# Patient Record
Sex: Male | Born: 1950
Health system: Southern US, Community
[De-identification: ages and names within clinical notes are randomized; demographics above are authoritative.]

## PROBLEM LIST (undated history)

## (undated) DIAGNOSIS — E119 Type 2 diabetes mellitus without complications: Secondary | ICD-10-CM

## (undated) DIAGNOSIS — I714 Abdominal aortic aneurysm, without rupture: Secondary | ICD-10-CM

## (undated) DIAGNOSIS — M86272 Subacute osteomyelitis, left ankle and foot: Secondary | ICD-10-CM

## (undated) DIAGNOSIS — J449 Chronic obstructive pulmonary disease, unspecified: Secondary | ICD-10-CM

## (undated) DIAGNOSIS — E1169 Type 2 diabetes mellitus with other specified complication: Secondary | ICD-10-CM

## (undated) DIAGNOSIS — S2239XA Fracture of one rib, unspecified side, initial encounter for closed fracture: Secondary | ICD-10-CM

## (undated) DIAGNOSIS — F329 Major depressive disorder, single episode, unspecified: Secondary | ICD-10-CM

## (undated) DIAGNOSIS — I10 Essential (primary) hypertension: Secondary | ICD-10-CM

## (undated) DIAGNOSIS — I219 Acute myocardial infarction, unspecified: Secondary | ICD-10-CM

## (undated) DIAGNOSIS — M1990 Unspecified osteoarthritis, unspecified site: Secondary | ICD-10-CM

## (undated) DIAGNOSIS — G4730 Sleep apnea, unspecified: Secondary | ICD-10-CM

## (undated) DIAGNOSIS — F419 Anxiety disorder, unspecified: Secondary | ICD-10-CM

## (undated) DIAGNOSIS — I739 Peripheral vascular disease, unspecified: Secondary | ICD-10-CM

## (undated) DIAGNOSIS — Z9889 Other specified postprocedural states: Secondary | ICD-10-CM

## (undated) DIAGNOSIS — R112 Nausea with vomiting, unspecified: Secondary | ICD-10-CM

## (undated) DIAGNOSIS — E7800 Pure hypercholesterolemia, unspecified: Secondary | ICD-10-CM

## (undated) DIAGNOSIS — F32A Depression, unspecified: Secondary | ICD-10-CM

## (undated) DIAGNOSIS — I509 Heart failure, unspecified: Secondary | ICD-10-CM

## (undated) DIAGNOSIS — K219 Gastro-esophageal reflux disease without esophagitis: Secondary | ICD-10-CM

## (undated) DIAGNOSIS — R569 Unspecified convulsions: Secondary | ICD-10-CM

## (undated) DIAGNOSIS — I639 Cerebral infarction, unspecified: Secondary | ICD-10-CM

## (undated) HISTORY — DX: Subacute osteomyelitis, left ankle and foot: M86.272

## (undated) HISTORY — PX: KNEE SURGERY: SHX244

## (undated) HISTORY — PX: FINGER AMPUTATION: SHX636

## (undated) HISTORY — DX: Fracture of one rib, unspecified side, initial encounter for closed fracture: S22.39XA

## (undated) HISTORY — DX: Peripheral vascular disease, unspecified: I73.9

## (undated) HISTORY — PX: SHOULDER SURGERY: SHX246

## (undated) HISTORY — DX: Type 2 diabetes mellitus with other specified complication: E11.69

## (undated) HISTORY — PX: APPENDECTOMY: SHX54

---
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
## 2002-10-21 DIAGNOSIS — R809 Proteinuria, unspecified: Secondary | ICD-10-CM | POA: Insufficient documentation

## 2002-10-21 HISTORY — DX: Proteinuria, unspecified: R80.9

## 2004-10-08 DIAGNOSIS — IMO0002 Reserved for concepts with insufficient information to code with codable children: Secondary | ICD-10-CM

## 2004-10-08 HISTORY — DX: Reserved for concepts with insufficient information to code with codable children: IMO0002

## 2005-01-07 DIAGNOSIS — F32A Depression, unspecified: Secondary | ICD-10-CM | POA: Insufficient documentation

## 2008-05-02 HISTORY — PX: LOWER EXTREMITY ANGIOGRAM: SHX5955

## 2011-12-06 DIAGNOSIS — H2510 Age-related nuclear cataract, unspecified eye: Secondary | ICD-10-CM

## 2011-12-06 DIAGNOSIS — H04129 Dry eye syndrome of unspecified lacrimal gland: Secondary | ICD-10-CM | POA: Insufficient documentation

## 2011-12-06 HISTORY — DX: Dry eye syndrome of unspecified lacrimal gland: H04.129

## 2011-12-06 HISTORY — DX: Age-related nuclear cataract, unspecified eye: H25.10

## 2013-04-05 DIAGNOSIS — G8929 Other chronic pain: Secondary | ICD-10-CM

## 2013-04-05 HISTORY — DX: Other chronic pain: G89.29

## 2013-08-04 ENCOUNTER — Ambulatory Visit: Payer: Self-pay | Admitting: Cardiovascular Disease

## 2013-09-09 ENCOUNTER — Telehealth: Payer: Self-pay | Admitting: Cardiovascular Disease

## 2013-09-09 NOTE — Telephone Encounter (Signed)
Office was notified via fax that this patient cancelled his appointment with Dr. Gwenlyn Found on 08/04/13---Dr. Archie Endo fax [phone]

## 2014-02-06 DIAGNOSIS — Z0289 Encounter for other administrative examinations: Secondary | ICD-10-CM | POA: Insufficient documentation

## 2014-02-06 HISTORY — DX: Encounter for other administrative examinations: Z02.89

## 2014-09-23 DIAGNOSIS — H5710 Ocular pain, unspecified eye: Secondary | ICD-10-CM | POA: Insufficient documentation

## 2015-01-09 DIAGNOSIS — R5382 Chronic fatigue, unspecified: Secondary | ICD-10-CM | POA: Insufficient documentation

## 2015-01-09 HISTORY — DX: Chronic fatigue, unspecified: R53.82

## 2015-02-06 DIAGNOSIS — R0789 Other chest pain: Secondary | ICD-10-CM | POA: Insufficient documentation

## 2015-02-06 HISTORY — DX: Other chest pain: R07.89

## 2015-03-19 DIAGNOSIS — R634 Abnormal weight loss: Secondary | ICD-10-CM | POA: Insufficient documentation

## 2015-03-19 HISTORY — DX: Abnormal weight loss: R63.4

## 2015-12-19 DIAGNOSIS — J9600 Acute respiratory failure, unspecified whether with hypoxia or hypercapnia: Secondary | ICD-10-CM | POA: Diagnosis not present

## 2015-12-19 DIAGNOSIS — G4733 Obstructive sleep apnea (adult) (pediatric): Secondary | ICD-10-CM | POA: Diagnosis not present

## 2015-12-19 DIAGNOSIS — I5031 Acute diastolic (congestive) heart failure: Secondary | ICD-10-CM | POA: Diagnosis not present

## 2015-12-19 DIAGNOSIS — J449 Chronic obstructive pulmonary disease, unspecified: Secondary | ICD-10-CM | POA: Diagnosis not present

## 2015-12-20 DIAGNOSIS — J9600 Acute respiratory failure, unspecified whether with hypoxia or hypercapnia: Secondary | ICD-10-CM | POA: Diagnosis not present

## 2015-12-21 DIAGNOSIS — E162 Hypoglycemia, unspecified: Secondary | ICD-10-CM

## 2015-12-21 DIAGNOSIS — I2510 Atherosclerotic heart disease of native coronary artery without angina pectoris: Secondary | ICD-10-CM

## 2015-12-21 DIAGNOSIS — E669 Obesity, unspecified: Secondary | ICD-10-CM

## 2015-12-21 DIAGNOSIS — G4733 Obstructive sleep apnea (adult) (pediatric): Secondary | ICD-10-CM

## 2015-12-21 DIAGNOSIS — I5031 Acute diastolic (congestive) heart failure: Secondary | ICD-10-CM

## 2015-12-21 DIAGNOSIS — J9600 Acute respiratory failure, unspecified whether with hypoxia or hypercapnia: Secondary | ICD-10-CM | POA: Diagnosis not present

## 2015-12-21 DIAGNOSIS — F0150 Vascular dementia without behavioral disturbance: Secondary | ICD-10-CM

## 2015-12-21 DIAGNOSIS — J449 Chronic obstructive pulmonary disease, unspecified: Secondary | ICD-10-CM | POA: Diagnosis not present

## 2015-12-21 DIAGNOSIS — E119 Type 2 diabetes mellitus without complications: Secondary | ICD-10-CM

## 2017-06-11 DIAGNOSIS — R0602 Shortness of breath: Secondary | ICD-10-CM | POA: Diagnosis not present

## 2017-06-11 DIAGNOSIS — R079 Chest pain, unspecified: Secondary | ICD-10-CM | POA: Diagnosis not present

## 2017-06-12 DIAGNOSIS — R0602 Shortness of breath: Secondary | ICD-10-CM | POA: Diagnosis not present

## 2017-06-12 DIAGNOSIS — R079 Chest pain, unspecified: Secondary | ICD-10-CM | POA: Diagnosis not present

## 2018-09-28 DIAGNOSIS — I340 Nonrheumatic mitral (valve) insufficiency: Secondary | ICD-10-CM

## 2019-01-16 ENCOUNTER — Inpatient Hospital Stay (HOSPITAL_COMMUNITY)
Admission: EM | Admit: 2019-01-16 | Discharge: 2019-01-20 | DRG: 871 | Disposition: A | Discharge status: Home Health | Payer: No Typology Code available for payment source | Source: Other Acute Inpatient Hospital | Attending: Internal Medicine | Admitting: Internal Medicine

## 2019-01-16 DIAGNOSIS — I161 Hypertensive emergency: Secondary | ICD-10-CM | POA: Diagnosis present

## 2019-01-16 DIAGNOSIS — G8929 Other chronic pain: Secondary | ICD-10-CM | POA: Diagnosis present

## 2019-01-16 DIAGNOSIS — I639 Cerebral infarction, unspecified: Secondary | ICD-10-CM | POA: Diagnosis present

## 2019-01-16 DIAGNOSIS — E11622 Type 2 diabetes mellitus with other skin ulcer: Secondary | ICD-10-CM | POA: Diagnosis not present

## 2019-01-16 DIAGNOSIS — E1140 Type 2 diabetes mellitus with diabetic neuropathy, unspecified: Secondary | ICD-10-CM | POA: Diagnosis present

## 2019-01-16 DIAGNOSIS — L089 Local infection of the skin and subcutaneous tissue, unspecified: Secondary | ICD-10-CM | POA: Diagnosis not present

## 2019-01-16 DIAGNOSIS — E785 Hyperlipidemia, unspecified: Secondary | ICD-10-CM | POA: Diagnosis not present

## 2019-01-16 DIAGNOSIS — I509 Heart failure, unspecified: Secondary | ICD-10-CM | POA: Diagnosis present

## 2019-01-16 DIAGNOSIS — I2510 Atherosclerotic heart disease of native coronary artery without angina pectoris: Secondary | ICD-10-CM | POA: Diagnosis not present

## 2019-01-16 DIAGNOSIS — E11628 Type 2 diabetes mellitus with other skin complications: Secondary | ICD-10-CM | POA: Diagnosis present

## 2019-01-16 DIAGNOSIS — M86272 Subacute osteomyelitis, left ankle and foot: Secondary | ICD-10-CM | POA: Diagnosis not present

## 2019-01-16 DIAGNOSIS — E1169 Type 2 diabetes mellitus with other specified complication: Secondary | ICD-10-CM

## 2019-01-16 DIAGNOSIS — E1165 Type 2 diabetes mellitus with hyperglycemia: Secondary | ICD-10-CM | POA: Diagnosis present

## 2019-01-16 DIAGNOSIS — E871 Hypo-osmolality and hyponatremia: Secondary | ICD-10-CM | POA: Diagnosis not present

## 2019-01-16 DIAGNOSIS — R05 Cough: Secondary | ICD-10-CM | POA: Diagnosis present

## 2019-01-16 DIAGNOSIS — A419 Sepsis, unspecified organism: Principal | ICD-10-CM | POA: Diagnosis present

## 2019-01-16 DIAGNOSIS — Z9282 Status post administration of tPA (rtPA) in a different facility within the last 24 hours prior to admission to current facility: Secondary | ICD-10-CM

## 2019-01-16 DIAGNOSIS — I252 Old myocardial infarction: Secondary | ICD-10-CM

## 2019-01-16 DIAGNOSIS — M869 Osteomyelitis, unspecified: Secondary | ICD-10-CM

## 2019-01-16 DIAGNOSIS — E11621 Type 2 diabetes mellitus with foot ulcer: Secondary | ICD-10-CM | POA: Diagnosis present

## 2019-01-16 DIAGNOSIS — Z794 Long term (current) use of insulin: Secondary | ICD-10-CM

## 2019-01-16 DIAGNOSIS — Z23 Encounter for immunization: Secondary | ICD-10-CM | POA: Diagnosis not present

## 2019-01-16 DIAGNOSIS — F0390 Unspecified dementia without behavioral disturbance: Secondary | ICD-10-CM | POA: Diagnosis not present

## 2019-01-16 DIAGNOSIS — G4730 Sleep apnea, unspecified: Secondary | ICD-10-CM | POA: Diagnosis present

## 2019-01-16 DIAGNOSIS — J449 Chronic obstructive pulmonary disease, unspecified: Secondary | ICD-10-CM | POA: Diagnosis present

## 2019-01-16 DIAGNOSIS — E669 Obesity, unspecified: Secondary | ICD-10-CM | POA: Diagnosis present

## 2019-01-16 DIAGNOSIS — Z79899 Other long term (current) drug therapy: Secondary | ICD-10-CM

## 2019-01-16 DIAGNOSIS — Z87891 Personal history of nicotine dependence: Secondary | ICD-10-CM

## 2019-01-16 DIAGNOSIS — G4733 Obstructive sleep apnea (adult) (pediatric): Secondary | ICD-10-CM | POA: Diagnosis present

## 2019-01-16 DIAGNOSIS — K219 Gastro-esophageal reflux disease without esophagitis: Secondary | ICD-10-CM | POA: Diagnosis present

## 2019-01-16 DIAGNOSIS — L97529 Non-pressure chronic ulcer of other part of left foot with unspecified severity: Secondary | ICD-10-CM | POA: Diagnosis not present

## 2019-01-16 DIAGNOSIS — I6522 Occlusion and stenosis of left carotid artery: Secondary | ICD-10-CM | POA: Diagnosis present

## 2019-01-16 DIAGNOSIS — Z6828 Body mass index (BMI) 28.0-28.9, adult: Secondary | ICD-10-CM

## 2019-01-16 DIAGNOSIS — R32 Unspecified urinary incontinence: Secondary | ICD-10-CM | POA: Diagnosis not present

## 2019-01-16 DIAGNOSIS — Z7902 Long term (current) use of antithrombotics/antiplatelets: Secondary | ICD-10-CM

## 2019-01-16 DIAGNOSIS — Z20828 Contact with and (suspected) exposure to other viral communicable diseases: Secondary | ICD-10-CM | POA: Diagnosis not present

## 2019-01-16 DIAGNOSIS — I714 Abdominal aortic aneurysm, without rupture: Secondary | ICD-10-CM | POA: Diagnosis present

## 2019-01-16 DIAGNOSIS — R569 Unspecified convulsions: Secondary | ICD-10-CM | POA: Diagnosis present

## 2019-01-16 DIAGNOSIS — E7800 Pure hypercholesterolemia, unspecified: Secondary | ICD-10-CM | POA: Diagnosis present

## 2019-01-16 DIAGNOSIS — E1151 Type 2 diabetes mellitus with diabetic peripheral angiopathy without gangrene: Secondary | ICD-10-CM | POA: Diagnosis present

## 2019-01-16 DIAGNOSIS — I110 Hypertensive heart disease with heart failure: Secondary | ICD-10-CM | POA: Diagnosis present

## 2019-01-16 DIAGNOSIS — R4701 Aphasia: Secondary | ICD-10-CM | POA: Diagnosis not present

## 2019-01-16 DIAGNOSIS — I739 Peripheral vascular disease, unspecified: Secondary | ICD-10-CM

## 2019-01-16 DIAGNOSIS — L97509 Non-pressure chronic ulcer of other part of unspecified foot with unspecified severity: Secondary | ICD-10-CM

## 2019-01-16 DIAGNOSIS — D649 Anemia, unspecified: Secondary | ICD-10-CM | POA: Diagnosis present

## 2019-01-16 DIAGNOSIS — Z7982 Long term (current) use of aspirin: Secondary | ICD-10-CM

## 2019-01-16 DIAGNOSIS — R059 Cough, unspecified: Secondary | ICD-10-CM

## 2019-01-16 HISTORY — DX: Pure hypercholesterolemia, unspecified: E78.00

## 2019-01-16 HISTORY — DX: Essential (primary) hypertension: I10

## 2019-01-16 HISTORY — DX: Type 2 diabetes mellitus without complications: E11.9

## 2019-01-16 HISTORY — DX: Chronic obstructive pulmonary disease, unspecified: J44.9

## 2019-01-16 HISTORY — DX: Gastro-esophageal reflux disease without esophagitis: K21.9

## 2019-01-16 HISTORY — DX: Sleep apnea, unspecified: G47.30

## 2019-01-16 NOTE — ED Triage Notes (Signed)
Pt is being transferred from [facility] post Code Stroke. When EMS arrived to the  Patient, they were A/Ox4. At the hospital, patient was not A/O, and was having slurred speech and was not making out complete sentences. CT showed no occlusions and TPA was administered. Rectal temp at faciltiy was 104F and 650mg  Tylenol given Rectal. 102F prior to leaving reported by [facility]. Patient is now waiting for MRI.   Maintain BP 150-180Systolic  COVID Negative  Diabetic Ulcer to the Right Foot CT showed Anerysum behind Kidney after TPA Given

## 2019-01-16 NOTE — ED Notes (Signed)
Please call wife Danny Carpenter  @ [phone] a status update--Danny Carpenter

## 2019-01-17 ENCOUNTER — Inpatient Hospital Stay (HOSPITAL_COMMUNITY): Payer: No Typology Code available for payment source

## 2019-01-17 ENCOUNTER — Encounter (HOSPITAL_COMMUNITY): Payer: Self-pay | Admitting: Neurology

## 2019-01-17 ENCOUNTER — Inpatient Hospital Stay (HOSPITAL_COMMUNITY): Payer: Medicare (Managed Care)

## 2019-01-17 DIAGNOSIS — Z20828 Contact with and (suspected) exposure to other viral communicable diseases: Secondary | ICD-10-CM | POA: Diagnosis present

## 2019-01-17 DIAGNOSIS — R4701 Aphasia: Secondary | ICD-10-CM | POA: Diagnosis present

## 2019-01-17 DIAGNOSIS — I2510 Atherosclerotic heart disease of native coronary artery without angina pectoris: Secondary | ICD-10-CM

## 2019-01-17 DIAGNOSIS — E1169 Type 2 diabetes mellitus with other specified complication: Secondary | ICD-10-CM | POA: Diagnosis present

## 2019-01-17 DIAGNOSIS — Z7902 Long term (current) use of antithrombotics/antiplatelets: Secondary | ICD-10-CM

## 2019-01-17 DIAGNOSIS — Z87891 Personal history of nicotine dependence: Secondary | ICD-10-CM

## 2019-01-17 DIAGNOSIS — Z23 Encounter for immunization: Secondary | ICD-10-CM | POA: Diagnosis not present

## 2019-01-17 DIAGNOSIS — G9349 Other encephalopathy: Secondary | ICD-10-CM | POA: Diagnosis not present

## 2019-01-17 DIAGNOSIS — I639 Cerebral infarction, unspecified: Secondary | ICD-10-CM

## 2019-01-17 DIAGNOSIS — I252 Old myocardial infarction: Secondary | ICD-10-CM | POA: Diagnosis not present

## 2019-01-17 DIAGNOSIS — M869 Osteomyelitis, unspecified: Secondary | ICD-10-CM | POA: Diagnosis present

## 2019-01-17 DIAGNOSIS — G8929 Other chronic pain: Secondary | ICD-10-CM

## 2019-01-17 DIAGNOSIS — J449 Chronic obstructive pulmonary disease, unspecified: Secondary | ICD-10-CM | POA: Diagnosis present

## 2019-01-17 DIAGNOSIS — E785 Hyperlipidemia, unspecified: Secondary | ICD-10-CM | POA: Diagnosis present

## 2019-01-17 DIAGNOSIS — E1165 Type 2 diabetes mellitus with hyperglycemia: Secondary | ICD-10-CM | POA: Diagnosis present

## 2019-01-17 DIAGNOSIS — Z794 Long term (current) use of insulin: Secondary | ICD-10-CM

## 2019-01-17 DIAGNOSIS — R569 Unspecified convulsions: Secondary | ICD-10-CM | POA: Diagnosis present

## 2019-01-17 DIAGNOSIS — I10 Essential (primary) hypertension: Secondary | ICD-10-CM

## 2019-01-17 DIAGNOSIS — E11622 Type 2 diabetes mellitus with other skin ulcer: Secondary | ICD-10-CM | POA: Diagnosis present

## 2019-01-17 DIAGNOSIS — I6522 Occlusion and stenosis of left carotid artery: Secondary | ICD-10-CM | POA: Diagnosis present

## 2019-01-17 DIAGNOSIS — R509 Fever, unspecified: Secondary | ICD-10-CM | POA: Diagnosis not present

## 2019-01-17 DIAGNOSIS — Z01810 Encounter for preprocedural cardiovascular examination: Secondary | ICD-10-CM | POA: Diagnosis not present

## 2019-01-17 DIAGNOSIS — M86272 Subacute osteomyelitis, left ankle and foot: Secondary | ICD-10-CM | POA: Diagnosis present

## 2019-01-17 DIAGNOSIS — R32 Unspecified urinary incontinence: Secondary | ICD-10-CM | POA: Diagnosis present

## 2019-01-17 DIAGNOSIS — I6389 Other cerebral infarction: Secondary | ICD-10-CM | POA: Diagnosis not present

## 2019-01-17 DIAGNOSIS — E11628 Type 2 diabetes mellitus with other skin complications: Secondary | ICD-10-CM | POA: Diagnosis present

## 2019-01-17 DIAGNOSIS — L089 Local infection of the skin and subcutaneous tissue, unspecified: Secondary | ICD-10-CM

## 2019-01-17 DIAGNOSIS — Z79899 Other long term (current) drug therapy: Secondary | ICD-10-CM

## 2019-01-17 DIAGNOSIS — A419 Sepsis, unspecified organism: Secondary | ICD-10-CM | POA: Diagnosis present

## 2019-01-17 DIAGNOSIS — E1140 Type 2 diabetes mellitus with diabetic neuropathy, unspecified: Secondary | ICD-10-CM

## 2019-01-17 DIAGNOSIS — G4733 Obstructive sleep apnea (adult) (pediatric): Secondary | ICD-10-CM | POA: Diagnosis present

## 2019-01-17 DIAGNOSIS — I739 Peripheral vascular disease, unspecified: Secondary | ICD-10-CM

## 2019-01-17 DIAGNOSIS — E11621 Type 2 diabetes mellitus with foot ulcer: Secondary | ICD-10-CM | POA: Diagnosis not present

## 2019-01-17 DIAGNOSIS — Z79891 Long term (current) use of opiate analgesic: Secondary | ICD-10-CM

## 2019-01-17 DIAGNOSIS — F0390 Unspecified dementia without behavioral disturbance: Secondary | ICD-10-CM | POA: Diagnosis present

## 2019-01-17 DIAGNOSIS — E871 Hypo-osmolality and hyponatremia: Secondary | ICD-10-CM | POA: Diagnosis present

## 2019-01-17 DIAGNOSIS — Z9862 Peripheral vascular angioplasty status: Secondary | ICD-10-CM | POA: Diagnosis not present

## 2019-01-17 DIAGNOSIS — Z7951 Long term (current) use of inhaled steroids: Secondary | ICD-10-CM

## 2019-01-17 DIAGNOSIS — I161 Hypertensive emergency: Secondary | ICD-10-CM | POA: Diagnosis present

## 2019-01-17 DIAGNOSIS — E1151 Type 2 diabetes mellitus with diabetic peripheral angiopathy without gangrene: Secondary | ICD-10-CM

## 2019-01-17 DIAGNOSIS — R05 Cough: Secondary | ICD-10-CM | POA: Diagnosis present

## 2019-01-17 DIAGNOSIS — L97529 Non-pressure chronic ulcer of other part of left foot with unspecified severity: Secondary | ICD-10-CM | POA: Diagnosis present

## 2019-01-17 HISTORY — DX: Type 2 diabetes mellitus with other skin complications: L08.9

## 2019-01-17 LAB — CBC WITH DIFFERENTIAL/PLATELET
Abs Immature Granulocytes: 0.03 10*3/uL (ref 0.00–0.07)
Basophils Absolute: 0 10*3/uL (ref 0.0–0.1)
Basophils Relative: 1 %
Eosinophils Absolute: 0 10*3/uL (ref 0.0–0.5)
Eosinophils Relative: 1 %
HCT: 37.3 % — ABNORMAL LOW (ref 39.0–52.0)
Hemoglobin: 12.5 g/dL — ABNORMAL LOW (ref 13.0–17.0)
Immature Granulocytes: 0 %
Lymphocytes Relative: 16 %
Lymphs Abs: 1.1 10*3/uL (ref 0.7–4.0)
MCH: 29.8 pg (ref 26.0–34.0)
MCHC: 33.5 g/dL (ref 30.0–36.0)
MCV: 89 fL (ref 80.0–100.0)
Monocytes Absolute: 0.6 10*3/uL (ref 0.1–1.0)
Monocytes Relative: 9 %
Neutro Abs: 5.1 10*3/uL (ref 1.7–7.7)
Neutrophils Relative %: 73 %
Platelets: 149 10*3/uL — ABNORMAL LOW (ref 150–400)
RBC: 4.19 MIL/uL — ABNORMAL LOW (ref 4.22–5.81)
RDW: 13.2 % (ref 11.5–15.5)
WBC: 6.8 10*3/uL (ref 4.0–10.5)
nRBC: 0 % (ref 0.0–0.2)

## 2019-01-17 LAB — BASIC METABOLIC PANEL
Anion gap: 13 (ref 5–15)
BUN: 16 mg/dL (ref 8–23)
CO2: 24 mmol/L (ref 22–32)
Calcium: 9 mg/dL (ref 8.9–10.3)
Chloride: 93 mmol/L — ABNORMAL LOW (ref 98–111)
Creatinine, Ser: 1.22 mg/dL (ref 0.61–1.24)
GFR calc Af Amer: >60 mL/min (ref >60)
GFR calc non Af Amer: >60 mL/min (ref >60)
Glucose, Bld: 282 mg/dL — ABNORMAL HIGH (ref 70–99)
Potassium: 3.9 mmol/L (ref 3.5–5.1)
Sodium: 130 mmol/L — ABNORMAL LOW (ref 135–145)

## 2019-01-17 LAB — URINALYSIS, COMPLETE (UACMP) WITH MICROSCOPIC
Bacteria, UA: NONE SEEN
Bilirubin Urine: NEGATIVE
Glucose, UA: >=500 mg/dL — AB
Hgb urine dipstick: NEGATIVE
Ketones, ur: NEGATIVE mg/dL
Leukocytes,Ua: NEGATIVE
Nitrite: NEGATIVE
Protein, ur: 30 mg/dL — AB
Specific Gravity, Urine: 1.009 (ref 1.005–1.030)
pH: 7 (ref 5.0–8.0)

## 2019-01-17 LAB — GLUCOSE, CAPILLARY
Glucose-Capillary: 280 mg/dL — ABNORMAL HIGH (ref 70–99)
Glucose-Capillary: 263 mg/dL — ABNORMAL HIGH (ref 70–99)
Glucose-Capillary: 179 mg/dL — ABNORMAL HIGH (ref 70–99)
Glucose-Capillary: 139 mg/dL — ABNORMAL HIGH (ref 70–99)
Glucose-Capillary: 186 mg/dL — ABNORMAL HIGH (ref 70–99)
Glucose-Capillary: 267 mg/dL — ABNORMAL HIGH (ref 70–99)

## 2019-01-17 LAB — ECHOCARDIOGRAM COMPLETE
Height: 73 in
Weight: 3241.64 oz

## 2019-01-17 LAB — LIPID PANEL
Cholesterol: 131 mg/dL (ref 0–200)
HDL: 35 mg/dL — ABNORMAL LOW (ref >40)
LDL Cholesterol: 70 mg/dL (ref 0–99)
Total CHOL/HDL Ratio: 3.7 RATIO
Triglycerides: 130 mg/dL (ref <150)
VLDL: 26 mg/dL (ref 0–40)

## 2019-01-17 LAB — RAPID URINE DRUG SCREEN, HOSP PERFORMED
Amphetamines: NOT DETECTED
Barbiturates: NOT DETECTED
Benzodiazepines: NOT DETECTED
Cocaine: NOT DETECTED
Opiates: POSITIVE — AB
Tetrahydrocannabinol: NOT DETECTED

## 2019-01-17 LAB — MRSA PCR SCREENING: MRSA by PCR: NEGATIVE

## 2019-01-17 LAB — HIV ANTIBODY (ROUTINE TESTING W REFLEX): HIV Screen 4th Generation wRfx: NONREACTIVE

## 2019-01-17 MED ORDER — ASPIRIN 325 MG PO TABS
325.0000 mg | ORAL_TABLET | Freq: Every day | ORAL | Status: DC
  Administered 2019-01-17: 22:00:00 325 mg via ORAL
  Filled 2019-01-17: qty 1

## 2019-01-17 MED ORDER — ACETAMINOPHEN 650 MG RE SUPP: 650.0000 mg | RECTAL | Status: DC | PRN

## 2019-01-17 MED ORDER — GABAPENTIN 400 MG PO CAPS
800.0000 mg | ORAL_CAPSULE | Freq: Three times a day (TID) | ORAL | Status: DC
  Administered 2019-01-17 (×3): 800 mg via ORAL
  Filled 2019-01-17 (×3): qty 2

## 2019-01-17 MED ORDER — PANTOPRAZOLE SODIUM 40 MG IV SOLR
40.0000 mg | Freq: Every day | INTRAVENOUS | Status: DC
  Administered 2019-01-17: 01:00:00 40 mg via INTRAVENOUS
  Filled 2019-01-17: qty 40

## 2019-01-17 MED ORDER — INSULIN GLARGINE 100 UNIT/ML ~~LOC~~ SOLN
10.0000 [IU] | Freq: Every day | SUBCUTANEOUS | Status: DC
  Administered 2019-01-17 – 2019-01-18 (×2): 10 [IU] via SUBCUTANEOUS
  Filled 2019-01-17 (×2): qty 0.1

## 2019-01-17 MED ORDER — LABETALOL HCL 5 MG/ML IV SOLN
10.0000 mg | INTRAVENOUS | Status: DC | PRN
  Administered 2019-01-17: 22:00:00 20 mg via INTRAVENOUS
  Filled 2019-01-17: qty 4

## 2019-01-17 MED ORDER — PERFLUTREN LIPID MICROSPHERE
1.0000 mL | INTRAVENOUS | Status: AC | PRN
  Administered 2019-01-17: 16:00:00 4 mL via INTRAVENOUS
  Filled 2019-01-17: qty 10

## 2019-01-17 MED ORDER — PANTOPRAZOLE SODIUM 40 MG PO TBEC
40.0000 mg | DELAYED_RELEASE_TABLET | Freq: Every day | ORAL | Status: DC
  Administered 2019-01-17 – 2019-01-20 (×3): 40 mg via ORAL
  Filled 2019-01-17 (×3): qty 1

## 2019-01-17 MED ORDER — STROKE: EARLY STAGES OF RECOVERY BOOK
Freq: Once | Status: DC
  Filled 2019-01-17: qty 1

## 2019-01-17 MED ORDER — INSULIN ASPART 100 UNIT/ML ~~LOC~~ SOLN
0.0000 [IU] | Freq: Three times a day (TID) | SUBCUTANEOUS | Status: DC
  Administered 2019-01-17: 17:00:00 3 [IU] via SUBCUTANEOUS
  Administered 2019-01-17: 8 [IU] via SUBCUTANEOUS

## 2019-01-17 MED ORDER — ACETAMINOPHEN 325 MG PO TABS
650.0000 mg | ORAL_TABLET | ORAL | Status: DC | PRN
  Administered 2019-01-17 – 2019-01-19 (×4): 650 mg via ORAL
  Filled 2019-01-17 (×4): qty 2

## 2019-01-17 MED ORDER — INSULIN ASPART 100 UNIT/ML ~~LOC~~ SOLN
0.0000 [IU] | SUBCUTANEOUS | Status: DC
  Administered 2019-01-17: 13:00:00 2 [IU] via SUBCUTANEOUS
  Administered 2019-01-17: 3 [IU] via SUBCUTANEOUS
  Administered 2019-01-17: 05:00:00 8 [IU] via SUBCUTANEOUS

## 2019-01-17 MED ORDER — LEVETIRACETAM 500 MG PO TABS
500.0000 mg | ORAL_TABLET | Freq: Two times a day (BID) | ORAL | Status: DC
  Administered 2019-01-17 – 2019-01-20 (×7): 500 mg via ORAL
  Filled 2019-01-17 (×7): qty 1

## 2019-01-17 MED ORDER — MENTHOL 3 MG MT LOZG
1.0000 | LOZENGE | OROMUCOSAL | Status: DC | PRN
  Administered 2019-01-17 (×2): 3 mg via ORAL
  Filled 2019-01-17 (×2): qty 9

## 2019-01-17 MED ORDER — LISINOPRIL 20 MG PO TABS: 40.0000 mg | ORAL_TABLET | Freq: Every day | ORAL | Status: DC

## 2019-01-17 MED ORDER — GADOBUTROL 1 MMOL/ML IV SOLN
9.0000 mL | Freq: Once | INTRAVENOUS | Status: AC | PRN
  Administered 2019-01-17: 20:00:00 9 mL via INTRAVENOUS

## 2019-01-17 MED ORDER — VANCOMYCIN HCL 10 G IV SOLR
2000.0000 mg | Freq: Once | INTRAVENOUS | Status: AC
  Administered 2019-01-17: 2000 mg via INTRAVENOUS
  Filled 2019-01-17: qty 2000

## 2019-01-17 MED ORDER — SODIUM CHLORIDE 0.9 % IV SOLN
2.0000 g | Freq: Three times a day (TID) | INTRAVENOUS | Status: DC
  Administered 2019-01-17 – 2019-01-19 (×8): 2 g via INTRAVENOUS
  Filled 2019-01-17 (×11): qty 2

## 2019-01-17 MED ORDER — SODIUM CHLORIDE 0.9 % IV SOLN
2.0000 g | Freq: Once | INTRAVENOUS | Status: AC
  Administered 2019-01-17: 2 g via INTRAVENOUS
  Filled 2019-01-17: qty 2

## 2019-01-17 MED ORDER — LEVETIRACETAM IN NACL 1000 MG/100ML IV SOLN
1000.0000 mg | Freq: Once | INTRAVENOUS | Status: AC
  Administered 2019-01-17: 1000 mg via INTRAVENOUS
  Filled 2019-01-17: qty 100

## 2019-01-17 MED ORDER — CHLORHEXIDINE GLUCONATE CLOTH 2 % EX PADS
6.0000 | MEDICATED_PAD | Freq: Every day | CUTANEOUS | Status: DC
  Administered 2019-01-18 – 2019-01-20 (×2): 6 via TOPICAL

## 2019-01-17 MED ORDER — ACETAMINOPHEN 160 MG/5ML PO SOLN: 650.0000 mg | ORAL | Status: DC | PRN

## 2019-01-17 MED ORDER — SODIUM CHLORIDE 0.9 % IV SOLN
50.0000 mL/h | INTRAVENOUS | Status: DC
  Administered 2019-01-17: 50 mL/h via INTRAVENOUS

## 2019-01-17 MED ORDER — VANCOMYCIN HCL 10 G IV SOLR
1250.0000 mg | Freq: Two times a day (BID) | INTRAVENOUS | Status: DC
  Administered 2019-01-17 – 2019-01-19 (×4): 1250 mg via INTRAVENOUS
  Filled 2019-01-17 (×6): qty 1250

## 2019-01-17 MED ORDER — NICARDIPINE HCL IN NACL 20-0.86 MG/200ML-% IV SOLN
0.0000 mg/h | INTRAVENOUS | Status: DC | PRN
  Administered 2019-01-17: 01:00:00 12.5 mg/h via INTRAVENOUS

## 2019-01-17 MED ORDER — MORPHINE SULFATE ER 15 MG PO TBCR
15.0000 mg | EXTENDED_RELEASE_TABLET | Freq: Two times a day (BID) | ORAL | Status: DC
  Administered 2019-01-17 – 2019-01-20 (×5): 15 mg via ORAL
  Filled 2019-01-17 (×5): qty 1

## 2019-01-17 MED ORDER — AMLODIPINE BESYLATE 10 MG PO TABS
10.0000 mg | ORAL_TABLET | Freq: Every day | ORAL | Status: DC
  Administered 2019-01-17 – 2019-01-20 (×4): 10 mg via ORAL
  Filled 2019-01-17 (×4): qty 1

## 2019-01-17 MED ORDER — ATORVASTATIN CALCIUM 40 MG PO TABS: 40.0000 mg | ORAL_TABLET | Freq: Every day | ORAL | Status: DC

## 2019-01-17 MED ORDER — INFLUENZA VAC A&B SA ADJ QUAD 0.5 ML IM PRSY
0.5000 mL | PREFILLED_SYRINGE | INTRAMUSCULAR | Status: DC
  Filled 2019-01-17: qty 0.5

## 2019-01-17 NOTE — H&P (Addendum)
Neurology H&P  CC: Aphasia  History is obtained from: Patient  HPI: Danny Carpenter is a 68 y.o. male with a history of diabetes, htn, hyperlipidemia who was in his normal state of health until 5:30 pm.  At that time he had a rather abrupt change in mental status.  I was not able to get a firsthand account, but I got a secondhand account through his daughter who spoke with the son who witnessed the event.  Apparently he was tremoring all over "like he was cold" he urinated on himself and he was taken to [facility] ER.    There he was found to be globally aphasic and was treated with IV TPA.  He was transferred to [facility] management.  He has been evaluated for a wound to his left foot and has to start seeing a wound care clinic and states that he was supposed to start on antibiotics soon, but they had not started it yet. He has been feeling  He was noted to be febrile in the ED.    LKW: 5:30 PM tpa given?:  Yes IR Thrombectomy? No, no LVO   ROS: A complete ROS was performed and is negative except as noted in the HPI.   PMHx: HTN High cholesterol Heart Attack COPD  Early dementia  Social History: Quit smoking 10 years ago, denies etoh  Prior to Admission medications   Not on File  Albuterol Labetalol 200mg  PO BID Lisinopril 40mg  daily plavix 75mg  qd Fluticasone/Vilanterol 100-25 Gabapentin 800mg  TID norco 7.5 qid prn Insulin ? Pravastatin 40mg  qd Spironolactone 25mg  qd  Pantoprazole 40mg  daily   Exam: Current vital signs: BP (!) 150/85 (BP Location: Left Arm)   Pulse 90   Temp (!) 101.3 F (38.5 C) (Rectal)   Ht 6\' 1"  (1.854 m)   Wt 99.3 kg   SpO2 96%   BMI 28.89 kg/m    Physical Exam  Constitutional: Appears well-developed and well-nourished.  Psych: Affect appropriate to situation Eyes: No scleral injection HENT: No OP obstrucion Head: Normocephalic.  Cardiovascular: Normal rate and regular rhythm.  Respiratory: Effort normal and breath  sounds normal to anterior ascultation GI: Soft.  No distension. There is no tenderness.  Skin: Ulcer with some necrosis on the bottom of the left foot  Neuro: Mental Status: Patient is awake, alert, oriented to person, place, month, year, and situation. Patient is able to give a clear and coherent history. No signs of aphasia or neglect Cranial Nerves: II: Visual Fields are full. Pupils are equal, round, and reactive to light.   III,IV, VI: EOMI without ptosis or diploplia.  V: Facial sensation is symmetric to temperature VII: Facial movement is symmetric.  VIII: hearing is intact to voice X: Uvula elevates symmetrically XI: Shoulder shrug is symmetric. XII: tongue is midline without atrophy or fasciculations.  Motor: Tone is normal. Bulk is normal. 5/5 strength was present in all four extremities.  Sensory: Sensation is symmetric to light touch and temperature in the arms and legs. Cerebellar: FNF intact bilaterally   I have reviewed labs in epic and the pertinent results are: Sodium 126 Potassium 4.6 Chloride 92 Carbon dioxide 30 Anion gap 12 BUN 18 Creatinine 1.1 Glucose 236 Calcium 9.0 CRP 45  UA-negative  No leukocytosis, WBC 6.4   I have reviewed the image reports:  CT head-no acute findings CTA head- vertebral stenosis as well as greater than 70% stenosis of the left ICA  Primary Diagnosis:  Cerebral infarction, unspecified.  Secondary Diagnosis: Hypertension Emergency (SBP > 180 or DBP > 120 & end organ damage), Type 2 diabetes mellitus with hyperglycemia  and Hyponatremia  Impression: 68 year old male with transient episode of aphasia.  The spell that was described by his family does raise the possibility that this represents seizure, though without a firsthand account I am not certain of this.  I will give a dose of Keppra but since this was new onset, this does not necessarily need to be long-term especially since he is already on another antiepileptic  (gabapentin).   It is unclear to me exactly where the fever is coming from currently, but I suspect that his foot ulcer may be involved.  Plan: - HgbA1c, fasting lipid panel - MRI  of the brain without contrast - Frequent neuro checks - Echocardiogram - Carotid dopplers - Prophylactic therapy-none for 24 hours.  - Risk factor modification - Telemetry monitoring - PT consult, OT consult, Speech consult - Stroke team to follow  Fever/ulcer Consult internal medicine for antibiotic management Blood cultures  Diabetes: Sliding scale insulin, appreciate internal medicine assistance  Hypertension Hold home anti-hypertensives Cardene PRN  This patient is critically ill and at significant risk of neurological worsening, death and care requires constant monitoring of vital signs, hemodynamics,respiratory and cardiac monitoring, neurological assessment, discussion with family, other specialists and medical decision making of high complexity. I spent 50 minutes of neurocritical care time  in the care of  this patient. This was time spent independent of any time provided by nurse practitioner or PA.  Roland Rack, MD [facility] [phone]  If 7pm- 7am, please page neurology on call as listed in [place].

## 2019-01-17 NOTE — Progress Notes (Signed)
ABI's have been completed. Preliminary results can be found in CV Proc through chart review.   01/17/19 10:26 AM Danny Carpenter RVT

## 2019-01-17 NOTE — Procedures (Signed)
Patient Name: Danny Carpenter  MRN: [id number]  Epilepsy Attending: Lora Havens  Referring Physician/Provider: Dr Roland Rack Date: 01/17/2019 Duration: 23.18 mins  Patient history: 68yo m with [name]. EEG to evaluate for seizure.  Level of alertness:  Awake, drowsy  AEDs during EEG study: LEV, GBP  Technical aspects: This EEG study was done with scalp electrodes positioned according to the 10-20 International system of electrode placement. Electrical activity was acquired at a sampling rate of 500Hz  and reviewed with a high frequency filter of 70Hz  and a low frequency filter of 1Hz . EEG data were recorded continuously and digitally stored.   DESCRIPTION: The posterior dominant rhythm consists of 7-8 Hz activity of moderate voltage (25-35 uV) seen predominantly in posterior head regions, symmetric and reactive to eye opening and eye closing. Drowsiness was characterized by attenuation of the posterior background rhythm. Intermittent 2-3Hz  delta slowing was seen in left temporal region. Hyperventilation and photic stimulation were not performed.  Abnormality - Intermittent slow, left temporal  IMPRESSION: This study is suggestive of non specific cortical dysfunction in left temporal region. No seizures or epileptiform discharges were seen throughout the recording.  Ahlani Wickes Barbra Sarks

## 2019-01-17 NOTE — Progress Notes (Signed)
PT Cancellation Note  Patient Details Name: Danny Carpenter MRN: [id number] DOB: [birth year]   Cancelled Treatment:    Reason Eval/Treat Not Completed: Active bedrest order  Per RN, they are estimating tPA given at 9:00 pm (given at [facility] Will defer PT eval to 01/18/19.     Barry Brunner, PT      Rexanne Mano 01/17/2019, 11:34 AM
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
## 2019-01-17 NOTE — ED Notes (Addendum)
Cardene Infusion 12.79ml/hr Vancomycin Infusion 249ml/hr

## 2019-01-17 NOTE — Progress Notes (Signed)
Called daughter, Glenard Haring, to inquire what BP medications the pt takes at home. Will email list shortly.    Medications that are new and not listed in the email are:  Morphine 15 mg pill po BID (9a & 9p) 22 units insulin q am (daughter unsure about which type of insulin)  Does not take copd inhaler on the list that was emailed.  Printed copies of his med list were given to the pharmacy tech and one was placed on chart.

## 2019-01-17 NOTE — Progress Notes (Signed)
VASCULAR LAB PRELIMINARY  PRELIMINARY  PRELIMINARY  PRELIMINARY  Carotid duplex completed.    Preliminary report:  See CV proc for preliminary results.   Karima Carrell, RVT 01/17/2019, 2:39 PM

## 2019-01-17 NOTE — Progress Notes (Signed)
Patient transported to MRI in wheelchair off of tele per order. Will go with out nurse.

## 2019-01-17 NOTE — Progress Notes (Signed)
Per transfer documentation pt received TPA at [facility] from [phone] [id number].

## 2019-01-17 NOTE — ED Provider Notes (Signed)
Emergency Department Provider Note   I have reviewed the triage vital signs and the nursing notes.   HISTORY  Chief Complaint Code Stroke   HPI Danny Carpenter is a 68 y.o. male who presents from [facility] emergency department having had TPA, antibiotics and on Narcan prior to pain.  Patient had a episode of altered mental status and difficulty speaking today after unconsciousness with urinary incontinence.  They were concerned for stroke via teleneurologist so was given TPA and start nicardipine.  Then found to have a temperature of 104 so septic work-up was initiated with vancomycin cefepime and fluids.  Sent here for further evaluation.  Patient does not remember the event.  At this time patient feels okay besides being low but warm and sweaty.   No other associated or modifying symptoms.    Past Medical History:  Diagnosis Date  . COPD (chronic obstructive pulmonary disease) ([place])   . DM2 (diabetes mellitus, type 2) ([place])   . GERD (gastroesophageal reflux disease)   . Heart attack ([place])   . HTN (hypertension)   . Hypercholesteremia   . Sleep apnea     Patient Active Problem List   Diagnosis Date Noted  . CVA (cerebral vascular accident) ([place]) 01/17/2019    Allergies Patient has no known allergies.  No family history on file.  Social History Social History   Tobacco Use  . Smoking status: Not on file  Substance Use Topics  . Alcohol use: Not on file  . Drug use: Not on file    Review of Systems  All other systems negative except as documented in the HPI. All pertinent positives and negatives as reviewed in the HPI. ____________________________________________   PHYSICAL EXAM:  VITAL SIGNS: ED Triage Vitals  Enc Vitals Group     BP 01/17/19 0008 (!) 150/85     Pulse Rate 01/17/19 0008 90     Resp 01/17/19 0015 (!) 22     Temp 01/17/19 0008 99.1 F (37.3 C)     Temp Source 01/17/19 0008 Oral     SpO2 01/17/19 0015 97 %     Weight 01/17/19 0009 219  lb (99.3 kg)     Height 01/17/19 0009 6\' 1"  (1.854 m)    Constitutional: Alert and oriented. Well appearing and in no acute distress. Eyes: Conjunctivae are normal. PERRL. EOMI. Head: Atraumatic. Nose: No congestion/rhinnorhea. Mouth/Throat: Mucous membranes are moist.  Oropharynx non-erythematous. Neck: No stridor.  No meningeal signs.   Cardiovascular: Normal rate, regular rhythm. Good peripheral circulation. Grossly normal heart sounds.   Respiratory: Normal respiratory effort.  No retractions. Lungs CTAB. Gastrointestinal: Soft and nontender. No distention.  Musculoskeletal: No lower extremity tenderness nor edema. No gross deformities of extremities. Neurologic:  No altered mental status, able to give full seemingly accurate history.  Face is symmetric, EOM's intact, pupils equal and reactive, vision intact, tongue and uvula midline without deviation. Upper and Lower extremity motor 5/5, intact pain perception in distal extremities, 2+ reflexes in biceps, patella and achilles tendons. Able to perform finger to nose normal with both hands.  Skin:  Skin is warm, diaphoretic and intact. No rash noted.   ____________________________________________   LABS (all labs ordered are listed, but only abnormal results are displayed)  Labs Reviewed  CULTURE, BLOOD (ROUTINE X 2)  CULTURE, BLOOD (ROUTINE X 2)  URINE CULTURE  HIV ANTIBODY (ROUTINE TESTING W REFLEX)  HIV4GL SAVE TUBE  CBC  HEMOGLOBIN A1C  LIPID PANEL  URINALYSIS, ROUTINE W REFLEX MICROSCOPIC  CBC WITH DIFFERENTIAL/PLATELET  BASIC METABOLIC PANEL   ____________________________________________  EKG   EKG Interpretation  Date/Time:  Sunday January 17 2019 00:02:41 EDT Ventricular Rate:  91 PR Interval:    QRS Duration: 103 QT Interval:  339 QTC Calculation: 417 R Axis:   -160 Text Interpretation:  Right and left arm electrode reversal, interpretation assumes no reversal Sinus or ectopic atrial rhythm Multiple  ventricular premature complexes Prolonged PR interval Probable lateral infarct, age indeterminate Baseline wander in lead(s) V4 Confirmed by Merrily Pew [phone]) on 01/17/2019 1:29:44 AM       ____________________________________________  RADIOLOGY  No results found.  ____________________________________________   PROCEDURES  Procedure(s) performed:   .Critical Care Performed by: Merrily Pew, MD Authorized by: Merrily Pew, MD   Critical care provider statement:    Critical care time (minutes):  45   Critical care was necessary to treat or prevent imminent or life-threatening deterioration of the following conditions:  Sepsis and CNS failure or compromise   Critical care was time spent personally by me on the following activities:  Discussions with consultants, evaluation of patient's response to treatment, examination of patient, ordering and performing treatments and interventions, ordering and review of laboratory studies, ordering and review of radiographic studies, pulse oximetry, re-evaluation of patient's condition, obtaining history from patient or surrogate and review of old charts  ____________________________________________   INITIAL IMPRESSION / [place] / ED COURSE  Nicardipine running. Vancomycin running. Appears to be septic but already had full workup for same. Has some small amount of bruising especially on arms, likely from tPA, no new headache or neuro deficits TO SUGGEST hemorrhagic conversion. Sepsis likely from wound on foot and cellulitis associated with it. Workup from osh reviewed and included normal wbc, hb, bmp pending. Normal ct/cta of head. Nicardipine continued for BP <180  Neurology consulted and will admit.   Pertinent labs & imaging results that were available during my care of the patient were reviewed by me and considered in my medical decision making (see chart for details). ____________________________________________  FINAL  CLINICAL IMPRESSION(S) / ED DIAGNOSES  Final diagnoses:  Cough  Sepsis, due to unspecified organism, unspecified whether acute organ dysfunction present [facility])     MEDICATIONS GIVEN DURING THIS VISIT:  Medications   stroke: mapping our early stages of recovery book (has no administration in time range)  0.9 %  sodium chloride infusion (50 mL/hr Intravenous New Bag/Given 01/17/19 0100)  acetaminophen (TYLENOL) tablet 650 mg (has no administration in time range)    Or  acetaminophen (TYLENOL) solution 650 mg (has no administration in time range)    Or  acetaminophen (TYLENOL) suppository 650 mg (has no administration in time range)  pantoprazole (PROTONIX) injection 40 mg (40 mg Intravenous Given 01/17/19 0100)  nicardipine (CARDENE) 20mg  in 0.86% saline 273ml IV infusion (0.1 mg/ml) (0 mg/hr Intravenous Paused 01/17/19 0122)  insulin aspart (novoLOG) injection 0-15 Units (has no administration in time range)  levETIRAcetam (KEPPRA) IVPB 1000 mg/100 mL premix (0 mg Intravenous Stopped 01/17/19 0121)     NEW OUTPATIENT MEDICATIONS STARTED DURING THIS VISIT:  New Prescriptions   No medications on file    Note:  This note was prepared with assistance of Dragon voice recognition software. Occasional wrong-word or sound-a-like substitutions may have occurred due to the inherent limitations of voice recognition software.   Rainbow Salman, Corene Cornea, MD 01/17/19 [phone]

## 2019-01-17 NOTE — Progress Notes (Signed)
Pharmacy Antibiotic Note  Danny Carpenter is a 69 y.o. male admitted on 01/16/2019 with osteomyelitis.  Pharmacy has been consulted for vancomycin and cefepime dosing.  Per pt wound care clinic was supposed to start ABX but they had not been started yet.  Plan: Vancomycin 2000mg  x1 then 1250mg  IV Q12H. Goal AUC 400-550.  Expected AUC 525.  SCr used 1.1.  Cefepime 2g IV Q8H.  Height: 6\' 1"  (185.4 cm) Weight: 219 lb (99.3 kg) IBW/kg (Calculated) : 79.9  Temp (24hrs), Avg:99.6 F (37.6 C), Min:98.3 F (36.8 C), Max:101.3 F (38.5 C)   No Known Allergies  Thank you for allowing pharmacy to be a part of this patient's care.  Wynona Neat, PharmD, BCPS  01/17/2019 2:42 AM

## 2019-01-17 NOTE — ED Notes (Signed)
Vancomycin Infusion Complete

## 2019-01-17 NOTE — Progress Notes (Signed)
STROKE TEAM PROGRESS NOTE   INTERVAL HISTORY His RN is at the bedside.  Pt seems to be back to his baseline. Now fever since admission. Pt can not remember what happened yesterday. He stated that his granddaughter told him that he was sitting at the side of bed, suddenly started to shake all over and he slipped down to floor. He also had urinary incontinence as his pants were wet but denies any bowl incontinence or tongue biting. EEG pending. He denies any hx of seizure. He has left foot wound since 12/2018 and MRI at that time showed early osteomyelitis. He has been following with outpt wound care clinic but not on Abx yet.   OBJECTIVE Vitals:   01/17/19 0300 01/17/19 0400 01/17/19 0500 01/17/19 0600  BP: 128/62 119/61 134/60 (!) 144/69  Pulse: 69 71 62 (!) 56  Resp:      Temp:  98.3 F (36.8 C)    TempSrc:  Oral    SpO2: 95% 94% 95% 96%  Weight:      Height:        CBC:  Recent Labs  Lab 01/17/19 0241  WBC 6.8  NEUTROABS 5.1  HGB 12.5*  HCT 37.3*  MCV 89.0  PLT 149*    Basic Metabolic Panel:  Recent Labs  Lab 01/17/19 0241  NA 130*  K 3.9  CL 93*  CO2 24  GLUCOSE 282*  BUN 16  CREATININE 1.22  CALCIUM 9.0    Lipid Panel:     Component Value Date/Time   CHOL 131 01/17/2019 0241   TRIG 130 01/17/2019 0241   HDL 35 (L) 01/17/2019 0241   CHOLHDL 3.7 01/17/2019 0241   VLDL 26 01/17/2019 0241   LDLCALC 70 01/17/2019 0241   HgbA1c: No results found for: HGBA1C Urine Drug Screen: No results found for: LABOPIA, COCAINSCRNUR, LABBENZ, AMPHETMU, THCU, LABBARB  Alcohol Level No results found for: [facility]  IMAGING  Dg Chest Port 1 View 01/17/2019 IMPRESSION:  Improved lung volumes and aeration from Radiograph yesterday with residual atelectasis or scarring at the right lung base. Aortic Atherosclerosis (ICD10-I70.0).   Dg Foot 2 Views Left 01/17/2019 IMPRESSION:  1. Subtle decreased density the fifth metatarsal head in the region of question osteomyelitis on MRI  last month, suspicious for osteomyelitis.  2. Cortical irregularity of the fifth toe proximal phalanx may represent fracture or osteomyelitis.  3. Site of soft tissue ulcer not well demonstrated on current exam.   CT Head OSH - no acute findings.  CTA Head and neck OSH - 74% stenosis of the left ICA with bulky calcified plaque.   Transthoracic Echocardiogram  Pending  Bilateral Carotid Dopplers  Pending   ECG - SR rate 89 BPM. (See cardiology reading for complete details)   EEG - pending   PHYSICAL EXAM  Temp:  [98.3 F (36.8 C)-101.3 F (38.5 C)] 98.9 F (37.2 C) (10/18 0800) Pulse Rate:  [56-90] 65 (10/18 1000) Resp:  [13-27] 21 (10/18 1000) BP: (104-170)/(57-121) 170/73 (10/18 1000) SpO2:  [94 %-100 %] 96 % (10/18 1000) Weight:  [91.9 kg-99.3 kg] 91.9 kg (10/18 0215)  General - Well nourished, well developed, in no apparent distress.  Ophthalmologic - fundi not visualized due to noncooperation.  Cardiovascular - Regular rhythm and rate.  Mental Status -  Level of arousal and orientation to time, place, and person were intact. Language including expression, naming, repetition, comprehension was assessed and found intact. Fund of Knowledge was assessed and was intact.  Cranial Nerves II -  XII - II - Visual field intact OU. III, IV, VI - Extraocular movements intact. V - Facial sensation intact bilaterally. VII - Facial movement intact bilaterally. VIII - Hearing & vestibular intact bilaterally. X - Palate elevates symmetrically. XI - Chin turning & shoulder shrug intact bilaterally. XII - Tongue protrusion intact.  Motor Strength - The patient's strength was normal in all extremities and pronator drift was absent.  Bulk was normal and fasciculations were absent. 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 Left foot lateral sole round shaped wound with dressing.   Motor Tone - Muscle tone was assessed at the neck and appendages and  was normal.  Reflexes - The patient's reflexes were symmetrical in all extremities and he had no pathological reflexes.  Sensory - Light touch, temperature/pinprick were assessed and were symmetrical.  Painful on palpation near left foot wound.  Coordination - The patient had normal movements in the hands and feet with no ataxia or dysmetria.  Tremor was absent.  Gait and Station - deferred.    ASSESSMENT/PLAN [name] is a 68 y.o. male with history of diabetes, htn, COPD, CAD with MI, OSA, hyperlipidemia and Lt foot wound presenting to [facility] with AMS, global aphasia, tremor, urinary incontinence and fever.  He received IV t-PA at [facility] Sunday Oct 18th at 1935.  Likely GTC seizure - s/p tPA with suspected stroke like activity OSH  Resultant  Back to pre-admission baseline  Code Stroke CT Head OSH - no acute finding   CTA H&N - OSH - left ICA 74% stenosis with calcified plaque - no LVO  MRI head - pending  Carotid Doppler - pending  EEG - pending  2D Echo - pending  Sars Corona Virus 2 - OSH?  LDL - 70  HgbA1c - pending  UDS - pending  VTE prophylaxis - SCDs  No antithrombotic prior to admission, now on No antithrombotic within 24h s/p tPA  Keppra 1g load on admission and will continue keppra 500mg  bid  Patient will be counseled to be compliant with his antithrombotic medications  Ongoing aggressive stroke risk factor management  Therapy recommendations:  pending  Disposition:  Pending  High grade fever - left foot osteomyelitis - ? Endocarditis  12/2018 MRI left foot showed early osteomyelitis  Follow up with outpt wound care but not on Abx yet as per pt  Presented with high grade fever 104->101.3->afebrile  Blood culture pending  On cefepime and vanco  Left foot wound - wound care consulted  Need ID input - will call consult  May need TEE if MRI positive for stroke or positive blood culture or depending on ID input    Hypertension  Home BP meds: none  Off cardene  Stable on the high end  Put on amlodipine 10 . Long-term BP goal normotensive  Diabetes  Home diabetic meds: none  HgbA1c - pending, goal < 7.0  Hyperglycemia  SSI  CBG monitoring  On DM diet  Other Stroke Risk Factors  Advanced age  Coronary artery disease  Obstructive sleep apnea  Other Active Problems  Mild Hyponatremia - 130  Hospital day # 0  This patient is critically ill due to stroke like symptoms s/p tPA, seizure, osteomyelitis with fever, HTN and at significant risk of neurological worsening, death form hemorrhage, ICH, status epilepticus, DKA. This patient's care requires constant monitoring of vital signs, hemodynamics, respiratory and cardiac monitoring, review of multiple databases, neurological assessment, discussion with family,  other specialists and medical decision making of high complexity. I spent 35 minutes of neurocritical care time in the care of this patient.  Rosalin Hawking, MD PhD Stroke Neurology 01/17/2019 11:58 AM   To contact Stroke Continuity provider, please refer to [email or web address] After hours, contact General Neurology

## 2019-01-17 NOTE — Progress Notes (Signed)
EEG Completed; Results Pending  

## 2019-01-17 NOTE — Progress Notes (Signed)
  Echocardiogram 2D Echocardiogram has been performed.  Danny Carpenter 01/17/2019, 4:12 PM

## 2019-01-17 NOTE — Consult Note (Signed)
Vascular and Vein Specialist of [facility]  Patient name: Danny Carpenter MRN: [id number] DOB: 1950-10-10 Sex: male   REQUESTING PROVIDER:   Hospital service   REASON FOR CONSULT:    Ulcer  HISTORY OF PRESENT ILLNESS:   Danny Carpenter is a 68 y.o. male, who I have been asked to evaluate for a chronic left toe wound.  The patient states that he has been having this debrided periodically for several months.  X-rays showed possible osteomyelitis in the fifth metatarsal head and proximal phalanx.  He is on IV antibiotics.  He has been having fevers.  He does report claudication type symptoms with ambulation.  He also states that he had a discolored toe about 10 to 15 years ago and underwent left leg stenting at [facility].  The patient initially presented to [facility] with mental status changes and aphasia.  Code stroke was initiated and he received TPA and was transferred to [facility].  His neurologic status is back to baseline.  He is currently undergoing work-up for this versus possible seizure activity.  The patient is a poorly controlled diabetic with obesity.  He suffers from obstructive sleep apnea as well as COPD.  He is medically managed for hypertension.  PAST MEDICAL HISTORY    Past Medical History:  Diagnosis Date  . COPD (chronic obstructive pulmonary disease) ([place])   . DM2 (diabetes mellitus, type 2) ([place])   . GERD (gastroesophageal reflux disease)   . Heart attack ([place])   . HTN (hypertension)   . Hypercholesteremia   . Sleep apnea      FAMILY HISTORY   History reviewed. No pertinent family history.  SOCIAL HISTORY:   Social History   Socioeconomic History  . Marital status: Married    Spouse name: Not on file  . Number of children: Not on file  . Years of education: Not on file  . Highest education level: Not on file  Occupational History  . Not on file  Social Needs  . Financial resource strain: Not on file   . Food insecurity    Worry: Not on file    Inability: Not on file  . Transportation needs    Medical: Not on file    Non-medical: Not on file  Tobacco Use  . Smoking status: Not on file  Substance and Sexual Activity  . Alcohol use: Not on file  . Drug use: Not on file  . Sexual activity: Not on file  Lifestyle  . Physical activity    Days per week: Not on file    Minutes per session: Not on file  . Stress: Not on file  Relationships  . Social Herbalist on phone: Not on file    Gets together: Not on file    Attends religious service: Not on file    Active member of club or organization: Not on file    Attends meetings of clubs or organizations: Not on file    Relationship status: Not on file  . Intimate partner violence    Fear of current or ex partner: Not on file    Emotionally abused: Not on file    Physically abused: Not on file    Forced sexual activity: Not on file  Other Topics Concern  . Not on file  Social History Narrative  . Not on file    ALLERGIES:    No Known Allergies  CURRENT MEDICATIONS:    Current Facility-Administered Medications  Medication Dose  Route Frequency Provider Last Rate Last Dose  .  stroke: mapping our early stages of recovery book   Does not apply Once Greta Doom, MD      . acetaminophen (TYLENOL) tablet 650 mg  650 mg Oral Q4H PRN Greta Doom, MD   650 mg at 01/17/19 [id number]  . amLODipine (NORVASC) tablet 10 mg  10 mg Oral Daily Rosalin Hawking, MD   10 mg at 01/17/19 1322  . ceFEPIme (MAXIPIME) 2 g in sodium chloride 0.9 % 100 mL IVPB  2 g Intravenous Q8H Laren Everts, RPH   Stopped at 01/17/19 1654  . Chlorhexidine Gluconate Cloth 2 % PADS 6 each  6 each Topical Daily Greta Doom, MD      . gabapentin (NEURONTIN) capsule 800 mg  800 mg Oral TID Greta Doom, MD   800 mg at 01/17/19 1624  . [START ON 01/18/2019] influenza vaccine adjuvanted (FLUAD) injection 0.5 mL  0.5 mL  Intramuscular Tomorrow-1000 Rosalin Hawking, MD      . insulin aspart (novoLOG) injection 0-15 Units  0-15 Units Subcutaneous TID AC & HS Rosalin Hawking, MD   3 Units at 01/17/19 1717  . insulin glargine (LANTUS) injection 10 Units  10 Units Subcutaneous Daily Ina Homes, MD   10 Units at 01/17/19 1047  . labetalol (NORMODYNE) injection 10-20 mg  10-20 mg Intravenous Q10 min PRN Rosalin Hawking, MD      . levETIRAcetam (KEPPRA) tablet 500 mg  500 mg Oral BID Rosalin Hawking, MD   500 mg at 01/17/19 1322  . menthol-cetylpyridinium (CEPACOL) lozenge 3 mg  1 lozenge Oral PRN Rosalin Hawking, MD      . pantoprazole (PROTONIX) EC tablet 40 mg  40 mg Oral Daily Rosalin Hawking, MD   40 mg at 01/17/19 1624  . vancomycin (VANCOCIN) 1,250 mg in sodium chloride 0.9 % 250 mL IVPB  1,250 mg Intravenous Q12H Laren Everts, RPH   Stopped at 01/17/19 1556    REVIEW OF SYSTEMS:   [X]  denotes positive finding, [ ]  denotes negative finding Cardiac  Comments:  Chest pain or chest pressure:    Shortness of breath upon exertion:    Short of breath when lying flat:    Irregular heart rhythm:        Vascular    Pain in calf, thigh, or hip brought on by ambulation: x   Pain in feet at night that wakes you up from your sleep:     Blood clot in your veins:    Leg swelling:         Pulmonary    Oxygen at home:    Productive cough:     Wheezing:         Neurologic    Sudden weakness in arms or legs:     Sudden numbness in arms or legs:     Sudden onset of difficulty speaking or slurred speech:    Temporary loss of vision in one eye:     Problems with dizziness:         Gastrointestinal    Blood in stool:      Vomited blood:         Genitourinary    Burning when urinating:     Blood in urine:        Psychiatric    Major depression:         Hematologic    Bleeding problems:    Problems with blood clotting  too easily:        Skin    Rashes or ulcers: x       Constitutional    Fever or chills:      PHYSICAL EXAM:   Vitals:   01/17/19 1700 01/17/19 1744 01/17/19 1745 01/17/19 1800  BP: (!) 153/69   (!) 149/81  Pulse: 80 79 77 76  Resp: (!) 24     Temp:      TempSrc:      SpO2: 94% 98% 97% 97%  Weight:      Height:        GENERAL: The patient is a well-nourished male, in no acute distress. The vital signs are documented above. CARDIAC: There is a regular rate and rhythm.  VASCULAR: Palpable femoral pulses bilaterally.  Nonpalpable pedal pulses PULMONARY: Nonlabored respirations ABDOMEN: Soft and non-tender with normal pitched bowel sounds.  MUSCULOSKELETAL: There are no major deformities or cyanosis. NEUROLOGIC: No focal weakness or paresthesias are detected. SKIN: 2 x 2 millimeter ulceration at the base of the fifth metatarsal head on the left PSYCHIATRIC: The patient has a normal affect.      STUDIES:   I have reviewed his vascular studies with the following findings:    +-------+-----------+-----------+------------+------------+ ABI/TBIToday's ABIToday's TBIPrevious ABIPrevious TBI +-------+-----------+-----------+------------+------------+ Right  0.85       0.63                                +-------+-----------+-----------+------------+------------+ Left   0.6                                            +-------+-----------+-----------+------------+------------+   Carotid duplex: Right Carotid: Velocities in the right ICA are consistent with a 1-39% stenosis.  Left Carotid: Velocities in the left ICA are consistent with a 1-39% stenosis.  Vertebrals:  Bilateral vertebral arteries demonstrate antegrade flow. Subclavians: Normal flow hemodynamics were seen in bilateral subclavian              arteries.  ASSESSMENT and PLAN   Left great toe ulcer: I discussed with the patient that based on his vascular lab studies, he has decreased blood flow to his left leg which is likely contributing to his inability to heal his wound.  He is at  risk for amputation of the fifth toe but before proceeding, he needs to have catheter-based angiography to evaluate his blood flow and possibly intervene.  This is been scheduled for Tuesday.  The patient wants to go home tomorrow.  If that happens, he could be as an outpatient this Tuesday.  If he stays in the hospital, he will need to be n.p.o. after midnight on Monday for procedure on Tuesday.  The patient should be on aspirin as well as a statin.  Possible stroke: Carotid Doppler studies showed no significant carotid stenosis.  Reportedly he had a stenosis on a CT angiogram at [facility] however I have not been able to view this.  Neurology is following.   Leia Alf, MD, FACS [facility] [phone] Pager [phone]

## 2019-01-17 NOTE — Progress Notes (Signed)
    Subjective: Patient was seen and evaluated at bedside on morning rounds. He is doing well. Denies localized weakness or numbness. We discussed the reason for admission and plan of care. Talked about his diabetic foot and its infection as source of his fever.  Objective:  Vital signs in last 24 hours: Vitals:   01/17/19 1200 01/17/19 1300 01/17/19 1322 01/17/19 1400  BP: (!) 163/68 (!) 175/73 (!) 175/73 136/90  Pulse: 70 65  (!) 59  Resp: 15     Temp: 99.3 F (37.4 C)     TempSrc: Oral     SpO2: 96% 94%  96%  Weight:      Height:       Physical exam: General: Normal appearing gentleman, resting in bed in no acute distress CV: RRR, normal S1-S2 Extremities: Left foot is wrapped.  Toes are warm cannot palpate dorsalis pedis pulse Neurology: Alert and oriented x3, can move all of his extremity  Assessment/Plan:  Active Problems:   CVA (cerebral vascular accident) [facility])   Diabetic foot infection ([place])  68 year old gentleman, with past medical history of insulin-dependent DM, HTN, HLD, tobacco use, transferred per neurology from [facility] for possible CVA and after treated with TPA..  IMTS was consulted given patient had fever on arrival.   Neuropathic Diabetic ulcer of left, fifth MTP: Did not respond to p.o. antibiotic and outpatient wound care.  X-ray is concerning for possible osteomyelitis. Patient has been on Vanco and cefepime 10/17>  PAD left ABI: 0.6. Vascular surgery consulted.   - Appreciate vascular surgery recommendation -Continue vancomycin and cefepime -Trend WBC -Trend fever -Follow blood culture   Insulin dependent T2DM: On Xultophy 22 units QD and Novolog 8 units TID WC at home  - Continue Lantus 10 units QD and SSI-moderate every 4 hours while NPO  - CBG goal <180   Possible stroke: Presented with aphasia and ALS S/P TPA in [place] hospital. Transferred to [facility] for further management. Unclear if his symptoms was 2/2 stroke vs infection.   -Getting work up and management per neurology  -Holding home HTN medications  Dispo: Anticipated discharge in approximately depends on clinical improvement  Dewayne Hatch, MD 01/17/2019, 2:53 PM Pager: [phone]

## 2019-01-18 DIAGNOSIS — R569 Unspecified convulsions: Secondary | ICD-10-CM

## 2019-01-18 DIAGNOSIS — M86272 Subacute osteomyelitis, left ankle and foot: Secondary | ICD-10-CM

## 2019-01-18 DIAGNOSIS — A419 Sepsis, unspecified organism: Principal | ICD-10-CM

## 2019-01-18 DIAGNOSIS — I739 Peripheral vascular disease, unspecified: Secondary | ICD-10-CM

## 2019-01-18 LAB — BASIC METABOLIC PANEL
Anion gap: 11 (ref 5–15)
BUN: 13 mg/dL (ref 8–23)
CO2: 22 mmol/L (ref 22–32)
Calcium: 8.8 mg/dL — ABNORMAL LOW (ref 8.9–10.3)
Chloride: 99 mmol/L (ref 98–111)
Creatinine, Ser: 1.2 mg/dL (ref 0.61–1.24)
GFR calc Af Amer: >60 mL/min (ref >60)
GFR calc non Af Amer: >60 mL/min (ref >60)
Glucose, Bld: 264 mg/dL — ABNORMAL HIGH (ref 70–99)
Potassium: 4 mmol/L (ref 3.5–5.1)
Sodium: 132 mmol/L — ABNORMAL LOW (ref 135–145)

## 2019-01-18 LAB — GLUCOSE, CAPILLARY
Glucose-Capillary: 280 mg/dL — ABNORMAL HIGH (ref 70–99)
Glucose-Capillary: 284 mg/dL — ABNORMAL HIGH (ref 70–99)
Glucose-Capillary: 214 mg/dL — ABNORMAL HIGH (ref 70–99)
Glucose-Capillary: 326 mg/dL — ABNORMAL HIGH (ref 70–99)

## 2019-01-18 LAB — HEMOGLOBIN A1C
Hgb A1c MFr Bld: 10.8 % — ABNORMAL HIGH (ref 4.8–5.6)
Mean Plasma Glucose: 263 mg/dL

## 2019-01-18 LAB — URINE CULTURE: Culture: NO GROWTH

## 2019-01-18 LAB — CBC
HCT: 34.9 % — ABNORMAL LOW (ref 39.0–52.0)
Hemoglobin: 11.8 g/dL — ABNORMAL LOW (ref 13.0–17.0)
MCH: 29.9 pg (ref 26.0–34.0)
MCHC: 33.8 g/dL (ref 30.0–36.0)
MCV: 88.4 fL (ref 80.0–100.0)
Platelets: 103 10*3/uL — ABNORMAL LOW (ref 150–400)
RBC: 3.95 MIL/uL — ABNORMAL LOW (ref 4.22–5.81)
RDW: 13.2 % (ref 11.5–15.5)
WBC: 5.3 10*3/uL (ref 4.0–10.5)
nRBC: 0 % (ref 0.0–0.2)

## 2019-01-18 LAB — RPR: RPR Ser Ql: NONREACTIVE

## 2019-01-18 MED ORDER — GABAPENTIN 300 MG PO CAPS
600.0000 mg | ORAL_CAPSULE | Freq: Two times a day (BID) | ORAL | Status: DC
  Administered 2019-01-18 – 2019-01-20 (×5): 600 mg via ORAL
  Filled 2019-01-18 (×5): qty 2

## 2019-01-18 MED ORDER — CLOPIDOGREL BISULFATE 75 MG PO TABS
75.0000 mg | ORAL_TABLET | Freq: Every day | ORAL | Status: DC
  Administered 2019-01-18: 09:00:00 75 mg via ORAL
  Filled 2019-01-18: qty 1

## 2019-01-18 MED ORDER — LISINOPRIL 40 MG PO TABS
40.0000 mg | ORAL_TABLET | Freq: Every day | ORAL | Status: DC
  Administered 2019-01-18 – 2019-01-20 (×3): 40 mg via ORAL
  Filled 2019-01-18: qty 1
  Filled 2019-01-18: qty 2

## 2019-01-18 MED ORDER — ASPIRIN EC 81 MG PO TBEC
81.0000 mg | DELAYED_RELEASE_TABLET | Freq: Every day | ORAL | Status: DC
  Administered 2019-01-18 – 2019-01-20 (×2): 81 mg via ORAL
  Filled 2019-01-18 (×2): qty 1

## 2019-01-18 MED ORDER — INSULIN GLARGINE 100 UNIT/ML ~~LOC~~ SOLN
13.0000 [IU] | Freq: Every day | SUBCUTANEOUS | Status: DC
  Administered 2019-01-20: 13 [IU] via SUBCUTANEOUS
  Filled 2019-01-18 (×2): qty 0.13

## 2019-01-18 MED ORDER — GUAIFENESIN-DM 100-10 MG/5ML PO SYRP
5.0000 mL | ORAL_SOLUTION | Freq: Three times a day (TID) | ORAL | Status: DC | PRN
  Administered 2019-01-18: 5 mL via ORAL
  Filled 2019-01-18: qty 5

## 2019-01-18 MED ORDER — GABAPENTIN (ONCE-DAILY) 600 MG PO TABS: 1200.0000 mg | ORAL_TABLET | Freq: Every day | ORAL | Status: DC

## 2019-01-18 MED ORDER — TAMSULOSIN HCL 0.4 MG PO CAPS
0.4000 mg | ORAL_CAPSULE | Freq: Every day | ORAL | Status: DC
  Administered 2019-01-18 – 2019-01-19 (×2): 0.4 mg via ORAL
  Filled 2019-01-18 (×2): qty 1

## 2019-01-18 MED ORDER — DULOXETINE HCL 60 MG PO CPEP
60.0000 mg | ORAL_CAPSULE | Freq: Every day | ORAL | Status: DC
  Administered 2019-01-18 – 2019-01-20 (×3): 60 mg via ORAL
  Filled 2019-01-18 (×3): qty 1

## 2019-01-18 MED ORDER — ATORVASTATIN CALCIUM 40 MG PO TABS
40.0000 mg | ORAL_TABLET | Freq: Every day | ORAL | Status: DC
  Administered 2019-01-18 – 2019-01-20 (×3): 40 mg via ORAL
  Filled 2019-01-18 (×3): qty 1

## 2019-01-18 MED ORDER — TRAZODONE HCL 100 MG PO TABS
100.0000 mg | ORAL_TABLET | Freq: Every day | ORAL | Status: DC
  Administered 2019-01-18 – 2019-01-19 (×2): 100 mg via ORAL
  Filled 2019-01-18: qty 1
  Filled 2019-01-18: qty 2

## 2019-01-18 MED ORDER — INSULIN ASPART 100 UNIT/ML ~~LOC~~ SOLN
0.0000 [IU] | SUBCUTANEOUS | Status: DC
  Administered 2019-01-18: 5 [IU] via SUBCUTANEOUS
  Administered 2019-01-18: 21:00:00 11 [IU] via SUBCUTANEOUS
  Administered 2019-01-18 (×2): 8 [IU] via SUBCUTANEOUS
  Administered 2019-01-19 (×2): 3 [IU] via SUBCUTANEOUS
  Administered 2019-01-19: 16:00:00 11 [IU] via SUBCUTANEOUS
  Administered 2019-01-19: 5 [IU] via SUBCUTANEOUS
  Administered 2019-01-20 (×2): 3 [IU] via SUBCUTANEOUS
  Administered 2019-01-20: 13:00:00 15 [IU] via SUBCUTANEOUS

## 2019-01-18 MED ORDER — FENOFIBRATE 160 MG PO TABS
160.0000 mg | ORAL_TABLET | Freq: Every day | ORAL | Status: DC
  Administered 2019-01-18 – 2019-01-20 (×2): 160 mg via ORAL
  Filled 2019-01-18 (×3): qty 1

## 2019-01-18 NOTE — Progress Notes (Signed)
Inpatient Diabetes Program Recommendations  AACE/ADA: New Consensus Statement on Inpatient Glycemic Control (2015)  Target Ranges:  Prepandial:   less than 140 mg/dL      Peak postprandial:   less than 180 mg/dL (1-2 hours)      Critically ill patients:  140 - 180 mg/dL   Lab Results  Component Value Date   GLUCAP 284 (H) 01/18/2019   HGBA1C 10.8 (H) 01/17/2019    Review of Glycemic Control Results for ROB, NIDIFFER (MRN [id number]) as of 01/18/2019 15:02  Ref. Range 01/18/2019 08:21 01/18/2019 11:55  Glucose-Capillary Latest Ref Range: 70 - 99 mg/dL 280 (H) 284 (H)   Diabetes history: DM 2 Outpatient Diabetes medications: Xultophy (degludec-Liriglutide) 100-3.6 22 units qhs, Novolog 6-8 units tid Current orders for Inpatient glycemic control:  Lantus 13 units Novolog 0-15 units Q4 hours.  Inpatient Diabetes Program Recommendations:    Glucose 280's today. Pt received Lantus 10 units last night. Consider increasing Lantus to 18-20 units.  A1c 10.8% this admission. Will see patient this admission.  Thanks,  Tama Headings RN, MSN, BC-ADM Inpatient Diabetes Coordinator Team Pager [phone] (8a-5p)

## 2019-01-18 NOTE — Consult Note (Addendum)
[place] Nurse wound consult note Reason for Consult: Consult requested to provide topical treatment recommendations for left foot wound.  Vascular team has assessed the site and plans to perform a possible arteriogram to increase blood flow, but discussed with patient that possible amputation of 5th toe may eventually be required related to decreased perfusion which can impair healing.  Reviewed photos and progress notes in the EMR. Wound type: Chronic full thickness wound to left plantar foot Periwound: Intact skin surrounding. Wound is red and moist Dressing procedure/placement/frequency: Topical treatment orders provided for bedside nurses to perform daily as follows: Cut piece of Aquacel to provide antimicrobial benefits Kellie Simmering # [id number]) and tuck into left foot wound Q day, then cover with foam dressing.  (Change foam dressing Q 3 days or PRN soiling.) Moisten previous dressing with NS to remove each time. Pt should continue to follow-up with vascular team. Please re-consult if further assistance is needed.  Thank-you,  Julien Girt MSN, [place], [place], [place], [place]
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
## 2019-01-18 NOTE — Progress Notes (Signed)
STROKE TEAM PROGRESS NOTE   INTERVAL HISTORY Pt sitting in chair. No acute event overnight. No complains. Today is his birthday, he planned to go home but I told him that we will have ID to see him and Dr. Trula Carpenter from [facility] is going to do angiogram on him tomorrow. He will still with [place] for the next a couple of days. He expressed understanding.   OBJECTIVE Vitals:   01/18/19 0200 01/18/19 0400 01/18/19 0600 01/18/19 0800  BP: (!) 144/63 138/70 (!) 160/67   Pulse: 80 66 63   Resp: 14 11 12    Temp:      TempSrc:      SpO2: 96% 94% 97% 98%  Weight:      Height:        CBC:  Recent Labs  Lab 01/17/19 0241 01/18/19 0659  WBC 6.8 5.3  NEUTROABS 5.1  --   HGB 12.5* 11.8*  HCT 37.3* 34.9*  MCV 89.0 88.4  PLT 149* 103*    Basic Metabolic Panel:  Recent Labs  Lab 01/17/19 0241 01/18/19 0659  NA 130* 132*  K 3.9 4.0  CL 93* 99  CO2 24 22  GLUCOSE 282* 264*  BUN 16 13  CREATININE 1.22 1.20  CALCIUM 9.0 8.8*    Lipid Panel:     Component Value Date/Time   CHOL 131 01/17/2019 0241   TRIG 130 01/17/2019 0241   HDL 35 (L) 01/17/2019 0241   CHOLHDL 3.7 01/17/2019 0241   VLDL 26 01/17/2019 0241   LDLCALC 70 01/17/2019 0241   HgbA1c: No results found for: HGBA1C Urine Drug Screen:     Component Value Date/Time   LABOPIA POSITIVE (A) 01/17/2019 1928   COCAINSCRNUR NONE DETECTED 01/17/2019 1928   LABBENZ NONE DETECTED 01/17/2019 1928   AMPHETMU NONE DETECTED 01/17/2019 1928   THCU NONE DETECTED 01/17/2019 1928   LABBARB NONE DETECTED 01/17/2019 1928    Alcohol Level No results found for: [facility]  IMAGING  Dg Chest Port 1 View 01/17/2019 Improved lung volumes and aeration from Radiograph yesterday with residual atelectasis or scarring at the right lung base. Aortic Atherosclerosis (ICD10-I70.0).   Dg Foot 2 Views Left 01/17/2019 1. Subtle decreased density the fifth metatarsal head in the region of question osteomyelitis on MRI last month, suspicious for  osteomyelitis.  2. Cortical irregularity of the fifth toe proximal phalanx may represent fracture or osteomyelitis.  3. Site of soft tissue ulcer not well demonstrated on current exam.   MRI left foot 12/18/2018 IMPRESSION: 1. Area of ulceration over the lateral plantar base of the fifth metatarsal head with a tract to the underlying cortical surface. There is findings which could be suggestive of early osteomyelitis of the fifth metatarsal head underlying the area of ulceration. No abscess 2. Probable reactive marrow involving the fifth proximal phalanx.  Electronically Signed By: Danny Carpenter M.D. On: 12/18/2018 16:24   CT Head OSH Stable and normal for age non contrast CT appearance of the brain. No acute traumatic injury identified. ASPECTS 10.  Electronically Signed: By: Danny Carpenter M.D. On: 01/16/2019 19:37   CTA Head and neck OSH IMPRESSION: 1. Negative for large vessel occlusion, but positive for: - near occlusion of the right vertebral artery V4 segment due to high-grade stenosis from calcified plaque. - high-grade Left ICA origin stenosis due to bulky calcified plaque, numerically estimated at 74%. - moderate distal left vertebral artery stenosis. - up to moderate right ICA siphon stenosis at the anterior genu.  2. Widespread atherosclerosis  elsewhere in the head and neck but no other hemodynamically significant stenosis.  3. Aortic Atherosclerosis (ICD10-I70.0).  Electronically Signed By: Danny Carpenter M.D. On: 01/16/2019 20:38   Mr Danny Carpenter [id number] Contrast 01/17/2019 1. No acute intracranial abnormality. 2. Moderate generalized atrophy. 3. Remote lacunar infarcts of the left cerebellum. 4. Abnormal signal in the distal right vertebral artery, V4 segment, likely related to slow flow or occlusion. 5. Bilateral mastoid effusions.   Vas [place] Burnard Bunting With/wo Tbi 01/17/2019 Right: Resting right ankle-brachial index indicates mild right lower extremity arterial disease.  Left:  Resting left ankle-brachial index indicates moderate left lower extremity arterial disease. Unable to obtain TBI due to patient movement, and patient somnolence.  Bilateral Carotid Dopplers  01/17/2019 Right Carotid: Velocities in the right ICA are consistent with a 1-39% stenosis.  Left Carotid: Velocities in the left ICA are consistent with a 1-39% stenosis.  Vertebrals:  Bilateral vertebral arteries demonstrate antegrade flow. Subclavians: Normal flow hemodynamics were seen in bilateral subclavian arteries.   Transthoracic Echocardiogram   1. Left ventricular ejection fraction, by visual estimation, is 55 to 60%. The left ventricle has normal function. Normal left ventricular size. There is no left ventricular hypertrophy.  2. Frequent PVCs. No source of cardiac embolism detected.  3. Definity contrast agent was given IV to delineate the left ventricular endocardial borders.  4. Left ventricular diastolic Doppler parameters are consistent with impaired relaxation pattern of LV diastolic filling.  5. No LV thrombus seen contrast imaging.  6. Global right ventricle has normal systolic function.The right ventricular size is normal. No increase in right ventricular wall thickness.  7. Left atrial size was mildly dilated.  8. Right atrial size was normal.  9. Mild aortic valve annular calcification. 10. The mitral valve is degenerative. Trace mitral valve regurgitation. 11. The tricuspid valve is grossly normal. Tricuspid valve regurgitation is trivial. 12. The aortic valve is tricuspid Aortic valve regurgitation was not visualized by color flow Doppler. Structurally normal aortic valve, with no evidence of sclerosis or stenosis. 13. There is Mild thickening of the aortic valve. 14. The pulmonic valve was grossly normal. Pulmonic valve regurgitation is trivial by color flow Doppler. 15. Mild plaque invoving the ascending aorta. 16. TR signal is inadequate for assessing pulmonary artery systolic  pressure. 17. The inferior vena cava is normal in size with greater than 50% respiratory variability, suggesting right atrial pressure of 3 mmHg.  ECG - SR rate 89 BPM. (See cardiology reading for complete details)  EEG Abnormality - Intermittent slow, left temporal This study is suggestive of non specific cortical dysfunction in left temporal region. No seizures or epileptiform discharges were seen throughout the recording.   PHYSICAL EXAM   Temp:  [98.4 F (36.9 C)-99.3 F (37.4 C)] 98.6 F (37 C) (10/19 0005) Pulse Rate:  [59-92] 63 (10/19 0600) Resp:  [11-27] 12 (10/19 0600) BP: (104-201)/(63-96) 160/67 (10/19 0600) SpO2:  [94 %-100 %] 98 % (10/19 0800)  General - Well nourished, well developed, in no apparent distress.  Ophthalmologic - fundi not visualized due to noncooperation.  Cardiovascular - Regular rhythm and rate.  Mental Status -  Level of arousal and orientation to time, place, and person were intact. Language including expression, naming, repetition, comprehension was assessed and found intact. Fund of Knowledge was assessed and was intact.  Cranial Nerves II - XII - II - Visual field intact OU. III, IV, VI - Extraocular movements intact. V - Facial sensation intact bilaterally. VII - Facial movement intact bilaterally.  VIII - Hearing & vestibular intact bilaterally. X - Palate elevates symmetrically. XI - Chin turning & shoulder shrug intact bilaterally. XII - Tongue protrusion intact.  Motor Strength - The patient's strength was normal in all extremities and pronator drift was absent.  Bulk was normal and fasciculations were absent. 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 Left foot lateral sole round shaped wound with dressing.   Motor Tone - Muscle tone was assessed at the neck and appendages and was normal.  Reflexes - The patient's reflexes were symmetrical in all extremities and he had no pathological  reflexes.  Sensory - Light touch, temperature/pinprick were assessed and were symmetrical.  Painful on palpation near left foot wound.  Coordination - The patient had normal movements in the hands and feet with no ataxia or dysmetria.  Tremor was absent.  Gait and Station - deferred.    ASSESSMENT/PLAN Danny Carpenter is a 68 y.o. male with history of diabetes, HTN, COPD, CAD with MI, OSA, hyperlipidemia and Lt foot wound presenting to [facility] with AMS, global aphasia, tremor, urinary incontinence and fever. He received IV t-PA at [facility] Sunday Oct 18th at 1935.  Likely GTC seizure - s/p tPA with suspected stroke like activity OSH  Resultant  Back to pre-admission baseline  Code Stroke CT Head OSH - no acute finding   CTA H&N - OSH - left ICA 74% stenosis with calcified plaque - no LVO  MRI head - no acute abnormality. Atrophy. Old L cerebellar lacune. Distal R VA w/ slow flow/occlusion. B mastoid effusions.  Carotid Doppler - B ICA 1-39% stenosis, VAs antegrade   EEG - intermittent L temporal slowing, no sz   2D Echo - EF 55-60%. LA dilated. No source of embolus   Lacey Jensen Virus 2 - neg OSH?  LDL - 70  HgbA1c - pending  HIV nonreactive  UDS - positive opiates (has morphine as home meds)  VTE prophylaxis - SCDs  No antithrombotic prior to admission, now on ASA 81mg  and plavix 75mg  for his PVD.   Keppra 1g load on admission and will continue keppra 500mg  bid  Therapy recommendations:  pending  Disposition:  Pending  PVD  ABIs - mild RLE arterial dz, moderate LLE arterial dz.   Dr. Trula Carpenter on board  Plan for angiogram with possible angioplasty tomorrow  On ASA and plavix and statin  Transient high grade fever - left foot osteomyelitis - ? Endocarditis  12/2018 MRI left foot showed early osteomyelitis  Follow up with outpt wound care but not on Abx yet as per pt  Presented with high grade fever  104->101.3->afebrile->afebrile  Blood culture NGTD <24H  On cefepime and vanco  Left foot wound - wound care consulted  On Vanc and cefepime 10/17>>Need ID input - will call consult  May consider TEE if ID recommend to rule out endocarditis  Carotid stenosis  CTA showed left ICA 74% stenosis with bulky calcified plaque  CUS unremarkable bilaterally  [facility] Dr. Trula Carpenter on board  No intervention recommended so far  Will need outpt follow up most likely   Hypertension  Home BP meds: lisinopril and spironolactone  Off cardene  Stable on the high end  Put on amlodipine 10 and resume lisinopril 40 . Long-term BP goal normotensive  Diabetes  Home diabetic meds: none  HgbA1c - pending, goal < 7.0  Hyperglycemia  SSI  CBG monitoring  On DM diet  Other Stroke Risk Factors  Advanced age  Coronary artery disease  Obstructive sleep apnea  Other Active Problems  Mild Hyponatremia - 130->132  Hospital day # 1  This patient is critically ill due to stroke like symptoms s/p tPA, seizure, osteomyelitis with fever, HTN and at significant risk of neurological worsening, death form hemorrhage, ICH, status epilepticus, DKA. This patient's care requires constant monitoring of vital signs, hemodynamics, respiratory and cardiac monitoring, review of multiple databases, neurological assessment, discussion with family, other specialists and medical decision making of high complexity. I spent 30 minutes of neurocritical care time in the care of this patient.  Rosalin Hawking, MD PhD Stroke Neurology 01/18/2019 8:38 AM   To contact Stroke Continuity provider, please refer to [email or web address] After hours, contact General Neurology

## 2019-01-18 NOTE — Consult Note (Signed)
[place] for Infectious Disease    Date of Admission:  01/16/2019     Total days of antibiotics 3               Reason for Consult:Osteomyelitis     Referring Provider: Erlinda Hong Primary Care Provider: Melony Overly, MD   ASSESSMENT:  Danny Carpenter is a 68 y/o male with osteomyelitis of the left fifth proximal phalanx and head of the fifth metatarsal admitted with concern for fever and global aphasia with concern acute stroke now s/p administration of tPA. MRI of the brain with no acute abnormalities and remote lucunar infarcts of the left cerebellum. No cultures were available from [facility] and blood cultures have been without growth to date since admission. Agree with continued broad spectrum coverage with vancomycin and cefepime given that he has been on antibiotics. It appears likely his symptoms may be related to infection as opposed to CVA. Does not meet the [facility] Criteria at present for concern for endocarditis and TTE without vegetation and no major valvular or heart dysfunction.  Vascular surgery planning for aortogram. Discussed plan of care to include 6 weeks of IV antibiotics and need for PICC line prior to discharge. Educated regarding the importance of blood sugar control, site off loading and continued wound care.   PLAN:  1. Continue current vancomycin and cefepime. 2. Await results or aortogram.  3. Wound care per Wound RN recommendations 4. Glucose control per primary team.  5. Check Hepatitis C antibody for routine health maintenance   6. Stroke care per neurology as indicated.    Active Problems:   CVA (cerebral vascular accident) ([place])   Diabetic foot infection ([place])     stroke: mapping our early stages of recovery book   Does not apply Once   amLODipine  10 mg Oral Daily   aspirin EC  81 mg Oral Daily   atorvastatin  40 mg Oral q1800   Chlorhexidine Gluconate Cloth  6 each Topical Daily   clopidogrel  75 mg Oral Daily   DULoxetine  60  mg Oral Daily   fenofibrate  160 mg Oral Daily   gabapentin  600 mg Oral BID   influenza vaccine adjuvanted  0.5 mL Intramuscular Tomorrow-1000   insulin aspart  0-15 Units Subcutaneous Q4H   insulin glargine  10 Units Subcutaneous Daily   levETIRAcetam  500 mg Oral BID   lisinopril  40 mg Oral Daily   morphine  15 mg Oral Q12H   pantoprazole  40 mg Oral Daily   tamsulosin  0.4 mg Oral QHS   traZODone  100 mg Oral QHS     HPI: Danny Carpenter is a 68 y.o. male with previous medical history of sleep apnea, hypertension, Type 2 diabetes and COPD transferred to [name] for fever, global aphasia and left foot ulcer with concern for acute CVA.   Danny Carpenter presented to [facility] with mental status change, global aphasia, and fever and subsequently treated with IV TPA with concern for acute CVA.  At [facility] he was noted to have a temperature of 104 degrees.  There are some report of reported shaking/possible seizure.  Danny Carpenter was also noted to have a wound on his left lateral foot that has been going on for approximately 1 year for which she has been on and off antibiotics and was recently seen by wound care with MRI on 12/2018 with concern for early osteomyelitis.  Danny Carpenter has since  neurologically returned to baseline.  He was initially febrile on admission with a temperature of 101.3 but has been afebrile for the last 24 hours.  White blood cell count has been normal.  X-ray imaging on admission with concern for fifth metatarsal head and proximal phalanx osteomyelitis.  Blood cultures are currently without growth to date.  No other culture results are available from [facility].  Danny Carpenter has been taking antibiotics through Saturday of this past week.  He is unclear as to which medication he has been on.  Review of records did show 1 course of Zyvox started back on 12/18/2018 for a 10-day course. ID has been consulted for management of his osteomyelitis.     Review of Systems: Review of Systems  Constitutional: Negative for chills, fever and weight loss.  Respiratory: Negative for cough, shortness of breath and wheezing.   Cardiovascular: Negative for chest pain and leg swelling.  Gastrointestinal: Negative for abdominal pain, constipation, diarrhea, nausea and vomiting.  Skin: Negative for rash.     Past Medical History:  Diagnosis Date   COPD (chronic obstructive pulmonary disease) (HCC)    DM2 (diabetes mellitus, type 2) (HCC)    GERD (gastroesophageal reflux disease)    Heart attack ([place])    HTN (hypertension)    Hypercholesteremia    Sleep apnea     Social History   Tobacco Use   Smoking status: Not on file  Substance Use Topics   Alcohol use: Not on file   Drug use: Not on file    History reviewed. No pertinent family history.  No Known Allergies  OBJECTIVE: Blood pressure (!) 147/61, pulse 70, temperature 98.3 F (36.8 C), temperature source Oral, resp. rate 11, height 6\' 1"  (1.854 m), weight 91.9 kg, SpO2 99 %.  Physical Exam Constitutional:      General: He is not in acute distress.    Appearance: He is well-developed.     Comments: Seated in the chair; pleasant.   Cardiovascular:     Rate and Rhythm: Normal rate and regular rhythm.     Heart sounds: Normal heart sounds.  Pulmonary:     Effort: Pulmonary effort is normal.     Breath sounds: Normal breath sounds.  Skin:    General: Skin is warm and dry.     Comments: Left food ulcer is packed and covered with gauze dressing with mild/moderate amount of drainage.   Neurological:     Mental Status: He is alert and oriented to person, place, and time.  Psychiatric:        Behavior: Behavior normal.        Thought Content: Thought content normal.        Judgment: Judgment normal.     Lab Results Lab Results  Component Value Date   WBC 5.3 01/18/2019   HGB 11.8 (L) 01/18/2019   HCT 34.9 (L) 01/18/2019   MCV 88.4 01/18/2019   PLT 103  (L) 01/18/2019    Lab Results  Component Value Date   CREATININE 1.20 01/18/2019   BUN 13 01/18/2019   NA 132 (L) 01/18/2019   K 4.0 01/18/2019   CL 99 01/18/2019   CO2 22 01/18/2019   No results found for: ALT, AST, GGT, ALKPHOS, BILITOT   Microbiology: Recent Results (from the past 240 hour(s))  MRSA PCR Screening     Status: None   Collection Time: 01/17/19  2:25 AM   Specimen: Nasopharyngeal  Result Value Ref Range Status   MRSA  by PCR NEGATIVE NEGATIVE Final    Comment:        The GeneXpert MRSA Assay (FDA approved for NASAL specimens only), is one component of a comprehensive MRSA colonization surveillance program. It is not intended to diagnose MRSA infection nor to guide or monitor treatment for MRSA infections. Performed at [facility], [place] [address] [place], [place] [address]   Blood culture (routine x 2)     Status: None (Preliminary result)   Collection Time: 01/17/19  2:34 AM   Specimen: BLOOD  Result Value Ref Range Status   Specimen Description BLOOD LEFT ANTECUBITAL  Final   Special Requests   Final    BOTTLES DRAWN AEROBIC ONLY Blood Culture adequate volume   Culture   Final    NO GROWTH 1 DAY Performed at [facility], [place] [address], [place], [place] [address]    Report Status PENDING  Incomplete  Blood culture (routine x 2)     Status: None (Preliminary result)   Collection Time: 01/17/19  2:41 AM   Specimen: BLOOD  Result Value Ref Range Status   Specimen Description BLOOD LEFT ANTECUBITAL  Final   Special Requests   Final    BOTTLES DRAWN AEROBIC ONLY Blood Culture adequate volume   Culture   Final    NO GROWTH 1 DAY Performed at [facility], [place] [address] [place], [place] [address]    Report Status PENDING  Incomplete     Terri Piedra, [place] [facility] [phone] Pager  01/18/2019  1:13 PM

## 2019-01-18 NOTE — Evaluation (Signed)
Physical Therapy Evaluation Patient Details Name: Danny Carpenter MRN: [id number] DOB: Jul 28, 1950 Today's Date: 01/18/2019   History of Present Illness  68 yo initially admitted to [facility] with tremor, aphasia and incontinence s/p tPT. CT (-) and MRI with remote left cerebellar infarct. Pt also with left toe osteomyelitis. PMhx: DM, obesity, OSA, COPD, HTN  Clinical Impression  Pt sitting EOB on arrival between foot board and raised bed rail. Pt reports attending a adult day center with [facility] and that he has participated for 4 years and received OPPT and wound care. Pt reports walking with RW at home and 12 falls in the last year. Pt with mild balance and cognitive deficits noted who will benefit from acute therapy to maximize mobility for safe return home with resumption of current services.     Follow Up Recommendations Outpatient PT(pt was receiving services with [facility])    Equipment Recommendations  None recommended by PT    Recommendations for Other Services       Precautions / Restrictions Precautions Precautions: Fall Restrictions Weight Bearing Restrictions: No      Mobility  Bed Mobility               General bed mobility comments: pt sitting EOB on arrival  Transfers Overall transfer level: Needs assistance   Transfers: Sit to/from Stand Sit to Stand: Min guard         General transfer comment: cues for hand placement with decreased control of descent to recliner  Ambulation/Gait Ambulation/Gait assistance: Supervision Gait Distance (Feet): 300 Feet Assistive device: Rolling walker (2 wheeled) Gait Pattern/deviations: Step-through pattern;Decreased stride length   Gait velocity interpretation: >2.62 ft/sec, indicative of community ambulatory General Gait Details: slow steady gait with use of RW  Stairs            Wheelchair Mobility    Modified Rankin (Stroke Patients Only)       Balance Overall balance assessment: Mild deficits  observed, not formally tested                                           Pertinent Vitals/Pain Pain Assessment: No/denies pain    Home Living Family/patient expects to be discharged to:: Private residence Living Arrangements: Spouse/significant other Available Help at Discharge: Family;Available 24 hours/day Type of Home: House Home Access: Ramped entrance     Home Layout: One level Home Equipment: Cane - single point;Youth worker - 4 wheels Additional Comments: Goes to [facility] adult program 3x/wk for wound care, therapy and activities    Prior Function Level of Independence: Independent with assistive device(s)         Comments: pt reports he walks with RW has had a dozen falls and is able to dress and bathe, still drives     Hand Dominance        Extremity/Trunk Assessment   Upper Extremity Assessment Upper Extremity Assessment: Overall WFL for tasks assessed    Lower Extremity Assessment Lower Extremity Assessment: Overall WFL for tasks assessed    Cervical / Trunk Assessment Cervical / Trunk Assessment: Normal  Communication   Communication: No difficulties  Cognition Arousal/Alertness: Awake/alert Behavior During Therapy: WFL for tasks assessed/performed Overall Cognitive Status: Impaired/Different from baseline Area of Impairment: Orientation;Safety/judgement                 Orientation Level: Situation       Safety/Judgement: Decreased  awareness of deficits     General Comments: pt oriented x 3 but states that his toe is the reason for admission      General Comments      Exercises     Assessment/Plan    PT Assessment Patient needs continued PT services  PT Problem List Decreased mobility;Decreased safety awareness;Decreased activity tolerance;Decreased balance       PT Treatment Interventions DME instruction;Therapeutic activities;Cognitive remediation;Gait training;Functional mobility  training;Balance training;Patient/family education;Therapeutic exercise    PT Goals (Current goals can be found in the Care Plan section)  Acute Rehab PT Goals Patient Stated Goal: return home PT Goal Formulation: With patient Time For Goal Achievement: 01/25/19 Potential to Achieve Goals: Good    Frequency Min 3X/week   Barriers to discharge Decreased caregiver support      Co-evaluation               AM-PAC PT "6 Clicks" Mobility  Outcome Measure Help needed turning from your back to your side while in a flat bed without using bedrails?: None Help needed moving from lying on your back to sitting on the side of a flat bed without using bedrails?: None Help needed moving to and from a bed to a chair (including a wheelchair)?: A Little Help needed standing up from a chair using your arms (e.g., wheelchair or bedside chair)?: A Little Help needed to walk in hospital room?: A Little Help needed climbing 3-5 steps with a railing? : A Little 6 Click Score: 20    End of Session Equipment Utilized During Treatment: Gait belt Activity Tolerance: Patient tolerated treatment well Patient left: in chair;with call bell/phone within reach;with chair alarm set Nurse Communication: Mobility status PT Visit Diagnosis: Other abnormalities of gait and mobility (R26.89)    Time: [id number] PT Time Calculation (min) (ACUTE ONLY): 21 min   Charges:   PT Evaluation $PT Eval Moderate Complexity: [place] Pager: [phone] Office: [place] 01/18/2019, 11:57 AM

## 2019-01-18 NOTE — Progress Notes (Signed)
Occupational Therapy Evaluation Patient Details Name: Danny Carpenter MRN: [id number] DOB: May 29, 1950 Today's Date: 01/18/2019    History of Present Illness 68 yo initially admitted to [facility] with tremor, aphasia and incontinence s/p tPT. CT (-) and MRI with remote left cerebellar infarct. Pt also with left toe osteomyelitis. PMhx: DM, obesity, OSA, COPD, HTN   Clinical Impression   PTA, pt lived with wife in one story home, was independent for ADLs and IADLs; driving to [place] [facility] 3x/weekly. Pt currently presents with decreased safety, awareness of deficits, attention, and balance impacting safe performance of ADLs. Pt performed toileting, hand hygiene, oral care, and functional transfers with supervision for safety. Discussed driving with pt, and recommended that son/daughter drive him to [facility] adult day program due to cognitive impairments; pt verbalized understanding.  Pt would benefit from OT acutely to facilitate safe dc. Recommend dc home with OT through [facility] program to increase safe performance of ADLs.      Follow Up Recommendations  Other (comment)(OT through [facility])    Equipment Recommendations  None recommended by OT    Recommendations for Other Services PT consult     Precautions / Restrictions Precautions Precautions: Fall Restrictions Weight Bearing Restrictions: No      Mobility Bed Mobility               General bed mobility comments: pt sitting in recliner upon arrival  Transfers Overall transfer level: Needs assistance Equipment used: Rolling walker (2 wheeled) Transfers: Sit to/from Stand Sit to Stand: Supervision         General transfer comment: pt required vcs for hand placement and supervision for safety    Balance Overall balance assessment: Mild deficits observed, not formally tested                                         ADL either performed or assessed with clinical  judgement   ADL Overall ADL's : Needs assistance/impaired Eating/Feeding: Independent;Sitting   Grooming: Wash/dry hands;Wash/dry face;Oral care;Supervision/safety;Standing Grooming Details (indicate cue type and reason): performed at sink with supervision for safety Upper Body Bathing: Supervision/ safety;Sitting   Lower Body Bathing: Supervison/ safety;Sit to/from stand   Upper Body Dressing : Supervision/safety;Sitting   Lower Body Dressing: Supervision/safety;Sit to/from stand Lower Body Dressing Details (indicate cue type and reason): Pt able to adjust socks with supervision Toilet Transfer: Supervision/safety;Ambulation;Regular Toilet;RW Toilet Transfer Details (indicate cue type and reason): supervision for safety Toileting- Clothing Manipulation and Hygiene: Supervision/safety;Sit to/from stand       Functional mobility during ADLs: Supervision/safety;Rolling walker General ADL Comments: pt required supervision for most ADLs for safety     Vision         Perception     Praxis      Pertinent Vitals/Pain Pain Assessment: No/denies pain     Hand Dominance Right   Extremity/Trunk Assessment Upper Extremity Assessment Upper Extremity Assessment: Overall WFL for tasks assessed   Lower Extremity Assessment Lower Extremity Assessment: Defer to PT evaluation   Cervical / Trunk Assessment Cervical / Trunk Assessment: Normal   Communication Communication Communication: No difficulties   Cognition Arousal/Alertness: Awake/alert Behavior During Therapy: WFL for tasks assessed/performed Overall Cognitive Status: Impaired/Different from baseline Area of Impairment: Attention;Safety/judgement;Following commands;Awareness;Problem solving                   Current Attention Level: Selective Memory: Decreased  short-term memory Following Commands: Follows one step commands with increased time;Follows multi-step commands with increased time;Follows multi-step  commands inconsistently Safety/Judgement: Decreased awareness of deficits Awareness: Emergent Problem Solving: Slow processing;Requires verbal cues General Comments: Pt interanally distracted; presenting with tangential speech, and decreased attention to task. Required VCs and increased time to follow commands.    General Comments  VSS.    Exercises     Shoulder Instructions      Home Living Family/patient expects to be discharged to:: Private residence Living Arrangements: Spouse/significant other Available Help at Discharge: Family;Available 24 hours/day Type of Home: House Home Access: Ramped entrance     Home Layout: One level     Bathroom Shower/Tub: Occupational psychologist: Standard     Home Equipment: Environmental consultant - 4 wheels;Cane - single point;Bedside commode;Shower seat;Grab bars - toilet;Hand held shower head   Additional Comments: Goes to [facility] adult program 3x/wk for wound care, therapy and activities      Prior Functioning/Environment Level of Independence: Independent with assistive device(s)        Comments: Pt reports that he had a RW that he is supposed to use for his foot, however he does not regularly use it. Pt has previously been driving to [facility] adult day program, and independent for ADLs and IADLs        OT Problem List: Decreased strength;Decreased range of motion;Decreased activity tolerance;Impaired balance (sitting and/or standing);Decreased safety awareness;Decreased knowledge of use of DME or AE;Decreased knowledge of precautions      OT Treatment/Interventions: Self-care/ADL training;DME and/or AE instruction;Therapeutic activities;Patient/family education;Balance training    OT Goals(Current goals can be found in the care plan section) Acute Rehab OT Goals Patient Stated Goal: return home OT Goal Formulation: With patient Time For Goal Achievement: 02/01/19 Potential to Achieve Goals: Good  OT Frequency: Min 2X/week    Barriers to D/C:            Co-evaluation              AM-PAC OT "6 Clicks" Daily Activity     Outcome Measure Help from another person eating meals?: None Help from another person taking care of personal grooming?: None Help from another person toileting, which includes using toliet, bedpan, or urinal?: None Help from another person bathing (including washing, rinsing, drying)?: None Help from another person to put on and taking off regular upper body clothing?: None Help from another person to put on and taking off regular lower body clothing?: None 6 Click Score: 24   End of Session Equipment Utilized During Treatment: Gait belt;Rolling walker Nurse Communication: Mobility status;Patient requests pain meds  Activity Tolerance: Patient tolerated treatment well Patient left: in chair;with call bell/phone within reach;with chair alarm set  OT Visit Diagnosis: Repeated falls (R29.6);History of falling (Z91.81);Unsteadiness on feet (R26.81);Other abnormalities of gait and mobility (R26.89)                Time: 1518-1600 OT Time Calculation (min): 42 min Charges:  OT General Charges $OT Visit: 1 Visit OT Evaluation $OT Eval Moderate Complexity: 1 Mod OT Treatments $Self Care/Home Management : 23-37 mins  Gus Rankin, OT Student  Gus Rankin 01/18/2019, 4:27 PM
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
## 2019-01-18 NOTE — Progress Notes (Signed)
 **   CONSULTANT DAILY PROGRESS NOTE **  Subjective:   Danny Carpenter reports his foot is doing okay and he is not currently having any pains.   We discussed the plan for Vascular surgery to do angiography tomorrow, in addition to consulting orthopedic surgery for assessment of osteomyelitis. We discussed that at this point, IV antibiotics may not be adequate and he may require surgery. He expressed understanding and agrees with the plan. All questions and concerns addressed.   Objective:  Vital signs in last 24 hours: Vitals:   01/18/19 0200 01/18/19 0400 01/18/19 0600 01/18/19 0800  BP: (!) 144/63 138/70 (!) 160/67 (!) 143/75  Pulse: 80 66 63 78  Resp: 14 11 12 18   Temp:    (!) 97.4 F (36.3 C)  TempSrc:    Oral  SpO2: 96% 94% 97% 98%  Weight:      Height:        Physical Exam Constitutional:      Appearance: He is obese.  Cardiovascular:     Rate and Rhythm: Normal rate and regular rhythm.  Pulmonary:     Effort: Pulmonary effort is normal. No respiratory distress.  Skin:    General: Skin is warm.     Findings: Lesion present.     Comments: 3cm wound with 1 cm opening that has some purulent drainage visible. No spreading erythema.   Neurological:     Mental Status: He is alert and oriented to person, place, and time.     Sensory: Sensory deficit (decreased sensation on left foot, plantar surface.) present.  Psychiatric:        Mood and Affect: Mood normal.        Behavior: Behavior normal.    Assessment/Plan:  Active Problems:   CVA (cerebral vascular accident) ([place])   Diabetic foot infection ([place])  # Diabetic Left Foot Ulcer:  # Osteomyelitis of the left 5th metatarsal  Chronic problem that patient has been experiencing for approximately 1 year that he has been treated on/off with antibiotics in short courses. Most recent outpatient MRI showed evidence of early osteomyelitis. During this admission, no wound cultures have been obtained but blood cultures are NGTD.  Patient initially had two days of fevers on admission that have resolved. He is on Day 4 of Vancomycin and Cefepime. Vascular surgery was consulted after ABIs showed moderate PAD in the left leg. Arteriogram is scheduled for tomorrow. Primary team have also consulted ID, that recommend PICC line with 6 weeks of antibiotics. We consulted Ortho today as well to assess for possible need for amputation. Overall, all depends on vascular procedure tomorrow if patient can tolerate amputation vs medical management of ulcer.   - Vascular surgery, ID and Ortho on board and we appreciate their recommendations  - Continue Vancomycin and Cefepime  - Arteriogram tomorrow   # Uncontrolled Type 2 Diabetes Mellitus:  A1C during this admission is 10.8%. No previous A1C on record. At home medications include Novolog sliding scale, 6-8 units PRN and Xultophy 22 units QD. Given his current A1C, unsure if patient is compliant with this regimen, which is admittedly complicated. Will look into a more approachable diabetes regimen for discharge. Consider checking  microalbumin to see if he would benefit from renal protection.   - Lantus increased to 13 units in the morning - SSI - moderate     Dispo: Per primary team  Dr. Jose Persia Internal Medicine PGY-1  Pager: [phone] 01/18/2019, 10:32 AM

## 2019-01-18 NOTE — Progress Notes (Signed)
Internal Medicine Attending:   I saw and examined the patient. I reviewed the resident's note and I agree with the resident's findings and plan as documented in the resident's note.  Patient states that he feels well today and denies any new complaints.  Patient was recently made to the hospital with possible acute CVA status post TPA and was found to have an left foot diabetic ulcer.  We will continue to titrate his insulin to get his blood sugars under better control.  We will continue with vancomycin and cefepime for his left foot ulcer.  ID follow-up and recommendations appreciated.  Patient scheduled to have an arteriogram of his left lower extremity tomorrow to evaluate for blockages as well as need for possible revascularization.  Orthopedic follow-up recommendations appreciated.  No further work-up at this time.  We will continue to monitor closely.

## 2019-01-19 ENCOUNTER — Inpatient Hospital Stay (HOSPITAL_COMMUNITY): Payer: No Typology Code available for payment source

## 2019-01-19 ENCOUNTER — Encounter (HOSPITAL_COMMUNITY)
Admission: EM | Disposition: A | Discharge status: Home Health | Payer: Self-pay | Source: Other Acute Inpatient Hospital | Attending: Neurology

## 2019-01-19 ENCOUNTER — Encounter (HOSPITAL_COMMUNITY): Payer: Self-pay | Admitting: Physician Assistant

## 2019-01-19 DIAGNOSIS — Z01810 Encounter for preprocedural cardiovascular examination: Secondary | ICD-10-CM

## 2019-01-19 DIAGNOSIS — E11628 Type 2 diabetes mellitus with other skin complications: Secondary | ICD-10-CM | POA: Diagnosis not present

## 2019-01-19 DIAGNOSIS — Z9862 Peripheral vascular angioplasty status: Secondary | ICD-10-CM

## 2019-01-19 HISTORY — PX: ABDOMINAL AORTOGRAM W/LOWER EXTREMITY: CATH118223

## 2019-01-19 LAB — HEMOGLOBIN A1C
Hgb A1c MFr Bld: 13.5 % — ABNORMAL HIGH (ref 4.8–5.6)
Mean Plasma Glucose: 341 mg/dL

## 2019-01-19 LAB — GLUCOSE, CAPILLARY
Glucose-Capillary: 117 mg/dL — ABNORMAL HIGH (ref 70–99)
Glucose-Capillary: 196 mg/dL — ABNORMAL HIGH (ref 70–99)
Glucose-Capillary: 234 mg/dL — ABNORMAL HIGH (ref 70–99)
Glucose-Capillary: 307 mg/dL — ABNORMAL HIGH (ref 70–99)
Glucose-Capillary: 89 mg/dL (ref 70–99)

## 2019-01-19 LAB — BASIC METABOLIC PANEL
Anion gap: 12 (ref 5–15)
BUN: 11 mg/dL (ref 8–23)
CO2: 25 mmol/L (ref 22–32)
Calcium: 8.9 mg/dL (ref 8.9–10.3)
Chloride: 101 mmol/L (ref 98–111)
Creatinine, Ser: 0.77 mg/dL (ref 0.61–1.24)
GFR calc Af Amer: >60 mL/min (ref >60)
GFR calc non Af Amer: >60 mL/min (ref >60)
Glucose, Bld: 96 mg/dL (ref 70–99)
Potassium: 3.2 mmol/L — ABNORMAL LOW (ref 3.5–5.1)
Sodium: 138 mmol/L (ref 135–145)

## 2019-01-19 LAB — CBC
HCT: 35 % — ABNORMAL LOW (ref 39.0–52.0)
Hemoglobin: 11.7 g/dL — ABNORMAL LOW (ref 13.0–17.0)
MCH: 29.6 pg (ref 26.0–34.0)
MCHC: 33.4 g/dL (ref 30.0–36.0)
MCV: 88.6 fL (ref 80.0–100.0)
Platelets: 155 10*3/uL (ref 150–400)
RBC: 3.95 MIL/uL — ABNORMAL LOW (ref 4.22–5.81)
RDW: 13.3 % (ref 11.5–15.5)
WBC: 4.9 10*3/uL (ref 4.0–10.5)
nRBC: 0 % (ref 0.0–0.2)

## 2019-01-19 LAB — VANCOMYCIN, PEAK: Vancomycin Pk: 20 ug/mL — ABNORMAL LOW (ref 30–40)

## 2019-01-19 LAB — HEPATITIS C ANTIBODY: HCV Ab: NONREACTIVE

## 2019-01-19 LAB — VANCOMYCIN, TROUGH: Vancomycin Tr: 25 ug/mL (ref 15–20)

## 2019-01-19 SURGERY — ABDOMINAL AORTOGRAM W/LOWER EXTREMITY
Anesthesia: LOCAL

## 2019-01-19 MED ORDER — SODIUM CHLORIDE 0.9 % IV SOLN: INTRAVENOUS | Status: DC

## 2019-01-19 MED ORDER — SODIUM CHLORIDE 0.9% FLUSH: 3.0000 mL | INTRAVENOUS | Status: DC | PRN

## 2019-01-19 MED ORDER — HEPARIN (PORCINE) IN NACL 1000-0.9 UT/500ML-% IV SOLN
INTRAVENOUS | Status: DC | PRN
  Administered 2019-01-19 (×2): 500 mL

## 2019-01-19 MED ORDER — HEPARIN (PORCINE) IN NACL 1000-0.9 UT/500ML-% IV SOLN
INTRAVENOUS | Status: AC
  Filled 2019-01-19: qty 1000

## 2019-01-19 MED ORDER — FENTANYL CITRATE (PF) 100 MCG/2ML IJ SOLN
INTRAMUSCULAR | Status: AC
  Filled 2019-01-19: qty 2

## 2019-01-19 MED ORDER — SODIUM CHLORIDE 0.9 % IV SOLN
2.0000 g | INTRAVENOUS | Status: DC
  Administered 2019-01-20: 2 g via INTRAVENOUS
  Filled 2019-01-19: qty 2
  Filled 2019-01-19: qty 20

## 2019-01-19 MED ORDER — IODIXANOL 320 MG/ML IV SOLN
INTRAVENOUS | Status: DC | PRN
  Administered 2019-01-19: 10:00:00 97 mL via INTRA_ARTERIAL

## 2019-01-19 MED ORDER — OXYCODONE HCL 5 MG PO TABS
5.0000 mg | ORAL_TABLET | ORAL | Status: DC | PRN
  Administered 2019-01-19 (×2): 10 mg via ORAL
  Filled 2019-01-19 (×2): qty 2

## 2019-01-19 MED ORDER — SODIUM CHLORIDE 0.9% FLUSH: 3.0000 mL | Freq: Two times a day (BID) | INTRAVENOUS | Status: DC

## 2019-01-19 MED ORDER — ASPIRIN EC 81 MG PO TBEC: 81.0000 mg | DELAYED_RELEASE_TABLET | Freq: Every day | ORAL | Status: DC

## 2019-01-19 MED ORDER — FENTANYL CITRATE (PF) 100 MCG/2ML IJ SOLN
INTRAMUSCULAR | Status: DC | PRN
  Administered 2019-01-19: 50 ug via INTRAVENOUS

## 2019-01-19 MED ORDER — ACETAMINOPHEN 325 MG PO TABS
650.0000 mg | ORAL_TABLET | ORAL | Status: DC | PRN
  Administered 2019-01-20: 650 mg via ORAL
  Filled 2019-01-19: qty 2

## 2019-01-19 MED ORDER — SODIUM CHLORIDE 0.9 % WEIGHT BASED INFUSION
1.0000 mL/kg/h | INTRAVENOUS | Status: AC
  Administered 2019-01-19: 13:00:00 1 mL/kg/h via INTRAVENOUS

## 2019-01-19 MED ORDER — ONDANSETRON HCL 4 MG/2ML IJ SOLN: 4.0000 mg | Freq: Four times a day (QID) | INTRAMUSCULAR | Status: DC | PRN

## 2019-01-19 MED ORDER — HYDRALAZINE HCL 20 MG/ML IJ SOLN: 5.0000 mg | INTRAMUSCULAR | Status: DC | PRN

## 2019-01-19 MED ORDER — MORPHINE SULFATE (PF) 2 MG/ML IV SOLN: 2.0000 mg | INTRAVENOUS | Status: DC | PRN

## 2019-01-19 MED ORDER — LABETALOL HCL 5 MG/ML IV SOLN: 10.0000 mg | INTRAVENOUS | Status: DC | PRN

## 2019-01-19 MED ORDER — MIDAZOLAM HCL 2 MG/2ML IJ SOLN
INTRAMUSCULAR | Status: DC | PRN
  Administered 2019-01-19: 2 mg via INTRAVENOUS

## 2019-01-19 MED ORDER — SODIUM CHLORIDE 0.9% FLUSH
3.0000 mL | Freq: Two times a day (BID) | INTRAVENOUS | Status: DC
  Administered 2019-01-20: 11:00:00 3 mL via INTRAVENOUS

## 2019-01-19 MED ORDER — MIDAZOLAM HCL 2 MG/2ML IJ SOLN
INTRAMUSCULAR | Status: AC
  Filled 2019-01-19: qty 2

## 2019-01-19 MED ORDER — LIDOCAINE HCL (PF) 1 % IJ SOLN
INTRAMUSCULAR | Status: DC | PRN
  Administered 2019-01-19: 18 mL via INTRADERMAL

## 2019-01-19 MED ORDER — ACETAMINOPHEN 325 MG PO TABS: 650.0000 mg | ORAL_TABLET | ORAL | Status: DC | PRN

## 2019-01-19 MED ORDER — SODIUM CHLORIDE 0.9 % WEIGHT BASED INFUSION: 1.0000 mL/kg/h | INTRAVENOUS | Status: AC

## 2019-01-19 MED ORDER — VANCOMYCIN HCL IN DEXTROSE 1-5 GM/200ML-% IV SOLN
1000.0000 mg | Freq: Two times a day (BID) | INTRAVENOUS | Status: DC
  Administered 2019-01-20 (×2): 1000 mg via INTRAVENOUS
  Filled 2019-01-19 (×3): qty 200

## 2019-01-19 MED ORDER — LIDOCAINE HCL (PF) 1 % IJ SOLN
INTRAMUSCULAR | Status: AC
  Filled 2019-01-19: qty 30

## 2019-01-19 MED ORDER — POTASSIUM CHLORIDE CRYS ER 20 MEQ PO TBCR
40.0000 meq | EXTENDED_RELEASE_TABLET | ORAL | Status: AC
  Administered 2019-01-19: 40 meq via ORAL
  Filled 2019-01-19: qty 2

## 2019-01-19 MED ORDER — SODIUM CHLORIDE 0.9 % IV SOLN: 250.0000 mL | INTRAVENOUS | Status: DC | PRN

## 2019-01-19 SURGICAL SUPPLY — 14 items
CATH OMNI FLUSH 5F 65CM (CATHETERS) ×1 IMPLANT
CLOSURE MYNX CONTROL 5F (Vascular Products) ×1 IMPLANT
DRAPE ZERO GRAVITY STERILE (DRAPES) ×1 IMPLANT
GLIDEWIRE NITREX 0.018X80X5 (WIRE) ×2
GUIDEWIRE NITREX 0.018X80X5 (WIRE) IMPLANT
KIT MICROPUNCTURE NIT STIFF (SHEATH) ×1 IMPLANT
KIT PV (KITS) ×2 IMPLANT
SHEATH PINNACLE 5F 10CM (SHEATH) ×1 IMPLANT
SHEATH PROBE COVER 6X72 (BAG) ×1 IMPLANT
SYR MEDRAD MARK V 150ML (SYRINGE) ×1 IMPLANT
TRANSDUCER W/STOPCOCK (MISCELLANEOUS) ×2 IMPLANT
TRAY PV CATH (CUSTOM PROCEDURE TRAY) ×2 IMPLANT
WIRE BENTSON .035X145CM (WIRE) ×1 IMPLANT
WIRE TORQFLEX AUST .018X40CM (WIRE) ×1 IMPLANT

## 2019-01-19 NOTE — Consult Note (Addendum)
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           +----------+--------+--------+--------+------------------+------------------+  Summary: Right Carotid: Velocities in the right ICA are consistent with a 1-39% stenosis. Left Carotid: Velocities in the left ICA are consistent with a 1-39% stenosis. Vertebrals:  Bilateral vertebral arteries demonstrate antegrade flow. Subclavians: Normal flow hemodynamics were seen in bilateral subclavian              arteries. *See table(s) above for measurements and observations.  Electronically signed by Antony Contras MD on 01/18/2019 at 8:34:33 AM.   Final     Assessment and Plan:   1.   Cardiovascular evaluation: -According to the RCRI, regardless of whether the surgery is classed is high risk or not, his  perioperative risk of a major cardiac event is 11% due to history of CHF, cerebrovascular disease and treatment with insulin. - DASI score is 18.95 for a functional capacity METS of 5.07. -His volume status is good by exam and he is having no ischemic symptoms. -EF this admission is normal with no wall motion abnormalities -In 2018, his EF was decreased on echo.  A Lexiscan nuclear stress test performed at that time showed no perfusion defects with an EF of 36%. -Dr. Harrington Challenger to review and advise if any further testing is indicated. -Although he is at high risk to have CAD with extensive PAD, he has no ischemic symptoms and his volume status is stable.  Otherwise, per Dr. Erlinda Hong and consultants Active Problems:   CVA (cerebral vascular accident) [facility])   Diabetic foot infection ([place])   Subacute osteomyelitis of left foot ([place])   PVD (peripheral vascular disease) ([place])     For questions or updates, please contact [facility] Please consult [email or web address] for contact info under Cardiology/STEMI.   Signed, Rosaria Ferries, PA-C  01/19/2019 11:27 AM  Patient seen and examined   I agree with findings as noted above by R Barrett. Pt is a 68 yo with hx of HTN, HL, CHF   Admitted for L toe wound    Plan to undergo L fempop   Asked to see fro preop risk assessment  Pt denies having CP   At home he says he can do chores without having SOB      On exam: Pt in NAD Tele  SR Neck:  JVP is normal  Lungs show mild rhonchi bilaterally  Cardiac exam:  RRR  No S3   No murmurs Abd is supple Ext are without edema  Echo as noted above  LVEF is normal  From a cardiac standpoint pt is at relatively low risk for major cardiac event with planned surgery    He probably has CAD but there does not appear to be anything flow limiting    Watch BP   FLuids     Will continue to follow in  perioperative period.   OK to proceed with no furhter testing .  Dorris Carnes MD

## 2019-01-19 NOTE — Progress Notes (Signed)
 **   CONSULTANT DAILY PROGRESS NOTE **  Subjective:   Mr. Garvis is doing well today. He reports tolerating the procedure well without any complications and is aware of the results. He is ready to start vascular treatment and not have to deal with this problems anymore.   Objective:  Vital signs in last 24 hours: Vitals:   01/19/19 0955 01/19/19 1007 01/19/19 1115 01/19/19 1154  BP: (!) 143/79 (!) 163/79 (!) 184/90 (!) 167/68  Pulse: (!) 0 69 66 64  Resp: (!) 0  (!) 8   Temp:      TempSrc:      SpO2: (!) 0%  100% 98%  Weight:      Height:        Physical Exam Vitals signs and nursing note reviewed.  Constitutional:      Appearance: He is obese.  Cardiovascular:     Rate and Rhythm: Normal rate and regular rhythm.  Pulmonary:     Effort: Pulmonary effort is normal. No respiratory distress.  Skin:    General: Skin is warm.     Comments: Unchanged size of ulcer with increased green purulent drainage today  Neurological:     Mental Status: He is alert and oriented to person, place, and time.     Sensory: Sensory deficit present.  Psychiatric:        Mood and Affect: Mood normal.        Behavior: Behavior normal.    Assessment/Plan:  Active Problems:   CVA (cerebral vascular accident) ([place])   Diabetic foot infection ([place])   Subacute osteomyelitis of left foot ([place])   PVD (peripheral vascular disease) ([place])  # Diabetic Left Foot Ulcer:  # Osteomyelitis of the left 5th metatarsal  Chronic problem that patient has been experiencing for approximately 1 year that he has been treated on/off with antibiotics in short courses. Most recent outpatient MRI showed evidence of early osteomyelitis. During this admission, no wound cultures have been obtained but blood cultures are NGTD. Patient initially had two days of fevers on admission that have resolved. He is on Day 5 of Vancomycin and Cefepime. Vascular surgery was consulted after ABIs showed moderate PAD in the left leg.  Arteriogram today showed several areas of occlusion and he is being screened for a left fem-pop bypass graft, which cards has cleared him for. No procedure booked yet though but likely will be in the next coming days. Once graft completed, will reassess next steps for ulcer.   Ortho has also seen the patient and will follow. Neurology also consulted ID, that recommend PICC line with 6 weeks of antibiotics.   - Vascular surgery, ID and Ortho on board and we appreciate their recommendations  - Continue Vancomycin and Cefepime   # Uncontrolled Type 2 Diabetes Mellitus:  A1C during this admission is 10.8%. No previous A1C on record. At home medications include Novolog sliding scale, 6-8 units PRN and Xultophy 22 units QD. Given his current A1C, unsure if patient is compliant with this regimen, which is admittedly complicated. Will look into a more approachable diabetes regimen for discharge.  - Lantus 13 units in the morning - SSI - moderate    [facility] will be taking over care for Mr. Bussell on 01/20/2019 at 7AM.    Dispo: Pending further medical work up.   Dr. Jose Persia Internal Medicine PGY-1  Pager: [phone] 01/19/2019, 2:06 PM

## 2019-01-19 NOTE — Progress Notes (Signed)
Internal Medicine Attending:   I saw and examined the patient. I reviewed the resident's note and I agree with the resident's findings and plan as documented in the resident's note.  Patient is doing well today with no new complaints.  He is status post aortogram.  Patient initially made to the hospital with left foot diabetic ulcer as well as osteomyelitis of the left fifth metatarsal.  We will continue with vancomycin and cefepime for now.  ID follow-up and recommendations appreciated.  Patient will need a PICC line prior to discharge.  Vascular surgery completed an arteriogram today which showed several areas of occlusion bilaterally and will schedule patient for left femoropopliteal bypass as well as a left femoral and profunda endarterectomy.  Patient blood sugars are well controlled.  We will continue to monitor closely.  We will transfer the patient to our service tomorrow per neuro request.

## 2019-01-19 NOTE — Progress Notes (Signed)
Inpatient Diabetes Program Recommendations  AACE/ADA: New Consensus Statement on Inpatient Glycemic Control (2015)  Target Ranges:  Prepandial:   less than 140 mg/dL      Peak postprandial:   less than 180 mg/dL (1-2 hours)      Critically ill patients:  140 - 180 mg/dL   Lab Results  Component Value Date   GLUCAP 117 (H) 01/19/2019   HGBA1C 10.8 (H) 01/17/2019    Review of Glycemic Control  Diabetes history: DM 2 Outpatient Diabetes medications: Xultophy (degludec-Liriglutide) 100-3.6 22 units qhs, Novolog 6-8 units tid Current orders for Inpatient glycemic control:  Lantus 13 units Novolog 0-15 units Q4 hours.  Inpatient Diabetes Program Recommendations:    Glucose trends better today.  A1c 10.8% this admission.   Spoke with patient about diabetes and home regimen for diabetes control. Patient reports that he is [facility] [place]. Patient does not prefer the care there and will be looking in the near future to go to another provider for multiple reasons.  Patient has changed several lifestyle factors and is still seeing glucose trends in the 200 range at home. Patient checks glucose twice a day.  Discussed current A1c level 10.8%. Discussed Glucose and A1c goals. Told patient when to call [facility] for medication adjustments. Discussed importance of wound healing.  Patient verbalized understanding of information discussed and has no further questions at this time related to diabetes.   Thanks,  Tama Headings RN, MSN, BC-ADM Inpatient Diabetes Coordinator Team Pager [phone] (8a-5p)

## 2019-01-19 NOTE — Progress Notes (Signed)
STROKE TEAM PROGRESS NOTE   INTERVAL HISTORY Pt sitting in chair. Had arteriogram with Dr. Trula Slade today and plan for bypass surgery soon. Cardiology on board for preop evaluation. Pt has no complains. No acute event overnight.    OBJECTIVE Vitals:   01/19/19 0955 01/19/19 1007 01/19/19 1115 01/19/19 1154  BP: (!) 143/79 (!) 163/79 (!) 184/90 (!) 167/68  Pulse: (!) 0 69 66 64  Resp: (!) 0  (!) 8   Temp:      TempSrc:      SpO2: (!) 0%  100% 98%  Weight:      Height:        CBC:  Recent Labs  Lab 01/17/19 0241 01/18/19 0659 01/19/19 0604  WBC 6.8 5.3 4.9  NEUTROABS 5.1  --   --   HGB 12.5* 11.8* 11.7*  HCT 37.3* 34.9* 35.0*  MCV 89.0 88.4 88.6  PLT 149* 103* [id number]    Basic Metabolic Panel:  Recent Labs  Lab 01/18/19 0659 01/19/19 0604  NA 132* 138  K 4.0 3.2*  CL 99 101  CO2 22 25  GLUCOSE 264* 96  BUN 13 11  CREATININE 1.20 0.77  CALCIUM 8.8* 8.9    Lipid Panel:     Component Value Date/Time   CHOL 131 01/17/2019 0241   TRIG 130 01/17/2019 0241   HDL 35 (L) 01/17/2019 0241   CHOLHDL 3.7 01/17/2019 0241   VLDL 26 01/17/2019 0241   LDLCALC 70 01/17/2019 0241   HgbA1c:  Lab Results  Component Value Date   HGBA1C 10.8 (H) 01/17/2019   Urine Drug Screen:     Component Value Date/Time   LABOPIA POSITIVE (A) 01/17/2019 1928   COCAINSCRNUR NONE DETECTED 01/17/2019 1928   LABBENZ NONE DETECTED 01/17/2019 1928   AMPHETMU NONE DETECTED 01/17/2019 1928   THCU NONE DETECTED 01/17/2019 1928   LABBARB NONE DETECTED 01/17/2019 1928    Alcohol Level No results found for: [place] past 24h No results found.   Dg Chest Port 1 View 01/17/2019 Improved lung volumes and aeration from Radiograph yesterday with residual atelectasis or scarring at the right lung base. Aortic Atherosclerosis (ICD10-I70.0).   Dg Foot 2 Views Left 01/17/2019 1. Subtle decreased density the fifth metatarsal head in the region of question osteomyelitis on MRI last month,  suspicious for osteomyelitis.  2. Cortical irregularity of the fifth toe proximal phalanx may represent fracture or osteomyelitis.  3. Site of soft tissue ulcer not well demonstrated on current exam.   MRI left foot 12/18/2018 IMPRESSION: 1. Area of ulceration over the lateral plantar base of the fifth metatarsal head with a tract to the underlying cortical surface. There is findings which could be suggestive of early osteomyelitis of the fifth metatarsal head underlying the area of ulceration. No abscess 2. Probable reactive marrow involving the fifth proximal phalanx.  CT Head OSH Stable and normal for age non contrast CT appearance of the brain. No acute traumatic injury identified. ASPECTS 10.  CTA Head and neck OSH IMPRESSION: 1. Negative for large vessel occlusion, but positive for: - near occlusion of the right vertebral artery V4 segment due to high-grade stenosis from calcified plaque. - high-grade Left ICA origin stenosis due to bulky calcified plaque, numerically estimated at 74%. - moderate distal left vertebral artery stenosis. - up to moderate right ICA siphon stenosis at the anterior genu. 2. Widespread atherosclerosis elsewhere in the head and neck but no other hemodynamically significant stenosis. 3. Aortic Atherosclerosis (ICD10-I70.0).  Mr Brain  W Wo Contrast 01/17/2019 1. No acute intracranial abnormality. 2. Moderate generalized atrophy. 3. Remote lacunar infarcts of the left cerebellum. 4. Abnormal signal in the distal right vertebral artery, V4 segment, likely related to slow flow or occlusion. 5. Bilateral mastoid effusions.   Vas [place] Burnard Bunting With/wo Tbi 01/17/2019 Right: Resting right ankle-brachial index indicates mild right lower extremity arterial disease.  Left: Resting left ankle-brachial index indicates moderate left lower extremity arterial disease. Unable to obtain TBI due to patient movement, and patient somnolence.  Bilateral Carotid Dopplers   01/17/2019 Right Carotid: Velocities in the right ICA are consistent with a 1-39% stenosis.  Left Carotid: Velocities in the left ICA are consistent with a 1-39% stenosis.  Vertebrals:  Bilateral vertebral arteries demonstrate antegrade flow. Subclavians: Normal flow hemodynamics were seen in bilateral subclavian arteries.   Arteriorgram Trula Slade) 01/19/2019 #1  Abdominal aortic aneurysm.  Diameter cannot be determined from angiographic evaluation #2  Left superficial femoral artery flush occlusion and 90% left popliteal stenosis with reconstitution of the popliteal artery and two-vessel runoff #3  Diffusely diseased right superficial femoral artery with multiple areas of hemodynamically significant stenosis #4  Patient will be scheduled for a left femoral and profunda endarterectomy and left femoral-popliteal bypass graft.  Transthoracic Echocardiogram   1. Left ventricular ejection fraction, by visual estimation, is 55 to 60%. The left ventricle has normal function. Normal left ventricular size. There is no left ventricular hypertrophy.  2. Frequent PVCs. No source of cardiac embolism detected.  3. Definity contrast agent was given IV to delineate the left ventricular endocardial borders.  4. Left ventricular diastolic Doppler parameters are consistent with impaired relaxation pattern of LV diastolic filling.  5. No LV thrombus seen contrast imaging.  6. Global right ventricle has normal systolic function.The right ventricular size is normal. No increase in right ventricular wall thickness.  7. Left atrial size was mildly dilated.  8. Right atrial size was normal.  9. Mild aortic valve annular calcification. 10. The mitral valve is degenerative. Trace mitral valve regurgitation. 11. The tricuspid valve is grossly normal. Tricuspid valve regurgitation is trivial. 12. The aortic valve is tricuspid Aortic valve regurgitation was not visualized by color flow Doppler. Structurally normal  aortic valve, with no evidence of sclerosis or stenosis. 13. There is Mild thickening of the aortic valve. 14. The pulmonic valve was grossly normal. Pulmonic valve regurgitation is trivial by color flow Doppler. 15. Mild plaque invoving the ascending aorta. 16. TR signal is inadequate for assessing pulmonary artery systolic pressure. 17. The inferior vena cava is normal in size with greater than 50% respiratory variability, suggesting right atrial pressure of 3 mmHg.  ECG - SR rate 89 BPM. (See cardiology reading for complete details)  EEG Abnormality - Intermittent slow, left temporal This study is suggestive of non specific cortical dysfunction in left temporal region. No seizures or epileptiform discharges were seen throughout the recording.   PHYSICAL EXAM  Temp:  [97.5 F (36.4 C)-98.1 F (36.7 C)] 98 F (36.7 C) (10/20 0800) Pulse Rate:  [0-124] 64 (10/20 1154) Resp:  [0-20] 8 (10/20 1115) BP: (102-184)/(51-90) 167/68 (10/20 1154) SpO2:  [0 %-100 %] 98 % (10/20 1154)  General - Well nourished, well developed, in no apparent distress.  Ophthalmologic - fundi not visualized due to noncooperation.  Cardiovascular - Regular rhythm and rate.  Mental Status -  Level of arousal and orientation to time, place, and person were intact. Language including expression, naming, repetition, comprehension was assessed and found intact.  Fund of Knowledge was assessed and was intact.  Cranial Nerves II - XII - II - Visual field intact OU. III, IV, VI - Extraocular movements intact. V - Facial sensation intact bilaterally. VII - Facial movement intact bilaterally. VIII - Hearing & vestibular intact bilaterally. X - Palate elevates symmetrically. XI - Chin turning & shoulder shrug intact bilaterally. XII - Tongue protrusion intact.  Motor Strength - The patient's strength was normal in all extremities and pronator drift was absent.  Bulk was normal and fasciculations were absent. 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 Left foot lateral sole round shaped wound with dressing.   Motor Tone - Muscle tone was assessed at the neck and appendages and was normal.  Reflexes - The patient's reflexes were symmetrical in all extremities and he had no pathological reflexes.  Sensory - Light touch, temperature/pinprick were assessed and were symmetrical.  Painful on palpation near left foot wound.  Coordination - The patient had normal movements in the hands and feet with no ataxia or dysmetria.  Tremor was absent.  Gait and Station - deferred.   ASSESSMENT/PLAN Mr. Havok Gellerman is a 68 y.o. male with history of diabetes, HTN, COPD, CAD with MI, OSA, hyperlipidemia and Lt foot wound presenting to [facility] with AMS, global aphasia, tremor, urinary incontinence and fever. He received IV t-PA at [facility] Sunday Oct 18th at 1935.  Likely GTC seizure - s/p tPA with suspected stroke like activity at OSH  Resultant  Back to pre-admission baseline  Code Stroke CT Head OSH - no acute finding   CTA H&N - OSH - left ICA 74% stenosis with calcified plaque - no LVO  MRI head - no acute abnormality. Atrophy. Old L cerebellar lacune. Distal R VA w/ slow flow/occlusion. B mastoid effusions.  Carotid Doppler - B ICA 1-39% stenosis, VAs antegrade   EEG - intermittent L temporal slowing, no sz   2D Echo - EF 55-60%. LA dilated. No source of embolus   Lacey Jensen Virus 2 - neg OSH?  LDL - 70  HgbA1c - 10.8  HIV nonreactive  UDS - positive opiates (has morphine as home meds)  VTE prophylaxis - SCDs  No antithrombotic prior to admission, now on ASA 81mg  and plavix 75mg  for his PVD. Will d/c plavix in anticipation of fem pop. Needs to be off x 5 days per [facility].  Keppra 1g load on admission and will continue keppra 500mg  bid  Therapy recommendations:  OP PT, OT at [facility]  Disposition:  Pending  As per [place] law, pt no driving unless  seizure free for 6 months and under physician's care. He needs follow up with neurology as outpt to driving clearance.   PVD  ABIs - mild RLE arterial dz, moderate LLE arterial dz.   Dr. Trula Slade on board  Angiogram - AAA. L superficial femoral artery occlusion and 90% L popliteal stenosis. Diseased R superficial femoral w/ multiple areas significant stenosis. Pt needs L femoral and profunda endarterectomy and L fem-pop bypass  On ASA and plavix and statin  Hold plavix x 5 days prior to procedure. Stopped 01/19/2019  So far procedure planned on 01/27/19  Transient high grade fever - left foot osteomyelitis   12/2018 MRI left foot showed early osteomyelitis  Follow up with outpt wound care but not on Abx yet as per pt  Presented with high grade fever 104->101.3->afebrile->afebrile  Blood culture NGTD for 2 days  On cefepime and  vanco  Left foot wound - wound care on board  On Vanc 10/17>>, cefepime 10/17>>10/20, rocephin 10/20>>  ID on board, recommend continue vanco, and narrowing cefepime to rocephin  Need PICC line on discharge for total Abx 6 weeks  No more fever, MRI neg for stroke, BCx NGTD, unlikely endocarditis, will not pursue TEE  Carotid stenosis  CTA showed left ICA 74% stenosis with bulky calcified plaque  CUS unremarkable bilaterally  VVS Dr. Trula Slade on board  No intervention recommended so far  Will need outpt follow up with [facility]   Hypertension  Home BP meds: lisinopril and spironolactone  Off cardene  Stable on the high end  Put on amlodipine 10 and resume lisinopril 40 . Long-term BP goal normotensive  Diabetes  Home diabetic meds: none  HgbA1c - 10.8, goal < 7.0  Hyperglycemia  SSI  CBG monitoring  On DM diet  On lantus  Need close PCP and endo follow up for better DM control  Other Stroke Risk Factors  Advanced age  Coronary artery disease  Obstructive sleep apnea  Other Active Problems  Mild Hyponatremia,  resolved - 130->132->138  Hospital day # 2   Rosalin Hawking, MD PhD Stroke Neurology 01/19/2019 2:28 PM   To contact Stroke Continuity provider, please refer to [email or web address] After hours, contact General Neurology

## 2019-01-19 NOTE — Progress Notes (Addendum)
LE vein mapping       has been completed. Preliminary results can be found under CV proc through chart review. Payten Beaumier, BS, RDMS, RVT   

## 2019-01-19 NOTE — Progress Notes (Signed)
Pharmacy Antibiotic Note  Danny Carpenter is a 68 y.o. male admitted on 01/16/2019 with osteomyelitis.  Pharmacy has been consulted for vancomycin and cefepime dosing.    Vancomycin trough elevated  Plan: Decrease Vancomycin to 1 gram iv Q 12 hours starting tomorrow at 6 am Cefepime 2g IV Q8H.  Height: 6\' 1"  (185.4 cm) Weight: 202 lb 9.6 oz (91.9 kg) IBW/kg (Calculated) : 79.9  Temp (24hrs), Avg:97.9 F (36.6 C), Min:97.5 F (36.4 C), Max:98.1 F (36.7 C)   No Known Allergies  Thank you for allowing pharmacy to be a part of this patient's care.  Anette Guarneri, PharmD 01/19/2019 2:17 PM

## 2019-01-19 NOTE — Progress Notes (Signed)
SLP Cancellation Note  Patient Details Name: Danny Carpenter MRN: [id number] DOB: 1950/09/15   Cancelled treatment:       Reason Eval/Treat Not Completed: Patient at procedure or test/unavailable. Unable to complete cog/com evaluation at this time, as pt is currently off unit in Cath Lab. Will continue efforts.  Eyvonne Burchfield B. Quentin Ore, [facility], [place] Speech Language Pathologist Office: [phone] Pager: [phone]  Shonna Chock 01/19/2019, 9:06 AM

## 2019-01-19 NOTE — Evaluation (Signed)
Speech Language Pathology Evaluation Patient Details Name: Danny Carpenter MRN: [id number] DOB: 1951/01/27 Today's Date: 01/19/2019 Time: [id number] SLP Time Calculation (min) (ACUTE ONLY): 20 min  Problem List:  Patient Active Problem List   Diagnosis Date Noted  . Subacute osteomyelitis of left foot ([place])   . PVD (peripheral vascular disease) ([place])   . CVA (cerebral vascular accident) ([place]) 01/17/2019  . Diabetic foot infection ([place]) 01/17/2019   Past Medical History:  Past Medical History:  Diagnosis Date  . COPD (chronic obstructive pulmonary disease) ([place])   . DM2 (diabetes mellitus, type 2) ([place])   . GERD (gastroesophageal reflux disease)   . HTN (hypertension)   . Hypercholesteremia   . Sleep apnea    Past Surgical History:  Past Surgical History:  Procedure Laterality Date  . ABDOMINAL AORTOGRAM W/LOWER EXTREMITY N/A 01/19/2019   Procedure: ABDOMINAL AORTOGRAM W/LOWER EXTREMITY;  Surgeon: Serafina Mitchell, MD;  Location: [facility];  Service: Cardiovascular;  Laterality: N/A;  . APPENDECTOMY    . FINGER AMPUTATION     Left second and third fingers, work accident  . KNEE SURGERY Left   . LOWER EXTREMITY ANGIOGRAM  05/2008   Left SFA atherectomy with diamondback 2.25 utilizing distal protection device NAV6 placed in the left popliteal artery. Angioplasty of the left SFA with ATV balloon 6 x 40   . SHOULDER SURGERY Right    HPI:   Danny Carpenter is a 68 y.o. male with a history of diabetes, htn, hyperlipidemia who was in his normal state of health until 5:30 pm.  At that time he had a rather abrupt change in mental status and was taken to [facility] ER. There he was found to be globally aphasic and was treated with IV TPA.  He was transferred to [facility] management. MRI showed no acute intracranial abnormality, moderate generalized atrophy, remote lacunar infarcts of L cerebellum, bilateral mastoid effusions. EEG showed This study is suggestive of non  specific cortical dysfunction in left temporal region. No seizures or epileptiform discharges were seen throughout the recording.   Assessment / Plan / Recommendation Clinical Impression  Pt presents with intelligible speech, and intact language skills. Documented aphasia present at admit has resolved. The Mini-Mental State Examination (MMSE) was also administered. Pt scored 30/30, indicating performance WFL. Pt has an 11th grade education, and reports that he and his wife have help from their children. Pt reports being at baseline for speech/language/and cognition. No further ST intervention recommended at this time. Please reconsult if needs arise.    SLP Assessment  SLP Recommendation/Assessment: Patient does not need any further Speech Language Pathology Services    Follow Up Recommendations  None       SLP Evaluation Cognition  Overall Cognitive Status: No family/caregiver present to determine baseline cognitive functioning Arousal/Alertness: Awake/alert Orientation Level: Oriented X4 Attention: Focused Focused Attention: Appears intact Memory: Appears intact Immediate Memory Recall: Sock;Blue;Bed Memory Recall Sock: Without Cue Memory Recall Blue: Without Cue Memory Recall Bed: Without Cue Awareness: Appears intact       Comprehension  Auditory Comprehension Overall Auditory Comprehension: Appears within functional limits for tasks assessed    Expression Expression Primary Mode of Expression: Verbal Verbal Expression Overall Verbal Expression: Appears within functional limits for tasks assessed   Oral / Motor  Oral Motor/Sensory Function Overall Oral Motor/Sensory Function: Within functional limits Motor Speech Overall Motor Speech: Appears within functional limits for tasks assessed   GO  Celia B. Quentin Ore, [facility], [place] Speech Language Pathologist Office: [phone] Pager: [phone]  Shonna Chock 01/19/2019, 4:26 PM

## 2019-01-19 NOTE — Progress Notes (Signed)
[facility] for Infectious Disease  Date of Admission:  01/16/2019     Total days of antibiotics 4         ASSESSMENT:  Danny Carpenter completed his aortogram showing 90% left popliteal stenosis and diffusely diseased right superficial femoral artery with multiple areas of hemodynamically significant stenosis and is being scheduled for popliteal bypass and endarterectomy. Blood cultures remain without growth to date. Plan remains unchanged and will continue broad spectrum antibiotics with vancomycin and narrow to ceftriaxone. Will need PICC line prior to discharge and can be placed at any time.  PLAN:  1. Continue vancomycin and narrow cefepime to ceftriaxone.  2. Wound care per primary team. 3. Vascular surgery scheduled procedure for 01/27/19. 4. PICC line when ready for discharge or sooner if needed.    Active Problems:   CVA (cerebral vascular accident) ([place])   Diabetic foot infection ([place])   Subacute osteomyelitis of left foot ([place])   PVD (peripheral vascular disease) ([place])     stroke: mapping our early stages of recovery book   Does not apply Once   amLODipine  10 mg Oral Daily   aspirin EC  81 mg Oral Daily   atorvastatin  40 mg Oral q1800   Chlorhexidine Gluconate Cloth  6 each Topical Daily   DULoxetine  60 mg Oral Daily   fenofibrate  160 mg Oral Daily   gabapentin  600 mg Oral BID   influenza vaccine adjuvanted  0.5 mL Intramuscular Tomorrow-1000   insulin aspart  0-15 Units Subcutaneous Q4H   insulin glargine  13 Units Subcutaneous Daily   levETIRAcetam  500 mg Oral BID   lisinopril  40 mg Oral Daily   morphine  15 mg Oral Q12H   pantoprazole  40 mg Oral Daily   potassium chloride  40 mEq Oral Q4H   sodium chloride flush  3 mL Intravenous Q12H   sodium chloride flush  3 mL Intravenous Q12H   tamsulosin  0.4 mg Oral QHS   traZODone  100 mg Oral QHS    SUBJECTIVE:  Afebrile overnight with no acute events. Angiogram completed and now  scheduled for popliteal bypass with left femoral and profunda endarterectomy. Feeling good today.   No Known Allergies   Review of Systems: Review of Systems  Constitutional: Negative for chills, fever and weight loss.  Respiratory: Negative for cough, shortness of breath and wheezing.   Cardiovascular: Negative for chest pain and leg swelling.  Gastrointestinal: Negative for abdominal pain, constipation, diarrhea, nausea and vomiting.  Skin: Negative for rash.      OBJECTIVE: Vitals:   01/19/19 0955 01/19/19 1007 01/19/19 1115 01/19/19 1154  BP: (!) 143/79 (!) 163/79 (!) 184/90 (!) 167/68  Pulse: (!) 0 69 66 64  Resp: (!) 0  (!) 8   Temp:      TempSrc:      SpO2: (!) 0%  100% 98%  Weight:      Height:       Body mass index is 26.73 kg/m.  Physical Exam Constitutional:      General: He is not in acute distress.    Appearance: He is well-developed.     Comments: Seated in the chair; pleasant.   Cardiovascular:     Rate and Rhythm: Normal rate and regular rhythm.     Heart sounds: Normal heart sounds.  Pulmonary:     Effort: Pulmonary effort is normal.     Breath sounds: Normal breath sounds.  Skin:  General: Skin is warm and dry.  Neurological:     Mental Status: He is alert and oriented to person, place, and time.  Psychiatric:        Behavior: Behavior normal.        Thought Content: Thought content normal.        Judgment: Judgment normal.     Lab Results Lab Results  Component Value Date   WBC 4.9 01/19/2019   HGB 11.7 (L) 01/19/2019   HCT 35.0 (L) 01/19/2019   MCV 88.6 01/19/2019   PLT 155 01/19/2019    Lab Results  Component Value Date   CREATININE 0.77 01/19/2019   BUN 11 01/19/2019   NA 138 01/19/2019   K 3.2 (L) 01/19/2019   CL 101 01/19/2019   CO2 25 01/19/2019   No results found for: ALT, AST, GGT, ALKPHOS, BILITOT   Microbiology: Recent Results (from the past 240 hour(s))  MRSA PCR Screening     Status: None   Collection Time:  01/17/19  2:25 AM   Specimen: Nasopharyngeal  Result Value Ref Range Status   MRSA by PCR NEGATIVE NEGATIVE Final    Comment:        The GeneXpert MRSA Assay (FDA approved for NASAL specimens only), is one component of a comprehensive MRSA colonization surveillance program. It is not intended to diagnose MRSA infection nor to guide or monitor treatment for MRSA infections. Performed at [facility], [place] [address] [place], [place] [address]   Blood culture (routine x 2)     Status: None (Preliminary result)   Collection Time: 01/17/19  2:34 AM   Specimen: BLOOD  Result Value Ref Range Status   Specimen Description BLOOD LEFT ANTECUBITAL  Final   Special Requests   Final    BOTTLES DRAWN AEROBIC ONLY Blood Culture adequate volume   Culture   Final    NO GROWTH 2 DAYS Performed at [facility], [place] [address] [place], [place] [address]    Report Status PENDING  Incomplete  Blood culture (routine x 2)     Status: None (Preliminary result)   Collection Time: 01/17/19  2:41 AM   Specimen: BLOOD  Result Value Ref Range Status   Specimen Description BLOOD LEFT ANTECUBITAL  Final   Special Requests   Final    BOTTLES DRAWN AEROBIC ONLY Blood Culture adequate volume   Culture   Final    NO GROWTH 2 DAYS Performed at [facility], [place] [address], [place], [place] [address]    Report Status PENDING  Incomplete  Culture, Urine     Status: None   Collection Time: 01/17/19  7:28 PM   Specimen: Urine, Clean Catch  Result Value Ref Range Status   Specimen Description URINE, CLEAN CATCH  Final   Special Requests NONE  Final   Culture   Final    NO GROWTH Performed at [facility], [place] [address], [place],  [address]    Report Status 01/18/2019 FINAL  Final     Terri Piedra, NP [place] [facility] [phone] Pager  01/19/2019  3:41 PM

## 2019-01-19 NOTE — Progress Notes (Signed)
Patient will need left femoral popliteal bypasss Have asked cardiology to evaluate for surgical clearance VEin map and carotid doppler studies ordered Will need to be off plavix for 5 days   Wells Brabham

## 2019-01-19 NOTE — Op Note (Signed)
    Patient name: Danny Carpenter MRN: [id number] DOB: [birth year] Sex: male  01/19/2019 Pre-operative Diagnosis: Left leg ulcer Post-operative diagnosis:  Same Surgeon:  [name] Procedure Performed:  1.  Ultrasound-guided access, right femoral artery  2.  Abdominal aortogram  3.  Bilateral lower extremity runoff  4.  Second-order catheterization  5.  Conscious sedation, 22 minutes  6.  Closure device, Mynx   Indications: Patient has a left foot ulcer in setting of vascular insufficiency.  He comes in today for further evaluation and possible invention.  Procedure:  The patient was identified in the holding area and taken to room 8.  The patient was then placed supine on the table and prepped and draped in the usual sterile fashion.  A time out was called.  Conscious sedation was administered with the use of IV fentanyl and Versed under continuous physician and nurse monitoring.  Heart rate, blood pressure, and oxygen saturation were continuously monitored.  Total sedation time was 66minutes.  Ultrasound was used to evaluate the right common femoral artery.  It was patent .  A digital ultrasound image was acquired.  A micropuncture needle was used to access the right common femoral artery under ultrasound guidance.  An 018 wire was advanced without resistance and a micropuncture sheath was placed.  The 018 wire was removed and a benson wire was placed.  The micropuncture sheath was exchanged for a 5 french sheath.  An omniflush catheter was advanced over the wire to the level of L-1.  An abdominal angiogram was obtained.  Next the catheter was pulled down to the aortic bifurcation and bilateral runoff was performed  Findings:   Aortogram: No evidence of renal artery stenosis.  Infrarenal abdominal aortic aneurysm is identified.  Bilateral common and external iliac arteries are small in caliber but patent without stenosis  Right Lower Extremity: Heavy calcification throughout the right lower  extremity.  The common femoral profundofemoral artery are patent throughout their course.  The superficial femoral artery is diffusely diseased throughout with many areas of high-grade stenosis.  There is two-vessel runoff via the posterior tibial and peroneal artery  Left Lower Extremity: The left common femoral artery is patent.  The profundofemoral artery is patent however there is a 90% stenosis near its origin.  The superficial femoral artery is flush occluded.  There is reconstitution of the above-knee popliteal artery with two-vessel runoff.  Intervention: None.  A minx device was used for closure  Impression:  #1  Abdominal aortic aneurysm.  Diameter cannot be determined from angiographic evaluation  #2  Left superficial femoral artery flush occlusion and 90% left popliteal stenosis with reconstitution of the popliteal artery and two-vessel runoff  #3  Diffusely diseased right superficial femoral artery with multiple areas of hemodynamically significant stenosis  #4  Patient will be scheduled for a left femoral and profunda endarterectomy and left femoral-popliteal bypass graft.   Theotis Burrow, M.D., [facility] [place] Office: [phone] Pager:  [phone]

## 2019-01-19 NOTE — Progress Notes (Signed)
SLP Cancellation Note  Patient Details Name: Danny Carpenter MRN: [id number] DOB: 1950/11/07   Cancelled treatment:       Reason Eval/Treat Not Completed: Patient at procedure or test/unavailable. Pt now on 4E. He currently has multiple team members in his room, and is therefore not available for evaluation. Chart review indicates resolution of global aphasia. Will continue efforts.  Celia B. Quentin Ore, [facility], [place] Speech Language Pathologist Office: [phone] Pager: [phone]  Shonna Chock 01/19/2019, 2:38 PM

## 2019-01-20 ENCOUNTER — Inpatient Hospital Stay: Payer: Self-pay

## 2019-01-20 DIAGNOSIS — E1169 Type 2 diabetes mellitus with other specified complication: Secondary | ICD-10-CM

## 2019-01-20 DIAGNOSIS — E11621 Type 2 diabetes mellitus with foot ulcer: Secondary | ICD-10-CM

## 2019-01-20 DIAGNOSIS — I6529 Occlusion and stenosis of unspecified carotid artery: Secondary | ICD-10-CM

## 2019-01-20 DIAGNOSIS — M869 Osteomyelitis, unspecified: Secondary | ICD-10-CM

## 2019-01-20 DIAGNOSIS — L97529 Non-pressure chronic ulcer of other part of left foot with unspecified severity: Secondary | ICD-10-CM

## 2019-01-20 DIAGNOSIS — G40409 Other generalized epilepsy and epileptic syndromes, not intractable, without status epilepticus: Secondary | ICD-10-CM

## 2019-01-20 DIAGNOSIS — L97509 Non-pressure chronic ulcer of other part of unspecified foot with unspecified severity: Secondary | ICD-10-CM

## 2019-01-20 DIAGNOSIS — Z95828 Presence of other vascular implants and grafts: Secondary | ICD-10-CM

## 2019-01-20 LAB — BASIC METABOLIC PANEL
Anion gap: 9 (ref 5–15)
BUN: 10 mg/dL (ref 8–23)
CO2: 26 mmol/L (ref 22–32)
Calcium: 9.1 mg/dL (ref 8.9–10.3)
Chloride: 101 mmol/L (ref 98–111)
Creatinine, Ser: 0.94 mg/dL (ref 0.61–1.24)
GFR calc Af Amer: >60 mL/min (ref >60)
GFR calc non Af Amer: >60 mL/min (ref >60)
Glucose, Bld: 172 mg/dL — ABNORMAL HIGH (ref 70–99)
Potassium: 3.8 mmol/L (ref 3.5–5.1)
Sodium: 136 mmol/L (ref 135–145)

## 2019-01-20 LAB — CBC
HCT: 32.3 % — ABNORMAL LOW (ref 39.0–52.0)
Hemoglobin: 10.6 g/dL — ABNORMAL LOW (ref 13.0–17.0)
MCH: 29.2 pg (ref 26.0–34.0)
MCHC: 32.8 g/dL (ref 30.0–36.0)
MCV: 89 fL (ref 80.0–100.0)
Platelets: 149 10*3/uL — ABNORMAL LOW (ref 150–400)
RBC: 3.63 MIL/uL — ABNORMAL LOW (ref 4.22–5.81)
RDW: 13.6 % (ref 11.5–15.5)
WBC: 5.3 10*3/uL (ref 4.0–10.5)
nRBC: 0 % (ref 0.0–0.2)

## 2019-01-20 LAB — GLUCOSE, CAPILLARY
Glucose-Capillary: 200 mg/dL — ABNORMAL HIGH (ref 70–99)
Glucose-Capillary: 356 mg/dL — ABNORMAL HIGH (ref 70–99)
Glucose-Capillary: 468 mg/dL — ABNORMAL HIGH (ref 70–99)
Glucose-Capillary: 158 mg/dL — ABNORMAL HIGH (ref 70–99)

## 2019-01-20 MED ORDER — CEFTRIAXONE IV (FOR PTA / DISCHARGE USE ONLY): 2.0000 g | INTRAVENOUS | 0 refills | Status: AC

## 2019-01-20 MED ORDER — SODIUM CHLORIDE 0.9% FLUSH: 10.0000 mL | INTRAVENOUS | Status: DC | PRN

## 2019-01-20 MED ORDER — SODIUM CHLORIDE 0.9% FLUSH
10.0000 mL | Freq: Two times a day (BID) | INTRAVENOUS | Status: DC
  Administered 2019-01-20: 10 mL

## 2019-01-20 MED ORDER — LEVETIRACETAM 500 MG PO TABS: 500.0000 mg | ORAL_TABLET | Freq: Two times a day (BID) | ORAL | 0 refills | Status: DC

## 2019-01-20 MED ORDER — ATORVASTATIN CALCIUM 40 MG PO TABS: 40.0000 mg | ORAL_TABLET | Freq: Every day | ORAL | 0 refills | Status: DC

## 2019-01-20 MED ORDER — AMLODIPINE BESYLATE 10 MG PO TABS: 10.0000 mg | ORAL_TABLET | Freq: Every day | ORAL | 0 refills | Status: AC

## 2019-01-20 MED ORDER — VANCOMYCIN IV (FOR PTA / DISCHARGE USE ONLY): 1000.0000 mg | Freq: Two times a day (BID) | INTRAVENOUS | 0 refills | Status: AC

## 2019-01-20 MED ORDER — ASPIRIN 81 MG PO TBEC: 81.0000 mg | DELAYED_RELEASE_TABLET | Freq: Every day | ORAL | 0 refills | Status: DC

## 2019-01-20 NOTE — Progress Notes (Signed)
STROKE TEAM PROGRESS NOTE   INTERVAL HISTORY Pt sitting in chair for breakfast. No acute event overnight. VVS plan for left fem-pop bypass soon. We discussed about possible seizure which led to current admission and he can not drive until seizure free for 6 months. He said that his son doing the most his driving for him and now he will ask his son to do all his driving then.   OBJECTIVE Vitals:   01/19/19 1154 01/19/19 1639 01/19/19 2126 01/20/19 0434  BP: (!) 167/68 (!) 159/73 (!) 166/80 (!) 156/73  Pulse: 64 83 76 77  Resp:  20 19 20   Temp:  98.3 F (36.8 C) 98 F (36.7 C) 98.5 F (36.9 C)  TempSrc:  Oral Oral Oral  SpO2: 98% 97% 98% 99%  Weight:      Height:        CBC:  Recent Labs  Lab 01/17/19 0241  01/19/19 0604 01/20/19 0333  WBC 6.8   < > 4.9 5.3  NEUTROABS 5.1  --   --   --   HGB 12.5*   < > 11.7* 10.6*  HCT 37.3*   < > 35.0* 32.3*  MCV 89.0   < > 88.6 89.0  PLT 149*   < > 155 149*   < > = values in this interval not displayed.    Basic Metabolic Panel:  Recent Labs  Lab 01/19/19 0604 01/20/19 0333  NA 138 136  K 3.2* 3.8  CL 101 101  CO2 25 26  GLUCOSE 96 172*  BUN 11 10  CREATININE 0.77 0.94  CALCIUM 8.9 9.1    Lipid Panel:     Component Value Date/Time   CHOL 131 01/17/2019 0241   TRIG 130 01/17/2019 0241   HDL 35 (L) 01/17/2019 0241   CHOLHDL 3.7 01/17/2019 0241   VLDL 26 01/17/2019 0241   LDLCALC 70 01/17/2019 0241   HgbA1c:  Lab Results  Component Value Date   HGBA1C 13.5 (H) 01/18/2019   Urine Drug Screen:     Component Value Date/Time   LABOPIA POSITIVE (A) 01/17/2019 1928   COCAINSCRNUR NONE DETECTED 01/17/2019 1928   LABBENZ NONE DETECTED 01/17/2019 1928   AMPHETMU NONE DETECTED 01/17/2019 1928   THCU NONE DETECTED 01/17/2019 1928   LABBARB NONE DETECTED 01/17/2019 1928    Alcohol Level No results found for: ETH  IMAGING past 24h Vas [place] Lower Extremity Saphenous Vein Mapping  Result Date: 01/19/2019 LOWER EXTREMITY  VEIN MAPPING Indications: Pre-op  Performing Technologist: June Leap RDMS, RVT  Examination Guidelines: A complete evaluation includes B-mode imaging, spectral Doppler, color Doppler, and power Doppler as needed of all accessible portions of each vessel. Bilateral testing is considered an integral part of a complete examination. Limited examinations for reoccurring indications may be performed as noted. +--------------+-----------------+-------------------+-------------+-----------+  RT Diameter     RT Findings           GSV         LT Diameter LT Findings      (cm)                                             (cm)                 +--------------+-----------------+-------------------+-------------+-----------+      0.43  Saphenofemoral       0.59                                                     Junction                               +--------------+-----------------+-------------------+-------------+-----------+      0.46                        Proximal thigh       0.37                 +--------------+-----------------+-------------------+-------------+-----------+      0.50         branching         Mid thigh         0.38                 +--------------+-----------------+-------------------+-------------+-----------+      0.48                         Distal thigh        0.36      branching  +--------------+-----------------+-------------------+-------------+-----------+      0.38                             Knee            0.43                 +--------------+-----------------+-------------------+-------------+-----------+      0.38         branches/         Prox calf         0.44                                 varicosities                                               +--------------+-----------------+-------------------+-------------+-----------+      0.40                           Mid calf          0.46      branching   +--------------+-----------------+-------------------+-------------+-----------+      0.27     narrowing mid to     Distal calf        0.37      branching                     distal                                                  +--------------+-----------------+-------------------+-------------+-----------+    Preliminary    [place] Ekg Site Rite  Result Date: 01/20/2019 If Site [employer]  not attached, placement could not be confirmed due to current cardiac rhythm.    Dg Chest Port 1 View 01/17/2019 Improved lung volumes and aeration from Radiograph yesterday with residual atelectasis or scarring at the right lung base. Aortic Atherosclerosis (ICD10-I70.0).   Dg Foot 2 Views Left 01/17/2019 1. Subtle decreased density the fifth metatarsal head in the region of question osteomyelitis on MRI last month, suspicious for osteomyelitis.  2. Cortical irregularity of the fifth toe proximal phalanx may represent fracture or osteomyelitis.  3. Site of soft tissue ulcer not well demonstrated on current exam.   MRI left foot 12/18/2018 IMPRESSION: 1. Area of ulceration over the lateral plantar base of the fifth metatarsal head with a tract to the underlying cortical surface. There is findings which could be suggestive of early osteomyelitis of the fifth metatarsal head underlying the area of ulceration. No abscess 2. Probable reactive marrow involving the fifth proximal phalanx.  CT Head OSH Stable and normal for age non contrast CT appearance of the brain. No acute traumatic injury identified. ASPECTS 10.  CTA Head and neck OSH IMPRESSION: 1. Negative for large vessel occlusion, but positive for: - near occlusion of the right vertebral artery V4 segment due to high-grade stenosis from calcified plaque. - high-grade Left ICA origin stenosis due to bulky calcified plaque, numerically estimated at 74%. - moderate distal left vertebral artery stenosis. - up to moderate right ICA  siphon stenosis at the anterior genu. 2. Widespread atherosclerosis elsewhere in the head and neck but no other hemodynamically significant stenosis. 3. Aortic Atherosclerosis (ICD10-I70.0).  Mr Danny Carpenter Contrast 01/17/2019 1. No acute intracranial abnormality. 2. Moderate generalized atrophy. 3. Remote lacunar infarcts of the left cerebellum. 4. Abnormal signal in the distal right vertebral artery, V4 segment, likely related to slow flow or occlusion. 5. Bilateral mastoid effusions.   Vas [place] Burnard Bunting With/wo Tbi 01/17/2019 Right: Resting right ankle-brachial index indicates mild right lower extremity arterial disease.  Left: Resting left ankle-brachial index indicates moderate left lower extremity arterial disease. Unable to obtain TBI due to patient movement, and patient somnolence.  Bilateral Carotid Dopplers  01/17/2019 Right Carotid: Velocities in the right ICA are consistent with a 1-39% stenosis.  Left Carotid: Velocities in the left ICA are consistent with a 1-39% stenosis.  Vertebrals:  Bilateral vertebral arteries demonstrate antegrade flow. Subclavians: Normal flow hemodynamics were seen in bilateral subclavian arteries.   Arteriorgram Trula Slade) 01/19/2019 #1  Abdominal aortic aneurysm.  Diameter cannot be determined from angiographic evaluation #2  Left superficial femoral artery flush occlusion and 90% left popliteal stenosis with reconstitution of the popliteal artery and two-vessel runoff #3  Diffusely diseased right superficial femoral artery with multiple areas of hemodynamically significant stenosis #4  Patient will be scheduled for a left femoral and profunda endarterectomy and left femoral-popliteal bypass graft.  Transthoracic Echocardiogram   1. Left ventricular ejection fraction, by visual estimation, is 55 to 60%. The left ventricle has normal function. Normal left ventricular size. There is no left ventricular hypertrophy.  2. Frequent PVCs. No source of cardiac  embolism detected.  3. Definity contrast agent was given IV to delineate the left ventricular endocardial borders.  4. Left ventricular diastolic Doppler parameters are consistent with impaired relaxation pattern of LV diastolic filling.  5. No LV thrombus seen contrast imaging.  6. Global right ventricle has normal systolic function.The right ventricular size is normal. No increase in right ventricular wall thickness.  7. Left atrial size was mildly dilated.  8. Right  atrial size was normal.  9. Mild aortic valve annular calcification. 10. The mitral valve is degenerative. Trace mitral valve regurgitation. 11. The tricuspid valve is grossly normal. Tricuspid valve regurgitation is trivial. 12. The aortic valve is tricuspid Aortic valve regurgitation was not visualized by color flow Doppler. Structurally normal aortic valve, with no evidence of sclerosis or stenosis. 13. There is Mild thickening of the aortic valve. 14. The pulmonic valve was grossly normal. Pulmonic valve regurgitation is trivial by color flow Doppler. 15. Mild plaque invoving the ascending aorta. 16. TR signal is inadequate for assessing pulmonary artery systolic pressure. 17. The inferior vena cava is normal in size with greater than 50% respiratory variability, suggesting right atrial pressure of 3 mmHg.  ECG - SR rate 89 BPM. (See cardiology reading for complete details)  EEG Abnormality - Intermittent slow, left temporal This study is suggestive of non specific cortical dysfunction in left temporal region. No seizures or epileptiform discharges were seen throughout the recording.   PHYSICAL EXAM  Temp:  [98 F (36.7 C)-98.5 F (36.9 C)] 98.5 F (36.9 C) (10/21 0434) Pulse Rate:  [64-83] 77 (10/21 0434) Resp:  [19-20] 20 (10/21 0434) BP: (156-167)/(68-80) 156/73 (10/21 0434) SpO2:  [97 %-99 %] 99 % (10/21 0434)  General - Well nourished, well developed, in no apparent distress.  Ophthalmologic - fundi not  visualized due to noncooperation.  Cardiovascular - Regular rhythm and rate.  Mental Status -  Level of arousal and orientation to time, place, and person were intact. Language including expression, naming, repetition, comprehension was assessed and found intact. Fund of Knowledge was assessed and was intact.  Cranial Nerves II - XII - II - Visual field intact OU. III, IV, VI - Extraocular movements intact. V - Facial sensation intact bilaterally. VII - Facial movement intact bilaterally. VIII - Hearing & vestibular intact bilaterally. X - Palate elevates symmetrically. XI - Chin turning & shoulder shrug intact bilaterally. XII - Tongue protrusion intact.  Motor Strength - The patient's strength was normal in all extremities and pronator drift was absent.  Bulk was normal and fasciculations were absent. 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 Left foot lateral plantar round shaped wound with dressing.   Motor Tone - Muscle tone was assessed at the neck and appendages and was normal.  Reflexes - The patient's reflexes were symmetrical in all extremities and he had no pathological reflexes.  Sensory - Light touch, temperature/pinprick were assessed and were symmetrical.  Coordination - The patient had normal movements in the hands and feet with no ataxia or dysmetria.  Tremor was absent.  Gait and Station - deferred.   ASSESSMENT/PLAN Danny Carpenter is a 68 y.o. male with history of diabetes, HTN, COPD, CAD with MI, OSA, hyperlipidemia and Lt foot wound presenting to [facility] with AMS, global aphasia, tremor, urinary incontinence and fever. He received IV t-PA at [facility] Sunday Oct 18th at 1935.  Likely GTC seizure - s/p tPA with suspected stroke like activity at OSH  Resultant  Back to pre-admission baseline  Code Stroke CT Head OSH - no acute finding   CTA H&N - OSH - left ICA 74% stenosis with calcified plaque - no  LVO  MRI head - no acute abnormality. Atrophy. Old L cerebellar lacune. Distal R VA w/ slow flow/occlusion. B mastoid effusions.  Carotid Doppler - B ICA 1-39% stenosis, VAs antegrade   EEG - intermittent L temporal slowing, no sz  2D Echo - EF 55-60%. LA dilated. No source of embolus   Lacey Jensen Virus 2 - neg OSH?  LDL - 70  HgbA1c - 10.8  HIV nonreactive  UDS - positive opiates (has morphine as home meds)  VTE prophylaxis - SCDs  No antithrombotic prior to admission, now on ASA 81mg . Plavix on hold in anticipation of fem pop bypass.  Keppra 1g load on admission and will continue keppra 500mg  bid on discharge  Therapy recommendations:  OP PT, OT at [facility]  Disposition:  Pending  As per Danny Carpenter law, pt no driving unless seizure free for 6 months and under physician's care. Discussed with pt and his son will do all his driving now. He needs follow up with neurology as outpt for driving clearance.   PVD  ABIs - mild RLE arterial dz, moderate LLE arterial dz.   Dr. Trula Slade on board  Angiogram - AAA. L superficial femoral artery occlusion and 90% L popliteal stenosis. Diseased R superficial femoral w/ multiple areas significant stenosis. Pt needs L femoral and profunda endarterectomy and L fem-pop bypass  On ASA and plavix and statin  Plavix stopped 01/19/2019  VVS Dr. Trula Slade to schedule for procedure, likely on 01/27/19  Transient high grade fever - left foot osteomyelitis   12/2018 MRI left foot showed early osteomyelitis  Follow up with outpt wound care but not on Abx yet as per pt  Presented with high grade fever 104->101.3->afebrile->afebrile  Blood culture NGTD for 2 days  On cefepime and vanco  Left foot wound - wound care on board  On Vanc 10/17>>, cefepime 10/17>>10/20, rocephin 10/20>>  ID on board, recommend continue vanco, and narrowing cefepime to rocephin  Need PICC line on discharge for total Abx 6 weeks  No more fever, MRI neg for stroke, BCx  NGTD, unlikely endocarditis, will not pursue TEE  Carotid stenosis  CTA showed left ICA 74% stenosis with bulky calcified plaque  CUS unremarkable bilaterally  VVS Dr. Trula Slade on board  No intervention recommended so far  Will need outpt follow up with [facility]   Hypertension  Home BP meds: lisinopril and spironolactone  Off cardene  Stable on the high end  Put on amlodipine 10 and resume lisinopril 40 . Long-term BP goal normotensive  Diabetes  Home diabetic meds: none  HgbA1c - 10.8, goal < 7.0  Hyperglycemia  SSI  CBG monitoring  On DM diet  On lantus  Need close PCP and endo follow up for better DM control  Other Stroke Risk Factors  Advanced age  Coronary artery disease  Obstructive sleep apnea  Other Active Problems  Mild Hyponatremia, resolved - 130->132->138->136  Hospital day # 3   Neurology will sign off. Please call with questions. Pt will follow up with [facility] in about 4-6 weeks. Thanks for the consult.   Rosalin Hawking, MD PhD Stroke Neurology 01/20/2019 11:18 AM   To contact Stroke Continuity provider, please refer to [email or web address] After hours, contact General Neurology

## 2019-01-20 NOTE — TOC Initial Note (Addendum)
Transition of Care (TOC) - Initial/Assessment Note  Marvetta Gibbons RN, BSN Transitions of Care Unit 4E- RN Case Manager [phone]   Patient Details  Name: Elra Bolan MRN: [id number] Date of Birth: [birth year]  Transition of Care [facility]) CM/SW Contact:    Dawayne Patricia, RN Phone Number: 01/20/2019, 4:52 PM  Clinical Narrative:                 Pt admitted from home, per notes will need home IV abx x6weeks- pt is active with [facility]-([phone]) pt's CSW is Jinny Blossom-- her # is [phone] (fax- [phone])- per TC with Megan - [facility] will plan to arrange to take care of IV abx at the center and teach family to do in the home- They will work with their pharmacy to provide medications- and continue to follow pt for wound care needs- pt has been seen at the wound center in [place]. PICC line has been placed - and am waiting to see if [facility] program can have arrangements in place for [facility] in AM- so that pt can still go home this evening. Have faxed needed paperwork to [facility] for IV abx needs.   Expected Discharge Plan: [place] Barriers to Discharge: No Barriers Identified   Patient Goals and CMS Choice Patient states their goals for this hospitalization and ongoing recovery are:: to get home CMS Medicare.gov Compare Post Acute Care list provided to:: Patient Choice offered to / list presented to : Patient  Expected Discharge Plan and Services Expected Discharge Plan: [place]   Discharge Planning Services: CM Consult   Living arrangements for the past 2 months: Mobile Home                 DME Arranged: N/A DME Agency: NA       HH Arranged: RN, IV Antibiotics HH Agency: Other - See comment([facility] program- [place]) Date HH Agency Contacted: 01/20/19 Time HH Agency Contacted: 1440 Representative spoke with at [place]: Jinny Blossom  Prior Living Arrangements/Services Living arrangements for the past  2 months: Mobile Home Lives with:: Spouse Patient language and need for interpreter reviewed:: Yes Do you feel safe going back to the place where you live?: Yes      Need for Family Participation in Patient Care: Yes (Comment) Care giver support system in place?: Yes (comment) Current home services: DME, Other (comment)([facility] program) Criminal Activity/Legal Involvement Pertinent to Current Situation/Hospitalization: No - Comment as needed  Activities of Daily Living      Permission Sought/Granted Permission sought to share information with : Investment banker, corporate granted to share info w AGENCY: [facility]        Emotional Assessment Appearance:: Appears stated age Attitude/Demeanor/Rapport: Engaged Affect (typically observed): Appropriate Orientation: : Oriented to Self, Oriented to Place, Oriented to  Time, Oriented to Situation   Psych Involvement: No (comment)  Admission diagnosis:  Cough [R05] Osteomyelitis (HCC) [M86.9] Foot ulcer ([place]) [L97.509] Sepsis, due to unspecified organism, unspecified whether acute organ dysfunction present [facility]) [A41.9] Patient Active Problem List   Diagnosis Date Noted  . Subacute osteomyelitis of left foot ([place])   . PVD (peripheral vascular disease) ([place])   . CVA (cerebral vascular accident) ([place]) 01/17/2019  . Diabetic foot infection ([place]) 01/17/2019   PCP:  Melony Overly, MD Pharmacy:  No Pharmacies Listed    Social Determinants of Health (SDOH) Interventions    Readmission Risk Interventions  No flowsheet data found.

## 2019-01-20 NOTE — Progress Notes (Signed)
PHARMACY CONSULT NOTE FOR:  OUTPATIENT  PARENTERAL ANTIBIOTIC THERAPY (OPAT)  Indication: osteomyelitis  Regimen: Vancomycin 1000 mg IV q12h and ceftriaxone 2g IV q24h End date: 02/28/2019  IV antibiotic discharge orders are pended. To discharging provider:  please sign these orders via discharge navigator,  Select New Orders & click on the button choice - Manage This Unsigned Work.     Thank you for allowing pharmacy to be a part of this patient's care.  Phillis Haggis 01/20/2019, 12:24 PM

## 2019-01-20 NOTE — Progress Notes (Addendum)
  Progress Note    01/20/2019 7:59 AM 1 Day Post-Op  Subjective:  R groin minimal discomfort   Vitals:   01/19/19 2126 01/20/19 0434  BP: (!) 166/80 (!) 156/73  Pulse: 76 77  Resp: 19 20  Temp: 98 F (36.7 C) 98.5 F (36.9 C)  SpO2: 98% 99%   Physical Exam: Lungs:  Non labored Incisions:  R groin cath site without palpable hematoma Extremities:  L toe ulcer Neurologic: A&O  CBC    Component Value Date/Time   WBC 5.3 01/20/2019 0333   RBC 3.63 (L) 01/20/2019 0333   HGB 10.6 (L) 01/20/2019 0333   HCT 32.3 (L) 01/20/2019 0333   PLT 149 (L) 01/20/2019 0333   MCV 89.0 01/20/2019 0333   MCH 29.2 01/20/2019 0333   MCHC 32.8 01/20/2019 0333   RDW 13.6 01/20/2019 0333   LYMPHSABS 1.1 01/17/2019 0241   MONOABS 0.6 01/17/2019 0241   EOSABS 0.0 01/17/2019 0241   BASOSABS 0.0 01/17/2019 0241    BMET    Component Value Date/Time   NA 136 01/20/2019 0333   K 3.8 01/20/2019 0333   CL 101 01/20/2019 0333   CO2 26 01/20/2019 0333   GLUCOSE 172 (H) 01/20/2019 0333   BUN 10 01/20/2019 0333   CREATININE 0.94 01/20/2019 0333   CALCIUM 9.1 01/20/2019 0333   GFRNONAA >60 01/20/2019 0333   GFRAA >60 01/20/2019 0333    INR No results found for: INR   Intake/Output Summary (Last 24 hours) at 01/20/2019 0759 Last data filed at 01/19/2019 2130 Gross per 24 hour  Intake 360 ml  Output 900 ml  Net -540 ml     Assessment/Plan:  68 y.o. male is s/p arteriogram with CTO L SFA 1 Day Post-Op   Patient will require L femoral endarterectomy with femoral to popliteal bypass L GSV appears adequate for conduit use Ok to proceed with surgery without further testing per cardiology Continue IV abx per ID Dr. Trula Slade will notify patient of surgery date   Iline Oven [facility] [phone] 01/20/2019 7:59 AM   I agree with the above.  I have seen and evaluated the patient.  I have reviewed his vein map.  His saphenous vein appears adequate.  I  have placed him on the OR schedule for a left femoral-popliteal bypass graft next Wednesday.  From my perspective he can be discharged and return for his operation.  Danny Carpenter

## 2019-01-20 NOTE — Progress Notes (Signed)
Danny Carpenter  Date of Admission:  01/16/2019     Total days of antibiotics 5         ASSESSMENT:  Danny Carpenter is POD 1 from aortogram and doine well. He has beenscheduled for popliteal bypass and endarterectomy on 10/28 and primary team is working on discharge planning. Blood cultures from 10/18 have remained without growth to date and plan is for treatment of culture negative osteomyelitis of the left foot. PICC line placed. Discussed plan with Home Health and will arrange for vancomycin and ceftriaxone for 6 weeks with end date planned for 02/28/19. Will plan for follow up in ID office.   PLAN:  1. Continue vancomycin and ceftriaxone through 11/29.  2. Plan for follow up in ID office in 3-4 weeks.  Diagnosis: Culture negative osteomyelitis of the left foot.   Culture Result: Culture negative  No Known Allergies  OPAT Orders Discharge antibiotics: vancomycin and ceftriaxone Per pharmacy protocol  Aim for Vancomycin trough 15-20 or AUC 400-550 (unless otherwise indicated) Duration: 6 weeks  End Date: 02/28/19  [facility] Care Per Protocol:  Labs weekly while on IV antibiotics: _X_ CBC with differential _X_ BMP BIWEEKLY __ CMP _X_ CRP _X_ ESR _X_ Vancomycin trough __ CK  __ Please pull PIC at completion of IV antibiotics _X_ Please leave PIC in place until doctor has seen patient or been notified  Fax weekly labs to [phone]  Clinic Follow Up Appt:  02/22/19 at 2:30 with Danny Piedra, NP  Active Problems:   CVA (cerebral vascular accident) [facility])   Diabetic foot infection ([place])   Subacute osteomyelitis of left foot ([place])   PVD (peripheral vascular Carpenter) ([place])   .  stroke: mapping our early stages of recovery book   Does not apply Once  . amLODipine  10 mg Oral Daily  . aspirin EC  81 mg Oral Daily  . atorvastatin  40 mg Oral q1800  . Chlorhexidine Gluconate Cloth  6 each Topical Daily  . DULoxetine  60 mg Oral Daily  .  fenofibrate  160 mg Oral Daily  . gabapentin  600 mg Oral BID  . influenza vaccine adjuvanted  0.5 mL Intramuscular Tomorrow-1000  . insulin aspart  0-15 Units Subcutaneous Q4H  . insulin glargine  13 Units Subcutaneous Daily  . levETIRAcetam  500 mg Oral BID  . lisinopril  40 mg Oral Daily  . morphine  15 mg Oral Q12H  . pantoprazole  40 mg Oral Daily  . sodium chloride flush  3 mL Intravenous Q12H  . sodium chloride flush  3 mL Intravenous Q12H  . tamsulosin  0.4 mg Oral QHS  . traZODone  100 mg Oral QHS    SUBJECTIVE:  Afebrile overnight with no acute events or concerns. PICC line placed and getting ready to go home. Feeling good today.   No Known Allergies   Review of Systems: Review of Systems  Constitutional: Negative for chills, fever and weight loss.  Respiratory: Negative for cough, shortness of breath and wheezing.   Cardiovascular: Negative for chest pain and leg swelling.  Gastrointestinal: Negative for abdominal pain, constipation, diarrhea, nausea and vomiting.  Skin: Negative for rash.      OBJECTIVE: Vitals:   01/19/19 1639 01/19/19 2126 01/20/19 0434 01/20/19 1227  BP: (!) 159/73 (!) 166/80 (!) 156/73 (!) 132/47  Pulse: 83 76 77 89  Resp: 20 19 20 18   Temp: 98.3 F (36.8 C) 98 F (36.7 C) 98.5 F (  36.9 C) 99 F (37.2 C)  TempSrc: Oral Oral Oral Oral  SpO2: 97% 98% 99% 96%  Weight:      Height:       Body mass index is 26.73 kg/m.  Physical Exam Constitutional:      General: He is not in acute distress.    Appearance: He is well-developed.  Cardiovascular:     Rate and Rhythm: Normal rate and regular rhythm.     Heart sounds: Normal heart sounds.     Comments: PICC line in right upper extremity with dressing that is clean and dry and without evidence of infection.  Pulmonary:     Effort: Pulmonary effort is normal.     Breath sounds: Normal breath sounds.  Skin:    General: Skin is warm and dry.     Comments: Dressing on left foot is  clean and dry  Neurological:     Mental Status: He is alert and oriented to person, place, and time.  Psychiatric:        Behavior: Behavior normal.        Thought Content: Thought content normal.        Judgment: Judgment normal.     Lab Results Lab Results  Component Value Date   WBC 5.3 01/20/2019   HGB 10.6 (L) 01/20/2019   HCT 32.3 (L) 01/20/2019   MCV 89.0 01/20/2019   PLT 149 (L) 01/20/2019    Lab Results  Component Value Date   CREATININE 0.94 01/20/2019   BUN 10 01/20/2019   NA 136 01/20/2019   K 3.8 01/20/2019   CL 101 01/20/2019   CO2 26 01/20/2019   No results found for: ALT, AST, GGT, ALKPHOS, BILITOT   Microbiology: Recent Results (from the past 240 hour(s))  MRSA PCR Screening     Status: None   Collection Time: 01/17/19  2:25 AM   Specimen: Nasopharyngeal  Result Value Ref Range Status   MRSA by PCR NEGATIVE NEGATIVE Final    Comment:        The GeneXpert MRSA Assay (FDA approved for NASAL specimens only), is one component of a comprehensive MRSA colonization surveillance program. It is not intended to diagnose MRSA infection nor to guide or monitor treatment for MRSA infections. Performed at [facility], [place] [address] [place], [place] [address]   Blood culture (routine x 2)     Status: None (Preliminary result)   Collection Time: 01/17/19  2:34 AM   Specimen: BLOOD  Result Value Ref Range Status   Specimen Description BLOOD LEFT ANTECUBITAL  Final   Special Requests   Final    BOTTLES DRAWN AEROBIC ONLY Blood Culture adequate volume   Culture   Final    NO GROWTH 3 DAYS Performed at [facility], [place] [address], [place],  [address]    Report Status PENDING  Incomplete  Blood culture (routine x 2)     Status: None (Preliminary result)   Collection Time: 01/17/19  2:41 AM   Specimen: BLOOD  Result Value Ref Range Status   Specimen Description BLOOD LEFT ANTECUBITAL  Final   Special Requests   Final    BOTTLES DRAWN  AEROBIC ONLY Blood Culture adequate volume   Culture   Final    NO GROWTH 3 DAYS Performed at [facility], [place] [address] [place],  [address]    Report Status PENDING  Incomplete  Culture, Urine     Status: None  Collection Time: 01/17/19  7:28 PM   Specimen: Urine, Clean Catch  Result Value Ref Range Status   Specimen Description URINE, CLEAN CATCH  Final   Special Requests NONE  Final   Culture   Final    NO GROWTH Performed at [facility], [place] [address] [place], [place] [address]    Report Status 01/18/2019 FINAL  Final     Danny Piedra, NP [facility] [phone] Pager  01/20/2019  12:51 PM

## 2019-01-20 NOTE — H&P (View-Only) (Signed)
  Progress Note    01/20/2019 7:59 AM 1 Day Post-Op  Subjective:  R groin minimal discomfort   Vitals:   01/19/19 2126 01/20/19 0434  BP: (!) 166/80 (!) 156/73  Pulse: 76 77  Resp: 19 20  Temp: 98 F (36.7 C) 98.5 F (36.9 C)  SpO2: 98% 99%   Physical Exam: Lungs:  Non labored Incisions:  R groin cath site without palpable hematoma Extremities:  L toe ulcer Neurologic: A&O  CBC    Component Value Date/Time   WBC 5.3 01/20/2019 0333   RBC 3.63 (L) 01/20/2019 0333   HGB 10.6 (L) 01/20/2019 0333   HCT 32.3 (L) 01/20/2019 0333   PLT 149 (L) 01/20/2019 0333   MCV 89.0 01/20/2019 0333   MCH 29.2 01/20/2019 0333   MCHC 32.8 01/20/2019 0333   RDW 13.6 01/20/2019 0333   LYMPHSABS 1.1 01/17/2019 0241   MONOABS 0.6 01/17/2019 0241   EOSABS 0.0 01/17/2019 0241   BASOSABS 0.0 01/17/2019 0241    BMET    Component Value Date/Time   NA 136 01/20/2019 0333   K 3.8 01/20/2019 0333   CL 101 01/20/2019 0333   CO2 26 01/20/2019 0333   GLUCOSE 172 (H) 01/20/2019 0333   BUN 10 01/20/2019 0333   CREATININE 0.94 01/20/2019 0333   CALCIUM 9.1 01/20/2019 0333   GFRNONAA >60 01/20/2019 0333   GFRAA >60 01/20/2019 0333    INR No results found for: INR   Intake/Output Summary (Last 24 hours) at 01/20/2019 0759 Last data filed at 01/19/2019 2130 Gross per 24 hour  Intake 360 ml  Output 900 ml  Net -540 ml     Assessment/Plan:  68 y.o. male is s/p arteriogram with CTO L SFA 1 Day Post-Op   Patient will require L femoral endarterectomy with femoral to popliteal bypass L GSV appears adequate for conduit use Ok to proceed with surgery without further testing per cardiology Continue IV abx per ID Dr. Trula Slade will notify patient of surgery date   I[facility] [phone] 01/20/2019 7:59 AM   I agree with the above.  I have seen and evaluated the patient.  I have reviewed his vein map.  His saphenous vein appears adequate.  I  have placed him on the OR schedule for a left femoral-popliteal bypass graft next Wednesday.  From my perspective he can be discharged and return for his operation.  [name]

## 2019-01-20 NOTE — Progress Notes (Signed)
Peripherally Inserted Central Catheter/Midline Placement  The IV Nurse has discussed with the patient and/or persons authorized to consent for the patient, the purpose of this procedure and the potential benefits and risks involved with this procedure.  The benefits include less needle sticks, lab draws from the catheter, and the patient may be discharged home with the catheter. Risks include, but not limited to, infection, bleeding, blood clot (thrombus formation), and puncture of an artery; nerve damage and irregular heartbeat and possibility to perform a PICC exchange if needed/ordered by physician.  Alternatives to this procedure were also discussed.  Bard Power PICC patient education guide, fact sheet on infection prevention and patient information card has been provided to patient /or left at bedside.    PICC/Midline Placement Documentation  PICC Single Lumen [id number] PICC Right Basilic 42 cm 0 cm (Active)  Indication for Insertion or Continuance of Line Home intravenous therapies (PICC only) 01/20/19 1500  Site Assessment Clean;Dry;Intact 01/20/19 1500  Line Status Flushed;Saline locked;Blood return noted 01/20/19 1500  Dressing Type Transparent;Securing device 01/20/19 1500  Dressing Status Clean;Dry;Intact;Antimicrobial disc in place 01/20/19 1500  Line Care Connections checked and tightened 01/20/19 1500  Dressing Intervention New dressing;Other (Comment) 01/20/19 1500  Dressing Change Due 01/27/19 01/20/19 1500    Patient signed written consent  Virgilio Belling 01/20/2019, 3:43 PM

## 2019-01-20 NOTE — TOC Transition Note (Signed)
Transition of Care [facility]) - CM/SW Discharge Note Marvetta Gibbons RN, BSN Transitions of Care Unit 4E- RN Case Manager [phone]   Patient Details  Name: Chesley Ferrare MRN: [id number] Date of Birth: 10-18-1950  Transition of Care [facility]) CM/SW Contact:  Dawayne Patricia, RN Phone Number: 01/20/2019, 5:16 PM   Clinical Narrative:    Have received confirmation from [place] with [facility] that they can have IV abx ready from their pharmacy for AM dosing- pt can go home tonight after his does are given here from [facility] point of view. MD team notified have have stated that they will plan to d/c home tonight. Pt. And bedside RN updated of plan- 5pm abx running now. Pt's son will plan to be here around 730pm to provide transport per [facility].    Final next level of care: [place] Barriers to Discharge: No Barriers Identified   Patient Goals and CMS Choice Patient states their goals for this hospitalization and ongoing recovery are:: to get home CMS Medicare.gov Compare Post Acute Care list provided to:: Patient Choice offered to / list presented to : Patient  Discharge Placement               Home with [facility]         Discharge Plan and Services   Discharge Planning Services: CM Consult            DME Arranged: N/A DME Agency: NA       HH Arranged: RN, IV Antibiotics HH Agency: Other - See comment([facility] program- [place]) Date [place]: 01/20/19 Time HH Agency Contacted: 1440 Representative spoke with at [place]: [place] ([place]) Interventions     Readmission Risk Interventions Readmission Risk Prevention Plan 01/20/2019  Post Dischage Appt Complete  Medication Screening Complete  Transportation Screening Complete  Some recent data might be hidden

## 2019-01-20 NOTE — Progress Notes (Signed)
Physical Therapy Treatment Patient Details Name: Danny Carpenter MRN: [id number] DOB: 11/04/1950 Today's Date: 01/20/2019    History of Present Illness Pt is a 68 y.o. male initially admitted to [facility] with tremor, aphasia and incontinence s/p tPA. CT (-) and MRI with remote left cerebellar infarct. Neurology suspects tonic-clonic seizure. Pt also with left toe osteomyelitis. S/p R femoral access cath, abdominal aortogram 10/20. Potential for LLE bypass sx 10/28. PMH includes DM, obesity, OSA, COPD, HTN.   PT Comments    Pt progressing well with mobility. Independent with mobility and ADL tasks. Educ re: fall risk reduction, shoe wear, LE therex/ROM. Pt hopeful for d/c home until needing to return for additional LE sx. Has met short-term PT goals; pt has no further questions or concerns. Encouraged continued mobility during hospital admission. Will d/c acute PT.    Follow Up Recommendations  Outpatient PT(receiving services at [facility])     Equipment Recommendations  None recommended by PT    Recommendations for Other Services       Precautions / Restrictions Restrictions Weight Bearing Restrictions: No    Mobility  Bed Mobility               General bed mobility comments: pt sitting in recliner upon arrival  Transfers Overall transfer level: Independent Equipment used: None Transfers: Sit to/from Stand              Ambulation/Gait Ambulation/Gait assistance: Independent Gait Distance (Feet): 300 Feet Assistive device: None Gait Pattern/deviations: Step-through pattern;Decreased stride length;Antalgic   Gait velocity interpretation: 1.31 - 2.62 ft/sec, indicative of limited community ambulator General Gait Details: Gait becoming more antalgic with distance, pt c/o chronic R hip pain and L toe pain walking in house shoes; discussed rec for more supportive tennis shoes   Stairs Stairs: (Pt declined; uses ramp at home)           Wheelchair Mobility     Modified Rankin (Stroke Patients Only)       Balance Overall balance assessment: Needs assistance   Sitting balance-Leahy Scale: Normal       Standing balance-Leahy Scale: Good               High level balance activites: Side stepping;Backward walking;Direction changes;Turns;Head turns;Sudden stops High Level Balance Comments: No overt instability or LOB with higher level balance tasks; pt reaching for UE support on wall when reaching over to touch foot in hallway            Cognition Arousal/Alertness: Awake/alert Behavior During Therapy: [facility] for tasks assessed/performed Overall Cognitive Status: No family/caregiver present to determine baseline cognitive functioning Area of Impairment: Attention;Safety/judgement                   Current Attention Level: Selective;Alternating   Following Commands: Follows one step commands consistently Safety/Judgement: Decreased awareness of deficits     General Comments: Improved attention, although pt remains easily distracted and frequently changing conversation topics; likely some baseline cognition      Exercises Other Exercises Other Exercises: Pt c/o leg soreness - practiced seated gastroc stretch w/ sheet, seated hamstring stretch    General Comments        Pertinent Vitals/Pain Pain Assessment: Faces Faces Pain Scale: Hurts a little bit Pain Location: R hip with ambulation Pain Descriptors / Indicators: Discomfort Pain Intervention(s): Monitored during session    Home Living  Prior Function            PT Goals (current goals can now be found in the care plan section) Progress towards PT goals: Goals met/education completed, patient discharged from PT    Frequency    Min 3X/week      PT Plan Current plan remains appropriate    Co-evaluation              AM-PAC PT "6 Clicks" Mobility   Outcome Measure  Help needed turning from your back to your  side while in a flat bed without using bedrails?: None Help needed moving from lying on your back to sitting on the side of a flat bed without using bedrails?: None Help needed moving to and from a bed to a chair (including a wheelchair)?: None Help needed standing up from a chair using your arms (e.g., wheelchair or bedside chair)?: None Help needed to walk in hospital room?: None Help needed climbing 3-5 steps with a railing? : A Little 6 Click Score: 23    End of Session   Activity Tolerance: Patient tolerated treatment well Patient left: in chair;with call bell/phone within reach Nurse Communication: Mobility status PT Visit Diagnosis: Other abnormalities of gait and mobility (R26.89)     Time: 1351-1406 PT Time Calculation (min) (ACUTE ONLY): 15 min  Charges:  $Gait Training: 8-22 mins                    Mabeline Caras, PT, DPT Acute Rehabilitation Services  Pager [phone] Office [place] 01/20/2019, 3:36 PM

## 2019-01-20 NOTE — Progress Notes (Signed)
Subjective:   Danny Carpenter states he is doing well today. He was unable to rest much last night so was trying to catch up on sleep this morning. He is not having significant pain at this time and no new acute complaints. We discussed possibility of going home if we can get PICC line inserted and medications arranged to be administered at home, with instructions to return for surgery on the 28th. He stated he would prefer this to waiting inpatient. All questions and concerns addressed.    Objective:  Vital signs in last 24 hours: Vitals:   01/19/19 1154 01/19/19 1639 01/19/19 2126 01/20/19 0434  BP: (!) 167/68 (!) 159/73 (!) 166/80 (!) 156/73  Pulse: 64 83 76 77  Resp:  20 19 20   Temp:  98.3 F (36.8 C) 98 F (36.7 C) 98.5 F (36.9 C)  TempSrc:  Oral Oral Oral  SpO2: 98% 97% 98% 99%  Weight:      Height:        Physical Exam Vitals signs and nursing note reviewed.  Constitutional:      Appearance: He is obese.  Cardiovascular:     Rate and Rhythm: Normal rate and regular rhythm.     Heart sounds: No murmur.  Pulmonary:     Effort: Pulmonary effort is normal. No respiratory distress.  Skin:    General: Skin is warm.  Neurological:     Mental Status: He is alert and oriented to person, place, and time.  Psychiatric:        Mood and Affect: Mood normal.        Behavior: Behavior normal.    Assessment/Plan:  Active Problems:   CVA (cerebral vascular accident) ([place])   Diabetic foot infection ([place])   Subacute osteomyelitis of left foot ([place])   PVD (peripheral vascular disease) ([place])  # Diabetic Left Foot Ulcer:  # Osteomyelitis of the left 5th metatarsal  Chronic problem that patient has been experiencing for approximately 1 year that he has been treated on/off with antibiotics in short courses. Most recent outpatient MRI showed evidence of early osteomyelitis. During this admission, no wound cultures were obtained but blood cultures are NGTD. Patient initially had two  days of fevers on admission that have resolved. He is on Day 5 of Vancomycin, received 5 days of Cefepime and will be narrowed to Ceftriaxone today. Per ID, will need 6 weeks of IV antibiotics. Vascular has plans for intervention on the 28th, which will greatly help his chances of improving medically and/or recovering from amputation. Ortho has also seen the patient and will follow as needed.   Given that he has been afebrile for days now and no leukocytosis, he would be stable for discharge pending PICC placement and making sure [facility] RN can come administer. Will also need wound care. Okayed by vascular. Vascular will need to set up return for procedure next week and we will reach out to discuss this with them.   - Vascular surgery, ID and Ortho on board and we appreciate their recommendations  - Continue Vancomycin - Start Ceftriaxone  - PICC line placement  - HH RN + wound care  # Uncontrolled Type 2 Diabetes Mellitus:  A1C during this admission is 10.8%. No previous A1C on record. At home medications include Metformin 1000mg  BID, Novolog sliding scale, 8-10 units PRN if postprandial BG is over 300  and Xultophy 22 units QD. He states he is compliant with this regimen. For now, given multiple interventions, will  keep this regimen for simplicity sake. Recommend close outpatient follow up and adjustment as needed.   - Lantus 13 units in the morning - SSI - moderate  - Discharge with instructions to continue home regimen.   # Seizure  Patient was started on Keppra by Neurology. No driving for 6 months and patient spoke with Neurology regarding this.   - Keppra 500mg   - Follow up outpatient with neurology.  # Peripheral Arterial Disease Vascular surgery was consulted after ABIs showed moderate PAD in the left leg. Arteriogram on 10/21 showed multiple several areas of occlusion and he is being screened for a left fem-pop bypass graft, which has been scheduled for 01/27/2019.   - Vascular will  set up return for surgery.   # Hypertension  Home BP medication included lisinopril and spironolactone. He was started on Amlodipine since admission and resumed Lisinopril. Would likely benefit from a 3rd agent, but will instruct to follow up with PCP.   #Carotid Stenosis  Follow up outpatient with Vascular Surgery.    Dispo: Discharge home in 0-1 days.   Dr. Jose Persia Internal Medicine PGY-1  Pager: [phone] 01/20/2019, 6:49 AM

## 2019-01-20 NOTE — Progress Notes (Signed)
Internal Medicine Attending:   I saw and examined the patient. I reviewed the resident's note and I agree with the resident's findings and plan as documented in the resident's note.  Patient states that he feels well today and denies any new complaints.  Patient initially admitted to the hospital with possible CVA status post TPA at an outside facility.  He was found to have an infected left diabetic foot ulcer and osteomyelitis of the left fifth metatarsal here.  Vascular surgery follow-up and recommendations appreciated.  Patient will need a left femoropopliteal bypass as well as a left femoral and profunda endarterectomy.  Plavix is now on hold for this.  The likelihood of the surgery is October 28.  It is possible the patient could be discharged home and follow-up on the 28th for this surgery.  Will discuss this further with orthopedics as well as vascular surgery.  We will continue with IV antibiotics (ceftriaxone and vancomycin) for now.  Will place PICC line as patient will likely need 6 weeks of IV antibiotics.  ID follow-up recommendations appreciated.  If he does go home he will need home health nursing to help with wound care of his left foot.  Patient also noted to have uncontrolled type 2 diabetes with blood sugars in the 200s to 300s here.  Of note, patient did not receive his Lantus till 1:30 PM today.  Would consider increasing his Lantus to 18 units while here.  Neurology believes the patient had a generalized tonic-clonic seizure and started him on Keppra for this.  Neuro follow-up and recommendations appreciated.  No driving until patient remains seizure-free for 6 months.  Patient to follow-up with neurology as an outpatient.

## 2019-01-21 ENCOUNTER — Encounter: Payer: Self-pay | Admitting: Internal Medicine

## 2019-01-21 NOTE — Discharge Summary (Signed)
Name: Danny Carpenter MRN: [id number] DOB: [birth year] 68 y.o. PCP: Melony Overly, MD  Date of Admission: 01/16/2019 11:59 PM Date of Discharge: 01/20/2019 Attending Physician: Dr. Dareen Piano  Discharge Diagnosis: 1. Active Problems:   CVA (cerebral vascular accident) ([place])   Diabetic foot infection ([place])   Subacute osteomyelitis of left foot ([place])   PVD (peripheral vascular disease) ([place])   Diabetic foot ulcer with osteomyelitis [facility])    Discharge Medications: Allergies as of 01/20/2019   No Known Allergies     Medication List    STOP taking these medications   ARTHRITIS STRENGTH BC POWDER PO   clopidogrel 75 MG tablet Commonly known as: PLAVIX   fexofenadine 180 MG tablet Commonly known as: ALLEGRA     TAKE these medications   Align 4 MG Caps Take 4 mg by mouth daily.   amLODipine 10 MG tablet Commonly known as: NORVASC Take 1 tablet (10 mg total) by mouth daily.   aspirin 81 MG EC tablet Take 1 tablet (81 mg total) by mouth daily.   atorvastatin 40 MG tablet Commonly known as: LIPITOR Take 1 tablet (40 mg total) by mouth daily at 6 PM.   cefTRIAXone  IVPB Commonly known as: ROCEPHIN Inject 2 g into the vein daily. Indication:  Osteomyelitis  Last Day of Therapy:  02/28/2019 Labs - Once weekly:  CBC/D and BMP, Labs - Every other week:  ESR and CRP   diflunisal 500 MG Tabs tablet Commonly known as: DOLOBID Take 500 mg by mouth 2 (two) times daily as needed (for pain management- take with food or milk).   DULoxetine 60 MG capsule Commonly known as: CYMBALTA Take 60 mg by mouth daily.   fenofibrate 160 MG tablet Take 160 mg by mouth daily.   Gralise 600 MG Tabs Generic drug: Gabapentin (Once-Daily) Take 1,200 mg by mouth at bedtime.   levETIRAcetam 500 MG tablet Commonly known as: KEPPRA Take 1 tablet (500 mg total) by mouth 2 (two) times daily.   lisinopril 40 MG tablet Commonly known as: ZESTRIL Take 40 mg by mouth daily.   morphine  15 MG 12 hr tablet Commonly known as: MS CONTIN Take 15 mg by mouth every 12 (twelve) hours.   mupirocin ointment 2 % Commonly known as: BACTROBAN Apply 1 application topically See admin instructions. Apply topically as directed for wound care   NovoLOG FlexPen 100 UNIT/ML FlexPen Generic drug: insulin aspart Inject 6-8 Units into the skin See admin instructions. Inject 6-8 units into the skin two times a day (morning and early afternoon) as needed, per sliding scale: BGL 300-350 = 6 units; 351-400 = 8 units; >400 = CALL STAYWELL   pantoprazole 40 MG tablet Commonly known as: PROTONIX Take 40 mg by mouth daily before breakfast.   sodium chloride 1 g tablet Take 2 g by mouth See admin instructions. Take 2 grams by mouth in the morning and 2 grams at bedtime   spironolactone 25 MG tablet Commonly known as: ALDACTONE Take 25 mg by mouth daily.   tamsulosin 0.4 MG Caps capsule Commonly known as: FLOMAX Take 0.4 mg by mouth at bedtime.   traZODone 100 MG tablet Commonly known as: DESYREL Take 100 mg by mouth at bedtime.   vancomycin  IVPB Inject 1,000 mg into the vein every 12 (twelve) hours. Indication:  Osteomyelitis  Last Day of Therapy:  02/28/2019 Labs - Sunday/Monday:  CBC/D, BMP, and vancomycin trough. Labs - Thursday:  BMP and vancomycin trough Labs - Every other  week:  ESR and CRP   Xultophy 100-3.6 UNIT-MG/ML Sopn Generic drug: Insulin Degludec-Liraglutide Inject 22 Units into the skin daily before breakfast.            Home Infusion Instuctions  (From admission, onward)         Start     Ordered   01/20/19 0000  Home infusion instructions [facility] May follow [place] Dosing Protocol; May administer Cathflo as needed to maintain patency of vascular access device.; Flushing of vascular access device: per [facility] Protocol: 0.9% NaCl pre/post medica...    Question Answer Comment  Instructions May follow [place] Dosing Protocol   Instructions  May administer Cathflo as needed to maintain patency of vascular access device.   Instructions Flushing of vascular access device: per [facility] Protocol: 0.9% NaCl pre/post medication administration and prn patency; Heparin 100 u/ml, 36m for implanted ports and Heparin 10u/ml, 571mfor all other central venous catheters.   Instructions May follow [facility] Anaphylaxis Protocol for First Dose Administration in the home: 0.9% NaCl at 25-50 ml/hr to maintain IV access for protocol meds. Epinephrine 0.3 ml IV/IM PRN and Benadryl 25-50 IV/IM PRN s/s of anaphylaxis.   Instructions [facility] Infusion Coordinator (RN) to assist per patient IV care needs in the home PRN.      01/20/19 1829          Disposition and follow-up:   [facility]  At the hospital follow up visit please address:  - He should get the IV Ab for total of 6 weeks (end date:11/29) for culture negative diabetic foot ulcer/osteomyelitis. Please assess PICC line. -Scheduled for vascular surgery at 10/28 -Ensure he follows up with neurology and ID  2.  Labs / imaging needed at time of follow-up: CBC, BMP  3.  Pending labs/ test needing follow-up: none  Follow-up Appointments: Follow-up Information    [facility]. Schedule an appointment as soon as possible for a visit in 4 week(s).   Specialty: Neurology Contact information: [phone]     CaGolden CircleFNP Follow up.   Specialties: Family Medicine, Infectious Diseases Why: 02/22/19 at 2:30 pm. If you are unable to make this appointment please call our office to reschedule.  Contact information: [address] [place] [phone]           Hospital Course by problem list:  1. Culture negative osteomyelitis of the left foot:  [facility]  [facility] at outside facility. Neurology managed the probable stroke and also treated for probable seizure. How ever patient became febrile and found to have diabetic ulcer and transferred to IM service for further management. Work up showed infected culture negative diabetic foot ulcer and osteomyelitis of left foot (fifth metatarsal) as well as PAD with moderate arterial disease of left leg on ABI. He received IV antibiotic (Cefepime>Ceftriaxone and Vancomycin). He remained afebrile and BC remained negative.  Patient discharged home with PICC line and home health service to continue IV vancomycin and Ceftriaxone for total of 6 weeks and to follow up with ID outpatient.  2. PAD: Angiogram showed Left superficial femoral artery flush occlusion and 90% left popliteal stenosis with reconstitution of the popliteal artery and two-vessel runoff. Vascular surgery planned to perform endarterectomy and popliteal bypass procedure but this will be delayed because  patient was on Plavix, that should be held for at least 5 days prior to procedure. He scheduled for procedure (left femoropopliteal bypass and left femoral and profunda endarterectomy on 10/28.) Instructed to hold the Plavix meanwhile.  2. Uncontrolled type 2 diabetes:  HbA1c 10.8%. Home meds: Metformin 1051m BID, Novolog sliding scale, 8-10 units PRN (for postprandial BG>300), Xultophy 22 units QD.  With blood sugars in the 200s to 300s at hospital. Managed with Lantus and SSI. He will need close follow up with PCP for DM control.  3. CVA? Seizure?: [facility], neurology also believed that he had a generalized tonic-clonic seizure and started him on Keppra for this. Head CT and MRI with no acute intracaranial abnormalaity. No driving until patient remains seizure-free for 6 months.  Patient to follow-up with neurology as an outpatient.  Discharge Vitals:    BP 127/67 (BP Location: Left Arm)   Pulse 81   Temp 98.7 F (37.1 C) (Oral)   Resp 16   Ht 6' 1" (1.854 m)   Wt 91.9 kg   SpO2 97%   BMI 26.73 kg/m   Pertinent Labs, Studies, and Procedures:   Component     Latest Ref Rng & Units 01/17/2019          Sodium     135 - 145 mmol/L 130 (L)  Potassium     3.5 - 5.1 mmol/L 3.9  Chloride     98 - 111 mmol/L 93 (L)  CO2     22 - 32 mmol/L 24  Glucose     70 - 99 mg/dL 282 (H)  BUN     8 - 23 mg/dL 16  Creatinine     0.61 - 1.24 mg/dL 1.22  Calcium     8.9 - 10.3 mg/dL 9.0  GFR, Est Non African American     >60 mL/min >60  GFR, Est African American     >60 mL/min >60  Anion gap     5 - 15 13   Component     Latest Ref Rng & Units 01/28/2019          Sodium     135 - 145 mmol/L 131 (L)  Potassium     3.5 - 5.1 mmol/L 4.4  Chloride     98 - 111 mmol/L 97 (L)  CO2     22 - 32 mmol/L 25  Glucose     70 - 99 mg/dL 222 (H)  BUN     8 - 23 mg/dL 18  Creatinine     0.61 - 1.24 mg/dL 1.06  Calcium     8.9 - 10.3 mg/dL 8.4 (L)  GFR, Est Non African American     >60 mL/min >60  GFR, Est African American     >60 mL/min >60  Anion gap     5 - 15 9   Component     Latest Ref Rng & Units 01/17/2019         2:41 AM  Specimen Description      BLOOD LEFT ANTECUBITAL  Special Requests      BOTTLES DRAWN AEROBIC ONLY Blood Culture adequate volume  Culture      NO GROWTH 5 DAYS . . .  Report Status      01/22/2019 FINAL   Component     Latest Ref Rng & Units 01/17/2019  7:28 PM  Specimen Description      URINE, CLEAN CATCH  Special Requests      NONE  Culture      NO GROWTH . . .  Report Status      01/18/2019 FINAL   Component     Latest Ref Rng & Units 01/20/2019 01/20/2019        12:22 PM  4:47 PM  Glucose-Capillary     70 - 99 mg/dL 356 (H) 158 (H)   Brain MRI 01/17/2019  IMPRESSION: 1. No acute intracranial abnormality. 2. Moderate generalized atrophy. 3. Remote lacunar infarcts  of the left cerebellum. 4. Abnormal signal in the distal right vertebral artery, V4 segment, likely related to slow flow or occlusion. 5. Bilateral mastoid effusions.  EXAM: LEFT FOOT - 2 VIEW  01/17/2019   COMPARISON:  Foot MRI 12/18/2018 at [place]: Mild irregularity about the fifth proximal phalanx cortex. Subtle decreased density about the fifth metatarsal head. No definite soft tissue ulcer on provided views. Great toe is held in flexion on the AP view limiting assessment. Mild generalized soft tissue edema. No radiopaque foreign body.  IMPRESSION: 1. Subtle decreased density the fifth metatarsal head in the region of question osteomyelitis on MRI last month, suspicious for osteomyelitis. 2. Cortical irregularity of the fifth toe proximal phalanx may represent fracture or osteomyelitis. 3. Site of soft tissue ulcer not well demonstrated on current exam.  Vas [place] Abi With/wo Tbi 01/17/2019 Right: Resting right ankle-brachial index indicates mild right lower extremity arterial disease.  Left: Resting left ankle-brachial index indicates moderate left lower extremity arterial disease. Unable to obtain TBI due to patient movement, and patient somnolence.  Bilateral Carotid Dopplers  01/17/2019 Right Carotid: Velocities in the right ICA are consistent with a 1-39% stenosis.  Left Carotid: Velocities in the left ICA are consistent with a 1-39% stenosis.  Vertebrals:  Bilateral vertebral arteries demonstrate antegrade flow. Subclavians: Normal flow hemodynamics were seen in bilateral subclavian arteries.   Arteriorgram (Dr. Trula Slade) 01/19/2019 #1 Abdominal aortic aneurysm. Diameter cannot be determined from angiographic evaluation #2 Left superficial femoral artery flush occlusion and 90% left popliteal stenosis with reconstitution of the popliteal artery and two-vessel runoff #3 Diffusely diseased right superficial femoral artery with multiple areas  of hemodynamically significant stenosis #4 Patient will be scheduled for a left femoral and profunda endarterectomy and left femoral-popliteal bypass graft.  Transthoracic Echocardiogram  1. Left ventricular ejection fraction, by visual estimation, is 55 to 60%. The left ventricle has normal function. Normal left ventricular size. There is no left ventricular hypertrophy. 2. Frequent PVCs. No source of cardiac embolism detected. 3. Definity contrast agent was given IV to delineate the left ventricular endocardial borders. 4. Left ventricular diastolic Doppler parameters are consistent with impaired relaxation pattern of LV diastolic filling. 5. No LV thrombus seen contrast imaging. 6. Global right ventricle has normal systolic function.The right ventricular size is normal. No increase in right ventricular wall thickness. 7. Left atrial size was mildly dilated. 8. Right atrial size was normal. 9. Mild aortic valve annular calcification. 10. The mitral valve is degenerative. Trace mitral valve regurgitation. 11. The tricuspid valve is grossly normal. Tricuspid valve regurgitation is trivial. 12. The aortic valve is tricuspid Aortic valve regurgitation was not visualized by color flow Doppler. Structurally normal aortic valve, with no evidence of sclerosis or stenosis. 13. There is Mild thickening of the aortic valve. 14. The pulmonic valve was grossly normal. Pulmonic valve regurgitation is trivial by color  flow Doppler. 15. Mild plaque invoving the ascending aorta. 16. TR signal is inadequate for assessing pulmonary artery systolic pressure. 17. The inferior vena cava is normal in size with greater than 50% respiratory variability, suggesting right atrial pressure of 3 mmHg.   Discharge Instructions: Discharge Instructions    Ambulatory referral to Neurology   Complete by: As directed    Follow up with any provider at [facility] in 4-6 weeks for new onset seizure. Thanks.   Diet - low  sodium heart healthy   Complete by: As directed    Discharge instructions   Complete by: As directed    Thank you for allowing [place] to provide your care at [facility].  Please pay attention to following changes in your medications: 1-Do not take Plavix due to upcoming surgery you have (your surgeon will inform you about when to restart it after surgery) 2-take Aspirin 1 tablet every day 3-Take Amlodipine  4-Take Keppra 500 mg twice a day 5-You should get IV antibiotic for total of 6 weeks. A nurse at [facility] and your home will assist you with that. We also send physical therapist and occupational therapist to help you get your strength back. 5- Please follow the instruction on discharge summery for rest of your home medications. 6- Due to [place] DMV rule, you should avoid driving for 6 months from now (or 6 months after being seizure free) because probable seizure you had.   Please folloy surgery team instruction for admission on 01/27/2019 for your surgery. Make sure to follow up with infectious disease clinic, neurology as scheduled for you. And follow up with your primary care doctor.  Should you have any questions or concerns please call the internal medicine clinic at [phone].   Home infusion instructions [facility] May follow [place] Dosing Protocol; May administer Cathflo as needed to maintain patency of vascular access device.; Flushing of vascular access device: per [facility] Protocol: 0.9% NaCl pre/post medica...   Complete by: As directed    Instructions: May follow [place] Dosing Protocol   Instructions: May administer Cathflo as needed to maintain patency of vascular access device.   Instructions: Flushing of vascular access device: per [facility] Protocol: 0.9% NaCl pre/post medication administration and prn patency; Heparin 100 u/ml, 70m for implanted ports and Heparin 10u/ml, 579mfor all other central venous catheters.   Instructions: May follow [facility] Anaphylaxis  Protocol for First Dose Administration in the home: 0.9% NaCl at 25-50 ml/hr to maintain IV access for protocol meds. Epinephrine 0.3 ml IV/IM PRN and Benadryl 25-50 IV/IM PRN s/s of anaphylaxis.   Instructions: [facility] Coordinator (RN) to assist per patient IV care needs in the home PRN.   Increase activity slowly   Complete by: As directed       Signed: MaDewayne HatchMD 01/20/2019, 5:56 PM   Pager: _0 @

## 2019-01-22 LAB — CULTURE, BLOOD (ROUTINE X 2)
Culture: NO GROWTH
Culture: NO GROWTH
Special Requests: ADEQUATE
Special Requests: ADEQUATE

## 2019-01-26 ENCOUNTER — Other Ambulatory Visit: Payer: Self-pay | Admitting: *Deleted

## 2019-01-26 ENCOUNTER — Other Ambulatory Visit: Payer: Self-pay

## 2019-01-26 ENCOUNTER — Encounter (HOSPITAL_COMMUNITY): Payer: Self-pay | Admitting: *Deleted

## 2019-01-26 NOTE — Progress Notes (Signed)
Spoke with pt's daughter, Glenard Haring for pre-op call. Pt in same room to answer questions also. Pt states he had a "light" heart attack 15 years ago. Used to see a cardiologist but insurance has said that seeing a cardiologist is not necessary now. Pt is a type 2 Diabetic. Last A1C was 13.5 on 01/18/19. Glenard Haring states that pt's fasting blood sugar is usually between 150-220. Instructed her to have pt not take his Xultophy insulin in the AM. Instructed her to have pt check his blood sugar in the AM when he gets up and every 2 hours until he leaves for the hospital. If blood sugar is 300 or greater (this is where pt's SSI dose starts) take 1/2 of usual correction dose of Novolog insulin. If blood sugar is 70 or below, treat with 1/2 cup of clear juice (apple or cranberry) and recheck blood sugar 15 minutes after drinking juice. If blood sugar continues to be 70 or below, call the Short Stay department and ask to speak to a nurse. Angel voiced understanding.   Pt will go to the [facility] in the morning prior to arrival here at [facility] to get his Covid test done.   I reviewed the visitation policy with Glenard Haring and she voiced understanding.  Glenard Haring called me about 15-20 minutes after we had finished our phone call and stated that her father does not have the Amlodipine or the Keppra that was on his discharge papers for him to take. Mr. Tranchina gets his medications in blister packs but they are labeled. I did encourage Glenard Haring to call Mr.Tony' nurse from [facility] tomorrow to ask her if they were sent those prescriptions from the hospital. She voiced understanding.

## 2019-01-27 ENCOUNTER — Inpatient Hospital Stay (HOSPITAL_COMMUNITY): Payer: No Typology Code available for payment source | Admitting: Vascular Surgery

## 2019-01-27 ENCOUNTER — Encounter (HOSPITAL_COMMUNITY)
Admission: RE | Disposition: A | Discharge status: Home Health | Payer: Self-pay | Source: Home / Self Care | Attending: Surgery

## 2019-01-27 ENCOUNTER — Inpatient Hospital Stay (HOSPITAL_COMMUNITY)
Admission: RE | Admit: 2019-01-27 | Discharge: 2019-01-29 | DRG: 253 | Disposition: A | Discharge status: Home Health | Payer: No Typology Code available for payment source | Attending: Surgery | Admitting: Surgery

## 2019-01-27 ENCOUNTER — Encounter (HOSPITAL_COMMUNITY): Payer: Self-pay | Admitting: Vascular Surgery

## 2019-01-27 ENCOUNTER — Other Ambulatory Visit (HOSPITAL_COMMUNITY): Payer: No Typology Code available for payment source

## 2019-01-27 DIAGNOSIS — E1169 Type 2 diabetes mellitus with other specified complication: Secondary | ICD-10-CM | POA: Diagnosis present

## 2019-01-27 DIAGNOSIS — I70202 Unspecified atherosclerosis of native arteries of extremities, left leg: Secondary | ICD-10-CM | POA: Diagnosis present

## 2019-01-27 DIAGNOSIS — L0390 Cellulitis, unspecified: Secondary | ICD-10-CM | POA: Diagnosis not present

## 2019-01-27 DIAGNOSIS — E11628 Type 2 diabetes mellitus with other skin complications: Secondary | ICD-10-CM | POA: Diagnosis present

## 2019-01-27 DIAGNOSIS — E7800 Pure hypercholesterolemia, unspecified: Secondary | ICD-10-CM | POA: Diagnosis present

## 2019-01-27 DIAGNOSIS — F419 Anxiety disorder, unspecified: Secondary | ICD-10-CM | POA: Diagnosis present

## 2019-01-27 DIAGNOSIS — Z8673 Personal history of transient ischemic attack (TIA), and cerebral infarction without residual deficits: Secondary | ICD-10-CM

## 2019-01-27 DIAGNOSIS — K219 Gastro-esophageal reflux disease without esophagitis: Secondary | ICD-10-CM | POA: Diagnosis present

## 2019-01-27 DIAGNOSIS — Z20828 Contact with and (suspected) exposure to other viral communicable diseases: Secondary | ICD-10-CM | POA: Diagnosis present

## 2019-01-27 DIAGNOSIS — J449 Chronic obstructive pulmonary disease, unspecified: Secondary | ICD-10-CM | POA: Diagnosis present

## 2019-01-27 DIAGNOSIS — L97529 Non-pressure chronic ulcer of other part of left foot with unspecified severity: Secondary | ICD-10-CM | POA: Diagnosis present

## 2019-01-27 DIAGNOSIS — E1165 Type 2 diabetes mellitus with hyperglycemia: Secondary | ICD-10-CM | POA: Diagnosis present

## 2019-01-27 DIAGNOSIS — L089 Local infection of the skin and subcutaneous tissue, unspecified: Secondary | ICD-10-CM | POA: Diagnosis present

## 2019-01-27 DIAGNOSIS — Z6828 Body mass index (BMI) 28.0-28.9, adult: Secondary | ICD-10-CM | POA: Diagnosis not present

## 2019-01-27 DIAGNOSIS — I252 Old myocardial infarction: Secondary | ICD-10-CM | POA: Diagnosis not present

## 2019-01-27 DIAGNOSIS — I5042 Chronic combined systolic (congestive) and diastolic (congestive) heart failure: Secondary | ICD-10-CM | POA: Diagnosis present

## 2019-01-27 DIAGNOSIS — E669 Obesity, unspecified: Secondary | ICD-10-CM | POA: Diagnosis present

## 2019-01-27 DIAGNOSIS — F329 Major depressive disorder, single episode, unspecified: Secondary | ICD-10-CM | POA: Diagnosis present

## 2019-01-27 DIAGNOSIS — E1151 Type 2 diabetes mellitus with diabetic peripheral angiopathy without gangrene: Principal | ICD-10-CM | POA: Diagnosis present

## 2019-01-27 DIAGNOSIS — R569 Unspecified convulsions: Secondary | ICD-10-CM | POA: Diagnosis present

## 2019-01-27 DIAGNOSIS — G4733 Obstructive sleep apnea (adult) (pediatric): Secondary | ICD-10-CM | POA: Diagnosis present

## 2019-01-27 DIAGNOSIS — M869 Osteomyelitis, unspecified: Secondary | ICD-10-CM | POA: Diagnosis present

## 2019-01-27 DIAGNOSIS — I739 Peripheral vascular disease, unspecified: Secondary | ICD-10-CM

## 2019-01-27 DIAGNOSIS — I70245 Atherosclerosis of native arteries of left leg with ulceration of other part of foot: Secondary | ICD-10-CM

## 2019-01-27 DIAGNOSIS — I110 Hypertensive heart disease with heart failure: Secondary | ICD-10-CM | POA: Diagnosis present

## 2019-01-27 DIAGNOSIS — Z9889 Other specified postprocedural states: Secondary | ICD-10-CM | POA: Diagnosis not present

## 2019-01-27 DIAGNOSIS — I714 Abdominal aortic aneurysm, without rupture: Secondary | ICD-10-CM | POA: Diagnosis present

## 2019-01-27 DIAGNOSIS — E11621 Type 2 diabetes mellitus with foot ulcer: Secondary | ICD-10-CM | POA: Diagnosis present

## 2019-01-27 DIAGNOSIS — M1990 Unspecified osteoarthritis, unspecified site: Secondary | ICD-10-CM | POA: Diagnosis present

## 2019-01-27 HISTORY — PX: ENDARTERECTOMY FEMORAL: SHX5804

## 2019-01-27 HISTORY — DX: Anxiety disorder, unspecified: F41.9

## 2019-01-27 HISTORY — DX: Peripheral vascular disease, unspecified: I73.9

## 2019-01-27 HISTORY — DX: Depression, unspecified: F32.A

## 2019-01-27 HISTORY — PX: VEIN HARVEST: SHX6363

## 2019-01-27 HISTORY — PX: FEMORAL-POPLITEAL BYPASS GRAFT: SHX937

## 2019-01-27 HISTORY — DX: Unspecified convulsions: R56.9

## 2019-01-27 HISTORY — DX: Abdominal aortic aneurysm, without rupture: I71.4

## 2019-01-27 HISTORY — DX: Heart failure, unspecified: I50.9

## 2019-01-27 HISTORY — DX: Unspecified osteoarthritis, unspecified site: M19.90

## 2019-01-27 HISTORY — DX: Acute myocardial infarction, unspecified: I21.9

## 2019-01-27 HISTORY — PX: PATCH ANGIOPLASTY: SHX6230

## 2019-01-27 LAB — COMPREHENSIVE METABOLIC PANEL
ALT: 19 U/L (ref 0–44)
AST: 20 U/L (ref 15–41)
Albumin: 3.8 g/dL (ref 3.5–5.0)
Alkaline Phosphatase: 57 U/L (ref 38–126)
Anion gap: 13 (ref 5–15)
BUN: 18 mg/dL (ref 8–23)
CO2: 26 mmol/L (ref 22–32)
Calcium: 9.4 mg/dL (ref 8.9–10.3)
Chloride: 92 mmol/L — ABNORMAL LOW (ref 98–111)
Creatinine, Ser: 1.27 mg/dL — ABNORMAL HIGH (ref 0.61–1.24)
GFR calc Af Amer: >60 mL/min (ref >60)
GFR calc non Af Amer: 58 mL/min — ABNORMAL LOW (ref >60)
Glucose, Bld: 299 mg/dL — ABNORMAL HIGH (ref 70–99)
Potassium: 4.3 mmol/L (ref 3.5–5.1)
Sodium: 131 mmol/L — ABNORMAL LOW (ref 135–145)
Total Bilirubin: 0.7 mg/dL (ref 0.3–1.2)
Total Protein: 7.8 g/dL (ref 6.5–8.1)

## 2019-01-27 LAB — URINALYSIS, ROUTINE W REFLEX MICROSCOPIC
Bacteria, UA: NONE SEEN
Bilirubin Urine: NEGATIVE
Glucose, UA: >=500 mg/dL — AB
Hgb urine dipstick: NEGATIVE
Ketones, ur: NEGATIVE mg/dL
Leukocytes,Ua: NEGATIVE
Nitrite: NEGATIVE
Protein, ur: 100 mg/dL — AB
Specific Gravity, Urine: 1.021 (ref 1.005–1.030)
pH: 6 (ref 5.0–8.0)

## 2019-01-27 LAB — CREATININE, SERUM
Creatinine, Ser: 0.93 mg/dL (ref 0.61–1.24)
GFR calc Af Amer: >60 mL/min (ref >60)
GFR calc non Af Amer: >60 mL/min (ref >60)

## 2019-01-27 LAB — CBC
HCT: 33.5 % — ABNORMAL LOW (ref 39.0–52.0)
Hemoglobin: 11.1 g/dL — ABNORMAL LOW (ref 13.0–17.0)
MCH: 29.8 pg (ref 26.0–34.0)
MCHC: 33.1 g/dL (ref 30.0–36.0)
MCV: 90.1 fL (ref 80.0–100.0)
Platelets: 271 10*3/uL (ref 150–400)
RBC: 3.72 MIL/uL — ABNORMAL LOW (ref 4.22–5.81)
RDW: 14.2 % (ref 11.5–15.5)
WBC: 14.6 10*3/uL — ABNORMAL HIGH (ref 4.0–10.5)
nRBC: 0 % (ref 0.0–0.2)

## 2019-01-27 LAB — PROTIME-INR
INR: 1 (ref 0.8–1.2)
Prothrombin Time: 13.2 seconds (ref 11.4–15.2)

## 2019-01-27 LAB — TYPE AND SCREEN
ABO/RH(D): A POS
Antibody Screen: NEGATIVE

## 2019-01-27 LAB — GLUCOSE, CAPILLARY
Glucose-Capillary: 281 mg/dL — ABNORMAL HIGH (ref 70–99)
Glucose-Capillary: 170 mg/dL — ABNORMAL HIGH (ref 70–99)

## 2019-01-27 LAB — SURGICAL PCR SCREEN
MRSA, PCR: NEGATIVE
Staphylococcus aureus: NEGATIVE

## 2019-01-27 LAB — SARS CORONAVIRUS 2 BY RT PCR (HOSPITAL ORDER, PERFORMED IN ~~LOC~~ HOSPITAL LAB): SARS Coronavirus 2: NEGATIVE

## 2019-01-27 LAB — ABO/RH: ABO/RH(D): A POS

## 2019-01-27 LAB — APTT: aPTT: 33 seconds (ref 24–36)

## 2019-01-27 SURGERY — ENDARTERECTOMY, FEMORAL
Anesthesia: General | Site: Leg Upper | Laterality: Left

## 2019-01-27 MED ORDER — MAGNESIUM SULFATE 2 GM/50ML IV SOLN: 2.0000 g | Freq: Every day | INTRAVENOUS | Status: DC | PRN

## 2019-01-27 MED ORDER — HEPARIN SODIUM (PORCINE) 5000 UNIT/ML IJ SOLN
5000.0000 [IU] | Freq: Three times a day (TID) | INTRAMUSCULAR | Status: DC
  Administered 2019-01-28 – 2019-01-29 (×4): 5000 [IU] via SUBCUTANEOUS
  Filled 2019-01-27 (×4): qty 1

## 2019-01-27 MED ORDER — SODIUM CHLORIDE 0.9 % IV SOLN
INTRAVENOUS | Status: AC
  Administered 2019-01-27: 22:00:00 via INTRAVENOUS

## 2019-01-27 MED ORDER — ATORVASTATIN CALCIUM 40 MG PO TABS
40.0000 mg | ORAL_TABLET | Freq: Every day | ORAL | Status: DC
  Administered 2019-01-27 – 2019-01-28 (×2): 40 mg via ORAL
  Filled 2019-01-27 (×2): qty 1

## 2019-01-27 MED ORDER — FENTANYL CITRATE (PF) 100 MCG/2ML IJ SOLN: 25.0000 ug | INTRAMUSCULAR | Status: DC | PRN

## 2019-01-27 MED ORDER — LACTATED RINGERS IV SOLN: INTRAVENOUS | Status: DC

## 2019-01-27 MED ORDER — OXYCODONE HCL 5 MG PO TABS: 5.0000 mg | ORAL_TABLET | Freq: Once | ORAL | Status: AC | PRN

## 2019-01-27 MED ORDER — GABAPENTIN (ONCE-DAILY) 600 MG PO TABS: 1200.0000 mg | ORAL_TABLET | Freq: Every day | ORAL | Status: DC

## 2019-01-27 MED ORDER — LISINOPRIL 40 MG PO TABS
40.0000 mg | ORAL_TABLET | Freq: Every day | ORAL | Status: DC
  Administered 2019-01-29: 40 mg via ORAL
  Filled 2019-01-27: qty 1

## 2019-01-27 MED ORDER — ONDANSETRON HCL 4 MG/2ML IJ SOLN
INTRAMUSCULAR | Status: DC | PRN
  Administered 2019-01-27: 4 mg via INTRAVENOUS

## 2019-01-27 MED ORDER — ONDANSETRON HCL 4 MG/2ML IJ SOLN: 4.0000 mg | Freq: Four times a day (QID) | INTRAMUSCULAR | Status: DC | PRN

## 2019-01-27 MED ORDER — LABETALOL HCL 5 MG/ML IV SOLN: 10.0000 mg | INTRAVENOUS | Status: DC | PRN

## 2019-01-27 MED ORDER — CHLORHEXIDINE GLUCONATE 4 % EX LIQD: 60.0000 mL | Freq: Once | CUTANEOUS | Status: DC

## 2019-01-27 MED ORDER — DEXMEDETOMIDINE HCL 200 MCG/2ML IV SOLN
INTRAVENOUS | Status: DC | PRN
  Administered 2019-01-27: 12 ug via INTRAVENOUS
  Administered 2019-01-27: 8 ug via INTRAVENOUS

## 2019-01-27 MED ORDER — DULOXETINE HCL 60 MG PO CPEP
60.0000 mg | ORAL_CAPSULE | Freq: Every day | ORAL | Status: DC
  Administered 2019-01-28 – 2019-01-29 (×2): 60 mg via ORAL
  Filled 2019-01-27 (×3): qty 1

## 2019-01-27 MED ORDER — POLYETHYLENE GLYCOL 3350 17 G PO PACK: 17.0000 g | PACK | Freq: Every day | ORAL | Status: DC | PRN

## 2019-01-27 MED ORDER — SODIUM CHLORIDE 0.9 % IV SOLN
INTRAVENOUS | Status: DC | PRN
  Administered 2019-01-27: 13:00:00

## 2019-01-27 MED ORDER — DOCUSATE SODIUM 100 MG PO CAPS
100.0000 mg | ORAL_CAPSULE | Freq: Every day | ORAL | Status: DC
  Administered 2019-01-28 – 2019-01-29 (×2): 100 mg via ORAL
  Filled 2019-01-27 (×2): qty 1

## 2019-01-27 MED ORDER — INSULIN DEGLUDEC-LIRAGLUTIDE 100-3.6 UNIT-MG/ML ~~LOC~~ SOPN: 22.0000 [IU] | PEN_INJECTOR | Freq: Every day | SUBCUTANEOUS | Status: DC

## 2019-01-27 MED ORDER — ACETAMINOPHEN 325 MG PO TABS
325.0000 mg | ORAL_TABLET | ORAL | Status: DC | PRN
  Administered 2019-01-28: 650 mg via ORAL
  Filled 2019-01-27: qty 2

## 2019-01-27 MED ORDER — AMLODIPINE BESYLATE 10 MG PO TABS
10.0000 mg | ORAL_TABLET | Freq: Every day | ORAL | Status: DC
  Administered 2019-01-27 – 2019-01-29 (×3): 10 mg via ORAL
  Filled 2019-01-27 (×3): qty 1

## 2019-01-27 MED ORDER — LACTATED RINGERS IV SOLN
INTRAVENOUS | Status: DC | PRN
  Administered 2019-01-27: 13:00:00 via INTRAVENOUS

## 2019-01-27 MED ORDER — INSULIN ASPART 100 UNIT/ML ~~LOC~~ SOLN
10.0000 [IU] | Freq: Once | SUBCUTANEOUS | Status: AC
  Administered 2019-01-27 (×2): 10 [IU] via SUBCUTANEOUS
  Filled 2019-01-27: qty 0.1

## 2019-01-27 MED ORDER — TRAZODONE HCL 100 MG PO TABS
100.0000 mg | ORAL_TABLET | Freq: Every day | ORAL | Status: DC
  Administered 2019-01-27 – 2019-01-28 (×2): 100 mg via ORAL
  Filled 2019-01-27 (×2): qty 1

## 2019-01-27 MED ORDER — SUGAMMADEX SODIUM 200 MG/2ML IV SOLN
INTRAVENOUS | Status: DC | PRN
  Administered 2019-01-27: 200 mg via INTRAVENOUS

## 2019-01-27 MED ORDER — INSULIN ASPART 100 UNIT/ML ~~LOC~~ SOLN
0.0000 [IU] | Freq: Three times a day (TID) | SUBCUTANEOUS | Status: DC
  Administered 2019-01-28: 15 [IU] via SUBCUTANEOUS
  Administered 2019-01-28 (×2): 5 [IU] via SUBCUTANEOUS
  Administered 2019-01-29: 3 [IU] via SUBCUTANEOUS

## 2019-01-27 MED ORDER — PROPOFOL 10 MG/ML IV BOLUS
INTRAVENOUS | Status: DC | PRN
  Administered 2019-01-27: 110 mg via INTRAVENOUS

## 2019-01-27 MED ORDER — LEVETIRACETAM 500 MG PO TABS
500.0000 mg | ORAL_TABLET | Freq: Two times a day (BID) | ORAL | Status: DC
  Administered 2019-01-27 – 2019-01-29 (×4): 500 mg via ORAL
  Filled 2019-01-27 (×4): qty 1

## 2019-01-27 MED ORDER — OXYCODONE HCL 5 MG/5ML PO SOLN
5.0000 mg | Freq: Once | ORAL | Status: AC | PRN
  Administered 2019-01-27: 5 mg via ORAL

## 2019-01-27 MED ORDER — OXYCODONE HCL 5 MG/5ML PO SOLN
ORAL | Status: AC
  Filled 2019-01-27: qty 5

## 2019-01-27 MED ORDER — GUAIFENESIN-DM 100-10 MG/5ML PO SYRP: 15.0000 mL | ORAL_SOLUTION | ORAL | Status: DC | PRN

## 2019-01-27 MED ORDER — ROCURONIUM BROMIDE 50 MG/5ML IV SOSY
PREFILLED_SYRINGE | INTRAVENOUS | Status: DC | PRN
  Administered 2019-01-27 (×2): 50 mg via INTRAVENOUS

## 2019-01-27 MED ORDER — FENTANYL CITRATE (PF) 100 MCG/2ML IJ SOLN
INTRAMUSCULAR | Status: DC | PRN
  Administered 2019-01-27 (×2): 50 ug via INTRAVENOUS
  Administered 2019-01-27: 150 ug via INTRAVENOUS

## 2019-01-27 MED ORDER — OXYCODONE-ACETAMINOPHEN 5-325 MG PO TABS
1.0000 | ORAL_TABLET | ORAL | Status: DC | PRN
  Administered 2019-01-28 – 2019-01-29 (×6): 2 via ORAL
  Filled 2019-01-27 (×6): qty 2

## 2019-01-27 MED ORDER — FENOFIBRATE 160 MG PO TABS
160.0000 mg | ORAL_TABLET | Freq: Every day | ORAL | Status: DC
  Administered 2019-01-28 – 2019-01-29 (×2): 160 mg via ORAL
  Filled 2019-01-27 (×2): qty 1

## 2019-01-27 MED ORDER — ALUM & MAG HYDROXIDE-SIMETH 200-200-20 MG/5ML PO SUSP: 15.0000 mL | ORAL | Status: DC | PRN

## 2019-01-27 MED ORDER — CEFAZOLIN SODIUM-DEXTROSE 2-4 GM/100ML-% IV SOLN
2.0000 g | INTRAVENOUS | Status: AC
  Administered 2019-01-27 (×2): 2 g via INTRAVENOUS
  Filled 2019-01-27: qty 100

## 2019-01-27 MED ORDER — ONDANSETRON HCL 4 MG/2ML IJ SOLN
INTRAMUSCULAR | Status: AC
  Filled 2019-01-27: qty 2

## 2019-01-27 MED ORDER — PROPOFOL 10 MG/ML IV BOLUS
INTRAVENOUS | Status: AC
  Filled 2019-01-27: qty 20

## 2019-01-27 MED ORDER — HEPARIN SODIUM (PORCINE) 1000 UNIT/ML IJ SOLN
INTRAMUSCULAR | Status: DC | PRN
  Administered 2019-01-27: 2000 [IU] via INTRAVENOUS
  Administered 2019-01-27: 5000 [IU] via INTRAVENOUS
  Administered 2019-01-27: 10000 [IU] via INTRAVENOUS

## 2019-01-27 MED ORDER — SODIUM CHLORIDE 0.9 % IV SOLN: INTRAVENOUS | Status: DC

## 2019-01-27 MED ORDER — HYDRALAZINE HCL 20 MG/ML IJ SOLN: 5.0000 mg | INTRAMUSCULAR | Status: DC | PRN

## 2019-01-27 MED ORDER — CEFAZOLIN SODIUM-DEXTROSE 2-4 GM/100ML-% IV SOLN
2.0000 g | Freq: Three times a day (TID) | INTRAVENOUS | Status: AC
  Administered 2019-01-28 (×2): 2 g via INTRAVENOUS
  Filled 2019-01-27 (×3): qty 100

## 2019-01-27 MED ORDER — ASPIRIN EC 81 MG PO TBEC
81.0000 mg | DELAYED_RELEASE_TABLET | Freq: Every day | ORAL | Status: DC
  Administered 2019-01-28 – 2019-01-29 (×2): 81 mg via ORAL
  Filled 2019-01-27 (×3): qty 1

## 2019-01-27 MED ORDER — DEXAMETHASONE SODIUM PHOSPHATE 10 MG/ML IJ SOLN
INTRAMUSCULAR | Status: AC
  Filled 2019-01-27: qty 1

## 2019-01-27 MED ORDER — PHENYLEPHRINE 40 MCG/ML (10ML) SYRINGE FOR IV PUSH (FOR BLOOD PRESSURE SUPPORT)
PREFILLED_SYRINGE | INTRAVENOUS | Status: AC
  Filled 2019-01-27: qty 10

## 2019-01-27 MED ORDER — LIDOCAINE 2% (20 MG/ML) 5 ML SYRINGE
INTRAMUSCULAR | Status: AC
  Filled 2019-01-27: qty 5

## 2019-01-27 MED ORDER — ONDANSETRON HCL 4 MG/2ML IJ SOLN: 4.0000 mg | Freq: Once | INTRAMUSCULAR | Status: DC | PRN

## 2019-01-27 MED ORDER — GABAPENTIN 300 MG PO CAPS
600.0000 mg | ORAL_CAPSULE | Freq: Two times a day (BID) | ORAL | Status: DC
  Administered 2019-01-27 – 2019-01-29 (×4): 600 mg via ORAL
  Filled 2019-01-27 (×4): qty 2

## 2019-01-27 MED ORDER — HEMOSTATIC AGENTS (NO CHARGE) OPTIME
TOPICAL | Status: DC | PRN
  Administered 2019-01-27: 1 via TOPICAL

## 2019-01-27 MED ORDER — MUPIROCIN 2 % EX OINT
1.0000 "application " | TOPICAL_OINTMENT | Freq: Once | CUTANEOUS | Status: DC
  Filled 2019-01-27: qty 22

## 2019-01-27 MED ORDER — POTASSIUM CHLORIDE CRYS ER 20 MEQ PO TBCR: 20.0000 meq | EXTENDED_RELEASE_TABLET | Freq: Every day | ORAL | Status: DC | PRN

## 2019-01-27 MED ORDER — MIDAZOLAM HCL 2 MG/2ML IJ SOLN
INTRAMUSCULAR | Status: AC
  Filled 2019-01-27: qty 2

## 2019-01-27 MED ORDER — MORPHINE SULFATE (PF) 2 MG/ML IV SOLN
2.0000 mg | INTRAVENOUS | Status: DC | PRN
  Filled 2019-01-27: qty 1

## 2019-01-27 MED ORDER — SODIUM CHLORIDE 0.9 % IV SOLN: 500.0000 mL | Freq: Once | INTRAVENOUS | Status: DC | PRN

## 2019-01-27 MED ORDER — ACETAMINOPHEN 325 MG RE SUPP: 325.0000 mg | RECTAL | Status: DC | PRN

## 2019-01-27 MED ORDER — PHENYLEPHRINE HCL-NACL 10-0.9 MG/250ML-% IV SOLN
INTRAVENOUS | Status: DC | PRN
  Administered 2019-01-27: 60 ug/min via INTRAVENOUS

## 2019-01-27 MED ORDER — PHENYLEPHRINE 40 MCG/ML (10ML) SYRINGE FOR IV PUSH (FOR BLOOD PRESSURE SUPPORT)
PREFILLED_SYRINGE | INTRAVENOUS | Status: DC | PRN
  Administered 2019-01-27: 120 ug via INTRAVENOUS
  Administered 2019-01-27: 80 ug via INTRAVENOUS

## 2019-01-27 MED ORDER — SPIRONOLACTONE 25 MG PO TABS
25.0000 mg | ORAL_TABLET | Freq: Every day | ORAL | Status: DC
  Administered 2019-01-27 – 2019-01-29 (×3): 25 mg via ORAL
  Filled 2019-01-27 (×3): qty 1

## 2019-01-27 MED ORDER — 0.9 % SODIUM CHLORIDE (POUR BTL) OPTIME
TOPICAL | Status: DC | PRN
  Administered 2019-01-27: 13:00:00 1000 mL

## 2019-01-27 MED ORDER — SODIUM CHLORIDE 0.9 % IV SOLN
INTRAVENOUS | Status: AC
  Filled 2019-01-27: qty 1.2

## 2019-01-27 MED ORDER — METOPROLOL TARTRATE 5 MG/5ML IV SOLN: 2.0000 mg | INTRAVENOUS | Status: DC | PRN

## 2019-01-27 MED ORDER — ROCURONIUM BROMIDE 10 MG/ML (PF) SYRINGE
PREFILLED_SYRINGE | INTRAVENOUS | Status: AC
  Filled 2019-01-27: qty 10

## 2019-01-27 MED ORDER — PROTAMINE SULFATE 10 MG/ML IV SOLN
INTRAVENOUS | Status: DC | PRN
  Administered 2019-01-27: 50 mg via INTRAVENOUS

## 2019-01-27 MED ORDER — PHENOL 1.4 % MT LIQD: 1.0000 | OROMUCOSAL | Status: DC | PRN

## 2019-01-27 MED ORDER — PANTOPRAZOLE SODIUM 40 MG PO TBEC
40.0000 mg | DELAYED_RELEASE_TABLET | Freq: Every day | ORAL | Status: DC
  Administered 2019-01-28 – 2019-01-29 (×2): 40 mg via ORAL
  Filled 2019-01-27 (×2): qty 1

## 2019-01-27 MED ORDER — TAMSULOSIN HCL 0.4 MG PO CAPS
0.4000 mg | ORAL_CAPSULE | Freq: Every day | ORAL | Status: DC
  Administered 2019-01-27 – 2019-01-28 (×2): 0.4 mg via ORAL
  Filled 2019-01-27 (×2): qty 1

## 2019-01-27 MED ORDER — FENTANYL CITRATE (PF) 250 MCG/5ML IJ SOLN
INTRAMUSCULAR | Status: AC
  Filled 2019-01-27: qty 5

## 2019-01-27 MED ORDER — MIDAZOLAM HCL 5 MG/5ML IJ SOLN
INTRAMUSCULAR | Status: DC | PRN
  Administered 2019-01-27 (×2): 1 mg via INTRAVENOUS

## 2019-01-27 MED ORDER — LIDOCAINE 2% (20 MG/ML) 5 ML SYRINGE
INTRAMUSCULAR | Status: DC | PRN
  Administered 2019-01-27: 60 mg via INTRAVENOUS

## 2019-01-27 MED ORDER — HEPARIN SODIUM (PORCINE) 1000 UNIT/ML IJ SOLN
INTRAMUSCULAR | Status: AC
  Filled 2019-01-27: qty 1

## 2019-01-27 MED ORDER — DIFLUNISAL 500 MG PO TABS
500.0000 mg | ORAL_TABLET | Freq: Two times a day (BID) | ORAL | Status: DC | PRN
  Filled 2019-01-27: qty 1

## 2019-01-27 SURGICAL SUPPLY — 56 items
ADH SKN CLS APL DERMABOND .7 (GAUZE/BANDAGES/DRESSINGS) ×4
BAG ISL DRAPE 18X18 STRL (DRAPES) ×2
BAG ISOLATION DRAPE 18X18 (DRAPES) IMPLANT
BANDAGE ESMARK 6X9 LF (GAUZE/BANDAGES/DRESSINGS) IMPLANT
BNDG CMPR 9X6 STRL LF SNTH (GAUZE/BANDAGES/DRESSINGS) ×2
BNDG ESMARK 6X9 LF (GAUZE/BANDAGES/DRESSINGS) ×4
CANISTER SUCT 3000ML PPV (MISCELLANEOUS) ×4 IMPLANT
CANNULA VESSEL 3MM 2 BLNT TIP (CANNULA) ×4 IMPLANT
CLIP VESOCCLUDE MED 24/CT (CLIP) ×4 IMPLANT
CLIP VESOCCLUDE SM WIDE 24/CT (CLIP) ×4 IMPLANT
COVER PROBE W GEL 5X96 (DRAPES) ×2 IMPLANT
COVER WAND RF STERILE (DRAPES) ×4 IMPLANT
CUFF TOURN SGL QUICK 24 (TOURNIQUET CUFF) ×4
CUFF TRNQT CYL 24X4X16.5-23 (TOURNIQUET CUFF) IMPLANT
DERMABOND ADVANCED (GAUZE/BANDAGES/DRESSINGS) ×4
DERMABOND ADVANCED .7 DNX12 (GAUZE/BANDAGES/DRESSINGS) ×2 IMPLANT
DRAPE ISOLATION BAG 18X18 (DRAPES) ×2
ELECT REM PT RETURN 9FT ADLT (ELECTROSURGICAL) ×4
ELECTRODE REM PT RTRN 9FT ADLT (ELECTROSURGICAL) ×2 IMPLANT
GLOVE BIO SURGEON STRL SZ 6.5 (GLOVE) ×1 IMPLANT
GLOVE BIO SURGEON STRL SZ7.5 (GLOVE) ×2 IMPLANT
GLOVE BIO SURGEONS STRL SZ 6.5 (GLOVE) ×1
GLOVE BIOGEL PI IND STRL 7.0 (GLOVE) IMPLANT
GLOVE BIOGEL PI IND STRL 7.5 (GLOVE) ×2 IMPLANT
GLOVE BIOGEL PI INDICATOR 7.0 (GLOVE) ×2
GLOVE BIOGEL PI INDICATOR 7.5 (GLOVE) ×6
GLOVE SURG SS PI 6.5 STRL IVOR (GLOVE) ×4 IMPLANT
GLOVE SURG SS PI 7.5 STRL IVOR (GLOVE) ×8 IMPLANT
GOWN STRL REUS W/ TWL LRG LVL3 (GOWN DISPOSABLE) ×4 IMPLANT
GOWN STRL REUS W/ TWL XL LVL3 (GOWN DISPOSABLE) ×2 IMPLANT
GOWN STRL REUS W/TWL LRG LVL3 (GOWN DISPOSABLE) ×12
GOWN STRL REUS W/TWL XL LVL3 (GOWN DISPOSABLE) ×4
HEMOSTAT SNOW SURGICEL 2X4 (HEMOSTASIS) ×2 IMPLANT
KIT BASIN OR (CUSTOM PROCEDURE TRAY) ×4 IMPLANT
KIT TURNOVER KIT B (KITS) ×4 IMPLANT
NS IRRIG 1000ML POUR BTL (IV SOLUTION) ×8 IMPLANT
PACK PERIPHERAL VASCULAR (CUSTOM PROCEDURE TRAY) ×4 IMPLANT
PAD ARMBOARD 7.5X6 YLW CONV (MISCELLANEOUS) ×8 IMPLANT
SPONGE LAP 18X18 RF (DISPOSABLE) ×2 IMPLANT
SUT PROLENE 5 0 C 1 24 (SUTURE) ×4 IMPLANT
SUT PROLENE 6 0 BV (SUTURE) ×6 IMPLANT
SUT PROLENE 6 0 C 1 30 (SUTURE) ×6 IMPLANT
SUT PROLENE 7 0 BV 1 (SUTURE) ×2 IMPLANT
SUT SILK 2 0 SH (SUTURE) ×6 IMPLANT
SUT SILK 3 0 (SUTURE) ×4
SUT SILK 3-0 18XBRD TIE 12 (SUTURE) IMPLANT
SUT VIC AB 2-0 CT1 27 (SUTURE) ×8
SUT VIC AB 2-0 CT1 TAPERPNT 27 (SUTURE) ×4 IMPLANT
SUT VIC AB 3-0 SH 27 (SUTURE) ×20
SUT VIC AB 3-0 SH 27X BRD (SUTURE) ×4 IMPLANT
SUT VIC AB 4-0 PS2 18 (SUTURE) ×2 IMPLANT
SUT VICRYL 4-0 PS2 18IN ABS (SUTURE) ×8 IMPLANT
TOWEL GREEN STERILE (TOWEL DISPOSABLE) ×4 IMPLANT
TRAY FOLEY MTR SLVR 16FR STAT (SET/KITS/TRAYS/PACK) ×4 IMPLANT
UNDERPAD 30X30 (UNDERPADS AND DIAPERS) ×4 IMPLANT
WATER STERILE IRR 1000ML POUR (IV SOLUTION) ×4 IMPLANT

## 2019-01-27 NOTE — Interval H&P Note (Signed)
History and Physical Interval Note:  01/27/2019 12:21 PM  Danny Carpenter  has presented today for surgery, with the diagnosis of left femoral occlusion.  The various methods of treatment have been discussed with the patient and family. After consideration of risks, benefits and other options for treatment, the patient has consented to  Procedure(s): ENDARTERECTOMY FEMORAL AND PROFUNDA (Left) BYPASS GRAFT FEMORAL-POPLITEAL ARTERY (Left) as a surgical intervention.  The patient's history has been reviewed, patient examined, no change in status, stable for surgery.  I have reviewed the patient's chart and labs.  Questions were answered to the patient's satisfaction.     [name]

## 2019-01-27 NOTE — Op Note (Signed)
Patient name: Danny Carpenter MRN: [id number] DOB: [birth year] Sex: male  01/27/2019 Pre-operative Diagnosis: Left foot ulcer Post-operative diagnosis:  Same Surgeon:  [name] Assistants:  Arlee Muslim Procedure:   #1: Left common femoral and profundofemoral endarterectomy with vein patch angioplasty   #2: Left femoral to above-knee popliteal artery bypass graft with reversed ipsilateral translocated saphenous vein Anesthesia: General Blood Loss: 150 cc Specimens: None  Findings: Extensive plaque within the common femoral artery.  The patient had a high-grade stenosis in the profundofemoral artery just proximal to its first major branch.  Endarterectomy was performed of the common femoral and profundofemoral artery and a vein patch was placed.  I then made a venotomy in the patch and performed a common femoral to above-knee popliteal artery bypass graft with reverse saphenous vein.  He had a 4 mm vein throughout.  Indications: The patient was admitted to the hospital last week for likely seizures.  He was found to have a left fifth metatarsal wound with possible osteomyelitis.  He is currently being treated with antibiotics.  He underwent angiography which revealed an occluded superficial femoral artery with reconstitution of the above-knee popliteal artery.  He also had a high-grade profundus stenosis at the first major bifurcation.  He comes in today for revascularization.  Procedure:  The patient was identified in the holding area and taken to [place] [facility]  The patient was then placed supine on the table. general anesthesia was administered.  The patient was prepped and draped in the usual sterile fashion.  A time out was called and antibiotics were administered.  Ultrasound was used to map the course of the saphenous vein in the leg.  It measured about 4 mm throughout.  I began by making a longitudinal incision in the left groin.  Through this incision I exposed the common femoral  artery from the inguinal ligament down to the bifurcation.  The superficial femoral artery was also encircled with a vessel loop.  I then dissected out the profundofemoral artery to its major primary branch and encircled both branches with Vesseloops.  I then harvested the saphenous vein through skip incisions.  The vein was of excellent caliber measuring 4 mm throughout.  Side branches were ligated between silk ties.  In the distal above-knee incision I exposed the vein and then dissected out the artery from the same incision.  I exposed this at the level of the patella.  It was calcified however the plaque was not circumferential.  There were multiple soft areas which were adequate as a distal target.  I then dissected out a little additional length on the vein to make sure I had enough for a vein patch of the common femoral artery.  I then ligated the saphenous vein at the saphenofemoral junction with a 2-0 silk tie and distally with a 2-0 silk tie.  The vein was prepared on the back table.  It distended nicely.  It was marked for orientation.  I then created a tunnel between the above-knee incision and the groin incision with a long Gore tunneler.  The patient was then fully heparinized.  Heparin levels were checked with a CTs and redosed.  After the heparin circulated I occluded the common femoral artery with a vascular clamp and then the profundofemoral artery with 2 baby Gregory clamps.  A #11 blade was used to make an arteriotomy which was extended longitudinally with Potts scissors.  The arteriotomy was taken down to the profunda bifurcation.  I then  performed endarterectomy of the common femoral and profundofemoral artery and remove the high-grade stenosis within the profundofemoral artery.  Once all embolic debris was removed I made sure there was excellent inflow which there was.  I then used a portion of the saphenous vein and perform vein patch angioplasty of the common femoral and profundofemoral  artery using 6-0 Prolene.  Once this was completed the clamps were released and there was an excellent pulse within the common femoral artery and brisk Doppler signals in the profundofemoral artery.  The vein was then brought on the table.  I reoccluded the common femoral artery and used a #11 blade to make a opening in the patch which was extended longitudinally with Potts scissors.  The vein was spatulated to fit the size the arteriotomy.  It was placed in a reversed fashion.  A end-to-side anastomosis was then completed.  The clamps were then released and there was an excellent pulsatile flow through the vein graft.  The vein graft was then brought through the previously created tunnel making sure to maintain proper orientation.  A web roll was then placed in the mid thigh followed by tourniquet.  The leg was exsanguinated with an Esmarch and the tourniquet was taken to 250 mm of pressure.  I made an arteriotomy in the above-knee popliteal artery and extended this longitudinally with Potts scissors.  The leg was straightened and the vein graft was cut to the appropriate length and spatulated to fit the size of the arteriotomy.  A running anastomosis was created with 6-0 Prolene.  Prior to completion the tourniquet was let down and the appropriate flushing maneuvers were performed and the anastomosis was completed.  At this point the patient had a palpable pulse within the vein graft and brisk pedal Doppler signals.  The pedal Doppler signals were graft dependent.  Next, the heparin was reversed with protamine.  The wounds were copiously irrigated.  The vein harvest incisions were closed with 2 layers of 3-0 Vicryl.  The above-knee incision was closed by reapproximating the fascia with 2-0 Vicryl.  The vein harvest site was also closed with 2-0 Vicryl.  Subcutaneous tissue was closed with 3-0 Vicryl and the skin was closed with 4-0 Vicryl.  In the groin, the femoral sheath was reapproximated with 2-0 Vicryl.  The  subcutaneous tissues were then closed with multiple layers of Vicryl followed by 4-0 Vicryl subcuticular stitch.  Dermabond was placed on the incisions.  The patient was successfully extubated taken recovery in stable condition.  There was immediate complications.   Disposition: To PACU stable.   Theotis Burrow, M.D., [facility] [place] Office: [phone] Pager:  [phone]

## 2019-01-27 NOTE — Anesthesia Procedure Notes (Signed)
Arterial Line Insertion Start/End10/28/2020 12:53 PM, 01/27/2019 12:57 PM Performed by: Orlie Dakin, CRNA, CRNA  Patient location: Pre-op. Preanesthetic checklist: patient identified, IV checked, risks and benefits discussed, surgical consent, monitors and equipment checked, pre-op evaluation and timeout performed Lidocaine 1% used for infiltration and patient sedated Left, radial was placed Catheter size: 20 G Hand hygiene performed  and maximum sterile barriers used   Attempts: 2 Procedure performed without using ultrasound guided technique. Following insertion, dressing applied and Biopatch. Post procedure assessment: normal  Patient tolerated the procedure well with no immediate complications.

## 2019-01-27 NOTE — Anesthesia Postprocedure Evaluation (Signed)
Anesthesia Post Note  Patient: Danny Carpenter  Procedure(s) Performed: ENDARTERECTOMY LEFT FEMORAL ARTERY AND PROFUNDA  ARTERY (Left Groin) BYPASS GRAFT FEMORAL-POPLITEAL ARTERY (Left Leg Upper) Left Saphenous Vein Harvest (Left Leg Upper) Patch Angioplasty using Vein (Left Groin)     Patient location during evaluation: PACU Anesthesia Type: General Level of consciousness: sedated and patient cooperative Pain management: pain level controlled Vital Signs Assessment: post-procedure vital signs reviewed and stable Respiratory status: spontaneous breathing Cardiovascular status: stable Anesthetic complications: no    Last Vitals:  Vitals:   01/27/19 1935 01/27/19 2012  BP: (!) 157/69 (!) 169/71  Pulse: (!) 59 (!) 58  Resp: 14 13  Temp: 36.4 C (!) 36.4 C  SpO2: 94% 98%    Last Pain:  Vitals:   01/27/19 2012  TempSrc: Oral  PainSc:                  Nolon Nations

## 2019-01-27 NOTE — Transfer of Care (Signed)
Immediate Anesthesia Transfer of Care Note  Patient: Danny Carpenter  Procedure(s) Performed: ENDARTERECTOMY LEFT FEMORAL ARTERY AND PROFUNDA  ARTERY (Left Groin) BYPASS GRAFT FEMORAL-POPLITEAL ARTERY (Left Leg Upper) Left Saphenous Vein Harvest (Left Leg Upper) Patch Angioplasty using Vein (Left Groin)  Patient Location: PACU  Anesthesia Type:General  Level of Consciousness: awake  Airway & Oxygen Therapy: Patient Spontanous Breathing and Patient connected to face mask oxygen  Post-op Assessment: Report given to RN and Post -op Vital signs reviewed and stable  Post vital signs: Reviewed and stable  Last Vitals:  Vitals Value Taken Time  BP 157/72 01/27/19 1821  Temp    Pulse 66 01/27/19 1824  Resp 24 01/27/19 1824  SpO2 100 % 01/27/19 1824  Vitals shown include unvalidated device data.  Last Pain:  Vitals:   01/27/19 1201  TempSrc:   PainSc: 0-No pain      Patients Stated Pain Goal: 0 ([id number])  Complications: No apparent anesthesia complications

## 2019-01-27 NOTE — Progress Notes (Addendum)
Anesthesia Chart Review: SAME DAY WORK-UP  Case: [id number] Date/Time: 01/27/19 1345   Procedures:      ENDARTERECTOMY FEMORAL AND PROFUNDA (Left )     BYPASS GRAFT FEMORAL-POPLITEAL ARTERY (Left )   Anesthesia type: General   Pre-op diagnosis: left femoral occlusion   Location: [facility] OR ROOM 16 / [place] [facility]   Surgeon: Serafina Mitchell, MD      DISCUSSION: Patient is a 68 year old male scheduled for the above procedure.   He was admitted to [facility] 01/16/19-01/20/19 (transferred from [facility]) for altered mental status. He apparently began tremoring all over and had urinary incontinence. He became aphasic. Notes indicate that he was febrile with temperature of 104, tachycardic, and hypertensive (BP 219/91). Initial head CT negative and he was treated with IV tPA and transferred to [facility] for further management. LICA stenosis [id number] (by CTA, only mild at bifurcation per [place]). EEG showed no seizures, although neurology felt likely had generalized tonic clonic seizure at presentation for suspected CVA. He was started on Plavix and Keppra. Also with left foot diabetic infection with osteomyelitis and poor blood flow. Neurology Erlinda Hong, Cornelius Moras, MD), orthopedics Sharol Given, Beverely Low, MD), ID, and vascular surgery Trula Slade, V. Rock Nephew, MD) consulted. PICC line placed for vancomycin and ceftriaxone through 02/28/19. PV arteriogram done with above surgery recommended. Plavix placed on hold (neurology aware of Plavix hold and of surgery plans). Cardiology consulted for presurgical evaluation Harrington Challenger, Nevin Bloodgood, MD)--see below.   Other history includes former smoker (quit 04/01/09), COPD, HTN, PAD, hypercholesterolemia, DM2, GERD, OSA (no CPAP), AAA (43 mm 08/2018 CT). He reported a "light" heart attack ~ 15 years ago, but had not seen a cardiologist in several years since transitioning care to a local [facility] ([facility]). In review of 2018 cardiology notes by Danie Binder, MD ([place]), he has a history of chronic combined systolic and diastolic CHF with EF [id number] in 2018 with no significant perfusion defects on March 2018 nuclear stress test. Echo findings in 2015 suggestive of prior inferior-posterior infarct. Last visit with Dr. Jimmie Molly 09/17/16.   Preoperative Cardiology Evaluation by Dorris Carnes, MD as outlined on 01/19/19:  Cardiovascular evaluation: -According to the RCRI, regardless of whether the surgery is classed is high risk or not, his perioperative risk of a major cardiac event is 11% due to history of CHF, cerebrovascular disease and treatment with insulin. - DASI score is 18.95 for a functional capacity METS of 5.07. -His volume status is good by exam and he is having no ischemic symptoms. -EF this admission is normal with no wall motion abnormalities -In 2018, his EF was decreased on echo.  A Lexiscan nuclear stress test performed at that time showed no perfusion defects with an EF of 36%. -Dr. Harrington Challenger to review and advise if any further testing is indicated. -Although he is at high risk to have CAD with extensive PAD, he has no ischemic symptoms and his volume status is stable...  From a cardiac standpoint pt is at relatively low risk for major cardiac event with planned surgery    He probably has CAD but there does not appear to be anything flow limiting    Watch BP   FLuids     Will continue to follow in perioperative period.   OK to proceed with no furhter testing."  He is a same day work-up, so further evaluation by his anesthesia team on the day of surgery. A1c was significant elevated  during his recent admission (13.5% on 01/18/19, although oddly A1c 10.8% on 01/17/19). Daughter reported that since hospital discharge fasting CBGs have been running 150-220. He will get CBG on arrival. He is also getting COVID-19 testing at [facility] prior to arriving at [facility] on 01/27/19.    VS:  On 01/20/19 BP 127/67, HR 81   PROVIDERS: Melony Overly, MD is listed as PCP   LABS: Last labs as of 01/20/19 include: Lab Results  Component Value Date   WBC 5.3 01/20/2019   HGB 10.6 (L) 01/20/2019   HCT 32.3 (L) 01/20/2019   PLT 149 (L) 01/20/2019   GLUCOSE 172 (H) 01/20/2019   CHOL 131 01/17/2019   TRIG 130 01/17/2019   HDL 35 (L) 01/17/2019   LDLCALC 70 01/17/2019   NA 136 01/20/2019   K 3.8 01/20/2019   CL 101 01/20/2019   CREATININE 0.94 01/20/2019   BUN 10 01/20/2019   CO2 26 01/20/2019   HGBA1C 13.5 (H) 01/18/2019    OTHER: EEG 01/17/19: IMPRESSION: This study is suggestive of non specific cortical dysfunction in left temporal region. No seizures or epileptiform discharges were seen throughout the recording.   IMAGES: 1V CXR 01/17/19: IMPRESSION: Improved lung volumes and aeration from radiograph yesterday with residual atelectasis or scarring at the right lung base. Aortic Atherosclerosis (ICD10-I70.0).  MRI Brain 01/17/19: IMPRESSION: 1. No acute intracranial abnormality. 2. Moderate generalized atrophy. 3. Remote lacunar infarcts of the left cerebellum. 4. Abnormal signal in the distal right vertebral artery, V4 segment, likely related to slow flow or occlusion. 5. Bilateral mastoid effusions.  CT chest/abd/pelvis 6/29/920 [facility]; report in PACS): IMPRESSION: 1. Coronary artery calcification and Aortic Atherosclerosis. 2. Stable fusiform aneurysm of the infrarenal abdominal aortic aneurysm to 43 mm. Recommend follow-up by ultrasound in 1 year.    EKG: Requested and received previous 01/16/19 EKG from [facility] as 01/17/19 EKG shows limb lead reversal.   EKG 01/17/19:  Right and left arm electrode reversal, interpretation assumes no reversal Sinus or ectopic atrial rhythm Multiple ventricular premature complexes Prolonged PR interval Probable lateral infarct, age indeterminate Baseline wander in lead(s) V4 Confirmed by Merrily Pew [phone]) on 01/17/2019 1:29:44 AM  EKG  01/16/19 [facility]):  Sinus tachycardia at 112 bpm First degree AV block Left axis deviation Left ventricular hypertrophy with repolarization abnormality   CV: Abdominal Aortogram with BLE runoff 01/19/19: Impression: #1  Abdominal aortic aneurysm.  Diameter cannot be determined from angiographic evaluation #2  Left superficial femoral artery flush occlusion and 90% left popliteal stenosis with reconstitution of the popliteal artery and two-vessel runoff #3  Diffusely diseased right superficial femoral artery with multiple areas of hemodynamically significant stenosis #4  Patient will be scheduled for a left femoral and profunda endarterectomy and left femoral-popliteal bypass graft.   Echo 01/17/19: IMPRESSIONS  1. Left ventricular ejection fraction, by visual estimation, is 55 to 60%. The left ventricle has normal function. Normal left ventricular size. There is no left ventricular hypertrophy.  2. Frequent PVCs. No source of cardiac embolism detected.  3. Definity contrast agent was given IV to delineate the left ventricular endocardial borders.  4. Left ventricular diastolic Doppler parameters are consistent with impaired relaxation pattern of LV diastolic filling.  5. No LV thrombus seen contrast imaging.  6. Global right ventricle has normal systolic function.The right ventricular size is normal. No increase in right ventricular wall thickness.  7. Left atrial size was mildly dilated.  8. Right atrial size was normal.  9. Mild aortic valve annular calcification. 10. The mitral valve is degenerative. Trace mitral valve regurgitation. 11. The tricuspid valve is grossly normal. Tricuspid valve regurgitation is trivial. 12. The aortic valve is tricuspid Aortic valve regurgitation was not visualized by color flow Doppler. Structurally normal aortic valve, with no evidence of sclerosis or stenosis. 13. There is Mild thickening of the aortic valve. 14. The pulmonic valve was  grossly normal. Pulmonic valve regurgitation is trivial by color flow Doppler. 15. Mild plaque invoving the ascending aorta. 16. TR signal is inadequate for assessing pulmonary artery systolic pressure. 17. The inferior vena cava is normal in size with greater than 50% respiratory variability, suggesting right atrial pressure of 3 mmHg. (Comparison: 04/20/13: LVEF 45-50%, basal and mid inferoposterior segmental thinning and akinesis; 01/11/14: LVEF 50-55%, basal inferoposterior akinesis)   Carotid US 01/17/19: Summary: Right Carotid: Velocities in the right ICA are consistent with a 1-39% stenosis. Left Carotid: Velocities in the left ICA are consistent with a 1-39% stenosis. Vertebrals:  Bilateral vertebral arteries demonstrate antegrade flow. Subclavians: Normal flow hemodynamics were seen in bilateral subclavian arteries.   CTA neck 01/16/19 [facility], report in PACS): IMPRESSION: 1. Negative for large vessel occlusion, but positive for: - near occlusion of the right vertebral artery V4 segment due to high grade stenosis from calcified plague. - high grade Left ICA origin stenosis due to bulky calcified plaque numerically estimated at 74%. - moderate distal left vertebral artery stenosis. - up to moderate grade right ICA siphon stenosis at the anterior genu. 2. Widespread atherosclerosis elsewhere in the head and neck but no other hemodynamically significant stenosis. 3. Aortic Atherosclerosis.    Nuclear stress test 06/11/16: Final report not viewable in [facility], but according to 01/19/19 cardiology consult with Dr. Harrington Challenger, "Perf Stress RH 05/2016: Dilated LV, diffuse hypo, EF 36%, but no significant perf defect, No stress induced hypoperfusion". - (Previous NST 01/03/16 at [facility] showed small regions of decreased radiotracer uptake at rest in the apical anteroseptal and inferior wall may represent areas of prior infarct/scarring, but evaluation for reversible  ischemia and cardiac wall motion could not be obtained secondary to patient declining the stress portion of the exam and stress echo was felt contraindicated given AAA history.)  Cardiac event monitor 02/26/16-03/17/16 [facility]): Indication: syncope Baseline Rhythm: Sinus Rhythm with PVC's 81 BPM Rhythm Findings: 1. Ventricular: occasional PVCs 2. Supraventricular: rare PACs 3. Bradyarrhythmias and Pauses: None Symptoms reported: None CONCLUSIONS: 1. Stable event monitor.  2. Mild arrhythmia as described 3. Symptoms were not reported  4. Symptoms were not correlated with arrhythmias    Past Medical History:  Diagnosis Date  . AAA (abdominal aortic aneurysm) ([place])   . Anxiety   . Arthritis   . CHF (congestive heart failure) ([place])   . COPD (chronic obstructive pulmonary disease) ([place])   . Depression   . DM2 (diabetes mellitus, type 2) ([place])   . GERD (gastroesophageal reflux disease)   . HTN (hypertension)   . Hypercholesteremia   . Myocardial infarction ([place])    "light" heart attack 15 years ago (per pt 01/26/19)  . Peripheral vascular disease ([place])   . Seizure [facility])    one time in Oct. 2020  . Sleep apnea    no cpap    Past Surgical History:  Procedure Laterality Date  . ABDOMINAL AORTOGRAM W/LOWER EXTREMITY N/A 01/19/2019   Procedure: ABDOMINAL AORTOGRAM W/LOWER EXTREMITY;  Surgeon: Serafina Mitchell, MD;  Location: [place] [facility];  Service:  Cardiovascular;  Laterality: N/A;  . APPENDECTOMY    . FINGER AMPUTATION     Left second and third fingers, work accident  . KNEE SURGERY Left   . LOWER EXTREMITY ANGIOGRAM  05/2008   Left SFA atherectomy with diamondback 2.25 utilizing distal protection device NAV6 placed in the left popliteal artery. Angioplasty of the left SFA with ATV balloon 6 x 40   . SHOULDER SURGERY Right     MEDICATIONS: No current facility-administered medications for this encounter.    Marland Kitchen amLODipine (NORVASC) 10 MG tablet  . aspirin EC  81 MG EC tablet  . atorvastatin (LIPITOR) 40 MG tablet  . cefTRIAXone (ROCEPHIN) IVPB  . diflunisal (DOLOBID) 500 MG TABS tablet  . DULoxetine (CYMBALTA) 60 MG capsule  . fenofibrate 160 MG tablet  . Gabapentin, Once-Daily, (GRALISE) 600 MG TABS  . insulin aspart (NOVOLOG FLEXPEN) 100 UNIT/ML FlexPen  . Insulin Degludec-Liraglutide (XULTOPHY) 100-3.6 UNIT-MG/ML SOPN  . levETIRAcetam (KEPPRA) 500 MG tablet  . lisinopril (ZESTRIL) 40 MG tablet  . morphine (MS CONTIN) 15 MG 12 hr tablet  . mupirocin ointment (BACTROBAN) 2 %  . pantoprazole (PROTONIX) 40 MG tablet  . Probiotic Product (ALIGN) 4 MG CAPS  . sodium chloride 1 g tablet  . spironolactone (ALDACTONE) 25 MG tablet  . tamsulosin (FLOMAX) 0.4 MG CAPS capsule  . traZODone (DESYREL) 100 MG tablet  . vancomycin IVPB     Myra Gianotti, PA-C Surgical Short Stay/Anesthesiology [facility] Phone [phone] [facility] Phone [phone] 01/27/2019 9:53 AM

## 2019-01-27 NOTE — Anesthesia Preprocedure Evaluation (Addendum)
Anesthesia Evaluation  Patient identified by MRN, date of birth, ID band Patient awake    Reviewed: Allergy & Precautions, NPO status , Patient's Chart, lab work & pertinent test results  Airway Mallampati: II  TM Distance: >3 FB Neck ROM: Full    Dental  (+) Poor Dentition, Dental Advisory Given,    Pulmonary former smoker,    breath sounds clear to auscultation       Cardiovascular hypertension,  Rhythm:Regular Rate:Normal     Neuro/Psych    GI/Hepatic   Endo/Other  diabetes  Renal/GU      Musculoskeletal   Abdominal (+) + obese,   Peds  Hematology   Anesthesia Other Findings   Reproductive/Obstetrics                             Anesthesia Physical Anesthesia Plan  ASA: III  Anesthesia Plan: General   Post-op Pain Management:    Induction: Intravenous  PONV Risk Score and Plan: Ondansetron  Airway Management Planned: Oral ETT  Additional Equipment: Arterial line  Intra-op Plan:   Post-operative Plan: Extubation in OR  Informed Consent: I have reviewed the patients History and Physical, chart, labs and discussed the procedure including the risks, benefits and alternatives for the proposed anesthesia with the patient or authorized representative who has indicated his/her understanding and acceptance.     Dental advisory given  Plan Discussed with: CRNA and Anesthesiologist  Anesthesia Plan Comments:         Anesthesia Quick Evaluation

## 2019-01-27 NOTE — Progress Notes (Signed)
Dr. Conrad [place] stated "ok to use PICC line for surgery" today.

## 2019-01-27 NOTE — Anesthesia Procedure Notes (Signed)
Central Venous Catheter Insertion Performed by: Roberts Gaudy, MD, anesthesiologist Start/End10/28/2020 1:30 PM, 01/27/2019 1:40 PM Patient location: Pre-op. Preanesthetic checklist: patient identified, IV checked, site marked, risks and benefits discussed, surgical consent, monitors and equipment checked, pre-op evaluation, timeout performed and anesthesia consent Lidocaine 1% used for infiltration and patient sedated Hand hygiene performed  and maximum sterile barriers used  Catheter size: 8 Fr Total catheter length 16. Central line was placed.Double lumen Procedure performed using ultrasound guided technique. Ultrasound Notes:image(s) printed for medical record Attempts: 1 Following insertion, dressing applied and line sutured. Post procedure assessment: blood return through all ports  Patient tolerated the procedure well with no immediate complications.

## 2019-01-27 NOTE — Anesthesia Procedure Notes (Signed)
Procedure Name: Intubation Date/Time: 01/27/2019 1:21 PM Performed by: Orlie Dakin, CRNA Pre-anesthesia Checklist: Patient identified, Emergency Drugs available, Suction available and Patient being monitored Patient Re-evaluated:Patient Re-evaluated prior to induction Oxygen Delivery Method: Circle system utilized Preoxygenation: Pre-oxygenation with 100% oxygen Induction Type: IV induction Ventilation: Mask ventilation without difficulty and Oral airway inserted - appropriate to patient size Laryngoscope Size: Sabra Heck and 3 Grade View: Grade I Tube type: Oral Tube size: 7.5 mm Number of attempts: 1 Airway Equipment and Method: Stylet Placement Confirmation: ETT inserted through vocal cords under direct vision,  positive ETCO2 and breath sounds checked- equal and bilateral Secured at: 22 cm Tube secured with: Tape Dental Injury: Teeth and Oropharynx as per pre-operative assessment

## 2019-01-28 ENCOUNTER — Inpatient Hospital Stay: Payer: Self-pay

## 2019-01-28 ENCOUNTER — Encounter (HOSPITAL_COMMUNITY): Payer: Self-pay | Admitting: Surgery

## 2019-01-28 ENCOUNTER — Inpatient Hospital Stay (HOSPITAL_COMMUNITY): Payer: No Typology Code available for payment source

## 2019-01-28 DIAGNOSIS — L0390 Cellulitis, unspecified: Secondary | ICD-10-CM

## 2019-01-28 LAB — BASIC METABOLIC PANEL
Anion gap: 9 (ref 5–15)
BUN: 18 mg/dL (ref 8–23)
CO2: 25 mmol/L (ref 22–32)
Calcium: 8.4 mg/dL — ABNORMAL LOW (ref 8.9–10.3)
Chloride: 97 mmol/L — ABNORMAL LOW (ref 98–111)
Creatinine, Ser: 1.06 mg/dL (ref 0.61–1.24)
GFR calc Af Amer: >60 mL/min (ref >60)
GFR calc non Af Amer: >60 mL/min (ref >60)
Glucose, Bld: 222 mg/dL — ABNORMAL HIGH (ref 70–99)
Potassium: 4.4 mmol/L (ref 3.5–5.1)
Sodium: 131 mmol/L — ABNORMAL LOW (ref 135–145)

## 2019-01-28 LAB — POCT ACTIVATED CLOTTING TIME
Activated Clotting Time: 186 seconds
Activated Clotting Time: 202 seconds

## 2019-01-28 LAB — POCT I-STAT, CHEM 8
BUN: 17 mg/dL (ref 8–23)
Calcium, Ion: 1.26 mmol/L (ref 1.15–1.40)
Chloride: 95 mmol/L — ABNORMAL LOW (ref 98–111)
Creatinine, Ser: 0.9 mg/dL (ref 0.61–1.24)
Glucose, Bld: 279 mg/dL — ABNORMAL HIGH (ref 70–99)
HCT: 32 % — ABNORMAL LOW (ref 39.0–52.0)
Hemoglobin: 10.9 g/dL — ABNORMAL LOW (ref 13.0–17.0)
Potassium: 4.3 mmol/L (ref 3.5–5.1)
Sodium: 133 mmol/L — ABNORMAL LOW (ref 135–145)
TCO2: 27 mmol/L (ref 22–32)

## 2019-01-28 LAB — CBC
HCT: 32 % — ABNORMAL LOW (ref 39.0–52.0)
Hemoglobin: 10.1 g/dL — ABNORMAL LOW (ref 13.0–17.0)
MCH: 29.1 pg (ref 26.0–34.0)
MCHC: 31.6 g/dL (ref 30.0–36.0)
MCV: 92.2 fL (ref 80.0–100.0)
Platelets: 257 10*3/uL (ref 150–400)
RBC: 3.47 MIL/uL — ABNORMAL LOW (ref 4.22–5.81)
RDW: 14.1 % (ref 11.5–15.5)
WBC: 10.8 10*3/uL — ABNORMAL HIGH (ref 4.0–10.5)
nRBC: 0 % (ref 0.0–0.2)

## 2019-01-28 LAB — GLUCOSE, CAPILLARY
Glucose-Capillary: 232 mg/dL — ABNORMAL HIGH (ref 70–99)
Glucose-Capillary: 215 mg/dL — ABNORMAL HIGH (ref 70–99)
Glucose-Capillary: 217 mg/dL — ABNORMAL HIGH (ref 70–99)
Glucose-Capillary: 358 mg/dL — ABNORMAL HIGH (ref 70–99)
Glucose-Capillary: 208 mg/dL — ABNORMAL HIGH (ref 70–99)

## 2019-01-28 MED ORDER — CHLORHEXIDINE GLUCONATE CLOTH 2 % EX PADS
6.0000 | MEDICATED_PAD | Freq: Every day | CUTANEOUS | Status: DC
  Administered 2019-01-28: 6 via TOPICAL

## 2019-01-28 NOTE — Progress Notes (Signed)
    Patients PICC line came out accidentally we will order new PICC line placement. He is being followed by ID with plans as follows: Blood cultures from 10/18 have remained without growth to date and plan is for treatment of culture negative osteomyelitis of the left foot. PICC line placed. Discussed plan with Home Health and will arrange for vancomycin and ceftriaxone for 6 weeks with end date planned for 02/28/19.  Roxy Horseman PA-C

## 2019-01-28 NOTE — Progress Notes (Addendum)
[facility]  Subjective  - Pain is his biggest issue.   Objective (!) 120/56 70 98.5 F (36.9 C) (Oral) (!) 7 99%  Intake/Output Summary (Last 24 hours) at 01/28/2019 0715 Last data filed at 01/28/2019 [id number] Gross per 24 hour  Intake 2363.4 ml  Output 730 ml  Net 1633.4 ml    Left LE incisions healing well, groin soft without hematoma Doppler signal DP/PT/Peroneal.  PT brisk left LE Lungs non labored breathing  Assessment/Planning: POD # 1  #1: Left common femoral and profundofemoral endarterectomy with vein patch angioplasty                         #2: Left femoral to above-knee popliteal artery bypass graft with reversed ipsilateral translocated saphenous vein  He has not voided yet foley was D/C this am Pending mobility for discharge and pain control  Roxy Horseman 01/28/2019 7:15 AM --  Laboratory Lab Results: Recent Labs    01/27/19 2216 01/28/19 0315  WBC 14.6* 10.8*  HGB 11.1* 10.1*  HCT 33.5* 32.0*  PLT 271 257   BMET Recent Labs    01/27/19 1200 01/27/19 2216 01/28/19 0315  NA 131*  --  131*  K 4.3  --  4.4  CL 92*  --  97*  CO2 26  --  25  GLUCOSE 299*  --  222*  BUN 18  --  18  CREATININE 1.27* 0.93 1.06  CALCIUM 9.4  --  8.4*    COAG Lab Results  Component Value Date   INR 1.0 01/27/2019   No results found for: PTT   I agree with the above.  I have seen and examined the patient.  POD#1, s/p left femoral endarterectomy and VPA with left femoral above knee popliteal bypass with reversed GSV  Incisions c/d/i Brisk PT doppler signal Continue IV abx per ID Restart Plavix tomorrow Continue Lipitor statin therapy  [name]

## 2019-01-28 NOTE — Discharge Instructions (Signed)
 [facility]  Discharge instructions  Lower Extremity Bypass Surgery  Please refer to the following instruction for your post-procedure care. Your surgeon or physician assistant will discuss any changes with you.  Activity  You are encouraged to walk as much as you can. You can slowly return to normal activities during the month after your surgery. Avoid strenuous activity and heavy lifting until your doctor tells you it's OK. Avoid activities such as vacuuming or swinging a golf club. Do not drive until your doctor give the OK and you are no longer taking prescription pain medications. It is also normal to have difficulty with sleep habits, eating and bowel movement after surgery. These will go away with time.  Bathing/Showering  You may shower after you go home. Do not soak in a bathtub, hot tub, or swim until the incision heals completely.  Incision Care  Clean your incision with mild soap and water. Shower every day. Pat the area dry with a clean towel. You do not need a bandage unless otherwise instructed. Do not apply any ointments or creams to your incision. If you have open wounds you will be instructed how to care for them or a visiting nurse may be arranged for you. If you have staples or sutures along your incision they will be removed at your post-op appointment. You may have skin glue on your incision. Do not peel it off. It will come off on its own in about one week. If you have a great deal of moisture in your groin, use a gauze help keep this area dry.  Diet  Resume your normal diet. There are no special food restrictions following this procedure. A low fat/ low cholesterol diet is recommended for all patients with vascular disease. In order to heal from your surgery, it is CRITICAL to get adequate nutrition. Your body requires vitamins, minerals, and protein. Vegetables are the best source of vitamins and minerals. Vegetables also provide the  perfect balance of protein. Processed food has little nutritional value, so try to avoid this.  Medications  Resume taking all your medications unless your doctor or nurse practitioner tells you not to. If your incision is causing pain, you may take over-the-counter pain relievers such as acetaminophen (Tylenol). If you were prescribed a stronger pain medication, please aware these medication can cause nausea and constipation. Prevent nausea by taking the medication with a snack or meal. Avoid constipation by drinking plenty of fluids and eating foods with high amount of fiber, such as fruits, vegetables, and grains. Take Colase 100 mg (an over-the-counter stool softener) twice a day as needed for constipation. Do not take Tylenol if you are taking prescription pain medications.  Follow Up  Our office will schedule a follow up appointment 2-3 weeks following discharge.  Please call us immediately for any of the following conditions  Severe or worsening pain in your legs or feet while at rest or while walking Increase pain, redness, warmth, or drainage (pus) from your incision site(s) Fever of 101 degree or higher The swelling in your leg with the bypass suddenly worsens and becomes more painful than when you were in the hospital If you have been instructed to feel your graft pulse then you should do so every day. If you can no longer feel this pulse, call the office immediately. Not all patients are given this instruction.  Leg swelling is common after leg bypass surgery.  The swelling should improve over a few months   following surgery. To improve the swelling, you may elevate your legs above the level of your heart while you are sitting or resting. Your surgeon or physician assistant may ask you to apply an ACE wrap or wear compression (TED) stockings to help to reduce swelling.  Reduce your risk of vascular disease  Stop smoking. If you would like help call [facility] at 1-800-QUIT-NOW  (1-800-784-8669) or [place] at [phone].  Manage your cholesterol Maintain a desired weight Control your diabetes weight Control your diabetes Keep your blood pressure down  If you have any questions, please call the office at [phone]   

## 2019-01-28 NOTE — Progress Notes (Addendum)
ABI's have been completed. Preliminary results can be found in CV Proc through chart review.  Results were given to the patient's nurse, Lauren.  01/28/19 12:07 PM Danny Carpenter RVT

## 2019-01-28 NOTE — Progress Notes (Signed)
Called to pt's room for IV pump beeping. Upon assessment, pt was laying in the bed and pt's PICC line was in the floor. No bleeding present. Will notify PA.

## 2019-01-28 NOTE — Addendum Note (Signed)
Addendum  created 01/28/19 0853 by Orlie Dakin, CRNA   Intraprocedure Event edited

## 2019-01-28 NOTE — Evaluation (Signed)
Occupational Therapy Evaluation and Discharge Summary Patient Details Name: Danny Carpenter MRN: [id number] DOB: 1950/04/10 Today's Date: 01/28/2019    History of Present Illness Pt is a 68 yo male admitted with L fem/pop bypass.  Pt in last week for transient episode of global aphasia for which he recovered. Pt fever then most likely related to issues in LLE.      Clinical Impression   Pt admitted with the above diagnosis and overall is doing very well. Pt requires assist to get to LLE to donn sock and shoe and supervision when on feet due to recent surgery.  Pt has assist at home from his children and goes to adult day care with his wife during the day.  Pt is not in need of further OT at this time.  ADL techniques reviewed and pt demonstrated understanding.      Follow Up Recommendations  Supervision/Assistance - 24 hour;No OT follow up    Equipment Recommendations  None recommended by OT    Recommendations for Other Services       Precautions / Restrictions Precautions Precautions: Fall Restrictions Weight Bearing Restrictions: No      Mobility Bed Mobility               General bed mobility comments: pt sitting in recliner upon arrival  Transfers Overall transfer level: Needs assistance Equipment used: Rolling walker (2 wheeled) Transfers: Sit to/from [facility] Sit to Stand: Supervision Stand pivot transfers: Supervision       General transfer comment: Pt  had one hand on chair and one hand on walker and pushed up on walker some to stand but did not require physical assist.    Balance Overall balance assessment: Needs assistance Sitting-balance support: Feet supported Sitting balance-Leahy Scale: Normal     Standing balance support: Bilateral upper extremity supported;During functional activity Standing balance-Leahy Scale: Fair Standing balance comment: Pt requires walker at this time for safety with recent surgery                            ADL either performed or assessed with clinical judgement   ADL Overall ADL's : Needs assistance/impaired Eating/Feeding: Independent;Sitting   Grooming: Wash/dry hands;Wash/dry face;Oral care;Set up;Standing   Upper Body Bathing: Supervision/ safety;Sitting   Lower Body Bathing: Minimal assistance;Sit to/from stand Lower Body Bathing Details (indicate cue type and reason): min assist to reach LLE due to pain at incision sites Upper Body Dressing : Supervision/safety;Sitting   Lower Body Dressing: Minimal assistance;Sit to/from stand Lower Body Dressing Details (indicate cue type and reason): min assist to donn L sock due to incisional pain. Toilet Transfer: Supervision/safety;Ambulation;Regular Toilet;RW Toilet Transfer Details (indicate cue type and reason): supervision for safety Toileting- Clothing Manipulation and Hygiene: Supervision/safety;Sit to/from stand   Tub/ Shower Transfer: Walk-in shower;Supervision/safety;Ambulation   Functional mobility during ADLs: Supervision/safety;Rolling walker General ADL Comments: Pt required assist with lines.  Pt did push up with one hand on walker.  No physical assist needed.     Vision Baseline Vision/History: No visual deficits Patient Visual Report: No change from baseline Vision Assessment?: No apparent visual deficits     Perception Perception Perception Tested?: No   Praxis Praxis Praxis tested?: Within functional limits    Pertinent Vitals/Pain Pain Assessment: 0-10 Pain Score: 9  Pain Location: L leg at incision Pain Descriptors / Indicators: Discomfort Pain Intervention(s): Repositioned;Monitored during session;Limited activity within patient's tolerance;RN gave pain meds during session  Hand Dominance Right   Extremity/Trunk Assessment Upper Extremity Assessment Upper Extremity Assessment: Overall WFL for tasks assessed(Pt with amputations on LUE)   Lower Extremity Assessment Lower Extremity  Assessment: Defer to PT evaluation   Cervical / Trunk Assessment Cervical / Trunk Assessment: Normal   Communication Communication Communication: No difficulties   Cognition Arousal/Alertness: Awake/alert Behavior During Therapy: WFL for tasks assessed/performed Overall Cognitive Status: Within Functional Limits for tasks assessed                                 General Comments: Cognition appears grossly intact.   General Comments  Pt overall doing very well with adls.  Most limited by incisional pain in LLE    Exercises     Shoulder Instructions      Home Living Family/patient expects to be discharged to:: Private residence Living Arrangements: Spouse/significant other Available Help at Discharge: Family;Available 24 hours/day Type of Home: House Home Access: Ramped entrance     Home Layout: One level     Bathroom Shower/Tub: Occupational psychologist: Standard     Home Equipment: Environmental consultant - 4 wheels;Cane - single point;Bedside commode;Shower seat;Grab bars - toilet;Hand held shower head   Additional Comments: Goes to [employer] adult program 3x/wk for wound care, therapy and activities  Lives With: Spouse    Prior Functioning/Environment Level of Independence: Independent with assistive device(s)        Comments: Pt reports that he had a RW that he is supposed to use for his foot, however he does not regularly use it. Pt has previously been driving to [facility] adult day program, and independent for ADLs and IADLs        OT Problem List:        OT Treatment/Interventions:      OT Goals(Current goals can be found in the care plan section) Acute Rehab OT Goals Patient Stated Goal: return home OT Goal Formulation: All assessment and education complete, DC therapy  OT Frequency:     Barriers to D/C:            Co-evaluation              AM-PAC OT "6 Clicks" Daily Activity     Outcome Measure Help from another person eating  meals?: None Help from another person taking care of personal grooming?: None Help from another person toileting, which includes using toliet, bedpan, or urinal?: None Help from another person bathing (including washing, rinsing, drying)?: A Little Help from another person to put on and taking off regular upper body clothing?: None Help from another person to put on and taking off regular lower body clothing?: A Little 6 Click Score: 22   End of Session Equipment Utilized During Treatment: Gait belt;Rolling walker Nurse Communication: Mobility status;Patient requests pain meds  Activity Tolerance: Patient tolerated treatment well Patient left: in chair;with call bell/phone within reach;with chair alarm set  OT Visit Diagnosis: Unsteadiness on feet (R26.81)                Time: [id number] OT Time Calculation (min): 15 min Charges:  OT General Charges $OT Visit: 1 Visit OT Evaluation $OT Eval Low Complexity: 1 Low  Glenford Peers 01/28/2019, 9:11 AM

## 2019-01-28 NOTE — Progress Notes (Signed)
Lauren RN aware of order to d/c CVC.

## 2019-01-28 NOTE — Evaluation (Signed)
Physical Therapy Evaluation & Discharge Patient Details Name: Danny Carpenter MRN: [id number] DOB: [birth year] Today's Date: 01/28/2019   History of Present Illness  Pt is a 68 y.o. male admitted 01/27/19 s/p L femoral endarterectomy and femoral to popliteal bypass graft 10/28. PMH includes PVD, HTN, DM2, COPD, CHF, AAA, seizure, OSA, arthritis, anxiety.    Clinical Impression  Patient evaluated by Physical Therapy with no further acute PT needs identified. PTA, pt mod indep with intermittent use of rollator or RW; lives with supportive family. Today, pt ambulating with RW at supervision-level; reviewed LE therex/stretching and encouraged continued ambulation with assist from nursing staff for lines. All education has been completed and the patient has no further questions. Acute PT is signing off. Thank you for this referral.    Follow Up Recommendations Outpatient PT(receiving services [facility])    Equipment Recommendations  Rolling walker with 5" wheels    Recommendations for Other Services       Precautions / Restrictions Precautions Precautions: Fall Restrictions Weight Bearing Restrictions: No      Mobility  Bed Mobility Overal bed mobility: Independent             General bed mobility comments: pt sitting in recliner upon arrival  Transfers Overall transfer level: Modified independent Equipment used: Rolling walker (2 wheeled) Transfers: Sit to/from Stand Sit to Stand: Supervision Stand pivot transfers: Supervision       General transfer comment: Pt  had one hand on chair and one hand on walker and pushed up on walker some to stand but did not require physical assist.  Ambulation/Gait Ambulation/Gait assistance: Supervision Gait Distance (Feet): 150 Feet Assistive device: Rolling walker (2 wheeled) Gait Pattern/deviations: Step-through pattern;Decreased stride length;Antalgic Gait velocity: Decreased   General Gait Details: Slow, steady gait with RW;  supervision for safety/lines  Stairs            Wheelchair Mobility    Modified Rankin (Stroke Patients Only)       Balance Overall balance assessment: Needs assistance Sitting-balance support: Feet supported Sitting balance-Leahy Scale: Normal     Standing balance support: Bilateral upper extremity supported;During functional activity Standing balance-Leahy Scale: Fair Standing balance comment: Can static stand and take steps without UE support; dyanmic stability improved with UE support due to LLE pain                             Pertinent Vitals/Pain Pain Assessment: Faces Pain Score: 9  Faces Pain Scale: Hurts a little bit Pain Location: L leg at incision Pain Descriptors / Indicators: Discomfort Pain Intervention(s): Monitored during session;Repositioned    Home Living Family/patient expects to be discharged to:: Private residence Living Arrangements: Spouse/significant other Available Help at Discharge: Family;Available 24 hours/day Type of Home: House Home Access: Ramped entrance     Home Layout: One level Home Equipment: Walker - 4 wheels;Cane - single point;Bedside commode;Shower seat;Grab bars - toilet;Hand held shower head Additional Comments: Goes to [employer] [facility] adult program 3x/wk for wound care, therapy and activities    Prior Function Level of Independence: Independent with assistive device(s)         Comments: Pt reports that he had a RW that he is supposed to use for his foot, however he does not regularly use it. Pt has previously been driving to [facility] adult day program, and independent for ADLs and IADLs     Hand Dominance   Dominant Hand: Right    Extremity/Trunk Assessment  Upper Extremity Assessment Upper Extremity Assessment: Overall WFL for tasks assessed    Lower Extremity Assessment Lower Extremity Assessment: Overall WFL for tasks assessed(stiffness from LLE incisions)    Cervical / Trunk  Assessment Cervical / Trunk Assessment: Normal  Communication   Communication: No difficulties  Cognition Arousal/Alertness: Awake/alert Behavior During Therapy: WFL for tasks assessed/performed Overall Cognitive Status: Within Functional Limits for tasks assessed                                 General Comments: Cognition appears grossly intact.      General Comments General comments (skin integrity, edema, etc.): Reviewed LE therex/stretching    Exercises     Assessment/Plan    PT Assessment Patent does not need any further PT services  PT Problem List Decreased mobility;Decreased balance;Pain       PT Treatment Interventions      PT Goals (Current goals can be found in the Care Plan section)  Acute Rehab PT Goals Patient Stated Goal: return home PT Goal Formulation: All assessment and education complete, DC therapy    Frequency     Barriers to discharge        Co-evaluation               AM-PAC PT "6 Clicks" Mobility  Outcome Measure Help needed turning from your back to your side while in a flat bed without using bedrails?: None Help needed moving from lying on your back to sitting on the side of a flat bed without using bedrails?: None Help needed moving to and from a bed to a chair (including a wheelchair)?: None Help needed standing up from a chair using your arms (e.g., wheelchair or bedside chair)?: None Help needed to walk in hospital room?: A Little Help needed climbing 3-5 steps with a railing? : A Little 6 Click Score: 22    End of Session Equipment Utilized During Treatment: Gait belt Activity Tolerance: Patient tolerated treatment well Patient left: in chair;with call bell/phone within reach Nurse Communication: Mobility status PT Visit Diagnosis: Other abnormalities of gait and mobility (R26.89)    Time: [id number] PT Time Calculation (min) (ACUTE ONLY): 23 min   Charges:   PT Evaluation $PT Eval Low Complexity: 1  Low PT Treatments $Gait Training: 8-22 mins       Mabeline Caras, PT, DPT Acute Rehabilitation Services  Pager [phone] Office [place] 01/28/2019, 9:44 AM

## 2019-01-28 NOTE — Progress Notes (Signed)
Pt ambulated 240 ft in hallway using rollator. Pt tolerated well. Returned to chair with call light within reach.

## 2019-01-29 ENCOUNTER — Inpatient Hospital Stay (HOSPITAL_COMMUNITY): Payer: No Typology Code available for payment source

## 2019-01-29 DIAGNOSIS — Z9889 Other specified postprocedural states: Secondary | ICD-10-CM

## 2019-01-29 LAB — GLUCOSE, CAPILLARY: Glucose-Capillary: 193 mg/dL — ABNORMAL HIGH (ref 70–99)

## 2019-01-29 MED ORDER — SODIUM CHLORIDE 0.9% FLUSH: 10.0000 mL | Freq: Two times a day (BID) | INTRAVENOUS | Status: DC

## 2019-01-29 MED ORDER — SODIUM CHLORIDE 0.9% FLUSH: 10.0000 mL | INTRAVENOUS | Status: DC | PRN

## 2019-01-29 NOTE — TOC Transition Note (Signed)
Transition of Care [facility]) - CM/SW Discharge Note Marvetta Gibbons RN, BSN Transitions of Care Unit 4E- RN Case Manager [phone]   Patient Details  Name: Antwaine Putnam MRN: [id number] Date of Birth: 09/07/1950  [facility]) CM/SW Contact:  Dawayne Patricia, RN Phone Number: 01/29/2019, 11:31 AM   Clinical Narrative:    Pt stable for transition home today s/p bypass. Pt is a [facility] program participate with [place]- contact there is Jinny Blossom CSW- # is [phone], [phone])- pt is currently getting 6 wks of IV abx through [facility] with end date 11/29- call made to [facility] who will continue services as previously arranged- new PICC line as been placed this AM for IV abx needs. Pt will also have any other services needed provided by [facility]. Will fax d/c summary to [facility] when available- son here to transport home.    Final next level of care: [place] [facility]) Barriers to Discharge: No Barriers Identified   Patient Goals and CMS Choice Patient states their goals for this hospitalization and ongoing recovery are:: to return home CMS Medicare.gov Compare Post Acute Care list provided to:: Patient Choice offered to / list presented to : Patient  Discharge Placement               Home with [facility]        Discharge Plan and Services   Discharge Planning Services: CM Consult Post Acute Care Choice: Home Health, Resumption of Svcs/PTA Provider          DME Arranged: N/A DME Agency: NA       HH Arranged: RN, IV Antibiotics HH Agency: Other - See comment(Continue services with [place]) Date [place]: 01/29/19 Time [place]: 1030 Representative spoke with at [place]: [place] Determinants of Health ([place]) Interventions     Readmission Risk Interventions Readmission Risk Prevention Plan 01/29/2019 01/20/2019  Post Dischage Appt - Complete  Medication Screening - Complete   Transportation Screening Complete Complete  PCP or Specialist Appt within 5-7 Days Complete -  Home Care Screening Complete -  Medication Review (RN CM) Complete -  Some recent data might be hidden

## 2019-01-29 NOTE — Progress Notes (Signed)
Peripherally Inserted Central Catheter/Midline Placement  The IV Nurse has discussed with the patient and/or persons authorized to consent for the patient, the purpose of this procedure and the potential benefits and risks involved with this procedure.  The benefits include less needle sticks, lab draws from the catheter, and the patient may be discharged home with the catheter. Risks include, but not limited to, infection, bleeding, blood clot (thrombus formation), and puncture of an artery; nerve damage and irregular heartbeat and possibility to perform a PICC exchange if needed/ordered by physician.  Alternatives to this procedure were also discussed.  Bard Power PICC patient education guide, fact sheet on infection prevention and patient information card has been provided to patient /or left at bedside.    PICC/Midline Placement Documentation  PICC Single Lumen [id number] Right Basilic 39 cm 0 cm (Active)  Indication for Insertion or Continuance of Line Home intravenous therapies (PICC only) 01/29/19 0907  Exposed Catheter (cm) 0 cm 01/29/19 0907  Site Assessment Clean;Dry;Intact 01/29/19 0907  Line Status Blood return noted;Flushed;Saline locked 01/29/19 [id number]  Dressing Type Transparent;Securing device 01/29/19 [id number]  Dressing Status Clean;Dry;Intact;Antimicrobial disc in place 01/29/19 0907  Dressing Change Due 02/05/19 01/29/19 [id number]       Frances Maywood 01/29/2019, 9:11 AM

## 2019-01-29 NOTE — Progress Notes (Signed)
Patient in a stable condition, discharge education reviewed with patient and his son they verbalized understanding, iv removed, tele dc ccmd notified, patient belongings at bedside, patient to be transported home by his son.

## 2019-01-29 NOTE — Plan of Care (Signed)
  Problem: Education: Goal: Knowledge of prescribed regimen will improve Outcome: Completed/Met   Problem: Activity: Goal: Ability to tolerate increased activity will improve Outcome: Completed/Met   Problem: Bowel/Gastric: Goal: Gastrointestinal status for postoperative course will improve Outcome: Completed/Met   Problem: Clinical Measurements: Goal: Postoperative complications will be avoided or minimized Outcome: Completed/Met Goal: Signs and symptoms of graft occlusion will improve Outcome: Completed/Met   Problem: Skin Integrity: Goal: Demonstration of wound healing without infection will improve Outcome: Completed/Met   Problem: Metabolic: Goal: Ability to maintain appropriate glucose levels will improve Outcome: Completed/Met   Problem: Skin Integrity: Goal: Risk for impaired skin integrity will decrease Outcome: Completed/Met   Problem: Tissue Perfusion: Goal: Adequacy of tissue perfusion will improve Outcome: Completed/Met   Problem: Education: Goal: Knowledge of General Education information will improve Description: Including pain rating scale, medication(s)/side effects and non-pharmacologic comfort measures Outcome: Completed/Met   Problem: Clinical Measurements: Goal: Ability to maintain clinical measurements within normal limits will improve Outcome: Completed/Met Goal: Will remain free from infection Outcome: Completed/Met Goal: Diagnostic test results will improve Outcome: Completed/Met Goal: Respiratory complications will improve Outcome: Completed/Met Goal: Cardiovascular complication will be avoided Outcome: Completed/Met   Problem: Elimination: Goal: Will not experience complications related to urinary retention Outcome: Completed/Met   Problem: Pain Managment: Goal: General experience of comfort will improve Outcome: Completed/Met   Problem: Safety: Goal: Ability to remain free from injury will improve Outcome: Completed/Met

## 2019-01-29 NOTE — Progress Notes (Addendum)
[facility]  Subjective  - No new complaints   Objective (!) 153/67 67 97.9 F (36.6 C) (Oral) (!) 21 100%  Intake/Output Summary (Last 24 hours) at 01/29/2019 0740 Last data filed at 01/29/2019 0500 Gross per 24 hour  Intake 1095 ml  Output 1550 ml  Net -455 ml    Left LE incision healing well, groin soft Doppler Dp/PT/peroneal intact Left 5th met head wound no change Lungs non labored breathing  Post op ABI: ABI/TBIToday's ABIToday's TBIPrevious ABIPrevious TBI +-------+-----------+-----------+------------+------------+ Right  0.49                  0.85                     +-------+-----------+-----------+------------+------------+ Left   0.56                  0.6                      +-------+-----------+-----------+------------+------------+    Assessment/Planning: POD #2  1: Left common femoral and profundofemoral endarterectomy with vein patch angioplasty 2: Left femoral to above-knee popliteal artery bypass graft with reversed ipsilateral translocated saphenous vein  The ABI report shows no change/ slightly decreased from pre-op ABI.  I have ordered a STAT bypass duplex to check and make sure the bypass is patent.    Plan to replace his PICC line that accidentally came out and discharge today. Ambulation is stable.  Vancomycin and ceftriaxone for 6 weeks with end date planned for 02/28/19.  He has a f/u ID appt.  Cont. Dry dressing to left foot wound. F/U in 2-3 weeks with Dr. Cristi Loron 01/29/2019 7:40 AM --  Laboratory Lab Results: Recent Labs    01/27/19 2216 01/28/19 0315  WBC 14.6* 10.8*  HGB 11.1* 10.1*  HCT 33.5* 32.0*  PLT 271 257   BMET Recent Labs    01/27/19 1200 01/27/19 1345 01/27/19 2216 01/28/19 0315  NA 131* 133*  --  131*  K 4.3 4.3  --  4.4  CL 92* 95*  --  97*  CO2 26  --   --  25  GLUCOSE 299* 279*  --  222*  BUN 18 17   --  18  CREATININE 1.27* 0.90 0.93 1.06  CALCIUM 9.4  --   --  8.4*    COAG Lab Results  Component Value Date   INR 1.0 01/27/2019   No results found for: PTT  I have interviewed the patient and examined the patient. I agree with the findings by the PA.  Agree with plans for discharge today.  Gae Gallop, MD [phone]

## 2019-02-02 NOTE — Discharge Summary (Signed)
[facility] Discharge Summary   Patient ID:  Danny Carpenter MRN: [id number] DOB/AGE: July 01, 1950 68 y.o.  Admit date: 01/27/2019 Discharge date: 01/29/19 Date of Surgery: 01/27/2019 Surgeon: Surgeon(s): Serafina Mitchell, MD  Admission Diagnosis: left femoral occlusion  Discharge Diagnoses:  left femoral occlusion  Secondary Diagnoses: Past Medical History:  Diagnosis Date  . AAA (abdominal aortic aneurysm) ([place])   . Anxiety   . Arthritis   . CHF (congestive heart failure) ([place])   . COPD (chronic obstructive pulmonary disease) ([place])   . Depression   . DM2 (diabetes mellitus, type 2) ([place])   . GERD (gastroesophageal reflux disease)   . HTN (hypertension)   . Hypercholesteremia   . Myocardial infarction ([place])    "light" heart attack 15 years ago (per pt 01/26/19)  . Peripheral vascular disease ([place])   . Seizure [facility])    one time in Oct. 2020  . Sleep apnea    no cpap    Procedure(s): ENDARTERECTOMY LEFT FEMORAL ARTERY AND PROFUNDA  ARTERY BYPASS GRAFT FEMORAL-POPLITEAL ARTERY Left Saphenous Vein Harvest Patch Angioplasty using Vein  Discharged Condition: good  HPI: 68 y/o male with chronic left toe wound and reported  osteomyelitis in the fifth metatarsal head and proximal phalanx on foot x ray.   ID was consulted for antibiotic treatment plan.  He does have symptoms of claudication and has past history of left LE stenting in [place] 10-15 years ago.    The patient initially presented to [facility] with mental status changes and aphasia.  Code stroke was initiated and he received TPA and was transferred to [facility].  His neurologic status is back to baseline.  The patient is a poorly controlled diabetic with obesity.  He suffers from obstructive sleep apnea as well as COPD.  He is medically managed for hypertension.  +-------+-----------+-----------+------------+------------+ ABI/TBIToday's ABIToday's TBIPrevious ABIPrevious  TBI +-------+-----------+-----------+------------+------------+ Right 0.85 0.63    +-------+-----------+-----------+------------+------------+ Left 0.6     +-------+-----------+-----------+------------+------------+   Carotid duplex: Right Carotid: Velocities in the right ICA are consistent with a 1-39% stenosis.  Left Carotid: Velocities in the left ICA are consistent with a 1-39% stenosis.  Hospital Course:  Renly Roots is a 68 y.o. male is S/P  Procedure(s): Aortogram 01/19/2019  Left Lower Extremity: The left common femoral artery is patent.  The profundofemoral artery is patent however there is a 90% stenosis near its origin.  The superficial femoral artery is flush occluded.  There is reconstitution of the above-knee popliteal artery with two-vessel runoff.  01/27/19 ENDARTERECTOMY LEFT FEMORAL ARTERY AND PROFUNDA  ARTERY BYPASS GRAFT FEMORAL-POPLITEAL ARTERY Left Saphenous Vein Harvest Patch Angioplasty using Vein  Post op he had brisk PT signal and DP/Peroneal signals on the left LE.    Blood cultures from 10/18 have remained without growth to date and plan is for treatment of culture negative osteomyelitis of the left foot.PICC line placed. Discussed plan with Home Health and will arrange for vancomycin and ceftriaxone for 6 weeks with end date planned for 02/28/19.  PICC line came out accidentally on 01/28/2019 and had to be replaced prior to discharge home.    Post op ABI: ABI/TBIToday's ABIToday's TBIPrevious ABIPrevious TBI +-------+-----------+-----------+------------+------------+ Right 0.49  0.85   +-------+-----------+-----------+------------+------------+ Left 0.56  0.6   +-------+-----------+-----------+------------+------------+  The ABI report shows no change/ slightly decreased  from pre-op ABI.  I have ordered a STAT bypass duplex to check and make sure the bypass is patent.    Left Graft #1: +--------------------+--------+--------+----------+--------+  PSV cm/sStenosisWaveform  Comments +--------------------+--------+--------+----------+--------+ Inflow              136             monophasic         +--------------------+--------+--------+----------+--------+ Proximal Anastomosis147             monophasic         +--------------------+--------+--------+----------+--------+ Proximal Graft                                         +--------------------+--------+--------+----------+--------+ Mid Graft           144             monophasic         +--------------------+--------+--------+----------+--------+ Distal Graft        96              monophasic         +--------------------+--------+--------+----------+--------+ Distal Anastomosis  88              monophasic         +--------------------+--------+--------+----------+--------+ Outflow             82              monophasic         +--------------------+--------+--------+----------+--------+        Summary: Left: Patent left femoral-popliteal bypass graft.  Cont. Dry dressing to left foot wound. F/U in 2-3 weeks with Dr. Trula Slade  Discharged home in stable condition.      Significant Diagnostic Studies: CBC Lab Results  Component Value Date   WBC 10.8 (H) 01/28/2019   HGB 10.1 (L) 01/28/2019   HCT 32.0 (L) 01/28/2019   MCV 92.2 01/28/2019   PLT 257 01/28/2019    BMET    Component Value Date/Time   NA 131 (L) 01/28/2019 0315   K 4.4 01/28/2019 0315   CL 97 (L) 01/28/2019 0315   CO2 25 01/28/2019 0315   GLUCOSE 222 (H) 01/28/2019 0315   BUN 18 01/28/2019 0315   CREATININE 1.06 01/28/2019 0315   CALCIUM 8.4 (L) 01/28/2019 0315   GFRNONAA >60 01/28/2019 0315   GFRAA >60 01/28/2019 0315   COAG Lab Results  Component  Value Date   INR 1.0 01/27/2019     Disposition:  Discharge to :Home Discharge Instructions    Call MD for:  redness, tenderness, or signs of infection (pain, swelling, bleeding, redness, odor or green/yellow discharge around incision site)   Complete by: As directed    Call MD for:  severe or increased pain, loss or decreased feeling  in affected limb(s)   Complete by: As directed    Call MD for:  temperature >100.5   Complete by: As directed    Resume previous diet   Complete by: As directed      Allergies as of 01/29/2019   No Known Allergies     Medication List    TAKE these medications   Align 4 MG Caps Take 4 mg by mouth daily.   amLODipine 10 MG tablet Commonly known as: NORVASC Take 1 tablet (10 mg total) by mouth daily.   aspirin 81 MG EC tablet Take 1 tablet (81 mg total) by mouth daily.   atorvastatin 40 MG tablet Commonly known as: LIPITOR Take 1 tablet (40 mg total) by mouth daily at 6 PM.   cefTRIAXone  IVPB Commonly known as: ROCEPHIN Inject 2 g into the vein daily. Indication:  Osteomyelitis  Last Day of Therapy:  02/28/2019 Labs - Once weekly:  CBC/D and BMP, Labs - Every other week:  ESR and CRP   diflunisal 500 MG Tabs tablet Commonly known as: DOLOBID Take 500 mg by mouth 2 (two) times daily as needed (for pain management- take with food or milk).   DULoxetine 60 MG capsule Commonly known as: CYMBALTA Take 60 mg by mouth daily.   fenofibrate 160 MG tablet Take 160 mg by mouth daily.   Gralise 600 MG Tabs Generic drug: Gabapentin (Once-Daily) Take 1,200 mg by mouth at bedtime.   levETIRAcetam 500 MG tablet Commonly known as: KEPPRA Take 1 tablet (500 mg total) by mouth 2 (two) times daily.   lisinopril 40 MG tablet Commonly known as: ZESTRIL Take 40 mg by mouth daily.   morphine 15 MG 12 hr tablet Commonly known as: MS CONTIN Take 15 mg by mouth every 12 (twelve) hours.   mupirocin ointment 2 % Commonly known as:  BACTROBAN Apply 1 application topically See admin instructions. Apply topically as directed for wound care   NovoLOG FlexPen 100 UNIT/ML FlexPen Generic drug: insulin aspart Inject 6-8 Units into the skin See admin instructions. Inject 6-8 units into the skin two times a day (morning and early afternoon) as needed, per sliding scale: BGL 300-350 = 6 units; 351-400 = 8 units; >400 = CALL STAYWELL   pantoprazole 40 MG tablet Commonly known as: PROTONIX Take 40 mg by mouth daily before breakfast.   sodium chloride 1 g tablet Take 2 g by mouth See admin instructions. Take 2 grams by mouth in the morning and 2 grams at bedtime   spironolactone 25 MG tablet Commonly known as: ALDACTONE Take 25 mg by mouth daily.   tamsulosin 0.4 MG Caps capsule Commonly known as: FLOMAX Take 0.4 mg by mouth at bedtime.   traZODone 100 MG tablet Commonly known as: DESYREL Take 100 mg by mouth at bedtime.   vancomycin  IVPB Inject 1,000 mg into the vein every 12 (twelve) hours. Indication:  Osteomyelitis  Last Day of Therapy:  02/28/2019 Labs - Sunday/Monday:  CBC/D, BMP, and vancomycin trough. Labs - Thursday:  BMP and vancomycin trough Labs - Every other week:  ESR and CRP   Xultophy 100-3.6 UNIT-MG/ML Sopn Generic drug: Insulin Degludec-Liraglutide Inject 22 Units into the skin daily before breakfast.      Verbal and written Discharge instructions given to the patient. Wound care per Discharge AVS Follow-up Information    Serafina Mitchell, MD Follow up in 2 week(s).   Specialties: Vascular Surgery, Cardiology Why: office will call Contact information: [place] [address] [phone]           Signed: Roxy Horseman 02/02/2019, 4:44 PM - For VQI Registry use --- Instructions: Press F2 to tab through selections.  Delete question if not applicable.   Post-op:  Wound infection: No  Graft infection: No  Transfusion: No  If yes, 0 units given New Arrhythmia:  No Ipsilateral amputation: [x ] no, [ ]  Minor, [ ]  BKA, [ ]  AKA Discharge patency: [ ]  Primary, [x ] Primary assisted, [ ]  Secondary, [ ]  Occluded Patency judged by: [x ] Dopper only, [ ]  Palpable graft pulse, [ ]  Palpable distal pulse, [ ]  ABI inc. > 0.15, [ ]  Duplex Discharge ABI: R 0.85, L 0.6  D/C Ambulatory Status: Ambulatory  Complications: MI: [x ]  No, [ ]  Troponin only, [ ]  EKG or Clinical CHF: No Resp failure: [x ] none, [ ]  Pneumonia, [ ]  Ventilator Chg in renal function: x[ ]  none, [ ]  Inc. Cr > 0.5, [ ]  Temp. Dialysis, [ ]  Permanent dialysis Stroke: [x ] None, [ ]  Minor, [ ]  Major Return to OR: No  Reason for return to OR: [ ]  Bleeding, [ ]  Infection, [ ]  Thrombosis, [ ]  Revision  Discharge medications: Statin use:  Yes ASA use:  Yes Plavix use:  No  for medical reason   Beta blocker use: No  for medical reason   Coumadin use: No  for medical reason

## 2019-02-05 ENCOUNTER — Other Ambulatory Visit: Payer: Self-pay

## 2019-02-05 DIAGNOSIS — I714 Abdominal aortic aneurysm, without rupture, unspecified: Secondary | ICD-10-CM

## 2019-02-08 ENCOUNTER — Ambulatory Visit (INDEPENDENT_AMBULATORY_CARE_PROVIDER_SITE_OTHER): Payer: No Typology Code available for payment source | Admitting: Orthopedic Surgery

## 2019-02-08 ENCOUNTER — Encounter: Payer: Self-pay | Admitting: Orthopedic Surgery

## 2019-02-08 ENCOUNTER — Ambulatory Visit (INDEPENDENT_AMBULATORY_CARE_PROVIDER_SITE_OTHER): Payer: No Typology Code available for payment source

## 2019-02-08 ENCOUNTER — Other Ambulatory Visit: Payer: Self-pay

## 2019-02-08 VITALS — Ht 73.0 in | Wt 215.0 lb

## 2019-02-08 DIAGNOSIS — L97929 Non-pressure chronic ulcer of unspecified part of left lower leg with unspecified severity: Secondary | ICD-10-CM | POA: Diagnosis not present

## 2019-02-08 DIAGNOSIS — L97524 Non-pressure chronic ulcer of other part of left foot with necrosis of bone: Secondary | ICD-10-CM | POA: Diagnosis not present

## 2019-02-08 DIAGNOSIS — I87333 Chronic venous hypertension (idiopathic) with ulcer and inflammation of bilateral lower extremity: Secondary | ICD-10-CM

## 2019-02-08 DIAGNOSIS — M79672 Pain in left foot: Secondary | ICD-10-CM

## 2019-02-08 DIAGNOSIS — L97919 Non-pressure chronic ulcer of unspecified part of right lower leg with unspecified severity: Secondary | ICD-10-CM

## 2019-02-09 ENCOUNTER — Encounter: Payer: Self-pay | Admitting: Orthopedic Surgery

## 2019-02-09 NOTE — Progress Notes (Signed)
Office Visit Note   Patient: Danny Carpenter           Date of Birth: 1951/02/24           MRN: [id number] Visit Date: 02/08/2019              Requested by: Melony Overly, MD [address] [place],  [place] [address] PCP: Melony Overly, MD  Chief Complaint  Patient presents with  . Left Foot - Follow-up    Left Foot 5th Metatarsal Head Ulcer      HPI: Patient is a 68 year old gentleman who was seen in follow-up from the hospital with a ulcer over the fifth metatarsal head left foot.  Patient has been on antibiotics through a PICC line for limb salvage.  Patient states he has had swelling in both lower extremities.  Assessment & Plan: Visit Diagnoses:  1. Pain in left foot   2. Chronic venous hypertension (idiopathic) with ulcer and inflammation of bilateral lower extremity (HCC)     Plan: Patient has progressive destructive changes of the bone consistent with advanced osteomyelitis.  Will need to proceed with 1/5 ray amputation.  We will set this up for this week.  Risks and benefits were discussed including risk of the wound not healing need for additional surgery.  Patient states he understands wished to proceed at this time.  Follow-Up Instructions: Return in about 2 weeks (around 02/22/2019).   Ortho Exam  Patient is alert, oriented, no adenopathy, well-dressed, normal affect, normal respiratory effort. Examination patient has pitting edema in both lower extremities with venous insufficiency.  After informed consent a 10 blade knife was used to debride the skin and soft tissue over the ulcerative area fifth metatarsal head.  The ulcer is 2 cm in diameter and probes to bone.  There is destructive bony changes that are palpable with the silver nitrate.  Patient does have a palpable dorsalis pedis pulse.  Patient with uncontrolled type 2 diabetes with most recent hemoglobin A1c 13.5. Imaging: Xr Foot Complete Left  Result Date: 02/09/2019 3 view radiographs of the  left foot shows advanced destructive changes of the fifth metatarsal consistent with chronic osteomyelitis.  No images are attached to the encounter.  Labs: Lab Results  Component Value Date   HGBA1C 13.5 (H) 01/18/2019   HGBA1C 10.8 (H) 01/17/2019   REPTSTATUS 01/18/2019 FINAL 01/17/2019   CULT  01/17/2019    NO GROWTH Performed at [facility], [place] [address] [place], [place] [address]      Lab Results  Component Value Date   ALBUMIN 3.8 01/27/2019    No results found for: MG No results found for: VD25OH  No results found for: PREALBUMIN CBC EXTENDED Latest Ref Rng & Units 01/28/2019 01/27/2019 01/27/2019  WBC 4.0 - 10.5 K/uL 10.8(H) 14.6(H) -  RBC 4.22 - 5.81 MIL/uL 3.47(L) 3.72(L) -  HGB 13.0 - 17.0 g/dL 10.1(L) 11.1(L) 10.9(L)  HCT 39.0 - 52.0 % 32.0(L) 33.5(L) 32.0(L)  PLT 150 - 400 K/uL 257 271 -  NEUTROABS 1.7 - 7.7 K/uL - - -  LYMPHSABS 0.7 - 4.0 K/uL - - -     Body mass index is 28.37 kg/m.  Orders:  Orders Placed This Encounter  Procedures  . XR Foot Complete Left   No orders of the defined types were placed in this encounter.    Procedures: No procedures performed  Clinical Data: No additional findings.  ROS:  All other systems negative, except as  noted in the HPI. Review of Systems  Objective: Vital Signs: Ht 6\' 1"  (1.854 m)   Wt 215 lb (97.5 kg)   BMI 28.37 kg/m   Specialty Comments:  No specialty comments available.  PMFS History: Patient Active Problem List   Diagnosis Date Noted  . PAD (peripheral artery disease) ([place]) 01/27/2019  . Diabetic foot ulcer with osteomyelitis ([place])   . Subacute osteomyelitis of left foot ([place])   . PVD (peripheral vascular disease) ([place])   . CVA (cerebral vascular accident) ([place]) 01/17/2019  . Diabetic foot infection ([place]) 01/17/2019   Past Medical History:  Diagnosis Date  . AAA (abdominal aortic aneurysm) ([place])   . Anxiety   . Arthritis   . CHF (congestive heart failure) ([place])   .  COPD (chronic obstructive pulmonary disease) ([place])   . Depression   . DM2 (diabetes mellitus, type 2) ([place])   . GERD (gastroesophageal reflux disease)   . HTN (hypertension)   . Hypercholesteremia   . Myocardial infarction ([place])    "light" heart attack 15 years ago (per pt 01/26/19)  . Peripheral vascular disease ([place])   . Seizure [facility])    one time in Oct. 2020  . Sleep apnea    no cpap    History reviewed. No pertinent family history.  Past Surgical History:  Procedure Laterality Date  . ABDOMINAL AORTOGRAM W/LOWER EXTREMITY N/A 01/19/2019   Procedure: ABDOMINAL AORTOGRAM W/LOWER EXTREMITY;  Surgeon: Serafina Mitchell, MD;  Location: [facility];  Service: Cardiovascular;  Laterality: N/A;  . APPENDECTOMY    . ENDARTERECTOMY FEMORAL Left 01/27/2019   Procedure: ENDARTERECTOMY LEFT FEMORAL ARTERY AND PROFUNDA  ARTERY;  Surgeon: Serafina Mitchell, MD;  Location: [facility];  Service: Vascular;  Laterality: Left;  . FEMORAL-POPLITEAL BYPASS GRAFT Left 01/27/2019   Procedure: BYPASS GRAFT FEMORAL-POPLITEAL ARTERY;  Surgeon: Serafina Mitchell, MD;  Location: [facility];  Service: Vascular;  Laterality: Left;  . FINGER AMPUTATION     Left second and third fingers, work accident  . KNEE SURGERY Left   . LOWER EXTREMITY ANGIOGRAM  05/2008   Left SFA atherectomy with diamondback 2.25 utilizing distal protection device NAV6 placed in the left popliteal artery. Angioplasty of the left SFA with ATV balloon 6 x 40   . PATCH ANGIOPLASTY Left 01/27/2019   Procedure: Patch Angioplasty using Vein;  Surgeon: Serafina Mitchell, MD;  Location: [facility];  Service: Vascular;  Laterality: Left;  . SHOULDER SURGERY Right   . VEIN HARVEST Left 01/27/2019   Procedure: Left Saphenous Vein Harvest;  Surgeon: Serafina Mitchell, MD;  Location: [facility];  Service: Vascular;  Laterality: Left;   Social History   Occupational History  . Occupation: Disabled Administrator  Tobacco Use  . Smoking status: Former Smoker     Packs/day: 1.00    Years: 40.00    Pack years: 40.00    Types: Cigarettes    Quit date: 04/01/2009    Years since quitting: 9.8  . Smokeless tobacco: Never Used  Substance and Sexual Activity  . Alcohol use: Not Currently  . Drug use: Never  . Sexual activity: Not on file

## 2019-02-10 ENCOUNTER — Telehealth: Payer: Self-pay | Admitting: Orthopedic Surgery

## 2019-02-10 NOTE — Telephone Encounter (Signed)
02/08/2019 ov note faxed to [facility] [phone]

## 2019-02-11 ENCOUNTER — Telehealth: Payer: Self-pay | Admitting: Orthopedic Surgery

## 2019-02-11 NOTE — Telephone Encounter (Signed)
Danny Carpenter with [employer] (patient's PCP) called to cancel 5th ray amputation scheduled for Friday 02/12/2019.  They spoke with the patient and his family and they decided to not proceed with surgery.

## 2019-02-12 ENCOUNTER — Ambulatory Visit: Admit: 2019-02-12 | Payer: No Typology Code available for payment source | Admitting: Orthopedic Surgery

## 2019-02-12 ENCOUNTER — Other Ambulatory Visit (HOSPITAL_COMMUNITY): Payer: No Typology Code available for payment source

## 2019-02-12 SURGERY — AMPUTATION, FOOT, RAY
Anesthesia: Choice | Laterality: Left

## 2019-02-15 ENCOUNTER — Other Ambulatory Visit (HOSPITAL_COMMUNITY): Payer: No Typology Code available for payment source

## 2019-02-15 ENCOUNTER — Encounter: Payer: No Typology Code available for payment source | Admitting: Surgery

## 2019-02-16 ENCOUNTER — Ambulatory Visit (INDEPENDENT_AMBULATORY_CARE_PROVIDER_SITE_OTHER): Payer: No Typology Code available for payment source | Admitting: Infectious Diseases

## 2019-02-16 ENCOUNTER — Telehealth: Payer: Self-pay

## 2019-02-16 ENCOUNTER — Inpatient Hospital Stay: Payer: No Typology Code available for payment source | Admitting: Infectious Diseases

## 2019-02-16 ENCOUNTER — Other Ambulatory Visit: Payer: Self-pay

## 2019-02-16 ENCOUNTER — Encounter: Payer: Self-pay | Admitting: Infectious Diseases

## 2019-02-16 VITALS — BP 123/72 | HR 77 | Temp 98.5°F | Wt 224.0 lb

## 2019-02-16 DIAGNOSIS — I714 Abdominal aortic aneurysm, without rupture, unspecified: Secondary | ICD-10-CM

## 2019-02-16 DIAGNOSIS — I739 Peripheral vascular disease, unspecified: Secondary | ICD-10-CM | POA: Diagnosis not present

## 2019-02-16 DIAGNOSIS — Z79899 Other long term (current) drug therapy: Secondary | ICD-10-CM | POA: Diagnosis not present

## 2019-02-16 DIAGNOSIS — Z794 Long term (current) use of insulin: Secondary | ICD-10-CM

## 2019-02-16 DIAGNOSIS — Z452 Encounter for adjustment and management of vascular access device: Secondary | ICD-10-CM

## 2019-02-16 DIAGNOSIS — M86272 Subacute osteomyelitis, left ankle and foot: Secondary | ICD-10-CM

## 2019-02-16 DIAGNOSIS — E1169 Type 2 diabetes mellitus with other specified complication: Secondary | ICD-10-CM

## 2019-02-16 HISTORY — DX: Encounter for adjustment and management of vascular access device: Z45.2

## 2019-02-16 NOTE — Patient Instructions (Addendum)
Nice to meet you both today.   I am worried you have damage to the bone that still may indeed require amputation. It feels as if it may be broken. Please keep your appointment with Dr. Sharol Given on 11/19.   In the mean time keep off your feet as much as possible, elevate the foot to the level of the heart to help with swelling.   Please ask your vascular team tomorrow if it is OK for you to use compression stockings to this leg; as long as they feel your arteries are open enough I am hopeful you can get some stockings to control the swelling.  Please use the orthopedic shoe to help offset pressure from your left toe. Walker for stability if needed.   Please come back to see Dr. Baxter Flattery or Terri Piedra, NP in 2 weeks for follow up.

## 2019-02-16 NOTE — Telephone Encounter (Signed)
Per Janene Madeira, Np called home health to have lab results from the past 4 weeks faxed over to our office. Left voicemail with clinical team. Left office contact information if they have any questions. P: [place], [place]

## 2019-02-16 NOTE — Assessment & Plan Note (Signed)
I am concerned he has sustained a fracture as a result of his osteomyelitis and chronic neuropathic ulcer. I discussed that ultimately amputation may be the best way to go here given the concern for the above. Will for now continue 2 more weeks of IV antibiotics and have him follow up with Dr. Sharol Given as scheduled for repeat evaluation and repeat xray at that time.  He is open to amputation and apparently it is [facility] that would like a second opinion.

## 2019-02-16 NOTE — Progress Notes (Signed)
Patient: Danny Carpenter  DOB: 1950-04-30 MRN: [id number] PCP: Melony Overly, MD    Patient Active Problem List   Diagnosis Date Noted   PICC (peripherally inserted central catheter) in place 02/16/2019   Encounter for medication management 02/16/2019   PAD (peripheral artery disease) ([place]) 01/27/2019   Type 2 diabetes mellitus with other specified complication (HCC)    Subacute osteomyelitis of left foot ([place])    PVD (peripheral vascular disease) ([place])    CVA (cerebral vascular accident) ([place]) 01/17/2019   Diabetic foot infection ([place]) 01/17/2019     Subjective:  Danny Carpenter is a 68 y.o. male here for hospital follow up following treatment of chronic diabetic foot ulcer and osteomyelitis. He has a history of hospitalization 01/19/19 for care. He has a history of poorly controlled diabetes and obesity.   Danny Carpenter was seen in the hospital by Terri Piedra, NP and Dr. Baxter Flattery about 4 weeks ago. He had a plantar ulcer on the left 5th phalynx with MRI findings concerning for osteomyelitis of the left proximal phalanx and metatarsal head. He was transferred to [facility] from [facility] for care. Blood cultures were without growth. He was started on Vancomycin + Ceftriaxone for empiric coverage with plan for 6 weeks through 02/28/19.   Aortogram was done on 10/20 revealing 90% stenosis at the profundofemoral artery with occlusion of the superficial femoral artery. 01/27/19 an endarterectomy was performed by Dr. Trula Slade with profunda artery bypass graft to the femoral-popliteal artery with satisfactory result. He has some concerns about his belt rubbing on the incision line in the left groin and needs some suggestions for his clothing to protect it from wear. All incisions he reports are well healed. He has continued to have significant swelling in the left leg and describes it to be very heavy. He walks in bedroom shoes most of the times because he cannot fit into his sneakers.  He has an orthopedic boot but does not use it because of heel pain. He has not had any fevers or chills and describes the drainage to be improved significantly and the wound is healing up nicely with only a short amount of tunneling. He does still have pain in the 5th toe and foot when he walks on it.   He had a follow up visit with Dr. Sharol Given on 11/09 that revealed concern for ongoing evidence of destructive changes to the bone on repeat x-ray. He recommended ray amputation which was planned however [facility] has requested a second opinion. "If it were up to me I would just as well go ahead with it. My life is more important than my toe."   He is having blood work done twice a week through [facility].  No labs are available for review.    Review of Systems  Constitutional: Negative for chills, fever, malaise/fatigue and weight loss.  HENT: Negative for sore throat.        No dental problems  Respiratory: Negative for cough and sputum production.   Cardiovascular: Negative for chest pain and leg swelling.  Gastrointestinal: Negative for abdominal pain, diarrhea and vomiting.  Genitourinary: Negative for dysuria and flank pain.  Musculoskeletal: Negative for joint pain, myalgias and neck pain.  Skin: Negative for rash.  Neurological: Negative for dizziness, tingling and headaches.  Psychiatric/Behavioral: Negative for depression and substance abuse. The patient is not nervous/anxious and does not have insomnia.     Past Medical History:  Diagnosis Date   AAA (abdominal  aortic aneurysm) (HCC)    Anxiety    Arthritis    CHF (congestive heart failure) (HCC)    COPD (chronic obstructive pulmonary disease) (HCC)    Depression    DM2 (diabetes mellitus, type 2) (HCC)    GERD (gastroesophageal reflux disease)    HTN (hypertension)    Hypercholesteremia    Myocardial infarction ([place])    "light" heart attack 15 years ago (per pt 01/26/19)   Peripheral vascular disease  ([place])    Seizure ([place])    one time in Oct. 2020   Sleep apnea    no cpap    Outpatient Medications Prior to Visit  Medication Sig Dispense Refill   amLODipine (NORVASC) 10 MG tablet Take 1 tablet (10 mg total) by mouth daily. 30 tablet 0   aspirin EC 81 MG EC tablet Take 1 tablet (81 mg total) by mouth daily. 30 tablet 0   atorvastatin (LIPITOR) 40 MG tablet Take 1 tablet (40 mg total) by mouth daily at 6 PM. 30 tablet 0   cefTRIAXone (ROCEPHIN) IVPB Inject 2 g into the vein daily. Indication:  Osteomyelitis  Last Day of Therapy:  02/28/2019 Labs - Once weekly:  CBC/D and BMP, Labs - Every other week:  ESR and CRP 39 Units 0   diflunisal (DOLOBID) 500 MG TABS tablet Take 500 mg by mouth 2 (two) times daily as needed (for pain management- take with food or milk).     DULoxetine (CYMBALTA) 60 MG capsule Take 60 mg by mouth daily.     fenofibrate 160 MG tablet Take 160 mg by mouth daily.     Gabapentin, Once-Daily, (GRALISE) 600 MG TABS Take 1,200 mg by mouth at bedtime.     insulin aspart (NOVOLOG FLEXPEN) 100 UNIT/ML FlexPen Inject 6-8 Units into the skin See admin instructions. Inject 6-8 units into the skin two times a day (morning and early afternoon) as needed, per sliding scale: BGL 300-350 = 6 units; 351-400 = 8 units; >400 = CALL [facility]     Insulin Degludec-Liraglutide (XULTOPHY) 100-3.6 UNIT-MG/ML SOPN Inject 22 Units into the skin daily before breakfast.      levETIRAcetam (KEPPRA) 500 MG tablet Take 1 tablet (500 mg total) by mouth 2 (two) times daily. 60 tablet 0   lisinopril (ZESTRIL) 40 MG tablet Take 40 mg by mouth daily.     morphine (MS CONTIN) 15 MG 12 hr tablet Take 15 mg by mouth every 12 (twelve) hours.     mupirocin ointment (BACTROBAN) 2 % Apply 1 application topically See admin instructions. Apply topically as directed for wound care      pantoprazole (PROTONIX) 40 MG tablet Take 40 mg by mouth daily before breakfast.     Probiotic Product (ALIGN)  4 MG CAPS Take 4 mg by mouth daily.     sodium chloride 1 g tablet Take 2 g by mouth See admin instructions. Take 2 grams by mouth in the morning and 2 grams at bedtime     spironolactone (ALDACTONE) 25 MG tablet Take 25 mg by mouth daily.     tamsulosin (FLOMAX) 0.4 MG CAPS capsule Take 0.4 mg by mouth at bedtime.     traZODone (DESYREL) 100 MG tablet Take 100 mg by mouth at bedtime.     vancomycin IVPB Inject 1,000 mg into the vein every 12 (twelve) hours. Indication:  Osteomyelitis  Last Day of Therapy:  02/28/2019 Labs - Sunday/Monday:  CBC/D, BMP, and vancomycin trough. Labs - Thursday:  BMP and  vancomycin trough Labs - Every other week:  ESR and CRP 78 Units 0   No facility-administered medications prior to visit.      No Known Allergies  Social History   Tobacco Use   Smoking status: Former Smoker    Packs/day: 1.00    Years: 40.00    Pack years: 40.00    Types: Cigarettes    Quit date: 04/01/2009    Years since quitting: 9.8   Smokeless tobacco: Never Used  Substance Use Topics   Alcohol use: Not Currently   Drug use: Never    No family history on file.  Objective:   Vitals:   02/16/19 1348  BP: 123/72  Pulse: 77  Temp: 98.5 F (36.9 C)  TempSrc: Oral  Weight: 224 lb (101.6 kg)   Body mass index is 29.55 kg/m.  Physical Exam  Lab Results: Lab Results  Component Value Date   WBC 10.8 (H) 01/28/2019   HGB 10.1 (L) 01/28/2019   HCT 32.0 (L) 01/28/2019   MCV 92.2 01/28/2019   PLT 257 01/28/2019    Lab Results  Component Value Date   CREATININE 1.06 01/28/2019   BUN 18 01/28/2019   NA 131 (L) 01/28/2019   K 4.4 01/28/2019   CL 97 (L) 01/28/2019   CO2 25 01/28/2019    Lab Results  Component Value Date   ALT 19 01/27/2019   AST 20 01/27/2019   ALKPHOS 57 01/27/2019   BILITOT 0.7 01/27/2019     Assessment & Plan:   Problem List Items Addressed This Visit      Unprioritized   Type 2 diabetes mellitus with other specified  complication [facility])    He needs more aggressive management for diabetes in my opinion. Reports that his fasting blood sugars are still often > 200. He is only instructed to inject novolog if his glucose is > 300. He really needs to keep an A1C < 8%. Will request his [facility] team to reconsider current prescribed regimen.       Subacute osteomyelitis of left foot (HCC) - Primary    I am concerned he has sustained a fracture as a result of his osteomyelitis and chronic neuropathic ulcer. I discussed that ultimately amputation may be the best way to go here given the concern for the above. Will for now continue 2 more weeks of IV antibiotics and have him follow up with Dr. Sharol Given as scheduled for repeat evaluation and repeat xray at that time.  He is open to amputation and apparently it is [facility] that would like a second opinion.       PICC (peripherally inserted central catheter) in place    Site unremarkable, well maintained and functioning as expected. Continue care and maintenance until planned end of IV antibiotics. Home Health team to remove at completion.        PAD (peripheral artery disease) ([place])    Well perfused and warm foot. Significant increase in swelling. Will defer to vascular team if he is now appropriate candidate for compression therapy at his appointment tomorrow.       Encounter for medication management    I have called [facility] at Phone: [phone] to request all labs done over 4 weeks to be faxed to ID clinic so we can follow up results for therapeutic and safety monitoring.          Will RTC in 2 weeks to follow up again at the end of therapy with Dr. Baxter Flattery or Marya Amsler.  Janene Madeira, MSN, NP-C [facility] Pager: [phone] Office: [phone]  02/16/19  2:51 PM

## 2019-02-16 NOTE — Telephone Encounter (Signed)
Per Janene Madeira, NP faxed office note to [facility].  F: [place], [place]

## 2019-02-16 NOTE — Assessment & Plan Note (Signed)
I have called [facility] at Phone: [phone] to request all labs done over 4 weeks to be faxed to ID clinic so we can follow up results for therapeutic and safety monitoring.

## 2019-02-16 NOTE — Assessment & Plan Note (Signed)
Site unremarkable, well maintained and functioning as expected. Continue care and maintenance until planned end of IV antibiotics. Home Health team to remove at completion.   

## 2019-02-16 NOTE — Assessment & Plan Note (Signed)
Well perfused and warm foot. Significant increase in swelling. Will defer to vascular team if he is now appropriate candidate for compression therapy at his appointment tomorrow.

## 2019-02-16 NOTE — Assessment & Plan Note (Signed)
He needs more aggressive management for diabetes in my opinion. Reports that his fasting blood sugars are still often > 200. He is only instructed to inject novolog if his glucose is > 300. He really needs to keep an A1C < 8%. Will request his [facility] team to reconsider current prescribed regimen.

## 2019-02-17 ENCOUNTER — Ambulatory Visit (HOSPITAL_COMMUNITY)
Admission: RE | Admit: 2019-02-17 | Discharge: 2019-02-17 | Disposition: A | Discharge status: Home / Self Care | Payer: No Typology Code available for payment source | Source: Ambulatory Visit | Attending: Family | Admitting: Family

## 2019-02-17 ENCOUNTER — Telehealth: Payer: Self-pay | Admitting: Orthopedic Surgery

## 2019-02-17 ENCOUNTER — Ambulatory Visit (INDEPENDENT_AMBULATORY_CARE_PROVIDER_SITE_OTHER): Payer: Self-pay | Admitting: Family

## 2019-02-17 ENCOUNTER — Encounter: Payer: Self-pay | Admitting: Family

## 2019-02-17 ENCOUNTER — Other Ambulatory Visit: Payer: Self-pay

## 2019-02-17 VITALS — BP 155/77 | HR 84 | Temp 98.0°F | Resp 12 | Ht 69.0 in | Wt 224.0 lb

## 2019-02-17 DIAGNOSIS — I714 Abdominal aortic aneurysm, without rupture, unspecified: Secondary | ICD-10-CM

## 2019-02-17 DIAGNOSIS — I779 Disorder of arteries and arterioles, unspecified: Secondary | ICD-10-CM

## 2019-02-17 NOTE — Patient Instructions (Signed)
To measure for knee high compression hose: Measure the length of calf (from the crease of the knee to the bottom of the heel), largest circumference of calf, and ankle circumference first thing in the morning before your legs have a chance to swell.  Take these 3 measurements with you to obtain 20-30 mm mercury graduated knee high compression hose.  Put the stockings on in the morning, remove at bedtime.     To decrease swelling in your feet and legs: Elevate feet above slightly bent knees, feet above heart, overnight and 3-4 times per day for 20 minutes.   Peripheral Vascular Disease  Peripheral vascular disease (PVD) is a disease of the blood vessels that are not part of your heart and brain. A simple term for PVD is poor circulation. In most cases, PVD narrows the blood vessels that carry blood from your heart to the rest of your body. This can reduce the supply of blood to your arms, legs, and internal organs, like your stomach or kidneys. However, PVD most often affects a person's lower legs and feet. Without treatment, PVD tends to get worse. PVD can also lead to acute ischemic limb. This is when an arm or leg suddenly cannot get enough blood. This is a medical emergency. Follow these instructions at home: Lifestyle  Do not use any products that contain nicotine or tobacco, such as cigarettes and e-cigarettes. If you need help quitting, ask your doctor.  Lose weight if you are overweight. Or, stay at a healthy weight as told by your doctor.  Eat a diet that is low in fat and cholesterol. If you need help, ask your doctor.  Exercise regularly. Ask your doctor for activities that are right for you. General instructions  Take over-the-counter and prescription medicines only as told by your doctor.  Take good care of your feet: ? Wear comfortable shoes that fit well. ? Check your feet often for any cuts or sores.  Keep all follow-up visits as told by your doctor This is  important. Contact a doctor if:  You have cramps in your legs when you walk.  You have leg pain when you are at rest.  You have coldness in a leg or foot.  Your skin changes.  You are unable to get or have an erection (erectile dysfunction).  You have cuts or sores on your feet that do not heal. Get help right away if:  Your arm or leg turns cold, numb, and blue.  Your arms or legs become red, warm, swollen, painful, or numb.  You have chest pain.  You have trouble breathing.  You suddenly have weakness in your face, arm, or leg.  You become very confused or you cannot speak.  You suddenly have a very bad headache.  You suddenly cannot see. Summary  Peripheral vascular disease (PVD) is a disease of the blood vessels.  A simple term for PVD is poor circulation. Without treatment, PVD tends to get worse.  Treatment may include exercise, low fat and low cholesterol diet, and quitting smoking. This information is not intended to replace advice given to you by your health care provider. Make sure you discuss any questions you have with your health care provider. Document Released: 06/12/2009 Document Revised: 02/28/2017 Document Reviewed: 04/25/2016 Elsevier Patient Education  2020 [employer].

## 2019-02-17 NOTE — Progress Notes (Signed)
[facility] [place]   CC: Post op follow up s/p left femoral to above-knee popliteal artery bypass graft, peripheral artery occlusive disease  History of Present Illness Danny Carpenter is a 67 y.o. male who is s/p left common femoral and profundofemoral endarterectomy with vein patch angioplasty, and left femoral to above-knee popliteal artery bypass graft with reversed ipsilateral translocated saphenous vein on 01-27-19 by Dr. Trula Slade for left foot ulcer.  The patient was admitted to the hospital the prior week for likely seizures.  He was found to have a left fifth metatarsal wound with possible osteomyelitis.  He was treated with antibiotics.  He underwent angiography which revealed an occluded superficial femoral artery with reconstitution of the above-knee popliteal artery.  He also had a high-grade profundus stenosis at the first major bifurcation. Also noted on angiography was no evidence of renal artery stenosis.  Infrarenal abdominal aortic aneurysm is identified.  Bilateral common and external iliac arteries are small in caliber but patent without stenosis.  He returns today for post operative follow up.    [facility] seems to be his HH, dresses his left foot wound twice/week. Pt and his son with him states that his left foot wound is improving a great deal. Pt is also seeing Dr. Sharol Given re his left foot wound.   Pt denies fever or chills, denies dyspnea or chest pain. He states he is elevating his legs often.   Diabetic: Yes, A1C was 10.8 on 01-17-19, uncontrolled, he states that his PCP is helping him manage his DM Tobacco use: former smoker, quit in 2011, smoked x 40 years   Pt meds include: Statin :Yes Betablocker: No ASA: Yes Other anticoagulants/antiplatelets: no  Past Medical History:  Diagnosis Date  . AAA (abdominal aortic aneurysm) ([place])   . Anxiety   . Arthritis   . CHF (congestive heart failure) ([place])   . COPD (chronic obstructive pulmonary  disease) ([place])   . Depression   . DM2 (diabetes mellitus, type 2) ([place])   . GERD (gastroesophageal reflux disease)   . HTN (hypertension)   . Hypercholesteremia   . Myocardial infarction ([place])    "light" heart attack 15 years ago (per pt 01/26/19)  . Peripheral vascular disease ([place])   . Seizure [facility])    one time in Oct. 2020  . Sleep apnea    no cpap    Social History Social History   Tobacco Use  . Smoking status: Former Smoker    Packs/day: 1.00    Years: 40.00    Pack years: 40.00    Types: Cigarettes    Quit date: 04/01/2009    Years since quitting: 9.8  . Smokeless tobacco: Never Used  Substance Use Topics  . Alcohol use: Not Currently  . Drug use: Never    Family History History reviewed. No pertinent family history.  Past Surgical History:  Procedure Laterality Date  . ABDOMINAL AORTOGRAM W/LOWER EXTREMITY N/A 01/19/2019   Procedure: ABDOMINAL AORTOGRAM W/LOWER EXTREMITY;  Surgeon: Serafina Mitchell, MD;  Location: [place] [facility];  Service: Cardiovascular;  Laterality: N/A;  . APPENDECTOMY    . ENDARTERECTOMY FEMORAL Left 01/27/2019   Procedure: ENDARTERECTOMY LEFT FEMORAL ARTERY AND PROFUNDA  ARTERY;  Surgeon: Serafina Mitchell, MD;  Location: [place];  Service: Vascular;  Laterality: Left;  . FEMORAL-POPLITEAL BYPASS GRAFT Left 01/27/2019   Procedure: BYPASS GRAFT FEMORAL-POPLITEAL ARTERY;  Surgeon: Serafina Mitchell, MD;  Location: [place];  Service: Vascular;  Laterality: Left;  .  FINGER AMPUTATION     Left second and third fingers, work accident  . KNEE SURGERY Left   . LOWER EXTREMITY ANGIOGRAM  05/2008   Left SFA atherectomy with diamondback 2.25 utilizing distal protection device NAV6 placed in the left popliteal artery. Angioplasty of the left SFA with ATV balloon 6 x 40   . PATCH ANGIOPLASTY Left 01/27/2019   Procedure: Patch Angioplasty using Vein;  Surgeon: Serafina Mitchell, MD;  Location: [place];  Service: Vascular;  Laterality: Left;  . SHOULDER  SURGERY Right   . VEIN HARVEST Left 01/27/2019   Procedure: Left Saphenous Vein Harvest;  Surgeon: Serafina Mitchell, MD;  Location: [place];  Service: Vascular;  Laterality: Left;    No Known Allergies  Current Outpatient Medications  Medication Sig Dispense Refill  . amLODipine (NORVASC) 10 MG tablet Take 1 tablet (10 mg total) by mouth daily. 30 tablet 0  . aspirin EC 81 MG EC tablet Take 1 tablet (81 mg total) by mouth daily. 30 tablet 0  . atorvastatin (LIPITOR) 40 MG tablet Take 1 tablet (40 mg total) by mouth daily at 6 PM. 30 tablet 0  . cefTRIAXone (ROCEPHIN) IVPB Inject 2 g into the vein daily. Indication:  Osteomyelitis  Last Day of Therapy:  02/28/2019 Labs - Once weekly:  CBC/D and BMP, Labs - Every other week:  ESR and CRP 39 Units 0  . diflunisal (DOLOBID) 500 MG TABS tablet Take 500 mg by mouth 2 (two) times daily as needed (for pain management- take with food or milk).    . DULoxetine (CYMBALTA) 60 MG capsule Take 60 mg by mouth daily.    . fenofibrate 160 MG tablet Take 160 mg by mouth daily.    . Gabapentin, Once-Daily, (GRALISE) 600 MG TABS Take 1,200 mg by mouth at bedtime.    . insulin aspart (NOVOLOG FLEXPEN) 100 UNIT/ML FlexPen Inject 6-8 Units into the skin See admin instructions. Inject 6-8 units into the skin two times a day (morning and early afternoon) as needed, per sliding scale: BGL 300-350 = 6 units; 351-400 = 8 units; >400 = CALL [facility]    . Insulin Degludec-Liraglutide (XULTOPHY) 100-3.6 UNIT-MG/ML SOPN Inject 22 Units into the skin daily before breakfast.     . levETIRAcetam (KEPPRA) 500 MG tablet Take 1 tablet (500 mg total) by mouth 2 (two) times daily. 60 tablet 0  . lisinopril (ZESTRIL) 40 MG tablet Take 40 mg by mouth daily.    Marland Kitchen morphine (MS CONTIN) 15 MG 12 hr tablet Take 15 mg by mouth every 12 (twelve) hours.    . mupirocin ointment (BACTROBAN) 2 % Apply 1 application topically See admin instructions. Apply topically as directed for wound care      . pantoprazole (PROTONIX) 40 MG tablet Take 40 mg by mouth daily before breakfast.    . Probiotic Product (ALIGN) 4 MG CAPS Take 4 mg by mouth daily.    . sodium chloride 1 g tablet Take 2 g by mouth See admin instructions. Take 2 grams by mouth in the morning and 2 grams at bedtime    . spironolactone (ALDACTONE) 25 MG tablet Take 25 mg by mouth daily.    . tamsulosin (FLOMAX) 0.4 MG CAPS capsule Take 0.4 mg by mouth at bedtime.    . traZODone (DESYREL) 100 MG tablet Take 100 mg by mouth at bedtime.    . vancomycin IVPB Inject 1,000 mg into the vein every 12 (twelve) hours. Indication:  Osteomyelitis  Last  Day of Therapy:  02/28/2019 Labs - Sunday/Monday:  CBC/D, BMP, and vancomycin trough. Labs - Thursday:  BMP and vancomycin trough Labs - Every other week:  ESR and CRP 78 Units 0   No current facility-administered medications for this visit.     ROS: See HPI for pertinent positives and negatives.   Physical Examination  Vitals:   02/17/19 0938  BP: (!) 155/77  Pulse: 84  Resp: 12  Temp: 98 F (36.7 C)  TempSrc: Temporal  SpO2: 97%  Weight: 224 lb (101.6 kg)  Height: 5' 9" (1.753 m)   Body mass index is 33.08 kg/m.  General: A&O x 3, WDWN, obese male accompanied by his son. Gait: limp, slight, wearing Darko shoe left foot HEENT: No gross abnormalities.  Pulmonary: Respirations are non labored Cardiac: regular rhythm  Radial pulses are palpable bilaterally   Adominal aortic pulse is not palpable                         VASCULAR EXAM: Extremities without ischemic changes, without Gangrene; with open wound left foot, dorsal aspect, dressing in place. 2+ non pitting edema left lower leg. Left groin incision healing with 2 small separated areas of incision, minimal erythema, no drainage. Left leg incision mostly healed                                                                                                          LE Pulses Right Left       FEMORAL   palpable    palpable        POPLITEAL  not palpable   not palpable       POSTERIOR TIBIAL  not palpable   3+ palpable        DORSALIS PEDIS      ANTERIOR TIBIAL faintly palpable  not palpable    Abdomen: soft, NT, no palpable masses. Skin: no rashes, no cellulitis, no ulcers noted.  Musculoskeletal: no muscle wasting or atrophy.  Neurologic: A&O X 3; appropriate affect, Sensation is normal except slightly diminished in left lower leg and foot; MOTOR FUNCTION:  moving all extremities equally, motor strength 5/5 throughout. Speech is fluent/normal. CN 2-12 intact. Psychiatric: Thought content is normal, mood appropriate for clinical situation.    DATA AAA Duplex (02-17-19): Abdominal Aorta Findings: +-----------+-------+----------+----------+--------+--------+--------+ Location   AP (cm)Trans (cm)PSV (cm/s)WaveformThrombusComments +-----------+-------+----------+----------+--------+--------+--------+ Proximal   2.85   2.80      66                                 +-----------+-------+----------+----------+--------+--------+--------+ Mid        2.70   2.66      87                                 +-----------+-------+----------+----------+--------+--------+--------+ Distal     4.34   4.62      10 7                                +-----------+-------+----------+----------+--------+--------+--------+  RT CIA Prox1.8    1.9       185                                +-----------+-------+----------+----------+--------+--------+--------+ LT CIA Prox1.6    1.5       220                                +-----------+-------+----------+----------+--------+--------+--------+ Summary: Abdominal Aorta: There is evidence of abnormal dilatation of the distal Abdominal aorta. The largest aortic measurement is 4.6 cm. Most proximal aorta obscured by bowel gas. Elevated velocities noted within the bilateral CIA with calcific plaque noted. Non-visualization of iliacs  distal to origin due to limitiations as listed above. Technically difficult exam with suboptimal views.    ASSESSMENT: Inocencio Roy is a 68 y.o. male who is s/p left common femoral and profundofemoral endarterectomy with vein patch angioplasty, and left femoral to above-knee popliteal artery bypass graft with reversed ipsilateral translocated saphenous vein on 01-27-19 by Dr. Trula Slade for left foot ulcer.  The patient was admitted to the hospital the prior week for likely seizures.  He was found to have a left fifth metatarsal wound with possible osteomyelitis.  He was treated with antibiotics.  He underwent angiography which revealed an occluded superficial femoral artery with reconstitution of the above-knee popliteal artery.  He also had a high-grade profundus stenosis at the first major bifurcation. Also noted on angiography was no evidence of renal artery stenosis.  Infrarenal abdominal aortic aneurysm is identified.  Bilateral common and external iliac arteries are small in caliber but patent without stenosis.  Pt states he has no claudication type symptoms in his legs with walking, and that his left foot wound id healing well.   Healing left groin and mostly healed left medial leg incision; left foot dorsal wound is healing per pt (dressing in place), wound managed by Dr. Sharol Given. 3+ palpable left PT pulse. All left toes are pink and warm with brisk capillary refill. Continue to wash left groin and left leg healing incisions daily with warm water and liquid Dial soap.   AAA duplex today shows 4.65 cm distal aortic AAA, asymptomatic.   His atherosclerotic risk factors include uncontrolled DM, 40 year history of smoking (quit in 2011), CHF, hypertension, and dslpidemia. He takes a daily 81 mg AS and a statin.   Left lower leg non pitting reperfusion edema:  Knee high compression hose after left foot wound heals. Elevation of feet above heart whenever sitting and overnight.    His  atherosclerotic risk factors include uncontrolled DM, 40 year history of smoking (quit in 2011), CAD, dyslipidemia, and hypertension. He takes a daily 81 mg ASA and a statin.    PLAN:  Based on the patient's vascular studies and examination, pt will return to clinic in 2 months with ABI's and left LE arterial duplex, 6 months for AAA duplex.  Gradually increase walking in a safe environment   I discussed in depth with the patient the nature of atherosclerosis, and emphasized the importance of maximal medical management including strict control of blood pressure, blood glucose, and lipid levels, obtaining regular exercise, and continued cessation of smoking.  The patient is aware that without maximal medical management the underlying atherosclerotic disease process will progress, limiting the benefit of any interventions.  The patient was given information about PAD including signs, symptoms,  treatment, what symptoms should prompt the patient to seek immediate medical care, and risk reduction measures to take.  Clemon Chambers, RN, MSN, FNP-C [facility] Phone: [phone]  Clinic MD: Laqueta Due  02/17/19 9:57 AM

## 2019-02-18 ENCOUNTER — Telehealth: Payer: Self-pay | Admitting: Orthopedic Surgery

## 2019-02-18 ENCOUNTER — Other Ambulatory Visit (HOSPITAL_COMMUNITY)
Admission: RE | Admit: 2019-02-18 | Discharge: 2019-02-18 | Disposition: A | Discharge status: Home / Self Care | Payer: No Typology Code available for payment source | Source: Ambulatory Visit | Attending: Orthopedic Surgery | Admitting: Orthopedic Surgery

## 2019-02-18 ENCOUNTER — Ambulatory Visit (INDEPENDENT_AMBULATORY_CARE_PROVIDER_SITE_OTHER): Payer: No Typology Code available for payment source | Admitting: Orthopedic Surgery

## 2019-02-18 ENCOUNTER — Other Ambulatory Visit: Payer: Self-pay

## 2019-02-18 ENCOUNTER — Encounter: Payer: Self-pay | Admitting: Orthopedic Surgery

## 2019-02-18 VITALS — Ht 69.0 in | Wt 224.0 lb

## 2019-02-18 DIAGNOSIS — Z20828 Contact with and (suspected) exposure to other viral communicable diseases: Secondary | ICD-10-CM | POA: Diagnosis not present

## 2019-02-18 DIAGNOSIS — I87333 Chronic venous hypertension (idiopathic) with ulcer and inflammation of bilateral lower extremity: Secondary | ICD-10-CM | POA: Diagnosis not present

## 2019-02-18 DIAGNOSIS — Z01812 Encounter for preprocedural laboratory examination: Secondary | ICD-10-CM | POA: Insufficient documentation

## 2019-02-18 DIAGNOSIS — M869 Osteomyelitis, unspecified: Secondary | ICD-10-CM

## 2019-02-18 DIAGNOSIS — L97919 Non-pressure chronic ulcer of unspecified part of right lower leg with unspecified severity: Secondary | ICD-10-CM

## 2019-02-18 DIAGNOSIS — L97929 Non-pressure chronic ulcer of unspecified part of left lower leg with unspecified severity: Secondary | ICD-10-CM

## 2019-02-18 DIAGNOSIS — L97524 Non-pressure chronic ulcer of other part of left foot with necrosis of bone: Secondary | ICD-10-CM | POA: Diagnosis not present

## 2019-02-18 LAB — SARS CORONAVIRUS 2 (TAT 6-24 HRS): SARS Coronavirus 2: NEGATIVE

## 2019-02-18 NOTE — Progress Notes (Signed)
Office Visit Note   Patient: Danny Carpenter           Date of Birth: 06-Dec-1950           MRN: [id number] Visit Date: 02/18/2019              Requested by: Melony Overly, MD [address] [place],  [place] [address] PCP: Melony Overly, MD  Chief Complaint  Patient presents with  . Left Foot - Pain      HPI: Patient is a 68 year old gentleman who is status post revascularization with vascular surgery.  Patient has persistent chronic osteomyelitis of the fifth metatarsal head left foot.  Patient was initially scheduled for surgery and this was canceled by his managed care plan through [facility] stating that they were not aware of his surgery.  Patient does have an abdominal aortic aneurysm currently measures 4.4 cm he states this has not changed.   Assessment & Plan: Visit Diagnoses:  1. Non-pressure chronic ulcer of other part of left foot with necrosis of bone ([place])   2. Chronic venous hypertension (idiopathic) with ulcer and inflammation of bilateral lower extremity ([facility])   3. Osteomyelitis of fifth toe of left foot ([place])     Plan: Plan: We will plan for fifth ray amputation of the left foot due to the chronic osteomyelitis.  Patient has improved vascular status and should heal the incision.  Discussed risks and benefits of surgery discussed the importance of nonweightbearing.  Discussed that with weightbearing the wound will pull apart and we will require further surgery.  Patient states he understands wished to proceed at this time.  Had a conversation with his managed care plan they feel they need to monitor him closely after surgery and do not feel like they can monitor him after surgery on Friday they have requested surgery on Monday we will try to set this up surgery will be an outpatient they will follow-up with the patient in the recovery room and planned their postoperative care.  Follow-Up Instructions: No follow-ups on file.   Ortho Exam  Patient is alert, oriented,  no adenopathy, well-dressed, normal affect, normal respiratory effort. Examination patient has a palpable pulse there is no redness no cellulitis he has a ulcer beneath the fifth metatarsal head which probes to bone.  Radiographs shows destructive bony changes with chronic osteomyelitis.  There is no cellulitis he does have venous stasis swelling but no venous ulcers.  Patient has uncontrolled type 2 diabetes with a hemoglobin A1c of 13.5. Imaging: No results found. No images are attached to the encounter.  Labs: Lab Results  Component Value Date   HGBA1C 13.5 (H) 01/18/2019   HGBA1C 10.8 (H) 01/17/2019   REPTSTATUS 01/18/2019 FINAL 01/17/2019   CULT  01/17/2019    NO GROWTH Performed at [facility], [place] [address], [place],  [address]      Lab Results  Component Value Date   ALBUMIN 3.8 01/27/2019    No results found for: MG No results found for: VD25OH  No results found for: PREALBUMIN CBC EXTENDED Latest Ref Rng & Units 01/28/2019 01/27/2019 01/27/2019  WBC 4.0 - 10.5 K/uL 10.8(H) 14.6(H) -  RBC 4.22 - 5.81 MIL/uL 3.47(L) 3.72(L) -  HGB 13.0 - 17.0 g/dL 10.1(L) 11.1(L) 10.9(L)  HCT 39.0 - 52.0 % 32.0(L) 33.5(L) 32.0(L)  PLT 150 - 400 K/uL 257 271 -  NEUTROABS 1.7 - 7.7 K/uL - - -  LYMPHSABS 0.7 - 4.0 K/uL - - -  Body mass index is 33.08 kg/m.  Orders:  No orders of the defined types were placed in this encounter.  No orders of the defined types were placed in this encounter.    Procedures: No procedures performed  Clinical Data: No additional findings.  ROS:  All other systems negative, except as noted in the HPI. Review of Systems  Objective: Vital Signs: Ht 5\' 9"  (1.753 m)   Wt 224 lb (101.6 kg)   BMI 33.08 kg/m   Specialty Comments:  No specialty comments available.  PMFS History: Patient Active Problem List   Diagnosis Date Noted  . PICC (peripherally inserted central catheter) in place 02/16/2019  . Encounter for  medication management 02/16/2019  . PAD (peripheral artery disease) ([place]) 01/27/2019  . Type 2 diabetes mellitus with other specified complication ([place])   . Subacute osteomyelitis of left foot ([place])   . PVD (peripheral vascular disease) ([place])   . CVA (cerebral vascular accident) ([place]) 01/17/2019  . Diabetic foot infection ([place]) 01/17/2019   Past Medical History:  Diagnosis Date  . AAA (abdominal aortic aneurysm) ([place])   . Anxiety   . Arthritis   . CHF (congestive heart failure) ([place])   . COPD (chronic obstructive pulmonary disease) ([place])   . Depression   . DM2 (diabetes mellitus, type 2) ([place])   . GERD (gastroesophageal reflux disease)   . HTN (hypertension)   . Hypercholesteremia   . Myocardial infarction ([place])    "light" heart attack 15 years ago (per pt 01/26/19)  . Peripheral vascular disease ([place])   . Seizure [facility])    one time in Oct. 2020  . Sleep apnea    no cpap    History reviewed. No pertinent family history.  Past Surgical History:  Procedure Laterality Date  . ABDOMINAL AORTOGRAM W/LOWER EXTREMITY N/A 01/19/2019   Procedure: ABDOMINAL AORTOGRAM W/LOWER EXTREMITY;  Surgeon: Serafina Mitchell, MD;  Location: [place] [facility];  Service: Cardiovascular;  Laterality: N/A;  . APPENDECTOMY    . ENDARTERECTOMY FEMORAL Left 01/27/2019   Procedure: ENDARTERECTOMY LEFT FEMORAL ARTERY AND PROFUNDA  ARTERY;  Surgeon: Serafina Mitchell, MD;  Location: [place];  Service: Vascular;  Laterality: Left;  . FEMORAL-POPLITEAL BYPASS GRAFT Left 01/27/2019   Procedure: BYPASS GRAFT FEMORAL-POPLITEAL ARTERY;  Surgeon: Serafina Mitchell, MD;  Location: [place];  Service: Vascular;  Laterality: Left;  . FINGER AMPUTATION     Left second and third fingers, work accident  . KNEE SURGERY Left   . LOWER EXTREMITY ANGIOGRAM  05/2008   Left SFA atherectomy with diamondback 2.25 utilizing distal protection device NAV6 placed in the left popliteal artery. Angioplasty of the left SFA with ATV balloon 6  x 40   . PATCH ANGIOPLASTY Left 01/27/2019   Procedure: Patch Angioplasty using Vein;  Surgeon: Serafina Mitchell, MD;  Location: [place];  Service: Vascular;  Laterality: Left;  . SHOULDER SURGERY Right   . VEIN HARVEST Left 01/27/2019   Procedure: Left Saphenous Vein Harvest;  Surgeon: Serafina Mitchell, MD;  Location: [facility] OR;  Service: Vascular;  Laterality: Left;   Social History   Occupational History  . Occupation: Disabled Administrator  Tobacco Use  . Smoking status: Former Smoker    Packs/day: 1.00    Years: 40.00    Pack years: 40.00    Types: Cigarettes    Quit date: 04/01/2009    Years since quitting: 9.8  . Smokeless tobacco: Never Used  Substance and Sexual Activity  .  Alcohol use: Not Currently  . Drug use: Never  . Sexual activity: Not on file

## 2019-02-18 NOTE — Telephone Encounter (Signed)
02/18/2019 ov note faxed to [facility],attn: Clarisa Fling [phone]

## 2019-02-19 ENCOUNTER — Encounter (HOSPITAL_COMMUNITY): Payer: Self-pay | Admitting: Vascular Surgery

## 2019-02-19 ENCOUNTER — Other Ambulatory Visit: Payer: Self-pay

## 2019-02-19 DIAGNOSIS — I779 Disorder of arteries and arterioles, unspecified: Secondary | ICD-10-CM

## 2019-02-19 NOTE — Progress Notes (Signed)
02/19/2019 12:25 PM   Labs received from [facility]:   Vanc troughs have been between 12-18; creatinine 1 - 1.10  CRP remains elevated at 18 (which has been a bit worse since initiation of IV antibiotics)  A1C is poor at 9.5 %  Of note I see that Dobie is due for amputation 02/22/19.   Will notify home health team to discontinue antibiotics 11/25 considering this will be the curative treatment for his osteomyelitis; unless Dr. Sharol Given encounters any other findings that would be concerning for deeper involvement.   He can cancel his follow up appointment as currently scheduled that day.

## 2019-02-19 NOTE — Progress Notes (Signed)
Anesthesia Chart Review: Danny Carpenter   Case: [id number] Date/Time: 02/22/19 1115   Procedure: LEFT FOOT 5TH RAY AMPUTATION (Left )   Anesthesia type: Choice   Pre-op diagnosis: Osteomyelitis Left 5th Toe   Location: [facility] OR ROOM 06 / [place] [facility]   Surgeon: Newt Minion, MD      DISCUSSION: Patient is a 68 year old male scheduled for the above procedure.   History includes former smoker (quit 04/01/09), COPD, HTN, PAD (s/p left CFA/profundofemeroal endarterectomy, left FPBG using GSV 01/27/19), hypercholesterolemia, DM2, GERD, OSA (no CPAP), AAA (4.6 cm 01/2019 [place]), CVA (remote lacunar infarct left cerebellum 10//18/20 MRI), seizure (01/16/19), chronic combined systolic and diastolic CHF, MI ("light" heart attack ~ 15 years ago; 2015 echo findings suggestive of prior inferior-posterior infarct; no significant perfusion defects 05/2016 nuclear stress test).  - Admission [facility] 01/27/19-01/29/19 for left CFA and profundofemoral endarterectomy, left FPBG. Had already been receiving vancomycin and ceftriaxone via PICC for left foot infection/osteomyelitis. Discharged with ongoing antibiotics through 02/28/19 and local wound care. (It looks like he was discharged on ASA and not Plavix.)   - Admission to [facility] 01/16/19-01/20/19 following transfer from [facility] for altered mental status. He apparently began tremoring all over and had urinary incontinence. He became aphasic. Notes indicate that he was febrile with temperature of 104, tachycardic, and hypertensive (BP 219/91). Initial head CT negative and he was treated with IV tPA. Brain MRI showed no acute abnormality, old left cerebellar infarct. LICA stenosis [id number] (by CTA, only mild at bifurcation per [place]). No seizures on EEG, but neurology suspected he liked had a generalized tonic clonic seizure at presentation for suspected CVA. He was started on Plavix and Keppra. Also with left foot diabetic infection and diagnosed with PAD. He was treated with IV  antibiotics per ID and local wound care. Outpatient left FPBG planned. He had a preoperative cardiology evaluation by Danny Carnes, MD during that admission. Although likely with CAD, he was without ischemic symptoms or volume overload. Echo showed normal LVEF and wall motion. No additional cardiac testing recommended prior to his FPBG procedure.    - PAT RN phone interview on 02/19/19 revealed that Danny Carpenter is no longer on Keppra or amlodipine. His prescriptions and medical care is coordinated through [facility] in [place] which is a Financial risk analyst. He reported not receiving these prescriptions. Danny Amor, RN contacted  Danny Schlein, NP with Danny Carpenter. No plans to resume these currently as BP has not been elevated (BP 101/70) and no recurrent neurologic events off medication for several weeks now. (See Progress Note by Danny Amor, RN.). Dr. Sharol Given coordinated surgery with his 40 team--they had requested surgery on a Monday (and not a Friday), so they could monitor him more closely during the week.   He is a same day work-up, so further evaluation by his anesthesia team on the day of surgery. 02/18/19 COVID-19 test negative.    PROVIDERS: Danny Overly, MD is PCP with [facility] a local P[facility] ([facility] [place] [facility] in [place].  Danny Mitchell, MD is vascular surgeon  - Most recently he was seen by cardiologist Danny Carnes, MD on 01/19/19 for preoperative evaluation. Previously he was followed by Danie Binder, MD ([place]), last in 2018.   Danny Hawking, MD is neurologist. See during 12/2018 admission.    LABS: He is for updated labs on arrival. Last labs include: Lab Results  Component Value  Date   WBC 10.8 (H) 01/28/2019   HGB 10.1 (L) 01/28/2019   HCT 32.0 (L) 01/28/2019   PLT 257 01/28/2019   ALT 19 01/27/2019   AST 20 01/27/2019   NA 131 (L) 01/28/2019   K 4.4  01/28/2019   CL 97 (L) 01/28/2019   CREATININE 1.06 01/28/2019   BUN 18 01/28/2019   CO2 25 01/28/2019   INR 1.0 01/27/2019   HGBA1C 13.5 (H) 01/18/2019  A1c 13.5% on 01/18/19, although oddly A1c 10.8% on 01/17/19    OTHER: EEG 01/17/19: IMPRESSION: This study issuggestive of non specific cortical dysfunction in left temporal region.No seizures or epileptiform discharges were seen throughout the recording.   IMAGES: 1V CXR 01/17/19: IMPRESSION: Improved lung volumes and aeration from radiograph yesterday with residual atelectasis or scarring at th  e right lung base. Aortic Atherosclerosis (ICD10-I70.0).   EKG:  EKG 01/17/19:  Right and left arm electrode reversal, interpretation assumes no reversal Sinus or ectopic atrial rhythm Multiple ventricular premature complexes Prolonged PR interval Probable lateral infarct, age indeterminate Baseline wander in lead(s) V4 Confirmed by Merrily Pew [phone]) on 01/17/2019 1:29:44 AM  EKG 01/16/19 [facility], scanned under Media tab, Correspondence, 01/27/19):  Sinus tachycardia at 112 bpm First degree AV block Left axis deviation Left ventricular hypertrophy with repolarization abnormality    CV: US Abdominal Aorta/AAA 02/17/19: Summary: - Abdominal Aorta: There is evidence of abnormal dilatation of the distal Abdominal aorta. The largest aortic measurement is 4.6 cm. - Most proximal aorta obscured by bowel gas. Elevated velocities noted within the bilateral CIA with calcific plaque noted. Non-visualization of iliacs distal to origin due to limitiations as listed above. - Technically difficult exam with suboptimal views.    Echo 01/17/19: IMPRESSIONS 1. Left ventricular ejection fraction, by visual estimation, is 55 to 60%. The left ventricle has normal function. Normal left ventricular size. There is no left ventricular hypertrophy. 2. Frequent PVCs. No source of cardiac embolism detected. 3.  Definity contrast agent was given IV to delineate the left ventricular endocardial borders. 4. Left ventricular diastolic Doppler parameters are consistent with impaired relaxation pattern of LV diastolic filling. 5. No LV thrombus seen contrast imaging. 6. Global right ventricle has normal systolic function.The right ventricular size is normal. No increase in right ventricular wall thickness. 7. Left atrial size was mildly dilated. 8. Right atrial size was normal. 9. Mild aortic valve annular calcification. 10. The mitral valve is degenerative. Trace mitral valve regurgitation. 11. The tricuspid valve is grossly normal. Tricuspid valve regurgitation is trivial. 12. The aortic valve is tricuspid Aortic valve regurgitation was not visualized by color flow Doppler. Structurally normal aortic valve, with no evidence of sclerosis or stenosis. 13. There is Mild thickening of the aortic valve. 14. The pulmonic valve was grossly normal. Pulmonic valve regurgitation is trivial by color flow Doppler. 15. Mild plaque invoving the ascending aorta. 16. TR signal is inadequate for assessing pulmonary artery systolic pressure. 17. The inferior vena cava is normal in size with greater than 50% respiratory variability, suggesting right atrial pressure of 3 mmHg. (Comparison: 04/20/13: LVEF 45-50%, basal and mid inferoposterior segmental thinning and akinesis; 01/11/14: LVEF 50-55%, basal inferoposterior akinesis)   Carotid US 01/17/19: Summary: Right Carotid: Velocities in the right ICA are consistent with a 1-39% stenosis. Left Carotid: Velocities in the left ICA are consistent with a 1-39% stenosis. Vertebrals: Bilateral vertebral arteries demonstrate antegrade flow. Subclavians: Normal flow hemodynamics were seen in bilateral subclavian arteries.   CTA neck 01/16/19 [facility], report in PACS): IMPRESSION: 1. Negative for large vessel occlusion, but positive for: - near occlusion of  the right vertebral artery V4 segment due to high grade stenosis from calcified plague. - high grade Left ICA origin stenosis due to bulky calcified plaque numerically estimated at 74%. - moderate distal left vertebral artery stenosis. - up to moderate grade right ICA siphon stenosis at the anterior genu. 2. Widespread atherosclerosis elsewhere in the head and neck but no other hemodynamically significant stenosis. 3. Aortic Atherosclerosis.    Nuclear stress test 06/11/16: Final report not viewable in [facility], but according to 01/19/19 cardiology consult with Dr. Harrington Challenger, "Perf Stress RH 05/2016: Dilated LV, diffuse hypo, EF 36%, but no significant perf defect, No stress induced hypoperfusion". - (Previous NST 01/03/16 at [facility] showed small regions of decreased radiotracer uptake at rest in the apical anteroseptal and inferior wall may represent areas of prior infarct/scarring, but evaluation for reversible ischemia and cardiac wall motion could not be obtained secondary to patient declining the stress portion of the exam and stress echo was felt contraindicated given AAA history.)   Cardiac event monitor 02/26/16-03/17/16 [facility] CE): Indication: syncope Baseline Rhythm: Sinus Rhythm with PVC's 81 BPM Rhythm Findings: 1. Ventricular: occasional PVCs 2. Supraventricular: rare PACs 3. Bradyarrhythmias and Pauses: None Symptoms reported: None CONCLUSIONS: 1. Stable event monitor.  2. Mild arrhythmia as described 3. Symptoms were not reported  4. Symptoms were not correlated with arrhythmias   Past Medical History:  Diagnosis Date  . AAA (abdominal aortic aneurysm) ([place])   . Anxiety   . Arthritis   . CHF (congestive heart failure) ([place])   . COPD (chronic obstructive pulmonary disease) ([place])   . Depression   . DM2 (diabetes mellitus, type 2) ([place])   . GERD (gastroesophageal reflux disease)   . HTN (hypertension)   . Hypercholesteremia   . Myocardial  infarction ([place])    "light" heart attack 15 years ago (per pt 01/26/19)  . Peripheral vascular disease ([place])   . Seizure [facility])    one time in Oct. 2020  . Sleep apnea    no cpap    Past Surgical History:  Procedure Laterality Date  . ABDOMINAL AORTOGRAM W/LOWER EXTREMITY N/A 01/19/2019   Procedure: ABDOMINAL AORTOGRAM W/LOWER EXTREMITY;  Surgeon: Danny Mitchell, MD;  Location: [place] CV LAB;  Service: Cardiovascular;  Laterality: N/A;  . APPENDECTOMY    . ENDARTERECTOMY FEMORAL Left 01/27/2019   Procedure: ENDARTERECTOMY LEFT FEMORAL ARTERY AND PROFUNDA  ARTERY;  Surgeon: Danny Mitchell, MD;  Location: [place];  Service: Vascular;  Laterality: Left;  . FEMORAL-POPLITEAL BYPASS GRAFT Left 01/27/2019   Procedure: BYPASS GRAFT FEMORAL-POPLITEAL ARTERY;  Surgeon: Danny Mitchell, MD;  Location: [place];  Service: Vascular;  Laterality: Left;  . FINGER AMPUTATION     Left second and third fingers, work accident  . KNEE SURGERY Left   . LOWER EXTREMITY ANGIOGRAM  05/2008   Left SFA atherectomy with diamondback 2.25 utilizing distal protection device NAV6 placed in the left popliteal artery. Angioplasty of the left SFA with ATV balloon 6 x 40   . PATCH ANGIOPLASTY Left 01/27/2019   Procedure: Patch Angioplasty using Vein;  Surgeon: Danny Mitchell, MD;  Location: [place];  Service: Vascular;  Laterality: Left;  . SHOULDER SURGERY Right   . VEIN HARVEST Left 01/27/2019   Procedure: Left Saphenous Vein Harvest;  Surgeon: Danny Mitchell, MD;  Location: [place];  Service: Vascular;  Laterality:  Left;    MEDICATIONS: No current facility-administered medications for this encounter.    Marland Kitchen amLODipine (NORVASC) 10 MG tablet  . aspirin EC 81 MG EC tablet  . atorvastatin (LIPITOR) 40 MG tablet  . cefTRIAXone (ROCEPHIN) IVPB  . diflunisal (DOLOBID) 500 MG TABS tablet  . DULoxetine (CYMBALTA) 60 MG capsule  . fenofibrate 160 MG tablet  . Gabapentin, Once-Daily, (GRALISE) 600 MG TABS  .  insulin aspart (NOVOLOG FLEXPEN) 100 UNIT/ML FlexPen  . Insulin Degludec-Liraglutide (XULTOPHY) 100-3.6 UNIT-MG/ML SOPN  . levETIRAcetam (KEPPRA) 500 MG tablet  . lisinopril (ZESTRIL) 40 MG tablet  . morphine (MS CONTIN) 15 MG 12 hr tablet  . mupirocin ointment (BACTROBAN) 2 %  . pantoprazole (PROTONIX) 40 MG tablet  . Probiotic Product (ALIGN) 4 MG CAPS  . sodium chloride 1 g tablet  . spironolactone (ALDACTONE) 25 MG tablet  . tamsulosin (FLOMAX) 0.4 MG CAPS capsule  . traZODone (DESYREL) 100 MG tablet  . vancomycin IVPB  He is not currently on Keppra or amlodipine.   Myra Gianotti, PA-C Surgical Short Stay/Anesthesiology [facility] Phone [phone] [facility] Phone [phone] 02/19/2019 5:28 PM

## 2019-02-19 NOTE — Anesthesia Preprocedure Evaluation (Addendum)
Anesthesia Evaluation  Patient identified by MRN, date of birth, ID band Patient awake    Reviewed: Allergy & Precautions, H&P , NPO status , Patient's Chart, lab work & pertinent test results, reviewed documented beta blocker date and time   History of Anesthesia Complications (+) PONV  Airway Mallampati: II  TM Distance: >3 FB Neck ROM: Full    Dental no notable dental hx. (+) Teeth Intact, Dental Advisory Given   Pulmonary neg pulmonary ROS, former smoker,    Pulmonary exam normal breath sounds clear to auscultation       Cardiovascular hypertension, Pt. on medications and On Home Beta Blockers + Past MI and + Peripheral Vascular Disease   Rhythm:Regular Rate:Normal     Neuro/Psych Seizures -, Well Controlled,  Anxiety Depression    GI/Hepatic Neg liver ROS, GERD  Medicated and Controlled,  Endo/Other  diabetes, Insulin Dependent  Renal/GU negative Renal ROS  negative genitourinary   Musculoskeletal  (+) Arthritis , Osteoarthritis,    Abdominal   Peds  Hematology negative hematology ROS (+)   Anesthesia Other Findings   Reproductive/Obstetrics negative OB ROS                           Anesthesia Physical Anesthesia Plan  ASA: III  Anesthesia Plan: General   Post-op Pain Management:    Induction: Intravenous  PONV Risk Score and Plan: 2 and 4 or greater and Ondansetron, Midazolam and Treatment may vary due to age or medical condition  Airway Management Planned: LMA  Additional Equipment:   Intra-op Plan:   Post-operative Plan: Extubation in OR  Informed Consent: I have reviewed the patients History and Physical, chart, labs and discussed the procedure including the risks, benefits and alternatives for the proposed anesthesia with the patient or authorized representative who has indicated his/her understanding and acceptance.     Dental advisory given  Plan Discussed  with: CRNA  Anesthesia Plan Comments: (PAT note written 02/19/2019 by Myra Gianotti, PA-C. SAME DAY WORK-UP   )      Anesthesia Quick Evaluation

## 2019-02-19 NOTE — Progress Notes (Signed)
Ispoke with Mr Denk daughter, Danton Clap, she spoke to patient and gave medication update.When patient was discharged form [facility], 01/20/2019 he had instructions to take Keppra 500 mg 2 times a day. Mr Poch was also to start Amlodipine. Patient does not have either medication. Patient gets prescriptions at [facility] in [place]. I called [facility] and spoke to Jeanett Schlein, NP. Ms Ronnald Ramp get and she did not receive a prescription for Keppra. Ms Ronnald Ramp said that she would not want to start medication now, because loading dose is out of patient's system and it is a weekend and they would not be able to monitor patient. Ms Ronnald Ramp reports that Mr Igarashi has not a had any episodes of change in mental status or any seizure activity.  Ms Ronnald Ramp also reported that Mr. Kindschi blood pressure has been on the low side, today it was 101/70, 'we are monitoring his blood pressure and if blood pressure becomes elevated we will add medication then.'  Mr Visconti should be home from [facility] around 1430 and I will call to speak to him after that time.

## 2019-02-19 NOTE — Progress Notes (Signed)
I was unable to reach patient on the phone, I told to his daughter briefly regarding medications and informed her that I need to speak with patient. I called several times and no answer, I left a voice message on home number- confirmed #.  I instructed patient to stop taking Dobbid.    I instructed patient to not take Xultrophy . Sunday or Monday am.( I spoke with the diabetic cordinator and she gave me those  Instructions). I instructed patient to check CBG when his gets up Monday and every 2 hours until he arrives. I instructed Danny Carpenter if CBG is greater than 220 to take 1/2 of sliding scale Insulin. i Instructed patient if CBG is less than 70 to take drink 1/2 cup of a clear juice. Recheck CBG in 15 minutes then call pre- op desk at [phone] for further instructions.    I instructed the patient to arrive at [place] entrance at 0900 , register in the [place]. DO NOT eat or drink anything after midnight.  I instructed the patient to take the following medications in the am with just enough water to get them down: Amlodipine, Cymbalta, Fenofribrate, Protonix and if needed Morphine Contin.  I asked patient to shower, not wear any lotions, powders, cologne, jewelry, piercing, make-up or nail polish.  Wear clean clothes. Brush teeth. I informed patient that there will need to be a driver and someone to stay with him/her for the first 24 hours after surgery.   I instructed  patient to call [phone], in the am if there were any questions or problems.  I instructed patient to call me if he is able to by 1900  or call pre- surgery desk  Saturday. for any questions

## 2019-02-22 ENCOUNTER — Ambulatory Visit (HOSPITAL_COMMUNITY): Payer: No Typology Code available for payment source | Admitting: Physician Assistant

## 2019-02-22 ENCOUNTER — Other Ambulatory Visit: Payer: Self-pay

## 2019-02-22 ENCOUNTER — Ambulatory Visit (HOSPITAL_COMMUNITY)
Admission: RE | Admit: 2019-02-22 | Discharge: 2019-02-22 | Disposition: A | Discharge status: Home / Self Care | Payer: No Typology Code available for payment source | Attending: Orthopedic Surgery | Admitting: Orthopedic Surgery

## 2019-02-22 ENCOUNTER — Other Ambulatory Visit: Payer: Self-pay | Admitting: Physician Assistant

## 2019-02-22 ENCOUNTER — Encounter (HOSPITAL_COMMUNITY)
Admission: RE | Disposition: A | Discharge status: Home / Self Care | Payer: Self-pay | Source: Home / Self Care | Attending: Orthopedic Surgery

## 2019-02-22 ENCOUNTER — Inpatient Hospital Stay: Payer: No Typology Code available for payment source | Admitting: Family

## 2019-02-22 ENCOUNTER — Encounter (HOSPITAL_COMMUNITY): Payer: Self-pay

## 2019-02-22 DIAGNOSIS — M86272 Subacute osteomyelitis, left ankle and foot: Secondary | ICD-10-CM | POA: Diagnosis not present

## 2019-02-22 DIAGNOSIS — Z8673 Personal history of transient ischemic attack (TIA), and cerebral infarction without residual deficits: Secondary | ICD-10-CM | POA: Insufficient documentation

## 2019-02-22 DIAGNOSIS — Z7982 Long term (current) use of aspirin: Secondary | ICD-10-CM | POA: Insufficient documentation

## 2019-02-22 DIAGNOSIS — M869 Osteomyelitis, unspecified: Secondary | ICD-10-CM | POA: Diagnosis not present

## 2019-02-22 DIAGNOSIS — I700 Atherosclerosis of aorta: Secondary | ICD-10-CM | POA: Diagnosis not present

## 2019-02-22 DIAGNOSIS — G4730 Sleep apnea, unspecified: Secondary | ICD-10-CM | POA: Insufficient documentation

## 2019-02-22 DIAGNOSIS — I110 Hypertensive heart disease with heart failure: Secondary | ICD-10-CM | POA: Insufficient documentation

## 2019-02-22 DIAGNOSIS — Z79899 Other long term (current) drug therapy: Secondary | ICD-10-CM | POA: Insufficient documentation

## 2019-02-22 DIAGNOSIS — M1990 Unspecified osteoarthritis, unspecified site: Secondary | ICD-10-CM | POA: Diagnosis not present

## 2019-02-22 DIAGNOSIS — Z87891 Personal history of nicotine dependence: Secondary | ICD-10-CM | POA: Diagnosis not present

## 2019-02-22 DIAGNOSIS — L97524 Non-pressure chronic ulcer of other part of left foot with necrosis of bone: Secondary | ICD-10-CM | POA: Diagnosis not present

## 2019-02-22 DIAGNOSIS — K219 Gastro-esophageal reflux disease without esophagitis: Secondary | ICD-10-CM | POA: Insufficient documentation

## 2019-02-22 DIAGNOSIS — I6501 Occlusion and stenosis of right vertebral artery: Secondary | ICD-10-CM | POA: Diagnosis not present

## 2019-02-22 DIAGNOSIS — Z89022 Acquired absence of left finger(s): Secondary | ICD-10-CM | POA: Insufficient documentation

## 2019-02-22 DIAGNOSIS — I509 Heart failure, unspecified: Secondary | ICD-10-CM | POA: Diagnosis not present

## 2019-02-22 DIAGNOSIS — R000 Tachycardia, unspecified: Secondary | ICD-10-CM | POA: Diagnosis not present

## 2019-02-22 DIAGNOSIS — F329 Major depressive disorder, single episode, unspecified: Secondary | ICD-10-CM | POA: Diagnosis not present

## 2019-02-22 DIAGNOSIS — R569 Unspecified convulsions: Secondary | ICD-10-CM | POA: Diagnosis not present

## 2019-02-22 DIAGNOSIS — I714 Abdominal aortic aneurysm, without rupture: Secondary | ICD-10-CM | POA: Diagnosis not present

## 2019-02-22 DIAGNOSIS — R55 Syncope and collapse: Secondary | ICD-10-CM | POA: Diagnosis not present

## 2019-02-22 DIAGNOSIS — I252 Old myocardial infarction: Secondary | ICD-10-CM | POA: Insufficient documentation

## 2019-02-22 DIAGNOSIS — Z794 Long term (current) use of insulin: Secondary | ICD-10-CM | POA: Insufficient documentation

## 2019-02-22 DIAGNOSIS — E11621 Type 2 diabetes mellitus with foot ulcer: Secondary | ICD-10-CM | POA: Diagnosis not present

## 2019-02-22 DIAGNOSIS — J449 Chronic obstructive pulmonary disease, unspecified: Secondary | ICD-10-CM | POA: Diagnosis not present

## 2019-02-22 DIAGNOSIS — F419 Anxiety disorder, unspecified: Secondary | ICD-10-CM | POA: Insufficient documentation

## 2019-02-22 DIAGNOSIS — I87333 Chronic venous hypertension (idiopathic) with ulcer and inflammation of bilateral lower extremity: Secondary | ICD-10-CM | POA: Diagnosis not present

## 2019-02-22 DIAGNOSIS — I493 Ventricular premature depolarization: Secondary | ICD-10-CM | POA: Diagnosis not present

## 2019-02-22 DIAGNOSIS — E1169 Type 2 diabetes mellitus with other specified complication: Secondary | ICD-10-CM | POA: Insufficient documentation

## 2019-02-22 DIAGNOSIS — E1151 Type 2 diabetes mellitus with diabetic peripheral angiopathy without gangrene: Secondary | ICD-10-CM | POA: Diagnosis not present

## 2019-02-22 DIAGNOSIS — E7800 Pure hypercholesterolemia, unspecified: Secondary | ICD-10-CM | POA: Diagnosis not present

## 2019-02-22 HISTORY — DX: Other specified postprocedural states: Z98.890

## 2019-02-22 HISTORY — DX: Cerebral infarction, unspecified: I63.9

## 2019-02-22 HISTORY — DX: Nausea with vomiting, unspecified: R11.2

## 2019-02-22 HISTORY — PX: AMPUTATION: SHX166

## 2019-02-22 LAB — BASIC METABOLIC PANEL
Anion gap: 10 (ref 5–15)
BUN: 23 mg/dL (ref 8–23)
CO2: 24 mmol/L (ref 22–32)
Calcium: 8.6 mg/dL — ABNORMAL LOW (ref 8.9–10.3)
Chloride: 97 mmol/L — ABNORMAL LOW (ref 98–111)
Creatinine, Ser: 1.06 mg/dL (ref 0.61–1.24)
GFR calc Af Amer: >60 mL/min (ref >60)
GFR calc non Af Amer: >60 mL/min (ref >60)
Glucose, Bld: 288 mg/dL — ABNORMAL HIGH (ref 70–99)
Potassium: 4.2 mmol/L (ref 3.5–5.1)
Sodium: 131 mmol/L — ABNORMAL LOW (ref 135–145)

## 2019-02-22 LAB — GLUCOSE, CAPILLARY
Glucose-Capillary: 300 mg/dL — ABNORMAL HIGH (ref 70–99)
Glucose-Capillary: 258 mg/dL — ABNORMAL HIGH (ref 70–99)
Glucose-Capillary: 212 mg/dL — ABNORMAL HIGH (ref 70–99)

## 2019-02-22 LAB — CBC
HCT: 34.9 % — ABNORMAL LOW (ref 39.0–52.0)
Hemoglobin: 10.9 g/dL — ABNORMAL LOW (ref 13.0–17.0)
MCH: 28.9 pg (ref 26.0–34.0)
MCHC: 31.2 g/dL (ref 30.0–36.0)
MCV: 92.6 fL (ref 80.0–100.0)
Platelets: 186 10*3/uL (ref 150–400)
RBC: 3.77 MIL/uL — ABNORMAL LOW (ref 4.22–5.81)
RDW: 13.6 % (ref 11.5–15.5)
WBC: 5.4 10*3/uL (ref 4.0–10.5)
nRBC: 0 % (ref 0.0–0.2)

## 2019-02-22 SURGERY — AMPUTATION, FOOT, RAY
Anesthesia: General | Laterality: Left

## 2019-02-22 MED ORDER — CEFAZOLIN SODIUM 1 G IJ SOLR
INTRAMUSCULAR | Status: AC
  Filled 2019-02-22: qty 20

## 2019-02-22 MED ORDER — ONDANSETRON HCL 4 MG/2ML IJ SOLN
INTRAMUSCULAR | Status: DC | PRN
  Administered 2019-02-22: 4 mg via INTRAVENOUS

## 2019-02-22 MED ORDER — OXYCODONE HCL 5 MG PO TABS
ORAL_TABLET | ORAL | Status: AC
  Filled 2019-02-22: qty 1

## 2019-02-22 MED ORDER — INSULIN ASPART 100 UNIT/ML ~~LOC~~ SOLN
8.0000 [IU] | Freq: Once | SUBCUTANEOUS | Status: AC
  Administered 2019-02-22: 8 [IU] via SUBCUTANEOUS
  Filled 2019-02-22: qty 1

## 2019-02-22 MED ORDER — MIDAZOLAM HCL 2 MG/2ML IJ SOLN
INTRAMUSCULAR | Status: AC
  Filled 2019-02-22: qty 2

## 2019-02-22 MED ORDER — PROPOFOL 10 MG/ML IV BOLUS
INTRAVENOUS | Status: DC | PRN
  Administered 2019-02-22: 120 mg via INTRAVENOUS

## 2019-02-22 MED ORDER — LIDOCAINE 2% (20 MG/ML) 5 ML SYRINGE
INTRAMUSCULAR | Status: DC | PRN
  Administered 2019-02-22: 60 mg via INTRAVENOUS

## 2019-02-22 MED ORDER — LIDOCAINE 2% (20 MG/ML) 5 ML SYRINGE
INTRAMUSCULAR | Status: AC
  Filled 2019-02-22: qty 5

## 2019-02-22 MED ORDER — FENTANYL CITRATE (PF) 100 MCG/2ML IJ SOLN
INTRAMUSCULAR | Status: AC
  Filled 2019-02-22: qty 2

## 2019-02-22 MED ORDER — 0.9 % SODIUM CHLORIDE (POUR BTL) OPTIME
TOPICAL | Status: DC | PRN
  Administered 2019-02-22: 1000 mL

## 2019-02-22 MED ORDER — LACTATED RINGERS IV SOLN
INTRAVENOUS | Status: DC
  Administered 2019-02-22: 11:00:00 via INTRAVENOUS

## 2019-02-22 MED ORDER — MIDAZOLAM HCL 5 MG/5ML IJ SOLN
INTRAMUSCULAR | Status: DC | PRN
  Administered 2019-02-22: 2 mg via INTRAVENOUS

## 2019-02-22 MED ORDER — OXYCODONE HCL 5 MG PO TABS
5.0000 mg | ORAL_TABLET | Freq: Once | ORAL | Status: AC
  Administered 2019-02-22: 5 mg via ORAL

## 2019-02-22 MED ORDER — PROPOFOL 10 MG/ML IV BOLUS
INTRAVENOUS | Status: AC
  Filled 2019-02-22: qty 20

## 2019-02-22 MED ORDER — FENTANYL CITRATE (PF) 100 MCG/2ML IJ SOLN
25.0000 ug | INTRAMUSCULAR | Status: DC | PRN
  Administered 2019-02-22: 50 ug via INTRAVENOUS

## 2019-02-22 MED ORDER — ACETAMINOPHEN 500 MG PO TABS
1000.0000 mg | ORAL_TABLET | Freq: Once | ORAL | Status: AC
  Administered 2019-02-22: 1000 mg via ORAL
  Filled 2019-02-22: qty 2

## 2019-02-22 MED ORDER — CEFAZOLIN SODIUM-DEXTROSE 2-3 GM-%(50ML) IV SOLR
INTRAVENOUS | Status: DC | PRN
  Administered 2019-02-22: 2 g via INTRAVENOUS

## 2019-02-22 MED ORDER — FENTANYL CITRATE (PF) 250 MCG/5ML IJ SOLN
INTRAMUSCULAR | Status: AC
  Filled 2019-02-22: qty 5

## 2019-02-22 SURGICAL SUPPLY — 32 items
BLADE SAW SGTL MED 73X18.5 STR (BLADE) IMPLANT
BLADE SURG 21 STRL SS (BLADE) ×3 IMPLANT
BNDG COHESIVE 1X5 TAN STRL LF (GAUZE/BANDAGES/DRESSINGS) ×2 IMPLANT
BNDG COHESIVE 4X5 TAN STRL (GAUZE/BANDAGES/DRESSINGS) ×3 IMPLANT
BNDG GAUZE ELAST 4 BULKY (GAUZE/BANDAGES/DRESSINGS) ×3 IMPLANT
COVER SURGICAL LIGHT HANDLE (MISCELLANEOUS) ×6 IMPLANT
COVER WAND RF STERILE (DRAPES) ×3 IMPLANT
DRAPE U-SHAPE 47X51 STRL (DRAPES) ×6 IMPLANT
DRSG ADAPTIC 3X8 NADH LF (GAUZE/BANDAGES/DRESSINGS) ×3 IMPLANT
DRSG PAD ABDOMINAL 8X10 ST (GAUZE/BANDAGES/DRESSINGS) ×6 IMPLANT
DURAPREP 26ML APPLICATOR (WOUND CARE) ×3 IMPLANT
ELECT REM PT RETURN 9FT ADLT (ELECTROSURGICAL) ×3
ELECTRODE REM PT RTRN 9FT ADLT (ELECTROSURGICAL) ×1 IMPLANT
GAUZE SPONGE 4X4 12PLY STRL (GAUZE/BANDAGES/DRESSINGS) ×3 IMPLANT
GLOVE BIOGEL PI IND STRL 9 (GLOVE) ×1 IMPLANT
GLOVE BIOGEL PI INDICATOR 9 (GLOVE) ×2
GLOVE SURG ORTHO 9.0 STRL STRW (GLOVE) ×3 IMPLANT
GOWN STRL REUS W/ TWL XL LVL3 (GOWN DISPOSABLE) ×2 IMPLANT
GOWN STRL REUS W/TWL XL LVL3 (GOWN DISPOSABLE) ×6
KIT BASIN OR (CUSTOM PROCEDURE TRAY) ×3 IMPLANT
KIT TURNOVER KIT B (KITS) ×3 IMPLANT
NS IRRIG 1000ML POUR BTL (IV SOLUTION) ×3 IMPLANT
PACK ORTHO EXTREMITY (CUSTOM PROCEDURE TRAY) ×3 IMPLANT
PAD ABD 8X10 STRL (GAUZE/BANDAGES/DRESSINGS) ×2 IMPLANT
PAD ARMBOARD 7.5X6 YLW CONV (MISCELLANEOUS) ×6 IMPLANT
STOCKINETTE IMPERVIOUS LG (DRAPES) IMPLANT
SUT ETHILON 2 0 FS 18 (SUTURE) ×2 IMPLANT
SUT ETHILON 2 0 PSLX (SUTURE) ×3 IMPLANT
TOWEL GREEN STERILE (TOWEL DISPOSABLE) ×3 IMPLANT
TUBE CONNECTING 12'X1/4 (SUCTIONS) ×1
TUBE CONNECTING 12X1/4 (SUCTIONS) ×2 IMPLANT
YANKAUER SUCT BULB TIP NO VENT (SUCTIONS) ×3 IMPLANT

## 2019-02-22 NOTE — Progress Notes (Signed)
Wasted 50 mcg fentanyl with [facility] administrator .

## 2019-02-22 NOTE — Transfer of Care (Signed)
Immediate Anesthesia Transfer of Care Note  Patient: Danny Carpenter  Procedure(s) Performed: LEFT FOOT 5TH RAY AMPUTATION (Left )  Patient Location: PACU  Anesthesia Type:General  Level of Consciousness: drowsy and patient cooperative  Airway & Oxygen Therapy: Patient Spontanous Breathing and Patient connected to nasal cannula oxygen  Post-op Assessment: Report given to RN, Post -op Vital signs reviewed and stable and Patient moving all extremities  Post vital signs: Reviewed and stable  Last Vitals:  Vitals Value Taken Time  BP 157/62 02/22/19 1153  Temp    Pulse 68 02/22/19 1154  Resp 13 02/22/19 1154  SpO2 100 % 02/22/19 1154  Vitals shown include unvalidated device data.  Last Pain:  Vitals:   02/22/19 1019  TempSrc:   PainSc: 0-No pain      Patients Stated Pain Goal: 3 ([id number])  Complications: No apparent anesthesia complications

## 2019-02-22 NOTE — Anesthesia Procedure Notes (Signed)
Procedure Name: LMA Insertion Date/Time: 02/22/2019 11:28 AM Performed by: Moshe Salisbury, CRNA Pre-anesthesia Checklist: Patient identified, Emergency Drugs available, Suction available and Patient being monitored Patient Re-evaluated:Patient Re-evaluated prior to induction Oxygen Delivery Method: Circle System Utilized Preoxygenation: Pre-oxygenation with 100% oxygen Induction Type: IV induction Ventilation: Mask ventilation without difficulty LMA: LMA inserted LMA Size: 5.0 Number of attempts: 1 Placement Confirmation: positive ETCO2 Tube secured with: Tape Dental Injury: Teeth and Oropharynx as per pre-operative assessment

## 2019-02-22 NOTE — Progress Notes (Signed)
Orthopedic Tech Progress Note Patient Details:  Danny Carpenter July 05, 1950 [id number]  Ortho Devices Type of Ortho Device: Postop shoe/boot Ortho Device/Splint Interventions: Application   Post Interventions Patient Tolerated: Well Instructions Provided: Care of device   Danny Carpenter 02/22/2019, 12:28 PM

## 2019-02-22 NOTE — Op Note (Signed)
02/22/2019  12:09 PM  PATIENT:  Danny Carpenter    PRE-OPERATIVE DIAGNOSIS:  Osteomyelitis Left 5th Toe  POST-OPERATIVE DIAGNOSIS:  Same  PROCEDURE:  LEFT FOOT 5TH RAY AMPUTATION Local tissue rearrangement for wound closure 2 x 9 cm.  SURGEON:  Newt Minion, MD  PHYSICIAN ASSISTANT:None ANESTHESIA:   General  PREOPERATIVE INDICATIONS:  [name] is a  68 y.o. male with a diagnosis of Osteomyelitis Left 5th Toe who failed conservative measures and elected for surgical management.    The risks benefits and alternatives were discussed with the patient preoperatively including but not limited to the risks of infection, bleeding, nerve injury, cardiopulmonary complications, the need for revision surgery, among others, and the patient was willing to proceed.  OPERATIVE IMPLANTS: None  @ENCIMAGES @  OPERATIVE FINDINGS: Good petechial bleeding no deep abscess.  OPERATIVE PROCEDURE: Patient was brought to the operating room underwent a general anesthetic.  After adequate levels anesthesia were obtained patient's left lower extremity was prepped using DuraPrep draped into a sterile field a timeout was called.  A racquet incision was made around the fifth ray left foot to include the ulcerative tissue and the metatarsal.  This was all resected in one block of tissue.  The metatarsal was beveled plantarly at the base.  Electrocautery was used for hemostasis the wound was irrigated with normal saline.  There is no evidence of infection at the margins.  Local tissue rearrangement was used to close the wound that was 2 x 9 cm.  2-0 nylon was used.  A sterile dressing was applied patient was extubated taken the PACU in stable condition.   DISCHARGE PLANNING:  Antibiotic duration: Would continue antibiotics for 3 days.  Weightbearing: Ideally nonweightbearing on the left touchdown weightbearing is okay  Pain medication: Patient is currently on a pain management program  Dressing care/ Wound  VAC: Follow-up in the office in 1 week to change the dressing  Ambulatory devices: Wheelchair walker or kneeling scooter  Discharge to: Home patient to go to [facility] today.  Follow-up: In the office 1 week post operative.

## 2019-02-22 NOTE — H&P (Signed)
Danny Carpenter is an 68 y.o. male.   Chief Complaint: Left foot Ulcer HPI:  HPI: Patient is a 68 year old gentleman who is status post revascularization with vascular surgery.  Patient has persistent chronic osteomyelitis of the fifth metatarsal head left foot.  Patient was initially scheduled for surgery and this was canceled by his managed care plan through [facility] stating that they were not aware of his surgery.  Patient does have an abdominal aortic aneurysm currently measures 4.4 cm he states this has not changed.  Past Medical History:  Diagnosis Date  . AAA (abdominal aortic aneurysm) ([place])   . Anxiety   . Arthritis   . CHF (congestive heart failure) ([place])   . COPD (chronic obstructive pulmonary disease) ([place])   . CVA (cerebral vascular accident) ([place])   . Depression   . DM2 (diabetes mellitus, type 2) ([place])   . GERD (gastroesophageal reflux disease)   . HTN (hypertension)   . Hypercholesteremia   . Myocardial infarction ([place])    "light" heart attack 15 years ago (per pt 01/26/19)  . Peripheral vascular disease ([place])   . PONV (postoperative nausea and vomiting)   . Seizure [facility])    one time in Oct. 2020  . Sleep apnea    no cpap    Past Surgical History:  Procedure Laterality Date  . ABDOMINAL AORTOGRAM W/LOWER EXTREMITY N/A 01/19/2019   Procedure: ABDOMINAL AORTOGRAM W/LOWER EXTREMITY;  Surgeon: Serafina Mitchell, MD;  Location: [place] [facility];  Service: Cardiovascular;  Laterality: N/A;  . APPENDECTOMY    . ENDARTERECTOMY FEMORAL Left 01/27/2019   Procedure: ENDARTERECTOMY LEFT FEMORAL ARTERY AND PROFUNDA  ARTERY;  Surgeon: Serafina Mitchell, MD;  Location: [place];  Service: Vascular;  Laterality: Left;  . FEMORAL-POPLITEAL BYPASS GRAFT Left 01/27/2019   Procedure: BYPASS GRAFT FEMORAL-POPLITEAL ARTERY;  Surgeon: Serafina Mitchell, MD;  Location: [place];  Service: Vascular;  Laterality: Left;  . FINGER AMPUTATION     Left second and third fingers, work accident  . KNEE  SURGERY Left   . LOWER EXTREMITY ANGIOGRAM  05/2008   Left SFA atherectomy with diamondback 2.25 utilizing distal protection device NAV6 placed in the left popliteal artery. Angioplasty of the left SFA with ATV balloon 6 x 40   . PATCH ANGIOPLASTY Left 01/27/2019   Procedure: Patch Angioplasty using Vein;  Surgeon: Serafina Mitchell, MD;  Location: [place];  Service: Vascular;  Laterality: Left;  . SHOULDER SURGERY Right   . VEIN HARVEST Left 01/27/2019   Procedure: Left Saphenous Vein Harvest;  Surgeon: Serafina Mitchell, MD;  Location: [place];  Service: Vascular;  Laterality: Left;    History reviewed. No pertinent family history. Social History:  reports that he quit smoking about 9 years ago. His smoking use included cigarettes. He has a 40.00 pack-year smoking history. He has never used smokeless tobacco. He reports previous alcohol use. He reports that he does not use drugs.  Allergies: No Known Allergies  Medications Prior to Admission  Medication Sig Dispense Refill  . carvedilol (COREG CR) 40 MG 24 hr capsule Take 40 mg by mouth at bedtime.    . cefTRIAXone (ROCEPHIN) IVPB Inject 2 g into the vein daily. Indication:  Osteomyelitis  Last Day of Therapy:  02/28/2019 Labs - Once weekly:  CBC/D and BMP, Labs - Every other week:  ESR and CRP 39 Units 0  . clopidogrel (PLAVIX) 75 MG tablet Take 75 mg by mouth daily.    . diflunisal (DOLOBID)  500 MG TABS tablet Take 500 mg by mouth 2 (two) times daily as needed (for pain management- take with food or milk).    . DULoxetine (CYMBALTA) 60 MG capsule Take 60 mg by mouth daily.    . fenofibrate 160 MG tablet Take 160 mg by mouth daily.    . fexofenadine (ALLEGRA) 180 MG tablet Take 180 mg by mouth daily as needed for allergies or rhinitis.    . Gabapentin, Once-Daily, (GRALISE) 600 MG TABS Take 1,200 mg by mouth at bedtime.    . hydroxypropyl methylcellulose / hypromellose (ISOPTO TEARS / GONIOVISC) 2.5 % ophthalmic solution Place 1 drop into  both eyes daily as needed for dry eyes.    . insulin aspart (NOVOLOG FLEXPEN) 100 UNIT/ML FlexPen Inject 6-8 Units into the skin See admin instructions. Inject 6-8 units into the skin two times a day (morning and early afternoon) as needed, per sliding scale: BGL 300-350 = 6 units; 351-400 = 8 units; >400 = CALL STAYWELL    . Insulin Degludec-Liraglutide (XULTOPHY) 100-3.6 UNIT-MG/ML SOPN Inject 22 Units into the skin daily before breakfast.     . linezolid (ZYVOX) 600 MG tablet Take 600 mg by mouth 2 (two) times daily.    Marland Kitchen lisinopril (ZESTRIL) 40 MG tablet Take 40 mg by mouth daily.    Marland Kitchen morphine (MS CONTIN) 15 MG 12 hr tablet Take 15 mg by mouth every 12 (twelve) hours.    . pantoprazole (PROTONIX) 40 MG tablet Take 40 mg by mouth daily before breakfast.    . Probiotic Product (ALIGN) 4 MG CAPS Take 4 mg by mouth daily.    . riboflavin (VITAMIN B-2) 100 MG TABS tablet Take 100 mg by mouth daily.    . rosuvastatin (CRESTOR) 20 MG tablet Take 20 mg by mouth daily.    . sodium chloride 1 g tablet Take 2 g by mouth See admin instructions. Take 2 grams by mouth in the morning and 2 grams at bedtime    . spironolactone (ALDACTONE) 25 MG tablet Take 25 mg by mouth daily.    . tamsulosin (FLOMAX) 0.4 MG CAPS capsule Take 0.4 mg by mouth at bedtime.    . traZODone (DESYREL) 100 MG tablet Take 100 mg by mouth at bedtime.    . vancomycin IVPB Inject 1,000 mg into the vein every 12 (twelve) hours. Indication:  Osteomyelitis  Last Day of Therapy:  02/28/2019 Labs - Sunday/Monday:  CBC/D, BMP, and vancomycin trough. Labs - Thursday:  BMP and vancomycin trough Labs - Every other week:  ESR and CRP 78 Units 0  . amLODipine (NORVASC) 10 MG tablet Take 1 tablet (10 mg total) by mouth daily. 30 tablet 0  . aspirin EC 81 MG EC tablet Take 1 tablet (81 mg total) by mouth daily. 30 tablet 0  . atorvastatin (LIPITOR) 40 MG tablet Take 1 tablet (40 mg total) by mouth daily at 6 PM. 30 tablet 0  . levETIRAcetam  (KEPPRA) 500 MG tablet Take 1 tablet (500 mg total) by mouth 2 (two) times daily. 60 tablet 0  . mupirocin ointment (BACTROBAN) 2 % Apply 1 application topically See admin instructions. Apply topically as directed for wound care       Results for orders placed or performed during the hospital encounter of 02/22/19 (from the past 48 hour(s))  Glucose, capillary     Status: Abnormal   Collection Time: 02/22/19  9:58 AM  Result Value Ref Range   Glucose-Capillary 300 (H) 70 -  99 mg/dL   No results found.  Review of Systems  All other systems reviewed and are negative.   Blood pressure (!) 188/81, pulse 72, temperature 98.6 F (37 C), temperature source Oral, resp. rate 16, height 5' 9"  (1.753 m), weight 101.6 kg, SpO2 100 %. Physical Exam  Patient is alert, oriented, no adenopathy, well-dressed, normal affect, normal respiratory effort. Examination patient has a palpable pulse there is no redness no cellulitis he has a ulcer beneath the fifth metatarsal head which probes to bone.  Radiographs shows destructive bony changes with chronic osteomyelitis.  There is no cellulitis he does have venous stasis swelling but no venous ulcers.  Patient has uncontrolled type 2 diabetes with a hemoglobin A1c of 13.5. Assessment/Plan Assessment & Plan: Visit Diagnoses:  1. Non-pressure chronic ulcer of other part of left foot with necrosis of bone ([place])   2. Chronic venous hypertension (idiopathic) with ulcer and inflammation of bilateral lower extremity (HCC)   3. Osteomyelitis of fifth toe of left foot ([place])     Plan: Plan: We will plan for fifth ray amputation of the left foot due to the chronic osteomyelitis.  Patient has improved vascular status and should heal the incision.  Discussed risks and benefits of surgery discussed the importance of nonweightbearing.  Discussed that with weightbearing the wound will pull apart and we will require further surgery.  Patient states he understands wished to  proceed at this time.  Had a conversation with his managed care plan they feel they need to monitor him closely after surgery and do not feel like they can monitor him after surgery on Friday they have requested surgery on Monday we will try to set this up surgery will be an outpatient they will follow-up with the patient in the recovery room and planned their postoperative care.  Bevely Palmer Johngabriel Verde, PA 02/22/2019, 10:38 AM

## 2019-02-22 NOTE — Anesthesia Postprocedure Evaluation (Signed)
Anesthesia Post Note  Patient: Danny Carpenter  Procedure(s) Performed: LEFT FOOT 5TH RAY AMPUTATION (Left )     Patient location during evaluation: PACU Anesthesia Type: General Level of consciousness: awake and alert Pain management: pain level controlled Vital Signs Assessment: post-procedure vital signs reviewed and stable Respiratory status: spontaneous breathing, nonlabored ventilation and respiratory function stable Cardiovascular status: blood pressure returned to baseline and stable Postop Assessment: no apparent nausea or vomiting Anesthetic complications: no    Last Vitals:  Vitals:   02/22/19 1223 02/22/19 1239  BP: (!) 149/85 (!) 157/72  Pulse: 67 61  Resp: 16 15  Temp: 36.6 C 36.5 C  SpO2: 98% 96%    Last Pain:  Vitals:   02/22/19 1239  TempSrc:   PainSc: 3                  Armeda Plumb,W. EDMOND

## 2019-02-23 ENCOUNTER — Encounter (HOSPITAL_COMMUNITY): Payer: Self-pay | Admitting: Orthopedic Surgery

## 2019-02-24 ENCOUNTER — Inpatient Hospital Stay: Payer: No Typology Code available for payment source | Admitting: Family

## 2019-03-02 ENCOUNTER — Encounter: Payer: Self-pay | Admitting: Physician Assistant

## 2019-03-02 ENCOUNTER — Other Ambulatory Visit: Payer: Self-pay

## 2019-03-02 ENCOUNTER — Ambulatory Visit (INDEPENDENT_AMBULATORY_CARE_PROVIDER_SITE_OTHER): Payer: No Typology Code available for payment source | Admitting: Physician Assistant

## 2019-03-02 VITALS — Ht 69.0 in | Wt 224.0 lb

## 2019-03-02 DIAGNOSIS — M86272 Subacute osteomyelitis, left ankle and foot: Secondary | ICD-10-CM

## 2019-03-02 NOTE — Progress Notes (Signed)
Office Visit Note   Patient: Danny Carpenter           Date of Birth: 03-16-1951           MRN: [id number] Visit Date: 03/02/2019              Requested by: Melony Overly, MD [address] [place],  [place] [address] PCP: Melony Overly, MD  Chief Complaint  Patient presents with  . Left Foot - Routine Post Op    02/22/2019 left foot 5th ray      HPI: Patient presents today 1 week status post left foot fifth ray amputation he is doing well and is just placing a small amount of weight on his heel for transferring into his electric wheelchair he denies any pain  Assessment & Plan: Visit Diagnoses: No diagnosis found.  Plan: He may begin to wash his foot with Dial soap and water he is not to soak his foot we discussed continuing what he is doing with minimal weightbearing on his heel only when necessary.  I did contact his son by request and left him a message regarding taking today's visit he will follow-up in 1 week  Follow-Up Instructions: No follow-ups on file.   Ortho Exam  Patient is alert, oriented, no adenopathy, well-dressed, normal affect, normal respiratory effort. Left foot: Swelling is well controlled his foot is warm incision is healing nicely without any evidence of cellulitis dehiscence and there are well apposed healthy wound edges  Imaging: No results found. No images are attached to the encounter.  Labs: Lab Results  Component Value Date   HGBA1C 13.5 (H) 01/18/2019   HGBA1C 10.8 (H) 01/17/2019   REPTSTATUS 01/18/2019 FINAL 01/17/2019   CULT  01/17/2019    NO GROWTH Performed at [facility], [place] [address] [place],  [address]      Lab Results  Component Value Date   ALBUMIN 3.8 01/27/2019    No results found for: MG No results found for: VD25OH  No results found for: PREALBUMIN CBC EXTENDED Latest Ref Rng & Units 02/22/2019 01/28/2019 01/27/2019  WBC 4.0 - 10.5 K/uL 5.4 10.8(H) 14.6(H)  RBC 4.22 - 5.81 MIL/uL 3.77(L)  3.47(L) 3.72(L)  HGB 13.0 - 17.0 g/dL 10.9(L) 10.1(L) 11.1(L)  HCT 39.0 - 52.0 % 34.9(L) 32.0(L) 33.5(L)  PLT 150 - 400 K/uL 186 257 271  NEUTROABS 1.7 - 7.7 K/uL - - -  LYMPHSABS 0.7 - 4.0 K/uL - - -     Body mass index is 33.08 kg/m.  Orders:  No orders of the defined types were placed in this encounter.  No orders of the defined types were placed in this encounter.    Procedures: No procedures performed  Clinical Data: No additional findings.  ROS:  All other systems negative, except as noted in the HPI. Review of Systems  Objective: Vital Signs: Ht 5\' 9"  (1.753 m)   Wt 224 lb (101.6 kg)   BMI 33.08 kg/m   Specialty Comments:  No specialty comments available.  PMFS History: Patient Active Problem List   Diagnosis Date Noted  . PICC (peripherally inserted central catheter) in place 02/16/2019  . Encounter for medication management 02/16/2019  . PAD (peripheral artery disease) ([place]) 01/27/2019  . Type 2 diabetes mellitus with other specified complication ([place])   . Subacute osteomyelitis of left foot ([place])   . PVD (peripheral vascular disease) ([place])   . CVA (cerebral vascular accident) ([place]) 01/17/2019  . Diabetic  foot infection ([place]) 01/17/2019   Past Medical History:  Diagnosis Date  . AAA (abdominal aortic aneurysm) ([place])   . Anxiety   . Arthritis   . CHF (congestive heart failure) ([place])   . COPD (chronic obstructive pulmonary disease) ([place])   . CVA (cerebral vascular accident) ([place])   . Depression   . DM2 (diabetes mellitus, type 2) ([place])   . GERD (gastroesophageal reflux disease)   . HTN (hypertension)   . Hypercholesteremia   . Myocardial infarction ([place])    "light" heart attack 15 years ago (per pt 01/26/19)  . Peripheral vascular disease ([place])   . PONV (postoperative nausea and vomiting)   . Seizure [facility])    one time in Oct. 2020  . Sleep apnea    no cpap    No family history on file.  Past Surgical History:  Procedure Laterality Date   . ABDOMINAL AORTOGRAM W/LOWER EXTREMITY N/A 01/19/2019   Procedure: ABDOMINAL AORTOGRAM W/LOWER EXTREMITY;  Surgeon: Serafina Mitchell, MD;  Location: [place] [facility];  Service: Cardiovascular;  Laterality: N/A;  . AMPUTATION Left 02/22/2019   Procedure: LEFT FOOT 5TH RAY AMPUTATION;  Surgeon: Newt Minion, MD;  Location: [place];  Service: Orthopedics;  Laterality: Left;  . APPENDECTOMY    . ENDARTERECTOMY FEMORAL Left 01/27/2019   Procedure: ENDARTERECTOMY LEFT FEMORAL ARTERY AND PROFUNDA  ARTERY;  Surgeon: Serafina Mitchell, MD;  Location: [place];  Service: Vascular;  Laterality: Left;  . FEMORAL-POPLITEAL BYPASS GRAFT Left 01/27/2019   Procedure: BYPASS GRAFT FEMORAL-POPLITEAL ARTERY;  Surgeon: Serafina Mitchell, MD;  Location: [place];  Service: Vascular;  Laterality: Left;  . FINGER AMPUTATION     Left second and third fingers, work accident  . KNEE SURGERY Left   . LOWER EXTREMITY ANGIOGRAM  05/2008   Left SFA atherectomy with diamondback 2.25 utilizing distal protection device NAV6 placed in the left popliteal artery. Angioplasty of the left SFA with ATV balloon 6 x 40   . PATCH ANGIOPLASTY Left 01/27/2019   Procedure: Patch Angioplasty using Vein;  Surgeon: Serafina Mitchell, MD;  Location: [place];  Service: Vascular;  Laterality: Left;  . SHOULDER SURGERY Right   . VEIN HARVEST Left 01/27/2019   Procedure: Left Saphenous Vein Harvest;  Surgeon: Serafina Mitchell, MD;  Location: [facility];  Service: Vascular;  Laterality: Left;   Social History   Occupational History  . Occupation: Disabled Administrator  Tobacco Use  . Smoking status: Former Smoker    Packs/day: 1.00    Years: 40.00    Pack years: 40.00    Types: Cigarettes    Quit date: 04/01/2009    Years since quitting: 9.9  . Smokeless tobacco: Never Used  Substance and Sexual Activity  . Alcohol use: Not Currently  . Drug use: Never  . Sexual activity: Not on file

## 2019-03-03 ENCOUNTER — Telehealth: Payer: Self-pay | Admitting: Orthopedic Surgery

## 2019-03-03 NOTE — Telephone Encounter (Signed)
Received call from [place] with [facility] needing clarification on weight bearing status. The number to contact Lauren is [phone]

## 2019-03-03 NOTE — Telephone Encounter (Signed)
I called and lm on vm to advise that the pt is minimal weight bearing. Through the heel only for transfer purposes. To call with any questions.

## 2019-03-05 ENCOUNTER — Telehealth: Payer: Self-pay

## 2019-03-05 NOTE — Telephone Encounter (Signed)
Faxed picture of pt's foot with concerns of stitch may have become lose and wanted to make office aware of appearance. Pt was evaluated in the office this week no changes since that visit per nursing. Advised to have the pt to continue with minimal weight bearing through the heel only for transfer purposes and that he should have the area washed with dial soap and water and to continue with the dressing changes  as ordered. He is to follow up in the office on 03/09/19 and can call sooner if they have any questions. This was sent via email to the facility NP [place].

## 2019-03-09 ENCOUNTER — Ambulatory Visit (INDEPENDENT_AMBULATORY_CARE_PROVIDER_SITE_OTHER): Payer: No Typology Code available for payment source | Admitting: Physician Assistant

## 2019-03-09 ENCOUNTER — Encounter: Payer: Self-pay | Admitting: Physician Assistant

## 2019-03-09 ENCOUNTER — Other Ambulatory Visit: Payer: Self-pay

## 2019-03-09 VITALS — Ht 69.0 in | Wt 224.0 lb

## 2019-03-09 DIAGNOSIS — M86272 Subacute osteomyelitis, left ankle and foot: Secondary | ICD-10-CM

## 2019-03-09 NOTE — Progress Notes (Signed)
Office Visit Note   Patient: Danny Carpenter           Date of Birth: 04-11-1950           MRN: [id number] Visit Date: 03/09/2019              Requested by: Melony Overly, MD [address] [place],  [place] [address] PCP: Melony Overly, MD  Chief Complaint  Patient presents with  . Left Foot - Routine Post Op    02/22/2019 left foot 5th ray      HPI this is a pleasant 17 days status post left foot fifth ray amputation he has been limiting his weightbearing to the best of his ability.  He has not had any IV antibiotics in 4 days and is requesting for his PICC line to be removed  Assessment & Plan: Visit Diagnoses: No diagnosis found.  Plan: We discussed that the next step would be to get some compression socks and he was measured for these today he understands to wear them except when he is showering.  He will continue to limit his weightbearing just for a couple more days but then may put weight on his foot.  Follow-Up Instructions: No follow-ups on file.   Ortho Exam  Patient is alert, oriented, no adenopathy, well-dressed, normal affect, normal respiratory effort. Left foot: Swelling is very well controlled pulses are palpable.  His incision is healed without any surrounding erythema and well apposed wound edges surgical sutures were removed.  PICC line was also removed pressure was held over the area and a Band-Aid applied  Imaging: No results found. No images are attached to the encounter.  Labs: Lab Results  Component Value Date   HGBA1C 13.5 (H) 01/18/2019   HGBA1C 10.8 (H) 01/17/2019   REPTSTATUS 01/18/2019 FINAL 01/17/2019   CULT  01/17/2019    NO GROWTH Performed at [facility], [place] [address] [place], [place] [address]      Lab Results  Component Value Date   ALBUMIN 3.8 01/27/2019    No results found for: MG No results found for: VD25OH  No results found for: PREALBUMIN CBC EXTENDED Latest Ref Rng & Units 02/22/2019 01/28/2019  01/27/2019  WBC 4.0 - 10.5 K/uL 5.4 10.8(H) 14.6(H)  RBC 4.22 - 5.81 MIL/uL 3.77(L) 3.47(L) 3.72(L)  HGB 13.0 - 17.0 g/dL 10.9(L) 10.1(L) 11.1(L)  HCT 39.0 - 52.0 % 34.9(L) 32.0(L) 33.5(L)  PLT 150 - 400 K/uL 186 257 271  NEUTROABS 1.7 - 7.7 K/uL - - -  LYMPHSABS 0.7 - 4.0 K/uL - - -     Body mass index is 33.08 kg/m.  Orders:  No orders of the defined types were placed in this encounter.  No orders of the defined types were placed in this encounter.    Procedures: No procedures performed  Clinical Data: No additional findings.  ROS:  All other systems negative, except as noted in the HPI. Review of Systems  Objective: Vital Signs: Ht 5\' 9"  (1.753 m)   Wt 224 lb (101.6 kg)   BMI 33.08 kg/m   Specialty Comments:  No specialty comments available.  PMFS History: Patient Active Problem List   Diagnosis Date Noted  . PICC (peripherally inserted central catheter) in place 02/16/2019  . Encounter for medication management 02/16/2019  . PAD (peripheral artery disease) ([place]) 01/27/2019  . Type 2 diabetes mellitus with other specified complication ([place])   . Subacute osteomyelitis of left foot ([place])   .  PVD (peripheral vascular disease) ([place])   . CVA (cerebral vascular accident) ([place]) 01/17/2019  . Diabetic foot infection ([place]) 01/17/2019   Past Medical History:  Diagnosis Date  . AAA (abdominal aortic aneurysm) ([place])   . Anxiety   . Arthritis   . CHF (congestive heart failure) ([place])   . COPD (chronic obstructive pulmonary disease) ([place])   . CVA (cerebral vascular accident) ([place])   . Depression   . DM2 (diabetes mellitus, type 2) ([place])   . GERD (gastroesophageal reflux disease)   . HTN (hypertension)   . Hypercholesteremia   . Myocardial infarction ([place])    "light" heart attack 15 years ago (per pt 01/26/19)  . Peripheral vascular disease ([place])   . PONV (postoperative nausea and vomiting)   . Seizure [facility])    one time in Oct. 2020  . Sleep apnea    no cpap     No family history on file.  Past Surgical History:  Procedure Laterality Date  . ABDOMINAL AORTOGRAM W/LOWER EXTREMITY N/A 01/19/2019   Procedure: ABDOMINAL AORTOGRAM W/LOWER EXTREMITY;  Surgeon: Serafina Mitchell, MD;  Location: [place] [facility];  Service: Cardiovascular;  Laterality: N/A;  . AMPUTATION Left 02/22/2019   Procedure: LEFT FOOT 5TH RAY AMPUTATION;  Surgeon: Newt Minion, MD;  Location: [place];  Service: Orthopedics;  Laterality: Left;  . APPENDECTOMY    . ENDARTERECTOMY FEMORAL Left 01/27/2019   Procedure: ENDARTERECTOMY LEFT FEMORAL ARTERY AND PROFUNDA  ARTERY;  Surgeon: Serafina Mitchell, MD;  Location: [place];  Service: Vascular;  Laterality: Left;  . FEMORAL-POPLITEAL BYPASS GRAFT Left 01/27/2019   Procedure: BYPASS GRAFT FEMORAL-POPLITEAL ARTERY;  Surgeon: Serafina Mitchell, MD;  Location: [place];  Service: Vascular;  Laterality: Left;  . FINGER AMPUTATION     Left second and third fingers, work accident  . KNEE SURGERY Left   . LOWER EXTREMITY ANGIOGRAM  05/2008   Left SFA atherectomy with diamondback 2.25 utilizing distal protection device NAV6 placed in the left popliteal artery. Angioplasty of the left SFA with ATV balloon 6 x 40   . PATCH ANGIOPLASTY Left 01/27/2019   Procedure: Patch Angioplasty using Vein;  Surgeon: Serafina Mitchell, MD;  Location: [place];  Service: Vascular;  Laterality: Left;  . SHOULDER SURGERY Right   . VEIN HARVEST Left 01/27/2019   Procedure: Left Saphenous Vein Harvest;  Surgeon: Serafina Mitchell, MD;  Location: [facility] OR;  Service: Vascular;  Laterality: Left;   Social History   Occupational History  . Occupation: Disabled Administrator  Tobacco Use  . Smoking status: Former Smoker    Packs/day: 1.00    Years: 40.00    Pack years: 40.00    Types: Cigarettes    Quit date: 04/01/2009    Years since quitting: 9.9  . Smokeless tobacco: Never Used  Substance and Sexual Activity  . Alcohol use: Not Currently  . Drug use: Never  . Sexual  activity: Not on file

## 2019-03-16 ENCOUNTER — Encounter: Payer: Self-pay | Admitting: Physician Assistant

## 2019-03-16 ENCOUNTER — Other Ambulatory Visit: Payer: Self-pay

## 2019-03-16 ENCOUNTER — Ambulatory Visit (INDEPENDENT_AMBULATORY_CARE_PROVIDER_SITE_OTHER): Payer: No Typology Code available for payment source | Admitting: Physician Assistant

## 2019-03-16 VITALS — Ht 69.0 in | Wt 224.0 lb

## 2019-03-16 DIAGNOSIS — M86272 Subacute osteomyelitis, left ankle and foot: Secondary | ICD-10-CM

## 2019-03-16 NOTE — Progress Notes (Signed)
Post-Op Visit Note   Patient: Danny Carpenter           Date of Birth: 1950-06-19           MRN: [id number] Visit Date: 03/16/2019 PCP: Melony Overly, MD  Chief Complaint:  Chief Complaint  Patient presents with  . Left Foot - Routine Post Op    02/22/2019 left foot 5th ray    HPI:  HPI The patient is a 68 year old gentleman seen today status post left foot fifth ray amputation November 23 he has been full weightbearing in slippers trying to offload pressure from the incision.  He does have a blood blister over the dorsum of his foot overall he is not concerned.  He has no pain wondering what the bony prominence on the lateral aspect of his foot is.  Curious what shoewear she should resume. Ortho Exam On examination of the left foot there is minimal swelling no erythema incision is healing well proximally there is a 1 inch length that is scabbed over and not yet healed.  There is no gaping no drainage no sign of infection  Visit Diagnoses: No diagnosis found.  Plan: Discussed shoe wear.  Provided an order to [facility] clinic for custom orthotics extra-depth shoes as well.  He may benefit from a spacer.  He will advance his weightbearing as tolerated be mindful to offload the base of the fifth metatarsal  Follow-Up Instructions: Return in about 3 weeks (around 04/06/2019).   Imaging: No results found.  Orders:  No orders of the defined types were placed in this encounter.  No orders of the defined types were placed in this encounter.    PMFS History: Patient Active Problem List   Diagnosis Date Noted  . PICC (peripherally inserted central catheter) in place 02/16/2019  . Encounter for medication management 02/16/2019  . PAD (peripheral artery disease) ([place]) 01/27/2019  . Type 2 diabetes mellitus with other specified complication ([place])   . Subacute osteomyelitis of left foot ([place])   . PVD (peripheral vascular disease) ([place])   . CVA (cerebral vascular accident) ([place])  01/17/2019  . Diabetic foot infection ([place]) 01/17/2019   Past Medical History:  Diagnosis Date  . AAA (abdominal aortic aneurysm) ([place])   . Anxiety   . Arthritis   . CHF (congestive heart failure) ([place])   . COPD (chronic obstructive pulmonary disease) ([place])   . CVA (cerebral vascular accident) ([place])   . Depression   . DM2 (diabetes mellitus, type 2) ([place])   . GERD (gastroesophageal reflux disease)   . HTN (hypertension)   . Hypercholesteremia   . Myocardial infarction ([place])    "light" heart attack 15 years ago (per pt 01/26/19)  . Peripheral vascular disease ([place])   . PONV (postoperative nausea and vomiting)   . Seizure [facility])    one time in Oct. 2020  . Sleep apnea    no cpap    History reviewed. No pertinent family history.  Past Surgical History:  Procedure Laterality Date  . ABDOMINAL AORTOGRAM W/LOWER EXTREMITY N/A 01/19/2019   Procedure: ABDOMINAL AORTOGRAM W/LOWER EXTREMITY;  Surgeon: Serafina Mitchell, MD;  Location: [place] [facility];  Service: Cardiovascular;  Laterality: N/A;  . AMPUTATION Left 02/22/2019   Procedure: LEFT FOOT 5TH RAY AMPUTATION;  Surgeon: Newt Minion, MD;  Location: [place];  Service: Orthopedics;  Laterality: Left;  . APPENDECTOMY    . ENDARTERECTOMY FEMORAL Left 01/27/2019   Procedure: ENDARTERECTOMY LEFT FEMORAL ARTERY AND PROFUNDA  ARTERY;  Surgeon: Serafina Mitchell, MD;  Location: [place];  Service: Vascular;  Laterality: Left;  . FEMORAL-POPLITEAL BYPASS GRAFT Left 01/27/2019   Procedure: BYPASS GRAFT FEMORAL-POPLITEAL ARTERY;  Surgeon: Serafina Mitchell, MD;  Location: [place];  Service: Vascular;  Laterality: Left;  . FINGER AMPUTATION     Left second and third fingers, work accident  . KNEE SURGERY Left   . LOWER EXTREMITY ANGIOGRAM  05/2008   Left SFA atherectomy with diamondback 2.25 utilizing distal protection device [id number] placed in the left popliteal artery. Angioplasty of the left SFA with ATV balloon 6 x 40   . PATCH ANGIOPLASTY Left  01/27/2019   Procedure: Patch Angioplasty using Vein;  Surgeon: Serafina Mitchell, MD;  Location: [place];  Service: Vascular;  Laterality: Left;  . SHOULDER SURGERY Right   . VEIN HARVEST Left 01/27/2019   Procedure: Left Saphenous Vein Harvest;  Surgeon: Serafina Mitchell, MD;  Location: [facility] OR;  Service: Vascular;  Laterality: Left;   Social History   Occupational History  . Occupation: Disabled Administrator  Tobacco Use  . Smoking status: Former Smoker    Packs/day: 1.00    Years: 40.00    Pack years: 40.00    Types: Cigarettes    Quit date: 04/01/2009    Years since quitting: 9.9  . Smokeless tobacco: Never Used  Substance and Sexual Activity  . Alcohol use: Not Currently  . Drug use: Never  . Sexual activity: Not on file

## 2019-03-22 DIAGNOSIS — L03116 Cellulitis of left lower limb: Secondary | ICD-10-CM | POA: Diagnosis not present

## 2019-03-22 DIAGNOSIS — E1165 Type 2 diabetes mellitus with hyperglycemia: Secondary | ICD-10-CM | POA: Diagnosis not present

## 2019-03-22 DIAGNOSIS — R55 Syncope and collapse: Secondary | ICD-10-CM | POA: Diagnosis not present

## 2019-03-23 DIAGNOSIS — E1165 Type 2 diabetes mellitus with hyperglycemia: Secondary | ICD-10-CM | POA: Diagnosis not present

## 2019-03-23 DIAGNOSIS — L03116 Cellulitis of left lower limb: Secondary | ICD-10-CM | POA: Diagnosis not present

## 2019-03-23 DIAGNOSIS — R55 Syncope and collapse: Secondary | ICD-10-CM | POA: Diagnosis not present

## 2019-04-19 ENCOUNTER — Other Ambulatory Visit (HOSPITAL_COMMUNITY): Payer: No Typology Code available for payment source

## 2019-04-19 ENCOUNTER — Encounter (HOSPITAL_COMMUNITY): Payer: No Typology Code available for payment source

## 2019-04-19 ENCOUNTER — Ambulatory Visit: Payer: No Typology Code available for payment source

## 2019-04-21 ENCOUNTER — Ambulatory Visit: Payer: No Typology Code available for payment source

## 2019-05-13 ENCOUNTER — Ambulatory Visit (HOSPITAL_COMMUNITY)
Admission: RE | Admit: 2019-05-13 | Discharge: 2019-05-13 | Disposition: A | Discharge status: Home / Self Care | Payer: No Typology Code available for payment source | Source: Ambulatory Visit | Attending: Family | Admitting: Family

## 2019-05-13 ENCOUNTER — Other Ambulatory Visit: Payer: Self-pay

## 2019-05-13 ENCOUNTER — Ambulatory Visit (INDEPENDENT_AMBULATORY_CARE_PROVIDER_SITE_OTHER): Payer: No Typology Code available for payment source | Admitting: Physician Assistant

## 2019-05-13 ENCOUNTER — Ambulatory Visit (INDEPENDENT_AMBULATORY_CARE_PROVIDER_SITE_OTHER)
Admission: RE | Admit: 2019-05-13 | Discharge: 2019-05-13 | Disposition: A | Discharge status: Home / Self Care | Payer: No Typology Code available for payment source | Source: Ambulatory Visit | Attending: Family | Admitting: Family

## 2019-05-13 VITALS — BP 174/86 | HR 87 | Temp 98.2°F | Ht 71.0 in | Wt 211.6 lb

## 2019-05-13 DIAGNOSIS — I779 Disorder of arteries and arterioles, unspecified: Secondary | ICD-10-CM

## 2019-05-13 NOTE — Progress Notes (Signed)
Office Note     CC:  follow up Requesting Provider:  Melony Overly, MD  HPI: Danny Carpenter is a 69 y.o. (1951/03/04) male who presents for follow up s/p left common femoral and profundofemoral endarterectomy with vein patch angioplasty, and left femoral to above knee popliteal artery bypass graft with reverse ipsilateral translocated saphenous vein on 01/27/19 by Dr. Trula Slade due to left foot ulceration. He has been following up with [facility] for his left 5th ray amputation, which is healing well. He completed his course of IV antibiotics. Today he denies any claudication symptoms, new ulcerations or non healing wounds, or rest pain in either bilateral lower extremities. He does have some concerns about the bony prominences in his left foot secondary to his 5th toe amputation. Otherwise no new medical problems since he was last seen.   Additionally he was noted to have a 4.6 cm AAA at last visit in 02/17/19. He remains asymptomatic. No low back pain, abdominal pain, or lower extremity symptoms.  The pt on a statin for cholesterol management.  The pt on a daily aspirin. Other AC: Plavix The pt on Lisinopril for hypertension.   The pt is diabetic on insulin Tobacco hx:  Former smoker  Past Medical History:  Diagnosis Date  . AAA (abdominal aortic aneurysm) ([place])   . Anxiety   . Arthritis   . CHF (congestive heart failure) ([place])   . COPD (chronic obstructive pulmonary disease) ([place])   . CVA (cerebral vascular accident) ([place])   . Depression   . DM2 (diabetes mellitus, type 2) ([place])   . Fracture of rib    Left  . GERD (gastroesophageal reflux disease)   . HTN (hypertension)   . Hypercholesteremia   . Myocardial infarction ([place])    "light" heart attack 15 years ago (per pt 01/26/19)  . Peripheral vascular disease ([place])   . PONV (postoperative nausea and vomiting)   . Seizure [facility])    one time in Oct. 2020  . Sleep apnea    no cpap    Past Surgical History:  Procedure  Laterality Date  . ABDOMINAL AORTOGRAM W/LOWER EXTREMITY N/A 01/19/2019   Procedure: ABDOMINAL AORTOGRAM W/LOWER EXTREMITY;  Surgeon: Serafina Mitchell, MD;  Location: [place] [facility];  Service: Cardiovascular;  Laterality: N/A;  . AMPUTATION Left 02/22/2019   Procedure: LEFT FOOT 5TH RAY AMPUTATION;  Surgeon: Newt Minion, MD;  Location: [place];  Service: Orthopedics;  Laterality: Left;  . APPENDECTOMY    . ENDARTERECTOMY FEMORAL Left 01/27/2019   Procedure: ENDARTERECTOMY LEFT FEMORAL ARTERY AND PROFUNDA  ARTERY;  Surgeon: Serafina Mitchell, MD;  Location: [place];  Service: Vascular;  Laterality: Left;  . FEMORAL-POPLITEAL BYPASS GRAFT Left 01/27/2019   Procedure: BYPASS GRAFT FEMORAL-POPLITEAL ARTERY;  Surgeon: Serafina Mitchell, MD;  Location: [place];  Service: Vascular;  Laterality: Left;  . FINGER AMPUTATION     Left second and third fingers, work accident  . KNEE SURGERY Left   . LOWER EXTREMITY ANGIOGRAM  05/2008   Left SFA atherectomy with diamondback 2.25 utilizing distal protection device NAV6 placed in the left popliteal artery. Angioplasty of the left SFA with ATV balloon 6 x 40   . PATCH ANGIOPLASTY Left 01/27/2019   Procedure: Patch Angioplasty using Vein;  Surgeon: Serafina Mitchell, MD;  Location: [place];  Service: Vascular;  Laterality: Left;  . SHOULDER SURGERY Right   . VEIN HARVEST Left 01/27/2019   Procedure: Left Saphenous Vein Harvest;  Surgeon: Trula Slade,  Butch Penny, MD;  Location: [facility] OR;  Service: Vascular;  Laterality: Left;    Social History   Socioeconomic History  . Marital status: Married    Spouse name: Not on file  . Number of children: Not on file  . Years of education: Not on file  . Highest education level: Not on file  Occupational History  . Occupation: Disabled Administrator  Tobacco Use  . Smoking status: Former Smoker    Packs/day: 1.00    Years: 40.00    Pack years: 40.00    Types: Cigarettes    Quit date: 04/01/2009    Years since quitting: 10.1    . Smokeless tobacco: Never Used  Substance and Sexual Activity  . Alcohol use: Not Currently  . Drug use: Never  . Sexual activity: Not on file  Other Topics Concern  . Not on file  Social History Narrative   Lives with wife in [place]   Social Determinants of Health   Financial Resource Strain:   . Difficulty of Paying Living Expenses: Not on file  Food Insecurity:   . Worried About Charity fundraiser in the Last Year: Not on file  . Ran Out of Food in the Last Year: Not on file  Transportation Needs:   . Lack of Transportation (Medical): Not on file  . Lack of Transportation (Non-Medical): Not on file  Physical Activity:   . Days of Exercise per Week: Not on file  . Minutes of Exercise per Session: Not on file  Stress:   . Feeling of Stress : Not on file  Social Connections:   . Frequency of Communication with Friends and Family: Not on file  . Frequency of Social Gatherings with Friends and Family: Not on file  . Attends Religious Services: Not on file  . Active Member of Clubs or Organizations: Not on file  . Attends Archivist Meetings: Not on file  . Marital Status: Not on file  Intimate Partner Violence:   . Fear of Current or Ex-Partner: Not on file  . Emotionally Abused: Not on file  . Physically Abused: Not on file  . Sexually Abused: Not on file   History reviewed. No pertinent family history.  Current Outpatient Medications  Medication Sig Dispense Refill  . amLODipine (NORVASC) 10 MG tablet Take 1 tablet (10 mg total) by mouth daily. 30 tablet 0  . carvedilol (COREG CR) 40 MG 24 hr capsule Take 40 mg by mouth at bedtime.    . clopidogrel (PLAVIX) 75 MG tablet Take 75 mg by mouth daily.    . diflunisal (DOLOBID) 500 MG TABS tablet Take 500 mg by mouth 2 (two) times daily as needed (for pain management- take with food or milk).    . DULoxetine (CYMBALTA) 60 MG capsule Take 60 mg by mouth daily.    . fenofibrate 160 MG tablet Take 160 mg by  mouth daily.    . fexofenadine (ALLEGRA) 180 MG tablet Take 180 mg by mouth daily as needed for allergies or rhinitis.    . Gabapentin, Once-Daily, (GRALISE) 600 MG TABS Take 1,200 mg by mouth at bedtime.    . hydroxypropyl methylcellulose / hypromellose (ISOPTO TEARS / GONIOVISC) 2.5 % ophthalmic solution Place 1 drop into both eyes daily as needed for dry eyes.    . insulin aspart (NOVOLOG FLEXPEN) 100 UNIT/ML FlexPen Inject 6-8 Units into the skin See admin instructions. Inject 6-8 units into the skin two times a day (morning and  early afternoon) as needed, per sliding scale: BGL 300-350 = 6 units; 351-400 = 8 units; >400 = CALL [facility]    . Insulin Degludec-Liraglutide (XULTOPHY) 100-3.6 UNIT-MG/ML SOPN Inject 22 Units into the skin daily before breakfast.     . levETIRAcetam (KEPPRA) 500 MG tablet Take 1 tablet (500 mg total) by mouth 2 (two) times daily. 60 tablet 0  . linezolid (ZYVOX) 600 MG tablet Take 600 mg by mouth 2 (two) times daily.    Marland Kitchen lisinopril (ZESTRIL) 40 MG tablet Take 40 mg by mouth daily.    Marland Kitchen morphine (MS CONTIN) 15 MG 12 hr tablet Take 15 mg by mouth every 12 (twelve) hours.    . pantoprazole (PROTONIX) 40 MG tablet Take 40 mg by mouth daily before breakfast.    . Probiotic Product (ALIGN) 4 MG CAPS Take 4 mg by mouth daily.    . riboflavin (VITAMIN B-2) 100 MG TABS tablet Take 100 mg by mouth daily.    . rosuvastatin (CRESTOR) 20 MG tablet Take 20 mg by mouth daily.    . sodium chloride 1 g tablet Take 2 g by mouth See admin instructions. Take 2 grams by mouth in the morning and 2 grams at bedtime    . spironolactone (ALDACTONE) 25 MG tablet Take 25 mg by mouth daily.    . tamsulosin (FLOMAX) 0.4 MG CAPS capsule Take 0.4 mg by mouth at bedtime.    . traZODone (DESYREL) 100 MG tablet Take 100 mg by mouth at bedtime.     No current facility-administered medications for this visit.    No Known Allergies   REVIEW OF SYSTEMS:  Left sided back and chest pain secondary  to fall- recently broke several ribs  [X]  denotes positive finding, [ ]  denotes negative finding Cardiac  Comments:  Chest pain or chest pressure:    Shortness of breath upon exertion:    Short of breath when lying flat:    Irregular heart rhythm:        Vascular    Pain in calf, thigh, or hip brought on by ambulation:    Pain in feet at night that wakes you up from your sleep:     Blood clot in your veins:    Leg swelling:         Pulmonary    Oxygen at home:    Productive cough:     Wheezing:         Neurologic    Sudden weakness in arms or legs:     Sudden numbness in arms or legs:     Sudden onset of difficulty speaking or slurred speech:    Temporary loss of vision in one eye:     Problems with dizziness:         Gastrointestinal    Blood in stool:     Vomited blood:         Genitourinary    Burning when urinating:     Blood in urine:        Psychiatric    Major depression:         Hematologic    Bleeding problems:    Problems with blood clotting too easily:        Skin    Rashes or ulcers:        Constitutional    Fever or chills:      PHYSICAL EXAMINATION:  Vitals:   05/13/19 1506  BP: (!) 174/86  Pulse: 87  Temp: 98.2  F (36.8 C)  TempSrc: Temporal  SpO2: 98%  Weight: 211 lb 9.6 oz (96 kg)  Height: 5\' 11"  (1.803 m)    General:  WDWN in NAD Gait: normal HENT: WNL, normocephalic Pulmonary: normal non-labored breathing , without Rales, rhonchi, wheezing Cardiac: regular HR, without  Murmurs without carotid bruit Abdomen: soft, NT, no masses. Normal bowel sounds Skin: without rashes Vascular Exam/Pulses:  Right Left  Radial 2+ (normal) 2+ (normal)  Ulnar 2+ (normal) 2+ (normal)  Femoral 2+ (hyperdynamic) 2+ (normal)  Popliteal 1+ (weak) absent  DP Doppler Doppler  PT Doppler Doppler   Extremities: without ischemic changes, without Gangrene , without cellulitis; without open wounds; Left 5th ray amputation site healed. Collapsed arch  and bunion present on left foot Musculoskeletal: no muscle wasting or atrophy  Neurologic: A&O X 3;  No focal weakness or paresthesias are detected Psychiatric:  The pt has Normal affect.   Non-Invasive Vascular Imaging:   Left Graft #1:  +-------------------+--------+------------+----------+---------------------  ----+            PSV cm/sStenosis  Waveform Comments           +-------------------+--------+------------+----------+---------------------  ----+  Inflow       202         monophasic               +-------------------+--------+------------+----------+---------------------  ----+  Proximal      417   >70%    monophasic               Anastomosis        stenosis                      +-------------------+--------+------------+----------+---------------------  ----+  Proximal Graft   135         monophasic               +-------------------+--------+------------+----------+---------------------  ----+  Mid Graft     311   >70%    monophasic                             stenosis                      +-------------------+--------+------------+----------+---------------------  ----+  Distal Graft    93         monophasic               +-------------------+--------+------------+----------+---------------------  ----+  Distal Anastomosis 132         monophasicnot adequately                                 visualized.          +-------------------+--------+------------+----------+---------------------  ----+  Outflow      82         monophasic               +-------------------+--------+------------+----------+---------------------    ----+    Summary:  Left: Patent graft with >70% stenosis at the proximal anastomosis and in  the mid graft.   He was noted to have a 4.6 cm AAA at last visit in 02/17/19.     ASSESSMENT/PLAN:: 69 y.o. male here for follow up for s/p left common femoral and profundofemoral endarterectomy with vein patch angioplasty, and left femoral to above knee popliteal artery bypass graft with reverse ipsilateral translocated saphenous  vein by Dr. Etta Quill. Left foot remains healed. No new symptoms LLE. LLE graft duplex today concerning for potential compromise of graft with elevated velocities both at the proximal anastomosis and in the mid graft. ABI however is stable. I discussed with patient concern for the graft being at risk and potential for it to be come occluded. Recommend Aortogram with LLE runoff with possible intervention. He is agreeable. Risk/ alternatives of the procedure were discussed with him. I also discussed symptoms with him that would warrant immediate presentation to the emergency room. He will continue his Aspirin, Plavix and Crestor.   Aortogram with LLE runoff with possible intervention scheduled with Dr. Trula Slade. Patient will follow up in the office for ABI and duplex 4-6 weeks post intervention   Karoline Caldwell, PA-C [facility] [phone]  Clinic MD:   Dr. Scot Dock

## 2019-05-17 ENCOUNTER — Inpatient Hospital Stay (HOSPITAL_COMMUNITY): Admission: RE | Admit: 2019-05-17 | Payer: No Typology Code available for payment source | Source: Ambulatory Visit

## 2019-05-18 ENCOUNTER — Ambulatory Visit (HOSPITAL_COMMUNITY): Admit: 2019-05-18 | Payer: No Typology Code available for payment source | Admitting: Surgery

## 2019-05-18 ENCOUNTER — Encounter (HOSPITAL_COMMUNITY): Payer: Self-pay

## 2019-05-18 SURGERY — ABDOMINAL AORTOGRAM W/LOWER EXTREMITY
Anesthesia: LOCAL | Laterality: Left

## 2019-06-02 ENCOUNTER — Telehealth: Payer: Self-pay

## 2019-06-02 NOTE — Telephone Encounter (Signed)
Margarita Grizzle from [facility] called to let us know pt's potassium is 5.5. I have sent Dr. Carlis Abbott a message to let him know.

## 2019-06-03 ENCOUNTER — Encounter (HOSPITAL_COMMUNITY)
Admission: RE | Disposition: A | Discharge status: Home / Self Care | Payer: Self-pay | Source: Home / Self Care | Attending: Vascular Surgery

## 2019-06-03 ENCOUNTER — Ambulatory Visit (HOSPITAL_COMMUNITY)
Admission: RE | Admit: 2019-06-03 | Discharge: 2019-06-03 | Disposition: A | Discharge status: Home / Self Care | Payer: No Typology Code available for payment source | Attending: Vascular Surgery | Admitting: Vascular Surgery

## 2019-06-03 DIAGNOSIS — Z538 Procedure and treatment not carried out for other reasons: Secondary | ICD-10-CM | POA: Diagnosis not present

## 2019-06-03 DIAGNOSIS — U071 COVID-19: Secondary | ICD-10-CM | POA: Diagnosis not present

## 2019-06-03 SURGERY — ABDOMINAL AORTOGRAM W/LOWER EXTREMITY
Anesthesia: LOCAL

## 2019-06-03 NOTE — Progress Notes (Signed)
Mr Pedreira states he is leaving "they think I have Covid, and I might, so they want do the test" Rennis Harding, Rn called and informed.

## 2019-06-03 NOTE — Progress Notes (Signed)
Spoke with Dr Carlis Abbott regarding + Covid test,  He states to call infectious disease to get their recommendations.

## 2019-06-03 NOTE — Progress Notes (Addendum)
Spoke with [facility] that set the patient up for procedure.  Got my details of the patient Covid scenario.  Here is rundown of the story  Patient's wife developed Covid in December 2020.  Patient was tested for Covid 03/22/19, he was negative,  He later developed symptoms so the [facility] treated him for Covid and did not retest him.  He got Covid tested on 06/01/19 for his pre procedure testing, that test was +.  We have rescheduled procedure for March 18th, 14 days post positive Covid result.  Will notify son.

## 2019-06-03 NOTE — Progress Notes (Signed)
Spoke with infectious disease regarding + Covid test from 06/01/19, - result in December,  Dr Baxter Flattery states that we should treat the case as a + Covid result and reschedule the patient.

## 2019-06-03 NOTE — Progress Notes (Signed)
Notified yesterday by Reino Bellis, RN that office called to say patient had a + Covid test in December.  That we would not have to retest him.  Patient arrived with covid results in his hand,  Covid results were actually + from 06/01/19.  Patient has had both covid vaccines in February.  Will discuss with Dr Carlis Abbott and infectious disease.

## 2019-06-08 ENCOUNTER — Telehealth: Payer: Self-pay | Admitting: Orthopedic Surgery

## 2019-06-08 NOTE — Telephone Encounter (Signed)
Received vm from patient wanting to get copy of records. IC,lmvm advised need to sign records release form. Once we receive auth, we can process his request.

## 2019-06-11 ENCOUNTER — Other Ambulatory Visit: Payer: Self-pay

## 2019-06-11 ENCOUNTER — Other Ambulatory Visit: Payer: Self-pay | Admitting: Sports Medicine

## 2019-06-11 ENCOUNTER — Ambulatory Visit (INDEPENDENT_AMBULATORY_CARE_PROVIDER_SITE_OTHER): Payer: Medicare Other | Admitting: Sports Medicine

## 2019-06-11 ENCOUNTER — Encounter: Payer: Self-pay | Admitting: Sports Medicine

## 2019-06-11 ENCOUNTER — Ambulatory Visit (INDEPENDENT_AMBULATORY_CARE_PROVIDER_SITE_OTHER): Payer: Medicare Other

## 2019-06-11 DIAGNOSIS — M79672 Pain in left foot: Secondary | ICD-10-CM

## 2019-06-11 DIAGNOSIS — M14672 Charcot's joint, left ankle and foot: Secondary | ICD-10-CM

## 2019-06-11 DIAGNOSIS — L03032 Cellulitis of left toe: Secondary | ICD-10-CM

## 2019-06-11 DIAGNOSIS — I739 Peripheral vascular disease, unspecified: Secondary | ICD-10-CM

## 2019-06-11 DIAGNOSIS — M21962 Unspecified acquired deformity of left lower leg: Secondary | ICD-10-CM | POA: Diagnosis not present

## 2019-06-11 DIAGNOSIS — E785 Hyperlipidemia, unspecified: Secondary | ICD-10-CM

## 2019-06-11 DIAGNOSIS — L97521 Non-pressure chronic ulcer of other part of left foot limited to breakdown of skin: Secondary | ICD-10-CM

## 2019-06-11 DIAGNOSIS — I714 Abdominal aortic aneurysm, without rupture, unspecified: Secondary | ICD-10-CM

## 2019-06-11 DIAGNOSIS — E11610 Type 2 diabetes mellitus with diabetic neuropathic arthropathy: Secondary | ICD-10-CM

## 2019-06-11 DIAGNOSIS — L02612 Cutaneous abscess of left foot: Secondary | ICD-10-CM

## 2019-06-11 DIAGNOSIS — Z89422 Acquired absence of other left toe(s): Secondary | ICD-10-CM

## 2019-06-11 HISTORY — DX: Hyperlipidemia, unspecified: E78.5

## 2019-06-11 HISTORY — DX: Abdominal aortic aneurysm, without rupture, unspecified: I71.40

## 2019-06-11 HISTORY — DX: Abdominal aortic aneurysm, without rupture: I71.4

## 2019-06-11 MED ORDER — SULFAMETHOXAZOLE-TRIMETHOPRIM 400-80 MG PO TABS: 1.0000 | ORAL_TABLET | Freq: Two times a day (BID) | ORAL | 0 refills | Status: DC

## 2019-06-11 NOTE — Progress Notes (Signed)
Subjective: Danny Carpenter is a 69 y.o. male patient seen in office for evaluation of ulceration of the left foot at the big toe joint.  Patient reports that he had an amputation surgery done on November 23 of 2020 and since his amputation over the last 6 months his foot has started to change where the bones have twisted out very significantly patient reports that the big toe has now started to crossover the other toes and he is getting rubbing at the big toe joint in his shoes with redness over the area and swelling patient denies any drainage and reports that he has been putting some red oral medication to the wound at the big toe joint. Patient has a history of diabetes and a blood glucose level that is not recorded.  Patient reports that he is very upset because the previous doctor who did his amputation caused his foot to change this way and reports that he did not have any problems with his foot looking like this in the bones crossing and turning out like this until he had an amputation. Denies nausea/fever/vomiting/chills/night sweats/shortness of breath/pain. Patient has no other pedal complaints at this time.  Patient is also frustrated because he states that he cannot find a shoe that does not rub the area and also states that he has to do a lot of walking and standing to help care for his disabled wife.  Review of Systems  All other systems reviewed and are negative.    Patient Active Problem List   Diagnosis Date Noted  . AAA (abdominal aortic aneurysm) without rupture ([place]) 06/11/2019  . Hyperlipidemia 06/11/2019  . PICC (peripherally inserted central catheter) in place 02/16/2019  . Encounter for medication management 02/16/2019  . PAD (peripheral artery disease) ([place]) 01/27/2019  . Type 2 diabetes mellitus with other specified complication ([place])   . Subacute osteomyelitis of left foot ([place])   . PVD (peripheral vascular disease) ([place])   . CVA (cerebral vascular accident) ([place])  01/17/2019  . Diabetic foot infection ([place]) 01/17/2019  . Weight loss 03/19/2015  . Chest wall pain 02/06/2015  . Chronic fatigue 01/09/2015  . Eye pain 09/23/2014  . Pain medication agreement completed 02/06/2014  . Chronic pain 04/05/2013  . Senile nuclear sclerosis 12/06/2011  . Tear film insufficiency 12/06/2011  . Depression 01/07/2005  . DDD (degenerative disc disease) 10/08/2004  . Proteinuria 10/21/2002   Current Outpatient Medications on File Prior to Visit  Medication Sig Dispense Refill  . amLODipine (NORVASC) 10 MG tablet Take 1 tablet (10 mg total) by mouth daily. 30 tablet 0  . carvedilol (COREG CR) 40 MG 24 hr capsule Take 40 mg by mouth at bedtime.    . clopidogrel (PLAVIX) 75 MG tablet Take 75 mg by mouth daily.    . diflunisal (DOLOBID) 500 MG TABS tablet Take 500 mg by mouth 2 (two) times daily as needed (for pain management- take with food or milk).    . DULoxetine (CYMBALTA) 60 MG capsule Take 60 mg by mouth daily.    . fenofibrate 160 MG tablet Take 160 mg by mouth daily.    . fexofenadine (ALLEGRA) 180 MG tablet Take 180 mg by mouth daily as needed for allergies or rhinitis.    . Gabapentin, Once-Daily, (GRALISE) 600 MG TABS Take 1,200 mg by mouth at bedtime.    . hydroxypropyl methylcellulose / hypromellose (ISOPTO TEARS / GONIOVISC) 2.5 % ophthalmic solution Place 1 drop into both eyes daily as needed for dry eyes.    Marland Kitchen  insulin aspart (NOVOLOG FLEXPEN) 100 UNIT/ML FlexPen Inject 6-8 Units into the skin See admin instructions. Inject 6-8 units into the skin two times a day (morning and early afternoon) as needed, per sliding scale: BGL 300-350 = 6 units; 351-400 = 8 units; >400 = CALL STAYWELL    . Insulin Degludec-Liraglutide (XULTOPHY) 100-3.6 UNIT-MG/ML SOPN Inject 22 Units into the skin daily before breakfast.     . levETIRAcetam (KEPPRA) 500 MG tablet Take 1 tablet (500 mg total) by mouth 2 (two) times daily. 60 tablet 0  . linezolid (ZYVOX) 600 MG tablet Take  600 mg by mouth 2 (two) times daily.    Marland Kitchen lisinopril (ZESTRIL) 40 MG tablet Take 40 mg by mouth daily.    Marland Kitchen morphine (MS CONTIN) 15 MG 12 hr tablet Take 15 mg by mouth every 12 (twelve) hours.    . pantoprazole (PROTONIX) 40 MG tablet Take 40 mg by mouth daily before breakfast.    . Probiotic Product (ALIGN) 4 MG CAPS Take 4 mg by mouth daily.    . riboflavin (VITAMIN B-2) 100 MG TABS tablet Take 100 mg by mouth daily.    . rosuvastatin (CRESTOR) 20 MG tablet Take 20 mg by mouth daily.    . sodium chloride 1 g tablet Take 2 g by mouth See admin instructions. Take 2 grams by mouth in the morning and 2 grams at bedtime    . spironolactone (ALDACTONE) 25 MG tablet Take 25 mg by mouth daily.    . tamsulosin (FLOMAX) 0.4 MG CAPS capsule Take 0.4 mg by mouth at bedtime.    . traZODone (DESYREL) 100 MG tablet Take 100 mg by mouth at bedtime.     No current facility-administered medications on file prior to visit.   No Known Allergies  No results found for this or any previous visit (from the past 2160 hour(s)).  Objective: There were no vitals filed for this visit.  General: Patient is awake, alert, oriented x 3 and in no acute distress.  Dermatology: Skin is warm and dry bilateral with a partial thickness ulceration present medial great toe on the left. Ulceration measures 1 cm x 1 cm x cm. There is a mildly macerated border with a eschlerotic base. The ulceration does not probe to bone. There is no malodor, no active drainage, blanchable erythema, focal edema.  Previous fifth amputation site healed on left, no acute signs of infection.   Vascular: Dorsalis Pedis pulse = 0/4 Bilateral,  Posterior Tibial pulse = 0/4 Bilateral,  Capillary Fill Time < 5 seconds  Neurologic: Protective sensation severely diminished on the left.  Musculosketal: There is significant left foot deformity patient has a Z foot type with a very large bunion and crossover deformity of the hallux onto the lesser toes with  protrusion of the fifth metatarsal base at previous amputation stump site on left.  Xrays, Left foot: Evidence of partial fifth ray amputation, there is significant midtarsal fragmentation of bones with fractures at all the bases of the metatarsals of the left foot with significant abducted position and significant bunion with hammertoe deformity supportive of neuropathic foot changes. No gas in soft tissues.  Compared today's x-ray with his presurgical x-rays and there is no evidence of acute Charcot that was evident so likely this Charcot event has happened after he had his amputation surgery  No results for input(s): GRAMSTAIN, LABORGA in the last 8760 hours.  Assessment and Plan:  Problem List Items Addressed This Visit  Cardiovascular and Mediastinum   PAD (peripheral artery disease) ([place])    Other Visit Diagnoses    Left foot pain    -  Primary   Charcot foot due to diabetes mellitus ([place])       History of amputation of lesser toe of left foot ([facility])       Foot deformity, left       Ulcer of left foot, limited to breakdown of skin ([facility])       Cellulitis and abscess of toe of left foot          -Examined patient and discussed the progression of the wound and treatment alternatives. -Xrays reviewed and discussed in great detail with patient that likely in comparison to his preoperative x-rays that the Charcot event happened after his surgery and could not be predicted and advised patient due to his diabetes and neuropathy these are factors that likely worsen his foot after surgery causing the bones to fragment fracture and shift -Cleansed ulceration at left first metatarsophalangeal joint/big toe joint -Applied Medihoney and dry sterile dressing and instructed patient to continue with daily dressings at home consisting of Medihoney and bandaid/dry sterile dressing. -Dispensed surgical shoe and advised patient to use the shoe to prevent worsening of Charcot deformity and fractures at  the midfoot as well as to prevent worsening and rubbing at ulcer at great toe joint -Prescribe Bactrim for preventative measures in the setting of ulceration with blanchable erythema and possible cellulitis versus inflammation -Advised patient to continue with vascular follow-up of which he will see them on Thursday and I thoroughly informed patient that if he does not have any circulation he is at risk of his new wound at the left great toe joint not healing and at risk of need for further amputation - Advised patient to go to the ER or return to office if the wound worsens or if constitutional symptoms are present. -I encourage patient to stay compliant with diabetic medication and good glycemic control -I encourage patient to stay compliant with my plan of care and advised patient that if he is struggling with helping to take care of his wife to discuss with me at next office visit to see if he or his wife could qualify for home assistance -Patient to return to office in 2 weeks for follow up care and evaluation or sooner if problems arise.  Landis Martins, DPM

## 2019-06-17 ENCOUNTER — Other Ambulatory Visit: Payer: Self-pay

## 2019-06-17 ENCOUNTER — Encounter (HOSPITAL_COMMUNITY)
Admission: RE | Disposition: A | Discharge status: Home / Self Care | Payer: Self-pay | Source: Home / Self Care | Attending: Vascular Surgery

## 2019-06-17 ENCOUNTER — Ambulatory Visit (HOSPITAL_COMMUNITY)
Admission: RE | Admit: 2019-06-17 | Discharge: 2019-06-17 | Disposition: A | Discharge status: Home / Self Care | Payer: No Typology Code available for payment source | Attending: Vascular Surgery | Admitting: Vascular Surgery

## 2019-06-17 DIAGNOSIS — G4730 Sleep apnea, unspecified: Secondary | ICD-10-CM | POA: Insufficient documentation

## 2019-06-17 DIAGNOSIS — I252 Old myocardial infarction: Secondary | ICD-10-CM | POA: Diagnosis not present

## 2019-06-17 DIAGNOSIS — I509 Heart failure, unspecified: Secondary | ICD-10-CM | POA: Diagnosis not present

## 2019-06-17 DIAGNOSIS — E7800 Pure hypercholesterolemia, unspecified: Secondary | ICD-10-CM | POA: Insufficient documentation

## 2019-06-17 DIAGNOSIS — Z79899 Other long term (current) drug therapy: Secondary | ICD-10-CM | POA: Insufficient documentation

## 2019-06-17 DIAGNOSIS — I70212 Atherosclerosis of native arteries of extremities with intermittent claudication, left leg: Secondary | ICD-10-CM | POA: Diagnosis not present

## 2019-06-17 DIAGNOSIS — Z794 Long term (current) use of insulin: Secondary | ICD-10-CM | POA: Insufficient documentation

## 2019-06-17 DIAGNOSIS — I714 Abdominal aortic aneurysm, without rupture: Secondary | ICD-10-CM | POA: Diagnosis not present

## 2019-06-17 DIAGNOSIS — I110 Hypertensive heart disease with heart failure: Secondary | ICD-10-CM | POA: Insufficient documentation

## 2019-06-17 DIAGNOSIS — Z7902 Long term (current) use of antithrombotics/antiplatelets: Secondary | ICD-10-CM | POA: Diagnosis not present

## 2019-06-17 DIAGNOSIS — J449 Chronic obstructive pulmonary disease, unspecified: Secondary | ICD-10-CM | POA: Diagnosis not present

## 2019-06-17 DIAGNOSIS — K219 Gastro-esophageal reflux disease without esophagitis: Secondary | ICD-10-CM | POA: Diagnosis not present

## 2019-06-17 DIAGNOSIS — T82858A Stenosis of vascular prosthetic devices, implants and grafts, initial encounter: Secondary | ICD-10-CM | POA: Diagnosis not present

## 2019-06-17 DIAGNOSIS — F329 Major depressive disorder, single episode, unspecified: Secondary | ICD-10-CM | POA: Insufficient documentation

## 2019-06-17 DIAGNOSIS — F419 Anxiety disorder, unspecified: Secondary | ICD-10-CM | POA: Insufficient documentation

## 2019-06-17 DIAGNOSIS — E1151 Type 2 diabetes mellitus with diabetic peripheral angiopathy without gangrene: Secondary | ICD-10-CM | POA: Diagnosis not present

## 2019-06-17 DIAGNOSIS — Z8673 Personal history of transient ischemic attack (TIA), and cerebral infarction without residual deficits: Secondary | ICD-10-CM | POA: Insufficient documentation

## 2019-06-17 DIAGNOSIS — Z87891 Personal history of nicotine dependence: Secondary | ICD-10-CM | POA: Diagnosis not present

## 2019-06-17 DIAGNOSIS — Z7982 Long term (current) use of aspirin: Secondary | ICD-10-CM | POA: Insufficient documentation

## 2019-06-17 HISTORY — PX: PERIPHERAL VASCULAR BALLOON ANGIOPLASTY: CATH118281

## 2019-06-17 HISTORY — PX: ABDOMINAL AORTOGRAM W/LOWER EXTREMITY: CATH118223

## 2019-06-17 LAB — POCT I-STAT, CHEM 8
BUN: 24 mg/dL — ABNORMAL HIGH (ref 8–23)
Calcium, Ion: 1.09 mmol/L — ABNORMAL LOW (ref 1.15–1.40)
Chloride: 95 mmol/L — ABNORMAL LOW (ref 98–111)
Creatinine, Ser: 0.9 mg/dL (ref 0.61–1.24)
Glucose, Bld: 259 mg/dL — ABNORMAL HIGH (ref 70–99)
HCT: 37 % — ABNORMAL LOW (ref 39.0–52.0)
Hemoglobin: 12.6 g/dL — ABNORMAL LOW (ref 13.0–17.0)
Potassium: 4.2 mmol/L (ref 3.5–5.1)
Sodium: 131 mmol/L — ABNORMAL LOW (ref 135–145)
TCO2: 27 mmol/L (ref 22–32)

## 2019-06-17 LAB — POCT ACTIVATED CLOTTING TIME
Activated Clotting Time: 197 seconds
Activated Clotting Time: 164 seconds

## 2019-06-17 LAB — GLUCOSE, CAPILLARY: Glucose-Capillary: 250 mg/dL — ABNORMAL HIGH (ref 70–99)

## 2019-06-17 SURGERY — ABDOMINAL AORTOGRAM W/LOWER EXTREMITY
Anesthesia: LOCAL | Laterality: Left

## 2019-06-17 MED ORDER — HEPARIN SODIUM (PORCINE) 1000 UNIT/ML IJ SOLN
INTRAMUSCULAR | Status: AC
  Filled 2019-06-17: qty 1

## 2019-06-17 MED ORDER — HEPARIN (PORCINE) IN NACL 1000-0.9 UT/500ML-% IV SOLN
INTRAVENOUS | Status: AC
  Filled 2019-06-17: qty 1000

## 2019-06-17 MED ORDER — FENTANYL CITRATE (PF) 100 MCG/2ML IJ SOLN
INTRAMUSCULAR | Status: AC
  Filled 2019-06-17: qty 2

## 2019-06-17 MED ORDER — SODIUM CHLORIDE 0.9 % WEIGHT BASED INFUSION: 1.0000 mL/kg/h | INTRAVENOUS | Status: DC

## 2019-06-17 MED ORDER — IODIXANOL 320 MG/ML IV SOLN
INTRAVENOUS | Status: DC | PRN
  Administered 2019-06-17: 09:00:00 130 mL via INTRA_ARTERIAL

## 2019-06-17 MED ORDER — SODIUM CHLORIDE 0.9 % IV SOLN: 250.0000 mL | INTRAVENOUS | Status: DC | PRN

## 2019-06-17 MED ORDER — MIDAZOLAM HCL 2 MG/2ML IJ SOLN
INTRAMUSCULAR | Status: DC | PRN
  Administered 2019-06-17: 1 mg via INTRAVENOUS

## 2019-06-17 MED ORDER — ACETAMINOPHEN 325 MG PO TABS: 650.0000 mg | ORAL_TABLET | ORAL | Status: DC | PRN

## 2019-06-17 MED ORDER — HYDRALAZINE HCL 20 MG/ML IJ SOLN: 5.0000 mg | INTRAMUSCULAR | Status: DC | PRN

## 2019-06-17 MED ORDER — HEPARIN (PORCINE) IN NACL 1000-0.9 UT/500ML-% IV SOLN
INTRAVENOUS | Status: DC | PRN
  Administered 2019-06-17 (×2): 500 mL

## 2019-06-17 MED ORDER — FENTANYL CITRATE (PF) 100 MCG/2ML IJ SOLN
INTRAMUSCULAR | Status: DC | PRN
  Administered 2019-06-17: 25 ug via INTRAVENOUS

## 2019-06-17 MED ORDER — LABETALOL HCL 5 MG/ML IV SOLN
INTRAVENOUS | Status: AC
  Filled 2019-06-17: qty 4

## 2019-06-17 MED ORDER — LABETALOL HCL 5 MG/ML IV SOLN
10.0000 mg | INTRAVENOUS | Status: DC | PRN
  Administered 2019-06-17: 10 mg via INTRAVENOUS

## 2019-06-17 MED ORDER — HEPARIN SODIUM (PORCINE) 1000 UNIT/ML IJ SOLN
INTRAMUSCULAR | Status: DC | PRN
  Administered 2019-06-17: 9000 [IU] via INTRAVENOUS

## 2019-06-17 MED ORDER — MIDAZOLAM HCL 2 MG/2ML IJ SOLN
INTRAMUSCULAR | Status: AC
  Filled 2019-06-17: qty 2

## 2019-06-17 MED ORDER — LIDOCAINE HCL (PF) 1 % IJ SOLN
INTRAMUSCULAR | Status: AC
  Filled 2019-06-17: qty 30

## 2019-06-17 MED ORDER — LIDOCAINE HCL (PF) 1 % IJ SOLN
INTRAMUSCULAR | Status: DC | PRN
  Administered 2019-06-17: 15 mL

## 2019-06-17 MED ORDER — ONDANSETRON HCL 4 MG/2ML IJ SOLN: 4.0000 mg | Freq: Four times a day (QID) | INTRAMUSCULAR | Status: DC | PRN

## 2019-06-17 MED ORDER — SODIUM CHLORIDE 0.9% FLUSH: 3.0000 mL | Freq: Two times a day (BID) | INTRAVENOUS | Status: DC

## 2019-06-17 MED ORDER — SODIUM CHLORIDE 0.9 % IV SOLN: INTRAVENOUS | Status: DC

## 2019-06-17 MED ORDER — SODIUM CHLORIDE 0.9% FLUSH: 3.0000 mL | INTRAVENOUS | Status: DC | PRN

## 2019-06-17 SURGICAL SUPPLY — 24 items
BALLN IN.PACT DCB 5X40 (BALLOONS) ×2
BALLN MUSTANG 5.0X40 75 (BALLOONS) ×2
BALLOON MUSTANG 5.0X40 75 (BALLOONS) IMPLANT
CATH NAVICROSS ANG 65CM (CATHETERS) IMPLANT
CATH OMNI FLUSH 5F 65CM (CATHETERS) ×1 IMPLANT
CATH STRAIGHT 5FR 65CM (CATHETERS) ×1 IMPLANT
CATHETER NAVICROSS ANG 65CM (CATHETERS) ×2
DCB IN.PACT 5X40 (BALLOONS) IMPLANT
DEVICE TORQUE .025-.038 (MISCELLANEOUS) ×1 IMPLANT
GUIDEWIRE ANGLED .035X150CM (WIRE) ×1 IMPLANT
GUIDEWIRE ANGLED .035X260CM (WIRE) ×1 IMPLANT
KIT ENCORE 26 ADVANTAGE (KITS) ×1 IMPLANT
KIT MICROPUNCTURE NIT STIFF (SHEATH) ×1 IMPLANT
KIT PV (KITS) ×2 IMPLANT
SHEATH FLEX ANSEL ANG 6F 45CM (SHEATH) ×1 IMPLANT
SHEATH PINNACLE 5F 10CM (SHEATH) ×1 IMPLANT
SHEATH PROBE COVER 6X72 (BAG) ×1 IMPLANT
SYR MEDRAD MARK V 150ML (SYRINGE) ×1 IMPLANT
TRANSDUCER W/STOPCOCK (MISCELLANEOUS) ×2 IMPLANT
TRAY PV CATH (CUSTOM PROCEDURE TRAY) ×2 IMPLANT
WIRE AMPLATZ SS-J .035X180CM (WIRE) ×1 IMPLANT
WIRE BENTSON .035X145CM (WIRE) ×1 IMPLANT
WIRE HI TORQ VERSACORE J 260CM (WIRE) ×1 IMPLANT
WIRE ROSEN-J .035X260CM (WIRE) ×1 IMPLANT

## 2019-06-17 NOTE — Progress Notes (Signed)
Site area: right groin  Site Prior to Removal:  Level 0  Pressure Applied For 17 MINUTES    Minutes Beginning at 0955  Manual:   Yes.    Patient Status During Pull:  Stable  Post Pull Groin Site:  Level 0  Post Pull Instructions Given:  Yes.    Post Pull Pulses Present:  Yes.    Dressing Applied:  Yes.    Comments:  Bed rest started at 1015 x 4 hr.

## 2019-06-17 NOTE — Op Note (Signed)
Patient name: Danny Carpenter MRN: [id number] DOB: 09/03/1950 Sex: male  06/17/2019 Pre-operative Diagnosis: Threatened left common femoral to above-knee popliteal artery bypass with reversed ipsilateral translocated saphenous vein Post-operative diagnosis:  Same Surgeon:  Danny Heck, MD Procedure Performed: 1.  Ultrasound-guided access of the right common femoral artery 2.  Aortogram 3.  Left lower extremity arteriogram with selection of third order branches 4.  Left common femoral to above-knee popliteal artery bypass angioplasty x2 in the proximal and mid segment (5 mm x 40 mm Mustang and 5 mm x 40 mm drug-coated Impact) 5.  68 minutes of monitored moderate conscious sedation  Indications: Patient is a 69 year old male who previous underwent a left common femoral to above-knee popliteal artery bypass with vein by Dr. Trula Carpenter on 01/27/2019.  During recent surveillance noted to have increasing velocities in the proximal bypass and then in a second segment in the mid bypass with concern for >70% in each segment.  Ultimately presents today for planned arteriogram and possible intervention to maintain patency of long-term bypass after risks and benefits discussed.  Findings:   Aortogram showed patent renal arteries bilaterally as well as demonstrated abdominal aortic aneurysm that has been under previous surveillance.  He does have about a 50% calcified plaque in the left common iliac artery but this does not appear flow-limiting since there is very brisk flow down the left lower extremity and a great left femoral pulse including brisk flow down the bypass into the left lower extremity.  Identified a proximal lesion in the bypass at the common femoral anastomosis with approximate 60 to 70% stenosis.  This was angioplastied with a 5 mm Mustang and 5 mm drug-coated impact with no residual stenosis.  We angioplastied a second lesion in the mid bypass that looks more consistent with a valve.   There remains brisk flow down the bypass with two-vessel runoff via the peroneal and posterior tibial arteries and no residual stenosis.   Procedure:  The patient was identified in the holding area and taken to room 8.  The patient was then placed supine on the table and prepped and draped in the usual sterile fashion.  A time out was called.  Ultrasound was used to evaluate the right common femoral artery.  It was heavily diseased, but patent .  A digital ultrasound image was acquired.  A micropuncture needle was used to access the right common femoral artery under ultrasound guidance.  An 018 wire was advanced without resistance and a micropuncture sheath was placed.  The 018 wire was removed and a benson wire was placed.  The micropuncture sheath was exchanged for a 5 french sheath.  An omniflush catheter was advanced over the wire to the level of L-1.  An abdominal angiogram was obtained.  Next attempted to cross the iliac bifurcation to get down the left iliac artery.  He had a very calcified and fairly steep bifurcation.  Ultimately had to use a Glidewire to get down the left iliac.  I could not get the Omni- to track or get a 4 [place] straight flush catheter to track.  Ultimately we ended up using a glide catheter and went ahead and cannulated the bypass at the proximal stenosis across the lesion in order to get more support of our wire down the bypass.  We got the glide catheter to track and then we attempted to exchange for an Amplatz wire that would not cross the bifurcation and ultimately had to place a Rosen wire  for more support.  That point in time we then placed a 6 French Ansell sheath in the right common femoral artery and were able to get this over the bifurcation down the left side into the left common iliac.  Patient was given 100 units/kg heparin.  We then got additional planning shots and angioplastied the proximal bypass lesion with 5 mm x 40 mm Mustang to nominal pressure for 2 minutes.  I  then treated this with a 5 mm x 40 mm drug-coated impact for 2 minutes.  Once we were done I then went down and treated a second lesion in the mid bypass that I thought was consistent with a valve also with a 5 mm Mustang to nominal pressure for 2 minutes.  Final imaging showed brisk flow down the bypass with no residual stenosis.  There was preserved two-vessel runoff down the left lower extremity through the peroneal and posterior tibial arteries.  That point in time wires and catheters were removed.  I did not feel like a mynx closure was appropriate given a diseased common femoral and a be taken to holding to have his sheath removed.   Plan: We will arrange follow-up in 1 month with left lower extremity arterial duplex.  Danny Heck, MD [facility] Office: [phone]

## 2019-06-17 NOTE — Discharge Instructions (Signed)
° °  [facility] ° °Discharge Instructions ° °Lower Extremity Angiogram; Angioplasty/Stenting ° °Please refer to the following instructions for your post-procedure care. Your surgeon or physician assistant will discuss any changes with you. ° °Activity ° °Avoid lifting more than 8 pounds (1 gallons of milk) for 72 hours (3 days) after your procedure. You may walk as much as you can tolerate. It's OK to drive after 72 hours. ° °Bathing/Showering ° °You may shower the day after your procedure. If you have a bandage, you may remove it at 24- 48 hours. Clean your incision site with mild soap and water. Pat the area dry with a clean towel. ° °Diet ° °Resume your pre-procedure diet. There are no special food restrictions following this procedure. All patients with peripheral vascular disease should follow a low fat/low cholesterol diet. In order to heal from your surgery, it is CRITICAL to get adequate nutrition. Your body requires vitamins, minerals, and protein. Vegetables are the best source of vitamins and minerals. Vegetables also provide the perfect balance of protein. Processed food has little nutritional value, so try to avoid this. ° °Medications ° °Resume taking all of your medications unless your doctor tells you not to. If your incision is causing pain, you may take over-the-counter pain relievers such as acetaminophen (Tylenol) ° °Follow Up ° °Follow up will be arranged at the time of your procedure. You may have an office visit scheduled or may be scheduled for surgery. Ask your surgeon if you have any questions. ° °Please call us immediately for any of the following conditions: °•Severe or worsening pain your legs or feet at rest or with walking. °•Increased pain, redness, drainage at your groin puncture site. °•Fever of 101 degrees or higher. °•If you have any mild or slow bleeding from your puncture site: lie down, apply firm constant pressure over the area with a piece of  gauze or a clean wash cloth for 30 minutes- no peeking!, call 911 right away if you are still bleeding after 30 minutes, or if the bleeding is heavy and unmanageable. ° °Reduce your risk factors of vascular disease: ° °Stop smoking. If you would like help call [facility] at 1-800-QUIT-NOW (1-800-784-8669) or [place] at [phone]. °Manage your cholesterol °Maintain a desired weight °Control your diabetes °Keep your blood pressure down ° °If you have any questions, please call the office at [phone] ° °

## 2019-06-17 NOTE — H&P (Signed)
History and Physical Interval Note:  06/17/2019 7:26 AM  Danny Carpenter  has presented today for surgery, with the diagnosis of PAD.  The various methods of treatment have been discussed with the patient and family. After consideration of risks, benefits and other options for treatment, the patient has consented to  Procedure(s): ABDOMINAL AORTOGRAM W/LOWER EXTREMITY (Bilateral) as a surgical intervention.  The patient's history has been reviewed, patient examined, no change in status, stable for surgery.  I have reviewed the patient's chart and labs.  Questions were answered to the patient's satisfaction.    Lower extremity arteriogram and evaluate high risk bypass given recent surveillance.  Danny Carpenter  Office Note  CC: follow up  Requesting Provider: Melony Overly, MD  HPI: Danny Carpenter is a 69 y.o. (December 04, 1950) male who presents for follow up s/p left common femoral and profundofemoral endarterectomy with vein patch angioplasty, and left femoral to above knee popliteal artery bypass graft with reverse ipsilateral translocated saphenous vein on 01/27/19 by Dr. Trula Slade due to left foot ulceration. He has been following up with [facility] for his left 5th ray amputation, which is healing well. He completed his course of IV antibiotics. Today he denies any claudication symptoms, new ulcerations or non healing wounds, or rest pain in either bilateral lower extremities. He does have some concerns about the bony prominences in his left foot secondary to his 5th toe amputation. Otherwise no new medical problems since he was last seen.  Additionally he was noted to have a 4.6 cm AAA at last visit in 02/17/19. He remains asymptomatic. No low back pain, abdominal pain, or lower extremity symptoms.  The pt on a statin for cholesterol management.  The pt on a daily aspirin. Other AC: Plavix  The pt on Lisinopril for hypertension.  The pt is diabetic on insulin  Tobacco hx: Former smoker       Past Medical History:  Diagnosis Date  . AAA (abdominal aortic aneurysm) ([place])   . Anxiety   . Arthritis   . CHF (congestive heart failure) ([place])   . COPD (chronic obstructive pulmonary disease) ([place])   . CVA (cerebral vascular accident) ([place])   . Depression   . DM2 (diabetes mellitus, type 2) ([place])   . Fracture of rib    Left  . GERD (gastroesophageal reflux disease)   . HTN (hypertension)   . Hypercholesteremia   . Myocardial infarction ([place])    "light" heart attack 15 years ago (per pt 01/26/19)  . Peripheral vascular disease ([place])   . PONV (postoperative nausea and vomiting)   . Seizure [facility])    one time in Oct. 2020  . Sleep apnea    no cpap        Past Surgical History:  Procedure Laterality Date  . ABDOMINAL AORTOGRAM W/LOWER EXTREMITY N/A 01/19/2019   Procedure: ABDOMINAL AORTOGRAM W/LOWER EXTREMITY; Surgeon: Serafina Mitchell, MD; Location: [place] [facility]; Service: Cardiovascular; Laterality: N/A;  . AMPUTATION Left 02/22/2019   Procedure: LEFT FOOT 5TH RAY AMPUTATION; Surgeon: Newt Minion, MD; Location: [place]; Service: Orthopedics; Laterality: Left;  . APPENDECTOMY    . ENDARTERECTOMY FEMORAL Left 01/27/2019   Procedure: ENDARTERECTOMY LEFT FEMORAL ARTERY AND PROFUNDA ARTERY; Surgeon: Serafina Mitchell, MD; Location: [place]; Service: Vascular; Laterality: Left;  . FEMORAL-POPLITEAL BYPASS GRAFT Left 01/27/2019   Procedure: BYPASS GRAFT FEMORAL-POPLITEAL ARTERY; Surgeon: Serafina Mitchell, MD; Location: [place]; Service: Vascular; Laterality: Left;  . FINGER AMPUTATION     Left  second and third fingers, work accident  . KNEE SURGERY Left   . LOWER EXTREMITY ANGIOGRAM  05/2008   Left SFA atherectomy with diamondback 2.25 utilizing distal protection device NAV6 placed in the left popliteal artery. Angioplasty of the left SFA with ATV balloon 6 x 40   . PATCH ANGIOPLASTY Left 01/27/2019   Procedure: Patch Angioplasty using Vein; Surgeon: Serafina Mitchell, MD;  Location: [place]; Service: Vascular; Laterality: Left;  . SHOULDER SURGERY Right   . VEIN HARVEST Left 01/27/2019   Procedure: Left Saphenous Vein Harvest; Surgeon: Serafina Mitchell, MD; Location: [facility]; Service: Vascular; Laterality: Left;   Social History        Socioeconomic History  . Marital status: Married    Spouse name: Not on file  . Number of children: Not on file  . Years of education: Not on file  . Highest education level: Not on file  Occupational History  . Occupation: Disabled Administrator  Tobacco Use  . Smoking status: Former Smoker    Packs/day: 1.00    Years: 40.00    Pack years: 40.00    Types: Cigarettes    Quit date: 04/01/2009    Years since quitting: 10.1  . Smokeless tobacco: Never Used  Substance and Sexual Activity  . Alcohol use: Not Currently  . Drug use: Never  . Sexual activity: Not on file  Other Topics Concern  . Not on file  Social History Narrative   Lives with wife in [place]   Social Determinants of Health      Financial Resource Strain:   . Difficulty of Paying Living Expenses: Not on file  Food Insecurity:   . Worried About Charity fundraiser in the Last Year: Not on file  . Ran Out of Food in the Last Year: Not on file  Transportation Needs:   . Lack of Transportation (Medical): Not on file  . Lack of Transportation (Non-Medical): Not on file  Physical Activity:   . Days of Exercise per Week: Not on file  . Minutes of Exercise per Session: Not on file  Stress:   . Feeling of Stress : Not on file  Social Connections:   . Frequency of Communication with Friends and Family: Not on file  . Frequency of Social Gatherings with Friends and Family: Not on file  . Attends Religious Services: Not on file  . Active Member of Clubs or Organizations: Not on file  . Attends Archivist Meetings: Not on file  . Marital Status: Not on file  Intimate Partner Violence:   . Fear of Current or Ex-Partner: Not on file  .  Emotionally Abused: Not on file  . Physically Abused: Not on file  . Sexually Abused: Not on file   History reviewed. No pertinent family history.        Current Outpatient Medications  Medication Sig Dispense Refill  . amLODipine (NORVASC) 10 MG tablet Take 1 tablet (10 mg total) by mouth daily. 30 tablet 0  . carvedilol (COREG CR) 40 MG 24 hr capsule Take 40 mg by mouth at bedtime.    . clopidogrel (PLAVIX) 75 MG tablet Take 75 mg by mouth daily.    . diflunisal (DOLOBID) 500 MG TABS tablet Take 500 mg by mouth 2 (two) times daily as needed (for pain management- take with food or milk).    . DULoxetine (CYMBALTA) 60 MG capsule Take 60 mg by mouth daily.    . fenofibrate 160 MG  tablet Take 160 mg by mouth daily.    . fexofenadine (ALLEGRA) 180 MG tablet Take 180 mg by mouth daily as needed for allergies or rhinitis.    . Gabapentin, Once-Daily, (GRALISE) 600 MG TABS Take 1,200 mg by mouth at bedtime.    . hydroxypropyl methylcellulose / hypromellose (ISOPTO TEARS / GONIOVISC) 2.5 % ophthalmic solution Place 1 drop into both eyes daily as needed for dry eyes.    . insulin aspart (NOVOLOG FLEXPEN) 100 UNIT/ML FlexPen Inject 6-8 Units into the skin See admin instructions. Inject 6-8 units into the skin two times a day (morning and early afternoon) as needed, per sliding scale: BGL 300-350 = 6 units; 351-400 = 8 units; >400 = CALL [facility]    . Insulin Degludec-Liraglutide (XULTOPHY) 100-3.6 UNIT-MG/ML SOPN Inject 22 Units into the skin daily before breakfast.     . levETIRAcetam (KEPPRA) 500 MG tablet Take 1 tablet (500 mg total) by mouth 2 (two) times daily. 60 tablet 0  . linezolid (ZYVOX) 600 MG tablet Take 600 mg by mouth 2 (two) times daily.    Marland Kitchen lisinopril (ZESTRIL) 40 MG tablet Take 40 mg by mouth daily.    Marland Kitchen morphine (MS CONTIN) 15 MG 12 hr tablet Take 15 mg by mouth every 12 (twelve) hours.    . pantoprazole (PROTONIX) 40 MG tablet Take 40 mg by mouth daily before breakfast.    .  Probiotic Product (ALIGN) 4 MG CAPS Take 4 mg by mouth daily.    . riboflavin (VITAMIN B-2) 100 MG TABS tablet Take 100 mg by mouth daily.    . rosuvastatin (CRESTOR) 20 MG tablet Take 20 mg by mouth daily.    . sodium chloride 1 g tablet Take 2 g by mouth See admin instructions. Take 2 grams by mouth in the morning and 2 grams at bedtime    . spironolactone (ALDACTONE) 25 MG tablet Take 25 mg by mouth daily.    . tamsulosin (FLOMAX) 0.4 MG CAPS capsule Take 0.4 mg by mouth at bedtime.    . traZODone (DESYREL) 100 MG tablet Take 100 mg by mouth at bedtime.     No current facility-administered medications for this visit.   No Known Allergies  REVIEW OF SYSTEMS:  Left sided back and chest pain secondary to fall- recently broke several ribs  [X]  denotes positive finding, [ ]  denotes negative finding  Cardiac  Comments:  Chest pain or chest pressure:    Shortness of breath upon exertion:    Short of breath when lying flat:    Irregular heart rhythm:        Vascular    Pain in calf, thigh, or hip brought on by ambulation:    Pain in feet at night that wakes you up from your sleep:     Blood clot in your veins:    Leg swelling:         Pulmonary    Oxygen at home:    Productive cough:     Wheezing:         Neurologic    Sudden weakness in arms or legs:     Sudden numbness in arms or legs:     Sudden onset of difficulty speaking or slurred speech:    Temporary loss of vision in one eye:     Problems with dizziness:         Gastrointestinal    Blood in stool:     Vomited blood:  Genitourinary    Burning when urinating:     Blood in urine:        Psychiatric    Major depression:         Hematologic    Bleeding problems:    Problems with blood clotting too easily:        Skin    Rashes or ulcers:        Constitutional    Fever or chills:    PHYSICAL EXAMINATION:     Vitals:   05/13/19 1506  BP: (!) 174/86  Pulse: 87  Temp: 98.2 F (36.8 C)  TempSrc:  Temporal  SpO2: 98%  Weight: 211 lb 9.6 oz (96 kg)  Height: 5\' 11"  (1.803 m)   General: WDWN in NAD  Gait: normal  HENT: WNL, normocephalic  Pulmonary: normal non-labored breathing , without Rales, rhonchi, wheezing  Cardiac: regular HR, without Murmurs without carotid bruit  Abdomen: soft, NT, no masses. Normal bowel sounds  Skin: without rashes  Vascular Exam/Pulses:   Right Left  Radial 2+ (normal) 2+ (normal)  Ulnar 2+ (normal) 2+ (normal)  Femoral 2+ (hyperdynamic) 2+ (normal)  Popliteal 1+ (weak) absent  DP Doppler Doppler  PT Doppler Doppler  Extremities: without ischemic changes, without Gangrene , without cellulitis; without open wounds; Left 5th ray amputation site healed. Collapsed arch and bunion present on left foot  Musculoskeletal: no muscle wasting or atrophy  Neurologic: A&O X 3; No focal weakness or paresthesias are detected  Psychiatric: The pt has Normal affect.  Non-Invasive Vascular Imaging:  Left Graft #1:  +-------------------+--------+------------+----------+---------------------  ----+   PSV cm/sStenosis Waveform Comments    +-------------------+--------+------------+----------+---------------------  ----+  Inflow 202  monophasic    +-------------------+--------+------------+----------+---------------------  ----+  Proximal 417 >70% monophasic    Anastomosis  stenosis      +-------------------+--------+------------+----------+---------------------  ----+  Proximal Graft 135  monophasic    +-------------------+--------+------------+----------+---------------------  ----+  Mid Graft 311 >70% monophasic      stenosis      +-------------------+--------+------------+----------+---------------------  ----+  Distal Graft 93  monophasic    +-------------------+--------+------------+----------+---------------------  ----+  Distal Anastomosis 132  monophasicnot adequately         visualized.    +-------------------+--------+------------+----------+---------------------  ----+  Outflow 82  monophasic    +-------------------+--------+------------+----------+---------------------  ----+    Summary:  Left: Patent graft with >70% stenosis at the proximal anastomosis and in  the mid graft.  He was noted to have a 4.6 cm AAA at last visit in 02/17/19.  ASSESSMENT/PLAN:: 69 y.o. male here for follow up for s/p left common femoral and profundofemoral endarterectomy with vein patch angioplasty, and left femoral to above knee popliteal artery bypass graft with reverse ipsilateral translocated saphenous vein by Dr. Etta Quill. Left foot remains healed. No new symptoms LLE. LLE graft duplex today concerning for potential compromise of graft with elevated velocities both at the proximal anastomosis and in the mid graft. ABI however is stable. I discussed with patient concern for the graft being at risk and potential for it to be come occluded. Recommend Aortogram with LLE runoff with possible intervention. He is agreeable. Risk/ alternatives of the procedure were discussed with him. I also discussed symptoms with him that would warrant immediate presentation to the emergency room. He will continue his Aspirin, Plavix and Crestor.  Aortogram with LLE runoff with possible intervention scheduled with Dr. Trula Slade. Patient will follow up in the office for ABI and duplex 4-6 weeks post intervention  Corrina Baglia, PA-C  [facility]  [phone]  Clinic MD: Dr. Scot Dock

## 2019-06-23 ENCOUNTER — Telehealth: Payer: Self-pay

## 2019-06-23 NOTE — Telephone Encounter (Signed)
Ok I will see him tomorrow and we will send updated orders then -Dr. Chauncey Cruel

## 2019-06-23 NOTE — Telephone Encounter (Signed)
NP-Brittney Ronnald Ramp from [facility] called concerning Pt Danny Carpenter's wound. NP stated the medihoney was making the wound worse, they dc the medihoney and started applying dermafiber with a dry dressin. The Dermafiber was helping for a couple of days, and was making the wound smaller and closing it up. NP states Pt is hard of following directions and went back to the medihoney and they think the wound looks worse again. Also, NP wants to point out that the pt had the vascular study Thursday and they found a lesion on his Lt, but the pulse was good and had good sensation. Also, NP wants for Dr. Cannon Kettle know and be aware that the pt has custom shoes made by them to help with the sore. FYI- NP wants Dr's notes or wound orders to to be faxed to her after each visit or call her directly with the new wound infromation. Also, every referral that Dr. Cannon Kettle needs the pt to have needs to go through them first and they will be the ones scheduling the pt's referral apt

## 2019-06-24 ENCOUNTER — Encounter: Payer: Self-pay | Admitting: Sports Medicine

## 2019-06-24 ENCOUNTER — Ambulatory Visit (INDEPENDENT_AMBULATORY_CARE_PROVIDER_SITE_OTHER): Payer: Medicare Other | Admitting: Sports Medicine

## 2019-06-24 ENCOUNTER — Other Ambulatory Visit: Payer: Self-pay

## 2019-06-24 DIAGNOSIS — Z89422 Acquired absence of other left toe(s): Secondary | ICD-10-CM | POA: Diagnosis not present

## 2019-06-24 DIAGNOSIS — M79672 Pain in left foot: Secondary | ICD-10-CM

## 2019-06-24 DIAGNOSIS — L97521 Non-pressure chronic ulcer of other part of left foot limited to breakdown of skin: Secondary | ICD-10-CM | POA: Diagnosis not present

## 2019-06-24 DIAGNOSIS — E11610 Type 2 diabetes mellitus with diabetic neuropathic arthropathy: Secondary | ICD-10-CM | POA: Diagnosis not present

## 2019-06-24 DIAGNOSIS — M21962 Unspecified acquired deformity of left lower leg: Secondary | ICD-10-CM

## 2019-06-24 NOTE — Progress Notes (Signed)
Subjective: Danny Carpenter is a 69 y.o. male patient seen in office for follow-up evaluation left foot ulcer.  Patient reports that it looks the same redness is less but still has swelling and reports that he has gotten shoes from [facility] and they still rub reports that the nurses asked they will told him to throw away the Medihoney that I gave him last visit even though he thought it was helping as well as to stop wearing the surgical shoe.  Patient is getting depressed and frustrated with the fact that they keep changing things when he goes to [facility] and telling him to do other treatment against what I am saying.  Patient is assisted by son and grandson this visit who also helps report the same history.  Patient denies nausea vomiting fever chills and reports that his blood sugar was around 320 today.  No other pedal complaints noted.  Patient Active Problem List   Diagnosis Date Noted  . AAA (abdominal aortic aneurysm) without rupture ([place]) 06/11/2019  . Hyperlipidemia 06/11/2019  . PICC (peripherally inserted central catheter) in place 02/16/2019  . Encounter for medication management 02/16/2019  . PAD (peripheral artery disease) ([place]) 01/27/2019  . Type 2 diabetes mellitus with other specified complication ([place])   . Subacute osteomyelitis of left foot ([place])   . PVD (peripheral vascular disease) ([place])   . CVA (cerebral vascular accident) ([place]) 01/17/2019  . Diabetic foot infection ([place]) 01/17/2019  . Weight loss 03/19/2015  . Chest wall pain 02/06/2015  . Chronic fatigue 01/09/2015  . Eye pain 09/23/2014  . Pain medication agreement completed 02/06/2014  . Chronic pain 04/05/2013  . Senile nuclear sclerosis 12/06/2011  . Tear film insufficiency 12/06/2011  . Depression 01/07/2005  . DDD (degenerative disc disease) 10/08/2004  . Proteinuria 10/21/2002   Current Outpatient Medications on File Prior to Visit  Medication Sig Dispense Refill  . amLODipine (NORVASC) 10 MG tablet Take  1 tablet (10 mg total) by mouth daily. 30 tablet 0  . carvedilol (COREG CR) 40 MG 24 hr capsule Take 40 mg by mouth at bedtime.    . clopidogrel (PLAVIX) 75 MG tablet Take 75 mg by mouth daily.    . diflunisal (DOLOBID) 500 MG TABS tablet Take 500 mg by mouth 2 (two) times daily as needed (for pain management- take with food or milk).    . DULoxetine (CYMBALTA) 60 MG capsule Take 60 mg by mouth daily.    . fenofibrate 160 MG tablet Take 160 mg by mouth daily.    . fexofenadine (ALLEGRA) 180 MG tablet Take 180 mg by mouth daily as needed for allergies or rhinitis.    . Gabapentin, Once-Daily, (GRALISE) 600 MG TABS Take 1,200 mg by mouth at bedtime.    . hydroxypropyl methylcellulose / hypromellose (ISOPTO TEARS / GONIOVISC) 2.5 % ophthalmic solution Place 1 drop into both eyes daily as needed for dry eyes.    . insulin aspart (NOVOLOG FLEXPEN) 100 UNIT/ML FlexPen Inject 6-8 Units into the skin See admin instructions. Inject 6-8 units into the skin two times a day (morning and early afternoon) as needed, per sliding scale: BGL 300-350 = 6 units; 351-400 = 8 units; >400 = CALL STAYWELL    . Insulin Degludec-Liraglutide (XULTOPHY) 100-3.6 UNIT-MG/ML SOPN Inject 22 Units into the skin daily before breakfast.     . levETIRAcetam (KEPPRA) 500 MG tablet Take 1 tablet (500 mg total) by mouth 2 (two) times daily. 60 tablet 0  . linezolid (ZYVOX)  600 MG tablet Take 600 mg by mouth 2 (two) times daily.    Marland Kitchen lisinopril (ZESTRIL) 40 MG tablet Take 40 mg by mouth daily.    Marland Kitchen morphine (MS CONTIN) 15 MG 12 hr tablet Take 15 mg by mouth every 12 (twelve) hours.    . pantoprazole (PROTONIX) 40 MG tablet Take 40 mg by mouth daily before breakfast.    . Probiotic Product (ALIGN) 4 MG CAPS Take 4 mg by mouth daily.    . riboflavin (VITAMIN B-2) 100 MG TABS tablet Take 100 mg by mouth daily.    . rosuvastatin (CRESTOR) 20 MG tablet Take 20 mg by mouth daily.    . sodium chloride 1 g tablet Take 2 g by mouth See admin  instructions. Take 2 grams by mouth in the morning and 2 grams at bedtime    . spironolactone (ALDACTONE) 25 MG tablet Take 25 mg by mouth daily.    Marland Kitchen sulfamethoxazole-trimethoprim (BACTRIM) 400-80 MG tablet Take 1 tablet by mouth 2 (two) times daily. 28 tablet 0  . tamsulosin (FLOMAX) 0.4 MG CAPS capsule Take 0.4 mg by mouth at bedtime.    . traZODone (DESYREL) 100 MG tablet Take 100 mg by mouth at bedtime.     No current facility-administered medications on file prior to visit.   No Known Allergies  Recent Results (from the past 2160 hour(s))  I-STAT, chem 8     Status: Abnormal   Collection Time: 06/17/19  5:53 AM  Result Value Ref Range   Sodium 131 (L) 135 - 145 mmol/L   Potassium 4.2 3.5 - 5.1 mmol/L   Chloride 95 (L) 98 - 111 mmol/L   BUN 24 (H) 8 - 23 mg/dL   Creatinine, Ser 0.90 0.61 - 1.24 mg/dL   Glucose, Bld 259 (H) 70 - 99 mg/dL    Comment: Glucose reference range applies only to samples taken after fasting for at least 8 hours.   Calcium, Ion 1.09 (L) 1.15 - 1.40 mmol/L   TCO2 27 22 - 32 mmol/L   Hemoglobin 12.6 (L) 13.0 - 17.0 g/dL   HCT 37.0 (L) 39.0 - 52.0 %  POCT Activated clotting time     Status: None   Collection Time: 06/17/19  8:46 AM  Result Value Ref Range   Activated Clotting Time 197 seconds  Glucose, capillary     Status: Abnormal   Collection Time: 06/17/19  9:29 AM  Result Value Ref Range   Glucose-Capillary 250 (H) 70 - 99 mg/dL    Comment: Glucose reference range applies only to samples taken after fasting for at least 8 hours.   Comment 1 Document in Chart   POCT Activated clotting time     Status: None   Collection Time: 06/17/19  9:31 AM  Result Value Ref Range   Activated Clotting Time 164 seconds    Objective: There were no vitals filed for this visit.  General: Patient is awake, alert, oriented x 3 and in no acute distress.  Dermatology: Skin is warm and dry bilateral with a partial thickness ulceration present medial great toe on  the left. Ulceration measures 2 cm x 1 cm x 0.2cm, post debridement. There is a mildly macerated border with a eschlerotic base once removed revealed a granular base. The ulceration does not probe to bone. There is no malodor, no active drainage, blanchable erythema, focal edema.  Previous fifth amputation site healed on left, no acute signs of infection.   Vascular: Dorsalis Pedis pulse =  0/4 Bilateral,  Posterior Tibial pulse = 0/4 Bilateral,  Capillary Fill Time < 5 seconds  Neurologic: Protective sensation severely diminished on the left.  Musculosketal: There is significant left foot deformity patient has a Z foot type with a very large bunion and crossover deformity of the hallux onto the lesser toes with protrusion of the fifth metatarsal base at previous amputation stump site on left.  No results for input(s): GRAMSTAIN, LABORGA in the last 8760 hours.  Assessment and Plan:  Problem List Items Addressed This Visit    None    Visit Diagnoses    Ulcer of left foot, limited to breakdown of skin ([place])    -  Primary   Charcot foot due to diabetes mellitus ([place])       History of amputation of lesser toe of left foot ([facility])       Foot deformity, left       Left foot pain          -Examined patient and discussed the progression of the wound and treatment alternatives. - Excisionally dedbrided ulceration at left great toe joint to healthy bleeding borders removing nonviable tissue using a sterile chisel blade. Wound measures post debridement as above.  Wound was debrided to the level of the dermis with viable wound base exposed to promote healing. Hemostasis was achieved with manuel pressure. Patient tolerated procedure well without any discomfort or anesthesia necessary for this wound debridement.  -Applied Betadine and dry sterile dressing and instructed patient to continue with daily dressings at home consisting of the same every other day - Advised patient that I will call [facility] to go  over her clear instructions with them to make sure they are not doing anything that will be counterproductive to what I am recommending -I advised patient to return back to wearing his surgical shoe because the shoe that they provide [facility] at [facility] is still rubbing and is not custom enough for his foot deformity -Continue with Bactrim antibiotic until completed -T with vascular follow-up - Advised patient to go to the ER or return to office if the wound worsens or if constitutional symptoms are present. -Patient to return to office for continued wound care and reevaluation next week.  Landis Martins, DPM

## 2019-07-01 ENCOUNTER — Other Ambulatory Visit: Payer: Self-pay

## 2019-07-01 ENCOUNTER — Encounter: Payer: Self-pay | Admitting: Sports Medicine

## 2019-07-01 ENCOUNTER — Ambulatory Visit (INDEPENDENT_AMBULATORY_CARE_PROVIDER_SITE_OTHER): Payer: Medicare Other | Admitting: Sports Medicine

## 2019-07-01 DIAGNOSIS — L97521 Non-pressure chronic ulcer of other part of left foot limited to breakdown of skin: Secondary | ICD-10-CM | POA: Diagnosis not present

## 2019-07-01 DIAGNOSIS — M79672 Pain in left foot: Secondary | ICD-10-CM

## 2019-07-01 DIAGNOSIS — E11610 Type 2 diabetes mellitus with diabetic neuropathic arthropathy: Secondary | ICD-10-CM | POA: Diagnosis not present

## 2019-07-01 DIAGNOSIS — M21962 Unspecified acquired deformity of left lower leg: Secondary | ICD-10-CM | POA: Diagnosis not present

## 2019-07-01 DIAGNOSIS — Z89422 Acquired absence of other left toe(s): Secondary | ICD-10-CM | POA: Diagnosis not present

## 2019-07-01 NOTE — Progress Notes (Signed)
Subjective: Danny Carpenter is a 69 y.o. male patient seen in office for follow-up evaluation left foot ulcer.  Patient reports that to him it looks a little worse however it does have less redness and swelling with the same amount of drainage his daughter has been helping with applying Betadine and dry dressing as well as [facility] they have been helping to care for this wound.  Patient reports that he has been using surgical shoe because he cannot walk with the big boot that they [facility] at [facility].  Patient denies nausea vomiting fever chills and reports that his blood sugar was 283.  Patient is assisted by Dr[facility] Charlet[facility]in from [facility] Patient is assisted by son this visit as well  No other pedal complaints noted.  Patient Active Problem List   Diagnosis Date Noted  . AAA (abdominal aortic aneurysm) without rupture ([place]) 06/11/2019  . Hyperlipidemia 06/11/2019  . PICC (peripherally inserted central catheter) in place 02/16/2019  . Encounter for medication management 02/16/2019  . PAD (peripheral artery disease) ([place]) 01/27/2019  . Type 2 diabetes mellitus with other specified complication ([place])   . Subacute osteomyelitis of left foot ([place])   . PVD (peripheral vascular disease) ([place])   . CVA (cerebral vascular accident) ([place]) 01/17/2019  . Diabetic foot infection ([place]) 01/17/2019  . Weight loss 03/19/2015  . Chest wall pain 02/06/2015  . Chronic fatigue 01/09/2015  . Eye pain 09/23/2014  . Pain medication agreement completed 02/06/2014  . Chronic pain 04/05/2013  . Senile nuclear sclerosis 12/06/2011  . Tear film insufficiency 12/06/2011  . Depression 01/07/2005  . DDD (degenerative disc disease) 10/08/2004  . Proteinuria 10/21/2002   Current Outpatient Medications on File Prior to Visit  Medication Sig Dispense Refill  . amLODipine (NORVASC) 10 MG tablet Take 1 tablet (10 mg total) by mouth daily. 30 tablet 0  . carvedilol (COREG CR) 40 MG 24 hr capsule Take 40 mg by mouth  at bedtime.    . clopidogrel (PLAVIX) 75 MG tablet Take 75 mg by mouth daily.    . diflunisal (DOLOBID) 500 MG TABS tablet Take 500 mg by mouth 2 (two) times daily as needed (for pain management- take with food or milk).    . DULoxetine (CYMBALTA) 60 MG capsule Take 60 mg by mouth daily.    . fenofibrate 160 MG tablet Take 160 mg by mouth daily.    . fexofenadine (ALLEGRA) 180 MG tablet Take 180 mg by mouth daily as needed for allergies or rhinitis.    . Gabapentin, Once-Daily, (GRALISE) 600 MG TABS Take 1,200 mg by mouth at bedtime.    . hydroxypropyl methylcellulose / hypromellose (ISOPTO TEARS / GONIOVISC) 2.5 % ophthalmic solution Place 1 drop into both eyes daily as needed for dry eyes.    . insulin aspart (NOVOLOG FLEXPEN) 100 UNIT/ML FlexPen Inject 6-8 Units into the skin See admin instructions. Inject 6-8 units into the skin two times a day (morning and early afternoon) as needed, per sliding scale: BGL 300-350 = 6 units; 351-400 = 8 units; >400 = CALL [facility]    . Insulin Degludec-Liraglutide (XULTOPHY) 100-3.6 UNIT-MG/ML SOPN Inject 22 Units into the skin daily before breakfast.     . levETIRAcetam (KEPPRA) 500 MG tablet Take 1 tablet (500 mg total) by mouth 2 (two) times daily. 60 tablet 0  . linezolid (ZYVOX) 600 MG tablet Take 600 mg by mouth 2 (two) times daily.    Marland Kitchen lisinopril (ZESTRIL) 40 MG tablet  Take 40 mg by mouth daily.    Marland Kitchen morphine (MS CONTIN) 15 MG 12 hr tablet Take 15 mg by mouth every 12 (twelve) hours.    . pantoprazole (PROTONIX) 40 MG tablet Take 40 mg by mouth daily before breakfast.    . Probiotic Product (ALIGN) 4 MG CAPS Take 4 mg by mouth daily.    . riboflavin (VITAMIN B-2) 100 MG TABS tablet Take 100 mg by mouth daily.    . rosuvastatin (CRESTOR) 20 MG tablet Take 20 mg by mouth daily.    . sodium chloride 1 g tablet Take 2 g by mouth See admin instructions. Take 2 grams by mouth in the morning and 2 grams at bedtime    . spironolactone (ALDACTONE) 25 MG tablet  Take 25 mg by mouth daily.    Marland Kitchen sulfamethoxazole-trimethoprim (BACTRIM) 400-80 MG tablet Take 1 tablet by mouth 2 (two) times daily. 28 tablet 0  . tamsulosin (FLOMAX) 0.4 MG CAPS capsule Take 0.4 mg by mouth at bedtime.    . traZODone (DESYREL) 100 MG tablet Take 100 mg by mouth at bedtime.     No current facility-administered medications on file prior to visit.   No Known Allergies  Recent Results (from the past 2160 hour(s))  I-STAT, chem 8     Status: Abnormal   Collection Time: 06/17/19  5:53 AM  Result Value Ref Range   Sodium 131 (L) 135 - 145 mmol/L   Potassium 4.2 3.5 - 5.1 mmol/L   Chloride 95 (L) 98 - 111 mmol/L   BUN 24 (H) 8 - 23 mg/dL   Creatinine, Ser 0.90 0.61 - 1.24 mg/dL   Glucose, Bld 259 (H) 70 - 99 mg/dL    Comment: Glucose reference range applies only to samples taken after fasting for at least 8 hours.   Calcium, Ion 1.09 (L) 1.15 - 1.40 mmol/L   TCO2 27 22 - 32 mmol/L   Hemoglobin 12.6 (L) 13.0 - 17.0 g/dL   HCT 37.0 (L) 39.0 - 52.0 %  POCT Activated clotting time     Status: None   Collection Time: 06/17/19  8:46 AM  Result Value Ref Range   Activated Clotting Time 197 seconds  Glucose, capillary     Status: Abnormal   Collection Time: 06/17/19  9:29 AM  Result Value Ref Range   Glucose-Capillary 250 (H) 70 - 99 mg/dL    Comment: Glucose reference range applies only to samples taken after fasting for at least 8 hours.   Comment 1 Document in Chart   POCT Activated clotting time     Status: None   Collection Time: 06/17/19  9:31 AM  Result Value Ref Range   Activated Clotting Time 164 seconds    Objective: There were no vitals filed for this visit.  General: Patient is awake, alert, oriented x 3 and in no acute distress.  Dermatology: Skin is warm and dry bilateral with a partial thickness ulceration present medial great toe on the left. Ulceration measures 2.5 cm x 2 cm x 0.2cm, post debridement. There is a mildly macerated border with a  fibrogranular base. The ulceration does not probe to bone. There is no malodor, no active drainage, blanchable erythema, focal edema.  Previous fifth amputation site healed on left, no acute signs of infection.   Vascular: Dorsalis Pedis pulse = 0/4 Bilateral,  Posterior Tibial pulse = 0/4 Bilateral,  Capillary Fill Time < 5 seconds  Neurologic: Protective sensation severely diminished on the left.  Musculosketal: There is significant left foot deformity patient has a Z foot type with a very large bunion and crossover deformity of the hallux onto the lesser toes with protrusion of the fifth metatarsal base at previous amputation stump site on left.  No results for input(s): GRAMSTAIN, LABORGA in the last 8760 hours.  Assessment and Plan:  Problem List Items Addressed This Visit    None    Visit Diagnoses    Ulcer of left foot, limited to breakdown of skin ([place])    -  Primary   Charcot foot due to diabetes mellitus ([place])       History of amputation of lesser toe of left foot ([facility])       Foot deformity, left       Left foot pain          -Examined patient and discussed the progression of the wound and treatment alternatives. -Discussed plan of care with Dr. Marcello Moores -Cleanse ulceration and applied antibiotic cream and dry dressing and recommend at this time for patient to start using Santyl daily to the area with assistance from [facility] with dressing changes and his daughter -Advised patient that he must be compliant with staying off of the foot as much as possible to prevent his Charcot fractures from worsening -Continue with surgical shoe until he can be seen by physical therapy for them to educate patient on how to properly walk with the cam boot I advised patient if he is still struggling with the cam boot and he is walking better with the surgical shoe then he must continue with this he needs offloading at this time to prevent worsening of Charcot fractures -No additional antibiotics  by mouth are needed at this time since there are no acute signs of infection -Continue with vascular follow-up and continue to encourage medical management in order to assist with wound healing - Advised patient to return to office in 2 weeks for follow-up evaluation we will contact [facility] to arrange patient's next appointment.  Landis Martins, DPM

## 2019-07-05 ENCOUNTER — Ambulatory Visit: Payer: No Typology Code available for payment source | Admitting: Surgery

## 2019-07-15 ENCOUNTER — Ambulatory Visit (INDEPENDENT_AMBULATORY_CARE_PROVIDER_SITE_OTHER): Payer: Medicare Other | Admitting: Sports Medicine

## 2019-07-15 ENCOUNTER — Encounter: Payer: Self-pay | Admitting: Sports Medicine

## 2019-07-15 ENCOUNTER — Other Ambulatory Visit: Payer: Self-pay

## 2019-07-15 DIAGNOSIS — E11610 Type 2 diabetes mellitus with diabetic neuropathic arthropathy: Secondary | ICD-10-CM

## 2019-07-15 DIAGNOSIS — M79672 Pain in left foot: Secondary | ICD-10-CM

## 2019-07-15 DIAGNOSIS — I739 Peripheral vascular disease, unspecified: Secondary | ICD-10-CM

## 2019-07-15 DIAGNOSIS — M21962 Unspecified acquired deformity of left lower leg: Secondary | ICD-10-CM

## 2019-07-15 DIAGNOSIS — L97521 Non-pressure chronic ulcer of other part of left foot limited to breakdown of skin: Secondary | ICD-10-CM | POA: Diagnosis not present

## 2019-07-15 DIAGNOSIS — Z89422 Acquired absence of other left toe(s): Secondary | ICD-10-CM

## 2019-07-15 NOTE — Patient Instructions (Signed)
Family Doctor  [facility] Address: [address], [place], [place] [address] Phone: [phone]  Or   [facility] Nicoletta Dress, MD [facility] - Dr. Nelda Bucks [address] [place], [place] [address] Phone: [phone] Fax: [phone]

## 2019-07-15 NOTE — Progress Notes (Signed)
Subjective: Danny Carpenter is a 69 y.o. male patient seen in office for follow-up evaluation left foot ulcer.  Patient states that [facility] not [facility] he does not like the care that he is receiving there.  Patient states that he wants to come to see me for his wound care and his other doctor for the circulation in [place].  Patient reports that he does not have a family doctor that he likes and goes to on a regular basis and is looking for recommendations of a family doctor to help him.  Reports that he has been consistent wearing surgical shoe and feels like he is doing okay with the shoe much better than the boot because the boot rubbed his leg and he could not wear that.  Patient denies nausea vomiting fever chills or any constitutional symptoms at this time and reports that his blood sugar was 300.  Patient is assisted by son this visit as well  No other pedal complaints noted.  Patient Active Problem List   Diagnosis Date Noted  . AAA (abdominal aortic aneurysm) without rupture ([place]) 06/11/2019  . Hyperlipidemia 06/11/2019  . PICC (peripherally inserted central catheter) in place 02/16/2019  . Encounter for medication management 02/16/2019  . PAD (peripheral artery disease) ([place]) 01/27/2019  . Type 2 diabetes mellitus with other specified complication ([place])   . Subacute osteomyelitis of left foot ([place])   . PVD (peripheral vascular disease) ([place])   . CVA (cerebral vascular accident) ([place]) 01/17/2019  . Diabetic foot infection ([place]) 01/17/2019  . Weight loss 03/19/2015  . Chest wall pain 02/06/2015  . Chronic fatigue 01/09/2015  . Eye pain 09/23/2014  . Pain medication agreement completed 02/06/2014  . Chronic pain 04/05/2013  . Senile nuclear sclerosis 12/06/2011  . Tear film insufficiency 12/06/2011  . Depression 01/07/2005  . DDD (degenerative disc disease) 10/08/2004  . Proteinuria 10/21/2002   Current Outpatient Medications on File Prior to Visit   Medication Sig Dispense Refill  . amLODipine (NORVASC) 10 MG tablet Take 1 tablet (10 mg total) by mouth daily. 30 tablet 0  . carvedilol (COREG CR) 40 MG 24 hr capsule Take 40 mg by mouth at bedtime.    . clopidogrel (PLAVIX) 75 MG tablet Take 75 mg by mouth daily.    . diflunisal (DOLOBID) 500 MG TABS tablet Take 500 mg by mouth 2 (two) times daily as needed (for pain management- take with food or milk).    . DULoxetine (CYMBALTA) 60 MG capsule Take 60 mg by mouth daily.    . fenofibrate 160 MG tablet Take 160 mg by mouth daily.    . fexofenadine (ALLEGRA) 180 MG tablet Take 180 mg by mouth daily as needed for allergies or rhinitis.    . Gabapentin, Once-Daily, (GRALISE) 600 MG TABS Take 1,200 mg by mouth at bedtime.    . hydroxypropyl methylcellulose / hypromellose (ISOPTO TEARS / GONIOVISC) 2.5 % ophthalmic solution Place 1 drop into both eyes daily as needed for dry eyes.    . insulin aspart (NOVOLOG FLEXPEN) 100 UNIT/ML FlexPen Inject 6-8 Units into the skin See admin instructions. Inject 6-8 units into the skin two times a day (morning and early afternoon) as needed, per sliding scale: BGL 300-350 = 6 units; 351-400 = 8 units; >400 = [facility]    . Insulin Degludec-Liraglutide (XULTOPHY) 100-3.6 UNIT-MG/ML SOPN Inject 22 Units into the skin daily before breakfast.     . levETIRAcetam (KEPPRA) 500 MG tablet  Take 1 tablet (500 mg total) by mouth 2 (two) times daily. 60 tablet 0  . linezolid (ZYVOX) 600 MG tablet Take 600 mg by mouth 2 (two) times daily.    Marland Kitchen lisinopril (ZESTRIL) 40 MG tablet Take 40 mg by mouth daily.    Marland Kitchen morphine (MS CONTIN) 15 MG 12 hr tablet Take 15 mg by mouth every 12 (twelve) hours.    . pantoprazole (PROTONIX) 40 MG tablet Take 40 mg by mouth daily before breakfast.    . Probiotic Product (ALIGN) 4 MG CAPS Take 4 mg by mouth daily.    . riboflavin (VITAMIN B-2) 100 MG TABS tablet Take 100 mg by mouth daily.    . rosuvastatin (CRESTOR) 20 MG tablet Take 20 mg by  mouth daily.    . sodium chloride 1 g tablet Take 2 g by mouth See admin instructions. Take 2 grams by mouth in the morning and 2 grams at bedtime    . spironolactone (ALDACTONE) 25 MG tablet Take 25 mg by mouth daily.    Marland Kitchen sulfamethoxazole-trimethoprim (BACTRIM) 400-80 MG tablet Take 1 tablet by mouth 2 (two) times daily. 28 tablet 0  . tamsulosin (FLOMAX) 0.4 MG CAPS capsule Take 0.4 mg by mouth at bedtime.    . traZODone (DESYREL) 100 MG tablet Take 100 mg by mouth at bedtime.     No current facility-administered medications on file prior to visit.   No Known Allergies  Recent Results (from the past 2160 hour(s))  I-STAT, chem 8     Status: Abnormal   Collection Time: 06/17/19  5:53 AM  Result Value Ref Range   Sodium 131 (L) 135 - 145 mmol/L   Potassium 4.2 3.5 - 5.1 mmol/L   Chloride 95 (L) 98 - 111 mmol/L   BUN 24 (H) 8 - 23 mg/dL   Creatinine, Ser 0.90 0.61 - 1.24 mg/dL   Glucose, Bld 259 (H) 70 - 99 mg/dL    Comment: Glucose reference range applies only to samples taken after fasting for at least 8 hours.   Calcium, Ion 1.09 (L) 1.15 - 1.40 mmol/L   TCO2 27 22 - 32 mmol/L   Hemoglobin 12.6 (L) 13.0 - 17.0 g/dL   HCT 37.0 (L) 39.0 - 52.0 %  POCT Activated clotting time     Status: None   Collection Time: 06/17/19  8:46 AM  Result Value Ref Range   Activated Clotting Time 197 seconds  Glucose, capillary     Status: Abnormal   Collection Time: 06/17/19  9:29 AM  Result Value Ref Range   Glucose-Capillary 250 (H) 70 - 99 mg/dL    Comment: Glucose reference range applies only to samples taken after fasting for at least 8 hours.   Comment 1 Document in Chart   POCT Activated clotting time     Status: None   Collection Time: 06/17/19  9:31 AM  Result Value Ref Range   Activated Clotting Time 164 seconds    Objective: There were no vitals filed for this visit.  General: Patient is awake, alert, oriented x 3 and in no acute distress.  Dermatology: Skin is warm and dry  bilateral with a partial thickness ulceration present medial great toe on the left. Ulceration measures 2.1 cm x 2 cm x 0.2cm, post debridement. There is a mildly macerated border with a fibrogranular base. The ulceration does not probe to bone. There is no malodor, no active drainage, blanchable erythema, focal edema.  Previous fifth amputation site healed  on left but there is at the distal incision a small pinpoint ulceration that measures 0.1 x 0.1 x 0.1 cm with a granular base does not probe to fourth toe, no acute signs of infection.   Vascular: Dorsalis Pedis pulse = 0/4 Bilateral,  Posterior Tibial pulse = 0/4 Bilateral,  Capillary Fill Time < 5 seconds  Neurologic: Protective sensation severely diminished on the left.  Musculosketal: There is significant left foot deformity patient has a Z foot type with a very large bunion and crossover deformity of the hallux onto the lesser toes with protrusion of the fifth metatarsal base at previous amputation stump site on left.  No results for input(s): GRAMSTAIN, LABORGA in the last 8760 hours.  Assessment and Plan:  Problem List Items Addressed This Visit      Cardiovascular and Mediastinum   PAD (peripheral artery disease) ([place])    Other Visit Diagnoses    Ulcer of left foot, limited to breakdown of skin ([place])    -  Primary   Charcot foot due to diabetes mellitus ([place])       History of amputation of lesser toe of left foot ([facility])       Foot deformity, left       Left foot pain          -Examined patient and discussed the progression of the wound and treatment alternatives. -Discussed plan of care [facility] patient is very adamant about not wanting to return and wants me to continue with his wound care -Mechanically debrided using a tissue nipper nonviable tissue from the first MPJ and lateral foot ulcerations to healthy bleeding granular tissue.  Patient tolerated debridement procedure with out need for anesthesia.   Hemostasis was achieved with manual pressure. -Applied dry dressing and recommend patient to continue with Santyl daily at home with assistance from his daughter -Ordered wound care dressing supplies from [place] patient that he must continue with staying off his foot as much as possible and wearing a surgical shoe to prevent worsening of Charcot fractures and foot deformity -Continue with vascular follow-up and continue to encourage medical management in order to assist with wound healing; advised patient to get a family doctor that will help him keep better control of his blood sugars - Advised patient to return to office in 2 weeks for follow-up wound care.  Landis Martins, DPM

## 2019-07-19 ENCOUNTER — Encounter: Payer: No Typology Code available for payment source | Admitting: Surgery

## 2019-07-20 ENCOUNTER — Encounter: Payer: No Typology Code available for payment source | Admitting: Vascular Surgery

## 2019-07-30 ENCOUNTER — Ambulatory Visit (INDEPENDENT_AMBULATORY_CARE_PROVIDER_SITE_OTHER): Payer: No Typology Code available for payment source | Admitting: Sports Medicine

## 2019-07-30 ENCOUNTER — Other Ambulatory Visit: Payer: Self-pay

## 2019-07-30 ENCOUNTER — Telehealth: Payer: Self-pay | Admitting: *Deleted

## 2019-07-30 ENCOUNTER — Encounter: Payer: Self-pay | Admitting: Sports Medicine

## 2019-07-30 DIAGNOSIS — Z89422 Acquired absence of other left toe(s): Secondary | ICD-10-CM

## 2019-07-30 DIAGNOSIS — L03032 Cellulitis of left toe: Secondary | ICD-10-CM

## 2019-07-30 DIAGNOSIS — E11610 Type 2 diabetes mellitus with diabetic neuropathic arthropathy: Secondary | ICD-10-CM

## 2019-07-30 DIAGNOSIS — I739 Peripheral vascular disease, unspecified: Secondary | ICD-10-CM

## 2019-07-30 DIAGNOSIS — L97522 Non-pressure chronic ulcer of other part of left foot with fat layer exposed: Secondary | ICD-10-CM

## 2019-07-30 DIAGNOSIS — L02612 Cutaneous abscess of left foot: Secondary | ICD-10-CM

## 2019-07-30 DIAGNOSIS — M21962 Unspecified acquired deformity of left lower leg: Secondary | ICD-10-CM

## 2019-07-30 DIAGNOSIS — M79672 Pain in left foot: Secondary | ICD-10-CM

## 2019-07-30 MED ORDER — SULFAMETHOXAZOLE-TRIMETHOPRIM 800-160 MG PO TABS: 1.0000 | ORAL_TABLET | Freq: Two times a day (BID) | ORAL | 0 refills | Status: DC

## 2019-07-30 NOTE — Telephone Encounter (Signed)
-----   Message from Landis Martins, [place] sent at 07/30/2019 12:40 PM EDT ----- Regarding: Follow-up with vascular Please contact Dr. Stephens Shire office to see if we can get patient rescheduled he urgently needs to follow-up with vascular he has a left foot ulceration that is not healing with now joint capsule exposed; last appointment on 4/19 he was a no-show

## 2019-07-30 NOTE — Progress Notes (Signed)
Subjective: Danny Carpenter is a 69 y.o. male patient seen in office for follow-up evaluation left foot ulcer.  Patient states that since using Santyl start to look gooey and more wet and painful with redness and swelling and drainage.  Reports he has been also using peroxide and soaking in warm water.  His daughter does the dressing changes daily.  Patient reports his blood sugar was 300.  Patient is assisted by son this visit as well  No other pedal complaints noted.  Patient Active Problem List   Diagnosis Date Noted  . AAA (abdominal aortic aneurysm) without rupture ([place]) 06/11/2019  . Hyperlipidemia 06/11/2019  . PICC (peripherally inserted central catheter) in place 02/16/2019  . Encounter for medication management 02/16/2019  . PAD (peripheral artery disease) ([place]) 01/27/2019  . Type 2 diabetes mellitus with other specified complication ([place])   . Subacute osteomyelitis of left foot ([place])   . PVD (peripheral vascular disease) ([place])   . CVA (cerebral vascular accident) ([place]) 01/17/2019  . Diabetic foot infection ([place]) 01/17/2019  . Weight loss 03/19/2015  . Chest wall pain 02/06/2015  . Chronic fatigue 01/09/2015  . Eye pain 09/23/2014  . Pain medication agreement completed 02/06/2014  . Chronic pain 04/05/2013  . Senile nuclear sclerosis 12/06/2011  . Tear film insufficiency 12/06/2011  . Depression 01/07/2005  . DDD (degenerative disc disease) 10/08/2004  . Proteinuria 10/21/2002   Current Outpatient Medications on File Prior to Visit  Medication Sig Dispense Refill  . amLODipine (NORVASC) 10 MG tablet Take 1 tablet (10 mg total) by mouth daily. 30 tablet 0  . carvedilol (COREG CR) 40 MG 24 hr capsule Take 40 mg by mouth at bedtime.    . clopidogrel (PLAVIX) 75 MG tablet Take 75 mg by mouth daily.    . diflunisal (DOLOBID) 500 MG TABS tablet Take 500 mg by mouth 2 (two) times daily as needed (for pain management- take with food or milk).    . DULoxetine (CYMBALTA) 60 MG  capsule Take 60 mg by mouth daily.    . fenofibrate 160 MG tablet Take 160 mg by mouth daily.    . fexofenadine (ALLEGRA) 180 MG tablet Take 180 mg by mouth daily as needed for allergies or rhinitis.    . Gabapentin, Once-Daily, (GRALISE) 600 MG TABS Take 1,200 mg by mouth at bedtime.    . hydroxypropyl methylcellulose / hypromellose (ISOPTO TEARS / GONIOVISC) 2.5 % ophthalmic solution Place 1 drop into both eyes daily as needed for dry eyes.    . insulin aspart (NOVOLOG FLEXPEN) 100 UNIT/ML FlexPen Inject 6-8 Units into the skin See admin instructions. Inject 6-8 units into the skin two times a day (morning and early afternoon) as needed, per sliding scale: BGL 300-350 = 6 units; 351-400 = 8 units; >400 = CALL STAYWELL    . Insulin Degludec-Liraglutide (XULTOPHY) 100-3.6 UNIT-MG/ML SOPN Inject 22 Units into the skin daily before breakfast.     . levETIRAcetam (KEPPRA) 500 MG tablet Take 1 tablet (500 mg total) by mouth 2 (two) times daily. 60 tablet 0  . linezolid (ZYVOX) 600 MG tablet Take 600 mg by mouth 2 (two) times daily.    Marland Kitchen lisinopril (ZESTRIL) 40 MG tablet Take 40 mg by mouth daily.    Marland Kitchen morphine (MS CONTIN) 15 MG 12 hr tablet Take 15 mg by mouth every 12 (twelve) hours.    . pantoprazole (PROTONIX) 40 MG tablet Take 40 mg by mouth daily before breakfast.    . Probiotic Product (  ALIGN) 4 MG CAPS Take 4 mg by mouth daily.    . riboflavin (VITAMIN B-2) 100 MG TABS tablet Take 100 mg by mouth daily.    . rosuvastatin (CRESTOR) 20 MG tablet Take 20 mg by mouth daily.    . sodium chloride 1 g tablet Take 2 g by mouth See admin instructions. Take 2 grams by mouth in the morning and 2 grams at bedtime    . spironolactone (ALDACTONE) 25 MG tablet Take 25 mg by mouth daily.    . tamsulosin (FLOMAX) 0.4 MG CAPS capsule Take 0.4 mg by mouth at bedtime.    . traZODone (DESYREL) 100 MG tablet Take 100 mg by mouth at bedtime.     No current facility-administered medications on file prior to visit.    No Known Allergies  Recent Results (from the past 2160 hour(s))  I-STAT, chem 8     Status: Abnormal   Collection Time: 06/17/19  5:53 AM  Result Value Ref Range   Sodium 131 (L) 135 - 145 mmol/L   Potassium 4.2 3.5 - 5.1 mmol/L   Chloride 95 (L) 98 - 111 mmol/L   BUN 24 (H) 8 - 23 mg/dL   Creatinine, Ser 0.90 0.61 - 1.24 mg/dL   Glucose, Bld 259 (H) 70 - 99 mg/dL    Comment: Glucose reference range applies only to samples taken after fasting for at least 8 hours.   Calcium, Ion 1.09 (L) 1.15 - 1.40 mmol/L   TCO2 27 22 - 32 mmol/L   Hemoglobin 12.6 (L) 13.0 - 17.0 g/dL   HCT 37.0 (L) 39.0 - 52.0 %  POCT Activated clotting time     Status: None   Collection Time: 06/17/19  8:46 AM  Result Value Ref Range   Activated Clotting Time 197 seconds  Glucose, capillary     Status: Abnormal   Collection Time: 06/17/19  9:29 AM  Result Value Ref Range   Glucose-Capillary 250 (H) 70 - 99 mg/dL    Comment: Glucose reference range applies only to samples taken after fasting for at least 8 hours.   Comment 1 Document in Chart   POCT Activated clotting time     Status: None   Collection Time: 06/17/19  9:31 AM  Result Value Ref Range   Activated Clotting Time 164 seconds    Objective: There were no vitals filed for this visit.  General: Patient is awake, alert, oriented x 3 and in no acute distress.  Dermatology: Skin is warm and dry bilateral with a now full thickness ulceration present medial great toe on the left. Ulceration measures 3 cm x 2.5 cm x 0.4cm, post debridement. There is a moderate macerated border with a fibrogranular base and exposed capsule probes to bone there is no malodor but clear active drainage and significant blanchable erythema now with increased warmth and edema.  Previous pinpoint ulcerations at the previous amputation stump site has healed and have dry scabs to the area.  Vascular: Dorsalis Pedis pulse = 0/4 Bilateral,  Posterior Tibial pulse = 0/4 Bilateral,   Capillary Fill Time < 5 seconds  Neurologic: Protective sensation severely diminished on the left.  Musculosketal: There is significant left foot deformity patient has a Z foot type with a very large bunion and crossover deformity of the hallux onto the lesser toes with protrusion of the fifth metatarsal base at previous amputation stump site on left as previously noted.  No results for input(s): GRAMSTAIN, LABORGA in the last 8760 hours.  Assessment and Plan:  Problem List Items Addressed This Visit      Cardiovascular and Mediastinum   PAD (peripheral artery disease) ([place])    Other Visit Diagnoses    Ulcer of left foot with fat layer exposed ([place])    -  Primary   Charcot foot due to diabetes mellitus ([place])       History of amputation of lesser toe of left foot (H[facility])       Foot deformity, left       Left foot pain       Cellulitis and abscess of toe of left foot           -Examined patient and Re-discussed the progression of the wound and treatment alternatives. -Mechanically debrided using a tissue nipper nonviable tissue from the first MPJ to healthy bleeding granular tissue.  Patient tolerated debridement procedure with out need for anesthesia.  Hemostasis was achieved with manual pressure. -Applied Prisma and Betadine dry dressing and recommend patient to continue with same daily at home with assistance from his daughter.  Advised to discontinue Santyl. -Advised patient to follow-up with PCP and also vascular doctor -Continue with postoperative shoe - Advised patient to return to office in 1 week for follow-up wound care.  Landis Martins, DPM

## 2019-07-30 NOTE — Telephone Encounter (Signed)
[facility]VS - Sonya transferred to [facility]. I left a message informing Olene Floss of Dr. Leeanne Rio 07/30/2019 12:40pm orders, and requested call back if more information was needed.

## 2019-08-02 ENCOUNTER — Telehealth: Payer: Self-pay | Admitting: *Deleted

## 2019-08-02 NOTE — Telephone Encounter (Signed)
Entered in error

## 2019-08-04 ENCOUNTER — Ambulatory Visit (INDEPENDENT_AMBULATORY_CARE_PROVIDER_SITE_OTHER): Payer: Medicare Other | Admitting: Sports Medicine

## 2019-08-04 ENCOUNTER — Encounter: Payer: Self-pay | Admitting: Sports Medicine

## 2019-08-04 ENCOUNTER — Encounter (HOSPITAL_COMMUNITY): Payer: Self-pay | Admitting: Emergency Medicine

## 2019-08-04 ENCOUNTER — Other Ambulatory Visit: Payer: Self-pay

## 2019-08-04 ENCOUNTER — Inpatient Hospital Stay (HOSPITAL_COMMUNITY)
Admission: EM | Admit: 2019-08-04 | Discharge: 2019-08-08 | DRG: 617 | Disposition: A | Discharge status: Home Health | Payer: Medicare Other | Attending: Internal Medicine | Admitting: Internal Medicine

## 2019-08-04 ENCOUNTER — Ambulatory Visit (INDEPENDENT_AMBULATORY_CARE_PROVIDER_SITE_OTHER): Payer: Medicare Other

## 2019-08-04 DIAGNOSIS — Z8673 Personal history of transient ischemic attack (TIA), and cerebral infarction without residual deficits: Secondary | ICD-10-CM

## 2019-08-04 DIAGNOSIS — N179 Acute kidney failure, unspecified: Secondary | ICD-10-CM | POA: Diagnosis present

## 2019-08-04 DIAGNOSIS — Z79891 Long term (current) use of opiate analgesic: Secondary | ICD-10-CM

## 2019-08-04 DIAGNOSIS — L089 Local infection of the skin and subcutaneous tissue, unspecified: Secondary | ICD-10-CM | POA: Diagnosis not present

## 2019-08-04 DIAGNOSIS — I739 Peripheral vascular disease, unspecified: Secondary | ICD-10-CM

## 2019-08-04 DIAGNOSIS — E11628 Type 2 diabetes mellitus with other skin complications: Secondary | ICD-10-CM

## 2019-08-04 DIAGNOSIS — L97524 Non-pressure chronic ulcer of other part of left foot with necrosis of bone: Secondary | ICD-10-CM

## 2019-08-04 DIAGNOSIS — L97529 Non-pressure chronic ulcer of other part of left foot with unspecified severity: Secondary | ICD-10-CM | POA: Diagnosis present

## 2019-08-04 DIAGNOSIS — M86172 Other acute osteomyelitis, left ankle and foot: Secondary | ICD-10-CM

## 2019-08-04 DIAGNOSIS — L03032 Cellulitis of left toe: Secondary | ICD-10-CM | POA: Diagnosis not present

## 2019-08-04 DIAGNOSIS — E11610 Type 2 diabetes mellitus with diabetic neuropathic arthropathy: Secondary | ICD-10-CM

## 2019-08-04 DIAGNOSIS — I252 Old myocardial infarction: Secondary | ICD-10-CM

## 2019-08-04 DIAGNOSIS — J449 Chronic obstructive pulmonary disease, unspecified: Secondary | ICD-10-CM | POA: Diagnosis present

## 2019-08-04 DIAGNOSIS — L03119 Cellulitis of unspecified part of limb: Secondary | ICD-10-CM

## 2019-08-04 DIAGNOSIS — I714 Abdominal aortic aneurysm, without rupture, unspecified: Secondary | ICD-10-CM | POA: Diagnosis present

## 2019-08-04 DIAGNOSIS — E1169 Type 2 diabetes mellitus with other specified complication: Secondary | ICD-10-CM | POA: Diagnosis not present

## 2019-08-04 DIAGNOSIS — Z89432 Acquired absence of left foot: Secondary | ICD-10-CM

## 2019-08-04 DIAGNOSIS — Z79899 Other long term (current) drug therapy: Secondary | ICD-10-CM

## 2019-08-04 DIAGNOSIS — Z7902 Long term (current) use of antithrombotics/antiplatelets: Secondary | ICD-10-CM

## 2019-08-04 DIAGNOSIS — Z20822 Contact with and (suspected) exposure to covid-19: Secondary | ICD-10-CM | POA: Diagnosis present

## 2019-08-04 DIAGNOSIS — E1151 Type 2 diabetes mellitus with diabetic peripheral angiopathy without gangrene: Secondary | ICD-10-CM | POA: Diagnosis present

## 2019-08-04 DIAGNOSIS — R5382 Chronic fatigue, unspecified: Secondary | ICD-10-CM | POA: Diagnosis present

## 2019-08-04 DIAGNOSIS — K219 Gastro-esophageal reflux disease without esophagitis: Secondary | ICD-10-CM | POA: Diagnosis present

## 2019-08-04 DIAGNOSIS — E1165 Type 2 diabetes mellitus with hyperglycemia: Secondary | ICD-10-CM | POA: Diagnosis present

## 2019-08-04 DIAGNOSIS — L02612 Cutaneous abscess of left foot: Secondary | ICD-10-CM

## 2019-08-04 DIAGNOSIS — Z89422 Acquired absence of other left toe(s): Secondary | ICD-10-CM

## 2019-08-04 DIAGNOSIS — Z794 Long term (current) use of insulin: Secondary | ICD-10-CM

## 2019-08-04 DIAGNOSIS — M869 Osteomyelitis, unspecified: Secondary | ICD-10-CM | POA: Diagnosis not present

## 2019-08-04 DIAGNOSIS — I110 Hypertensive heart disease with heart failure: Secondary | ICD-10-CM | POA: Diagnosis present

## 2019-08-04 DIAGNOSIS — Z87891 Personal history of nicotine dependence: Secondary | ICD-10-CM

## 2019-08-04 DIAGNOSIS — I5032 Chronic diastolic (congestive) heart failure: Secondary | ICD-10-CM | POA: Diagnosis present

## 2019-08-04 DIAGNOSIS — N400 Enlarged prostate without lower urinary tract symptoms: Secondary | ICD-10-CM | POA: Diagnosis present

## 2019-08-04 DIAGNOSIS — G8929 Other chronic pain: Secondary | ICD-10-CM | POA: Diagnosis present

## 2019-08-04 DIAGNOSIS — E11621 Type 2 diabetes mellitus with foot ulcer: Secondary | ICD-10-CM | POA: Diagnosis present

## 2019-08-04 DIAGNOSIS — E871 Hypo-osmolality and hyponatremia: Secondary | ICD-10-CM | POA: Diagnosis present

## 2019-08-04 DIAGNOSIS — M86272 Subacute osteomyelitis, left ankle and foot: Secondary | ICD-10-CM | POA: Diagnosis present

## 2019-08-04 DIAGNOSIS — E785 Hyperlipidemia, unspecified: Secondary | ICD-10-CM | POA: Diagnosis present

## 2019-08-04 DIAGNOSIS — E7800 Pure hypercholesterolemia, unspecified: Secondary | ICD-10-CM | POA: Diagnosis present

## 2019-08-04 LAB — CBC WITH DIFFERENTIAL/PLATELET
Abs Immature Granulocytes: 0.08 10*3/uL — ABNORMAL HIGH (ref 0.00–0.07)
Basophils Absolute: 0.1 10*3/uL (ref 0.0–0.1)
Basophils Relative: 1 %
Eosinophils Absolute: 0.2 10*3/uL (ref 0.0–0.5)
Eosinophils Relative: 3 %
HCT: 34.5 % — ABNORMAL LOW (ref 39.0–52.0)
Hemoglobin: 11.2 g/dL — ABNORMAL LOW (ref 13.0–17.0)
Immature Granulocytes: 1 %
Lymphocytes Relative: 13 %
Lymphs Abs: 1.1 10*3/uL (ref 0.7–4.0)
MCH: 28.8 pg (ref 26.0–34.0)
MCHC: 32.5 g/dL (ref 30.0–36.0)
MCV: 88.7 fL (ref 80.0–100.0)
Monocytes Absolute: 0.8 10*3/uL (ref 0.1–1.0)
Monocytes Relative: 9 %
Neutro Abs: 6.4 10*3/uL (ref 1.7–7.7)
Neutrophils Relative %: 73 %
Platelets: 329 10*3/uL (ref 150–400)
RBC: 3.89 MIL/uL — ABNORMAL LOW (ref 4.22–5.81)
RDW: 12.6 % (ref 11.5–15.5)
WBC: 8.7 10*3/uL (ref 4.0–10.5)
nRBC: 0 % (ref 0.0–0.2)

## 2019-08-04 LAB — COMPREHENSIVE METABOLIC PANEL
ALT: 66 U/L — ABNORMAL HIGH (ref 0–44)
AST: 66 U/L — ABNORMAL HIGH (ref 15–41)
Albumin: 2.5 g/dL — ABNORMAL LOW (ref 3.5–5.0)
Alkaline Phosphatase: 93 U/L (ref 38–126)
Anion gap: 11 (ref 5–15)
BUN: 21 mg/dL (ref 8–23)
CO2: 22 mmol/L (ref 22–32)
Calcium: 8.8 mg/dL — ABNORMAL LOW (ref 8.9–10.3)
Chloride: 92 mmol/L — ABNORMAL LOW (ref 98–111)
Creatinine, Ser: 1.28 mg/dL — ABNORMAL HIGH (ref 0.61–1.24)
GFR calc Af Amer: >60 mL/min (ref >60)
GFR calc non Af Amer: 58 mL/min — ABNORMAL LOW (ref >60)
Glucose, Bld: 390 mg/dL — ABNORMAL HIGH (ref 70–99)
Potassium: 4.8 mmol/L (ref 3.5–5.1)
Sodium: 125 mmol/L — ABNORMAL LOW (ref 135–145)
Total Bilirubin: 0.5 mg/dL (ref 0.3–1.2)
Total Protein: 7.2 g/dL (ref 6.5–8.1)

## 2019-08-04 LAB — LACTIC ACID, PLASMA
Lactic Acid, Venous: 1.1 mmol/L (ref 0.5–1.9)
Lactic Acid, Venous: 1.3 mmol/L (ref 0.5–1.9)

## 2019-08-04 LAB — RESPIRATORY PANEL BY RT PCR (FLU A&B, COVID)
Influenza A by PCR: NEGATIVE
Influenza B by PCR: NEGATIVE
SARS Coronavirus 2 by RT PCR: NEGATIVE

## 2019-08-04 MED ORDER — ACETAMINOPHEN 325 MG PO TABS: 650.0000 mg | ORAL_TABLET | Freq: Once | ORAL | Status: DC

## 2019-08-04 MED ORDER — VANCOMYCIN HCL 2000 MG/400ML IV SOLN
2000.0000 mg | INTRAVENOUS | Status: DC
  Administered 2019-08-05: 2000 mg via INTRAVENOUS
  Filled 2019-08-04 (×2): qty 400

## 2019-08-04 MED ORDER — SODIUM CHLORIDE 0.9 % IV SOLN
2.0000 g | Freq: Once | INTRAVENOUS | Status: AC
  Administered 2019-08-04: 2 g via INTRAVENOUS
  Filled 2019-08-04: qty 20

## 2019-08-04 MED ORDER — SODIUM CHLORIDE 0.9 % IV SOLN: Freq: Once | INTRAVENOUS | Status: AC

## 2019-08-04 NOTE — Progress Notes (Signed)
Pharmacy Antibiotic Note  Danny Carpenter is a 69 y.o. male admitted on 08/04/2019 with Osteo.  Pharmacy has been consulted for Vancomcyin dosing.   Height: 6\' 1"  (185.4 cm) Weight: 97.5 kg (215 lb) IBW/kg (Calculated) : 79.9  Temp (24hrs), Avg:98.1 F (36.7 C), Min:98.1 F (36.7 C), Max:98.1 F (36.7 C)  Recent Labs  Lab 08/04/19 1829  WBC 8.7  CREATININE 1.28*  LATICACIDVEN 1.1    Estimated Creatinine Clearance: 68.8 mL/min (A) (by C-G formula based on SCr of 1.28 mg/dL (H)).    No Known Allergies  Antimicrobials this admission: 5/5 Ceftriaxone >>  5/5 Vancomycin >>   Dose adjustments this admission: N/a  Microbiology results: Pending   Plan:   - Start Vancomycin 2000mg  IV q24h - Est Calc AUC 500 - Monitor patients renal function and urine output  - De-escalate ABX when appropriate   Thank you for allowing pharmacy to be a part of this patient's care.  Duanne Limerick PharmD. BCPS 08/04/2019 9:15 PM

## 2019-08-04 NOTE — ED Provider Notes (Signed)
[facility] EMERGENCY DEPARTMENT Provider Note   CSN: [id number] Arrival date & time: 08/04/19  1751     History Chief Complaint  Patient presents with  . Foot Pain    Diabetic    Danny Carpenter is a 69 y.o. male history CHF COPD CVA diabetes GERD hypertension hypercholesterolemia sleep apnea AAA PVD.  Patient presents today for infection of the left foot.  He reports he has had ongoing infection of the left foot for greater than 1 year, has been treated by podiatry however symptoms have been increasingly worsening.  He describes mild intensity throbbing pain of the left medial foot worse with palpation improved with rest, nonradiating.  He reports he has had increasing amount of drainage and swelling to the foot.  Patient reports that he is here for left foot amputation.  Denies fever/chills, chest pain, shortness of breath, abdominal pain, nausea/vomiting, cough, sick contacts, numbness or weakness or any additional concerns.  HPI     Past Medical History:  Diagnosis Date  . AAA (abdominal aortic aneurysm) ([place])   . Anxiety   . Arthritis   . CHF (congestive heart failure) ([place])   . COPD (chronic obstructive pulmonary disease) ([place])   . CVA (cerebral vascular accident) ([place])   . Depression   . DM2 (diabetes mellitus, type 2) ([place])   . Fracture of rib    Left  . GERD (gastroesophageal reflux disease)   . HTN (hypertension)   . Hypercholesteremia   . Myocardial infarction ([place])    "light" heart attack 15 years ago (per pt 01/26/19)  . Peripheral vascular disease ([place])   . PONV (postoperative nausea and vomiting)   . Seizure [facility])    one time in Oct. 2020  . Sleep apnea    no cpap    Patient Active Problem List   Diagnosis Date Noted  . AAA (abdominal aortic aneurysm) without rupture ([place]) 06/11/2019  . Hyperlipidemia 06/11/2019  . PICC (peripherally inserted central catheter) in place 02/16/2019  . Encounter for medication management 02/16/2019   . PAD (peripheral artery disease) ([place]) 01/27/2019  . Type 2 diabetes mellitus with other specified complication ([place])   . Subacute osteomyelitis of left foot ([place])   . PVD (peripheral vascular disease) ([place])   . CVA (cerebral vascular accident) ([place]) 01/17/2019  . Diabetic foot infection ([place]) 01/17/2019  . Weight loss 03/19/2015  . Chest wall pain 02/06/2015  . Chronic fatigue 01/09/2015  . Eye pain 09/23/2014  . Pain medication agreement completed 02/06/2014  . Chronic pain 04/05/2013  . Senile nuclear sclerosis 12/06/2011  . Tear film insufficiency 12/06/2011  . Depression 01/07/2005  . DDD (degenerative disc disease) 10/08/2004  . Proteinuria 10/21/2002    Past Surgical History:  Procedure Laterality Date  . ABDOMINAL AORTOGRAM W/LOWER EXTREMITY N/A 01/19/2019   Procedure: ABDOMINAL AORTOGRAM W/LOWER EXTREMITY;  Surgeon: Serafina Mitchell, MD;  Location: [facility] CV LAB;  Service: Cardiovascular;  Laterality: N/A;  . ABDOMINAL AORTOGRAM W/LOWER EXTREMITY Left 06/17/2019   Procedure: ABDOMINAL AORTOGRAM W/LOWER EXTREMITY;  Surgeon: Marty Heck, MD;  Location: [facility] CV LAB;  Service: Cardiovascular;  Laterality: Left;  . AMPUTATION Left 02/22/2019   Procedure: LEFT FOOT 5TH RAY AMPUTATION;  Surgeon: Newt Minion, MD;  Location: [facility];  Service: Orthopedics;  Laterality: Left;  . APPENDECTOMY    . ENDARTERECTOMY FEMORAL Left 01/27/2019   Procedure: ENDARTERECTOMY LEFT FEMORAL ARTERY AND PROFUNDA  ARTERY;  Surgeon: Serafina Mitchell, MD;  [facility]  OR;  Service: Vascular;  Laterality: Left;  . FEMORAL-POPLITEAL BYPASS GRAFT Left 01/27/2019   Procedure: BYPASS GRAFT FEMORAL-POPLITEAL ARTERY;  Surgeon: Serafina Mitchell, MD;  Location: [facility];  Service: Vascular;  Laterality: Left;  . FINGER AMPUTATION     Left second and third fingers, work accident  . KNEE SURGERY Left   . LOWER EXTREMITY ANGIOGRAM  05/2008   Left SFA atherectomy with diamondback 2.25 utilizing  distal protection device NAV6 placed in the left popliteal artery. Angioplasty of the left SFA with ATV balloon 6 x 40   . PATCH ANGIOPLASTY Left 01/27/2019   Procedure: Patch Angioplasty using Vein;  Surgeon: Serafina Mitchell, MD;  Location: [facility];  Service: Vascular;  Laterality: Left;  . PERIPHERAL VASCULAR BALLOON ANGIOPLASTY Left 06/17/2019   Procedure: PERIPHERAL VASCULAR BALLOON ANGIOPLASTY;  Surgeon: Marty Heck, MD;  Location: [facility] CV LAB;  Service: Cardiovascular;  Laterality: Left;  left proximal and mid bypass  . SHOULDER SURGERY Right   . VEIN HARVEST Left 01/27/2019   Procedure: Left Saphenous Vein Harvest;  Surgeon: Serafina Mitchell, MD;  Location: [facility];  Service: Vascular;  Laterality: Left;       No family history on file.  Social History   Tobacco Use  . Smoking status: Former Smoker    Packs/day: 1.00    Years: 40.00    Pack years: 40.00    Types: Cigarettes    Quit date: 04/01/2009    Years since quitting: 10.3  . Smokeless tobacco: Never Used  Substance Use Topics  . Alcohol use: Not Currently  . Drug use: Never    Home Medications Prior to Admission medications   Medication Sig Start Date End Date Taking? Authorizing Provider  amLODipine (NORVASC) 10 MG tablet Take 1 tablet (10 mg total) by mouth daily. 01/21/19  Yes Masoudi, Elhamalsadat, MD  pantoprazole (PROTONIX) 40 MG tablet Take 40 mg by mouth daily before breakfast.   Yes [provider]  carvedilol (COREG CR) 40 MG 24 hr capsule Take 40 mg by mouth at bedtime.    [provider]  clopidogrel (PLAVIX) 75 MG tablet Take 75 mg by mouth daily.    [provider]  diflunisal (DOLOBID) 500 MG TABS tablet Take 500 mg by mouth 2 (two) times daily as needed (for pain management- take with food or milk).    [provider]  DULoxetine (CYMBALTA) 60 MG capsule Take 60 mg by mouth daily.    [provider]  fenofibrate 160 MG tablet Take 160 mg by  mouth daily.    [provider]  fexofenadine (ALLEGRA) 180 MG tablet Take 180 mg by mouth daily as needed for allergies or rhinitis.    [provider]  Gabapentin, Once-Daily, (GRALISE) 600 MG TABS Take 1,200 mg by mouth at bedtime.    [provider]  hydroxypropyl methylcellulose / hypromellose (ISOPTO TEARS / GONIOVISC) 2.5 % ophthalmic solution Place 1 drop into both eyes daily as needed for dry eyes.    [provider]  insulin aspart (NOVOLOG FLEXPEN) 100 UNIT/ML FlexPen Inject 6-8 Units into the skin See admin instructions. Inject 6-8 units into the skin two times a day (morning and early afternoon) as needed, per sliding scale: BGL 300-350 = 6 units; 351-400 = 8 units; >400 = CALL STAYWELL    [provider]  Insulin Degludec-Liraglutide (XULTOPHY) 100-3.6 UNIT-MG/ML SOPN Inject 22 Units into the skin daily before breakfast.     [provider]  levETIRAcetam (KEPPRA) 500 MG tablet Take 1 tablet (500 mg total) by mouth 2 (two) times daily. 01/20/19   Masoudi, Elhamalsadat, MD  linezolid (ZYVOX) 600 MG tablet Take 600 mg by mouth 2 (two) times daily.    [provider]  lisinopril (ZESTRIL) 40 MG tablet Take 40 mg by mouth daily.    [provider]  morphine (MS CONTIN) 15 MG 12 hr tablet Take 15 mg by mouth every 12 (twelve) hours.    [provider]  Probiotic Product (ALIGN) 4 MG CAPS Take 4 mg by mouth daily.    [provider]  riboflavin (VITAMIN B-2) 100 MG TABS tablet Take 100 mg by mouth daily.    [provider]  rosuvastatin (CRESTOR) 20 MG tablet Take 20 mg by mouth daily.    [provider]  sodium chloride 1 g tablet Take 2 g by mouth See admin instructions. Take 2 grams by mouth in the morning and 2 grams at bedtime    [provider]  spironolactone (ALDACTONE) 25 MG tablet Take 25 mg by mouth daily.    [provider]  sulfamethoxazole-trimethoprim  (BACTRIM DS) 800-160 MG tablet Take 1 tablet by mouth 2 (two) times daily. 07/30/19   Landis Martins, DPM  tamsulosin (FLOMAX) 0.4 MG CAPS capsule Take 0.4 mg by mouth at bedtime.    [provider]  traZODone (DESYREL) 100 MG tablet Take 100 mg by mouth at bedtime.    [provider]    Allergies    Patient has no known allergies.  Review of Systems   Review of Systems Ten systems are reviewed and are negative for acute change except as noted in the HPI  Physical Exam Updated Vital Signs BP (!) 180/79 (BP Location: Left Arm)   Pulse 66   Temp 98.1 F (36.7 C) (Oral)   Resp 16   Ht 6\' 1"  (1.854 m)   Wt 97.5 kg   SpO2 100%   BMI 28.37 kg/m   Physical Exam Constitutional:      General: He is not in acute distress.    Appearance: Normal appearance. He is well-developed. He is not ill-appearing or diaphoretic.  HENT:     Head: Normocephalic and atraumatic.     Right Ear: External ear normal.     Left Ear: External ear normal.     Nose: Nose normal.  Eyes:     General: Vision grossly intact. Gaze aligned appropriately.     Pupils: Pupils are equal, round, and reactive to light.  Neck:     Trachea: Trachea and phonation normal. No tracheal deviation.  Pulmonary:     Effort: Pulmonary effort is normal. No respiratory distress.  Abdominal:     General: There is no distension.     Palpations: Abdomen is soft.     Tenderness: There is no abdominal tenderness. There is no guarding or rebound.  Musculoskeletal:        General: Normal range of motion.     Cervical back: Normal range of motion.       Feet:  Feet:     Comments: Large purulent wound with surrounding erythema. Skin:    General: Skin is warm and dry.  Neurological:     Mental Status: He is alert.     GCS: GCS eye subscore is 4. GCS verbal subscore is 5. GCS motor subscore is 6.     Comments: Speech is clear and goal oriented, follows commands Major  Cranial nerves without deficit, no facial  droop Moves extremities without ataxia, coordination intact  Psychiatric:        Behavior: Behavior normal.     ED Results / Procedures / Treatments   Labs (all labs ordered are listed, but only abnormal results are displayed) Labs Reviewed  COMPREHENSIVE METABOLIC PANEL - Abnormal; Notable for the following components:      Result Value   Sodium 125 (*)    Chloride 92 (*)    Glucose, Bld 390 (*)    Creatinine, Ser 1.28 (*)    Calcium 8.8 (*)    Albumin 2.5 (*)    AST 66 (*)    ALT 66 (*)    GFR calc non Af Amer 58 (*)    All other components within normal limits  CBC WITH DIFFERENTIAL/PLATELET - Abnormal; Notable for the following components:   RBC 3.89 (*)    Hemoglobin 11.2 (*)    HCT 34.5 (*)    Abs Immature Granulocytes 0.08 (*)    All other components within normal limits  CULTURE, BLOOD (ROUTINE X 2)  CULTURE, BLOOD (ROUTINE X 2)  RESPIRATORY PANEL BY RT PCR (FLU A&B, COVID)  LACTIC ACID, PLASMA  LACTIC ACID, PLASMA    EKG None  Radiology No results found.  Procedures Procedures (including critical care time)  Medications Ordered in ED Medications  0.9 %  sodium chloride infusion (has no administration in time range)  cefTRIAXone (ROCEPHIN) 2 g in sodium chloride 0.9 % 100 mL IVPB (has no administration in time range)  vancomycin (VANCOREADY) IVPB 2000 mg/400 mL (has no administration in time range)    ED Course  I have reviewed the triage vital signs and the nursing notes.  Pertinent labs & imaging results that were available during my care of the patient were reviewed by me and considered in my medical decision making (see chart for details).  Clinical Course as of Aug 03 2229  Wed Aug 04, 2019  2[address] [facility]   [BM]  2208 Dr. Andria Frames   [BM]    Clinical Course User Index [BM] Gari Crown   MDM Rules/Calculators/A&P                     69 year old male with past medical history as detailed above presents today for  infection of the left foot.  Sent in from podiatry today for amputation.  He denies any other concerns today reports he is otherwise feeling well.  No history of fevers nausea vomiting sick contacts or additional concerns.  He has large purulent wound of the left foot.  I have reviewed and interpreted the following triage labs.  CBC shows no leukocytosis, hemoglobin of 11.2 appears baseline.  CMP shows hyponatremia, hyperglycemia, mild AKI and mild elevation of AST and ALT.  Lactic 1.1 is reassuring.  Patient afebrile on arrival no tachycardia, hypotension, tachypnea or hypoxia.  Patient does not appear toxic or septic on initial evaluation.  I have added blood cultures and screening Covid test. -------------------- I have reviewed patient's podiatry note from today, assessment and plan lists foot ulcer left with necrosis of the bone, cellulitis and abscess of the toe of the left foot, acute osteomyelitis of the left ankle or foot, Charcot foot due to diabetes and history of amputation of lesser toe of the left foot.  It appears that applied Betadine and advised patient to go to the ER due to emphysema around the first metatarsophalangeal joint, concern  for septic joint versus osteomyelitis with abscess.  The initially will send patient to [facility] however that transfer was declined as patient would need likely vascular consult here at [facility] instead. ----- I reviewed patient's chart and was able to locate an x-ray of the left foot obtained at 3:08 PM today.  I then compared that x-ray to one obtained on June 11, 2019.  I agree with podiatrist there does appear to be new fracture around the first MTP joint along with what appears to be soft tissue gas.  I called [facility] radiology at 9:18 PM, I asked Suanne Marker to have patient's x-ray moved up in queue for official read from the radiologist. - Due to concern of osteomyelitis versus septic arthritis I ordered IV vancomycin and Rocephin.  Additionally  ordered maintenance sodium chloride infusion.  Consult placed to hospitalist for admission. - 10:08 PM: Discussed case with Dr. Andria Frames who is accepted patient to hospitalist service.   Patient's case discussed with Dr. Stark Jock who agrees with plan.  Note: Portions of this report may have been transcribed using voice recognition software. Every effort was made to ensure accuracy; however, inadvertent computerized transcription errors may still be present. Final Clinical Impression(s) / ED Diagnoses Final diagnoses:  Foot infection    Rx / DC Orders ED Discharge Orders    None       Gari Crown 08/04/19 2231    Veryl Speak, MD 08/05/19 1626

## 2019-08-04 NOTE — ED Triage Notes (Signed)
Patient states he was sent by foot specialist to receive IV antibiotics and get admitted to have left foot amputated on Friday. Reports drainage from it x 3 weeks.

## 2019-08-04 NOTE — H&P (Signed)
History and Physical    Danny Carpenter Danny Carpenter DOB: 1951/02/13 DOA: 08/04/2019  PCP: Danny Overly, MD  Patient coming from: Home  I have personally briefly reviewed patient's old medical records in [facility]  Chief Complaint: L foot ulcer  HPI: Danny Carpenter is a 69 y.o. male with medical history significant of HTN, HLD, T2DM, PAD s/p, left common femoral and profundofemoral endarterectomy with vein patch angioplasty, and L femoral to above knee popliteal artery bypass graft with reverse ipsilateral translocated saphenous vein 12/2018 and s/p L 5th digit amputation presents with worsening non healing ulcer of L foot.  Patient presents after evaluation by podiatrist, Dr. Cannon Carpenter, for left foot ulcer.  Patient has had having more imbalance last week and 1 week prior did have a fall leading him to the ground with minor soft bump to the head.  No ongoing symptoms since that time no worsening gait ataxia.  No focal deficits.  He presents today with worsening ulcer and erythema with increased drainage from the left foot.  He states the bone has been exposed now for probably at least 3 to 5 days.  He has been bearing weight on this wearing appropriate shoe.  He reports ever since amputation of the left  fifth digit he has had difficulty with his gait.  He denies other acute symptoms - no fevers, chills, sweats.  No cough, sob, chest pain.    Review of Systems: As per HPI otherwise 10 point review of systems negative.    Past Medical History:  Diagnosis Date   AAA (abdominal aortic aneurysm) (HCC)    Anxiety    Arthritis    CHF (congestive heart failure) (HCC)    COPD (chronic obstructive pulmonary disease) (HCC)    CVA (cerebral vascular accident) ([place])    Depression    DM2 (diabetes mellitus, type 2) ([place])    Fracture of rib    Left   GERD (gastroesophageal reflux disease)    HTN (hypertension)    Hypercholesteremia    Myocardial infarction ([place])    "light" heart attack 15 years ago (per pt 01/26/19)   Peripheral vascular disease (HCC)    PONV (postoperative nausea and vomiting)    Seizure ([place])    one time in Oct. 2020   Sleep apnea    no cpap    Past Surgical History:  Procedure Laterality Date   ABDOMINAL AORTOGRAM W/LOWER EXTREMITY N/A 01/19/2019   Procedure: ABDOMINAL AORTOGRAM W/LOWER EXTREMITY;  Surgeon: Serafina Mitchell, MD;  Location: [facility];  Service: Cardiovascular;  Laterality: N/A;   ABDOMINAL AORTOGRAM W/LOWER EXTREMITY Left 06/17/2019   Procedure: ABDOMINAL AORTOGRAM W/LOWER EXTREMITY;  Surgeon: Marty Heck, MD;  Location: [facility];  Service: Cardiovascular;  Laterality: Left;   AMPUTATION Left 02/22/2019   Procedure: LEFT FOOT 5TH RAY AMPUTATION;  Surgeon: Newt Minion, MD;  Location: [facility];  Service: Orthopedics;  Laterality: Left;   APPENDECTOMY     ENDARTERECTOMY FEMORAL Left 01/27/2019   Procedure: ENDARTERECTOMY LEFT FEMORAL ARTERY AND PROFUNDA  ARTERY;  Surgeon: Serafina Mitchell, MD;  Location: [facility];  Service: Vascular;  Laterality: Left;   FEMORAL-POPLITEAL BYPASS GRAFT Left 01/27/2019   Procedure: BYPASS GRAFT FEMORAL-POPLITEAL ARTERY;  Surgeon: Serafina Mitchell, MD;  Location: [facility];  Service: Vascular;  Laterality: Left;   FINGER AMPUTATION     Left second and third fingers, work accident   KNEE SURGERY Left    LOWER EXTREMITY ANGIOGRAM  05/2008  Left SFA atherectomy with diamondback 2.25 utilizing distal protection device [id number] placed in the left popliteal artery. Angioplasty of the left SFA with ATV balloon 6 x 40    PATCH ANGIOPLASTY Left 01/27/2019   Procedure: Patch Angioplasty using Vein;  Surgeon: Serafina Mitchell, MD;  Location: [facility];  Service: Vascular;  Laterality: Left;   PERIPHERAL VASCULAR BALLOON ANGIOPLASTY Left 06/17/2019   Procedure: PERIPHERAL VASCULAR BALLOON ANGIOPLASTY;  Surgeon: Marty Heck, MD;  Location: [facility];   Service: Cardiovascular;  Laterality: Left;  left proximal and mid bypass   SHOULDER SURGERY Right    VEIN HARVEST Left 01/27/2019   Procedure: Left Saphenous Vein Harvest;  Surgeon: Serafina Mitchell, MD;  [facility];  Service: Vascular;  Laterality: Left;     reports that he quit smoking about 10 years ago. His smoking use included cigarettes. He has a 40.00 pack-year smoking history. He has never used smokeless tobacco. He reports previous alcohol use. He reports that he does not use drugs.  No Known Allergies  Family Hx reviewed and non-contributory  Prior to Admission medications   Medication Sig Start Date End Date Taking? Authorizing Provider  amLODipine (NORVASC) 10 MG tablet Take 1 tablet (10 mg total) by mouth daily. 01/21/19   Masoudi, Dorthula Rue, MD  carvedilol (COREG CR) 40 MG 24 hr capsule Take 40 mg by mouth at bedtime.    [provider]  clopidogrel (PLAVIX) 75 MG tablet Take 75 mg by mouth daily.    [provider]  diflunisal (DOLOBID) 500 MG TABS tablet Take 500 mg by mouth 2 (two) times daily as needed (for pain management- take with food or milk).    [provider]  DULoxetine (CYMBALTA) 60 MG capsule Take 60 mg by mouth daily.    [provider]  fenofibrate 160 MG tablet Take 160 mg by mouth daily.    [provider]  fexofenadine (ALLEGRA) 180 MG tablet Take 180 mg by mouth daily as needed for allergies or rhinitis.    [provider]  Gabapentin, Once-Daily, (GRALISE) 600 MG TABS Take 1,200 mg by mouth at bedtime.    [provider]  hydroxypropyl methylcellulose / hypromellose (ISOPTO TEARS / GONIOVISC) 2.5 % ophthalmic solution Place 1 drop into both eyes daily as needed for dry eyes.    [provider]  insulin aspart (NOVOLOG FLEXPEN) 100 UNIT/ML FlexPen Inject 6-8 Units into the skin See admin instructions. Inject 6-8 units into the skin two times a day (morning and early afternoon)  as needed, per sliding scale: BGL 300-350 = 6 units; 351-400 = 8 units; >400 = CALL STAYWELL    [provider]  Insulin Degludec-Liraglutide (XULTOPHY) 100-3.6 UNIT-MG/ML SOPN Inject 22 Units into the skin daily before breakfast.     [provider]  levETIRAcetam (KEPPRA) 500 MG tablet Take 1 tablet (500 mg total) by mouth 2 (two) times daily. 01/20/19   Masoudi, Elhamalsadat, MD  linezolid (ZYVOX) 600 MG tablet Take 600 mg by mouth 2 (two) times daily.    [provider]  lisinopril (ZESTRIL) 40 MG tablet Take 40 mg by mouth daily.    [provider]  morphine (MS CONTIN) 15 MG 12 hr tablet Take 15 mg by mouth every 12 (twelve) hours.    [provider]  pantoprazole (PROTONIX) 40 MG tablet Take 40 mg by mouth daily before breakfast.    [provider]  Probiotic Product (ALIGN) 4 MG CAPS Take  4 mg by mouth daily.    [provider]  riboflavin (VITAMIN B-2) 100 MG TABS tablet Take 100 mg by mouth daily.    [provider]  rosuvastatin (CRESTOR) 20 MG tablet Take 20 mg by mouth daily.    [provider]  sodium chloride 1 g tablet Take 2 g by mouth See admin instructions. Take 2 grams by mouth in the morning and 2 grams at bedtime    [provider]  spironolactone (ALDACTONE) 25 MG tablet Take 25 mg by mouth daily.    [provider]  sulfamethoxazole-trimethoprim (BACTRIM DS) 800-160 MG tablet Take 1 tablet by mouth 2 (two) times daily. 07/30/19   Landis Martins, DPM  tamsulosin (FLOMAX) 0.4 MG CAPS capsule Take 0.4 mg by mouth at bedtime.    [provider]  traZODone (DESYREL) 100 MG tablet Take 100 mg by mouth at bedtime.    [provider]    Physical Exam: Vitals:   08/04/19 1759 08/04/19 2048  BP: (!) 165/59 (!) 180/79  Pulse: 68 66  Resp: 18 16  Temp: 98.1 F (36.7 C) 98.1 F (36.7 C)  TempSrc: Oral Oral  SpO2: 100% 100%  Weight: 97.5 kg   Height: 6\' 1"  (1.854  m)     Vitals:   08/04/19 1759 08/04/19 2048  BP: (!) 165/59 (!) 180/79  Pulse: 68 66  Resp: 18 16  Temp: 98.1 F (36.7 C) 98.1 F (36.7 C)  TempSrc: Oral Oral  SpO2: 100% 100%  Weight: 97.5 kg   Height: 6\' 1"  (1.854 m)      Constitutional: NAD, calm, comfortable Eyes: EOMI, lids and conjunctivae normal ENMT: Mucous membranes are moist. Posterior pharynx clear of any exudate or lesions.Normal dentition.  Neck: normal, supple, no masses, no thyromegaly Respiratory: clear to auscultation bilaterally, no wheezing, no crackles. Normal respiratory effort. No accessory muscle use.  Cardiovascular: Regular rate and rhythm, no murmurs / rubs / gallops. No extremity edema. 2+ pedal pulses. No carotid bruits.  Abdomen: no tenderness, no masses palpated. No hepatosplenomegaly. Bowel sounds positive.  Musculoskeletal: R foot atraumatic, no ulcer.  L foot 5th amputated, medial L foot with notable ulceration to the great toe, exposed bone.  Foul smelling, surround erythema, warmth extending proximally over dorsal surface of foot.  Foot is warm, malformed, and weak pulses. Skin: no rashes, lesions, ulcers. No induration Neurologic: CN 2-12 grossly intact. No gross motor or sensory deficits (sensory deficit to L foot present) Psychiatric: Normal judgment and insight. Alert and oriented x 3. Normal mood.   Labs on Admission: I have personally reviewed following labs and imaging studies  CBC: Recent Labs  Lab 08/04/19 1829  WBC 8.7  NEUTROABS 6.4  HGB 11.2*  HCT 34.5*  MCV 88.7  PLT 195   Basic Metabolic Panel: Recent Labs  Lab 08/04/19 1829  NA 125*  K 4.8  CL 92*  CO2 22  GLUCOSE 390*  BUN 21  CREATININE 1.28*  CALCIUM 8.8*   GFR: Estimated Creatinine Clearance: 68.8 mL/min (A) (by C-G formula based on SCr of 1.28 mg/dL (H)). Liver Function Tests: Recent Labs  Lab 08/04/19 1829  AST 66*  ALT 66*  ALKPHOS 93  BILITOT 0.5  PROT 7.2  ALBUMIN 2.5*   No results for  input(s): LIPASE, AMYLASE in the last 168 hours. No results for input(s): AMMONIA in the last 168 hours. Coagulation Profile: No results for input(s): INR, PROTIME in the last 168 hours. Cardiac Enzymes: No results  for input(s): CKTOTAL, CKMB, CKMBINDEX, TROPONINI in the last 168 hours. BNP (last 3 results) No results for input(s): PROBNP in the last 8760 hours. HbA1C: No results for input(s): HGBA1C in the last 72 hours. CBG: No results for input(s): GLUCAP in the last 168 hours. Lipid Profile: No results for input(s): CHOL, HDL, LDLCALC, TRIG, CHOLHDL, LDLDIRECT in the last 72 hours. Thyroid Function Tests: No results for input(s): TSH, T4TOTAL, FREET4, T3FREE, THYROIDAB in the last 72 hours. Anemia Panel: No results for input(s): VITAMINB12, FOLATE, FERRITIN, TIBC, IRON, RETICCTPCT in the last 72 hours. Urine analysis:    Component Value Date/Time   COLORURINE YELLOW 01/27/2019 1200   APPEARANCEUR HAZY (A) 01/27/2019 1200   LABSPEC 1.021 01/27/2019 1200   PHURINE 6.0 01/27/2019 1200   GLUCOSEU >=500 (A) 01/27/2019 1200   HGBUR NEGATIVE 01/27/2019 1200   BILIRUBINUR NEGATIVE 01/27/2019 1200   KETONESUR NEGATIVE 01/27/2019 1200   PROTEINUR 100 (A) 01/27/2019 1200   NITRITE NEGATIVE 01/27/2019 1200   LEUKOCYTESUR NEGATIVE 01/27/2019 1200    Radiological Exams on Admission: No results found.   Assessment/Plan Danny Carpenter is a 69 y.o. male with medical history significant of HTN, HLD, T2DM, PAD s/p, left common femoral and profundofemoral endarterectomy with vein patch angioplasty, and L femoral to above knee popliteal artery bypass graft with reverse ipsilateral translocated saphenous vein 12/2018 and s/p L 5th digit amputation presents with worsening non healing ulcer of L foot with concerning cellulitis and osteomyelitis admitted for planned TMA.  # Acute L foot osteomyelitis and cellulitis # PAD # Charcot foot, DM foot ulcer and infection - Patient has X-rays performed  with Podiatrist concern for osteo per review (obtaining records) - patient admitted for possible TMA but first requesting Vascular surgery eval and Podiatry in AM - without SIRS - consult Vascular Surgery and touch base with Podiatry in AM (plan for arterial dopplers and angio prior to surgery?) - (blood cultures not obtained, patient not septic, received Vanc/Ceftriaxone in ER) - empirically covering with Vanc and Zosyn (diabetic, risk for polymicrobial/anaerobes) - wound care consulted - continue clopidogrel, rosuvastatin - continued pta pain meds and gabapentin, duloxetine  # Hyponatremia - sodium corrects to ~132 for hyperglycemia - continue salt tabs  # AKI - held lisinopril, held NSAID (DOLOBID) - monitor I/O - avoid nephrotoxic agents  # Chronic Diastolic Heart Failure - Echo 12/2018 - on lisinopril, spironolactone, coreg; impaired relaxation but no comment on elevated pressures - carries chart hx  # HTN - continued coreg, amlodipine, held lisinopril  # HLD - continue fenofibrate and rosuvastatin  # T2DM - continue ISS and CBG - held home agents, started lantus but will need this titrated up - diabetic educator  # GERD - continue pantoprazole  # BPH - continue tamsulosin  # AAA - out-patient follow-up surveillance  # Seizure disorder? - not taking Keppra currently  DVT prophylaxis: SQH Code Status: Full Consults called: Consult Vascular Surgery and Podiatry in AM Admission status: inpatient   Truddie Hidden MD [facility] Pager [phone]  If 7PM-7AM, please contact night-coverage [email or web address] Password [email or web address]  08/04/2019, 10:04 PM

## 2019-08-04 NOTE — Progress Notes (Signed)
Subjective: Danny Carpenter is a 69 y.o. male patient seen in office for follow-up evaluation left foot ulcer.  Patient states that since using Betadine.  His daughter does the dressing changes daily.  Patient reports his blood sugar was 428 yesterday.  Reports that he has been off balance and has lost his appetite for the last 4 days with some episodes of nausea and increased burning pain on the top of the foot with more drainage.  Patient denies any other pedal complaints at this time.  Patient is assisted by son this visit as well  No other pedal complaints noted.  Patient Active Problem List   Diagnosis Date Noted  . AAA (abdominal aortic aneurysm) without rupture ([place]) 06/11/2019  . Hyperlipidemia 06/11/2019  . PICC (peripherally inserted central catheter) in place 02/16/2019  . Encounter for medication management 02/16/2019  . PAD (peripheral artery disease) ([place]) 01/27/2019  . Type 2 diabetes mellitus with other specified complication ([place])   . Subacute osteomyelitis of left foot ([place])   . PVD (peripheral vascular disease) ([place])   . CVA (cerebral vascular accident) ([place]) 01/17/2019  . Diabetic foot infection ([place]) 01/17/2019  . Weight loss 03/19/2015  . Chest wall pain 02/06/2015  . Chronic fatigue 01/09/2015  . Eye pain 09/23/2014  . Pain medication agreement completed 02/06/2014  . Chronic pain 04/05/2013  . Senile nuclear sclerosis 12/06/2011  . Tear film insufficiency 12/06/2011  . Depression 01/07/2005  . DDD (degenerative disc disease) 10/08/2004  . Proteinuria 10/21/2002   Current Outpatient Medications on File Prior to Visit  Medication Sig Dispense Refill  . amLODipine (NORVASC) 10 MG tablet Take 1 tablet (10 mg total) by mouth daily. 30 tablet 0  . carvedilol (COREG CR) 40 MG 24 hr capsule Take 40 mg by mouth at bedtime.    . clopidogrel (PLAVIX) 75 MG tablet Take 75 mg by mouth daily.    . diflunisal (DOLOBID) 500 MG TABS tablet Take 500 mg by mouth 2 (two) times  daily as needed (for pain management- take with food or milk).    . DULoxetine (CYMBALTA) 60 MG capsule Take 60 mg by mouth daily.    . fenofibrate 160 MG tablet Take 160 mg by mouth daily.    . fexofenadine (ALLEGRA) 180 MG tablet Take 180 mg by mouth daily as needed for allergies or rhinitis.    . Gabapentin, Once-Daily, (GRALISE) 600 MG TABS Take 1,200 mg by mouth at bedtime.    . hydroxypropyl methylcellulose / hypromellose (ISOPTO TEARS / GONIOVISC) 2.5 % ophthalmic solution Place 1 drop into both eyes daily as needed for dry eyes.    . insulin aspart (NOVOLOG FLEXPEN) 100 UNIT/ML FlexPen Inject 6-8 Units into the skin See admin instructions. Inject 6-8 units into the skin two times a day (morning and early afternoon) as needed, per sliding scale: BGL 300-350 = 6 units; 351-400 = 8 units; >400 = CALL STAYWELL    . Insulin Degludec-Liraglutide (XULTOPHY) 100-3.6 UNIT-MG/ML SOPN Inject 22 Units into the skin daily before breakfast.     . levETIRAcetam (KEPPRA) 500 MG tablet Take 1 tablet (500 mg total) by mouth 2 (two) times daily. 60 tablet 0  . linezolid (ZYVOX) 600 MG tablet Take 600 mg by mouth 2 (two) times daily.    Marland Kitchen lisinopril (ZESTRIL) 40 MG tablet Take 40 mg by mouth daily.    Marland Kitchen morphine (MS CONTIN) 15 MG 12 hr tablet Take 15 mg by mouth every 12 (twelve) hours.    Marland Kitchen  pantoprazole (PROTONIX) 40 MG tablet Take 40 mg by mouth daily before breakfast.    . Probiotic Product (ALIGN) 4 MG CAPS Take 4 mg by mouth daily.    . riboflavin (VITAMIN B-2) 100 MG TABS tablet Take 100 mg by mouth daily.    . rosuvastatin (CRESTOR) 20 MG tablet Take 20 mg by mouth daily.    . sodium chloride 1 g tablet Take 2 g by mouth See admin instructions. Take 2 grams by mouth in the morning and 2 grams at bedtime    . spironolactone (ALDACTONE) 25 MG tablet Take 25 mg by mouth daily.    Marland Kitchen sulfamethoxazole-trimethoprim (BACTRIM DS) 800-160 MG tablet Take 1 tablet by mouth 2 (two) times daily. 28 tablet 0  .  tamsulosin (FLOMAX) 0.4 MG CAPS capsule Take 0.4 mg by mouth at bedtime.    . traZODone (DESYREL) 100 MG tablet Take 100 mg by mouth at bedtime.     No current facility-administered medications on file prior to visit.   No Known Allergies  Recent Results (from the past 2160 hour(s))  I-STAT, chem 8     Status: Abnormal   Collection Time: 06/17/19  5:53 AM  Result Value Ref Range   Sodium 131 (L) 135 - 145 mmol/L   Potassium 4.2 3.5 - 5.1 mmol/L   Chloride 95 (L) 98 - 111 mmol/L   BUN 24 (H) 8 - 23 mg/dL   Creatinine, Ser 0.90 0.61 - 1.24 mg/dL   Glucose, Bld 259 (H) 70 - 99 mg/dL    Comment: Glucose reference range applies only to samples taken after fasting for at least 8 hours.   Calcium, Ion 1.09 (L) 1.15 - 1.40 mmol/L   TCO2 27 22 - 32 mmol/L   Hemoglobin 12.6 (L) 13.0 - 17.0 g/dL   HCT 37.0 (L) 39.0 - 52.0 %  POCT Activated clotting time     Status: None   Collection Time: 06/17/19  8:46 AM  Result Value Ref Range   Activated Clotting Time 197 seconds  Glucose, capillary     Status: Abnormal   Collection Time: 06/17/19  9:29 AM  Result Value Ref Range   Glucose-Capillary 250 (H) 70 - 99 mg/dL    Comment: Glucose reference range applies only to samples taken after fasting for at least 8 hours.   Comment 1 Document in Chart   POCT Activated clotting time     Status: None   Collection Time: 06/17/19  9:31 AM  Result Value Ref Range   Activated Clotting Time 164 seconds  Lactic acid, plasma     Status: None   Collection Time: 08/04/19  6:29 PM  Result Value Ref Range   Lactic Acid, Venous 1.1 0.5 - 1.9 mmol/L    Comment: Performed at [facility] [address] [place], [place] [address]  Comprehensive metabolic panel     Status: Abnormal   Collection Time: 08/04/19  6:29 PM  Result Value Ref Range   Sodium 125 (L) 135 - 145 mmol/L   Potassium 4.8 3.5 - 5.1 mmol/L   Chloride 92 (L) 98 - 111 mmol/L   CO2 22 22 - 32 mmol/L   Glucose, Bld 390 (H) 70 - 99 mg/dL     Comment: Glucose reference range applies only to samples taken after fasting for at least 8 hours.   BUN 21 8 - 23 mg/dL   Creatinine, Ser 1.28 (H) 0.61 - 1.24 mg/dL   Calcium 8.8 (L) 8.9 -  10.3 mg/dL   Total Protein 7.2 6.5 - 8.1 g/dL   Albumin 2.5 (L) 3.5 - 5.0 g/dL   AST 66 (H) 15 - 41 U/L   ALT 66 (H) 0 - 44 U/L   Alkaline Phosphatase 93 38 - 126 U/L   Total Bilirubin 0.5 0.3 - 1.2 mg/dL   GFR calc non Af Amer 58 (L) >60 mL/min   GFR calc Af Amer >60 >60 mL/min   Anion gap 11 5 - 15    Comment: Performed at [facility] [address] [place], [place] [address]  CBC with Differential     Status: Abnormal   Collection Time: 08/04/19  6:29 PM  Result Value Ref Range   WBC 8.7 4.0 - 10.5 K/uL   RBC 3.89 (L) 4.22 - 5.81 MIL/uL   Hemoglobin 11.2 (L) 13.0 - 17.0 g/dL   HCT 34.5 (L) 39.0 - 52.0 %   MCV 88.7 80.0 - 100.0 fL   MCH 28.8 26.0 - 34.0 pg   MCHC 32.5 30.0 - 36.0 g/dL   RDW 12.6 11.5 - 15.5 %   Platelets 329 150 - 400 K/uL   nRBC 0.0 0.0 - 0.2 %   Neutrophils Relative % 73 %   Neutro Abs 6.4 1.7 - 7.7 K/uL   Lymphocytes Relative 13 %   Lymphs Abs 1.1 0.7 - 4.0 K/uL   Monocytes Relative 9 %   Monocytes Absolute 0.8 0.1 - 1.0 K/uL   Eosinophils Relative 3 %   Eosinophils Absolute 0.2 0.0 - 0.5 K/uL   Basophils Relative 1 %   Basophils Absolute 0.1 0.0 - 0.1 K/uL   Immature Granulocytes 1 %   Abs Immature Granulocytes 0.08 (H) 0.00 - 0.07 K/uL    Comment: Performed at [facility] [address], [place], [place] [address]    Objective: There were no vitals filed for this visit.  General: Patient is awake, alert, oriented x 3 and in no acute distress.  Dermatology: Skin is warm and dry bilateral with a now full thickness ulceration present medial great toe on the left with exposed capsule and bone and fibronecrotic slough. Ulceration measures or cm x 3.5 cm x 0.4cm, post debridement. There is a moderate maceration with clear active drainage and  significant worsening erythema with increased warmth and edema.  Previous pinpoint ulcerations at the previous amputation stump site has healed and have dry scabs to the area.  Vascular: Dorsalis Pedis pulse = 1/4 Bilateral,  Posterior Tibial pulse = 0/4 Bilateral,  Capillary Fill Time < 5 seconds  Neurologic: Protective sensation severely diminished on the left.  Musculosketal: There is significant left foot deformity patient has a Z foot type with a very large bunion and crossover deformity of the hallux onto the lesser toes with protrusion of the fifth metatarsal base at previous amputation stump site on left as previously noted.  No results for input(s): GRAMSTAIN, LABORGA in the last 8760 hours.  Assessment and Plan:  Problem List Items Addressed This Visit      Cardiovascular and Mediastinum   PAD (peripheral artery disease) ([place])    Other Visit Diagnoses    Foot ulcer, left, with necrosis of bone ([place])    -  Primary   Relevant Orders   DG Foot Complete Left   Cellulitis and abscess of toe of left foot       Acute osteomyelitis of left ankle or foot ([place])       Charcot  foot due to diabetes mellitus ([place])       History of amputation of lesser toe of left foot ([place])          -Examined patient and Re-discussed the progression of the wound and treatment alternatives. -X-rays were reviewed. -Applied Betadine dry dressing to the left foot -Advised patient to go to ER for admission due to emphysema around the first metatarsophalangeal joint as well as worsening ulcer with constitutional symptoms concerning for septic joint versus osteomyelitis with abscess -Discussed case with Dr. Posey Pronto at [facility] however Dr. Posey Pronto declined to take admission because she felt that the patient needed to go to [facility] because of his history of severe PAD and at [facility] they do not have access to vascular services so it was recommended and patient was advised to go to [facility] instead of [facility] for  admission for left foot cellulitis with septic arthritis versus osteomyelitis with abscess -Continue with postoperative shoe -Discussed case with patient and son who are agreeable to hospital for admission since patient has failed previous outpatient antibiotic therapy and local wound care.  Discussed with patient at the admission would require vascular evaluation and likely surgery which may include amputation transmetatarsal however must see vascular first in order to determine if he could heal a transmetatarsal amputation -Return after discharge from [facility], DPM

## 2019-08-05 ENCOUNTER — Encounter (HOSPITAL_COMMUNITY): Payer: Self-pay | Admitting: Internal Medicine

## 2019-08-05 DIAGNOSIS — M86672 Other chronic osteomyelitis, left ankle and foot: Secondary | ICD-10-CM

## 2019-08-05 DIAGNOSIS — Z79899 Other long term (current) drug therapy: Secondary | ICD-10-CM | POA: Diagnosis not present

## 2019-08-05 DIAGNOSIS — Z794 Long term (current) use of insulin: Secondary | ICD-10-CM | POA: Diagnosis not present

## 2019-08-05 DIAGNOSIS — Z79891 Long term (current) use of opiate analgesic: Secondary | ICD-10-CM | POA: Diagnosis not present

## 2019-08-05 DIAGNOSIS — J449 Chronic obstructive pulmonary disease, unspecified: Secondary | ICD-10-CM | POA: Diagnosis not present

## 2019-08-05 DIAGNOSIS — M86172 Other acute osteomyelitis, left ankle and foot: Secondary | ICD-10-CM | POA: Diagnosis present

## 2019-08-05 DIAGNOSIS — N179 Acute kidney failure, unspecified: Secondary | ICD-10-CM | POA: Diagnosis present

## 2019-08-05 DIAGNOSIS — M86272 Subacute osteomyelitis, left ankle and foot: Secondary | ICD-10-CM | POA: Diagnosis not present

## 2019-08-05 DIAGNOSIS — L089 Local infection of the skin and subcutaneous tissue, unspecified: Secondary | ICD-10-CM | POA: Diagnosis not present

## 2019-08-05 DIAGNOSIS — I714 Abdominal aortic aneurysm, without rupture: Secondary | ICD-10-CM | POA: Diagnosis not present

## 2019-08-05 DIAGNOSIS — E1165 Type 2 diabetes mellitus with hyperglycemia: Secondary | ICD-10-CM | POA: Diagnosis present

## 2019-08-05 DIAGNOSIS — I5032 Chronic diastolic (congestive) heart failure: Secondary | ICD-10-CM | POA: Diagnosis present

## 2019-08-05 DIAGNOSIS — Z8673 Personal history of transient ischemic attack (TIA), and cerebral infarction without residual deficits: Secondary | ICD-10-CM | POA: Diagnosis not present

## 2019-08-05 DIAGNOSIS — I739 Peripheral vascular disease, unspecified: Secondary | ICD-10-CM | POA: Diagnosis not present

## 2019-08-05 DIAGNOSIS — I110 Hypertensive heart disease with heart failure: Secondary | ICD-10-CM | POA: Diagnosis present

## 2019-08-05 DIAGNOSIS — Z20822 Contact with and (suspected) exposure to covid-19: Secondary | ICD-10-CM | POA: Diagnosis present

## 2019-08-05 DIAGNOSIS — E11621 Type 2 diabetes mellitus with foot ulcer: Secondary | ICD-10-CM | POA: Diagnosis present

## 2019-08-05 DIAGNOSIS — N400 Enlarged prostate without lower urinary tract symptoms: Secondary | ICD-10-CM | POA: Diagnosis present

## 2019-08-05 DIAGNOSIS — Z87891 Personal history of nicotine dependence: Secondary | ICD-10-CM | POA: Diagnosis not present

## 2019-08-05 DIAGNOSIS — M869 Osteomyelitis, unspecified: Secondary | ICD-10-CM | POA: Diagnosis present

## 2019-08-05 DIAGNOSIS — Z7902 Long term (current) use of antithrombotics/antiplatelets: Secondary | ICD-10-CM | POA: Diagnosis not present

## 2019-08-05 DIAGNOSIS — E11610 Type 2 diabetes mellitus with diabetic neuropathic arthropathy: Secondary | ICD-10-CM | POA: Diagnosis present

## 2019-08-05 DIAGNOSIS — E871 Hypo-osmolality and hyponatremia: Secondary | ICD-10-CM | POA: Diagnosis present

## 2019-08-05 DIAGNOSIS — E11628 Type 2 diabetes mellitus with other skin complications: Secondary | ICD-10-CM | POA: Diagnosis not present

## 2019-08-05 DIAGNOSIS — E785 Hyperlipidemia, unspecified: Secondary | ICD-10-CM | POA: Diagnosis present

## 2019-08-05 DIAGNOSIS — E1169 Type 2 diabetes mellitus with other specified complication: Secondary | ICD-10-CM | POA: Diagnosis present

## 2019-08-05 DIAGNOSIS — E7800 Pure hypercholesterolemia, unspecified: Secondary | ICD-10-CM | POA: Diagnosis present

## 2019-08-05 DIAGNOSIS — K219 Gastro-esophageal reflux disease without esophagitis: Secondary | ICD-10-CM | POA: Diagnosis present

## 2019-08-05 DIAGNOSIS — I252 Old myocardial infarction: Secondary | ICD-10-CM | POA: Diagnosis not present

## 2019-08-05 DIAGNOSIS — L97529 Non-pressure chronic ulcer of other part of left foot with unspecified severity: Secondary | ICD-10-CM | POA: Diagnosis present

## 2019-08-05 HISTORY — DX: Osteomyelitis, unspecified: M86.9

## 2019-08-05 LAB — CBC
HCT: 33.5 % — ABNORMAL LOW (ref 39.0–52.0)
Hemoglobin: 10.9 g/dL — ABNORMAL LOW (ref 13.0–17.0)
MCH: 28.5 pg (ref 26.0–34.0)
MCHC: 32.5 g/dL (ref 30.0–36.0)
MCV: 87.5 fL (ref 80.0–100.0)
Platelets: 342 10*3/uL (ref 150–400)
RBC: 3.83 MIL/uL — ABNORMAL LOW (ref 4.22–5.81)
RDW: 12.6 % (ref 11.5–15.5)
WBC: 7.2 10*3/uL (ref 4.0–10.5)
nRBC: 0 % (ref 0.0–0.2)

## 2019-08-05 LAB — BASIC METABOLIC PANEL
Anion gap: 7 (ref 5–15)
BUN: 16 mg/dL (ref 8–23)
CO2: 23 mmol/L (ref 22–32)
Calcium: 8.5 mg/dL — ABNORMAL LOW (ref 8.9–10.3)
Chloride: 96 mmol/L — ABNORMAL LOW (ref 98–111)
Creatinine, Ser: 1.13 mg/dL (ref 0.61–1.24)
GFR calc Af Amer: >60 mL/min (ref >60)
GFR calc non Af Amer: >60 mL/min (ref >60)
Glucose, Bld: 287 mg/dL — ABNORMAL HIGH (ref 70–99)
Potassium: 4.7 mmol/L (ref 3.5–5.1)
Sodium: 126 mmol/L — ABNORMAL LOW (ref 135–145)

## 2019-08-05 LAB — HEMOGLOBIN A1C
Hgb A1c MFr Bld: 11.6 % — ABNORMAL HIGH (ref 4.8–5.6)
Mean Plasma Glucose: 286.22 mg/dL

## 2019-08-05 LAB — GLUCOSE, CAPILLARY
Glucose-Capillary: 223 mg/dL — ABNORMAL HIGH (ref 70–99)
Glucose-Capillary: 232 mg/dL — ABNORMAL HIGH (ref 70–99)

## 2019-08-05 LAB — CBG MONITORING, ED
Glucose-Capillary: 286 mg/dL — ABNORMAL HIGH (ref 70–99)
Glucose-Capillary: 257 mg/dL — ABNORMAL HIGH (ref 70–99)
Glucose-Capillary: 262 mg/dL — ABNORMAL HIGH (ref 70–99)

## 2019-08-05 LAB — SURGICAL PCR SCREEN
MRSA, PCR: NEGATIVE
Staphylococcus aureus: NEGATIVE

## 2019-08-05 MED ORDER — B COMPLEX-C PO TABS
1.0000 | ORAL_TABLET | Freq: Every day | ORAL | Status: DC
  Administered 2019-08-05 – 2019-08-08 (×4): 1 via ORAL
  Filled 2019-08-05 (×4): qty 1

## 2019-08-05 MED ORDER — VANCOMYCIN HCL 1250 MG/250ML IV SOLN
1250.0000 mg | Freq: Two times a day (BID) | INTRAVENOUS | Status: DC
  Administered 2019-08-05 – 2019-08-08 (×6): 1250 mg via INTRAVENOUS
  Filled 2019-08-05 (×8): qty 250

## 2019-08-05 MED ORDER — TAMSULOSIN HCL 0.4 MG PO CAPS
0.4000 mg | ORAL_CAPSULE | Freq: Every day | ORAL | Status: DC
  Administered 2019-08-05 – 2019-08-07 (×3): 0.4 mg via ORAL
  Filled 2019-08-05 (×3): qty 1

## 2019-08-05 MED ORDER — INSULIN ASPART 100 UNIT/ML ~~LOC~~ SOLN
0.0000 [IU] | Freq: Every day | SUBCUTANEOUS | Status: DC
  Administered 2019-08-05: 2 [IU] via SUBCUTANEOUS
  Administered 2019-08-07: 3 [IU] via SUBCUTANEOUS

## 2019-08-05 MED ORDER — HEPARIN SODIUM (PORCINE) 5000 UNIT/ML IJ SOLN
5000.0000 [IU] | Freq: Three times a day (TID) | INTRAMUSCULAR | Status: DC
  Administered 2019-08-05 – 2019-08-08 (×8): 5000 [IU] via SUBCUTANEOUS
  Filled 2019-08-05 (×8): qty 1

## 2019-08-05 MED ORDER — SPIRONOLACTONE 25 MG PO TABS
25.0000 mg | ORAL_TABLET | Freq: Every day | ORAL | Status: DC
  Administered 2019-08-05 – 2019-08-08 (×4): 25 mg via ORAL
  Filled 2019-08-05 (×4): qty 1

## 2019-08-05 MED ORDER — CLOPIDOGREL BISULFATE 75 MG PO TABS
75.0000 mg | ORAL_TABLET | Freq: Every day | ORAL | Status: DC
  Administered 2019-08-05 – 2019-08-08 (×4): 75 mg via ORAL
  Filled 2019-08-05 (×4): qty 1

## 2019-08-05 MED ORDER — PIPERACILLIN-TAZOBACTAM 3.375 G IVPB
3.3750 g | Freq: Three times a day (TID) | INTRAVENOUS | Status: DC
  Administered 2019-08-05 – 2019-08-08 (×10): 3.375 g via INTRAVENOUS
  Filled 2019-08-05 (×10): qty 50

## 2019-08-05 MED ORDER — DOCUSATE SODIUM 100 MG PO CAPS
100.0000 mg | ORAL_CAPSULE | Freq: Two times a day (BID) | ORAL | Status: DC
  Administered 2019-08-05 – 2019-08-08 (×7): 100 mg via ORAL
  Filled 2019-08-05 (×7): qty 1

## 2019-08-05 MED ORDER — RISAQUAD PO CAPS
1.0000 | ORAL_CAPSULE | Freq: Every day | ORAL | Status: DC
  Administered 2019-08-05 – 2019-08-08 (×4): 1 via ORAL
  Filled 2019-08-05 (×4): qty 1

## 2019-08-05 MED ORDER — TRAZODONE HCL 50 MG PO TABS
50.0000 mg | ORAL_TABLET | Freq: Every day | ORAL | Status: DC
  Administered 2019-08-05 – 2019-08-07 (×3): 50 mg via ORAL
  Filled 2019-08-05 (×3): qty 1

## 2019-08-05 MED ORDER — HEPARIN SODIUM (PORCINE) 5000 UNIT/ML IJ SOLN
5000.0000 [IU] | Freq: Two times a day (BID) | INTRAMUSCULAR | Status: DC
  Administered 2019-08-05: 5000 [IU] via SUBCUTANEOUS
  Filled 2019-08-05: qty 1

## 2019-08-05 MED ORDER — SODIUM CHLORIDE 0.9% FLUSH
3.0000 mL | Freq: Two times a day (BID) | INTRAVENOUS | Status: DC
  Administered 2019-08-05 – 2019-08-08 (×4): 3 mL via INTRAVENOUS

## 2019-08-05 MED ORDER — DULOXETINE HCL 60 MG PO CPEP
60.0000 mg | ORAL_CAPSULE | Freq: Every day | ORAL | Status: DC
  Administered 2019-08-05 – 2019-08-08 (×4): 60 mg via ORAL
  Filled 2019-08-05 (×4): qty 1

## 2019-08-05 MED ORDER — ACETAMINOPHEN 325 MG PO TABS
650.0000 mg | ORAL_TABLET | Freq: Four times a day (QID) | ORAL | Status: DC | PRN
  Administered 2019-08-05 – 2019-08-07 (×6): 650 mg via ORAL
  Filled 2019-08-05 (×5): qty 2

## 2019-08-05 MED ORDER — LORATADINE 10 MG PO TABS
10.0000 mg | ORAL_TABLET | Freq: Every day | ORAL | Status: DC
  Administered 2019-08-05 – 2019-08-08 (×4): 10 mg via ORAL
  Filled 2019-08-05 (×4): qty 1

## 2019-08-05 MED ORDER — HYPROMELLOSE (GONIOSCOPIC) 2.5 % OP SOLN: 1.0000 [drp] | Freq: Every day | OPHTHALMIC | Status: DC | PRN

## 2019-08-05 MED ORDER — AMLODIPINE BESYLATE 10 MG PO TABS
10.0000 mg | ORAL_TABLET | Freq: Every day | ORAL | Status: DC
  Administered 2019-08-05 – 2019-08-08 (×4): 10 mg via ORAL
  Filled 2019-08-05 (×2): qty 1
  Filled 2019-08-05: qty 2
  Filled 2019-08-05: qty 1

## 2019-08-05 MED ORDER — SODIUM CHLORIDE 1 G PO TABS
2.0000 g | ORAL_TABLET | Freq: Two times a day (BID) | ORAL | Status: DC
  Administered 2019-08-05 – 2019-08-08 (×7): 2 g via ORAL
  Filled 2019-08-05 (×8): qty 2

## 2019-08-05 MED ORDER — OXYCODONE HCL 5 MG PO TABS
5.0000 mg | ORAL_TABLET | ORAL | Status: DC | PRN
  Administered 2019-08-07 – 2019-08-08 (×6): 5 mg via ORAL
  Filled 2019-08-05 (×6): qty 1

## 2019-08-05 MED ORDER — INSULIN ASPART 100 UNIT/ML ~~LOC~~ SOLN
0.0000 [IU] | Freq: Three times a day (TID) | SUBCUTANEOUS | Status: DC
  Administered 2019-08-05 (×2): 5 [IU] via SUBCUTANEOUS
  Administered 2019-08-05: 3 [IU] via SUBCUTANEOUS
  Administered 2019-08-06 – 2019-08-07 (×3): 7 [IU] via SUBCUTANEOUS
  Administered 2019-08-07: 9 [IU] via SUBCUTANEOUS
  Administered 2019-08-07: 5 [IU] via SUBCUTANEOUS
  Administered 2019-08-08: 7 [IU] via SUBCUTANEOUS
  Administered 2019-08-08: 9 [IU] via SUBCUTANEOUS

## 2019-08-05 MED ORDER — CARVEDILOL PHOSPHATE ER 20 MG PO CP24
40.0000 mg | ORAL_CAPSULE | Freq: Every day | ORAL | Status: DC
  Administered 2019-08-05 – 2019-08-07 (×3): 40 mg via ORAL
  Filled 2019-08-05: qty 2
  Filled 2019-08-05: qty 1
  Filled 2019-08-05 (×3): qty 2

## 2019-08-05 MED ORDER — PANTOPRAZOLE SODIUM 40 MG PO TBEC
40.0000 mg | DELAYED_RELEASE_TABLET | Freq: Every day | ORAL | Status: DC
  Administered 2019-08-05 – 2019-08-08 (×4): 40 mg via ORAL
  Filled 2019-08-05 (×4): qty 1

## 2019-08-05 MED ORDER — MORPHINE SULFATE ER 15 MG PO TBCR
15.0000 mg | EXTENDED_RELEASE_TABLET | Freq: Two times a day (BID) | ORAL | Status: DC
  Administered 2019-08-05 – 2019-08-08 (×8): 15 mg via ORAL
  Filled 2019-08-05 (×8): qty 1

## 2019-08-05 MED ORDER — INSULIN GLARGINE 100 UNIT/ML ~~LOC~~ SOLN
5.0000 [IU] | Freq: Every day | SUBCUTANEOUS | Status: DC
  Administered 2019-08-05 – 2019-08-08 (×4): 5 [IU] via SUBCUTANEOUS
  Filled 2019-08-05 (×5): qty 0.05

## 2019-08-05 MED ORDER — ROSUVASTATIN CALCIUM 20 MG PO TABS
20.0000 mg | ORAL_TABLET | Freq: Every day | ORAL | Status: DC
  Administered 2019-08-05 – 2019-08-08 (×4): 20 mg via ORAL
  Filled 2019-08-05 (×3): qty 1
  Filled 2019-08-05: qty 4

## 2019-08-05 MED ORDER — ACETAMINOPHEN 650 MG RE SUPP: 650.0000 mg | Freq: Four times a day (QID) | RECTAL | Status: DC | PRN

## 2019-08-05 MED ORDER — GABAPENTIN 300 MG PO CAPS
300.0000 mg | ORAL_CAPSULE | Freq: Two times a day (BID) | ORAL | Status: DC
  Administered 2019-08-05 – 2019-08-08 (×7): 300 mg via ORAL
  Filled 2019-08-05 (×8): qty 1

## 2019-08-05 MED ORDER — FENOFIBRATE 160 MG PO TABS
160.0000 mg | ORAL_TABLET | Freq: Every day | ORAL | Status: DC
  Administered 2019-08-05 – 2019-08-08 (×4): 160 mg via ORAL
  Filled 2019-08-05 (×4): qty 1

## 2019-08-05 NOTE — Consult Note (Signed)
WOC Nurse Consult Note: Patient receiving care in [facility] ED 24.  Consult completed remotely after review of record. Reason for Consult: "Diabetic foot ulcer" Wound type:  Per Dr. Loni Muse. Chanra's note from 08/04/19 at 10:04 p.m. " L foot 5th amputated, medial L foot with notable ulceration to the great toe, exposed bone.  Foul smelling, surround erythema, warmth extending proximally over dorsal surface of foot.  Foot is warm, malformed, and weak pulses." Patient requesting Vascular and Podiatry consult, appears to be headed toward surgical intervention. Pressure Injury POA: Yes/No/NA Measurement: Wound bed: Drainage (amount, consistency, odor)  Periwound: Dressing procedure/placement/frequency:  I have ordered to wrap the foot in gauze and kerlex and to change daily and prn soilage. Thank you for the consult. [facility] nurse will not follow at this time.  Please re-consult the [place] team if needed.  Val Riles, RN, MSN, CWOCN, CNS-BC, pager [phone]

## 2019-08-05 NOTE — Consult Note (Signed)
Podiatry Consult Note From: Dr. Servando Snare Consultant:Dr. Landis Martins Reason: Surgical (Amputation)   HPI  Danny Carpenter is a 69 y.o. male patient well known to my outpatient practice. Was sent to ER from my office on yesterday for Left foot infection with bone exposure 1st metatarsal head with emphysema on xray concerning for septic arthrits vs advancing osteomyelitis with abscess. Patient reports that PM that he feel good. Less dizzy/unsteady and has had a better appetitie today. Denies any other constitutional symptoms at this time.  Active Ambulatory Problems    Diagnosis Date Noted  . CVA (cerebral vascular accident) ([place]) 01/17/2019  . Diabetic foot infection ([name]) 01/17/2019  . Subacute osteomyelitis of left foot ([place])   . PVD (peripheral vascular disease) ([place])   . Type 2 diabetes mellitus with other specified complication ([place])   . PAD (peripheral artery disease) ([place]) 01/27/2019  . PICC (peripherally inserted central catheter) in place 02/16/2019  . Encounter for medication management 02/16/2019  . AAA (abdominal aortic aneurysm) without rupture ([place]) 06/11/2019  . Chest wall pain 02/06/2015  . Chronic fatigue 01/09/2015  . Chronic pain 04/05/2013  . DDD (degenerative disc disease) 10/08/2004  . Depression 01/07/2005  . Eye pain 09/23/2014  . Hyperlipidemia 06/11/2019  . Pain medication agreement completed 02/06/2014  . Proteinuria 10/21/2002  . Senile nuclear sclerosis 12/06/2011  . Tear film insufficiency 12/06/2011  . Weight loss 03/19/2015   Resolved Ambulatory Problems    Diagnosis Date Noted  . No Resolved Ambulatory Problems   Past Medical History:  Diagnosis Date  . AAA (abdominal aortic aneurysm) ([place])   . Anxiety   . Arthritis   . CHF (congestive heart failure) ([place])   . COPD (chronic obstructive pulmonary disease) ([place])   . DM2 (diabetes mellitus, type 2) ([place])   . Fracture of rib   . GERD (gastroesophageal reflux disease)   . HTN  (hypertension)   . Hypercholesteremia   . Myocardial infarction ([place])   . Osteomyelitis ([place]) 08/05/2019  . Peripheral vascular disease ([place])   . PONV (postoperative nausea and vomiting)   . Seizure ([place])   . Sleep apnea     Past Surgical History:  Procedure Laterality Date  . ABDOMINAL AORTOGRAM W/LOWER EXTREMITY N/A 01/19/2019   Procedure: ABDOMINAL AORTOGRAM W/LOWER EXTREMITY;  Surgeon: Serafina Mitchell, MD;  Location: [facility];  Service: Cardiovascular;  Laterality: N/A;  . ABDOMINAL AORTOGRAM W/LOWER EXTREMITY Left 06/17/2019   Procedure: ABDOMINAL AORTOGRAM W/LOWER EXTREMITY;  Surgeon: Marty Heck, MD;  Location: [facility];  Service: Cardiovascular;  Laterality: Left;  . AMPUTATION Left 02/22/2019   Procedure: LEFT FOOT 5TH RAY AMPUTATION;  Surgeon: Newt Minion, MD;  Location: [facility];  Service: Orthopedics;  Laterality: Left;  . APPENDECTOMY    . ENDARTERECTOMY FEMORAL Left 01/27/2019   Procedure: ENDARTERECTOMY LEFT FEMORAL ARTERY AND PROFUNDA  ARTERY;  Surgeon: Serafina Mitchell, MD;  Location: [facility];  Service: Vascular;  Laterality: Left;  . FEMORAL-POPLITEAL BYPASS GRAFT Left 01/27/2019   Procedure: BYPASS GRAFT FEMORAL-POPLITEAL ARTERY;  Surgeon: Serafina Mitchell, MD;  Location: [facility];  Service: Vascular;  Laterality: Left;  . FINGER AMPUTATION     Left second and third fingers, work accident  . KNEE SURGERY Left   . LOWER EXTREMITY ANGIOGRAM  05/2008   Left SFA atherectomy with diamondback 2.25 utilizing distal protection device NAV6 placed in the left popliteal artery. Angioplasty of the left SFA with ATV balloon 6 x 40   .  PATCH ANGIOPLASTY Left 01/27/2019   Procedure: Patch Angioplasty using Vein;  Surgeon: Serafina Mitchell, MD;  Location: [facility];  Service: Vascular;  Laterality: Left;  . PERIPHERAL VASCULAR BALLOON ANGIOPLASTY Left 06/17/2019   Procedure: PERIPHERAL VASCULAR BALLOON ANGIOPLASTY;  Surgeon: Marty Heck, MD;  Location: [facility];  Service: Cardiovascular;  Laterality: Left;  left proximal and mid bypass  . SHOULDER SURGERY Right   . VEIN HARVEST Left 01/27/2019   Procedure: Left Saphenous Vein Harvest;  Surgeon: Serafina Mitchell, MD;  Location: [facility];  Service: Vascular;  Laterality: Left;    Current Facility-Administered Medications:  .  acetaminophen (TYLENOL) tablet 650 mg, 650 mg, Oral, Q6H PRN, 650 mg at 08/05/19 1713 **OR** acetaminophen (TYLENOL) suppository 650 mg, 650 mg, Rectal, Q6H PRN, Truddie Hidden, MD .  acidophilus (RISAQUAD) capsule 1 capsule, 1 capsule, Oral, Daily, Truddie Hidden, MD, 1 capsule at 08/05/19 1055 .  amLODipine (NORVASC) tablet 10 mg, 10 mg, Oral, Daily, Truddie Hidden, MD, 10 mg at 08/05/19 1051 .  B-complex with vitamin C tablet 1 tablet, 1 tablet, Oral, Daily, Truddie Hidden, MD, 1 tablet at 08/05/19 1055 .  carvedilol (COREG CR) 24 hr capsule 40 mg, 40 mg, Oral, QHS, Truddie Hidden, MD .  clopidogrel (PLAVIX) tablet 75 mg, 75 mg, Oral, Daily, Truddie Hidden, MD, 75 mg at 08/05/19 1052 .  docusate sodium (COLACE) capsule 100 mg, 100 mg, Oral, BID, Truddie Hidden, MD, 100 mg at 08/05/19 8338 .  DULoxetine (CYMBALTA) DR capsule 60 mg, 60 mg, Oral, Daily, Truddie Hidden, MD, 60 mg at 08/05/19 1054 .  fenofibrate tablet 160 mg, 160 mg, Oral, Daily, Truddie Hidden, MD, 160 mg at 08/05/19 1054 .  gabapentin (NEURONTIN) capsule 300 mg, 300 mg, Oral, BID, Truddie Hidden, MD, 300 mg at 08/05/19 1054 .  heparin injection 5,000 Units, 5,000 Units, Subcutaneous, Q8H, Little Ishikawa, MD, 5,000 Units at 08/05/19 1420 .  hydroxypropyl methylcellulose / hypromellose (ISOPTO TEARS / GONIOVISC) 2.5 % ophthalmic solution 1 drop, 1 drop, Both Eyes, Daily PRN, Truddie Hidden, MD .  insulin aspart (novoLOG) injection 0-5 Units, 0-5 Units, Subcutaneous, QHS, Truddie Hidden, MD .  insulin aspart (novoLOG) injection 0-9 Units, 0-9 Units, Subcutaneous, TID WC,  Truddie Hidden, MD, 3 Units at 08/05/19 1732 .  insulin glargine (LANTUS) injection 5 Units, 5 Units, Subcutaneous, Daily, Truddie Hidden, MD, 5 Units at 08/05/19 1050 .  loratadine (CLARITIN) tablet 10 mg, 10 mg, Oral, Daily, Truddie Hidden, MD, 10 mg at 08/05/19 1051 .  morphine (MS CONTIN) 12 hr tablet 15 mg, 15 mg, Oral, Q12H, Truddie Hidden, MD, 15 mg at 08/05/19 1052 .  oxyCODONE (Oxy IR/ROXICODONE) immediate release tablet 5 mg, 5 mg, Oral, Q4H PRN, Truddie Hidden, MD .  pantoprazole (PROTONIX) EC tablet 40 mg, 40 mg, Oral, QAC breakfast, Truddie Hidden, MD, 40 mg at 08/05/19 [phone] .  piperacillin-tazobactam (ZOSYN) IVPB 3.375 g, 3.375 g, Intravenous, Q8H, Delo, Douglas, MD, Last Rate: 12.5 mL/hr at 08/05/19 1424, 3.375 g at 08/05/19 1424 .  rosuvastatin (CRESTOR) tablet 20 mg, 20 mg, Oral, Daily, Truddie Hidden, MD, 20 mg at 08/05/19 1052 .  sodium chloride flush (NS) 0.9 % injection 3 mL, 3 mL, Intravenous, Q12H, Truddie Hidden, MD, 3 mL at 08/05/19 1056 .  sodium chloride tablet 2 g, 2 g, Oral, BID AC & HS, Truddie Hidden, MD, 2 g at 08/05/19 3976 .  spironolactone (ALDACTONE) tablet 25 mg, 25 mg, Oral, Daily, Truddie Hidden, MD, 25 mg at  08/05/19 1053 .  tamsulosin (FLOMAX) capsule 0.4 mg, 0.4 mg, Oral, QHS, Truddie Hidden, MD .  traZODone (DESYREL) tablet 50 mg, 50 mg, Oral, QHS, Truddie Hidden, MD .  vancomycin (VANCOREADY) IVPB 1250 mg/250 mL, 1,250 mg, Intravenous, Q12H, Karren Cobble, RPH, Last Rate: 166.7 mL/hr at 08/05/19 1530, 1,250 mg at 08/05/19 1530  No Known Allergies  Social History   Socioeconomic History  . Marital status: Married    Spouse name: Not on file  . Number of children: Not on file  . Years of education: Not on file  . Highest education level: Not on file  Occupational History  . Occupation: Disabled Administrator  Tobacco Use  . Smoking status: Former Smoker    Packs/day: 1.00    Years: 40.00    Pack years: 40.00     Types: Cigarettes    Quit date: 04/01/2009    Years since quitting: 10.3  . Smokeless tobacco: Never Used  Substance and Sexual Activity  . Alcohol use: Not Currently  . Drug use: Never  . Sexual activity: Not on file  Other Topics Concern  . Not on file  Social History Narrative   Lives with wife in [place]   Social Determinants of Health   Financial Resource Strain:   . Difficulty of Paying Living Expenses:   Food Insecurity:   . Worried About Charity fundraiser in the Last Year:   . Arboriculturist in the Last Year:   Transportation Needs:   . Film/video editor (Medical):   Marland Kitchen Lack of Transportation (Non-Medical):   Physical Activity:   . Days of Exercise per Week:   . Minutes of Exercise per Session:   Stress:   . Feeling of Stress :   Social Connections:   . Frequency of Communication with Friends and Family:   . Frequency of Social Gatherings with Friends and Family:   . Attends Religious Services:   . Active Member of Clubs or Organizations:   . Attends Archivist Meetings:   Marland Kitchen Marital Status:     History reviewed. No pertinent family history.  Review of Systems  All other systems reviewed and are negative.  Physical exam:  Today's Vitals   08/05/19 1051 08/05/19 1100 08/05/19 1200 08/05/19 1240  BP: (!) 172/64 (!) 139/121    Pulse:  73    Resp:      Temp:   98.3 F (36.8 C)   TempSrc:   Oral   SpO2:  98%    Weight:      Height:      PainSc:    2     Outer dressing with mild bloody strikethrough  Exam unchanged from yesterday Dermatology: Skin is warm and dry bilateral with a now full thickness ulceration present medial great toe on the left. Ulceration measures 3 cm x 2.5 cm x 0.4cm, post debridement. There is a moderate macerated border with a fibrogranular base and exposed capsule probes to bone there is no malodor but clear active drainage and significant blanchable erythema now with increased warmth and edema.  Previous  pinpoint ulcerations at the previous amputation stump site has healed and have dry scabs to the area.  Vascular: Dorsalis Pedis pulse = 0/4 Bilateral,  Posterior Tibial pulse = 0/4 Bilateral,  Capillary Fill Time < 5 seconds  Neurologic: Protective sensation severely diminished on the left.  Musculosketal: There is significant left foot deformity patient has a Z foot type with a  very large bunion and crossover deformity of the hallux onto the lesser toes with protrusion of the fifth metatarsal base at previous amputation stump site on left as previously noted.  CBC    Component Value Date/Time   WBC 7.2 08/05/2019 0309   RBC 3.83 (L) 08/05/2019 0309   HGB 10.9 (L) 08/05/2019 0309   HCT 33.5 (L) 08/05/2019 0309   PLT 342 08/05/2019 0309   MCV 87.5 08/05/2019 0309   MCH 28.5 08/05/2019 0309   MCHC 32.5 08/05/2019 0309   RDW 12.6 08/05/2019 0309   LYMPHSABS 1.1 08/04/2019 1829   MONOABS 0.8 08/04/2019 1829   EOSABS 0.2 08/04/2019 1829   BASOSABS 0.1 08/04/2019 1829   BMET    Component Value Date/Time   NA 126 (L) 08/05/2019 0309   K 4.7 08/05/2019 0309   CL 96 (L) 08/05/2019 0309   CO2 23 08/05/2019 0309   GLUCOSE 287 (H) 08/05/2019 0309   BUN 16 08/05/2019 0309   CREATININE 1.13 08/05/2019 0309   CALCIUM 8.5 (L) 08/05/2019 0309   GFRNONAA >60 08/05/2019 0309   GFRAA >60 08/05/2019 0309    Assessment and Plan: Problem List Items Addressed This Visit    None    Visit Diagnoses    Foot infection    -  Primary   Relevant Medications   cefTRIAXone (ROCEPHIN) 2 g in sodium chloride 0.9 % 100 mL IVPB (Completed)   vancomycin (VANCOREADY) IVPB 1250 mg/250 mL     Patient seen and evaluated Chart reviewed Discussed case with Dr. Donzetta Matters who confirms good distal blood flow, due to the extent of the infection with 1st metatarsal bone exposure and necrosis that is present discussed with patient the benefits of surgical intervention, Discussed TMA of left foot, all risks and  alternatives explained. No gaurantees given or implied. Discussed with plan with Son over the phone Patient to be NPO after breakfast for OR tomorrow afternoon Continue with IV Abx May weightbear with assistance for bedside and bathroom only Consult appreciated Podiatry to follow  Landis Martins, DPM [facility] [phone] office [phone] cell

## 2019-08-05 NOTE — ED Notes (Signed)
Pt given one cup of water, per admitting MD. Pt to be NPO afterwards.

## 2019-08-05 NOTE — Progress Notes (Signed)
PROGRESS NOTE    Danny Carpenter  NTI:[id number] DOB: 1950-07-26 DOA: 08/04/2019 PCP: Melony Overly, MD   Brief Narrative:   Danny Carpenter is a 69 y.o. male with medical history significant of HTN, HLD, T2DM, PAD s/p, left common femoral and profundofemoral endarterectomy with vein patch angioplasty, and L femoral to above knee popliteal artery bypass graft with reverse ipsilateral translocated saphenous vein 12/2018 and s/p L 5th digit amputation presents with worsening non healing ulcer of L foot. Patient presents for left foot ulcer.  Patient has had having more imbalance last week and 1 week prior did have a fall leading him to the ground with minor soft bump to the head.  No ongoing symptoms since that time no worsening gait ataxia.  No focal deficits.  He presents today with worsening ulcer and erythema with increased drainage from the left foot.  He states the bone has been exposed now for probably at least 3 to 5 days.  He has been bearing weight on this wearing appropriate shoe.  He reports ever since amputation of the left  fifth digit he has had difficulty with his gait. He denies other acute symptoms - no fevers, chills, sweats.  No cough, sob, chest pain.   Assessment & Plan:   Active Problems:   Diabetic foot infection ([place])   Subacute osteomyelitis of left foot (HCC)   PVD (peripheral vascular disease) ([place])   PAD (peripheral artery disease) ([place])   AAA (abdominal aortic aneurysm) without rupture (HCC)   Osteomyelitis (HCC)   Acute L foot osteomyelitis and cellulitis, does not meet sepsis criteria, POA Profound history of PAD Charcot foot, DM foot ulcer and infection - Patient has outpatient X-rays with concern for osteo per review (obtaining records) - Vascular/Duda consulted at admission - Continue empiric coverage with Vanc and Zosyn (diabetic, risk for polymicrobial/anaerobes) - wound care following - continue clopidogrel, rosuvastatin - continue pain meds and  gabapentin, duloxetine  AKI, without history of CKD, resolving - held lisinopril, held NSAID (DOLOBID) - monitor I/O - avoid nephrotoxic agents Lab Results  Component Value Date   CREATININE 1.13 08/05/2019   CREATININE 1.28 (H) 08/04/2019   CREATININE 0.90 06/17/2019    Chronic Diastolic Heart Failure, no acute exacerbation - Echo 12/2018 - on lisinopril, spironolactone, coreg; impaired relaxation but no comment on elevated pressures - carries chart hx  HTN - continued coreg, amlodipine, held lisinopril  HLD - continue fenofibrate and rosuvastatin  T2DM, uncontrolled Lab Results  Component Value Date   HGBA1C 11.6 (H) 08/05/2019  - continue ISS and CBG - held home agents, started lantus but will need titration pending clinical course - diabetic educator to follow  Hyponatremia - sodium corrects to ~132 for hyperglycemia - continue home medications including salt tabs  GERD - continue pantoprazole  BPH - continue tamsulosin  AAA - Follow up out-patient schedule for surveillance per guidelines  Questionable history of seizure disorder - not taking Keppra currently   DVT prophylaxis: Heparin Code Status: Full Family Communication: None present Status is: Patient  Dispo: The patient is from: Home              Anticipated d/c is to: Pending clinical evaluation/post surgical therapy              Anticipated d/c date is: Greater than 3 days              Patient currently not medically stable for discharge  Consultants:   Vascular surgery, Sharol Given  Procedures:   None planned  Antimicrobials:  Vanco/Zosyn 08/05/2019, ongoing  Subjective: No acute issues or events overnight, denies fevers, chills, nausea, vomiting, diarrhea, constipation.  Objective: Vitals:   08/05/19 0730 08/05/19 0745 08/05/19 1051 08/05/19 1100  BP: (!) 157/62 (!) 181/58 (!) 172/64 (!) 139/121  Pulse: 66 74  73  Resp:      Temp:      TempSrc:      SpO2: 98% 100%  98%    Weight:      Height:        Intake/Output Summary (Last 24 hours) at 08/05/2019 1136 Last data filed at 08/05/2019 0300 Gross per 24 hour  Intake 200 ml  Output 1100 ml  Net -900 ml   Filed Weights   08/04/19 1759  Weight: 97.5 kg    Examination:  General:  Pleasantly resting in bed, No acute distress. HEENT:  Normocephalic atraumatic.  Sclerae nonicteric, noninjected.  Extraocular movements intact bilaterally. Neck:  Without mass or deformity.  Trachea is midline. Lungs:  Clear to auscultate bilaterally without rhonchi, wheeze, or rales. Heart:  Regular rate and rhythm.  Without murmurs, rubs, or gallops. Abdomen:  Soft, nontender, nondistended.  Without guarding or rebound. Extremities: Diminished sensation bilaterally distally in both feet, equal pulses peripherally, 3 x 2 cm open weeping lesion in the medial distal metatarsal aspect of the left foot.  Blanching erythema with pitting edema to distal midfoot.  Fifth left toe previous amputation noted. Vascular:  Dorsalis pedis and posterior tibial pulses palpable bilaterally. Skin: As outlined above, otherwise without notable lesion, rash,  Data Reviewed: I have personally reviewed following labs and imaging studies  CBC: Recent Labs  Lab 08/04/19 1829 08/05/19 0309  WBC 8.7 7.2  NEUTROABS 6.4  --   HGB 11.2* 10.9*  HCT 34.5* 33.5*  MCV 88.7 87.5  PLT 329 923   Basic Metabolic Panel: Recent Labs  Lab 08/04/19 1829 08/05/19 0309  NA 125* 126*  K 4.8 4.7  CL 92* 96*  CO2 22 23  GLUCOSE 390* 287*  BUN 21 16  CREATININE 1.28* 1.13  CALCIUM 8.8* 8.5*   GFR: Estimated Creatinine Clearance: 78 mL/min (by C-G formula based on SCr of 1.13 mg/dL). Liver Function Tests: Recent Labs  Lab 08/04/19 1829  AST 66*  ALT 66*  ALKPHOS 93  BILITOT 0.5  PROT 7.2  ALBUMIN 2.5*   No results for input(s): LIPASE, AMYLASE in the last 168 hours. No results for input(s): AMMONIA in the last 168 hours. Coagulation  Profile: No results for input(s): INR, PROTIME in the last 168 hours. Cardiac Enzymes: No results for input(s): CKTOTAL, CKMB, CKMBINDEX, TROPONINI in the last 168 hours. BNP (last 3 results) No results for input(s): PROBNP in the last 8760 hours. HbA1C: Recent Labs    08/05/19 0309  HGBA1C 11.6*   CBG: Recent Labs  Lab 08/05/19 0829 08/05/19 1049  GLUCAP 286* 257*   Lipid Profile: No results for input(s): CHOL, HDL, LDLCALC, TRIG, CHOLHDL, LDLDIRECT in the last 72 hours. Thyroid Function Tests: No results for input(s): TSH, T4TOTAL, FREET4, T3FREE, THYROIDAB in the last 72 hours. Anemia Panel: No results for input(s): VITAMINB12, FOLATE, FERRITIN, TIBC, IRON, RETICCTPCT in the last 72 hours. Sepsis Labs: Recent Labs  Lab 08/04/19 1829 08/04/19 2212  LATICACIDVEN 1.1 1.3    Recent Results (from the past 240 hour(s))  Blood culture (routine x 2)     Status: None (Preliminary result)   Collection Time: 08/04/19  9:59 PM  Specimen: BLOOD  Result Value Ref Range Status   Specimen Description BLOOD RIGHT ANTECUBITAL  Final   Special Requests   Final    BOTTLES DRAWN AEROBIC AND ANAEROBIC Blood Culture adequate volume   Culture   Final    NO GROWTH < 12 HOURS Performed at [facility], [address], [place], [place] [address]    Report Status PENDING  Incomplete  Blood culture (routine x 2)     Status: None (Preliminary result)   Collection Time: 08/04/19 10:09 PM   Specimen: BLOOD  Result Value Ref Range Status   Specimen Description BLOOD RIGHT ANTECUBITAL  Final   Special Requests   Final    AEROBIC BOTTLE ONLY Blood Culture results may not be optimal due to an inadequate volume of blood received in culture bottles   Culture   Final    NO GROWTH < 12 HOURS Performed at [facility], [place] [address] [place], [place] [address]    Report Status PENDING  Incomplete  Respiratory Panel by RT PCR (Flu A&B, Covid) - Nasopharyngeal Swab     Status: None    Collection Time: 08/04/19 11:06 PM   Specimen: Nasopharyngeal Swab  Result Value Ref Range Status   SARS Coronavirus 2 by RT PCR NEGATIVE NEGATIVE Final    Comment: (NOTE) SARS-CoV-2 target nucleic acids are NOT DETECTED. The SARS-CoV-2 RNA is generally detectable in upper respiratoy specimens during the acute phase of infection. The lowest concentration of SARS-CoV-2 viral copies this assay can detect is 131 copies/mL. A negative result does not preclude SARS-Cov-2 infection and should not be used as the sole basis for treatment or other patient management decisions. A negative result may occur with  improper specimen collection/handling, submission of specimen other than nasopharyngeal swab, presence of viral mutation(s) within the areas targeted by this assay, and inadequate number of viral copies (<131 copies/mL). A negative result must be combined with clinical observations, patient history, and epidemiological information. The expected result is Negative. Fact Sheet for Patients:  PinkCheek.be Fact Sheet for Healthcare Providers:  GravelBags.it This test is not yet ap proved or cleared by the [place] FDA and  has been authorized for detection and/or diagnosis of SARS-CoV-2 by FDA under an Emergency Use Authorization (EUA). This EUA will remain  in effect (meaning this test can be used) for the duration of the COVID-19 declaration under Section 564(b)(1) of the Act, 21 U.S.C. section 360bbb-3(b)(1), unless the authorization is terminated or revoked sooner.    Influenza A by PCR NEGATIVE NEGATIVE Final   Influenza B by PCR NEGATIVE NEGATIVE Final    Comment: (NOTE) The Xpert Xpress SARS-CoV-2/FLU/RSV assay is intended as an aid in  the diagnosis of influenza from Nasopharyngeal swab specimens and  should not be used as a sole basis for treatment. Nasal washings and  aspirates are unacceptable for Xpert Xpress  SARS-CoV-2/FLU/RSV  testing. Fact Sheet for Patients: PinkCheek.be Fact Sheet for Healthcare Providers: GravelBags.it This test is not yet approved or cleared by the [place] FDA and  has been authorized for detection and/or diagnosis of SARS-CoV-2 by  FDA under an Emergency Use Authorization (EUA). This EUA will remain  in effect (meaning this test can be used) for the duration of the  Covid-19 declaration under Section 564(b)(1) of the Act, 21  U.S.C. section 360bbb-3(b)(1), unless the authorization is  terminated or revoked. Performed at [facility], [place] [address], [place], [place] [address]  Radiology Studies: No results found.      Scheduled Meds: . acidophilus  1 capsule Oral Daily  . amLODipine  10 mg Oral Daily  . B-complex with vitamin C  1 tablet Oral Daily  . carvedilol  40 mg Oral QHS  . clopidogrel  75 mg Oral Daily  . docusate sodium  100 mg Oral BID  . DULoxetine  60 mg Oral Daily  . fenofibrate  160 mg Oral Daily  . gabapentin  300 mg Oral BID  . heparin  5,000 Units Subcutaneous Q12H  . insulin aspart  0-5 Units Subcutaneous QHS  . insulin aspart  0-9 Units Subcutaneous TID WC  . insulin glargine  5 Units Subcutaneous Daily  . loratadine  10 mg Oral Daily  . morphine  15 mg Oral Q12H  . pantoprazole  40 mg Oral QAC breakfast  . rosuvastatin  20 mg Oral Daily  . sodium chloride flush  3 mL Intravenous Q12H  . sodium chloride  2 g Oral BID AC & HS  . spironolactone  25 mg Oral Daily  . tamsulosin  0.4 mg Oral QHS  . traZODone  50 mg Oral QHS   Continuous Infusions: . piperacillin-tazobactam (ZOSYN)  IV Stopped (08/05/19 0716)  . vancomycin Stopped (08/05/19 0219)     LOS: 0 days   Time spent: 81min  Avani Sensabaugh C Roxanna Mcever, DO [facility]  If 7PM-7AM, please contact night-coverage [email or web address]  08/05/2019, 11:36 AM

## 2019-08-05 NOTE — Progress Notes (Signed)
Patient ID: Danny Carpenter, male   DOB: [birth year], 69 y.o.   MRN: [id number] Patient is seen for evaluation of the left foot with massive necrotic ulcer to the MTP joint of the left foot.  Patient is status post multiple vascular interventions for the left lower extremity with good macro circulation at this time.  Patient has been evaluated by vascular surgery he has good circulation to the foot from his vascular procedures and anticipate transmetatarsal amputation tomorrow with vascular surgery.  I discussed with the patient that I will be available if needed.

## 2019-08-05 NOTE — Progress Notes (Signed)
Pt arrived to unit via stretcher from ED. Alert/oriented in no acute distress. Pt situated/oreintated to room/equipments, Welcome pack/guide/menu provided. No complaints. Bed at lowest position, 3 side rails up, call bell/room phone within reach. All wheels locked. Pt has been informed that facility is not responsible for any losses/damages to any personal belongings such as cellphone, cash, credit cards or any valuables.

## 2019-08-05 NOTE — Progress Notes (Addendum)
Inpatient Diabetes Program Recommendations  AACE/ADA: New Consensus Statement on Inpatient Glycemic Control (2015)  Target Ranges:  Prepandial:   less than 140 mg/dL      Peak postprandial:   less than 180 mg/dL (1-2 hours)      Critically ill patients:  140 - 180 mg/dL   Lab Results  Component Value Date   GLUCAP 286 (H) 08/05/2019   HGBA1C 11.6 (H) 08/05/2019    Review of Glycemic Control Results for JAZIR, NEWEY (MRN [id number]) as of 08/05/2019 09:37  Ref. Range 02/22/2019 11:56 02/22/2019 12:35 06/17/2019 09:29 08/05/2019 08:29  Glucose-Capillary Latest Ref Range: 70 - 99 mg/dL 258 (H) 212 (H) 250 (H) 286 (H)   Diabetes history: Type 2 DM Outpatient Diabetes medications: Novolog 6-8 units BID, Xultophy 22 units QAM Current orders for Inpatient glycemic control: Lantus 5 units QD, Novolog 0-9 units TID, Novolog 0-5 units QHS  Inpatient Diabetes Program Recommendations:    Pt NPO. Consider increasing Lantus to 10 units QD (home dose 22 units). Secure chat sent to MD. Will plan to see patient.  Addendum@[id number] : Spoke with patient regarding outpatient management of diabetes. Confirmed basal dose at home. Patient take Novolog QAM but not with all meals.  Reviewed patient's current A1c of 11.6%. Explained what a A1c is and what it measures. Also reviewed goal A1c with patient, importance of good glucose control @ home, and blood sugar goals. Reviewed patho of DM, need for insulin, post prandial values, target goals, when to call MD, vascular changes and commorbidities.  Patient has a meter and reports checking 2 times per day. Encouraged to check post prandially, as values are most likely higher when not covering meals.  Denies drinking sugary beverages and is mindful of carb intake. Patient is asking about NPO status. Another text page sent to MD. Patient is scheduled to follow up with PCP next week. Encouraged to take meter and values and reviewed questions to ask and goals. Has no further  questions at this time.   Thanks, Bronson Curb, MSN, RNC-OB Diabetes Coordinator [phone] (8a-5p)

## 2019-08-05 NOTE — ED Notes (Signed)
RN removed cups of water from pt room. Pt is NPO. Pt repositioned in bed and clothes removed and placed in pt belonging bag.

## 2019-08-05 NOTE — Discharge Instructions (Signed)
Hypoglycemia °Hypoglycemia is when the sugar (glucose) level in your blood is too low. Signs of low blood sugar may include: °· Feeling: °? Hungry. °? Worried or nervous (anxious). °? Sweaty and clammy. °? Confused. °? Dizzy. °? Sleepy. °? Sick to your stomach (nauseous). °· Having: °? A fast heartbeat. °? A headache. °? A change in your vision. °? Tingling or no feeling (numbness) around your mouth, lips, or tongue. °? Jerky movements that you cannot control (seizure). °· Having trouble with: °? Moving (coordination). °? Sleeping. °? Passing out (fainting). °? Getting upset easily (irritability). °Low blood sugar can happen to people who have diabetes and people who do not have diabetes. Low blood sugar can happen quickly, and it can be an emergency. °Treating low blood sugar °Low blood sugar is often treated by eating or drinking something sugary right away, such as: °· Fruit juice, 4-6 oz (120-150 mL). °· Regular soda (not diet soda), 4-6 oz (120-150 mL). °· Low-fat milk, 4 oz (120 mL). °· Several pieces of hard candy. °· Sugar or honey, 1 Tbsp (15 mL). °Treating low blood sugar if you have diabetes °If you can think clearly and swallow safely, follow the 15:15 rule: °· Take 15 grams of a fast-acting carb (carbohydrate). Talk with your doctor about how much you should take. °· Always keep a source of fast-acting carb with you, such as: °? Sugar tablets (glucose pills). Take 3-4 pills. °? 6-8 pieces of hard candy. °? 4-6 oz (120-150 mL) of fruit juice. °? 4-6 oz (120-150 mL) of regular (not diet) soda. °? 1 Tbsp (15 mL) honey or sugar. °· Check your blood sugar 15 minutes after you take the carb. °· If your blood sugar is still at or below 70 mg/dL (3.9 mmol/L), take 15 grams of a carb again. °· If your blood sugar does not go above 70 mg/dL (3.9 mmol/L) after 3 tries, get help right away. °· After your blood sugar goes back to normal, eat a meal or a snack within 1 hour. ° °Treating very low blood sugar °If your  blood sugar is at or below 54 mg/dL (3 mmol/L), you have very low blood sugar (severe hypoglycemia). This may also cause: °· Passing out. °· Jerky movements you cannot control (seizure). °· Losing consciousness (coma). °This is an emergency. Do not wait to see if the symptoms will go away. Get medical help right away. Call your local emergency services (911 in the [place] Do not drive yourself to the hospital. °If you have very low blood sugar and you cannot eat or drink, you may need a glucagon shot (injection). A family member or friend should learn how to check your blood sugar and how to give you a glucagon shot. Ask your doctor if you need to have a glucagon shot kit at home. °Follow these instructions at home: °General instructions °· Take over-the-counter and prescription medicines only as told by your doctor. °· Stay aware of your blood sugar as told by your doctor. °· Limit alcohol intake to no more than 1 drink a day for nonpregnant women and 2 drinks a day for men. One drink equals 12 oz of beer (355 mL), 5 oz of wine (148 mL), or 1½ oz of hard liquor (44 mL). °· Keep all follow-up visits as told by your doctor. This is important. °If you have diabetes: ° °· Follow your diabetes care plan as told by your doctor. Make sure you: °? Know the signs of low blood sugar. °?   Take your medicines as told. ? Follow your exercise and meal plan. ? Eat on time. Do not skip meals. ? Check your blood sugar as often as told by your doctor. Always check it before and after exercise. ? Follow your sick day plan when you cannot eat or drink normally. Make this plan ahead of time with your doctor.  Share your diabetes care plan with: ? Your work or school. ? People you live with.  Check your pee (urine) for ketones: ? When you are sick. ? As told by your doctor.  Carry a card or wear jewelry that says you have diabetes. Contact a doctor if:  You have trouble keeping your blood sugar in your target  range.  You have low blood sugar often. Get help right away if:  You still have symptoms after you eat or drink something sugary.  Your blood sugar is at or below 54 mg/dL (3 mmol/L).  You have jerky movements that you cannot control.  You pass out. These symptoms may be an emergency. Do not wait to see if the symptoms will go away. Get medical help right away. Call your local emergency services (911 in the [place] Do not drive yourself to the hospital. Summary  Hypoglycemia happens when the level of sugar (glucose) in your blood is too low.  Low blood sugar can happen to people who have diabetes and people who do not have diabetes. Low blood sugar can happen quickly, and it can be an emergency.  Make sure you know the signs of low blood sugar and know how to treat it.  Always keep a source of sugar (fast-acting carb) with you to treat low blood sugar. This information is not intended to replace advice given to you by your health care provider. Make sure you discuss any questions you have with your health care provider. Document Revised: 07/09/2018 Document Reviewed: 04/21/2015 Elsevier Patient Education  [place]. Hyperglycemia Hyperglycemia occurs when the level of sugar (glucose) in the blood is too high. Glucose is a type of sugar that provides the body's main source of energy. Certain hormones (insulin and glucagon) control the level of glucose in the blood. Insulin lowers blood glucose, and glucagon increases blood glucose. Hyperglycemia can result from having too little insulin in the bloodstream, or from the body not responding normally to insulin. Hyperglycemia occurs most often in people who have diabetes (diabetes mellitus), but it can happen in people who do not have diabetes. It can develop quickly, and it can be life-threatening if it causes you to become severely dehydrated (diabetic ketoacidosis or hyperglycemic hyperosmolar state). Severe hyperglycemia is a  medical emergency. What are the causes? If you have diabetes, hyperglycemia may be caused by:  Diabetes medicine.  Medicines that increase blood glucose or affect your diabetes control.  Not eating enough, or not eating often enough.  Changes in physical activity level.  Being sick or having an infection. If you have prediabetes or undiagnosed diabetes:  Hyperglycemia may be caused by those conditions. If you do not have diabetes, hyperglycemia may be caused by:  Certain medicines, including steroid medicines, beta-blockers, epinephrine, and thiazide diuretics.  Stress.  Serious illness.  Surgery.  Diseases of the pancreas.  Infection. What increases the risk? Hyperglycemia is more likely to develop in people who have risk factors for diabetes, such as:  Having a family member with diabetes.  Having a gene for type 1 diabetes that is passed from parent to child (inherited).  Living in an area with cold weather conditions.  Exposure to certain viruses.  Certain conditions in which the body's disease-fighting (immune) system attacks itself (autoimmune disorders).  Being overweight or obese.  Having an inactive (sedentary) lifestyle.  Having been diagnosed with insulin resistance.  Having a history of prediabetes, gestational diabetes, or polycystic ovarian syndrome (PCOS).  Being of American-Indian, African-American, Hispanic/Latino, or Asian/Pacific Islander descent. What are the signs or symptoms? Hyperglycemia may not cause any symptoms. If you do have symptoms, they may include early warning signs, such as:  Increased thirst.  Hunger.  Feeling very tired.  Needing to urinate more often than usual.  Blurry vision. Other symptoms may develop if hyperglycemia gets worse, such as:  Dry mouth.  Loss of appetite.  Fruity-smelling breath.  Weakness.  Unexpected or rapid weight gain or weight loss.  Tingling or numbness in the hands or  feet.  Headache.  Skin that does not quickly return to normal after being lightly pinched and released (poor skin turgor).  Abdominal pain.  Cuts or bruises that are slow to heal. How is this diagnosed? Hyperglycemia is diagnosed with a blood test to measure your blood glucose level. This blood test is usually done while you are having symptoms. Your health care provider may also do a physical exam and review your medical history. You may have more tests to determine the cause of your hyperglycemia, such as:  A fasting blood glucose (FBG) test. You will not be allowed to eat (you will fast) for at least 8 hours before a blood sample is taken.  An A1c (hemoglobin A1c) blood test. This provides information about blood glucose control over the previous 2-3 months.  An oral glucose tolerance test (OGTT). This measures your blood glucose at two times: ? After fasting. This is your baseline blood glucose level. ? Two hours after drinking a beverage that contains glucose. How is this treated? Treatment depends on the cause of your hyperglycemia. Treatment may include:  Taking medicine to regulate your blood glucose levels. If you take insulin or other diabetes medicines, your medicine or dosage may be adjusted.  Lifestyle changes, such as exercising more, eating healthier foods, or losing weight.  Treating an illness or infection, if this caused your hyperglycemia.  Checking your blood glucose more often.  Stopping or reducing steroid medicines, if these caused your hyperglycemia. If your hyperglycemia becomes severe and it results in hyperglycemic hyperosmolar state, you must be hospitalized and given IV fluids. Follow these instructions at home:  General instructions  Take over-the-counter and prescription medicines only as told by your health care provider.  Do not use any products that contain nicotine or tobacco, such as cigarettes and e-cigarettes. If you need help quitting, ask  your health care provider.  Limit alcohol intake to no more than 1 drink per day for nonpregnant women and 2 drinks per day for men. One drink equals 12 oz of beer, 5 oz of wine, or 1 oz of hard liquor.  Learn to manage stress. If you need help with this, ask your health care provider.  Keep all follow-up visits as told by your health care provider. This is important. Eating and drinking   Maintain a healthy weight.  Exercise regularly, as directed by your health care provider.  Stay hydrated, especially when you exercise, get sick, or spend time in hot temperatures.  Eat healthy foods, such as: ? Lean proteins. ? Complex carbohydrates. ? Fresh fruits and vegetables. ? Low-fat dairy products. ?  Healthy fats.  Drink enough fluid to keep your urine clear or pale yellow. If you have diabetes:  Make sure you know the symptoms of hyperglycemia.  Follow your diabetes management plan, as told by your health care provider. Make sure you: ? Take your insulin and medicines as directed. ? Follow your exercise plan. ? Follow your meal plan. Eat on time, and do not skip meals. ? Check your blood glucose as often as directed. Make sure to check your blood glucose before and after exercise. If you exercise longer or in a different way than usual, check your blood glucose more often. ? Follow your sick day plan whenever you cannot eat or drink normally. Make this plan in advance with your health care provider.  Share your diabetes management plan with people in your workplace, school, and household.  Check your urine for ketones when you are ill and as told by your health care provider.  Carry a medical alert card or wear medical alert jewelry. Contact a health care provider if:  Your blood glucose is at or above 240 mg/dL (13.3 mmol/L) for 2 days in a row.  You have problems keeping your blood glucose in your target range.  You have frequent episodes of hyperglycemia. Get help right  away if:  You have difficulty breathing.  You have a change in how you think, feel, or act (mental status).  You have nausea or vomiting that does not go away. These symptoms may represent a serious problem that is an emergency. Do not wait to see if the symptoms will go away. Get medical help right away. Call your local emergency services (911 in the [place] Do not drive yourself to the hospital. Summary  Hyperglycemia occurs when the level of sugar (glucose) in the blood is too high.  Hyperglycemia is diagnosed with a blood test to measure your blood glucose level. This blood test is usually done while you are having symptoms. Your health care provider may also do a physical exam and review your medical history.  If you have diabetes, follow your diabetes management plan as told by your health care provider.  Contact your health care provider if you have problems keeping your blood glucose in your target range. This information is not intended to replace advice given to you by your health care provider. Make sure you discuss any questions you have with your health care provider. Document Revised: 12/04/2015 Document Reviewed: 12/04/2015 Elsevier Patient Education  [place]. Hemoglobin A1c Test Why am I having this test? You may have the hemoglobin A1c test (HbA1c test) done to:  Evaluate your risk for developing diabetes (diabetes mellitus).  Diagnose diabetes.  Monitor long-term control of blood sugar (glucose) in people who have diabetes and help make treatment decisions. This test may be done with other blood glucose tests, such as fasting blood glucose and oral glucose tolerance tests. What is being tested? Hemoglobin is a type of protein in the blood that carries oxygen. Glucose attaches to hemoglobin to form glycated hemoglobin. This test checks the amount of glycated hemoglobin in your blood, which is a good indicator of the average amount of glucose in your blood  during the past 2-3 months. What kind of sample is taken?  A blood sample is required for this test. It is usually collected by inserting a needle into a blood vessel. Tell a health care provider about:  All medicines you are taking, including vitamins, herbs, eye drops, creams, and over-the-counter medicines.  Any blood disorders you have.  Any surgeries you have had.  Any medical conditions you have.  Whether you are pregnant or may be pregnant. How are the results reported? Your results will be reported as a percentage that indicates how much of your hemoglobin has glucose attached to it (is glycated). Your health care provider will compare your results to normal ranges that were established after testing a large group of people (reference ranges). Reference ranges may vary among labs and hospitals. For this test, common reference ranges are:  Adult or child without diabetes: 4-5.6%.  Adult or child with diabetes and good blood glucose control: less than 7%. What do the results mean? If you have diabetes:  A result of less than 7% is considered normal, meaning that your blood glucose is well controlled.  A result higher than 7% means that your blood glucose is not well controlled, and your treatment plan may need to be adjusted. If you do not have diabetes:  A result within the reference range is considered normal, meaning that you are not at high risk for diabetes.  A result of 5.7-6.4% means that you have a high risk of developing diabetes, and you may have prediabetes. Prediabetes is the condition of having a blood glucose level that is higher than it should be, but not high enough for you to be diagnosed with diabetes. Having prediabetes puts you at risk for developing type 2 diabetes (type 2 diabetes mellitus). You may have more tests, including a repeat HbA1c test.  Results of 6.5% or higher on two separate HbA1c tests mean that you have diabetes. You may have more tests to  confirm the diagnosis. Abnormally low HbA1c values may be caused by:  Pregnancy.  Severe blood loss.  Receiving donated blood (transfusions).  Low red blood cell count (anemia).  Long-term kidney failure.  Some unusual forms (variants) of hemoglobin. Talk with your health care provider about what your results mean. Questions to ask your health care provider Ask your health care provider, or the department that is doing the test:  When will my results be ready?  How will I get my results?  What are my treatment options?  What other tests do I need?  What are my next steps? Summary  The hemoglobin A1c test (HbA1c test) may be done to evaluate your risk for developing diabetes, to diagnose diabetes, and to monitor long-term control of blood sugar (glucose) in people who have diabetes and help make treatment decisions.  Hemoglobin is a type of protein in the blood that carries oxygen. Glucose attaches to hemoglobin to form glycated hemoglobin. This test checks the amount of glycated hemoglobin in your blood, which is a good indicator of the average amount of glucose in your blood during the past 2-3 months.  Talk with your health care provider about what your results mean. This information is not intended to replace advice given to you by your health care provider. Make sure you discuss any questions you have with your health care provider. Document Revised: 02/28/2017 Document Reviewed: 10/29/2016 Elsevier Patient Education  [place].

## 2019-08-05 NOTE — Consult Note (Addendum)
Hospital Consult    Reason for Consult:  Foot wound s/p bypass Referring Physician:  Dr. Avon Gully MRN #:  [id number]  History of Present Illness: This is a 69 y.o. male with a history of left common femoral endarterectomy with vein patch angioplasty and left femoral to above-knee popliteal artery bypass with reversed ipsilateral vein in October 2020 by Dr. Trula Slade.  This was done for left foot ulceration.  He is subsequently been followed by podiatry with a left fifth ray amputation.  On March 18 he underwent angiography with Dr. Carlis Abbott demonstrated 2 areas of stenosis in his bypass and these were angioplastied.  He now presents with worsening wound of the left foot.  He was evaluated by Dr. Cannon Kettle yesterday and sent to [facility] ED for evaluation. He takes plavix and crestor   Past Medical History:  Diagnosis Date  . AAA (abdominal aortic aneurysm) ([place])   . Anxiety   . Arthritis   . CHF (congestive heart failure) ([place])   . COPD (chronic obstructive pulmonary disease) ([place])   . CVA (cerebral vascular accident) ([place])   . Depression   . DM2 (diabetes mellitus, type 2) ([place])   . Fracture of rib    Left  . GERD (gastroesophageal reflux disease)   . HTN (hypertension)   . Hypercholesteremia   . Myocardial infarction ([place])    "light" heart attack 15 years ago (per pt 01/26/19)  . Peripheral vascular disease ([place])   . PONV (postoperative nausea and vomiting)   . Seizure [facility])    one time in Oct. 2020  . Sleep apnea    no cpap    Past Surgical History:  Procedure Laterality Date  . ABDOMINAL AORTOGRAM W/LOWER EXTREMITY N/A 01/19/2019   Procedure: ABDOMINAL AORTOGRAM W/LOWER EXTREMITY;  Surgeon: Serafina Mitchell, MD;  Location: [place] [facility];  Service: Cardiovascular;  Laterality: N/A;  . ABDOMINAL AORTOGRAM W/LOWER EXTREMITY Left 06/17/2019   Procedure: ABDOMINAL AORTOGRAM W/LOWER EXTREMITY;  Surgeon: Marty Heck, MD;  Location: [place] [facility];  Service: Cardiovascular;   Laterality: Left;  . AMPUTATION Left 02/22/2019   Procedure: LEFT FOOT 5TH RAY AMPUTATION;  Surgeon: Newt Minion, MD;  Location: [place];  Service: Orthopedics;  Laterality: Left;  . APPENDECTOMY    . ENDARTERECTOMY FEMORAL Left 01/27/2019   Procedure: ENDARTERECTOMY LEFT FEMORAL ARTERY AND PROFUNDA  ARTERY;  Surgeon: Serafina Mitchell, MD;  Location: [place];  Service: Vascular;  Laterality: Left;  . FEMORAL-POPLITEAL BYPASS GRAFT Left 01/27/2019   Procedure: BYPASS GRAFT FEMORAL-POPLITEAL ARTERY;  Surgeon: Serafina Mitchell, MD;  Location: [place];  Service: Vascular;  Laterality: Left;  . FINGER AMPUTATION     Left second and third fingers, work accident  . KNEE SURGERY Left   . LOWER EXTREMITY ANGIOGRAM  05/2008   Left SFA atherectomy with diamondback 2.25 utilizing distal protection device [id number] placed in the left popliteal artery. Angioplasty of the left SFA with ATV balloon 6 x 40   . PATCH ANGIOPLASTY Left 01/27/2019   Procedure: Patch Angioplasty using Vein;  Surgeon: Serafina Mitchell, MD;  Location: [place];  Service: Vascular;  Laterality: Left;  . PERIPHERAL VASCULAR BALLOON ANGIOPLASTY Left 06/17/2019   Procedure: PERIPHERAL VASCULAR BALLOON ANGIOPLASTY;  Surgeon: Marty Heck, MD;  Location: [place] [facility];  Service: Cardiovascular;  Laterality: Left;  left proximal and mid bypass  . SHOULDER SURGERY Right   . VEIN HARVEST Left 01/27/2019   Procedure: Left Saphenous Vein Harvest;  Surgeon:  Serafina Mitchell, MD;  Location: [place];  Service: Vascular;  Laterality: Left;    No Known Allergies  Prior to Admission medications   Medication Sig Start Date End Date Taking? Authorizing Provider  amLODipine (NORVASC) 10 MG tablet Take 1 tablet (10 mg total) by mouth daily. 01/21/19  Yes Masoudi, Elhamalsadat, MD  pantoprazole (PROTONIX) 40 MG tablet Take 40 mg by mouth daily before breakfast.   Yes [provider]  carvedilol (COREG CR) 40 MG 24 hr capsule Take 40 mg by  mouth at bedtime.    [provider]  clopidogrel (PLAVIX) 75 MG tablet Take 75 mg by mouth daily.    [provider]  diflunisal (DOLOBID) 500 MG TABS tablet Take 500 mg by mouth 2 (two) times daily as needed (for pain management- take with food or milk).    [provider]  DULoxetine (CYMBALTA) 60 MG capsule Take 60 mg by mouth daily.    [provider]  fenofibrate 160 MG tablet Take 160 mg by mouth daily.    [provider]  fexofenadine (ALLEGRA) 180 MG tablet Take 180 mg by mouth daily as needed for allergies or rhinitis.    [provider]  Gabapentin, Once-Daily, (GRALISE) 600 MG TABS Take 1,200 mg by mouth at bedtime.    [provider]  hydroxypropyl methylcellulose / hypromellose (ISOPTO TEARS / GONIOVISC) 2.5 % ophthalmic solution Place 1 drop into both eyes daily as needed for dry eyes.    [provider]  insulin aspart (NOVOLOG FLEXPEN) 100 UNIT/ML FlexPen Inject 6-8 Units into the skin See admin instructions. Inject 6-8 units into the skin two times a day (morning and early afternoon) as needed, per sliding scale: BGL 300-350 = 6 units; 351-400 = 8 units; >400 = CALL [facility]    [provider]  Insulin Degludec-Liraglutide (XULTOPHY) 100-3.6 UNIT-MG/ML SOPN Inject 22 Units into the skin daily before breakfast.     [provider]  levETIRAcetam (KEPPRA) 500 MG tablet Take 1 tablet (500 mg total) by mouth 2 (two) times daily. 01/20/19   Masoudi, Elhamalsadat, MD  linezolid (ZYVOX) 600 MG tablet Take 600 mg by mouth 2 (two) times daily.    [provider]  lisinopril (ZESTRIL) 40 MG tablet Take 40 mg by mouth daily.    [provider]  morphine (MS CONTIN) 15 MG 12 hr tablet Take 15 mg by mouth every 12 (twelve) hours.    [provider]  Probiotic Product (ALIGN) 4 MG CAPS Take 4 mg by mouth daily.    [provider]  riboflavin (VITAMIN B-2) 100 MG TABS tablet  Take 100 mg by mouth daily.    [provider]  rosuvastatin (CRESTOR) 20 MG tablet Take 20 mg by mouth daily.    [provider]  sodium chloride 1 g tablet Take 2 g by mouth See admin instructions. Take 2 grams by mouth in the morning and 2 grams at bedtime    [provider]  spironolactone (ALDACTONE) 25 MG tablet Take 25 mg by mouth daily.    [provider]  sulfamethoxazole-trimethoprim (BACTRIM DS) 800-160 MG tablet Take 1 tablet by mouth 2 (two) times daily. 07/30/19   Landis Martins, DPM  tamsulosin (FLOMAX) 0.4 MG CAPS capsule Take 0.4 mg by mouth at bedtime.    [provider]  traZODone (DESYREL) 100 MG tablet Take 100 mg by mouth at bedtime.    [provider]    Social History  Socioeconomic History  . Marital status: Married    Spouse name: Not on file  . Number of children: Not on file  . Years of education: Not on file  . Highest education level: Not on file  Occupational History  . Occupation: Disabled Administrator  Tobacco Use  . Smoking status: Former Smoker    Packs/day: 1.00    Years: 40.00    Pack years: 40.00    Types: Cigarettes    Quit date: 04/01/2009    Years since quitting: 10.3  . Smokeless tobacco: Never Used  Substance and Sexual Activity  . Alcohol use: Not Currently  . Drug use: Never  . Sexual activity: Not on file  Other Topics Concern  . Not on file  Social History Narrative   Lives with wife in [place]   Social Determinants of Health   Financial Resource Strain:   . Difficulty of Paying Living Expenses:   Food Insecurity:   . Worried About Charity fundraiser in the Last Year:   . Arboriculturist in the Last Year:   Transportation Needs:   . Film/video editor (Medical):   Marland Kitchen Lack of Transportation (Non-Medical):   Physical Activity:   . Days of Exercise per Week:   . Minutes of Exercise per Session:   Stress:   . Feeling of Stress :   Social Connections:   .  Frequency of Communication with Friends and Family:   . Frequency of Social Gatherings with Friends and Family:   . Attends Religious Services:   . Active Member of Clubs or Organizations:   . Attends Archivist Meetings:   Marland Kitchen Marital Status:   Intimate Partner Violence:   . Fear of Current or Ex-Partner:   . Emotionally Abused:   Marland Kitchen Physically Abused:   . Sexually Abused:      No family history on file.  ROS:  Cardiovascular: []  chest pain/pressure []  palpitations []  SOB lying flat []  DOE []  pain in legs while walking []  pain in legs at rest []  pain in legs at night [x]  non-healing ulcers []  hx of DVT []  swelling in legs  Pulmonary: []  productive cough []  asthma/wheezing []  home O2  Neurologic: []  weakness in []  arms []  legs []  numbness in []  arms []  legs []  hx of CVA []  mini stroke [] difficulty speaking or slurred speech []  temporary loss of vision in one eye []  dizziness  Hematologic: []  hx of cancer []  bleeding problems []  problems with blood clotting easily  Endocrine:   [x]  diabetes []  thyroid disease  GI []  vomiting blood []  blood in stool  GU: []  CKD/renal failure []  HD--[]  M/W/F or []  T/T/S []  burning with urination []  blood in urine  Psychiatric: []  anxiety []  depression  Musculoskeletal: []  arthritis []  joint pain  Integumentary: []  rashes []  ulcers  Constitutional: []  fever []  chills   Physical Examination  Vitals:   08/05/19 0745 08/05/19 1051  BP: (!) 181/58 (!) 172/64  Pulse: 74   Resp:    Temp:    SpO2: 100%    Body mass index is 28.37 kg/m.  General:  nad HENT: WNL, normocephalic Pulmonary: normal non-labored breathing Cardiac: palpable left popliteal pulse Left dp/pt signals Abdomen: soft, NT/ND, no masses Extremities: purulence draining from left 1st metatarsal head laterally with likely bone exposed Neurologic: A&O X 3   CBC    Component Value Date/Time   WBC 7.2 08/05/2019 0309   RBC 3.83  (L)  08/05/2019 0309   HGB 10.9 (L) 08/05/2019 0309   HCT 33.5 (L) 08/05/2019 0309   PLT 342 08/05/2019 0309   MCV 87.5 08/05/2019 0309   MCH 28.5 08/05/2019 0309   MCHC 32.5 08/05/2019 0309   RDW 12.6 08/05/2019 0309   LYMPHSABS 1.1 08/04/2019 1829   MONOABS 0.8 08/04/2019 1829   EOSABS 0.2 08/04/2019 1829   BASOSABS 0.1 08/04/2019 1829    BMET    Component Value Date/Time   NA 126 (L) 08/05/2019 0309   K 4.7 08/05/2019 0309   CL 96 (L) 08/05/2019 0309   CO2 23 08/05/2019 0309   GLUCOSE 287 (H) 08/05/2019 0309   BUN 16 08/05/2019 0309   CREATININE 1.13 08/05/2019 0309   CALCIUM 8.5 (L) 08/05/2019 0309   GFRNONAA >60 08/05/2019 0309   GFRAA >60 08/05/2019 0309    COAGS: Lab Results  Component Value Date   INR 1.0 01/27/2019     Vascular Imaging:   I reviewed his previous angiogram  ASSESSMENT/PLAN: This is a 69 y.o. male with history of left femoral to popliteal artery bypass with vein subsequent left fifth toe amputation.  He now has purulence draining from the first metatarsal head and is planned for left transmetatarsal amputation with Dr. Cannon Kettle.  He has very strong signals at his foot and a palpable popliteal pulse to suggest that his bypass is patent and I suspect his tibial pulses may be palpable were it not for edema.  Either way I think he has blood flow to heal transmetatarsal amputation without further intervention.  I will notify Dr. Trula Slade of the patient's admission.  Brandon C. Donzetta Matters, MD [facility] Office: [phone] Pager: [phone]

## 2019-08-05 NOTE — Evaluation (Signed)
Physical Therapy Evaluation Patient Details Name: Danny Carpenter MRN: [id number] DOB: 1950-06-23 Today's Date: 08/05/2019   History of Present Illness  Pt is 69 y.o. male with medical history significant of HTN, HLD, T2DM, PAD s/p, left common femoral and profundofemoral endarterectomy with vein patch angioplasty, and L femoral to above knee popliteal artery bypass graft with reverse ipsilateral translocated saphenous vein 12/2018 and s/p L 5th digit amputation presents with worsening non healing ulcer of L foot. Admitted with L foot osteomyelitis/cellulitis with vascular and ortho consulted for possible surgery.  Clinical Impression  Pt admitted with above diagnosis. Pt reporting likely surgery/amputation needed(but no notes in chart at this time, however, pt is NPO).  PT performed evaluation to assess current mobility and educate on safe transfers.  Educated on and demonstrated mobility with TDWB on L LE and RW in preps for possible weight bearing restrictions post op and due to current wound status.   At this time recommend pt able to transfer with nursing assist of 1 with RW and limited weight on L LE - recommend transfers to [facility] (not bathroom) at this time due to osteomyelitis/wound/and possibility of surgery.  Pt currently with functional limitations due to the deficits listed below (see PT Problem List). Pt will benefit from skilled PT to increase their independence and safety with mobility to allow discharge to the venue listed below.       Follow Up Recommendations Home health PT;Supervision - Intermittent(further assessment after sx)    Equipment Recommendations  Rolling walker with 5" wheels    Recommendations for Other Services       Precautions / Restrictions Precautions Precautions: Fall Precaution Comments: Limit weight on L LE -in prep for possible surgery/future WBing restrictions (no formal WBing restrictions at this time)      Mobility  Bed Mobility Overal bed mobility:  Needs Assistance Bed Mobility: Supine to Sit;Sit to Supine     Supine to sit: Min guard Sit to supine: Min guard   General bed mobility comments: min guard for safety  Transfers Overall transfer level: Needs assistance Equipment used: Rolling walker (2 wheeled) Transfers: Sit to/from Stand Sit to Stand: Min guard         General transfer comment: min guard safety; cued to limit weight on L LE  Ambulation/Gait Ambulation/Gait assistance: Min guard Gait Distance (Feet): 3 Feet Assistive device: Rolling walker (2 wheeled) Gait Pattern/deviations: Step-to pattern Gait velocity: decreased   General Gait Details: cued to limit weight on L LE; only took side steps up HOB due to limiting ambulation on L LE; pt maintained balance well and did well with RW use with cues  Stairs            Wheelchair Mobility    Modified Rankin (Stroke Patients Only)       Balance Overall balance assessment: Needs assistance Sitting-balance support: No upper extremity supported;Feet supported Sitting balance-Leahy Scale: Good     Standing balance support: No upper extremity supported Standing balance-Leahy Scale: Good                               Pertinent Vitals/Pain Pain Assessment: 0-10 Pain Score: 8  Pain Location: generalized Pain Descriptors / Indicators: Aching Pain Intervention(s): Limited activity within patient's tolerance;Monitored during session;Repositioned;Relaxation    Home Living Family/patient expects to be discharged to:: Private residence Living Arrangements: Spouse/significant other(wife) Available Help at Discharge: Family;Available 24 hours/day(wife and son (wife goes to [facility]))  Type of Home: House Home Access: Ramped entrance     Home Layout: One level Home Equipment: [place] - 4 wheels;Cane - single point;Bedside commode;Shower seat;Grab bars - toilet;Hand held shower head;Wheelchair - manual Additional Comments: has Transport planner     Prior Function Level of Independence: Independent with assistive device(s);Needs assistance   Gait / Transfers Assistance Needed: could ambulate in community with rollator  ADL's / Homemaking Assistance Needed: Independent with ADLs and IADLs  Comments: Pt goes to [facility] day week     Hand Dominance        Extremity/Trunk Assessment   Upper Extremity Assessment Upper Extremity Assessment: Overall WFL for tasks assessed    Lower Extremity Assessment Lower Extremity Assessment: LLE deficits/detail;RLE deficits/detail RLE Deficits / Details: WFL LLE Deficits / Details: All WFL except L ankle not tested due to severe edema/erythema/wounds LLE Sensation: history of peripheral neuropathy       Communication   Communication: No difficulties  Cognition Arousal/Alertness: Awake/alert Behavior During Therapy: WFL for tasks assessed/performed Overall Cognitive Status: Within Functional Limits for tasks assessed                                        General Comments General comments (skin integrity, edema, etc.): VSS    Exercises     Assessment/Plan    PT Assessment Patient needs continued PT services  PT Problem List Decreased strength;Decreased mobility;Decreased range of motion;Decreased knowledge of precautions;Decreased activity tolerance;Decreased balance;Decreased knowledge of use of DME       PT Treatment Interventions DME instruction;Therapeutic activities;Gait training;Therapeutic exercise;Patient/family education;Balance training;Functional mobility training    PT Goals (Current goals can be found in the Care Plan section)  Acute Rehab PT Goals Patient Stated Goal: return home after sx PT Goal Formulation: With patient Time For Goal Achievement: 08/19/19 Potential to Achieve Goals: Good    Frequency Min 3X/week   Barriers to discharge        Co-evaluation               AM-PAC PT "6 Clicks" Mobility  Outcome  Measure Help needed turning from your back to your side while in a flat bed without using bedrails?: None Help needed moving from lying on your back to sitting on the side of a flat bed without using bedrails?: None Help needed moving to and from a bed to a chair (including a wheelchair)?: None Help needed standing up from a chair using your arms (e.g., wheelchair or bedside chair)?: None Help needed to walk in hospital room?: A Little Help needed climbing 3-5 steps with a railing? : A Little 6 Click Score: 22    End of Session Equipment Utilized During Treatment: Gait belt Activity Tolerance: Patient tolerated treatment well Patient left: in bed;with call bell/phone within reach Nurse Communication: Mobility status PT Visit Diagnosis: Unsteadiness on feet (R26.81);Other abnormalities of gait and mobility (R26.89)    Time: 1300-1326 PT Time Calculation (min) (ACUTE ONLY): 26 min   Charges:   PT Evaluation $PT Eval Moderate Complexity: 1 Mod          Maggie Font, PT Acute Rehab Services Pager [phone] [facility] [phone] [facility] [phone]   Karlton Lemon 08/05/2019, 2:06 PM

## 2019-08-06 ENCOUNTER — Encounter (HOSPITAL_COMMUNITY)
Admission: EM | Disposition: A | Discharge status: Home Health | Payer: Self-pay | Source: Home / Self Care | Attending: Internal Medicine

## 2019-08-06 ENCOUNTER — Inpatient Hospital Stay (HOSPITAL_COMMUNITY): Payer: Medicare Other | Admitting: Anesthesiology

## 2019-08-06 ENCOUNTER — Inpatient Hospital Stay (HOSPITAL_COMMUNITY): Payer: Medicare Other

## 2019-08-06 DIAGNOSIS — M86672 Other chronic osteomyelitis, left ankle and foot: Secondary | ICD-10-CM

## 2019-08-06 HISTORY — PX: TRANSMETATARSAL AMPUTATION: SHX6197

## 2019-08-06 LAB — COMPREHENSIVE METABOLIC PANEL
ALT: 54 U/L — ABNORMAL HIGH (ref 0–44)
AST: 47 U/L — ABNORMAL HIGH (ref 15–41)
Albumin: 2.3 g/dL — ABNORMAL LOW (ref 3.5–5.0)
Alkaline Phosphatase: 84 U/L (ref 38–126)
Anion gap: 10 (ref 5–15)
BUN: 13 mg/dL (ref 8–23)
CO2: 22 mmol/L (ref 22–32)
Calcium: 8.7 mg/dL — ABNORMAL LOW (ref 8.9–10.3)
Chloride: 100 mmol/L (ref 98–111)
Creatinine, Ser: 0.94 mg/dL (ref 0.61–1.24)
GFR calc Af Amer: >60 mL/min (ref >60)
GFR calc non Af Amer: >60 mL/min (ref >60)
Glucose, Bld: 190 mg/dL — ABNORMAL HIGH (ref 70–99)
Potassium: 4.4 mmol/L (ref 3.5–5.1)
Sodium: 132 mmol/L — ABNORMAL LOW (ref 135–145)
Total Bilirubin: 0.7 mg/dL (ref 0.3–1.2)
Total Protein: 6.8 g/dL (ref 6.5–8.1)

## 2019-08-06 LAB — GLUCOSE, CAPILLARY
Glucose-Capillary: 315 mg/dL — ABNORMAL HIGH (ref 70–99)
Glucose-Capillary: 328 mg/dL — ABNORMAL HIGH (ref 70–99)
Glucose-Capillary: 171 mg/dL — ABNORMAL HIGH (ref 70–99)
Glucose-Capillary: 167 mg/dL — ABNORMAL HIGH (ref 70–99)

## 2019-08-06 LAB — CBC
HCT: 33.4 % — ABNORMAL LOW (ref 39.0–52.0)
Hemoglobin: 10.8 g/dL — ABNORMAL LOW (ref 13.0–17.0)
MCH: 28.3 pg (ref 26.0–34.0)
MCHC: 32.3 g/dL (ref 30.0–36.0)
MCV: 87.4 fL (ref 80.0–100.0)
Platelets: 351 10*3/uL (ref 150–400)
RBC: 3.82 MIL/uL — ABNORMAL LOW (ref 4.22–5.81)
RDW: 12.7 % (ref 11.5–15.5)
WBC: 6.8 10*3/uL (ref 4.0–10.5)
nRBC: 0 % (ref 0.0–0.2)

## 2019-08-06 SURGERY — AMPUTATION, FOOT, TRANSMETATARSAL
Anesthesia: Monitor Anesthesia Care | Site: Toe | Laterality: Left

## 2019-08-06 MED ORDER — LACTATED RINGERS IV SOLN: INTRAVENOUS | Status: DC

## 2019-08-06 MED ORDER — PROPOFOL 10 MG/ML IV BOLUS
INTRAVENOUS | Status: AC
  Filled 2019-08-06: qty 20

## 2019-08-06 MED ORDER — BUPIVACAINE HCL (PF) 0.25 % IJ SOLN
INTRAMUSCULAR | Status: DC | PRN
  Administered 2019-08-06 (×2): 15 mL

## 2019-08-06 MED ORDER — LIDOCAINE HCL (CARDIAC) PF 100 MG/5ML IV SOSY
PREFILLED_SYRINGE | INTRAVENOUS | Status: DC | PRN
  Administered 2019-08-06: 100 mg via INTRAVENOUS

## 2019-08-06 MED ORDER — FENTANYL CITRATE (PF) 250 MCG/5ML IJ SOLN
INTRAMUSCULAR | Status: AC
  Filled 2019-08-06: qty 5

## 2019-08-06 MED ORDER — PROPOFOL 1000 MG/100ML IV EMUL
INTRAVENOUS | Status: AC
  Filled 2019-08-06: qty 100

## 2019-08-06 MED ORDER — BUPIVACAINE HCL (PF) 0.25 % IJ SOLN
INTRAMUSCULAR | Status: AC
  Filled 2019-08-06: qty 30

## 2019-08-06 MED ORDER — ACETAMINOPHEN 500 MG PO TABS: 1000.0000 mg | ORAL_TABLET | Freq: Once | ORAL | Status: AC

## 2019-08-06 MED ORDER — LIDOCAINE HCL 1 % IJ SOLN
INTRAMUSCULAR | Status: DC | PRN
  Administered 2019-08-06 (×2): 15 mL

## 2019-08-06 MED ORDER — CELECOXIB 200 MG PO CAPS
200.0000 mg | ORAL_CAPSULE | Freq: Once | ORAL | Status: AC
  Administered 2019-08-06: 200 mg via ORAL
  Filled 2019-08-06 (×2): qty 1

## 2019-08-06 MED ORDER — LACTATED RINGERS IV SOLN: INTRAVENOUS | Status: DC | PRN

## 2019-08-06 MED ORDER — LIDOCAINE 2% (20 MG/ML) 5 ML SYRINGE
INTRAMUSCULAR | Status: AC
  Filled 2019-08-06: qty 5

## 2019-08-06 MED ORDER — LIDOCAINE HCL (PF) 1 % IJ SOLN
INTRAMUSCULAR | Status: AC
  Filled 2019-08-06: qty 30

## 2019-08-06 MED ORDER — MIDAZOLAM HCL 2 MG/2ML IJ SOLN
INTRAMUSCULAR | Status: AC
  Filled 2019-08-06: qty 2

## 2019-08-06 MED ORDER — FENTANYL CITRATE (PF) 100 MCG/2ML IJ SOLN: 25.0000 ug | INTRAMUSCULAR | Status: DC | PRN

## 2019-08-06 MED ORDER — MIDAZOLAM HCL 5 MG/5ML IJ SOLN
INTRAMUSCULAR | Status: DC | PRN
  Administered 2019-08-06: 2 mg via INTRAVENOUS

## 2019-08-06 MED ORDER — SODIUM CHLORIDE 0.9 % IR SOLN
Status: DC | PRN
  Administered 2019-08-06: 1000 mL

## 2019-08-06 MED ORDER — PROPOFOL 10 MG/ML IV BOLUS
INTRAVENOUS | Status: DC | PRN
  Administered 2019-08-06: 30 mg via INTRAVENOUS
  Administered 2019-08-06: 50 mg via INTRAVENOUS

## 2019-08-06 MED ORDER — ONDANSETRON HCL 4 MG/2ML IJ SOLN: 4.0000 mg | Freq: Once | INTRAMUSCULAR | Status: DC | PRN

## 2019-08-06 SURGICAL SUPPLY — 34 items
BLADE SAW SAG 73X12.5 (BLADE) ×2 IMPLANT
BLADE SURG 10 STRL SS (BLADE) ×3 IMPLANT
BNDG CMPR 9X4 STRL LF SNTH (GAUZE/BANDAGES/DRESSINGS) ×1
BNDG COHESIVE 4X5 TAN STRL (GAUZE/BANDAGES/DRESSINGS) ×3 IMPLANT
BNDG COHESIVE 6X5 TAN NS LF (GAUZE/BANDAGES/DRESSINGS) ×2 IMPLANT
BNDG ELASTIC 6X5.8 VLCR STR LF (GAUZE/BANDAGES/DRESSINGS) ×2 IMPLANT
BNDG ESMARK 4X9 LF (GAUZE/BANDAGES/DRESSINGS) ×2 IMPLANT
BNDG GAUZE ELAST 4 BULKY (GAUZE/BANDAGES/DRESSINGS) ×2 IMPLANT
COVER SURGICAL LIGHT HANDLE (MISCELLANEOUS) ×3 IMPLANT
DURAPREP 26ML APPLICATOR (WOUND CARE) ×3 IMPLANT
GAUZE SPONGE 4X4 12PLY STRL (GAUZE/BANDAGES/DRESSINGS) ×2 IMPLANT
GLOVE BIO SURGEON STRL SZ 6.5 (GLOVE) ×2 IMPLANT
GLOVE BIO SURGEONS STRL SZ 6.5 (GLOVE) ×1
GLOVE INDICATOR 6.5 STRL GRN (GLOVE) ×3 IMPLANT
GOWN STRL REUS W/ TWL LRG LVL3 (GOWN DISPOSABLE) ×2 IMPLANT
GOWN STRL REUS W/TWL LRG LVL3 (GOWN DISPOSABLE) ×6
KIT BASIN OR (CUSTOM PROCEDURE TRAY) ×3 IMPLANT
KIT TURNOVER KIT B (KITS) ×3 IMPLANT
NDL HYPO 25GX1X1/2 BEV (NEEDLE) ×1 IMPLANT
NEEDLE HYPO 25GX1X1/2 BEV (NEEDLE) ×3 IMPLANT
NS IRRIG 1000ML POUR BTL (IV SOLUTION) ×3 IMPLANT
PACK ORTHO EXTREMITY (CUSTOM PROCEDURE TRAY) ×3 IMPLANT
PAD ABD 8X10 STRL (GAUZE/BANDAGES/DRESSINGS) ×2 IMPLANT
STAPLER VISISTAT 35W (STAPLE) ×2 IMPLANT
SUT ETHILON 3 0 PS 1 (SUTURE) ×6 IMPLANT
SUT PROLENE 0 SH 30 (SUTURE) ×2 IMPLANT
SUT PROLENE 3 0 PS 2 (SUTURE) ×6 IMPLANT
SUT VIC AB 2-0 SH 27 (SUTURE) ×3
SUT VIC AB 2-0 SH 27XBRD (SUTURE) ×1 IMPLANT
SUT VIC AB 3-0 PS2 18 (SUTURE) ×2 IMPLANT
SUT VICRYL 3 0 (SUTURE) ×4 IMPLANT
SYR CONTROL 10ML LL (SYRINGE) ×7 IMPLANT
TUBE CONNECTING 12'X1/4 (SUCTIONS) ×1
TUBE CONNECTING 12X1/4 (SUCTIONS) ×2 IMPLANT

## 2019-08-06 NOTE — Plan of Care (Signed)
  Problem: Education: Goal: Knowledge of General Education information will improve Description: Including pain rating scale, medication(s)/side effects and non-pharmacologic comfort measures Outcome: Progressing   Problem: Health Behavior/Discharge Planning: Goal: Ability to manage health-related needs will improve Outcome: Progressing   Problem: Activity: Goal: Risk for activity intolerance will decrease Outcome: Progressing   Problem: Elimination: Goal: Will not experience complications related to bowel motility Outcome: Progressing   Problem: Pain Managment: Goal: General experience of comfort will improve Outcome: Progressing   Problem: Safety: Goal: Ability to remain free from injury will improve Outcome: Progressing   

## 2019-08-06 NOTE — Progress Notes (Signed)
PROGRESS NOTE    Danny Carpenter  NOM:[id number] DOB: 06/06/1950 DOA: 08/04/2019 PCP: Melony Overly, MD   Brief Narrative:   Danny Carpenter is a 69 y.o. male with medical history significant of HTN, HLD, T2DM, PAD s/p, left common femoral and profundofemoral endarterectomy with vein patch angioplasty, and L femoral to above knee popliteal artery bypass graft with reverse ipsilateral translocated saphenous vein 12/2018 and s/p L 5th digit amputation presents with worsening non healing ulcer of L foot. Patient presents for left foot ulcer.  Patient has had having more imbalance last week and 1 week prior did have a fall leading him to the ground with minor soft bump to the head.  No ongoing symptoms since that time no worsening gait ataxia.  No focal deficits.  He presents today with worsening ulcer and erythema with increased drainage from the left foot.  He states the bone has been exposed now for probably at least 3 to 5 days.  He has been bearing weight on this wearing appropriate shoe.  He reports ever since amputation of the left  fifth digit he has had difficulty with his gait. He denies other acute symptoms - no fevers, chills, sweats.  No cough, sob, chest pain.   Assessment & Plan:   Active Problems:   Diabetic foot infection ([place])   Subacute osteomyelitis of left foot (HCC)   PVD (peripheral vascular disease) (HCC)   PAD (peripheral artery disease) ([place])   AAA (abdominal aortic aneurysm) without rupture (HCC)   Osteomyelitis (HCC)   Acute L foot osteomyelitis and cellulitis, does not meet sepsis criteria, POA Profound history of PAD Charcot foot, DM foot ulcer and infection - Patient has outpatient X-rays with concern for osteo per review (obtaining records) - Vascular/Duda/Podiatry aware - appreciate insight and recommendations -tentative plan for metatarsal amputation later today, patient currently n.p.o. - Continue empiric coverage with Vanc and Zosyn (diabetic, risk for  polymicrobial/anaerobes) - wound care following - continue clopidogrel, rosuvastatin - continue pain meds and gabapentin, duloxetine  AKI, without history of CKD, resolved - hold lisinopril - monitor I/O - avoid nephrotoxic agents - IV fluids ongoing while n.p.o. perioperatively Lab Results  Component Value Date   CREATININE 0.94 08/06/2019   CREATININE 1.13 08/05/2019   CREATININE 1.28 (H) 08/04/2019   Chronic Diastolic Heart Failure, no acute exacerbation - Echo 12/2018 -Home medications include lisinopril, spironolactone, coreg; lisinopril currently on hold, resume postoperatively if creatinine remains within normal limits - Monitor closely given IV fluids ongoing as above while n.p.o.  HTN - continued coreg, amlodipine, held lisinopril  HLD - continue fenofibrate and rosuvastatin  T2DM, uncontrolled Lab Results  Component Value Date   HGBA1C 11.6 (H) 08/05/2019  - Continue ISS and CBG - Held home agents, started lantus but will need titration pending clinical course - Diabetic educator to follow  Hyponatremia - Sodium corrects to ~132 for hyperglycemia - Continue home medications including salt tabs  GERD - Continue pantoprazole  BPH - Continue tamsulosin  AAA - Follow up out-patient schedule for surveillance per guidelines  Questionable history of seizure disorder - Not taking Keppra currently  DVT prophylaxis: Heparin Code Status: Full Family Communication: None present Status is: Patient Dispo: The patient is from: Home              Anticipated d/c is to: Pending clinical evaluation/post surgical therapy              Anticipated d/c date is: Greater than 3 days  Patient currently not medically stable for discharge  Consultants:   Vascular surgery, Sharol Given, podiatry  Procedures:   None planned  Antimicrobials:  Vanco/Zosyn 08/05/2019, ongoing  Subjective: No acute issues or events overnight, denies fevers, chills, nausea,  vomiting, diarrhea, constipation.  Objective: Vitals:   08/05/19 1100 08/05/19 1200 08/05/19 2028 08/06/19 0412  BP: (!) 139/121  (!) 147/60 (!) 123/49  Pulse: 73  67 60  Resp:   16 14  Temp:  98.3 F (36.8 C) 98.1 F (36.7 C) 98.2 F (36.8 C)  TempSrc:  Oral Oral Oral  SpO2: 98%  96% 99%  Weight:      Height:        Intake/Output Summary (Last 24 hours) at 08/06/2019 0718 Last data filed at 08/06/2019 0400 Gross per 24 hour  Intake 907.57 ml  Output 500 ml  Net 407.57 ml   Filed Weights   08/04/19 1759  Weight: 97.5 kg    Examination:  General:  Pleasantly resting in bed, No acute distress. HEENT:  Normocephalic atraumatic.  Sclerae nonicteric, noninjected.  Extraocular movements intact bilaterally. Neck:  Without mass or deformity.  Trachea is midline. Lungs:  Clear to auscultate bilaterally without rhonchi, wheeze, or rales. Heart:  Regular rate and rhythm.  Without murmurs, rubs, or gallops. Abdomen:  Soft, nontender, nondistended.  Without guarding or rebound. Extremities: Diminished sensation bilaterally distally in both feet, equal pulses peripherally foot bandage clean dry intact Vascular:  Dorsalis pedis and posterior tibial pulses palpable bilaterally. Skin: As outlined above, otherwise without notable lesion, rash,  Data Reviewed: I have personally reviewed following labs and imaging studies  CBC: Recent Labs  Lab 08/04/19 1829 08/05/19 0309 08/06/19 0401  WBC 8.7 7.2 6.8  NEUTROABS 6.4  --   --   HGB 11.2* 10.9* 10.8*  HCT 34.5* 33.5* 33.4*  MCV 88.7 87.5 87.4  PLT 329 342 631   Basic Metabolic Panel: Recent Labs  Lab 08/04/19 1829 08/05/19 0309 08/06/19 0401  NA 125* 126* 132*  K 4.8 4.7 4.4  CL 92* 96* 100  CO2 22 23 22   GLUCOSE 390* 287* 190*  BUN 21 16 13   CREATININE 1.28* 1.13 0.94  CALCIUM 8.8* 8.5* 8.7*   GFR: Estimated Creatinine Clearance: 93.7 mL/min (by C-G formula based on SCr of 0.94 mg/dL). Liver Function Tests: Recent  Labs  Lab 08/04/19 1829 08/06/19 0401  AST 66* 47*  ALT 66* 54*  ALKPHOS 93 84  BILITOT 0.5 0.7  PROT 7.2 6.8  ALBUMIN 2.5* 2.3*   No results for input(s): LIPASE, AMYLASE in the last 168 hours. No results for input(s): AMMONIA in the last 168 hours. Coagulation Profile: No results for input(s): INR, PROTIME in the last 168 hours. Cardiac Enzymes: No results for input(s): CKTOTAL, CKMB, CKMBINDEX, TROPONINI in the last 168 hours. BNP (last 3 results) No results for input(s): PROBNP in the last 8760 hours. HbA1C: Recent Labs    08/05/19 0309  HGBA1C 11.6*   CBG: Recent Labs  Lab 08/05/19 0829 08/05/19 1049 08/05/19 1203 08/05/19 1728 08/05/19 2117  GLUCAP 286* 257* 262* 223* 232*   Lipid Profile: No results for input(s): CHOL, HDL, LDLCALC, TRIG, CHOLHDL, LDLDIRECT in the last 72 hours. Thyroid Function Tests: No results for input(s): TSH, T4TOTAL, FREET4, T3FREE, THYROIDAB in the last 72 hours. Anemia Panel: No results for input(s): VITAMINB12, FOLATE, FERRITIN, TIBC, IRON, RETICCTPCT in the last 72 hours. Sepsis Labs: Recent Labs  Lab 08/04/19 1829 08/04/19 2212  LATICACIDVEN 1.1 1.3  Recent Results (from the past 240 hour(s))  Blood culture (routine x 2)     Status: None (Preliminary result)   Collection Time: 08/04/19  9:59 PM   Specimen: BLOOD  Result Value Ref Range Status   Specimen Description BLOOD RIGHT ANTECUBITAL  Final   Special Requests   Final    BOTTLES DRAWN AEROBIC AND ANAEROBIC Blood Culture adequate volume   Culture   Final    NO GROWTH 2 DAYS Performed at [facility], [address], [place], [place] [address]    Report Status PENDING  Incomplete  Blood culture (routine x 2)     Status: None (Preliminary result)   Collection Time: 08/04/19 10:09 PM   Specimen: BLOOD  Result Value Ref Range Status   Specimen Description BLOOD RIGHT ANTECUBITAL  Final   Special Requests   Final    AEROBIC BOTTLE ONLY Blood Culture results  may not be optimal due to an inadequate volume of blood received in culture bottles   Culture   Final    NO GROWTH 2 DAYS Performed at [facility], [place] [address], [place], [place] [address]    Report Status PENDING  Incomplete  Respiratory Panel by RT PCR (Flu A&B, Covid) - Nasopharyngeal Swab     Status: None   Collection Time: 08/04/19 11:06 PM   Specimen: Nasopharyngeal Swab  Result Value Ref Range Status   SARS Coronavirus 2 by RT PCR NEGATIVE NEGATIVE Final    Comment: (NOTE) SARS-CoV-2 target nucleic acids are NOT DETECTED. The SARS-CoV-2 RNA is generally detectable in upper respiratoy specimens during the acute phase of infection. The lowest concentration of SARS-CoV-2 viral copies this assay can detect is 131 copies/mL. A negative result does not preclude SARS-Cov-2 infection and should not be used as the sole basis for treatment or other patient management decisions. A negative result may occur with  improper specimen collection/handling, submission of specimen other than nasopharyngeal swab, presence of viral mutation(s) within the areas targeted by this assay, and inadequate number of viral copies (<131 copies/mL). A negative result must be combined with clinical observations, patient history, and epidemiological information. The expected result is Negative. Fact Sheet for Patients:  PinkCheek.be Fact Sheet for Healthcare Providers:  GravelBags.it This test is not yet ap proved or cleared by the [place] FDA and  has been authorized for detection and/or diagnosis of SARS-CoV-2 by FDA under an Emergency Use Authorization (EUA). This EUA will remain  in effect (meaning this test can be used) for the duration of the COVID-19 declaration under Section 564(b)(1) of the Act, 21 U.S.C. section 360bbb-3(b)(1), unless the authorization is terminated or revoked sooner.    Influenza A by PCR NEGATIVE NEGATIVE  Final   Influenza B by PCR NEGATIVE NEGATIVE Final    Comment: (NOTE) The Xpert Xpress SARS-CoV-2/FLU/RSV assay is intended as an aid in  the diagnosis of influenza from Nasopharyngeal swab specimens and  should not be used as a sole basis for treatment. Nasal washings and  aspirates are unacceptable for Xpert Xpress SARS-CoV-2/FLU/RSV  testing. Fact Sheet for Patients: PinkCheek.be Fact Sheet for Healthcare Providers: GravelBags.it This test is not yet approved or cleared by the [place] FDA and  has been authorized for detection and/or diagnosis of SARS-CoV-2 by  FDA under an Emergency Use Authorization (EUA). This EUA will remain  in effect (meaning this test can be used) for the duration of the  Covid-19 declaration under Section 564(b)(1) of the Act,  21  U.S.C. section 360bbb-3(b)(1), unless the authorization is  terminated or revoked. Performed at [facility], [place] [address] [place], [place] [address]   Surgical pcr screen     Status: None   Collection Time: 08/05/19  1:28 PM   Specimen: Nasal Mucosa; Nasal Swab  Result Value Ref Range Status   MRSA, PCR NEGATIVE NEGATIVE Final   Staphylococcus aureus NEGATIVE NEGATIVE Final    Comment: (NOTE) The Xpert SA Assay (FDA approved for NASAL specimens in patients 44 years of age and older), is one component of a comprehensive surveillance program. It is not intended to diagnose infection nor to guide or monitor treatment. Performed at [facility], [place] [address] [place], [place] [address]          Radiology Studies: No results found.      Scheduled Meds: . acidophilus  1 capsule Oral Daily  . amLODipine  10 mg Oral Daily  . B-complex with vitamin C  1 tablet Oral Daily  . carvedilol  40 mg Oral QHS  . clopidogrel  75 mg Oral Daily  . docusate sodium  100 mg Oral BID  . DULoxetine  60 mg Oral Daily  . fenofibrate  160 mg Oral Daily    . gabapentin  300 mg Oral BID  . heparin  5,000 Units Subcutaneous Q8H  . insulin aspart  0-5 Units Subcutaneous QHS  . insulin aspart  0-9 Units Subcutaneous TID WC  . insulin glargine  5 Units Subcutaneous Daily  . loratadine  10 mg Oral Daily  . morphine  15 mg Oral Q12H  . pantoprazole  40 mg Oral QAC breakfast  . rosuvastatin  20 mg Oral Daily  . sodium chloride flush  3 mL Intravenous Q12H  . sodium chloride  2 g Oral BID AC & HS  . spironolactone  25 mg Oral Daily  . tamsulosin  0.4 mg Oral QHS  . traZODone  50 mg Oral QHS   Continuous Infusions: . piperacillin-tazobactam (ZOSYN)  IV 3.375 g (08/06/19 0548)  . vancomycin 1,250 mg (08/06/19 0213)     LOS: 1 day   Time spent: 45min  Yaviel Kloster C Hoyt Leanos, DO [facility]  If 7PM-7AM, please contact night-coverage [email or web address]  08/06/2019, 7:18 AM

## 2019-08-06 NOTE — Plan of Care (Signed)

## 2019-08-06 NOTE — Evaluation (Signed)
Occupational Therapy Evaluation Patient Details Name: Danny Carpenter MRN: [id number] DOB: October 20, 1950 Today's Date: 08/06/2019    History of Present Illness Pt is 69 y.o. male with medical history significant of HTN, HLD, T2DM, PAD s/p, left common femoral and profundofemoral endarterectomy with vein patch angioplasty, and L femoral to above knee popliteal artery bypass graft with reverse ipsilateral translocated saphenous vein 12/2018 and s/p L 5th digit amputation presents with worsening non healing ulcer of L foot. Admitted with L foot osteomyelitis/cellulitis with vascular and ortho consulted for possible surgery.   Clinical Impression   This 69 yo male admitted with above presents to acute OT with PLOF of Mod I to independent with basic and IADLs. Currently he is setup to min guard  A for basic ADLs in hospital environment. Sx for LLE (transmetatarsal amputation planned today), will need to be followed after surgery for further education with follow up [place] recommended.     Follow Up Recommendations  Home health OT;Supervision/Assistance - 24 hour    Equipment Recommendations  None recommended by OT       Precautions / Restrictions Precautions Precautions: Fall Restrictions Other Position/Activity Restrictions: Per podiatry note 09/05/2019--"May weightbear with assistance for bedside and bathroom only"      Mobility Bed Mobility Overal bed mobility: Needs Assistance Bed Mobility: Supine to Sit;Sit to Supine     Supine to sit: Supervision Sit to supine: Supervision      Transfers Overall transfer level: Needs assistance Equipment used: Rolling walker (2 wheeled) Transfers: Sit to/from Stand Sit to Stand: Min guard         General transfer comment: min guard safety; maintained WB'ing only on heel    Balance Overall balance assessment: Needs assistance Sitting-balance support: No upper extremity supported;Feet unsupported Sitting balance-Leahy Scale: Good      Standing balance support: No upper extremity supported Standing balance-Leahy Scale: Fair Standing balance comment: could maintain standing while he was A'd to doff current gown (wet from sweating)                           ADL either performed or assessed with clinical judgement   ADL Overall ADL's : Needs assistance/impaired Eating/Feeding: Independent;Sitting   Grooming: Set up;Sitting   Upper Body Bathing: Set up;Sitting   Lower Body Bathing: Min guard;Sit to/from stand   Upper Body Dressing : Set up;Sitting   Lower Body Dressing: Min guard;Sit to/from stand   Toilet Transfer: Min guard;Stand-pivot;RW   Toileting- Water quality scientist and Hygiene: Min guard;Sit to/from stand               Vision Patient Visual Report: No change from baseline              Pertinent Vitals/Pain Pain Assessment: 0-10 Pain Score: 4  Pain Location: LLE with moving around Pain Descriptors / Indicators: Aching Pain Intervention(s): Limited activity within patient's tolerance;Monitored during session;Repositioned     Hand Dominance Right   Extremity/Trunk Assessment Upper Extremity Assessment Upper Extremity Assessment: Overall WFL for tasks assessed           Communication Communication Communication: No difficulties   Cognition Arousal/Alertness: Awake/alert Behavior During Therapy: WFL for tasks assessed/performed Overall Cognitive Status: Within Functional Limits for tasks assessed  Home Living Family/patient expects to be discharged to:: Private residence Living Arrangements: Spouse/significant other Available Help at Discharge: Family;Available 24 hours/day(wife and son (wife goes to [facility])) Type of Home: House Home Access: Ramped entrance     Home Layout: One level     Bathroom Shower/Tub: Occupational psychologist: Handicapped height     Home Equipment: Environmental consultant - 4  wheels;Cane - single point;Bedside commode;Shower seat;Grab bars - toilet;Hand held shower head;Wheelchair - manual   Additional Comments: has Transport planner      Prior Functioning/Environment Level of Independence: Independent with assistive device(s);Needs assistance  Gait / Transfers Assistance Needed: could ambulate in community with rollator ADL's / Homemaking Assistance Needed: Independent with ADLs and IADLs   Comments: Pt goes to [facility] 1 day week        OT Problem List: Impaired balance (sitting and/or standing);Pain      OT Treatment/Interventions: Self-care/ADL training;DME and/or AE instruction;Patient/family education;Balance training    OT Goals(Current goals can be found in the care plan section) Acute Rehab OT Goals Patient Stated Goal: to have surgery today and then be able to go home OT Goal Formulation: With patient Time For Goal Achievement: 08/20/19 Potential to Achieve Goals: Good  OT Frequency: Min 2X/week              AM-PAC OT "6 Clicks" Daily Activity     Outcome Measure Help from another person eating meals?: None Help from another person taking care of personal grooming?: A Little Help from another person toileting, which includes using toliet, bedpan, or urinal?: A Little Help from another person bathing (including washing, rinsing, drying)?: A Little Help from another person to put on and taking off regular upper body clothing?: A Little Help from another person to put on and taking off regular lower body clothing?: A Little 6 Click Score: 19   End of Session Equipment Utilized During Treatment: Gait belt  Activity Tolerance: Patient tolerated treatment well Patient left: in bed;with call bell/phone within reach;with bed alarm set  OT Visit Diagnosis: Unsteadiness on feet (R26.81);Other abnormalities of gait and mobility (R26.89);Pain Pain - Right/Left: Right Pain - part of body: Ankle and joints of foot                 Time: 0165-5374 OT Time Calculation (min): 16 min Charges:  OT General Charges $OT Visit: 1 Visit OT Evaluation $OT Eval Moderate Complexity: 1 Mod  [place] Circle, OTR/L Acute [employer] Pager [phone] Office [phone]     Almon Register 08/06/2019, 9:34 AM

## 2019-08-06 NOTE — Progress Notes (Signed)
Dear Doctor:  This patient has been identified as a candidate for PICC for the following reason (s): drug pH or osmolality (causing phlebitis, infiltration in 24 hours) and restarts due to phlebitis and infiltration in 24 hours If you agree, please write an order for the indicated device. For any questions contact the Vascular Access Team at [phone] if no answer, please leave a message.  Thank you for supporting the early vascular access assessment program.

## 2019-08-06 NOTE — Op Note (Signed)
DATE: 08/06/2019  SURGEON: Landis Martins, DPM  PREOPERATIVE DIAGNOSIS:  Left foot diabetic ulcer, cellulitis, abscess, osteomyelitis  POSTOPERATIVE DIAGNOSIS: Same  PROCEDURE PERFORMED:Left transmetatarsal amputation  HEMOSTASIS:  Left ankle tourniquet  ESTIMATED BLOOD LOSS:  Less than 25 cc  ANESTHESIA:  Monitored anesthesia care with local pre and post 30 cc of 1:1 mixture 1% lidocaine and 0.25% marcaine plain  SPECIMENS:  Deep wound swab sent to micro Amputation specimen sent to pathology  COMPLICATIONS:  None.  INDICATIONS FOR PROCEDURE:  This patient is a pleasant 69 y.o. diabetic male patient who has been seen in for infected left foot ulcer with exposed bone osteomyelitis cellulitis abscess.  Patient opt for amputation. Risks and complications include but are not limited to infection, recurrence of symptoms, pain, numbness, wound dehiscence, delayed healing, as well as need for future surgery/further amputation.  No guarantees were given or applied.  All questions were answered to the patient's satisfaction, and the patient has consented to the above procedure.  All preoperative labs and H&P have been obtained and NPO status has been confirmed.  PROCEDURE IN DETAIL: After appropriate level of anesthesia, the patient's left lower extremity, with a tourniquet cuff in place, was prepped and draped in the usual sterile fashion. First, the outline of the amputation flap was made on the dorsum.  It is of note necrotic macerated ulcer at the medial left first metatarsal phalangeal joint was present.  The outline was marked out, starting on the medial side of the left foot to the mid lateral forefoot, and curved dorsally to the mid lateral part of the lateral border of the foot. On the plantar surface, the plantar flap outline,On both borders of the foot, it joined the dorsal outline. Next, the tourniquet cuff was then elevated to 250 mmHg after exsanguination of the extremity with the help of  Esmarch. First, the skin on the dorsal aspect was cut and the flap was fashioned. The extensor tendons were exposed. Any large veins encountered were ligated using electrocautery.  Care was taken to protect all vital neurovascular structures. The dorsal flap was fashioned out after cutting the extensor tendons. The dissection was carried out to the middle of the metatarsal level. The fifth metatarsal was small and deformed from previous amputation. Next, the dissection was carried out onto the plantar aspect, and the plantar flap was similarly fashioned and here also the flexor tendons were sectioned and allowed to retract proximally. Next, the bony cuts were made using sagittal saw, and these were made approximately at the mid to proximal level of the metatarsals and in a cascade fashion, transecting all of the 5 metatarsal bones. Next, the amputated forefoot was then removed. Next, the cut ends of the bone were rasped smooth. Following this, the wound was thoroughly irrigated and then swabbed for culture. Next, the tourniquet was released, and bleeding points controlled with electrocautery. After full hemostasis had been obtained, the wound was again thoroughly irrigated and then the dorsal plantar flap was further tailored so that the plantar flap was lying in a dorsal direction. The subcutaneous tissue was closed with interrupted sutures using #2-0 Vicryl and then the subcuticular layer was closed utilizing 3-0 Vicryl and finally the skin with interrupted sutures using a combination of #3-0 nylon, 0-prolene and staples. A Betadine wet to dry bulky foot dressing were applied, and the patient awakened and transferred to the recovery room in a stable condition.  DISPO: Patient to return to medical floor. Continue IV Abx.  If patient  remains stable tomorrow can plan for discharge to home on Sunday.  Podiatry to follow  Landis Martins, DPM

## 2019-08-06 NOTE — Plan of Care (Signed)

## 2019-08-06 NOTE — Progress Notes (Signed)
Inpatient Diabetes Program Recommendations  AACE/ADA: New Consensus Statement on Inpatient Glycemic Control (2015)  Target Ranges:  Prepandial:   less than 140 mg/dL      Peak postprandial:   less than 180 mg/dL (1-2 hours)      Critically ill patients:  140 - 180 mg/dL   Lab Results  Component Value Date   GLUCAP 315 (H) 08/06/2019   HGBA1C 11.6 (H) 08/05/2019    Review of Glycemic Control Results for [name], [name] (MRN [id number]) as of 08/06/2019 08:27  Ref. Range 08/05/2019 10:49 08/05/2019 12:03 08/05/2019 17:28 08/05/2019 21:17 08/06/2019 07:56  Glucose-Capillary Latest Ref Range: 70 - 99 mg/dL 257 (H) 262 (H) 223 (H) 232 (H) 315 (H)     Diabetes history: DM2  Outpatient Diabetes medications: Novolog 6-8 unit tid + Xultophy 22 units daily  Current orders for Inpatient glycemic control: Novolog 0-9 units tid + 0-5 qhs + Lantus 5 units daily   Inpatient Diabetes Program Recommendations:     Lantus 12 units daily  Thank you, Reche Dixon, RN, BSN Diabetes Coordinator Inpatient Diabetes Program [phone] (team pager from 8a-5p)

## 2019-08-06 NOTE — Anesthesia Procedure Notes (Signed)
Procedure Name: MAC Date/Time: 08/06/2019 6:36 PM Performed by: Claris Che, CRNA Pre-anesthesia Checklist: Patient identified, Emergency Drugs available, Suction available, Patient being monitored and Timeout performed Patient Re-evaluated:Patient Re-evaluated prior to induction Oxygen Delivery Method: Simple face mask

## 2019-08-06 NOTE — Transfer of Care (Signed)
Immediate Anesthesia Transfer of Care Note  Patient: Danny Carpenter  Procedure(s) Performed: TRANSMETATARSAL AMPUTATION (Left Toe)  Patient Location: PACU  Anesthesia Type:MAC combined with regional for post-op pain  Level of Consciousness: awake, alert  and oriented  Airway & Oxygen Therapy: Patient Spontanous Breathing  Post-op Assessment: Report given to RN, Post -op Vital signs reviewed and stable and Patient moving all extremities X 4  Post vital signs: Reviewed and stable  Last Vitals:  Vitals Value Taken Time  BP 152/67 08/06/19 2009  Temp    Pulse 59 08/06/19 2012  Resp 13 08/06/19 2012  SpO2 100 % 08/06/19 2012  Vitals shown include unvalidated device data.  Last Pain:  Vitals:   08/06/19 1425  TempSrc: Oral  PainSc:          Complications: No apparent anesthesia complications

## 2019-08-06 NOTE — Anesthesia Preprocedure Evaluation (Addendum)
Anesthesia Evaluation  Patient identified by MRN, date of birth, ID band Patient awake    Reviewed: Allergy & Precautions, H&P , NPO status , Patient's Chart, lab work & pertinent test results, reviewed documented beta blocker date and time   History of Anesthesia Complications (+) PONV and history of anesthetic complications  Airway Mallampati: IV  TM Distance: >3 FB Neck ROM: Full    Dental  (+) Dental Advisory Given, Poor Dentition, Missing   Pulmonary sleep apnea , COPD, former smoker,    Pulmonary exam normal breath sounds clear to auscultation       Cardiovascular hypertension, Pt. on medications and On Home Beta Blockers + Past MI, + Peripheral Vascular Disease and +CHF  Normal cardiovascular exam Rhythm:Regular Rate:Normal     Neuro/Psych Seizures -, Well Controlled,  PSYCHIATRIC DISORDERS Anxiety Depression CVA    GI/Hepatic Neg liver ROS, GERD  Medicated and Controlled,  Endo/Other  diabetes, Poorly Controlled, Type 2, Insulin Dependent, Oral Hypoglycemic Agents  Renal/GU negative Renal ROS  negative genitourinary   Musculoskeletal  (+) Arthritis , Osteoarthritis,    Abdominal   Peds  Hematology negative hematology ROS (+)   Anesthesia Other Findings   Reproductive/Obstetrics negative OB ROS                            Anesthesia Physical  Anesthesia Plan  ASA: III  Anesthesia Plan: MAC   Post-op Pain Management:    Induction: Intravenous  PONV Risk Score and Plan: 2 and Ondansetron, Midazolam, Treatment may vary due to age or medical condition and Propofol infusion  Airway Management Planned: Natural Airway and Simple Face Mask  Additional Equipment:   Intra-op Plan:   Post-operative Plan:   Informed Consent: I have reviewed the patients History and Physical, chart, labs and discussed the procedure including the risks, benefits and alternatives for the proposed  anesthesia with the patient or authorized representative who has indicated his/her understanding and acceptance.     Dental advisory given  Plan Discussed with: CRNA  Anesthesia Plan Comments: (   )       Anesthesia Quick Evaluation

## 2019-08-07 LAB — COMPREHENSIVE METABOLIC PANEL
ALT: 46 U/L — ABNORMAL HIGH (ref 0–44)
AST: 35 U/L (ref 15–41)
Albumin: 2.4 g/dL — ABNORMAL LOW (ref 3.5–5.0)
Alkaline Phosphatase: 81 U/L (ref 38–126)
Anion gap: 10 (ref 5–15)
BUN: 13 mg/dL (ref 8–23)
CO2: 24 mmol/L (ref 22–32)
Calcium: 8.8 mg/dL — ABNORMAL LOW (ref 8.9–10.3)
Chloride: 96 mmol/L — ABNORMAL LOW (ref 98–111)
Creatinine, Ser: 1.02 mg/dL (ref 0.61–1.24)
GFR calc Af Amer: >60 mL/min (ref >60)
GFR calc non Af Amer: >60 mL/min (ref >60)
Glucose, Bld: 340 mg/dL — ABNORMAL HIGH (ref 70–99)
Potassium: 4.6 mmol/L (ref 3.5–5.1)
Sodium: 130 mmol/L — ABNORMAL LOW (ref 135–145)
Total Bilirubin: 0.5 mg/dL (ref 0.3–1.2)
Total Protein: 6.8 g/dL (ref 6.5–8.1)

## 2019-08-07 LAB — CBC
HCT: 34.5 % — ABNORMAL LOW (ref 39.0–52.0)
Hemoglobin: 11.2 g/dL — ABNORMAL LOW (ref 13.0–17.0)
MCH: 28.8 pg (ref 26.0–34.0)
MCHC: 32.5 g/dL (ref 30.0–36.0)
MCV: 88.7 fL (ref 80.0–100.0)
Platelets: 354 10*3/uL (ref 150–400)
RBC: 3.89 MIL/uL — ABNORMAL LOW (ref 4.22–5.81)
RDW: 12.6 % (ref 11.5–15.5)
WBC: 7.9 10*3/uL (ref 4.0–10.5)
nRBC: 0 % (ref 0.0–0.2)

## 2019-08-07 LAB — GLUCOSE, CAPILLARY
Glucose-Capillary: 333 mg/dL — ABNORMAL HIGH (ref 70–99)
Glucose-Capillary: 283 mg/dL — ABNORMAL HIGH (ref 70–99)
Glucose-Capillary: 364 mg/dL — ABNORMAL HIGH (ref 70–99)
Glucose-Capillary: 281 mg/dL — ABNORMAL HIGH (ref 70–99)

## 2019-08-07 MED ORDER — MORPHINE SULFATE (PF) 2 MG/ML IV SOLN
2.0000 mg | Freq: Once | INTRAVENOUS | Status: AC
  Administered 2019-08-07: 2 mg via INTRAVENOUS
  Filled 2019-08-07: qty 1

## 2019-08-07 NOTE — Progress Notes (Signed)
PROGRESS NOTE    [name]  ZHY:[id number] DOB: 1950-08-08 DOA: 08/04/2019 PCP: Melony Overly, MD   Brief Narrative:   Danny Carpenter is a 69 y.o. male with medical history significant of HTN, HLD, T2DM, PAD s/p, left common femoral and profundofemoral endarterectomy with vein patch angioplasty, and L femoral to above knee popliteal artery bypass graft with reverse ipsilateral translocated saphenous vein 12/2018 and s/p L 5th digit amputation presents with worsening non healing ulcer of L foot. Patient presents for left foot ulcer.  Patient has had having more imbalance last week and 1 week prior did have a fall leading him to the ground with minor soft bump to the head.  No ongoing symptoms since that time no worsening gait ataxia.  No focal deficits.  He presents today with worsening ulcer and erythema with increased drainage from the left foot.  He states the bone has been exposed now for probably at least 3 to 5 days.  He has been bearing weight on this wearing appropriate shoe.  He reports ever since amputation of the left  fifth digit he has had difficulty with his gait. He denies other acute symptoms - no fevers, chills, sweats.  No cough, sob, chest pain.   Assessment & Plan:   Active Problems:   Diabetic foot infection ([place])   Subacute osteomyelitis of left foot ([facility])   PVD (peripheral vascular disease) ([facility])   PAD (peripheral artery disease) ([place])   AAA (abdominal aortic aneurysm) without rupture ([facility])   Osteomyelitis ([facility])   Acute L foot osteomyelitis and cellulitis, does not meet sepsis criteria, POA Profound history of PAD Charcot foot, DM foot ulcer and infection -Status post transmetatarsal amputation 08/06/2019, tolerated well -Surgery continues to recommend antibiotics, unclear if clean margins in the OR - defer to their expertise for transition to p.o. antibiotics currently recommending Bactrim versus Augmentin pending cultures, likely discharge in the next 24 to 48  hours - continue clopidogrel, rosuvastatin - continue pain meds and gabapentin, duloxetine  AKI, without history of CKD, resolved - hold lisinopril - monitor I/O - avoid nephrotoxic agents - IV fluids ongoing while n.p.o. perioperatively Lab Results  Component Value Date   CREATININE 1.02 08/07/2019   CREATININE 0.94 08/06/2019   CREATININE 1.13 08/05/2019   Chronic Diastolic Heart Failure, no acute exacerbation - Echo 12/2018 -Home medications include lisinopril, spironolactone, coreg; lisinopril currently on hold, resume postoperatively if creatinine remains within normal limits - Monitor closely given IV fluids ongoing as above while n.p.o.  HTN - continued coreg, amlodipine, held lisinopril  HLD - continue fenofibrate and rosuvastatin  T2DM, uncontrolled Lab Results  Component Value Date   HGBA1C 11.6 (H) 08/05/2019  - Continue ISS and CBG - Held home agents, started lantus but will need titration pending clinical course - Diabetic educator to follow  Hyponatremia - Sodium corrects to ~132 for hyperglycemia - Continue home medications including salt tabs  GERD - Continue pantoprazole  BPH - Continue tamsulosin  AAA - Follow up out-patient schedule for surveillance per guidelines  Questionable history of seizure disorder - Not taking Keppra currently  DVT prophylaxis: Heparin Code Status: Full Family Communication: None present Status is: Patient Dispo: The patient is from: Home              Anticipated d/c is to: Likely home              Anticipated d/c date is: 24 to 48 hours pending clinical course, pain control and cultures  Patient currently not medically stable for discharge, continues to require further postoperative evaluation, antibiotics, pending cultures and PT evaluation  Consultants:   Vascular surgery, Sharol Given, podiatry  Procedures:   None planned  Antimicrobials:  Vanco/Zosyn 08/05/2019, ongoing  Subjective: No  acute issues or events overnight, denies fevers, chills, nausea, vomiting, diarrhea, constipation.  Pain moderately well controlled at this time.  Objective: Vitals:   08/06/19 2100 08/06/19 2130 08/07/19 0041 08/07/19 0422  BP:  (!) 160/64 (!) 141/62 (!) 146/59  Pulse:  62 60 (!) 54  Resp:  16 18 18   Temp:  97.9 F (36.6 C) 98.2 F (36.8 C) 98.3 F (36.8 C)  TempSrc:  Axillary Oral Oral  SpO2: 100% 98% 98% 100%  Weight:      Height:        Intake/Output Summary (Last 24 hours) at 08/07/2019 0732 Last data filed at 08/07/2019 0400 Gross per 24 hour  Intake 1572.66 ml  Output 1855 ml  Net -282.34 ml   Filed Weights   08/04/19 1759  Weight: 97.5 kg    Examination:  General:  Pleasantly resting in bed, No acute distress. HEENT:  Normocephalic atraumatic.  Sclerae nonicteric, noninjected.  Extraocular movements intact bilaterally. Neck:  Without mass or deformity.  Trachea is midline. Lungs:  Clear to auscultate bilaterally without rhonchi, wheeze, or rales. Heart:  Regular rate and rhythm.  Without murmurs, rubs, or gallops. Abdomen:  Soft, nontender, nondistended.  Without guarding or rebound. Extremities: Diminished sensation right foot, left foot bandage clean dry intact Vascular:  Dorsalis pedis and posterior tibial pulses palpable bilaterally. Skin: As outlined above, otherwise without notable lesion, rash,  Data Reviewed: I have personally reviewed following labs and imaging studies  CBC: Recent Labs  Lab 08/04/19 1829 08/05/19 0309 08/06/19 0401 08/07/19 0301  WBC 8.7 7.2 6.8 7.9  NEUTROABS 6.4  --   --   --   HGB 11.2* 10.9* 10.8* 11.2*  HCT 34.5* 33.5* 33.4* 34.5*  MCV 88.7 87.5 87.4 88.7  PLT 329 342 351 308   Basic Metabolic Panel: Recent Labs  Lab 08/04/19 1829 08/05/19 0309 08/06/19 0401 08/07/19 0301  NA 125* 126* 132* 130*  K 4.8 4.7 4.4 4.6  CL 92* 96* 100 96*  CO2 22 23 22 24   GLUCOSE 390* 287* 190* 340*  BUN 21 16 13 13   CREATININE  1.28* 1.13 0.94 1.02  CALCIUM 8.8* 8.5* 8.7* 8.8*   GFR: Estimated Creatinine Clearance: 86.4 mL/min (by C-G formula based on SCr of 1.02 mg/dL). Liver Function Tests: Recent Labs  Lab 08/04/19 1829 08/06/19 0401 08/07/19 0301  AST 66* 47* 35  ALT 66* 54* 46*  ALKPHOS 93 84 81  BILITOT 0.5 0.7 0.5  PROT 7.2 6.8 6.8  ALBUMIN 2.5* 2.3* 2.4*   No results for input(s): LIPASE, AMYLASE in the last 168 hours. No results for input(s): AMMONIA in the last 168 hours. Coagulation Profile: No results for input(s): INR, PROTIME in the last 168 hours. Cardiac Enzymes: No results for input(s): CKTOTAL, CKMB, CKMBINDEX, TROPONINI in the last 168 hours. BNP (last 3 results) No results for input(s): PROBNP in the last 8760 hours. HbA1C: Recent Labs    08/05/19 0309  HGBA1C 11.6*   CBG: Recent Labs  Lab 08/05/19 2117 08/06/19 0756 08/06/19 1127 08/06/19 1703 08/06/19 2010  GLUCAP 232* 315* 328* 171* 167*   Lipid Profile: No results for input(s): CHOL, HDL, LDLCALC, TRIG, CHOLHDL, LDLDIRECT in the last 72 hours. Thyroid Function Tests: No results  for input(s): TSH, T4TOTAL, FREET4, T3FREE, THYROIDAB in the last 72 hours. Anemia Panel: No results for input(s): VITAMINB12, FOLATE, FERRITIN, TIBC, IRON, RETICCTPCT in the last 72 hours. Sepsis Labs: Recent Labs  Lab 08/04/19 1829 08/04/19 2212  LATICACIDVEN 1.1 1.3    Recent Results (from the past 240 hour(s))  Blood culture (routine x 2)     Status: None (Preliminary result)   Collection Time: 08/04/19  9:59 PM   Specimen: BLOOD  Result Value Ref Range Status   Specimen Description BLOOD RIGHT ANTECUBITAL  Final   Special Requests   Final    BOTTLES DRAWN AEROBIC AND ANAEROBIC Blood Culture adequate volume   Culture   Final    NO GROWTH 2 DAYS Performed at [facility], [place] [address], [place], [place] [address]    Report Status PENDING  Incomplete  Blood culture (routine x 2)     Status: None (Preliminary result)    Collection Time: 08/04/19 10:09 PM   Specimen: BLOOD  Result Value Ref Range Status   Specimen Description BLOOD RIGHT ANTECUBITAL  Final   Special Requests   Final    AEROBIC BOTTLE ONLY Blood Culture results may not be optimal due to an inadequate volume of blood received in culture bottles   Culture   Final    NO GROWTH 2 DAYS Performed at [facility], [place] [address] [place], [place] [address]    Report Status PENDING  Incomplete  Respiratory Panel by RT PCR (Flu A&B, Covid) - Nasopharyngeal Swab     Status: None   Collection Time: 08/04/19 11:06 PM   Specimen: Nasopharyngeal Swab  Result Value Ref Range Status   SARS Coronavirus 2 by RT PCR NEGATIVE NEGATIVE Final    Comment: (NOTE) SARS-CoV-2 target nucleic acids are NOT DETECTED. The SARS-CoV-2 RNA is generally detectable in upper respiratoy specimens during the acute phase of infection. The lowest concentration of SARS-CoV-2 viral copies this assay can detect is 131 copies/mL. A negative result does not preclude SARS-Cov-2 infection and should not be used as the sole basis for treatment or other patient management decisions. A negative result may occur with  improper specimen collection/handling, submission of specimen other than nasopharyngeal swab, presence of viral mutation(s) within the areas targeted by this assay, and inadequate number of viral copies (<131 copies/mL). A negative result must be combined with clinical observations, patient history, and epidemiological information. The expected result is Negative. Fact Sheet for Patients:  PinkCheek.be Fact Sheet for Healthcare Providers:  GravelBags.it This test is not yet ap proved or cleared by the [place] FDA and  has been authorized for detection and/or diagnosis of SARS-CoV-2 by FDA under an Emergency Use Authorization (EUA). This EUA will remain  in effect (meaning this test can be used)  for the duration of the COVID-19 declaration under Section 564(b)(1) of the Act, 21 U.S.C. section 360bbb-3(b)(1), unless the authorization is terminated or revoked sooner.    Influenza A by PCR NEGATIVE NEGATIVE Final   Influenza B by PCR NEGATIVE NEGATIVE Final    Comment: (NOTE) The Xpert Xpress SARS-CoV-2/FLU/RSV assay is intended as an aid in  the diagnosis of influenza from Nasopharyngeal swab specimens and  should not be used as a sole basis for treatment. Nasal washings and  aspirates are unacceptable for Xpert Xpress SARS-CoV-2/FLU/RSV  testing. Fact Sheet for Patients: PinkCheek.be Fact Sheet for Healthcare Providers: GravelBags.it This test is not yet approved or cleared by the [place] and  has been authorized for detection and/or diagnosis of SARS-CoV-2 by  FDA under an Emergency Use Authorization (EUA). This EUA will remain  in effect (meaning this test can be used) for the duration of the  Covid-19 declaration under Section 564(b)(1) of the Act, 21  U.S.C. section 360bbb-3(b)(1), unless the authorization is  terminated or revoked. Performed at [facility], [place] [address], [place], [place] [address]   Surgical pcr screen     Status: None   Collection Time: 08/05/19  1:28 PM   Specimen: Nasal Mucosa; Nasal Swab  Result Value Ref Range Status   MRSA, PCR NEGATIVE NEGATIVE Final   Staphylococcus aureus NEGATIVE NEGATIVE Final    Comment: (NOTE) The Xpert SA Assay (FDA approved for NASAL specimens in patients 23 years of age and older), is one component of a comprehensive surveillance program. It is not intended to diagnose infection nor to guide or monitor treatment. Performed at [facility], [place] [address] [place], [place] [address]   Aerobic/Anaerobic Culture (surgical/deep wound)     Status: None (Preliminary result)   Collection Time: 08/06/19  7:30 PM   Specimen: PATH Amputaion  Arm/Leg; Tissue  Result Value Ref Range Status   Specimen Description FOOT LEFT AMPUTATION  Final   Special Requests NONE  Final   Gram Stain   Final    NO WBC SEEN NO ORGANISMS SEEN Performed at [facility], [place] [address], [place], [place] [address]    Culture PENDING  Incomplete   Report Status PENDING  Incomplete    Radiology Studies: DG Foot 2 Views Left  Result Date: 08/06/2019 CLINICAL DATA:  Left foot mid tarsal amputation EXAM: LEFT FOOT - 2 VIEW COMPARISON:  08/04/2019 FINDINGS: Frontal and lateral views of the left foot demonstrate amputation of the left midfoot at the level of the proximal metatarsals. Postsurgical changes are seen within the soft tissues, with numerous skin staples. Chronic fractures are seen at the residual second through fourth metatarsals. IMPRESSION: 1. Postsurgical changes from left midfoot amputation as above. 2. Chronic posttraumatic changes at the base of the second through fourth metatarsals. Electronically Signed   By: Randa Ngo M.D.   On: 08/06/2019 20:37   Scheduled Meds: . acidophilus  1 capsule Oral Daily  . amLODipine  10 mg Oral Daily  . B-complex with vitamin C  1 tablet Oral Daily  . carvedilol  40 mg Oral QHS  . clopidogrel  75 mg Oral Daily  . docusate sodium  100 mg Oral BID  . DULoxetine  60 mg Oral Daily  . fenofibrate  160 mg Oral Daily  . gabapentin  300 mg Oral BID  . heparin  5,000 Units Subcutaneous Q8H  . insulin aspart  0-5 Units Subcutaneous QHS  . insulin aspart  0-9 Units Subcutaneous TID WC  . insulin glargine  5 Units Subcutaneous Daily  . loratadine  10 mg Oral Daily  . morphine  15 mg Oral Q12H  . pantoprazole  40 mg Oral QAC breakfast  . rosuvastatin  20 mg Oral Daily  . sodium chloride flush  3 mL Intravenous Q12H  . sodium chloride  2 g Oral BID AC & HS  . spironolactone  25 mg Oral Daily  . tamsulosin  0.4 mg Oral QHS  . traZODone  50 mg Oral QHS   Continuous Infusions: . lactated ringers  Stopped (08/06/19 2009)  . lactated ringers    . piperacillin-tazobactam (ZOSYN)  IV 3.375 g (  08/07/19 0544)  . vancomycin 1,250 mg (08/07/19 0230)     LOS: 2 days   Time spent: 37min  Sherial Ebrahim C Branden Vine, DO [facility]  If 7PM-7AM, please contact night-coverage [email or web address]  08/07/2019, 7:32 AM

## 2019-08-07 NOTE — Anesthesia Postprocedure Evaluation (Signed)
Anesthesia Post Note  Patient: Danny Carpenter  Procedure(s) Performed: TRANSMETATARSAL AMPUTATION (Left Toe)     Patient location during evaluation: PACU Anesthesia Type: MAC Level of consciousness: awake and alert Pain management: pain level controlled Vital Signs Assessment: post-procedure vital signs reviewed and stable Respiratory status: spontaneous breathing, nonlabored ventilation and respiratory function stable Cardiovascular status: blood pressure returned to baseline Anesthetic complications: no    Last Vitals:  Vitals:   08/06/19 2130 08/07/19 0041  BP: (!) 160/64 (!) 141/62  Pulse: 62 60  Resp: 16 18  Temp: 36.6 C 36.8 C  SpO2: 98% 98%    Last Pain:  Vitals:   08/07/19 0152  TempSrc:   PainSc: 0-No pain                 Danisa Kopec COKER

## 2019-08-07 NOTE — Progress Notes (Signed)
Podiatry Post Op Progress Note  Subjective: Danny Carpenter is a 69 y.o. male patient seen at bedside, resting comfortably in no acute distress s/p day #1 Left transmetatarsal amputation performed on 08/06/2019. Patient admits some pain at surgical site, feels better with elevation, denies calf pain, denies headache, chest pain, shortness of breath, nausea, vomitting, denies loss of appetite, denies problems with voiding. No other issues noted.   Patient Active Problem List   Diagnosis Date Noted  . Osteomyelitis ([place]) 08/05/2019  . AAA (abdominal aortic aneurysm) without rupture ([place]) 06/11/2019  . Hyperlipidemia 06/11/2019  . PICC (peripherally inserted central catheter) in place 02/16/2019  . Encounter for medication management 02/16/2019  . PAD (peripheral artery disease) ([place]) 01/27/2019  . Type 2 diabetes mellitus with other specified complication ([place])   . Subacute osteomyelitis of left foot ([place])   . PVD (peripheral vascular disease) ([place])   . CVA (cerebral vascular accident) ([place]) 01/17/2019  . Diabetic foot infection ([place]) 01/17/2019  . Weight loss 03/19/2015  . Chest wall pain 02/06/2015  . Chronic fatigue 01/09/2015  . Eye pain 09/23/2014  . Pain medication agreement completed 02/06/2014  . Chronic pain 04/05/2013  . Senile nuclear sclerosis 12/06/2011  . Tear film insufficiency 12/06/2011  . Depression 01/07/2005  . DDD (degenerative disc disease) 10/08/2004  . Proteinuria 10/21/2002     Current Facility-Administered Medications:  .  acetaminophen (TYLENOL) tablet 650 mg, 650 mg, Oral, Q6H PRN, 650 mg at 08/07/19 0854 **OR** acetaminophen (TYLENOL) suppository 650 mg, 650 mg, Rectal, Q6H PRN, Truddie Hidden, MD .  acidophilus (RISAQUAD) capsule 1 capsule, 1 capsule, Oral, Daily, Truddie Hidden, MD, 1 capsule at 08/07/19 0850 .  amLODipine (NORVASC) tablet 10 mg, 10 mg, Oral, Daily, Truddie Hidden, MD, 10 mg at 08/07/19 0850 .  B-complex with vitamin C tablet 1  tablet, 1 tablet, Oral, Daily, Truddie Hidden, MD, 1 tablet at 08/07/19 0850 .  carvedilol (COREG CR) 24 hr capsule 40 mg, 40 mg, Oral, QHS, Truddie Hidden, MD, 40 mg at 08/06/19 2140 .  clopidogrel (PLAVIX) tablet 75 mg, 75 mg, Oral, Daily, Truddie Hidden, MD, 75 mg at 08/07/19 0849 .  docusate sodium (COLACE) capsule 100 mg, 100 mg, Oral, BID, Truddie Hidden, MD, 100 mg at 08/07/19 0849 .  DULoxetine (CYMBALTA) DR capsule 60 mg, 60 mg, Oral, Daily, Truddie Hidden, MD, 60 mg at 08/07/19 0849 .  fenofibrate tablet 160 mg, 160 mg, Oral, Daily, Truddie Hidden, MD, 160 mg at 08/07/19 0851 .  gabapentin (NEURONTIN) capsule 300 mg, 300 mg, Oral, BID, Truddie Hidden, MD, 300 mg at 08/07/19 0849 .  heparin injection 5,000 Units, 5,000 Units, Subcutaneous, Q8H, Little Ishikawa, MD, 5,000 Units at 08/07/19 0541 .  hydroxypropyl methylcellulose / hypromellose (ISOPTO TEARS / GONIOVISC) 2.5 % ophthalmic solution 1 drop, 1 drop, Both Eyes, Daily PRN, Truddie Hidden, MD .  insulin aspart (novoLOG) injection 0-5 Units, 0-5 Units, Subcutaneous, QHS, Truddie Hidden, MD, 2 Units at 08/05/19 2131 .  insulin aspart (novoLOG) injection 0-9 Units, 0-9 Units, Subcutaneous, TID WC, Truddie Hidden, MD, 7 Units at 08/07/19 [phone]  insulin glargine (LANTUS) injection 5 Units, 5 Units, Subcutaneous, Daily, Truddie Hidden, MD, 5 Units at 08/07/19 1025 .  lactated ringers infusion, , Intravenous, Continuous, Duane Boston, MD, Stopped at 08/06/19 2009 .  lactated ringers infusion, , Intravenous, Continuous, Catalina Gravel, MD .  loratadine (CLARITIN) tablet 10 mg, 10 mg, Oral, Daily, Truddie Hidden, MD, 10 mg at 08/07/19 0850 .  morphine (MS CONTIN)  12 hr tablet 15 mg, 15 mg, Oral, Q12H, Truddie Hidden, MD, 15 mg at 08/07/19 0850 .  oxyCODONE (Oxy IR/ROXICODONE) immediate release tablet 5 mg, 5 mg, Oral, Q4H PRN, Truddie Hidden, MD, 5 mg at 08/07/19 1022 .  pantoprazole (PROTONIX) EC  tablet 40 mg, 40 mg, Oral, QAC breakfast, Truddie Hidden, MD, 40 mg at 08/07/19 0849 .  piperacillin-tazobactam (ZOSYN) IVPB 3.375 g, 3.375 g, Intravenous, Q8H, Delo, Douglas, MD, Last Rate: 12.5 mL/hr at 08/07/19 0544, 3.375 g at 08/07/19 0544 .  rosuvastatin (CRESTOR) tablet 20 mg, 20 mg, Oral, Daily, Truddie Hidden, MD, 20 mg at 08/07/19 0850 .  sodium chloride flush (NS) 0.9 % injection 3 mL, 3 mL, Intravenous, Q12H, Truddie Hidden, MD, 3 mL at 08/06/19 0935 .  sodium chloride tablet 2 g, 2 g, Oral, BID AC & HS, Truddie Hidden, MD, 2 g at 08/07/19 0849 .  spironolactone (ALDACTONE) tablet 25 mg, 25 mg, Oral, Daily, Truddie Hidden, MD, 25 mg at 08/07/19 0850 .  tamsulosin (FLOMAX) capsule 0.4 mg, 0.4 mg, Oral, QHS, Truddie Hidden, MD, 0.4 mg at 08/06/19 2140 .  traZODone (DESYREL) tablet 50 mg, 50 mg, Oral, QHS, Truddie Hidden, MD, 50 mg at 08/06/19 2140 .  vancomycin (VANCOREADY) IVPB 1250 mg/250 mL, 1,250 mg, Intravenous, Q12H, Karren Cobble, RPH, Last Rate: 166.7 mL/hr at 08/07/19 0230, 1,250 mg at 08/07/19 0230  No Known Allergies   Objective: Today's Vitals   08/07/19 0545 08/07/19 0809 08/07/19 0854 08/07/19 1022  BP:  130/62    Pulse:  67    Resp:  18    Temp:  98.6 F (37 C)    TempSrc:  Oral    SpO2:  100%    Weight:      Height:      PainSc: 10-Worst pain ever  10-Worst pain ever 10-Worst pain ever    General: No acute distress  Left Lower extremity: Dressing to left foot clean, dry, intact. No strikethrough noted, Upon removal of dressings sutures and staples intact with no dehiscence.  Minimal erythema, mild focal edema, mild blood drainage. Flap fill time intact. No other acute signs of infection. No calf pain. Range of motion excluding surgical site within normal limits with no pain or crepitation.         Post Op Xray, Left foot: IMPRESSION: 1. Postsurgical changes from left midfoot amputation as above. 2. Chronic posttraumatic changes at  the base of the second through fourth metatarsals.  Assessment and Plan:  Problem List Items Addressed This Visit    None    Visit Diagnoses    Foot infection    -  Primary   Relevant Medications   cefTRIAXone (ROCEPHIN) 2 g in sodium chloride 0.9 % 100 mL IVPB (Completed)   vancomycin (VANCOREADY) IVPB 1250 mg/250 mL   Status post transmetatarsal amputation of foot, left (HCC)       Relevant Orders   DG Foot 2 Views Left (Completed)     -Patient seen and evaluated at bedside -Dressing change performed -Advised patient to make sure to keep dressing clean, dry, and intact to Left foot allowing nursing to reinforce dressings as needed as ordered -Awaiting intra-op cultures; however patient is doing clinically well and amputation was performed with clean margins; may discontinue IV and transition patient to oral Bactrim or Augmentin.  I can follow up the intraoperative cultures on outpatient basis and adjust antibiotics as needed. -Weightbearing to heel with post op shoe for bedside and bathroom only  with assistive device or with PT as ordered -Continue with rest and elevation to assist with pain and edema control  -Podiatry will continue to follow closely after discharge -Anticipated discharge plan of care: To home with oral antibiotics on tomorrow 5/9.  Patient will follow-up later next week in my [place] office.  My office will contact patient to schedule follow-up appointment.  Landis Martins, DPM [facility] [phone] office [phone] cell

## 2019-08-07 NOTE — Plan of Care (Signed)
  Problem: Pain Managment: Goal: General experience of comfort will improve Outcome: Progressing   Problem: Safety: Goal: Ability to remain free from injury will improve Outcome: Progressing   Problem: Skin Integrity: Goal: Risk for impaired skin integrity will decrease Outcome: Progressing   

## 2019-08-07 NOTE — Progress Notes (Signed)
Orthopedic Tech Progress Note Patient Details:  Danny Carpenter 01/02/1951 [id number] Pt was sitting in chair with foot elevated and wrapped, pt was worried about size of shoe not fitting correctly, pt was informed that in case shoe doesn't fit to notify RN and new size brought to pt  Ortho Devices Type of Ortho Device: Postop shoe/boot   Post Interventions Patient Tolerated: Other (comment)   Majel Homer 08/07/2019, 12:47 PM

## 2019-08-07 NOTE — Progress Notes (Signed)
Physical Therapy Treatment Patient Details Name: Danny Carpenter MRN: [id number] DOB: 12/24/1950 Today's Date: 08/07/2019    History of Present Illness Pt is 69 y.o. male with medical history significant of HTN, HLD, T2DM, PAD s/p, left common femoral and profundofemoral endarterectomy with vein patch angioplasty, and L femoral to above knee popliteal artery bypass graft with reverse ipsilateral translocated saphenous vein 12/2018 and s/p L 5th digit amputation presents with worsening non healing ulcer of L foot. Admitted with L foot osteomyelitis/cellulitis with vascular and ortho consulted for possible surgery. Pt now s/p L TMA on 5/7 (WBAT through heel with post-op shoe).    PT Comments    Pt seen for re-evaluation following L TMA on 5/7. Pt moving very well overall. Cueing to maintain weight through heel on L. Limited distance ambulated as DPM's note on 5/8 stating "weightbearing to heel with post op shoe for bedside and bathroom only". Pt with no difficulties performing functional mobility this session. No LOB or need for physical assistance. PT will continue to f/u with pt acutely to progress mobility as tolerated and to ensure a safe d/c home.   Follow Up Recommendations  No PT follow up     Equipment Recommendations  None recommended by PT;Other (comment)(has rollator and was steady/safe with demo)    Recommendations for Other Services       Precautions / Restrictions Precautions Precautions: Fall Restrictions Weight Bearing Restrictions: Yes LLE Weight Bearing: Weight bearing as tolerated Other Position/Activity Restrictions: WBAT through heel with post-op shoe    Mobility  Bed Mobility Overal bed mobility: Needs Assistance Bed Mobility: Supine to Sit;Sit to Supine     Supine to sit: Supervision Sit to supine: Supervision      Transfers Overall transfer level: Needs assistance Equipment used: 4-wheeled walker Transfers: Sit to/from Stand Sit to Stand:  Supervision         General transfer comment: good technique utilized, steady with transitions  Ambulation/Gait Ambulation/Gait assistance: Min guard Gait Distance (Feet): 5 Feet Assistive device: Rolling walker (2 wheeled) Gait Pattern/deviations: Step-to pattern Gait velocity: decreased   General Gait Details: cueing to WB through heel; pt steady with use of rollator; no LOB or need for physical assistance, min guard for line management   Stairs             Wheelchair Mobility    Modified Rankin (Stroke Patients Only)       Balance Overall balance assessment: Needs assistance Sitting-balance support: No upper extremity supported;Feet unsupported Sitting balance-Leahy Scale: Good     Standing balance support: During functional activity Standing balance-Leahy Scale: Good                              Cognition Arousal/Alertness: Awake/alert Behavior During Therapy: WFL for tasks assessed/performed Overall Cognitive Status: Within Functional Limits for tasks assessed                                        Exercises      General Comments        Pertinent Vitals/Pain Pain Assessment: 0-10 Pain Score: 8  Pain Location: L foot Pain Descriptors / Indicators: Aching;Burning Pain Intervention(s): Monitored during session;Repositioned    Home Living                      Prior Function  PT Goals (current goals can now be found in the care plan section) Acute Rehab PT Goals PT Goal Formulation: With patient Time For Goal Achievement: 08/19/19 Potential to Achieve Goals: Good Progress towards PT goals: Progressing toward goals    Frequency    Min 3X/week      PT Plan Current plan remains appropriate    Co-evaluation              AM-PAC PT "6 Clicks" Mobility   Outcome Measure  Help needed turning from your back to your side while in a flat bed without using bedrails?: None Help needed  moving from lying on your back to sitting on the side of a flat bed without using bedrails?: None Help needed moving to and from a bed to a chair (including a wheelchair)?: None Help needed standing up from a chair using your arms (e.g., wheelchair or bedside chair)?: None Help needed to walk in hospital room?: None Help needed climbing 3-5 steps with a railing? : A Little 6 Click Score: 23    End of Session   Activity Tolerance: Patient tolerated treatment well Patient left: in bed;with call bell/phone within reach Nurse Communication: Mobility status PT Visit Diagnosis: Unsteadiness on feet (R26.81);Other abnormalities of gait and mobility (R26.89)     Time: 6433-2951 PT Time Calculation (min) (ACUTE ONLY): 12 min  Charges:                        Anastasio Champion, DPT  Acute Rehabilitation Services Pager [phone] Office [place] 08/07/2019, 3:28 PM

## 2019-08-07 NOTE — Plan of Care (Signed)

## 2019-08-08 LAB — CBC
HCT: 32.8 % — ABNORMAL LOW (ref 39.0–52.0)
Hemoglobin: 10.6 g/dL — ABNORMAL LOW (ref 13.0–17.0)
MCH: 28.5 pg (ref 26.0–34.0)
MCHC: 32.3 g/dL (ref 30.0–36.0)
MCV: 88.2 fL (ref 80.0–100.0)
Platelets: 360 10*3/uL (ref 150–400)
RBC: 3.72 MIL/uL — ABNORMAL LOW (ref 4.22–5.81)
RDW: 12.4 % (ref 11.5–15.5)
WBC: 6.7 10*3/uL (ref 4.0–10.5)
nRBC: 0 % (ref 0.0–0.2)

## 2019-08-08 LAB — COMPREHENSIVE METABOLIC PANEL
ALT: 33 U/L (ref 0–44)
AST: 15 U/L (ref 15–41)
Albumin: 2.3 g/dL — ABNORMAL LOW (ref 3.5–5.0)
Alkaline Phosphatase: 74 U/L (ref 38–126)
Anion gap: 9 (ref 5–15)
BUN: 12 mg/dL (ref 8–23)
CO2: 26 mmol/L (ref 22–32)
Calcium: 8.9 mg/dL (ref 8.9–10.3)
Chloride: 97 mmol/L — ABNORMAL LOW (ref 98–111)
Creatinine, Ser: 1.08 mg/dL (ref 0.61–1.24)
GFR calc Af Amer: >60 mL/min (ref >60)
GFR calc non Af Amer: >60 mL/min (ref >60)
Glucose, Bld: 335 mg/dL — ABNORMAL HIGH (ref 70–99)
Potassium: 4.8 mmol/L (ref 3.5–5.1)
Sodium: 132 mmol/L — ABNORMAL LOW (ref 135–145)
Total Bilirubin: 0.8 mg/dL (ref 0.3–1.2)
Total Protein: 6.6 g/dL (ref 6.5–8.1)

## 2019-08-08 LAB — GLUCOSE, CAPILLARY
Glucose-Capillary: 358 mg/dL — ABNORMAL HIGH (ref 70–99)
Glucose-Capillary: 315 mg/dL — ABNORMAL HIGH (ref 70–99)

## 2019-08-08 MED ORDER — OXYCODONE HCL 5 MG PO TABS: 5.0000 mg | ORAL_TABLET | ORAL | 0 refills | Status: AC | PRN

## 2019-08-08 NOTE — Progress Notes (Addendum)
Pharmacy Antibiotic Note  Danny Carpenter is a 69 y.o. male admitted on 08/04/2019 with acute L foot osteomyelitis and cellulitis.  Pharmacy has been consulted for vancomycin dosing. Patient is currently s/p transmetatarsal amputation 5/7. Unsure of clear margins. WBC wnl, scr stable, afebrile, today is day 4 of antibiotics. Continuing IV abx per primary / ortho team, looks like discharge patient soon with decision on oral abx. Bactrim vs. Augmentin. Patient's MRSA PCR is negative, cultures negative to date. Surgical culture NGTD <24 hours.   Likely could de-escalate off of vancomycin at this point with MRSA PCR negative. Would think augmentin slightly better choice and preferred over bactrim due to this negative MRSA PCR.    Plan: Continue zosyn 3.375g IV q8h Continue vancomycin 1250mg  IV q12h Follow-up cultures and sensitivities, PO antibiotic transition, de-escalation, and LOT.  Pharmacy will follow-along with you.  Height: 6\' 1"  (185.4 cm) Weight: 97.5 kg (215 lb) IBW/kg (Calculated) : 79.9  Temp (24hrs), Avg:98.3 F (36.8 C), Min:97.6 F (36.4 C), Max:98.7 F (37.1 C)  Recent Labs  Lab 08/04/19 1829 08/04/19 2212 08/05/19 0309 08/06/19 0401 08/07/19 0301 08/08/19 0352  WBC 8.7  --  7.2 6.8 7.9 6.7  CREATININE 1.28*  --  1.13 0.94 1.02 1.08  LATICACIDVEN 1.1 1.3  --   --   --   --     Estimated Creatinine Clearance: 81.6 mL/min (by C-G formula based on SCr of 1.08 mg/dL).    No Known Allergies  Antimicrobials this admission 5/5 ceftriaxone x1  5/6 vancomycin >>  5/6 zosyn >>   Dose adjustments this admission:   Microbiology results: 5/5 BCx: NGTD 5/7 SurgCx: NGTD <24h  5/7 MRSA PCR: neg 5/5 covid / flu: neg  Thank you for the interesting consult and for involving pharmacy in this patient's care.  Tamela Gammon, PharmD, BCPS 08/08/2019 8:01 AM PGY-2 Pharmacy Administration Resident Please check [email or web address] for unit-specific pharmacist phone numbers

## 2019-08-08 NOTE — Plan of Care (Signed)

## 2019-08-08 NOTE — Discharge Summary (Signed)
Physician Discharge Summary  Danny Carpenter WTU:[id number] DOB: Jul 28, 1950 DOA: 08/04/2019  PCP: Melony Overly, MD  Admit date: 08/04/2019 Discharge date: 08/08/2019  Admitted From: Home Disposition: Home  Recommendations for Outpatient Follow-up:  1. Follow up with PCP in 1-2 weeks 2. Follow-up with podiatry as scheduled later this week 3. Please obtain BMP/CBC in one week 4. Please follow up on the following pending results:  Discharge Condition: Stable CODE STATUS: Full Diet recommendation: Diabetic diet  Brief/Interim Summary: Danny Chavisis a 69 y.o.malewith medical history significant ofHTN, HLD, T2DM, PADs/p, left common femoral and profundofemoralendarterectomy with vein patch angioplasty, and L femoral to above knee popliteal artery bypass graft with reverse ipsilateral translocated saphenous vein 12/2018 and s/p L 5th digit amputation presents with worsening non healing ulcer of L foot. Patient presents for left foot ulcer. Patient has had having more imbalance last week and 1 week prior did have a fall leading him to the ground with minor soft bump to the head. No ongoing symptoms since that time no worsening gait ataxia. No focal deficits. He presents today with worsening ulcer and erythema with increased drainage from the left foot.He states the bone has been exposed now for probably at least 3 to 5 days.He has been bearing weight on this wearing appropriate shoe. He reports ever since amputation of the left fifth digit he has had difficulty with his gait. He denies other acute symptoms - no fevers, chills, sweats. No cough, sob, chest pain.  Patient met as above with left foot ulcer, failure of outpatient treatment, underwent transmetatarsal amputation on 08/06/2019 tolerating quite well.  Continues on Bactrim at discharge per podiatry, pain controlled well with oxycodone for breakthrough pain otherwise patient stable and agreeable for discharge home.  Close  follow-up with PCP and podiatry as scheduled for repeat labs and evaluation later this week.  Lengthy discussion at bedside about monitoring pain and wound for possible signs of infection and to not delay care if he has concerns given prolonged infection course previously.  Discharge Diagnoses:  Active Problems:   Diabetic foot infection ([place])   Subacute osteomyelitis of left foot (H[facility])   PVD (peripheral vascular disease) (H[facility])   PAD (peripheral artery disease) ([facility])   AAA (abdominal aortic aneurysm) without rupture ([place])   Osteomyelitis [facility])    Discharge Instructions  Discharge Instructions    Call MD for:  difficulty breathing, headache or visual disturbances   Complete by: As directed    Call MD for:  extreme fatigue   Complete by: As directed    Call MD for:  hives   Complete by: As directed    Call MD for:  persistant dizziness or light-headedness   Complete by: As directed    Call MD for:  persistant nausea and vomiting   Complete by: As directed    Call MD for:  redness, tenderness, or signs of infection (pain, swelling, redness, odor or green/yellow discharge around incision site)   Complete by: As directed    Call MD for:  severe uncontrolled pain   Complete by: As directed    Call MD for:  temperature >100.4   Complete by: As directed    Diet - low sodium heart healthy   Complete by: As directed    Increase activity slowly   Complete by: As directed      Allergies as of 08/08/2019   No Known Allergies     Medication List    STOP taking these medications   levETIRAcetam  500 MG tablet Commonly known as: KEPPRA   linezolid 600 MG tablet Commonly known as: ZYVOX     TAKE these medications   Align 4 MG Caps Take 4 mg by mouth daily.   amLODipine 10 MG tablet Commonly known as: NORVASC Take 1 tablet (10 mg total) by mouth daily.   Anoro Ellipta 62.5-25 MCG/INH Aepb Generic drug: umeclidinium-vilanterol Inhale 1 puff into the lungs daily.   carvedilol  40 MG 24 hr capsule Commonly known as: COREG CR Take 40 mg by mouth at bedtime.   clopidogrel 75 MG tablet Commonly known as: PLAVIX Take 75 mg by mouth daily.   collagenase ointment Commonly known as: SANTYL Apply 1 application topically daily. Apply for wound care   diflunisal 500 MG Tabs tablet Commonly known as: DOLOBID Take 500 mg by mouth 2 (two) times daily as needed (for pain management- take with food or milk).   DULoxetine 60 MG capsule Commonly known as: CYMBALTA Take 60 mg by mouth daily.   fenofibrate 160 MG tablet Take 160 mg by mouth daily.   fexofenadine 180 MG tablet Commonly known as: ALLEGRA Take 180 mg by mouth daily as needed for allergies or rhinitis.   Gralise 600 MG Tabs Generic drug: Gabapentin (Once-Daily) Take 1,200 mg by mouth at bedtime.   hydroxypropyl methylcellulose / hypromellose 2.5 % ophthalmic solution Commonly known as: ISOPTO TEARS / GONIOVISC Place 1 drop into both eyes daily as needed for dry eyes.   lisinopril 40 MG tablet Commonly known as: ZESTRIL Take 40 mg by mouth daily.   morphine 15 MG 12 hr tablet Commonly known as: MS CONTIN Take 15 mg by mouth every 12 (twelve) hours.   NovoLOG FlexPen 100 UNIT/ML FlexPen Generic drug: insulin aspart Inject 6-8 Units into the skin See admin instructions. Inject 6-8 units into the skin two times a day (morning and early afternoon) as needed, per sliding scale: BGL 300-350 = 6 units; 351-400 = 8 units; >400 = CALL STAYWELL   oxyCODONE 5 MG immediate release tablet Commonly known as: Oxy IR/ROXICODONE Take 1 tablet (5 mg total) by mouth every 4 (four) hours as needed for up to 3 days for moderate pain.   pantoprazole 40 MG tablet Commonly known as: PROTONIX Take 40 mg by mouth daily before breakfast.   riboflavin 100 MG Tabs tablet Commonly known as: VITAMIN B-2 Take 100 mg by mouth daily.   rosuvastatin 20 MG tablet Commonly known as: CRESTOR Take 20 mg by mouth daily.    sodium chloride 1 g tablet Take 2 g by mouth See admin instructions. Take 2 grams by mouth in the morning and 2 grams at bedtime   spironolactone 25 MG tablet Commonly known as: ALDACTONE Take 25 mg by mouth daily.   sulfamethoxazole-trimethoprim 800-160 MG tablet Commonly known as: BACTRIM DS Take 1 tablet by mouth 2 (two) times daily.   tamsulosin 0.4 MG Caps capsule Commonly known as: FLOMAX Take 0.4 mg by mouth at bedtime.   traZODone 100 MG tablet Commonly known as: DESYREL Take 100 mg by mouth at bedtime.   Vitamin D (Ergocalciferol) 1.25 MG (50000 UNIT) Caps capsule Commonly known as: DRISDOL Take 50,000 Units by mouth every 30 (thirty) days.   Xultophy 100-3.6 UNIT-MG/ML Sopn Generic drug: Insulin Degludec-Liraglutide Inject 22 Units into the skin daily before breakfast.       No Known Allergies  Consultations: Podiatry, orthopedics, vascular  Procedures/Studies: DG Foot 2 Views Left  Result Date: 08/06/2019 CLINICAL DATA:  Left foot mid tarsal amputation EXAM: LEFT FOOT - 2 VIEW COMPARISON:  08/04/2019 FINDINGS: Frontal and lateral views of the left foot demonstrate amputation of the left midfoot at the level of the proximal metatarsals. Postsurgical changes are seen within the soft tissues, with numerous skin staples. Chronic fractures are seen at the residual second through fourth metatarsals. IMPRESSION: 1. Postsurgical changes from left midfoot amputation as above. 2. Chronic posttraumatic changes at the base of the second through fourth metatarsals. Electronically Signed   By: Randa Ngo M.D.   On: 08/06/2019 20:37      Subjective: No acute issues or events overnight, pain well controlled denies fevers, chills, nausea, vomiting, diarrhea, constipation, headache.   Discharge Exam: Vitals:   08/08/19 0455 08/08/19 0823  BP: (!) 134/53 (!) 156/67  Pulse: 64 70  Resp: 18 17  Temp: 98.7 F (37.1 C) 98.6 F (37 C)  SpO2: 96% 96%   Vitals:    08/07/19 1300 08/07/19 1928 08/08/19 0455 08/08/19 0823  BP: (!) 154/53 (!) 143/62 (!) 134/53 (!) 156/67  Pulse: 70 65 64 70  Resp: 18 18 18 17   Temp: 98.2 F (36.8 C) 97.6 F (36.4 C) 98.7 F (37.1 C) 98.6 F (37 C)  TempSrc: Oral Oral Oral Oral  SpO2: 97% 99% 96% 96%  Weight:      Height:        General: Pt is alert, awake, not in acute distress Cardiovascular: RRR, S1/S2 +, no rubs, no gallops Respiratory: CTA bilaterally, no wheezing, no rhonchi Abdominal: Soft, NT, ND, bowel sounds + Extremities: no edema, no cyanosis left foot bandage clean dry intact   The results of significant diagnostics from this hospitalization (including imaging, microbiology, ancillary and laboratory) are listed below for reference.     Microbiology: Recent Results (from the past 240 hour(s))  Blood culture (routine x 2)     Status: None (Preliminary result)   Collection Time: 08/04/19  9:59 PM   Specimen: BLOOD  Result Value Ref Range Status   Specimen Description BLOOD RIGHT ANTECUBITAL  Final   Special Requests   Final    BOTTLES DRAWN AEROBIC AND ANAEROBIC Blood Culture adequate volume   Culture   Final    NO GROWTH 4 DAYS Performed at [facility], [address] [place], [place] [address]    Report Status PENDING  Incomplete  Blood culture (routine x 2)     Status: None (Preliminary result)   Collection Time: 08/04/19 10:09 PM   Specimen: BLOOD  Result Value Ref Range Status   Specimen Description BLOOD RIGHT ANTECUBITAL  Final   Special Requests   Final    AEROBIC BOTTLE ONLY Blood Culture results may not be optimal due to an inadequate volume of blood received in culture bottles   Culture   Final    NO GROWTH 4 DAYS Performed at [facility], [place] [address], [place], [place] [address]    Report Status PENDING  Incomplete  Respiratory Panel by RT PCR (Flu A&B, Covid) - Nasopharyngeal Swab     Status: None   Collection Time: 08/04/19 11:06 PM   Specimen:  Nasopharyngeal Swab  Result Value Ref Range Status   SARS Coronavirus 2 by RT PCR NEGATIVE NEGATIVE Final    Comment: (NOTE) SARS-CoV-2 target nucleic acids are NOT DETECTED. The SARS-CoV-2 RNA is generally detectable in upper respiratoy specimens during the acute phase of infection. The lowest concentration of SARS-CoV-2 viral copies this assay can detect  is 131 copies/mL. A negative result does not preclude SARS-Cov-2 infection and should not be used as the sole basis for treatment or other patient management decisions. A negative result may occur with  improper specimen collection/handling, submission of specimen other than nasopharyngeal swab, presence of viral mutation(s) within the areas targeted by this assay, and inadequate number of viral copies (<131 copies/mL). A negative result must be combined with clinical observations, patient history, and epidemiological information. The expected result is Negative. Fact Sheet for Patients:  PinkCheek.be Fact Sheet for Healthcare Providers:  GravelBags.it This test is not yet ap proved or cleared by the [place] FDA and  has been authorized for detection and/or diagnosis of SARS-CoV-2 by FDA under an Emergency Use Authorization (EUA). This EUA will remain  in effect (meaning this test can be used) for the duration of the COVID-19 declaration under Section 564(b)(1) of the Act, 21 U.S.C. section 360bbb-3(b)(1), unless the authorization is terminated or revoked sooner.    Influenza A by PCR NEGATIVE NEGATIVE Final   Influenza B by PCR NEGATIVE NEGATIVE Final    Comment: (NOTE) The Xpert Xpress SARS-CoV-2/FLU/RSV assay is intended as an aid in  the diagnosis of influenza from Nasopharyngeal swab specimens and  should not be used as a sole basis for treatment. Nasal washings and  aspirates are unacceptable for Xpert Xpress SARS-CoV-2/FLU/RSV  testing. Fact Sheet for  Patients: PinkCheek.be Fact Sheet for Healthcare Providers: GravelBags.it This test is not yet approved or cleared by the [place] FDA and  has been authorized for detection and/or diagnosis of SARS-CoV-2 by  FDA under an Emergency Use Authorization (EUA). This EUA will remain  in effect (meaning this test can be used) for the duration of the  Covid-19 declaration under Section 564(b)(1) of the Act, 21  U.S.C. section 360bbb-3(b)(1), unless the authorization is  terminated or revoked. Performed at [facility], [place] [address] [place], [place] [address]   Surgical pcr screen     Status: None   Collection Time: 08/05/19  1:28 PM   Specimen: Nasal Mucosa; Nasal Swab  Result Value Ref Range Status   MRSA, PCR NEGATIVE NEGATIVE Final   Staphylococcus aureus NEGATIVE NEGATIVE Final    Comment: (NOTE) The Xpert SA Assay (FDA approved for NASAL specimens in patients 76 years of age and older), is one component of a comprehensive surveillance program. It is not intended to diagnose infection nor to guide or monitor treatment. Performed at [facility], [place] [address], [place], [place] [address]   Aerobic/Anaerobic Culture (surgical/deep wound)     Status: None (Preliminary result)   Collection Time: 08/06/19  7:30 PM   Specimen: PATH Amputaion Arm/Leg; Tissue  Result Value Ref Range Status   Specimen Description FOOT LEFT AMPUTATION  Final   Special Requests NONE  Final   Gram Stain NO WBC SEEN NO ORGANISMS SEEN   Final   Culture   Final    NO GROWTH 2 DAYS Performed at [facility], [place] [address], [place], [place] [address]    Report Status PENDING  Incomplete     Labs: BNP (last 3 results) No results for input(s): BNP in the last 8760 hours. Basic Metabolic Panel: Recent Labs  Lab 08/04/19 1829 08/05/19 0309 08/06/19 0401 08/07/19 0301 08/08/19 0352  NA 125* 126* 132* 130* 132*  K 4.8  4.7 4.4 4.6 4.8  CL 92* 96* 100 96* 97*  CO2 22 23 22 24 26   GLUCOSE  390* 287* 190* 340* 335*  BUN 21 16 13 13 12   CREATININE 1.28* 1.13 0.94 1.02 1.08  CALCIUM 8.8* 8.5* 8.7* 8.8* 8.9   Liver Function Tests: Recent Labs  Lab 08/04/19 1829 08/06/19 0401 08/07/19 0301 08/08/19 0352  AST 66* 47* 35 15  ALT 66* 54* 46* 33  ALKPHOS 93 84 81 74  BILITOT 0.5 0.7 0.5 0.8  PROT 7.2 6.8 6.8 6.6  ALBUMIN 2.5* 2.3* 2.4* 2.3*   No results for input(s): LIPASE, AMYLASE in the last 168 hours. No results for input(s): AMMONIA in the last 168 hours. CBC: Recent Labs  Lab 08/04/19 1829 08/05/19 0309 08/06/19 0401 08/07/19 0301 08/08/19 0352  WBC 8.7 7.2 6.8 7.9 6.7  NEUTROABS 6.4  --   --   --   --   HGB 11.2* 10.9* 10.8* 11.2* 10.6*  HCT 34.5* 33.5* 33.4* 34.5* 32.8*  MCV 88.7 87.5 87.4 88.7 88.2  PLT 329 342 351 354 360   Cardiac Enzymes: No results for input(s): CKTOTAL, CKMB, CKMBINDEX, TROPONINI in the last 168 hours. BNP: Invalid input(s): POCBNP CBG: Recent Labs  Lab 08/07/19 0758 08/07/19 1227 08/07/19 1736 08/07/19 2049 08/08/19 0826  GLUCAP 333* 283* 364* 281* 358*   D-Dimer No results for input(s): DDIMER in the last 72 hours. Hgb A1c No results for input(s): HGBA1C in the last 72 hours. Lipid Profile No results for input(s): CHOL, HDL, LDLCALC, TRIG, CHOLHDL, LDLDIRECT in the last 72 hours. Thyroid function studies No results for input(s): TSH, T4TOTAL, T3FREE, THYROIDAB in the last 72 hours.  Invalid input(s): FREET3 Anemia work up No results for input(s): VITAMINB12, FOLATE, FERRITIN, TIBC, IRON, RETICCTPCT in the last 72 hours. Urinalysis    Component Value Date/Time   COLORURINE YELLOW 01/27/2019 1200   APPEARANCEUR HAZY (A) 01/27/2019 1200   LABSPEC 1.021 01/27/2019 1200   PHURINE 6.0 01/27/2019 1200   GLUCOSEU >=500 (A) 01/27/2019 1200   HGBUR NEGATIVE 01/27/2019 1200   BILIRUBINUR NEGATIVE 01/27/2019 1200   [place] 01/27/2019  1200   PROTEINUR 100 (A) 01/27/2019 1200   NITRITE NEGATIVE 01/27/2019 1200   LEUKOCYTESUR NEGATIVE 01/27/2019 1200   Sepsis Labs Invalid input(s): PROCALCITONIN,  WBC,  LACTICIDVEN Microbiology Recent Results (from the past 240 hour(s))  Blood culture (routine x 2)     Status: None (Preliminary result)   Collection Time: 08/04/19  9:59 PM   Specimen: BLOOD  Result Value Ref Range Status   Specimen Description BLOOD RIGHT ANTECUBITAL  Final   Special Requests   Final    BOTTLES DRAWN AEROBIC AND ANAEROBIC Blood Culture adequate volume   Culture   Final    NO GROWTH 4 DAYS Performed at [facility], [place] [address] [place], [place] [address]    Report Status PENDING  Incomplete  Blood culture (routine x 2)     Status: None (Preliminary result)   Collection Time: 08/04/19 10:09 PM   Specimen: BLOOD  Result Value Ref Range Status   Specimen Description BLOOD RIGHT ANTECUBITAL  Final   Special Requests   Final    AEROBIC BOTTLE ONLY Blood Culture results may not be optimal due to an inadequate volume of blood received in culture bottles   Culture   Final    NO GROWTH 4 DAYS Performed at [facility], [place] [address], [place], [place] [address]    Report Status PENDING  Incomplete  Respiratory Panel by RT PCR (Flu A&B, Covid) - Nasopharyngeal Swab  Status: None   Collection Time: 08/04/19 11:06 PM   Specimen: Nasopharyngeal Swab  Result Value Ref Range Status   SARS Coronavirus 2 by RT PCR NEGATIVE NEGATIVE Final    Comment: (NOTE) SARS-CoV-2 target nucleic acids are NOT DETECTED. The SARS-CoV-2 RNA is generally detectable in upper respiratoy specimens during the acute phase of infection. The lowest concentration of SARS-CoV-2 viral copies this assay can detect is 131 copies/mL. A negative result does not preclude SARS-Cov-2 infection and should not be used as the sole basis for treatment or other patient management decisions. A negative result may occur with   improper specimen collection/handling, submission of specimen other than nasopharyngeal swab, presence of viral mutation(s) within the areas targeted by this assay, and inadequate number of viral copies (<131 copies/mL). A negative result must be combined with clinical observations, patient history, and epidemiological information. The expected result is Negative. Fact Sheet for Patients:  PinkCheek.be Fact Sheet for Healthcare Providers:  GravelBags.it This test is not yet ap proved or cleared by the [place] FDA and  has been authorized for detection and/or diagnosis of SARS-CoV-2 by FDA under an Emergency Use Authorization (EUA). This EUA will remain  in effect (meaning this test can be used) for the duration of the COVID-19 declaration under Section 564(b)(1) of the Act, 21 U.S.C. section 360bbb-3(b)(1), unless the authorization is terminated or revoked sooner.    Influenza A by PCR NEGATIVE NEGATIVE Final   Influenza B by PCR NEGATIVE NEGATIVE Final    Comment: (NOTE) The Xpert Xpress SARS-CoV-2/FLU/RSV assay is intended as an aid in  the diagnosis of influenza from Nasopharyngeal swab specimens and  should not be used as a sole basis for treatment. Nasal washings and  aspirates are unacceptable for Xpert Xpress SARS-CoV-2/FLU/RSV  testing. Fact Sheet for Patients: PinkCheek.be Fact Sheet for Healthcare Providers: GravelBags.it This test is not yet approved or cleared by the [place] FDA and  has been authorized for detection and/or diagnosis of SARS-CoV-2 by  FDA under an Emergency Use Authorization (EUA). This EUA will remain  in effect (meaning this test can be used) for the duration of the  Covid-19 declaration under Section 564(b)(1) of the Act, 21  U.S.C. section 360bbb-3(b)(1), unless the authorization is  terminated or revoked. Performed at  [facility], [place] [address], [place], [place] [address]   Surgical pcr screen     Status: None   Collection Time: 08/05/19  1:28 PM   Specimen: Nasal Mucosa; Nasal Swab  Result Value Ref Range Status   MRSA, PCR NEGATIVE NEGATIVE Final   Staphylococcus aureus NEGATIVE NEGATIVE Final    Comment: (NOTE) The Xpert SA Assay (FDA approved for NASAL specimens in patients 84 years of age and older), is one component of a comprehensive surveillance program. It is not intended to diagnose infection nor to guide or monitor treatment. Performed at [facility], [place] [address], [place], [place] [address]   Aerobic/Anaerobic Culture (surgical/deep wound)     Status: None (Preliminary result)   Collection Time: 08/06/19  7:30 PM   Specimen: PATH Amputaion Arm/Leg; Tissue  Result Value Ref Range Status   Specimen Description FOOT LEFT AMPUTATION  Final   Special Requests NONE  Final   Gram Stain NO WBC SEEN NO ORGANISMS SEEN   Final   Culture   Final    NO GROWTH 2 DAYS Performed at [facility], [place] [address] [place], [place] [address]    Report  Status PENDING  Incomplete     Time coordinating discharge: Over 30 minutes  SIGNED:   Little Ishikawa, DO [facility] 08/08/2019, 11:33 AM Pager   If 7PM-7AM, please contact night-coverage [email or web address]

## 2019-08-08 NOTE — Progress Notes (Signed)
Pt given discharge instructions and gone over with him. He verbalized understanding. All belongings gathered to be sent home. Son driving pt home.

## 2019-08-08 NOTE — Progress Notes (Signed)
Occupational Therapy Treatment Patient Details Name: Danny Carpenter MRN: [id number] DOB: 12/22/1950 Today's Date: 08/08/2019    History of present illness Pt is 69 y.o. male with medical history significant of HTN, HLD, T2DM, PAD s/p, left common femoral and profundofemoral endarterectomy with vein patch angioplasty, and L femoral to above knee popliteal artery bypass graft with reverse ipsilateral translocated saphenous vein 12/2018 and s/p L 5th digit amputation presents with worsening non healing ulcer of L foot. Admitted with L foot osteomyelitis/cellulitis with vascular and ortho consulted for possible surgery. Pt now s/p L TMA on 5/7 (WBAT through heel with post-op shoe).   OT comments  Pt presents seated in recliner pleasant and willing to participate in therapy session. Session included education/review of safety and compensatory techniques for completing ADL tasks after return home. Pt verbalizing understanding throughout. Pt demonstrating functional transfers and short distance mobility using rollator at supervision - minguard assist level, min cues provided to ensure carryover of WB status throughout. He reports no significant concerns with ADL completion after return home and given available family assist at time of discharge. Pt anticipate d/c home today. Will continue per POC at this time.   Follow Up Recommendations  Home health OT;Supervision/Assistance - 24 hour    Equipment Recommendations  None recommended by OT          Precautions / Restrictions Precautions Precautions: Fall Restrictions Weight Bearing Restrictions: Yes LLE Weight Bearing: Weight bearing as tolerated Other Position/Activity Restrictions: WBAT through heel with post-op shoe       Mobility Bed Mobility               General bed mobility comments: received OOB in recliner   Transfers Overall transfer level: Needs assistance Equipment used: 4-wheeled walker Transfers: Sit to/from Stand Sit  to Stand: Supervision         General transfer comment: good technique utilized, steady with transitions    Balance Overall balance assessment: Needs assistance Sitting-balance support: No upper extremity supported;Feet unsupported Sitting balance-Leahy Scale: Good     Standing balance support: During functional activity Standing balance-Leahy Scale: Good                             ADL either performed or assessed with clinical judgement   ADL Overall ADL's : Needs assistance/impaired                       Lower Body Dressing Details (indicate cue type and reason): discussed safety/compensatory strategies for performing LB task - pt reports donning underwear this AM without difficulty            Tub/Shower Transfer Details (indicate cue type and reason): discussed likely need to sponge bathe initially to ensure L foot remains clean/dry - recommend pt await clearance from MD prior to full shower, pt verbalizing understanding  Functional mobility during ADLs: Min guard(rollator ) General ADL Comments: min cues required to ensure pt maintaining WB status      Vision       Perception     Praxis      Cognition Arousal/Alertness: Awake/alert Behavior During Therapy: WFL for tasks assessed/performed Overall Cognitive Status: Within Functional Limits for tasks assessed  Exercises     Shoulder Instructions       General Comments      Pertinent Vitals/ Pain       Pain Assessment: Faces Faces Pain Scale: Hurts a little bit Pain Location: L foot Pain Descriptors / Indicators: Aching;Burning Pain Intervention(s): Limited activity within patient's tolerance;Monitored during session  Home Living                                          Prior Functioning/Environment              Frequency  Min 2X/week        Progress Toward Goals  OT Goals(current goals can  now be found in the care plan section)  Progress towards OT goals: Progressing toward goals  Acute Rehab OT Goals Patient Stated Goal: home today OT Goal Formulation: With patient Time For Goal Achievement: 08/20/19 Potential to Achieve Goals: Good ADL Goals Pt Will Perform Grooming: with modified independence;standing Pt Will Perform Upper Body Bathing: Independently;sitting Pt Will Perform Lower Body Bathing: with modified independence;sit to/from stand Pt Will Perform Upper Body Dressing: Independently;sitting Pt Will Perform Lower Body Dressing: with modified independence;sit to/from stand Pt Will Transfer to Toilet: with modified independence;ambulating;grab bars Pt Will Perform Toileting - Clothing Manipulation and hygiene: with modified independence;sit to/from stand Additional ADL Goal #1: Pt will be independent in and OOB for basic ADLs  Plan Discharge plan remains appropriate    Co-evaluation                 AM-PAC OT "6 Clicks" Daily Activity     Outcome Measure   Help from another person eating meals?: None Help from another person taking care of personal grooming?: A Little Help from another person toileting, which includes using toliet, bedpan, or urinal?: A Little Help from another person bathing (including washing, rinsing, drying)?: A Little Help from another person to put on and taking off regular upper body clothing?: A Little Help from another person to put on and taking off regular lower body clothing?: A Little 6 Click Score: 19    End of Session Equipment Utilized During Treatment: Other (comment)(rollator)  OT Visit Diagnosis: Unsteadiness on feet (R26.81);Other abnormalities of gait and mobility (R26.89);Pain Pain - Right/Left: Left Pain - part of body: Ankle and joints of foot   Activity Tolerance Patient tolerated treatment well   Patient Left in chair;with call bell/phone within reach   Nurse Communication Mobility status         Time: 1205-1218 OT Time Calculation (min): 13 min  Charges: OT General Charges $OT Visit: 1 Visit OT Treatments $Self Care/Home Management : 8-22 mins  Lou Cal, OT Acute Rehabilitation Services Pager [phone] Office [place] 08/08/2019, 1:18 PM

## 2019-08-09 LAB — CULTURE, BLOOD (ROUTINE X 2)
Culture: NO GROWTH
Culture: NO GROWTH
Special Requests: ADEQUATE

## 2019-08-10 LAB — SURGICAL PATHOLOGY

## 2019-08-11 LAB — AEROBIC/ANAEROBIC CULTURE W GRAM STAIN (SURGICAL/DEEP WOUND)
Culture: NO GROWTH
Gram Stain: NONE SEEN

## 2019-08-13 ENCOUNTER — Ambulatory Visit (INDEPENDENT_AMBULATORY_CARE_PROVIDER_SITE_OTHER): Payer: Medicare Other | Admitting: Sports Medicine

## 2019-08-13 ENCOUNTER — Other Ambulatory Visit: Payer: Self-pay | Admitting: Sports Medicine

## 2019-08-13 ENCOUNTER — Ambulatory Visit (INDEPENDENT_AMBULATORY_CARE_PROVIDER_SITE_OTHER): Payer: Medicare Other

## 2019-08-13 ENCOUNTER — Other Ambulatory Visit: Payer: Self-pay

## 2019-08-13 ENCOUNTER — Encounter: Payer: Self-pay | Admitting: Sports Medicine

## 2019-08-13 DIAGNOSIS — E11610 Type 2 diabetes mellitus with diabetic neuropathic arthropathy: Secondary | ICD-10-CM

## 2019-08-13 DIAGNOSIS — M79672 Pain in left foot: Secondary | ICD-10-CM

## 2019-08-13 DIAGNOSIS — Z9889 Other specified postprocedural states: Secondary | ICD-10-CM

## 2019-08-13 DIAGNOSIS — L97521 Non-pressure chronic ulcer of other part of left foot limited to breakdown of skin: Secondary | ICD-10-CM | POA: Diagnosis not present

## 2019-08-13 DIAGNOSIS — E1169 Type 2 diabetes mellitus with other specified complication: Secondary | ICD-10-CM

## 2019-08-13 DIAGNOSIS — I739 Peripheral vascular disease, unspecified: Secondary | ICD-10-CM

## 2019-08-13 DIAGNOSIS — Z89432 Acquired absence of left foot: Secondary | ICD-10-CM

## 2019-08-13 NOTE — Progress Notes (Signed)
Subjective: Danny Carpenter is a 69 y.o. male patient seen today in office for POV #1 (DOS 08-06-19), S/P Left TMA. Patient denies pain at surgical site, reports that his Oxy 5 is helping with his pain, denies current calf pain, denies headache, chest pain, shortness of breath, nausea, vomiting, fever, or chills. Patient states that he is doing well except about the trip or fall in the shoe that they gave him from the hospital. No other issues noted.   Patient is assisted by son this visit.   Patient Active Problem List   Diagnosis Date Noted  . Osteomyelitis ([place]) 08/05/2019  . AAA (abdominal aortic aneurysm) without rupture ([place]) 06/11/2019  . Hyperlipidemia 06/11/2019  . PICC (peripherally inserted central catheter) in place 02/16/2019  . Encounter for medication management 02/16/2019  . PAD (peripheral artery disease) ([place]) 01/27/2019  . Type 2 diabetes mellitus with other specified complication ([place])   . Subacute osteomyelitis of left foot ([place])   . PVD (peripheral vascular disease) ([place])   . CVA (cerebral vascular accident) ([place]) 01/17/2019  . Diabetic foot infection ([place]) 01/17/2019  . Weight loss 03/19/2015  . Chest wall pain 02/06/2015  . Chronic fatigue 01/09/2015  . Eye pain 09/23/2014  . Pain medication agreement completed 02/06/2014  . Chronic pain 04/05/2013  . Senile nuclear sclerosis 12/06/2011  . Tear film insufficiency 12/06/2011  . Depression 01/07/2005  . DDD (degenerative disc disease) 10/08/2004  . Proteinuria 10/21/2002    Current Outpatient Medications on File Prior to Visit  Medication Sig Dispense Refill  . amLODipine (NORVASC) 10 MG tablet Take 1 tablet (10 mg total) by mouth daily. 30 tablet 0  . carvedilol (COREG CR) 40 MG 24 hr capsule Take 40 mg by mouth at bedtime.    . clopidogrel (PLAVIX) 75 MG tablet Take 75 mg by mouth daily.    . collagenase (SANTYL) ointment Apply 1 application topically daily. Apply for wound care    . diflunisal (DOLOBID) 500  MG TABS tablet Take 500 mg by mouth 2 (two) times daily as needed (for pain management- take with food or milk).    . DULoxetine (CYMBALTA) 60 MG capsule Take 60 mg by mouth daily.    . fenofibrate 160 MG tablet Take 160 mg by mouth daily.    . fexofenadine (ALLEGRA) 180 MG tablet Take 180 mg by mouth daily as needed for allergies or rhinitis.    . Gabapentin, Once-Daily, (GRALISE) 600 MG TABS Take 1,200 mg by mouth at bedtime.    . hydroxypropyl methylcellulose / hypromellose (ISOPTO TEARS / GONIOVISC) 2.5 % ophthalmic solution Place 1 drop into both eyes daily as needed for dry eyes.    . insulin aspart (NOVOLOG FLEXPEN) 100 UNIT/ML FlexPen Inject 6-8 Units into the skin See admin instructions. Inject 6-8 units into the skin two times a day (morning and early afternoon) as needed, per sliding scale: BGL 300-350 = 6 units; 351-400 = 8 units; >400 = CALL STAYWELL    . Insulin Degludec-Liraglutide (XULTOPHY) 100-3.6 UNIT-MG/ML SOPN Inject 22 Units into the skin daily before breakfast.     . lisinopril (ZESTRIL) 40 MG tablet Take 40 mg by mouth daily.    Marland Kitchen morphine (MS CONTIN) 15 MG 12 hr tablet Take 15 mg by mouth every 12 (twelve) hours.    . pantoprazole (PROTONIX) 40 MG tablet Take 40 mg by mouth daily before breakfast.    . Probiotic Product (ALIGN) 4 MG CAPS Take 4 mg by mouth daily.    Marland Kitchen  riboflavin (VITAMIN B-2) 100 MG TABS tablet Take 100 mg by mouth daily.    . rosuvastatin (CRESTOR) 20 MG tablet Take 20 mg by mouth daily.    . sodium chloride 1 g tablet Take 2 g by mouth See admin instructions. Take 2 grams by mouth in the morning and 2 grams at bedtime    . spironolactone (ALDACTONE) 25 MG tablet Take 25 mg by mouth daily.    Marland Kitchen sulfamethoxazole-trimethoprim (BACTRIM DS) 800-160 MG tablet Take 1 tablet by mouth 2 (two) times daily. 28 tablet 0  . tamsulosin (FLOMAX) 0.4 MG CAPS capsule Take 0.4 mg by mouth at bedtime.    . traZODone (DESYREL) 100 MG tablet Take 100 mg by mouth at bedtime.     Marland Kitchen umeclidinium-vilanterol (ANORO ELLIPTA) 62.5-25 MCG/INH AEPB Inhale 1 puff into the lungs daily.    . Vitamin D, Ergocalciferol, (DRISDOL) 1.25 MG (50000 UNIT) CAPS capsule Take 50,000 Units by mouth every 30 (thirty) days.     No current facility-administered medications on file prior to visit.    No Known Allergies  Objective: There were no vitals filed for this visit.  General: No acute distress, AAOx3  Left foot: Sutures and staples intact with no gapping or dehiscence at surgical site, mild maceration at plantar medial flap, swelling to stump site, with mild blanchable erythema, no warmth, no drainage, no signs of infection noted, CRT to stump intact. No pain or crepitation with range of motion left foot.  No pain with calf compression.   Post Op Xray, Left foot: Consistent with amputation status. Soft tissue swelling within normal limits for post op status.   Results for orders placed or performed during the hospital encounter of 08/04/19  Blood culture (routine x 2)     Status: None   Collection Time: 08/04/19  9:59 PM   Specimen: BLOOD  Result Value Ref Range Status   Specimen Description BLOOD RIGHT ANTECUBITAL  Final   Special Requests   Final    BOTTLES DRAWN AEROBIC AND ANAEROBIC Blood Culture adequate volume   Culture   Final    NO GROWTH 5 DAYS Performed at [facility], [address] [place], [place] [address]    Report Status 08/09/2019 FINAL  Final  Blood culture (routine x 2)     Status: None   Collection Time: 08/04/19 10:09 PM   Specimen: BLOOD  Result Value Ref Range Status   Specimen Description BLOOD RIGHT ANTECUBITAL  Final   Special Requests   Final    AEROBIC BOTTLE ONLY Blood Culture results may not be optimal due to an inadequate volume of blood received in culture bottles   Culture   Final    NO GROWTH 5 DAYS Performed at [facility], [place] [address], [place], [place] [address]    Report Status 08/09/2019 FINAL  Final   Respiratory Panel by RT PCR (Flu A&B, Covid) - Nasopharyngeal Swab     Status: None   Collection Time: 08/04/19 11:06 PM   Specimen: Nasopharyngeal Swab  Result Value Ref Range Status   SARS Coronavirus 2 by RT PCR NEGATIVE NEGATIVE Final    Comment: (NOTE) SARS-CoV-2 target nucleic acids are NOT DETECTED. The SARS-CoV-2 RNA is generally detectable in upper respiratoy specimens during the acute phase of infection. The lowest concentration of SARS-CoV-2 viral copies this assay can detect is 131 copies/mL. A negative result does not preclude SARS-Cov-2 infection and should not be used as the sole basis for  treatment or other patient management decisions. A negative result may occur with  improper specimen collection/handling, submission of specimen other than nasopharyngeal swab, presence of viral mutation(s) within the areas targeted by this assay, and inadequate number of viral copies (<131 copies/mL). A negative result must be combined with clinical observations, patient history, and epidemiological information. The expected result is Negative. Fact Sheet for Patients:  PinkCheek.be Fact Sheet for Healthcare Providers:  GravelBags.it This test is not yet ap proved or cleared by the [place] FDA and  has been authorized for detection and/or diagnosis of SARS-CoV-2 by FDA under an Emergency Use Authorization (EUA). This EUA will remain  in effect (meaning this test can be used) for the duration of the COVID-19 declaration under Section 564(b)(1) of the Act, 21 U.S.C. section 360bbb-3(b)(1), unless the authorization is terminated or revoked sooner.    Influenza A by PCR NEGATIVE NEGATIVE Final   Influenza B by PCR NEGATIVE NEGATIVE Final    Comment: (NOTE) The Xpert Xpress SARS-CoV-2/FLU/RSV assay is intended as an aid in  the diagnosis of influenza from Nasopharyngeal swab specimens and  should not be used as a sole  basis for treatment. Nasal washings and  aspirates are unacceptable for Xpert Xpress SARS-CoV-2/FLU/RSV  testing. Fact Sheet for Patients: PinkCheek.be Fact Sheet for Healthcare Providers: GravelBags.it This test is not yet approved or cleared by the [place] FDA and  has been authorized for detection and/or diagnosis of SARS-CoV-2 by  FDA under an Emergency Use Authorization (EUA). This EUA will remain  in effect (meaning this test can be used) for the duration of the  Covid-19 declaration under Section 564(b)(1) of the Act, 21  U.S.C. section 360bbb-3(b)(1), unless the authorization is  terminated or revoked. Performed at [facility], [place] [address], [place], [place] [address]   Surgical pcr screen     Status: None   Collection Time: 08/05/19  1:28 PM   Specimen: Nasal Mucosa; Nasal Swab  Result Value Ref Range Status   MRSA, PCR NEGATIVE NEGATIVE Final   Staphylococcus aureus NEGATIVE NEGATIVE Final    Comment: (NOTE) The Xpert SA Assay (FDA approved for NASAL specimens in patients 22 years of age and older), is one component of a comprehensive surveillance program. It is not intended to diagnose infection nor to guide or monitor treatment. Performed at [facility], [place] [address] [place], [place] [address]   Fungus Culture With Stain     Status: None (Preliminary result)   Collection Time: 08/06/19  7:30 PM   Specimen: PATH Amputaion Arm/Leg; Tissue  Result Value Ref Range Status   Fungus Stain Final report  Final    Comment: (NOTE) Performed At: [facility] [place], [place] [id number] Rush Farmer MD KK:[id number]    Fungus (Mycology) Culture PENDING  Incomplete   Fungal Source FOOT  Final    Comment: LEFT AMPUTATION Performed at [facility], [place] [address] [place], [place] [address]   Aerobic/Anaerobic Culture (surgical/deep wound)     Status: None    Collection Time: 08/06/19  7:30 PM   Specimen: PATH Amputaion Arm/Leg; Tissue  Result Value Ref Range Status   Specimen Description FOOT LEFT AMPUTATION  Final   Special Requests NONE  Final   Gram Stain NO WBC SEEN NO ORGANISMS SEEN   Final   Culture   Final    No growth aerobically or anaerobically. Performed at [facility], [place],  [place] [address]    Report Status 08/11/2019 FINAL  Final  Fungus Culture Result     Status: None   Collection Time: 08/06/19  7:30 PM  Result Value Ref Range Status   Result 1 Comment  Final    Comment: (NOTE) KOH/Calcofluor preparation:  no fungus observed. Performed At: [facility] [place], [place] [id number] Rush Farmer MD OZ:[id number]      Assessment and Plan:  Problem List Items Addressed This Visit      Cardiovascular and Mediastinum   PAD (peripheral artery disease) ([place])     Endocrine   Type 2 diabetes mellitus with other specified complication ([place])    Other Visit Diagnoses    S/P foot surgery, left    -  Primary   History of transmetatarsal amputation of left foot ([facility])       Left foot pain       Charcot foot due to diabetes mellitus ([place])           -Patient seen and evaluated -Xrays reviewed  -Applied dry sterile dressing to surgical site left foot secured with coban wrap and stockinet  -Advised patient to make sure to keep dressings clean, dry, and intact to left surgical site -Advised son if anything gets wet or comes off may replace with betadine and dry dressing -Continue with PO antibiotics until completed, intra op cultures were NEGATIVE  -Advised patient to continue with walker and post op shoe (traded with patient today, patient will bring in his old shoe next visit) -Advised patient to limit activity to necessity  -Advised patient to elevate to assist with edema control -Advised patient that his new PCP will work on keeping all of his medications refilled except  the meds that were Rx'd by me -Will plan for dressing change and incision site check at next office visit. In the meantime, patient to call office if any issues or problems arise.   Landis Martins, DPM

## 2019-08-20 ENCOUNTER — Ambulatory Visit (INDEPENDENT_AMBULATORY_CARE_PROVIDER_SITE_OTHER): Payer: Medicare Other | Admitting: Sports Medicine

## 2019-08-20 ENCOUNTER — Other Ambulatory Visit: Payer: Self-pay

## 2019-08-20 ENCOUNTER — Encounter: Payer: Self-pay | Admitting: Sports Medicine

## 2019-08-20 DIAGNOSIS — E11610 Type 2 diabetes mellitus with diabetic neuropathic arthropathy: Secondary | ICD-10-CM

## 2019-08-20 DIAGNOSIS — I739 Peripheral vascular disease, unspecified: Secondary | ICD-10-CM

## 2019-08-20 DIAGNOSIS — M79672 Pain in left foot: Secondary | ICD-10-CM

## 2019-08-20 DIAGNOSIS — Z89432 Acquired absence of left foot: Secondary | ICD-10-CM

## 2019-08-20 DIAGNOSIS — Z9889 Other specified postprocedural states: Secondary | ICD-10-CM

## 2019-08-20 MED ORDER — OXYCODONE HCL 5 MG PO CAPS: 5.0000 mg | ORAL_CAPSULE | Freq: Three times a day (TID) | ORAL | 0 refills | Status: AC

## 2019-08-20 NOTE — Progress Notes (Signed)
Subjective: Danny Carpenter is a 69 y.o. male patient seen today in office for POV #2 (DOS 08-06-19), S/P Left TMA. Patient denies pain at surgical site, reports that his Oxy 5 is helping with his pain but ran out and has been taking BC powder for foot pain and for pain all over, denies current calf pain, denies headache, chest pain, shortness of breath, nausea, vomiting, fever, or chills. No other issues noted.   Patient is assisted by son this visit.   Patient Active Problem List   Diagnosis Date Noted  . Osteomyelitis ([place]) 08/05/2019  . AAA (abdominal aortic aneurysm) without rupture ([place]) 06/11/2019  . Hyperlipidemia 06/11/2019  . PICC (peripherally inserted central catheter) in place 02/16/2019  . Encounter for medication management 02/16/2019  . PAD (peripheral artery disease) ([place]) 01/27/2019  . Type 2 diabetes mellitus with other specified complication ([place])   . Subacute osteomyelitis of left foot ([place])   . PVD (peripheral vascular disease) ([place])   . CVA (cerebral vascular accident) ([place]) 01/17/2019  . Diabetic foot infection ([place]) 01/17/2019  . Weight loss 03/19/2015  . Chest wall pain 02/06/2015  . Chronic fatigue 01/09/2015  . Eye pain 09/23/2014  . Pain medication agreement completed 02/06/2014  . Chronic pain 04/05/2013  . Senile nuclear sclerosis 12/06/2011  . Tear film insufficiency 12/06/2011  . Depression 01/07/2005  . DDD (degenerative disc disease) 10/08/2004  . Proteinuria 10/21/2002    Current Outpatient Medications on File Prior to Visit  Medication Sig Dispense Refill  . amLODipine (NORVASC) 10 MG tablet Take 1 tablet (10 mg total) by mouth daily. 30 tablet 0  . carvedilol (COREG CR) 40 MG 24 hr capsule Take 40 mg by mouth at bedtime.    . clopidogrel (PLAVIX) 75 MG tablet Take 75 mg by mouth daily.    . collagenase (SANTYL) ointment Apply 1 application topically daily. Apply for wound care    . diflunisal (DOLOBID) 500 MG TABS tablet Take 500 mg by mouth 2  (two) times daily as needed (for pain management- take with food or milk).    . DULoxetine (CYMBALTA) 60 MG capsule Take 60 mg by mouth daily.    . fenofibrate 160 MG tablet Take 160 mg by mouth daily.    . fexofenadine (ALLEGRA) 180 MG tablet Take 180 mg by mouth daily as needed for allergies or rhinitis.    . Gabapentin, Once-Daily, (GRALISE) 600 MG TABS Take 1,200 mg by mouth at bedtime.    . hydroxypropyl methylcellulose / hypromellose (ISOPTO TEARS / GONIOVISC) 2.5 % ophthalmic solution Place 1 drop into both eyes daily as needed for dry eyes.    . insulin aspart (NOVOLOG FLEXPEN) 100 UNIT/ML FlexPen Inject 6-8 Units into the skin See admin instructions. Inject 6-8 units into the skin two times a day (morning and early afternoon) as needed, per sliding scale: BGL 300-350 = 6 units; 351-400 = 8 units; >400 = CALL STAYWELL    . Insulin Degludec-Liraglutide (XULTOPHY) 100-3.6 UNIT-MG/ML SOPN Inject 22 Units into the skin daily before breakfast.     . lisinopril (ZESTRIL) 40 MG tablet Take 40 mg by mouth daily.    Marland Kitchen morphine (MS CONTIN) 15 MG 12 hr tablet Take 15 mg by mouth every 12 (twelve) hours.    . pantoprazole (PROTONIX) 40 MG tablet Take 40 mg by mouth daily before breakfast.    . Probiotic Product (ALIGN) 4 MG CAPS Take 4 mg by mouth daily.    . riboflavin (VITAMIN B-2) 100 MG  TABS tablet Take 100 mg by mouth daily.    . rosuvastatin (CRESTOR) 20 MG tablet Take 20 mg by mouth daily.    . sodium chloride 1 g tablet Take 2 g by mouth See admin instructions. Take 2 grams by mouth in the morning and 2 grams at bedtime    . spironolactone (ALDACTONE) 25 MG tablet Take 25 mg by mouth daily.    Marland Kitchen sulfamethoxazole-trimethoprim (BACTRIM DS) 800-160 MG tablet Take 1 tablet by mouth 2 (two) times daily. 28 tablet 0  . tamsulosin (FLOMAX) 0.4 MG CAPS capsule Take 0.4 mg by mouth at bedtime.    . traZODone (DESYREL) 100 MG tablet Take 100 mg by mouth at bedtime.    Marland Kitchen umeclidinium-vilanterol (ANORO  ELLIPTA) 62.5-25 MCG/INH AEPB Inhale 1 puff into the lungs daily.    . Vitamin D, Ergocalciferol, (DRISDOL) 1.25 MG (50000 UNIT) CAPS capsule Take 50,000 Units by mouth every 30 (thirty) days.     No current facility-administered medications on file prior to visit.    No Known Allergies  Objective: There were no vitals filed for this visit.  General: No acute distress, AAOx3  Left foot: Sutures and staples intact with no gapping or dehiscence at surgical site, mild maceration at plantar medial flap, swelling to stump site, with mild blanchable erythema, no warmth, no drainage, no signs of infection noted, CRT to stump intact. No pain or crepitation with range of motion left foot.  No pain with calf compression.   Post Op Xray, Left foot: Consistent with amputation status. Soft tissue swelling within normal limits for post op status.   Results for orders placed or performed during the hospital encounter of 08/04/19  Blood culture (routine x 2)     Status: None   Collection Time: 08/04/19  9:59 PM   Specimen: BLOOD  Result Value Ref Range Status   Specimen Description BLOOD RIGHT ANTECUBITAL  Final   Special Requests   Final    BOTTLES DRAWN AEROBIC AND ANAEROBIC Blood Culture adequate volume   Culture   Final    NO GROWTH 5 DAYS Performed at [facility], [address], [place], [place] [address]    Report Status 08/09/2019 FINAL  Final  Blood culture (routine x 2)     Status: None   Collection Time: 08/04/19 10:09 PM   Specimen: BLOOD  Result Value Ref Range Status   Specimen Description BLOOD RIGHT ANTECUBITAL  Final   Special Requests   Final    AEROBIC BOTTLE ONLY Blood Culture results may not be optimal due to an inadequate volume of blood received in culture bottles   Culture   Final    NO GROWTH 5 DAYS Performed at [facility], [place] [address] [place],  [address]    Report Status 08/09/2019 FINAL  Final  Respiratory Panel by RT PCR (Flu A&B, Covid)  - Nasopharyngeal Swab     Status: None   Collection Time: 08/04/19 11:06 PM   Specimen: Nasopharyngeal Swab  Result Value Ref Range Status   SARS Coronavirus 2 by RT PCR NEGATIVE NEGATIVE Final    Comment: (NOTE) SARS-CoV-2 target nucleic acids are NOT DETECTED. The SARS-CoV-2 RNA is generally detectable in upper respiratoy specimens during the acute phase of infection. The lowest concentration of SARS-CoV-2 viral copies this assay can detect is 131 copies/mL. A negative result does not preclude SARS-Cov-2 infection and should not be used as the sole basis for treatment or other patient management  decisions. A negative result may occur with  improper specimen collection/handling, submission of specimen other than nasopharyngeal swab, presence of viral mutation(s) within the areas targeted by this assay, and inadequate number of viral copies (<131 copies/mL). A negative result must be combined with clinical observations, patient history, and epidemiological information. The expected result is Negative. Fact Sheet for Patients:  PinkCheek.be Fact Sheet for Healthcare Providers:  GravelBags.it This test is not yet ap proved or cleared by the [place] FDA and  has been authorized for detection and/or diagnosis of SARS-CoV-2 by FDA under an Emergency Use Authorization (EUA). This EUA will remain  in effect (meaning this test can be used) for the duration of the COVID-19 declaration under Section 564(b)(1) of the Act, 21 U.S.C. section 360bbb-3(b)(1), unless the authorization is terminated or revoked sooner.    Influenza A by PCR NEGATIVE NEGATIVE Final   Influenza B by PCR NEGATIVE NEGATIVE Final    Comment: (NOTE) The Xpert Xpress SARS-CoV-2/FLU/RSV assay is intended as an aid in  the diagnosis of influenza from Nasopharyngeal swab specimens and  should not be used as a sole basis for treatment. Nasal washings and   aspirates are unacceptable for Xpert Xpress SARS-CoV-2/FLU/RSV  testing. Fact Sheet for Patients: PinkCheek.be Fact Sheet for Healthcare Providers: GravelBags.it This test is not yet approved or cleared by the [place] FDA and  has been authorized for detection and/or diagnosis of SARS-CoV-2 by  FDA under an Emergency Use Authorization (EUA). This EUA will remain  in effect (meaning this test can be used) for the duration of the  Covid-19 declaration under Section 564(b)(1) of the Act, 21  U.S.C. section 360bbb-3(b)(1), unless the authorization is  terminated or revoked. Performed at [facility], [place] [address], [place], [place] [address]   Surgical pcr screen     Status: None   Collection Time: 08/05/19  1:28 PM   Specimen: Nasal Mucosa; Nasal Swab  Result Value Ref Range Status   MRSA, PCR NEGATIVE NEGATIVE Final   Staphylococcus aureus NEGATIVE NEGATIVE Final    Comment: (NOTE) The Xpert SA Assay (FDA approved for NASAL specimens in patients 48 years of age and older), is one component of a comprehensive surveillance program. It is not intended to diagnose infection nor to guide or monitor treatment. Performed at [facility], [place] [address], [place], [place] [address]   Fungus Culture With Stain     Status: None (Preliminary result)   Collection Time: 08/06/19  7:30 PM   Specimen: PATH Amputaion Arm/Leg; Tissue  Result Value Ref Range Status   Fungus Stain Final report  Final    Comment: (NOTE) Performed At: [facility] [place], [place] [id number] Rush Farmer MD OJ:[id number]    Fungus (Mycology) Culture PENDING  Incomplete   Fungal Source FOOT  Final    Comment: LEFT AMPUTATION Performed at [facility], [place] [address], [place], [place] [address]   Aerobic/Anaerobic Culture (surgical/deep wound)     Status: None   Collection Time: 08/06/19  7:30 PM    Specimen: PATH Amputaion Arm/Leg; Tissue  Result Value Ref Range Status   Specimen Description FOOT LEFT AMPUTATION  Final   Special Requests NONE  Final   Gram Stain NO WBC SEEN NO ORGANISMS SEEN   Final   Culture   Final    No growth aerobically or anaerobically. Performed at [facility], [place] [address], [place], [place] [address]  Report Status 08/11/2019 FINAL  Final  Fungus Culture Result     Status: None   Collection Time: 08/06/19  7:30 PM  Result Value Ref Range Status   Result 1 Comment  Final    Comment: (NOTE) KOH/Calcofluor preparation:  no fungus observed. Performed At: [facility] [place], [place] [id number] Rush Farmer MD QD:[id number]      Assessment and Plan:  Problem List Items Addressed This Visit      Cardiovascular and Mediastinum   PAD (peripheral artery disease) ([place])    Other Visit Diagnoses    S/P foot surgery, left    -  Primary   History of transmetatarsal amputation of left foot (HCC)       Left foot pain       Charcot foot due to diabetes mellitus (HCC)       Relevant Medications   oxycodone (OXY-IR) 5 MG capsule       -Patient seen and evaluated -Applied dry sterile dressing to surgical site left foot secured with coban wrap and stockinet  -Advised patient to make sure to keep dressings clean, dry, and intact to left surgical site until next week, son or daughter may redress with betadine and dry dressing on left next week -Advised patient to continue with walker and post op shoe for stability  -Advised patient to limit activity to necessity  -Advised patient to elevate to assist with edema control -Refilled pain medication Oxycodone 5mg  IR -Will plan for possible suture removal at next office visit. In the meantime, patient to call office if any issues or problems arise.   Landis Martins, DPM

## 2019-08-26 ENCOUNTER — Encounter: Payer: Medicare Other | Admitting: Sports Medicine

## 2019-09-02 ENCOUNTER — Encounter: Payer: Medicare Other | Admitting: Sports Medicine

## 2019-09-03 ENCOUNTER — Encounter: Payer: Self-pay | Admitting: Sports Medicine

## 2019-09-03 ENCOUNTER — Ambulatory Visit (INDEPENDENT_AMBULATORY_CARE_PROVIDER_SITE_OTHER): Payer: Medicare Other | Admitting: Sports Medicine

## 2019-09-03 ENCOUNTER — Other Ambulatory Visit: Payer: Self-pay

## 2019-09-03 DIAGNOSIS — E1169 Type 2 diabetes mellitus with other specified complication: Secondary | ICD-10-CM

## 2019-09-03 DIAGNOSIS — I739 Peripheral vascular disease, unspecified: Secondary | ICD-10-CM

## 2019-09-03 DIAGNOSIS — Z9889 Other specified postprocedural states: Secondary | ICD-10-CM

## 2019-09-03 DIAGNOSIS — M79672 Pain in left foot: Secondary | ICD-10-CM

## 2019-09-03 DIAGNOSIS — E11610 Type 2 diabetes mellitus with diabetic neuropathic arthropathy: Secondary | ICD-10-CM

## 2019-09-03 DIAGNOSIS — Z89432 Acquired absence of left foot: Secondary | ICD-10-CM

## 2019-09-03 MED ORDER — OXYCODONE HCL 5 MG PO CAPS: 5.0000 mg | ORAL_CAPSULE | Freq: Four times a day (QID) | ORAL | 0 refills | Status: AC | PRN

## 2019-09-03 NOTE — Progress Notes (Signed)
Subjective: Danny Carpenter is a 69 y.o. male patient seen today in office for POV # 3 (DOS 08-06-19), S/P Left TMA. Patient denies pain at surgical site, reports that he has pain in his hip and back but otherwise his foot is doing fine, denies current calf pain, denies headache, chest pain, shortness of breath, nausea, vomiting, fever, or chills. No other issues noted.   Fasting blood sugar not recorded  Patient is assisted by son this visit.   Patient Active Problem List   Diagnosis Date Noted  . Osteomyelitis ([place]) 08/05/2019  . AAA (abdominal aortic aneurysm) without rupture ([place]) 06/11/2019  . Hyperlipidemia 06/11/2019  . PICC (peripherally inserted central catheter) in place 02/16/2019  . Encounter for medication management 02/16/2019  . PAD (peripheral artery disease) ([place]) 01/27/2019  . Type 2 diabetes mellitus with other specified complication ([place])   . Subacute osteomyelitis of left foot ([place])   . PVD (peripheral vascular disease) ([place])   . CVA (cerebral vascular accident) ([place]) 01/17/2019  . Diabetic foot infection ([place]) 01/17/2019  . Weight loss 03/19/2015  . Chest wall pain 02/06/2015  . Chronic fatigue 01/09/2015  . Eye pain 09/23/2014  . Pain medication agreement completed 02/06/2014  . Chronic pain 04/05/2013  . Senile nuclear sclerosis 12/06/2011  . Tear film insufficiency 12/06/2011  . Depression 01/07/2005  . DDD (degenerative disc disease) 10/08/2004  . Proteinuria 10/21/2002    Current Outpatient Medications on File Prior to Visit  Medication Sig Dispense Refill  . amLODipine (NORVASC) 10 MG tablet Take 1 tablet (10 mg total) by mouth daily. 30 tablet 0  . carvedilol (COREG CR) 40 MG 24 hr capsule Take 40 mg by mouth at bedtime.    . clopidogrel (PLAVIX) 75 MG tablet Take 75 mg by mouth daily.    . collagenase (SANTYL) ointment Apply 1 application topically daily. Apply for wound care    . diflunisal (DOLOBID) 500 MG TABS tablet Take 500 mg by mouth 2 (two)  times daily as needed (for pain management- take with food or milk).    . DULoxetine (CYMBALTA) 60 MG capsule Take 60 mg by mouth daily.    . fenofibrate 160 MG tablet Take 160 mg by mouth daily.    . fexofenadine (ALLEGRA) 180 MG tablet Take 180 mg by mouth daily as needed for allergies or rhinitis.    . Gabapentin, Once-Daily, (GRALISE) 600 MG TABS Take 1,200 mg by mouth at bedtime.    . hydroxypropyl methylcellulose / hypromellose (ISOPTO TEARS / GONIOVISC) 2.5 % ophthalmic solution Place 1 drop into both eyes daily as needed for dry eyes.    . insulin aspart (NOVOLOG FLEXPEN) 100 UNIT/ML FlexPen Inject 6-8 Units into the skin See admin instructions. Inject 6-8 units into the skin two times a day (morning and early afternoon) as needed, per sliding scale: BGL 300-350 = 6 units; 351-400 = 8 units; >400 = CALL STAYWELL    . Insulin Degludec-Liraglutide (XULTOPHY) 100-3.6 UNIT-MG/ML SOPN Inject 22 Units into the skin daily before breakfast.     . lisinopril (ZESTRIL) 40 MG tablet Take 40 mg by mouth daily.    Marland Kitchen morphine (MS CONTIN) 15 MG 12 hr tablet Take 15 mg by mouth every 12 (twelve) hours.    . pantoprazole (PROTONIX) 40 MG tablet Take 40 mg by mouth daily before breakfast.    . Probiotic Product (ALIGN) 4 MG CAPS Take 4 mg by mouth daily.    . riboflavin (VITAMIN B-2) 100 MG TABS tablet Take  100 mg by mouth daily.    . rosuvastatin (CRESTOR) 20 MG tablet Take 20 mg by mouth daily.    . sodium chloride 1 g tablet Take 2 g by mouth See admin instructions. Take 2 grams by mouth in the morning and 2 grams at bedtime    . spironolactone (ALDACTONE) 25 MG tablet Take 25 mg by mouth daily.    Marland Kitchen sulfamethoxazole-trimethoprim (BACTRIM DS) 800-160 MG tablet Take 1 tablet by mouth 2 (two) times daily. 28 tablet 0  . tamsulosin (FLOMAX) 0.4 MG CAPS capsule Take 0.4 mg by mouth at bedtime.    . traZODone (DESYREL) 100 MG tablet Take 100 mg by mouth at bedtime.    Marland Kitchen umeclidinium-vilanterol (ANORO ELLIPTA)  62.5-25 MCG/INH AEPB Inhale 1 puff into the lungs daily.    . Vitamin D, Ergocalciferol, (DRISDOL) 1.25 MG (50000 UNIT) CAPS capsule Take 50,000 Units by mouth every 30 (thirty) days.     No current facility-administered medications on file prior to visit.    No Known Allergies  Objective: There were no vitals filed for this visit.  General: No acute distress, AAOx3  Left foot: Sutures and staples intact with no gapping or dehiscence at surgical site, mild maceration at plantar medial flap, swelling to stump site, with mild blanchable erythema, no warmth, no drainage, no signs of infection noted, CRT to stump intact. No pain or crepitation with range of motion left foot.  No pain with calf compression.   Results for orders placed or performed during the hospital encounter of 08/04/19  Blood culture (routine x 2)     Status: None   Collection Time: 08/04/19  9:59 PM   Specimen: BLOOD  Result Value Ref Range Status   Specimen Description BLOOD RIGHT ANTECUBITAL  Final   Special Requests   Final    BOTTLES DRAWN AEROBIC AND ANAEROBIC Blood Culture adequate volume   Culture   Final    NO GROWTH 5 DAYS Performed at [facility], [address] [place], [place] [address]    Report Status 08/09/2019 FINAL  Final  Blood culture (routine x 2)     Status: None   Collection Time: 08/04/19 10:09 PM   Specimen: BLOOD  Result Value Ref Range Status   Specimen Description BLOOD RIGHT ANTECUBITAL  Final   Special Requests   Final    AEROBIC BOTTLE ONLY Blood Culture results may not be optimal due to an inadequate volume of blood received in culture bottles   Culture   Final    NO GROWTH 5 DAYS Performed at [facility], [place] [address], [place], [place] [address]    Report Status 08/09/2019 FINAL  Final  Respiratory Panel by RT PCR (Flu A&B, Covid) - Nasopharyngeal Swab     Status: None   Collection Time: 08/04/19 11:06 PM   Specimen: Nasopharyngeal Swab  Result Value Ref Range  Status   SARS Coronavirus 2 by RT PCR NEGATIVE NEGATIVE Final    Comment: (NOTE) SARS-CoV-2 target nucleic acids are NOT DETECTED. The SARS-CoV-2 RNA is generally detectable in upper respiratoy specimens during the acute phase of infection. The lowest concentration of SARS-CoV-2 viral copies this assay can detect is 131 copies/mL. A negative result does not preclude SARS-Cov-2 infection and should not be used as the sole basis for treatment or other patient management decisions. A negative result may occur with  improper specimen collection/handling, submission of specimen other than nasopharyngeal swab, presence of viral mutation(s) within the  areas targeted by this assay, and inadequate number of viral copies (<131 copies/mL). A negative result must be combined with clinical observations, patient history, and epidemiological information. The expected result is Negative. Fact Sheet for Patients:  PinkCheek.be Fact Sheet for Healthcare Providers:  GravelBags.it This test is not yet ap proved or cleared by the [place] FDA and  has been authorized for detection and/or diagnosis of SARS-CoV-2 by FDA under an Emergency Use Authorization (EUA). This EUA will remain  in effect (meaning this test can be used) for the duration of the COVID-19 declaration under Section 564(b)(1) of the Act, 21 U.S.C. section 360bbb-3(b)(1), unless the authorization is terminated or revoked sooner.    Influenza A by PCR NEGATIVE NEGATIVE Final   Influenza B by PCR NEGATIVE NEGATIVE Final    Comment: (NOTE) The Xpert Xpress SARS-CoV-2/FLU/RSV assay is intended as an aid in  the diagnosis of influenza from Nasopharyngeal swab specimens and  should not be used as a sole basis for treatment. Nasal washings and  aspirates are unacceptable for Xpert Xpress SARS-CoV-2/FLU/RSV  testing. Fact Sheet for  Patients: PinkCheek.be Fact Sheet for Healthcare Providers: GravelBags.it This test is not yet approved or cleared by the [place] FDA and  has been authorized for detection and/or diagnosis of SARS-CoV-2 by  FDA under an Emergency Use Authorization (EUA). This EUA will remain  in effect (meaning this test can be used) for the duration of the  Covid-19 declaration under Section 564(b)(1) of the Act, 21  U.S.C. section 360bbb-3(b)(1), unless the authorization is  terminated or revoked. Performed at [facility], [place] [address], [place], [place] [address]   Surgical pcr screen     Status: None   Collection Time: 08/05/19  1:28 PM   Specimen: Nasal Mucosa; Nasal Swab  Result Value Ref Range Status   MRSA, PCR NEGATIVE NEGATIVE Final   Staphylococcus aureus NEGATIVE NEGATIVE Final    Comment: (NOTE) The Xpert SA Assay (FDA approved for NASAL specimens in patients 13 years of age and older), is one component of a comprehensive surveillance program. It is not intended to diagnose infection nor to guide or monitor treatment. Performed at [facility], [place] [address] [place], [place] [address]   Fungus Culture With Stain     Status: None (Preliminary result)   Collection Time: 08/06/19  7:30 PM   Specimen: PATH Amputaion Arm/Leg; Tissue  Result Value Ref Range Status   Fungus Stain Final report  Final    Comment: (NOTE) Performed At: [facility] [place], [place] [id number] Rush Farmer MD OJ:[id number]    Fungus (Mycology) Culture PENDING  Incomplete   Fungal Source FOOT  Final    Comment: LEFT AMPUTATION Performed at [facility], [place] [address], [place], [place] [address]   Aerobic/Anaerobic Culture (surgical/deep wound)     Status: None   Collection Time: 08/06/19  7:30 PM   Specimen: PATH Amputaion Arm/Leg; Tissue  Result Value Ref Range Status   Specimen Description  FOOT LEFT AMPUTATION  Final   Special Requests NONE  Final   Gram Stain NO WBC SEEN NO ORGANISMS SEEN   Final   Culture   Final    No growth aerobically or anaerobically. Performed at [facility], [place] [address], [place], [place] [address]    Report Status 08/11/2019 FINAL  Final  Fungus Culture Result     Status: None   Collection Time: 08/06/19  7:30 PM  Result Value Ref Range Status   Result 1 Comment  Final    Comment: (NOTE) KOH/Calcofluor preparation:  no fungus observed. Performed At: [facility] [place], [place] [id number] Rush Farmer MD JT:[id number]      Assessment and Plan:  Problem List Items Addressed This Visit      Cardiovascular and Mediastinum   PAD (peripheral artery disease) ([place])     Endocrine   Type 2 diabetes mellitus with other specified complication ([place])    Other Visit Diagnoses    S/P foot surgery, left    -  Primary   Relevant Medications   oxycodone (OXY-IR) 5 MG capsule   History of transmetatarsal amputation of left foot (HCC)       Left foot pain       Charcot foot due to diabetes mellitus (HCC)       Relevant Medications   oxycodone (OXY-IR) 5 MG capsule       -Patient seen and evaluated -Every other suture or staple was removed at this visit -Applied dry sterile dressing to surgical site left foot secured with coban wrap and stockinet  -Advised patient to make sure to keep dressings clean, dry, and intact to left surgical site until next week, son or daughter may redress with betadine and dry dressing on left next week like previous -Advised patient to continue with walker and post op shoe for stability  -Advised patient to limit activity to necessity  -Advised patient to elevate to assist with edema control like before -Refilled pain medication Oxycodone 5mg  IR -Will plan for finishing suture and staple removal at next office visit. In the meantime, patient to call office if any issues or problems  arise.   Landis Martins, DPM

## 2019-09-08 LAB — FUNGUS CULTURE WITH STAIN

## 2019-09-08 LAB — FUNGUS CULTURE RESULT

## 2019-09-08 LAB — FUNGAL ORGANISM REFLEX

## 2019-09-09 ENCOUNTER — Encounter: Payer: Medicare Other | Admitting: Sports Medicine

## 2019-09-16 ENCOUNTER — Ambulatory Visit (INDEPENDENT_AMBULATORY_CARE_PROVIDER_SITE_OTHER): Payer: Medicare Other | Admitting: Sports Medicine

## 2019-09-16 ENCOUNTER — Encounter: Payer: Self-pay | Admitting: Sports Medicine

## 2019-09-16 ENCOUNTER — Other Ambulatory Visit: Payer: Self-pay

## 2019-09-16 DIAGNOSIS — E1169 Type 2 diabetes mellitus with other specified complication: Secondary | ICD-10-CM

## 2019-09-16 DIAGNOSIS — Z9889 Other specified postprocedural states: Secondary | ICD-10-CM

## 2019-09-16 DIAGNOSIS — Z89432 Acquired absence of left foot: Secondary | ICD-10-CM

## 2019-09-16 DIAGNOSIS — E11610 Type 2 diabetes mellitus with diabetic neuropathic arthropathy: Secondary | ICD-10-CM

## 2019-09-16 DIAGNOSIS — M79672 Pain in left foot: Secondary | ICD-10-CM

## 2019-09-16 DIAGNOSIS — I739 Peripheral vascular disease, unspecified: Secondary | ICD-10-CM

## 2019-09-16 MED ORDER — OXYCODONE HCL 5 MG PO CAPS: 5.0000 mg | ORAL_CAPSULE | Freq: Four times a day (QID) | ORAL | 0 refills | Status: DC | PRN

## 2019-09-16 MED ORDER — OXYCODONE HCL 5 MG PO TABS: 5.0000 mg | ORAL_TABLET | Freq: Four times a day (QID) | ORAL | 0 refills | Status: DC | PRN

## 2019-09-16 NOTE — Progress Notes (Signed)
Subjective: Danny Carpenter is a 69 y.o. male patient seen today in office for POV # 4 (DOS 08-06-19), S/P Left TMA. Patient denies pain at surgical site, reports that he has pain in his foot and side fell and hit shoulder last week, denies current calf pain, denies headache, chest pain, shortness of breath, nausea, vomiting, fever, or chills. No other issues noted.   Fasting blood sugar 230  Patient is by himself this visit and reports that he drove himself here.  Patient Active Problem List   Diagnosis Date Noted  . Osteomyelitis ([place]) 08/05/2019  . AAA (abdominal aortic aneurysm) without rupture ([place]) 06/11/2019  . Hyperlipidemia 06/11/2019  . PICC (peripherally inserted central catheter) in place 02/16/2019  . Encounter for medication management 02/16/2019  . PAD (peripheral artery disease) ([place]) 01/27/2019  . Type 2 diabetes mellitus with other specified complication ([place])   . Subacute osteomyelitis of left foot ([place])   . PVD (peripheral vascular disease) ([place])   . CVA (cerebral vascular accident) ([place]) 01/17/2019  . Diabetic foot infection ([place]) 01/17/2019  . Weight loss 03/19/2015  . Chest wall pain 02/06/2015  . Chronic fatigue 01/09/2015  . Eye pain 09/23/2014  . Pain medication agreement completed 02/06/2014  . Chronic pain 04/05/2013  . Senile nuclear sclerosis 12/06/2011  . Tear film insufficiency 12/06/2011  . Depression 01/07/2005  . DDD (degenerative disc disease) 10/08/2004  . Proteinuria 10/21/2002    Current Outpatient Medications on File Prior to Visit  Medication Sig Dispense Refill  . amLODipine (NORVASC) 10 MG tablet Take 1 tablet (10 mg total) by mouth daily. 30 tablet 0  . carvedilol (COREG CR) 40 MG 24 hr capsule Take 40 mg by mouth at bedtime.    . clopidogrel (PLAVIX) 75 MG tablet Take 75 mg by mouth daily.    . collagenase (SANTYL) ointment Apply 1 application topically daily. Apply for wound care    . diflunisal (DOLOBID) 500 MG TABS tablet Take 500 mg  by mouth 2 (two) times daily as needed (for pain management- take with food or milk).    . DULoxetine (CYMBALTA) 60 MG capsule Take 60 mg by mouth daily.    . fenofibrate 160 MG tablet Take 160 mg by mouth daily.    . fexofenadine (ALLEGRA) 180 MG tablet Take 180 mg by mouth daily as needed for allergies or rhinitis.    . Gabapentin, Once-Daily, (GRALISE) 600 MG TABS Take 1,200 mg by mouth at bedtime.    . hydroxypropyl methylcellulose / hypromellose (ISOPTO TEARS / GONIOVISC) 2.5 % ophthalmic solution Place 1 drop into both eyes daily as needed for dry eyes.    . insulin aspart (NOVOLOG FLEXPEN) 100 UNIT/ML FlexPen Inject 6-8 Units into the skin See admin instructions. Inject 6-8 units into the skin two times a day (morning and early afternoon) as needed, per sliding scale: BGL 300-350 = 6 units; 351-400 = 8 units; >400 = CALL STAYWELL    . Insulin Degludec-Liraglutide (XULTOPHY) 100-3.6 UNIT-MG/ML SOPN Inject 22 Units into the skin daily before breakfast.     . lisinopril (ZESTRIL) 40 MG tablet Take 40 mg by mouth daily.    Marland Kitchen morphine (MS CONTIN) 15 MG 12 hr tablet Take 15 mg by mouth every 12 (twelve) hours.    . pantoprazole (PROTONIX) 40 MG tablet Take 40 mg by mouth daily before breakfast.    . Probiotic Product (ALIGN) 4 MG CAPS Take 4 mg by mouth daily.    . riboflavin (VITAMIN B-2) 100 MG  TABS tablet Take 100 mg by mouth daily.    . rosuvastatin (CRESTOR) 20 MG tablet Take 20 mg by mouth daily.    . sodium chloride 1 g tablet Take 2 g by mouth See admin instructions. Take 2 grams by mouth in the morning and 2 grams at bedtime    . spironolactone (ALDACTONE) 25 MG tablet Take 25 mg by mouth daily.    Marland Kitchen sulfamethoxazole-trimethoprim (BACTRIM DS) 800-160 MG tablet Take 1 tablet by mouth 2 (two) times daily. 28 tablet 0  . tamsulosin (FLOMAX) 0.4 MG CAPS capsule Take 0.4 mg by mouth at bedtime.    . traZODone (DESYREL) 100 MG tablet Take 100 mg by mouth at bedtime.    Marland Kitchen  umeclidinium-vilanterol (ANORO ELLIPTA) 62.5-25 MCG/INH AEPB Inhale 1 puff into the lungs daily.    . Vitamin D, Ergocalciferol, (DRISDOL) 1.25 MG (50000 UNIT) CAPS capsule Take 50,000 Units by mouth every 30 (thirty) days.     No current facility-administered medications on file prior to visit.    No Known Allergies  Objective: There were no vitals filed for this visit.  General: No acute distress, AAOx3  Left foot: Sutures and staples intact with no gapping or dehiscence at surgical site, swelling to stump site, with mild blanchable erythema, no warmth, no drainage, no signs of infection noted, CRT to stump intact. No pain or crepitation with range of motion left foot.  No pain with calf compression.   Results for orders placed or performed during the hospital encounter of 08/04/19  Blood culture (routine x 2)     Status: None   Collection Time: 08/04/19  9:59 PM   Specimen: BLOOD  Result Value Ref Range Status   Specimen Description BLOOD RIGHT ANTECUBITAL  Final   Special Requests   Final    BOTTLES DRAWN AEROBIC AND ANAEROBIC Blood Culture adequate volume   Culture   Final    NO GROWTH 5 DAYS Performed at [facility], [address] [place], [place] [address]    Report Status 08/09/2019 FINAL  Final  Blood culture (routine x 2)     Status: None   Collection Time: 08/04/19 10:09 PM   Specimen: BLOOD  Result Value Ref Range Status   Specimen Description BLOOD RIGHT ANTECUBITAL  Final   Special Requests   Final    AEROBIC BOTTLE ONLY Blood Culture results may not be optimal due to an inadequate volume of blood received in culture bottles   Culture   Final    NO GROWTH 5 DAYS Performed at [facility], [place] [address], [place], [place] [address]    Report Status 08/09/2019 FINAL  Final  Respiratory Panel by RT PCR (Flu A&B, Covid) - Nasopharyngeal Swab     Status: None   Collection Time: 08/04/19 11:06 PM   Specimen: Nasopharyngeal Swab  Result Value Ref Range  Status   SARS Coronavirus 2 by RT PCR NEGATIVE NEGATIVE Final    Comment: (NOTE) SARS-CoV-2 target nucleic acids are NOT DETECTED. The SARS-CoV-2 RNA is generally detectable in upper respiratoy specimens during the acute phase of infection. The lowest concentration of SARS-CoV-2 viral copies this assay can detect is 131 copies/mL. A negative result does not preclude SARS-Cov-2 infection and should not be used as the sole basis for treatment or other patient management decisions. A negative result may occur with  improper specimen collection/handling, submission of specimen other than nasopharyngeal swab, presence of viral mutation(s) within the areas targeted by  this assay, and inadequate number of viral copies (<131 copies/mL). A negative result must be combined with clinical observations, patient history, and epidemiological information. The expected result is Negative. Fact Sheet for Patients:  PinkCheek.be Fact Sheet for Healthcare Providers:  GravelBags.it This test is not yet ap proved or cleared by the [place] FDA and  has been authorized for detection and/or diagnosis of SARS-CoV-2 by FDA under an Emergency Use Authorization (EUA). This EUA will remain  in effect (meaning this test can be used) for the duration of the COVID-19 declaration under Section 564(b)(1) of the Act, 21 U.S.C. section 360bbb-3(b)(1), unless the authorization is terminated or revoked sooner.    Influenza A by PCR NEGATIVE NEGATIVE Final   Influenza B by PCR NEGATIVE NEGATIVE Final    Comment: (NOTE) The Xpert Xpress SARS-CoV-2/FLU/RSV assay is intended as an aid in  the diagnosis of influenza from Nasopharyngeal swab specimens and  should not be used as a sole basis for treatment. Nasal washings and  aspirates are unacceptable for Xpert Xpress SARS-CoV-2/FLU/RSV  testing. Fact Sheet for  Patients: PinkCheek.be Fact Sheet for Healthcare Providers: GravelBags.it This test is not yet approved or cleared by the [place] FDA and  has been authorized for detection and/or diagnosis of SARS-CoV-2 by  FDA under an Emergency Use Authorization (EUA). This EUA will remain  in effect (meaning this test can be used) for the duration of the  Covid-19 declaration under Section 564(b)(1) of the Act, 21  U.S.C. section 360bbb-3(b)(1), unless the authorization is  terminated or revoked. Performed at [facility], [place] [address] [place], [place] [address]   Surgical pcr screen     Status: None   Collection Time: 08/05/19  1:28 PM   Specimen: Nasal Mucosa; Nasal Swab  Result Value Ref Range Status   MRSA, PCR NEGATIVE NEGATIVE Final   Staphylococcus aureus NEGATIVE NEGATIVE Final    Comment: (NOTE) The Xpert SA Assay (FDA approved for NASAL specimens in patients 40 years of age and older), is one component of a comprehensive surveillance program. It is not intended to diagnose infection nor to guide or monitor treatment. Performed at [facility], [place] [address], [place], [place] [address]   Fungus Culture With Stain     Status: None   Collection Time: 08/06/19  7:30 PM   Specimen: PATH Amputaion Arm/Leg; Tissue  Result Value Ref Range Status   Fungus Stain Final report  Final   Fungus (Mycology) Culture Final report  Final    Comment: (NOTE) Performed At: [facility] [address] [place], [place] [id number] Rush Farmer MD [id number]    Fungal Source FOOT  Final    Comment: LEFT AMPUTATION Performed at [facility], [place] [address] [place], [place] [address]   Aerobic/Anaerobic Culture (surgical/deep wound)     Status: None   Collection Time: 08/06/19  7:30 PM   Specimen: PATH Amputaion Arm/Leg; Tissue  Result Value Ref Range Status   Specimen Description FOOT LEFT AMPUTATION   Final   Special Requests NONE  Final   Gram Stain NO WBC SEEN NO ORGANISMS SEEN   Final   Culture   Final    No growth aerobically or anaerobically. Performed at [facility], [place] [address], [place], [place] [address]    Report Status 08/11/2019 FINAL  Final  Fungus Culture Result     Status: None   Collection Time: 08/06/19  7:30 PM  Result Value Ref  Range Status   Result 1 Comment  Final    Comment: (NOTE) KOH/Calcofluor preparation:  no fungus observed. Performed At: [facility] [place], [place] [id number] Rush Farmer MD EX:[id number]   Fungal organism reflex     Status: None   Collection Time: 08/06/19  7:30 PM  Result Value Ref Range Status   Fungal result 1 Comment  Final    Comment: (NOTE) No yeast or mold isolated after 4 weeks. Performed At: [facility] [place], [place] [id number] Rush Farmer MD YQ:[id number]      Assessment and Plan:  Problem List Items Addressed This Visit      Cardiovascular and Mediastinum   PAD (peripheral artery disease) ([place])     Endocrine   Type 2 diabetes mellitus with other specified complication ([place])    Other Visit Diagnoses    S/P foot surgery, left    -  Primary   History of transmetatarsal amputation of left foot ([place])       Left foot pain       Charcot foot due to diabetes mellitus ([place])       Relevant Medications   oxyCODONE (OXY IR/ROXICODONE) 5 MG immediate release tablet       -Patient seen and evaluated -Staples were removed at this visit -Applied dry sterile dressing to surgical site left foot secured with coban wrap and stockinet  -Advised patient to make sure to keep dressings clean, dry, and intact to left surgical site until next week, son or daughter may redress with betadine and dry dressing on left next week like previous -Advised patient to continue with walker and post op shoe for stability  -Advised patient to limit activity to necessity   -Advised patient to elevate to assist with edema control like before -Refilled pain medication Oxycodone 5mg  IR to help with pain until he can get in to see his PCP at [facility] -Will plan for finishing suture removal at next office visit. In the meantime, patient to call office if any issues or problems arise.   Landis Martins, DPM

## 2019-09-17 ENCOUNTER — Encounter: Payer: Self-pay | Admitting: Surgery

## 2019-09-23 ENCOUNTER — Other Ambulatory Visit: Payer: Self-pay | Admitting: Sports Medicine

## 2019-09-23 ENCOUNTER — Telehealth: Payer: Self-pay | Admitting: Urology

## 2019-09-23 MED ORDER — OXYCODONE HCL 5 MG PO TABS: 5.0000 mg | ORAL_TABLET | Freq: Four times a day (QID) | ORAL | 0 refills | Status: AC | PRN

## 2019-09-23 NOTE — Telephone Encounter (Signed)
Tried contacting the pt to advise him of his RF and Dr's recommendations but voice mail is full and can not leave a message

## 2019-09-23 NOTE — Telephone Encounter (Signed)
Refilled pain medication and recommend for the burning pain for him to take an additional dose of his nerve medication of Gabapentin or Cymbalta to help

## 2019-09-23 NOTE — Progress Notes (Signed)
Refilled pain medciation

## 2019-09-23 NOTE — Telephone Encounter (Signed)
Pt called wanting a refill on his oxy and would like it sent to the [facility] in [place].  Pt also said he is burning really bad on the top of his foot right where his big toe was. Pt says he talked with you about it at his last appt and its getting worse. Please Advise. Thanks

## 2019-09-24 ENCOUNTER — Encounter: Payer: Self-pay | Admitting: Surgery

## 2019-09-30 ENCOUNTER — Encounter: Payer: Self-pay | Admitting: Sports Medicine

## 2019-09-30 ENCOUNTER — Ambulatory Visit (INDEPENDENT_AMBULATORY_CARE_PROVIDER_SITE_OTHER): Payer: Medicare Other | Admitting: Sports Medicine

## 2019-09-30 ENCOUNTER — Other Ambulatory Visit: Payer: Self-pay

## 2019-09-30 DIAGNOSIS — E11610 Type 2 diabetes mellitus with diabetic neuropathic arthropathy: Secondary | ICD-10-CM

## 2019-09-30 DIAGNOSIS — M79672 Pain in left foot: Secondary | ICD-10-CM

## 2019-09-30 DIAGNOSIS — Z9889 Other specified postprocedural states: Secondary | ICD-10-CM

## 2019-09-30 DIAGNOSIS — Z89432 Acquired absence of left foot: Secondary | ICD-10-CM

## 2019-09-30 NOTE — Progress Notes (Signed)
Subjective: Danny Carpenter is a 69 y.o. male patient seen today in office for POV # 5 (DOS 08-06-19), S/P Left TMA. Patient denies pain at surgical site, reports that his PCP has him on tramadol for the pain which is helping denies current calf pain, denies headache, chest pain, shortness of breath, nausea, vomiting, fever, or chills. No other issues noted.   Fasting blood sugar 230 like before  Patient is assisted by son this visit.  Patient Active Problem List   Diagnosis Date Noted  . Osteomyelitis ([place]) 08/05/2019  . AAA (abdominal aortic aneurysm) without rupture ([place]) 06/11/2019  . Hyperlipidemia 06/11/2019  . PICC (peripherally inserted central catheter) in place 02/16/2019  . Encounter for medication management 02/16/2019  . PAD (peripheral artery disease) ([place]) 01/27/2019  . Type 2 diabetes mellitus with other specified complication ([place])   . Subacute osteomyelitis of left foot ([place])   . PVD (peripheral vascular disease) ([place])   . CVA (cerebral vascular accident) ([place]) 01/17/2019  . Diabetic foot infection ([place]) 01/17/2019  . Weight loss 03/19/2015  . Chest wall pain 02/06/2015  . Chronic fatigue 01/09/2015  . Eye pain 09/23/2014  . Pain medication agreement completed 02/06/2014  . Chronic pain 04/05/2013  . Senile nuclear sclerosis 12/06/2011  . Tear film insufficiency 12/06/2011  . Depression 01/07/2005  . DDD (degenerative disc disease) 10/08/2004  . Proteinuria 10/21/2002    Current Outpatient Medications on File Prior to Visit  Medication Sig Dispense Refill  . amLODipine (NORVASC) 10 MG tablet Take 1 tablet (10 mg total) by mouth daily. 30 tablet 0  . carvedilol (COREG CR) 40 MG 24 hr capsule Take 40 mg by mouth at bedtime.    . clopidogrel (PLAVIX) 75 MG tablet Take 75 mg by mouth daily.    . collagenase (SANTYL) ointment Apply 1 application topically daily. Apply for wound care    . diflunisal (DOLOBID) 500 MG TABS tablet Take 500 mg by mouth 2 (two) times daily  as needed (for pain management- take with food or milk).    . DULoxetine (CYMBALTA) 60 MG capsule Take 60 mg by mouth daily.    . fenofibrate 160 MG tablet Take 160 mg by mouth daily.    . fexofenadine (ALLEGRA) 180 MG tablet Take 180 mg by mouth daily as needed for allergies or rhinitis.    Marland Kitchen gabapentin (NEURONTIN) 600 MG tablet Take 600 mg by mouth 3 (three) times daily as needed.    . Gabapentin, Once-Daily, (GRALISE) 600 MG TABS Take 1,200 mg by mouth at bedtime.    . hydroxypropyl methylcellulose / hypromellose (ISOPTO TEARS / GONIOVISC) 2.5 % ophthalmic solution Place 1 drop into both eyes daily as needed for dry eyes.    . insulin aspart (NOVOLOG FLEXPEN) 100 UNIT/ML FlexPen Inject 6-8 Units into the skin See admin instructions. Inject 6-8 units into the skin two times a day (morning and early afternoon) as needed, per sliding scale: BGL 300-350 = 6 units; 351-400 = 8 units; >400 = CALL STAYWELL    . Insulin Degludec-Liraglutide (XULTOPHY) 100-3.6 UNIT-MG/ML SOPN Inject 22 Units into the skin daily before breakfast.     . lisinopril (ZESTRIL) 40 MG tablet Take 40 mg by mouth daily.    Marland Kitchen morphine (MS CONTIN) 15 MG 12 hr tablet Take 15 mg by mouth every 12 (twelve) hours.    Marland Kitchen oxyCODONE (OXY IR/ROXICODONE) 5 MG immediate release tablet Take 1 tablet (5 mg total) by mouth every 6 (six) hours as needed for up  to 7 days for severe pain. 28 tablet 0  . pantoprazole (PROTONIX) 40 MG tablet Take 40 mg by mouth daily before breakfast.    . Probiotic Product (ALIGN) 4 MG CAPS Take 4 mg by mouth daily.    . riboflavin (VITAMIN B-2) 100 MG TABS tablet Take 100 mg by mouth daily.    . rosuvastatin (CRESTOR) 20 MG tablet Take 20 mg by mouth daily.    . sodium chloride 1 g tablet Take 2 g by mouth See admin instructions. Take 2 grams by mouth in the morning and 2 grams at bedtime    . spironolactone (ALDACTONE) 25 MG tablet Take 25 mg by mouth daily.    Marland Kitchen sulfamethoxazole-trimethoprim (BACTRIM DS) 800-160  MG tablet Take 1 tablet by mouth 2 (two) times daily. 28 tablet 0  . tamsulosin (FLOMAX) 0.4 MG CAPS capsule Take 0.4 mg by mouth at bedtime.    . traZODone (DESYREL) 100 MG tablet Take 100 mg by mouth at bedtime.    Marland Kitchen umeclidinium-vilanterol (ANORO ELLIPTA) 62.5-25 MCG/INH AEPB Inhale 1 puff into the lungs daily.    . Vitamin D, Ergocalciferol, (DRISDOL) 1.25 MG (50000 UNIT) CAPS capsule Take 50,000 Units by mouth every 30 (thirty) days.     No current facility-administered medications on file prior to visit.    No Known Allergies  Objective: There were no vitals filed for this visit.  General: No acute distress, AAOx3  Left foot: Remaining sutures intact with no gapping or dehiscence at surgical site, swelling to stump site, with mild blanchable erythema, no warmth, no drainage, no signs of infection noted, CRT to stump intact. No pain or crepitation with range of motion left foot.  No pain with calf compression.   Results for orders placed or performed during the hospital encounter of 08/04/19  Blood culture (routine x 2)     Status: None   Collection Time: 08/04/19  9:59 PM   Specimen: BLOOD  Result Value Ref Range Status   Specimen Description BLOOD RIGHT ANTECUBITAL  Final   Special Requests   Final    BOTTLES DRAWN AEROBIC AND ANAEROBIC Blood Culture adequate volume   Culture   Final    NO GROWTH 5 DAYS Performed at [facility], [address] [place], [place] [address]    Report Status 08/09/2019 FINAL  Final  Blood culture (routine x 2)     Status: None   Collection Time: 08/04/19 10:09 PM   Specimen: BLOOD  Result Value Ref Range Status   Specimen Description BLOOD RIGHT ANTECUBITAL  Final   Special Requests   Final    AEROBIC BOTTLE ONLY Blood Culture results may not be optimal due to an inadequate volume of blood received in culture bottles   Culture   Final    NO GROWTH 5 DAYS Performed at [facility], [place] [address], [place], [place] [address]     Report Status 08/09/2019 FINAL  Final  Respiratory Panel by RT PCR (Flu A&B, Covid) - Nasopharyngeal Swab     Status: None   Collection Time: 08/04/19 11:06 PM   Specimen: Nasopharyngeal Swab  Result Value Ref Range Status   SARS Coronavirus 2 by RT PCR NEGATIVE NEGATIVE Final    Comment: (NOTE) SARS-CoV-2 target nucleic acids are NOT DETECTED. The SARS-CoV-2 RNA is generally detectable in upper respiratoy specimens during the acute phase of infection. The lowest concentration of SARS-CoV-2 viral copies this assay can detect is 131 copies/mL. A negative  result does not preclude SARS-Cov-2 infection and should not be used as the sole basis for treatment or other patient management decisions. A negative result may occur with  improper specimen collection/handling, submission of specimen other than nasopharyngeal swab, presence of viral mutation(s) within the areas targeted by this assay, and inadequate number of viral copies (<131 copies/mL). A negative result must be combined with clinical observations, patient history, and epidemiological information. The expected result is Negative. Fact Sheet for Patients:  PinkCheek.be Fact Sheet for Healthcare Providers:  GravelBags.it This test is not yet ap proved or cleared by the [place] FDA and  has been authorized for detection and/or diagnosis of SARS-CoV-2 by FDA under an Emergency Use Authorization (EUA). This EUA will remain  in effect (meaning this test can be used) for the duration of the COVID-19 declaration under Section 564(b)(1) of the Act, 21 U.S.C. section 360bbb-3(b)(1), unless the authorization is terminated or revoked sooner.    Influenza A by PCR NEGATIVE NEGATIVE Final   Influenza B by PCR NEGATIVE NEGATIVE Final    Comment: (NOTE) The Xpert Xpress SARS-CoV-2/FLU/RSV assay is intended as an aid in  the diagnosis of influenza from Nasopharyngeal swab specimens  and  should not be used as a sole basis for treatment. Nasal washings and  aspirates are unacceptable for Xpert Xpress SARS-CoV-2/FLU/RSV  testing. Fact Sheet for Patients: PinkCheek.be Fact Sheet for Healthcare Providers: GravelBags.it This test is not yet approved or cleared by the [place] FDA and  has been authorized for detection and/or diagnosis of SARS-CoV-2 by  FDA under an Emergency Use Authorization (EUA). This EUA will remain  in effect (meaning this test can be used) for the duration of the  Covid-19 declaration under Section 564(b)(1) of the Act, 21  U.S.C. section 360bbb-3(b)(1), unless the authorization is  terminated or revoked. Performed at [facility], [place] [address], [place], [place] [address]   Surgical pcr screen     Status: None   Collection Time: 08/05/19  1:28 PM   Specimen: Nasal Mucosa; Nasal Swab  Result Value Ref Range Status   MRSA, PCR NEGATIVE NEGATIVE Final   Staphylococcus aureus NEGATIVE NEGATIVE Final    Comment: (NOTE) The Xpert SA Assay (FDA approved for NASAL specimens in patients 41 years of age and older), is one component of a comprehensive surveillance program. It is not intended to diagnose infection nor to guide or monitor treatment. Performed at [facility], [place] [address], [place], [place] [address]   Fungus Culture With Stain     Status: None   Collection Time: 08/06/19  7:30 PM   Specimen: PATH Amputaion Arm/Leg; Tissue  Result Value Ref Range Status   Fungus Stain Final report  Final   Fungus (Mycology) Culture Final report  Final    Comment: (NOTE) Performed At: [facility] [address] [place], [place] [id number] Rush Farmer MD [id number]    Fungal Source FOOT  Final    Comment: LEFT AMPUTATION Performed at [facility], [place] [address] [place], [place] [address]   Aerobic/Anaerobic Culture (surgical/deep wound)      Status: None   Collection Time: 08/06/19  7:30 PM   Specimen: PATH Amputaion Arm/Leg; Tissue  Result Value Ref Range Status   Specimen Description FOOT LEFT AMPUTATION  Final   Special Requests NONE  Final   Gram Stain NO WBC SEEN NO ORGANISMS SEEN   Final   Culture   Final    No  growth aerobically or anaerobically. Performed at [facility], [place] [address] [place], [place] [address]    Report Status 08/11/2019 FINAL  Final  Fungus Culture Result     Status: None   Collection Time: 08/06/19  7:30 PM  Result Value Ref Range Status   Result 1 Comment  Final    Comment: (NOTE) KOH/Calcofluor preparation:  no fungus observed. Performed At: [facility] [place], [place] [id number] Rush Farmer MD [id number]   Fungal organism reflex     Status: None   Collection Time: 08/06/19  7:30 PM  Result Value Ref Range Status   Fungal result 1 Comment  Final    Comment: (NOTE) No yeast or mold isolated after 4 weeks. Performed At: [facility] [place], [place] [id number] Rush Farmer MD [id number]      Assessment and Plan:  Problem List Items Addressed This Visit    None    Visit Diagnoses    S/P foot surgery, left    -  Primary   History of transmetatarsal amputation of left foot (HCC)       Left foot pain       Charcot foot due to diabetes mellitus ([place])       Relevant Medications   gabapentin (NEURONTIN) 600 MG tablet       -Patient seen and evaluated -All sutures were removed this visit -Applied dry sterile dressing to surgical site left foot secured with coban wrap and stockinet  -Advised patient to make sure to keep dressings clean, dry, and intact to left surgical site until next week, son or daughter may help remove dressing and allow patient to shower and then redressed with a clean dry sock -Advised patient to continue with walker for stability and post op shoe  -Advised patient to limit activity to  necessity  -Advised patient to elevate to assist with edema control like before -Will plan for amputation stump site check at next visit and arranging patient to have an appointment with Liliane Channel or [facility] for custom insole with toe filler on left. Landis Martins, DPM

## 2019-10-20 ENCOUNTER — Encounter: Payer: Medicare Other | Admitting: Sports Medicine

## 2019-10-20 ENCOUNTER — Other Ambulatory Visit: Payer: Self-pay | Admitting: Sports Medicine

## 2019-10-20 DIAGNOSIS — Z9889 Other specified postprocedural states: Secondary | ICD-10-CM

## 2019-11-02 ENCOUNTER — Ambulatory Visit (INDEPENDENT_AMBULATORY_CARE_PROVIDER_SITE_OTHER): Payer: Medicare Other | Admitting: Sports Medicine

## 2019-11-02 ENCOUNTER — Other Ambulatory Visit: Payer: Medicare Other

## 2019-11-02 ENCOUNTER — Encounter: Payer: Self-pay | Admitting: Sports Medicine

## 2019-11-02 ENCOUNTER — Other Ambulatory Visit: Payer: Self-pay

## 2019-11-02 DIAGNOSIS — I739 Peripheral vascular disease, unspecified: Secondary | ICD-10-CM

## 2019-11-02 DIAGNOSIS — M79672 Pain in left foot: Secondary | ICD-10-CM

## 2019-11-02 DIAGNOSIS — E11610 Type 2 diabetes mellitus with diabetic neuropathic arthropathy: Secondary | ICD-10-CM

## 2019-11-02 DIAGNOSIS — Z9889 Other specified postprocedural states: Secondary | ICD-10-CM

## 2019-11-02 DIAGNOSIS — Z89432 Acquired absence of left foot: Secondary | ICD-10-CM

## 2019-11-02 NOTE — Progress Notes (Signed)
Patient was briefly evaluated in the presence of foot orthotist [place].  Left foot amputation site is well-healed.  Very minimal swelling to stump advised patient to wear support socks that does not rub or cut into his leg.  Patient was casted today for diabetic shoes with custom insole and toe filler.  Patient to return to office to pick up shoes and insoles when available.

## 2019-12-24 ENCOUNTER — Ambulatory Visit (INDEPENDENT_AMBULATORY_CARE_PROVIDER_SITE_OTHER): Payer: Medicare Other | Admitting: Orthotics

## 2019-12-24 ENCOUNTER — Other Ambulatory Visit: Payer: Medicare Other | Admitting: Orthotics

## 2019-12-24 ENCOUNTER — Other Ambulatory Visit: Payer: Self-pay

## 2019-12-24 DIAGNOSIS — Z89432 Acquired absence of left foot: Secondary | ICD-10-CM | POA: Diagnosis not present

## 2019-12-24 DIAGNOSIS — M79672 Pain in left foot: Secondary | ICD-10-CM

## 2019-12-24 DIAGNOSIS — Z9889 Other specified postprocedural states: Secondary | ICD-10-CM | POA: Diagnosis not present

## 2019-12-24 DIAGNOSIS — I739 Peripheral vascular disease, unspecified: Secondary | ICD-10-CM | POA: Diagnosis not present

## 2019-12-24 DIAGNOSIS — E1169 Type 2 diabetes mellitus with other specified complication: Secondary | ICD-10-CM

## 2019-12-24 NOTE — Progress Notes (Signed)
Patient picked up L5000 Left and E5501 custons shoes and a5500 shoes.

## 2020-07-19 ENCOUNTER — Encounter: Payer: Self-pay | Admitting: Cardiology

## 2020-07-19 ENCOUNTER — Encounter: Payer: Self-pay | Admitting: *Deleted

## 2020-07-19 DIAGNOSIS — R079 Chest pain, unspecified: Secondary | ICD-10-CM | POA: Diagnosis not present

## 2020-07-20 ENCOUNTER — Inpatient Hospital Stay (HOSPITAL_COMMUNITY)
Admission: EM | Admit: 2020-07-20 | Discharge: 2020-08-30 | DRG: 003 | Disposition: E | Payer: No Typology Code available for payment source | Source: Ambulatory Visit | Attending: Cardiothoracic Surgery | Admitting: Cardiothoracic Surgery

## 2020-07-20 ENCOUNTER — Emergency Department (HOSPITAL_COMMUNITY): Payer: No Typology Code available for payment source

## 2020-07-20 ENCOUNTER — Other Ambulatory Visit: Payer: Self-pay

## 2020-07-20 ENCOUNTER — Encounter (HOSPITAL_COMMUNITY): Payer: Self-pay | Admitting: Cardiology

## 2020-07-20 DIAGNOSIS — N186 End stage renal disease: Secondary | ICD-10-CM | POA: Diagnosis not present

## 2020-07-20 DIAGNOSIS — R52 Pain, unspecified: Secondary | ICD-10-CM | POA: Diagnosis not present

## 2020-07-20 DIAGNOSIS — R7401 Elevation of levels of liver transaminase levels: Secondary | ICD-10-CM | POA: Diagnosis not present

## 2020-07-20 DIAGNOSIS — R4182 Altered mental status, unspecified: Secondary | ICD-10-CM | POA: Diagnosis not present

## 2020-07-20 DIAGNOSIS — R509 Fever, unspecified: Secondary | ICD-10-CM | POA: Diagnosis not present

## 2020-07-20 DIAGNOSIS — Z01810 Encounter for preprocedural cardiovascular examination: Secondary | ICD-10-CM | POA: Diagnosis not present

## 2020-07-20 DIAGNOSIS — Z95828 Presence of other vascular implants and grafts: Secondary | ICD-10-CM

## 2020-07-20 DIAGNOSIS — I25700 Atherosclerosis of coronary artery bypass graft(s), unspecified, with unstable angina pectoris: Secondary | ICD-10-CM | POA: Diagnosis not present

## 2020-07-20 DIAGNOSIS — I252 Old myocardial infarction: Secondary | ICD-10-CM

## 2020-07-20 DIAGNOSIS — Y832 Surgical operation with anastomosis, bypass or graft as the cause of abnormal reaction of the patient, or of later complication, without mention of misadventure at the time of the procedure: Secondary | ICD-10-CM | POA: Diagnosis not present

## 2020-07-20 DIAGNOSIS — Z66 Do not resuscitate: Secondary | ICD-10-CM | POA: Diagnosis not present

## 2020-07-20 DIAGNOSIS — G928 Other toxic encephalopathy: Secondary | ICD-10-CM | POA: Diagnosis not present

## 2020-07-20 DIAGNOSIS — Z7984 Long term (current) use of oral hypoglycemic drugs: Secondary | ICD-10-CM

## 2020-07-20 DIAGNOSIS — J95851 Ventilator associated pneumonia: Secondary | ICD-10-CM | POA: Diagnosis not present

## 2020-07-20 DIAGNOSIS — A419 Sepsis, unspecified organism: Secondary | ICD-10-CM

## 2020-07-20 DIAGNOSIS — I255 Ischemic cardiomyopathy: Secondary | ICD-10-CM | POA: Diagnosis present

## 2020-07-20 DIAGNOSIS — J449 Chronic obstructive pulmonary disease, unspecified: Secondary | ICD-10-CM | POA: Diagnosis present

## 2020-07-20 DIAGNOSIS — K567 Ileus, unspecified: Secondary | ICD-10-CM | POA: Diagnosis not present

## 2020-07-20 DIAGNOSIS — I472 Ventricular tachycardia: Secondary | ICD-10-CM | POA: Diagnosis not present

## 2020-07-20 DIAGNOSIS — J9601 Acute respiratory failure with hypoxia: Secondary | ICD-10-CM | POA: Diagnosis not present

## 2020-07-20 DIAGNOSIS — E1122 Type 2 diabetes mellitus with diabetic chronic kidney disease: Secondary | ICD-10-CM | POA: Diagnosis present

## 2020-07-20 DIAGNOSIS — R0609 Other forms of dyspnea: Secondary | ICD-10-CM | POA: Diagnosis present

## 2020-07-20 DIAGNOSIS — M542 Cervicalgia: Secondary | ICD-10-CM | POA: Diagnosis present

## 2020-07-20 DIAGNOSIS — I071 Rheumatic tricuspid insufficiency: Secondary | ICD-10-CM | POA: Diagnosis present

## 2020-07-20 DIAGNOSIS — I214 Non-ST elevation (NSTEMI) myocardial infarction: Principal | ICD-10-CM

## 2020-07-20 DIAGNOSIS — I6340 Cerebral infarction due to embolism of unspecified cerebral artery: Secondary | ICD-10-CM | POA: Diagnosis not present

## 2020-07-20 DIAGNOSIS — Z515 Encounter for palliative care: Secondary | ICD-10-CM

## 2020-07-20 DIAGNOSIS — G931 Anoxic brain damage, not elsewhere classified: Secondary | ICD-10-CM | POA: Diagnosis not present

## 2020-07-20 DIAGNOSIS — Z9889 Other specified postprocedural states: Secondary | ICD-10-CM | POA: Diagnosis not present

## 2020-07-20 DIAGNOSIS — Z419 Encounter for procedure for purposes other than remedying health state, unspecified: Secondary | ICD-10-CM

## 2020-07-20 DIAGNOSIS — N179 Acute kidney failure, unspecified: Secondary | ICD-10-CM

## 2020-07-20 DIAGNOSIS — Z95811 Presence of heart assist device: Secondary | ICD-10-CM | POA: Diagnosis not present

## 2020-07-20 DIAGNOSIS — I700 Atherosclerosis of aorta: Secondary | ICD-10-CM | POA: Diagnosis present

## 2020-07-20 DIAGNOSIS — R002 Palpitations: Secondary | ICD-10-CM | POA: Diagnosis present

## 2020-07-20 DIAGNOSIS — I469 Cardiac arrest, cause unspecified: Secondary | ICD-10-CM | POA: Diagnosis not present

## 2020-07-20 DIAGNOSIS — I358 Other nonrheumatic aortic valve disorders: Secondary | ICD-10-CM | POA: Diagnosis present

## 2020-07-20 DIAGNOSIS — E1165 Type 2 diabetes mellitus with hyperglycemia: Secondary | ICD-10-CM | POA: Diagnosis present

## 2020-07-20 DIAGNOSIS — K810 Acute cholecystitis: Secondary | ICD-10-CM | POA: Diagnosis present

## 2020-07-20 DIAGNOSIS — Z452 Encounter for adjustment and management of vascular access device: Secondary | ICD-10-CM

## 2020-07-20 DIAGNOSIS — G8929 Other chronic pain: Secondary | ICD-10-CM | POA: Diagnosis present

## 2020-07-20 DIAGNOSIS — R570 Cardiogenic shock: Secondary | ICD-10-CM | POA: Diagnosis not present

## 2020-07-20 DIAGNOSIS — I509 Heart failure, unspecified: Secondary | ICD-10-CM

## 2020-07-20 DIAGNOSIS — D696 Thrombocytopenia, unspecified: Secondary | ICD-10-CM | POA: Diagnosis not present

## 2020-07-20 DIAGNOSIS — L8990 Pressure ulcer of unspecified site, unspecified stage: Secondary | ICD-10-CM | POA: Insufficient documentation

## 2020-07-20 DIAGNOSIS — K828 Other specified diseases of gallbladder: Secondary | ICD-10-CM | POA: Diagnosis present

## 2020-07-20 DIAGNOSIS — E874 Mixed disorder of acid-base balance: Secondary | ICD-10-CM | POA: Diagnosis not present

## 2020-07-20 DIAGNOSIS — Z930 Tracheostomy status: Secondary | ICD-10-CM

## 2020-07-20 DIAGNOSIS — E7800 Pure hypercholesterolemia, unspecified: Secondary | ICD-10-CM | POA: Diagnosis present

## 2020-07-20 DIAGNOSIS — Z794 Long term (current) use of insulin: Secondary | ICD-10-CM

## 2020-07-20 DIAGNOSIS — E669 Obesity, unspecified: Secondary | ICD-10-CM | POA: Diagnosis present

## 2020-07-20 DIAGNOSIS — I63433 Cerebral infarction due to embolism of bilateral posterior cerebral arteries: Secondary | ICD-10-CM | POA: Diagnosis not present

## 2020-07-20 DIAGNOSIS — I5043 Acute on chronic combined systolic (congestive) and diastolic (congestive) heart failure: Secondary | ICD-10-CM | POA: Diagnosis not present

## 2020-07-20 DIAGNOSIS — Z7189 Other specified counseling: Secondary | ICD-10-CM | POA: Diagnosis not present

## 2020-07-20 DIAGNOSIS — E870 Hyperosmolality and hypernatremia: Secondary | ICD-10-CM | POA: Diagnosis not present

## 2020-07-20 DIAGNOSIS — Z89422 Acquired absence of other left toe(s): Secondary | ICD-10-CM

## 2020-07-20 DIAGNOSIS — I10 Essential (primary) hypertension: Secondary | ICD-10-CM

## 2020-07-20 DIAGNOSIS — E875 Hyperkalemia: Secondary | ICD-10-CM | POA: Diagnosis not present

## 2020-07-20 DIAGNOSIS — K219 Gastro-esophageal reflux disease without esophagitis: Secondary | ICD-10-CM | POA: Diagnosis present

## 2020-07-20 DIAGNOSIS — K819 Cholecystitis, unspecified: Secondary | ICD-10-CM | POA: Diagnosis not present

## 2020-07-20 DIAGNOSIS — K3189 Other diseases of stomach and duodenum: Secondary | ICD-10-CM

## 2020-07-20 DIAGNOSIS — E119 Type 2 diabetes mellitus without complications: Secondary | ICD-10-CM | POA: Diagnosis not present

## 2020-07-20 DIAGNOSIS — J9811 Atelectasis: Secondary | ICD-10-CM | POA: Diagnosis present

## 2020-07-20 DIAGNOSIS — E785 Hyperlipidemia, unspecified: Secondary | ICD-10-CM | POA: Diagnosis present

## 2020-07-20 DIAGNOSIS — R778 Other specified abnormalities of plasma proteins: Secondary | ICD-10-CM

## 2020-07-20 DIAGNOSIS — R0600 Dyspnea, unspecified: Secondary | ICD-10-CM | POA: Diagnosis not present

## 2020-07-20 DIAGNOSIS — R609 Edema, unspecified: Secondary | ICD-10-CM | POA: Diagnosis not present

## 2020-07-20 DIAGNOSIS — Z951 Presence of aortocoronary bypass graft: Secondary | ICD-10-CM

## 2020-07-20 DIAGNOSIS — R6521 Severe sepsis with septic shock: Secondary | ICD-10-CM | POA: Diagnosis not present

## 2020-07-20 DIAGNOSIS — F419 Anxiety disorder, unspecified: Secondary | ICD-10-CM | POA: Diagnosis present

## 2020-07-20 DIAGNOSIS — Z7982 Long term (current) use of aspirin: Secondary | ICD-10-CM

## 2020-07-20 DIAGNOSIS — J90 Pleural effusion, not elsewhere classified: Secondary | ICD-10-CM | POA: Diagnosis not present

## 2020-07-20 DIAGNOSIS — Z8679 Personal history of other diseases of the circulatory system: Secondary | ICD-10-CM

## 2020-07-20 DIAGNOSIS — E1169 Type 2 diabetes mellitus with other specified complication: Secondary | ICD-10-CM | POA: Diagnosis not present

## 2020-07-20 DIAGNOSIS — R0603 Acute respiratory distress: Secondary | ICD-10-CM

## 2020-07-20 DIAGNOSIS — D62 Acute posthemorrhagic anemia: Secondary | ICD-10-CM | POA: Diagnosis not present

## 2020-07-20 DIAGNOSIS — K117 Disturbances of salivary secretion: Secondary | ICD-10-CM | POA: Diagnosis not present

## 2020-07-20 DIAGNOSIS — I4891 Unspecified atrial fibrillation: Secondary | ICD-10-CM | POA: Diagnosis not present

## 2020-07-20 DIAGNOSIS — Z89432 Acquired absence of left foot: Secondary | ICD-10-CM

## 2020-07-20 DIAGNOSIS — Z7902 Long term (current) use of antithrombotics/antiplatelets: Secondary | ICD-10-CM

## 2020-07-20 DIAGNOSIS — I110 Hypertensive heart disease with heart failure: Secondary | ICD-10-CM | POA: Diagnosis present

## 2020-07-20 DIAGNOSIS — J95821 Acute postprocedural respiratory failure: Secondary | ICD-10-CM | POA: Diagnosis not present

## 2020-07-20 DIAGNOSIS — Z8673 Personal history of transient ischemic attack (TIA), and cerebral infarction without residual deficits: Secondary | ICD-10-CM

## 2020-07-20 DIAGNOSIS — Z20822 Contact with and (suspected) exposure to covid-19: Secondary | ICD-10-CM | POA: Diagnosis present

## 2020-07-20 DIAGNOSIS — S301XXA Contusion of abdominal wall, initial encounter: Secondary | ICD-10-CM | POA: Diagnosis not present

## 2020-07-20 DIAGNOSIS — I639 Cerebral infarction, unspecified: Secondary | ICD-10-CM | POA: Diagnosis not present

## 2020-07-20 DIAGNOSIS — I5040 Unspecified combined systolic (congestive) and diastolic (congestive) heart failure: Secondary | ICD-10-CM | POA: Diagnosis not present

## 2020-07-20 DIAGNOSIS — I371 Nonrheumatic pulmonary valve insufficiency: Secondary | ICD-10-CM | POA: Diagnosis present

## 2020-07-20 DIAGNOSIS — Z809 Family history of malignant neoplasm, unspecified: Secondary | ICD-10-CM

## 2020-07-20 DIAGNOSIS — Z87891 Personal history of nicotine dependence: Secondary | ICD-10-CM

## 2020-07-20 DIAGNOSIS — I2510 Atherosclerotic heart disease of native coronary artery without angina pectoris: Secondary | ICD-10-CM | POA: Diagnosis present

## 2020-07-20 DIAGNOSIS — G9340 Encephalopathy, unspecified: Secondary | ICD-10-CM | POA: Diagnosis not present

## 2020-07-20 DIAGNOSIS — R140 Abdominal distension (gaseous): Secondary | ICD-10-CM

## 2020-07-20 DIAGNOSIS — R7989 Other specified abnormal findings of blood chemistry: Secondary | ICD-10-CM | POA: Diagnosis not present

## 2020-07-20 DIAGNOSIS — Z9911 Dependence on respirator [ventilator] status: Secondary | ICD-10-CM

## 2020-07-20 DIAGNOSIS — G4733 Obstructive sleep apnea (adult) (pediatric): Secondary | ICD-10-CM | POA: Diagnosis present

## 2020-07-20 DIAGNOSIS — I714 Abdominal aortic aneurysm, without rupture: Secondary | ICD-10-CM | POA: Diagnosis present

## 2020-07-20 DIAGNOSIS — Y848 Other medical procedures as the cause of abnormal reaction of the patient, or of later complication, without mention of misadventure at the time of the procedure: Secondary | ICD-10-CM | POA: Diagnosis not present

## 2020-07-20 DIAGNOSIS — N170 Acute kidney failure with tubular necrosis: Secondary | ICD-10-CM | POA: Diagnosis not present

## 2020-07-20 DIAGNOSIS — J9690 Respiratory failure, unspecified, unspecified whether with hypoxia or hypercapnia: Secondary | ICD-10-CM | POA: Diagnosis not present

## 2020-07-20 DIAGNOSIS — I351 Nonrheumatic aortic (valve) insufficiency: Secondary | ICD-10-CM | POA: Diagnosis present

## 2020-07-20 DIAGNOSIS — E876 Hypokalemia: Secondary | ICD-10-CM | POA: Diagnosis not present

## 2020-07-20 DIAGNOSIS — R571 Hypovolemic shock: Secondary | ICD-10-CM | POA: Diagnosis not present

## 2020-07-20 DIAGNOSIS — Z89022 Acquired absence of left finger(s): Secondary | ICD-10-CM

## 2020-07-20 DIAGNOSIS — I5021 Acute systolic (congestive) heart failure: Secondary | ICD-10-CM | POA: Diagnosis not present

## 2020-07-20 DIAGNOSIS — I97611 Postprocedural hemorrhage and hematoma of a circulatory system organ or structure following cardiac bypass: Secondary | ICD-10-CM | POA: Diagnosis not present

## 2020-07-20 DIAGNOSIS — R58 Hemorrhage, not elsewhere classified: Secondary | ICD-10-CM

## 2020-07-20 DIAGNOSIS — I493 Ventricular premature depolarization: Secondary | ICD-10-CM | POA: Diagnosis not present

## 2020-07-20 DIAGNOSIS — Z6837 Body mass index (BMI) 37.0-37.9, adult: Secondary | ICD-10-CM

## 2020-07-20 DIAGNOSIS — I5023 Acute on chronic systolic (congestive) heart failure: Secondary | ICD-10-CM | POA: Diagnosis not present

## 2020-07-20 DIAGNOSIS — E1151 Type 2 diabetes mellitus with diabetic peripheral angiopathy without gangrene: Secondary | ICD-10-CM | POA: Diagnosis present

## 2020-07-20 DIAGNOSIS — I25110 Atherosclerotic heart disease of native coronary artery with unstable angina pectoris: Secondary | ICD-10-CM | POA: Diagnosis not present

## 2020-07-20 DIAGNOSIS — Z79899 Other long term (current) drug therapy: Secondary | ICD-10-CM

## 2020-07-20 LAB — CBC
HCT: 40.9 % (ref 39.0–52.0)
HCT: 42.4 % (ref 39.0–52.0)
Hemoglobin: 12.9 g/dL — ABNORMAL LOW (ref 13.0–17.0)
Hemoglobin: 13.5 g/dL (ref 13.0–17.0)
MCH: 27.2 pg (ref 26.0–34.0)
MCH: 27.4 pg (ref 26.0–34.0)
MCHC: 31.5 g/dL (ref 30.0–36.0)
MCHC: 31.8 g/dL (ref 30.0–36.0)
MCV: 86.3 fL (ref 80.0–100.0)
MCV: 86 fL (ref 80.0–100.0)
Platelets: 236 10*3/uL (ref 150–400)
Platelets: 281 10*3/uL (ref 150–400)
RBC: 4.74 MIL/uL (ref 4.22–5.81)
RBC: 4.93 MIL/uL (ref 4.22–5.81)
RDW: 14.6 % (ref 11.5–15.5)
RDW: 14.8 % (ref 11.5–15.5)
WBC: 7 10*3/uL (ref 4.0–10.5)
WBC: 8 10*3/uL (ref 4.0–10.5)
nRBC: 0 % (ref 0.0–0.2)
nRBC: 0 % (ref 0.0–0.2)

## 2020-07-20 LAB — HEMOGLOBIN A1C
Hgb A1c MFr Bld: 11.6 % — ABNORMAL HIGH (ref 4.8–5.6)
Mean Plasma Glucose: 286.22 mg/dL

## 2020-07-20 LAB — TROPONIN I (HIGH SENSITIVITY)
Troponin I (High Sensitivity): 172 ng/L (ref <18)
Troponin I (High Sensitivity): 149 ng/L (ref <18)

## 2020-07-20 LAB — BASIC METABOLIC PANEL
Anion gap: 9 (ref 5–15)
BUN: 18 mg/dL (ref 8–23)
CO2: 23 mmol/L (ref 22–32)
Calcium: 8.8 mg/dL — ABNORMAL LOW (ref 8.9–10.3)
Chloride: 100 mmol/L (ref 98–111)
Creatinine, Ser: 1.11 mg/dL (ref 0.61–1.24)
GFR, Estimated: >60 mL/min (ref >60)
Glucose, Bld: 249 mg/dL — ABNORMAL HIGH (ref 70–99)
Potassium: 4.1 mmol/L (ref 3.5–5.1)
Sodium: 132 mmol/L — ABNORMAL LOW (ref 135–145)

## 2020-07-20 LAB — PROTIME-INR
INR: 1 (ref 0.8–1.2)
Prothrombin Time: 13 seconds (ref 11.4–15.2)

## 2020-07-20 LAB — CREATININE, SERUM
Creatinine, Ser: 1.14 mg/dL (ref 0.61–1.24)
GFR, Estimated: >60 mL/min (ref >60)

## 2020-07-20 LAB — HIV ANTIBODY (ROUTINE TESTING W REFLEX): HIV Screen 4th Generation wRfx: NONREACTIVE

## 2020-07-20 LAB — RESP PANEL BY RT-PCR (FLU A&B, COVID) ARPGX2
Influenza A by PCR: NEGATIVE
Influenza B by PCR: NEGATIVE
SARS Coronavirus 2 by RT PCR: NEGATIVE

## 2020-07-20 LAB — GLUCOSE, CAPILLARY: Glucose-Capillary: 267 mg/dL — ABNORMAL HIGH (ref 70–99)

## 2020-07-20 LAB — BRAIN NATRIURETIC PEPTIDE: B Natriuretic Peptide: 465.5 pg/mL — ABNORMAL HIGH (ref 0.0–100.0)

## 2020-07-20 MED ORDER — SODIUM CHLORIDE 0.9 % IV SOLN: 250.0000 mL | INTRAVENOUS | Status: DC | PRN

## 2020-07-20 MED ORDER — DULOXETINE HCL 30 MG PO CPEP
30.0000 mg | ORAL_CAPSULE | Freq: Every day | ORAL | Status: DC
  Administered 2020-07-21 – 2020-07-24 (×4): 30 mg via ORAL
  Filled 2020-07-20 (×4): qty 1

## 2020-07-20 MED ORDER — ASPIRIN EC 81 MG PO TBEC
81.0000 mg | DELAYED_RELEASE_TABLET | Freq: Every day | ORAL | Status: DC
  Administered 2020-07-22 – 2020-07-24 (×3): 81 mg via ORAL
  Filled 2020-07-20 (×3): qty 1

## 2020-07-20 MED ORDER — ACETAMINOPHEN 325 MG PO TABS
650.0000 mg | ORAL_TABLET | ORAL | Status: DC | PRN
  Administered 2020-07-21 – 2020-07-24 (×11): 650 mg via ORAL
  Filled 2020-07-20 (×11): qty 2

## 2020-07-20 MED ORDER — NITROGLYCERIN 0.4 MG SL SUBL: 0.4000 mg | SUBLINGUAL_TABLET | SUBLINGUAL | Status: DC | PRN

## 2020-07-20 MED ORDER — ACETAMINOPHEN 325 MG PO TABS
650.0000 mg | ORAL_TABLET | Freq: Once | ORAL | Status: AC
  Administered 2020-07-20: 650 mg via ORAL
  Filled 2020-07-20: qty 2

## 2020-07-20 MED ORDER — INSULIN ASPART 100 UNIT/ML ~~LOC~~ SOLN
0.0000 [IU] | Freq: Three times a day (TID) | SUBCUTANEOUS | Status: DC
  Administered 2020-07-21 (×2): 3 [IU] via SUBCUTANEOUS
  Administered 2020-07-22: 8 [IU] via SUBCUTANEOUS
  Administered 2020-07-22: 3 [IU] via SUBCUTANEOUS
  Administered 2020-07-22: 8 [IU] via SUBCUTANEOUS
  Administered 2020-07-23: 5 [IU] via SUBCUTANEOUS
  Administered 2020-07-23: 8 [IU] via SUBCUTANEOUS
  Administered 2020-07-23: 11 [IU] via SUBCUTANEOUS
  Administered 2020-07-24 (×2): 3 [IU] via SUBCUTANEOUS
  Administered 2020-07-24: 2 [IU] via SUBCUTANEOUS

## 2020-07-20 MED ORDER — FUROSEMIDE 10 MG/ML IJ SOLN
20.0000 mg | Freq: Once | INTRAMUSCULAR | Status: AC
  Administered 2020-07-20: 20 mg via INTRAVENOUS
  Filled 2020-07-20: qty 2

## 2020-07-20 MED ORDER — ASPIRIN 81 MG PO CHEW
81.0000 mg | CHEWABLE_TABLET | ORAL | Status: AC
  Administered 2020-07-21: 81 mg via ORAL
  Filled 2020-07-20: qty 1

## 2020-07-20 MED ORDER — SODIUM CHLORIDE 0.9% FLUSH: 3.0000 mL | INTRAVENOUS | Status: DC | PRN

## 2020-07-20 MED ORDER — ROSUVASTATIN CALCIUM 20 MG PO TABS
40.0000 mg | ORAL_TABLET | Freq: Every day | ORAL | Status: DC
  Administered 2020-07-20 – 2020-07-24 (×5): 40 mg via ORAL
  Filled 2020-07-20 (×5): qty 2

## 2020-07-20 MED ORDER — CARVEDILOL 6.25 MG PO TABS
6.2500 mg | ORAL_TABLET | Freq: Two times a day (BID) | ORAL | Status: DC
  Administered 2020-07-20 – 2020-07-24 (×9): 6.25 mg via ORAL
  Filled 2020-07-20 (×9): qty 1

## 2020-07-20 MED ORDER — PANTOPRAZOLE SODIUM 40 MG PO TBEC
40.0000 mg | DELAYED_RELEASE_TABLET | Freq: Every day | ORAL | Status: DC
  Administered 2020-07-21 – 2020-07-24 (×4): 40 mg via ORAL
  Filled 2020-07-20 (×4): qty 1

## 2020-07-20 MED ORDER — ONDANSETRON HCL 4 MG/2ML IJ SOLN: 4.0000 mg | Freq: Four times a day (QID) | INTRAMUSCULAR | Status: DC | PRN

## 2020-07-20 MED ORDER — INSULIN GLARGINE 100 UNIT/ML ~~LOC~~ SOLN
40.0000 [IU] | Freq: Every day | SUBCUTANEOUS | Status: DC
  Administered 2020-07-20 – 2020-07-24 (×5): 40 [IU] via SUBCUTANEOUS
  Filled 2020-07-20 (×6): qty 0.4

## 2020-07-20 MED ORDER — SODIUM CHLORIDE 0.9 % IV SOLN: INTRAVENOUS | Status: DC

## 2020-07-20 MED ORDER — CLOPIDOGREL BISULFATE 75 MG PO TABS
75.0000 mg | ORAL_TABLET | Freq: Every day | ORAL | Status: DC
  Administered 2020-07-20 – 2020-07-21 (×2): 75 mg via ORAL
  Filled 2020-07-20 (×2): qty 1

## 2020-07-20 MED ORDER — SODIUM CHLORIDE 0.9% FLUSH
3.0000 mL | Freq: Two times a day (BID) | INTRAVENOUS | Status: DC
  Administered 2020-07-20 – 2020-07-24 (×8): 3 mL via INTRAVENOUS

## 2020-07-20 MED ORDER — INSULIN ASPART 100 UNIT/ML ~~LOC~~ SOLN
0.0000 [IU] | Freq: Every day | SUBCUTANEOUS | Status: DC
Start: 2020-07-20 — End: 2020-07-25
  Administered 2020-07-20: 3 [IU] via SUBCUTANEOUS
  Administered 2020-07-21: 4 [IU] via SUBCUTANEOUS
  Administered 2020-07-22: 5 [IU] via SUBCUTANEOUS
  Administered 2020-07-23 – 2020-07-24 (×2): 3 [IU] via SUBCUTANEOUS

## 2020-07-20 MED ORDER — ENOXAPARIN SODIUM 40 MG/0.4ML ~~LOC~~ SOLN
40.0000 mg | SUBCUTANEOUS | Status: DC
  Administered 2020-07-20: 40 mg via SUBCUTANEOUS
  Filled 2020-07-20: qty 0.4

## 2020-07-20 MED ORDER — GABAPENTIN 600 MG PO TABS
600.0000 mg | ORAL_TABLET | Freq: Three times a day (TID) | ORAL | Status: DC
  Administered 2020-07-20 – 2020-07-24 (×13): 600 mg via ORAL
  Filled 2020-07-20 (×13): qty 1

## 2020-07-20 NOTE — ED Triage Notes (Signed)
Pt presents to the ED from outpatient stress test that showed ischemia and an EF of 20%. Pt has complaints of SOB for 6 days but denies chest pain. Resp even and unlabored.

## 2020-07-20 NOTE — ED Notes (Signed)
Pt placed on 2 L of supplemental oxygen after destating to 88-89% on room air.

## 2020-07-20 NOTE — H&P (Addendum)
Cardiology Admission:   Patient ID: Danny Carpenter MRN: [id number]; DOB: [birth year]  Admit date: 07/18/2020 Date of Consult: 07/03/2020  Primary Care Provider: Serita Grammes, MD Primary Cardiologist: No primary care provider on file.  Primary Electrophysiologist:  None    Patient Profile:   Danny Carpenter is a 70 y.o. male with a hx of HTN, HLD, uncontrolled T2DM, CVA (remote lacunar infarct left cerebellum 10//18/20 MRI), AAA, Left foot ampuation, PAD s/p fem pop bypass, and heart failure  who is being seen today for the evaluation of chest pain and shortness of breath at the request of Dr.Ray.  History of Present Illness:   [name] had initial onset of chest pain on Thursday, April 14th. The pain came on a rest while eating dinner with his family. He experienced radiation of pain in his neck and was short of breath. He went to bed , but had to sleep elevated due to shortness of breath.The patient's chest pain at onset was a heaviness/pressure and he was having shortness of breath. The pain also radiated to his neck. It was not worse with inspiration. The patient's chest pain is located in the middle of his chest, is not sharp and does radiate to his back. The pain went away on Friday morning, but has intermentiately returned.  The patient does not complain of nausea and denies diaphoresis. He has been experiencing orthopnea , PND, and DOE. No recent sickness, dysuria, sputum production, n/v/d, or dizziness.   Myocardial imaging with SPECT yesterday revealed reversibility involving the apex and apical segment of the inferolateral wall.  Fixed defect involving inferior and anteroseptal walls.  There is global hypokinesis.  And left ventricular ejection fraction of 24%. This is newly reduce compared to Echo in 2020, EF 55-60%.  Past Medical History:  Diagnosis Date  . AAA (abdominal aortic aneurysm) without rupture ([place]) 06/11/2019   infrarenal   Last Assessment & Plan:  Uptodate.  Stable  at 4.1cm Repeat visit to vascular surg in 12/2015.  Marland Kitchen Anxiety   . Arthritis   . Chest wall pain 02/06/2015   Last Assessment & Plan:  Chest wall pain in setting of fall/injury to this side.  Suspect rib fx vs costochondritis but no resp distress, healing well and pain almost resolved. Patient declines xray today which i think is reasonable given resolutino of sx.  Return precautions reviewed  . CHF (congestive heart failure) ([place])   . Chronic fatigue 01/09/2015   Last Assessment & Plan:  Chronic fatigue. Suspect multifactorial including OSA, hyperglycemia (patient not taking insulin as prescribed), pain medications. No other constitutional sx.  Will plan for repeat lab work.   - follow up in 1 mth after sleep study results.  . Chronic pain 04/05/2013   Last Assessment & Plan:   Known Degenerative disc disease and bilateral peripheral neuropathic leg pain   continue cymbalta '60mg'$   reports has trialed injections in past as well as  '5mg'$  hydrocodone with no success. Also tried tramadol in the past with no benefit.  Prescribed Norco 7.5/'325mg'$  Hydrocodone/ Acetaminophen 1-1.5 tabs every 8 hours prn #150. 1 months through 04/17/14  UTox 06/14/14 app  . COPD (chronic obstructive pulmonary disease) ([place])   . CVA (cerebral vascular accident) ([place])   . DDD (degenerative disc disease) 10/08/2004  . Depression   . Diabetic foot infection ([place]) 01/17/2019  . Fracture of rib    Left  . GERD (gastroesophageal reflux disease)   . HTN (hypertension)   . Hypercholesteremia   .  Hyperlipidemia 06/11/2019   On Lipitor '80mg'$  per day  Last Assessment & Plan:  PLAN: HLD continue Lipitor '80mg'$  daily  . Myocardial infarction ([place])    "light" heart attack 15 years ago (per pt 01/26/19)  . Osteomyelitis ([place]) 08/05/2019   left foot  . PAD (peripheral artery disease) ([place]) 01/27/2019  . Pain medication agreement completed 02/06/2014   Completed updated pain contract on 02/05/14. Scanned into system and patient provided a  copy.  . Peripheral vascular disease ([place])   . PICC (peripherally inserted central catheter) in place 02/16/2019  . PONV (postoperative nausea and vomiting)   . Proteinuria 10/21/2002  . PVD (peripheral vascular disease) ([place])   . Seizure [facility])    one time in Oct. 2020  . Senile nuclear sclerosis 12/06/2011  . Sleep apnea    no cpap  . Subacute osteomyelitis of left foot ([place])   . Tear film insufficiency 12/06/2011  . Type 2 diabetes mellitus with other specified complication ([place])   . Weight loss 03/19/2015   Last Assessment & Plan:  Unintentional weight loss of 20 lbs over 6 weeks in setting of cold-like symptoms that have now resolved and weight has increased this visit.  Suspect HHS was playing role and has now resolved.  Patient well appearing with no concerns except left leg pain today.  Fu if return of symptoms.    Past Surgical History:  Procedure Laterality Date  . ABDOMINAL AORTOGRAM W/LOWER EXTREMITY N/A 01/19/2019   Procedure: ABDOMINAL AORTOGRAM W/LOWER EXTREMITY;  Surgeon: Serafina Mitchell, MD;  Location: [place] [facility];  Service: Cardiovascular;  Laterality: N/A;  . ABDOMINAL AORTOGRAM W/LOWER EXTREMITY Left 06/17/2019   Procedure: ABDOMINAL AORTOGRAM W/LOWER EXTREMITY;  Surgeon: Marty Heck, MD;  Location: [place] [facility];  Service: Cardiovascular;  Laterality: Left;  . AMPUTATION Left 02/22/2019   Procedure: LEFT FOOT 5TH RAY AMPUTATION;  Surgeon: Newt Minion, MD;  Location: [place];  Service: Orthopedics;  Laterality: Left;  . APPENDECTOMY    . ENDARTERECTOMY FEMORAL Left 01/27/2019   Procedure: ENDARTERECTOMY LEFT FEMORAL ARTERY AND PROFUNDA  ARTERY;  Surgeon: Serafina Mitchell, MD;  Location: [place];  Service: Vascular;  Laterality: Left;  . FEMORAL-POPLITEAL BYPASS GRAFT Left 01/27/2019   Procedure: BYPASS GRAFT FEMORAL-POPLITEAL ARTERY;  Surgeon: Serafina Mitchell, MD;  Location: [place];  Service: Vascular;  Laterality: Left;  . FINGER AMPUTATION     Left  second and third fingers, work accident  . KNEE SURGERY Left   . LOWER EXTREMITY ANGIOGRAM  05/2008   Left SFA atherectomy with diamondback 2.25 utilizing distal protection device [id number] placed in the left popliteal artery. Angioplasty of the left SFA with ATV balloon 6 x 40   . PATCH ANGIOPLASTY Left 01/27/2019   Procedure: Patch Angioplasty using Vein;  Surgeon: Serafina Mitchell, MD;  Location: [place];  Service: Vascular;  Laterality: Left;  . PERIPHERAL VASCULAR BALLOON ANGIOPLASTY Left 06/17/2019   Procedure: PERIPHERAL VASCULAR BALLOON ANGIOPLASTY;  Surgeon: Marty Heck, MD;  Location: [place] [facility];  Service: Cardiovascular;  Laterality: Left;  left proximal and mid bypass  . SHOULDER SURGERY Right   . TRANSMETATARSAL AMPUTATION Left 08/06/2019   Procedure: TRANSMETATARSAL AMPUTATION;  Surgeon: Landis Martins, DPM;  Location: [place];  Service: Podiatry;  Laterality: Left;  Available after 5pm   . VEIN HARVEST Left 01/27/2019   Procedure: Left Saphenous Vein Harvest;  Surgeon: Serafina Mitchell, MD;  Location: [place];  Service: Vascular;  Laterality:  Left;     No current facility-administered medications on file prior to encounter.   Current Outpatient Medications on File Prior to Encounter  Medication Sig Dispense Refill  . amLODipine (NORVASC) 10 MG tablet Take 1 tablet (10 mg total) by mouth daily. 30 tablet 0  . aspirin EC 81 MG tablet Take 81 mg by mouth daily. Swallow whole.    . clopidogrel (PLAVIX) 75 MG tablet Take 75 mg by mouth daily.    . cyclobenzaprine (FLEXERIL) 5 MG tablet Take 5 mg by mouth 3 (three) times daily as needed for muscle spasms.    . DULoxetine (CYMBALTA) 30 MG capsule Take 30 mg by mouth daily.    Marland Kitchen gabapentin (NEURONTIN) 600 MG tablet Take 600 mg by mouth 3 (three) times daily.    . hydroxypropyl methylcellulose / hypromellose (ISOPTO TEARS / GONIOVISC) 2.5 % ophthalmic solution Place 1 drop into both eyes daily as needed for dry eyes.    . insulin  aspart (NOVOLOG FLEXPEN) 100 UNIT/ML FlexPen Inject 6-8 Units into the skin See admin instructions. Inject 6-8 units into the skin two times a day (morning and early afternoon) as needed, per sliding scale: BGL 300-350 = 6 units; 351-400 = 8 units; >400 = CALL STAYWELL    . Insulin Degludec (TRESIBA) 100 UNIT/ML SOLN Inject 62 Units into the skin in the morning. Every morning to control blood sugar    . levocetirizine (XYZAL) 5 MG tablet Take 5 mg by mouth at bedtime.    Marland Kitchen lisinopril (ZESTRIL) 40 MG tablet Take 40 mg by mouth daily.    . metFORMIN (GLUCOPHAGE) 1000 MG tablet Take 1,000 mg by mouth 2 (two) times daily with a meal.    . metoprolol tartrate (LOPRESSOR) 25 MG tablet Take 25 mg by mouth 2 (two) times daily.    . nitroGLYCERIN (NITROSTAT) 0.4 MG SL tablet Place 0.4 mg under the tongue every 5 (five) minutes as needed for chest pain.    . pantoprazole (PROTONIX) 40 MG tablet Take 40 mg by mouth daily before breakfast.    . rosuvastatin (CRESTOR) 20 MG tablet Take 20 mg by mouth daily.    . sodium chloride 1 g tablet Take 2 g by mouth See admin instructions. Take 2 grams by mouth in the morning and 2 grams at bedtime    . Vitamin D, Ergocalciferol, (DRISDOL) 1.25 MG (50000 UNIT) CAPS capsule Take 50,000 Units by mouth 2 (two) times a week.       Allergies:   No Known Allergies  Social History:   Social History   Socioeconomic History  . Marital status: Married    Spouse name: Not on file  . Number of children: Not on file  . Years of education: Not on file  . Highest education level: Not on file  Occupational History  . Occupation: Disabled Administrator  Tobacco Use  . Smoking status: Former Smoker    Packs/day: 1.00    Years: 40.00    Pack years: 40.00    Types: Cigarettes    Quit date: 04/01/2009    Years since quitting: 11.3  . Smokeless tobacco: Never Used  Vaping Use  . Vaping Use: Never used  Substance and Sexual Activity  . Alcohol use: Not Currently  . Drug use:  Never  . Sexual activity: Not on file  Other Topics Concern  . Not on file  Social History Narrative   Lives with wife in [place] Determinants of Health  Financial Resource Strain: Not on file  Food Insecurity: Not on file  Transportation Needs: Not on file  Physical Activity: Not on file  Stress: Not on file  Social Connections: Not on file  Intimate Partner Violence: Not on file    Family History:   Denies any family history of heart disease.Mother and Father had cancer, unknown type. Both are deceased.   ROS:  Please see the history of present illness.  All other ROS reviewed and negative.     Physical Exam/Data:   Vitals:   07/04/2020 1406 06/30/2020 1435 07/11/2020 1445 07/04/2020 1450  BP:   128/65 128/65  Pulse: 96 96 99 99  Resp: (!) '21 16 16 17  '$ Temp:      TempSrc:      SpO2: (!) 88% 96% 97% 98%  Weight:      Height:       No intake or output data in the 24 hours ending 07/18/2020 1507 Last 3 Weights 07/19/2020 07/11/2020 08/04/2019  Weight (lbs) 242 lb 242 lb 215 lb  Weight (kg) 109.77 kg 109.77 kg 97.523 kg     Body mass index is 31.93 kg/m.   General: Obese man, appears to be uncomfortable lying in bed  HE: Normocephalic, atraumatic , EOMI, Conjunctivae normal ENT: No congestion, no rhinorrhea, no exudate or erythema  Cardiovascular: Normal rate, regular rhythm.  No murmurs, rubs, or gallops. No JVD, No Lower ext edema Pulmonary : Increased work of breathing, bilateral rales at bases  Abdominal: soft, nontender,  bowel sounds present Musculoskeletal: left foot amputated, tortures veins in bilateral legs Skin: Warm, dry  Psychiatric/Behavioral:  normal mood, normal behavior   EKG:  The EKG was personally reviewed and demonstrates: NSR , 93/min PR 224m QRS 120 QTc 472  normal axis.No ischemic changes  - Personally Reviewed  Relevant CV Studies:   Laboratory Data:  Chemistry Recent Labs  Lab 07/03/2020 1121  NA 132*  K 4.1  CL 100  CO2 23   GLUCOSE 249*  BUN 18  CREATININE 1.11  CALCIUM 8.8*  GFRNONAA >60  ANIONGAP 9    No results for input(s): PROT, ALBUMIN, AST, ALT, ALKPHOS, BILITOT in the last 168 hours. Hematology Recent Labs  Lab 07/14/2020 1121  WBC 7.0  RBC 4.74  HGB 12.9*  HCT 40.9  MCV 86.3  MCH 27.2  MCHC 31.5  RDW 14.6  PLT 236   Cardiac EnzymesNo results for input(s): TROPONINI in the last 168 hours. No results for input(s): TROPIPOC in the last 168 hours.  BNPNo results for input(s): BNP, PROBNP in the last 168 hours.  DDimer No results for input(s): DDIMER in the last 168 hours.  Radiology/Studies:  DG Chest 2 View  Result Date: 07/18/2020 CLINICAL DATA:  Shortness of breath. EXAM: CHEST - 2 VIEW COMPARISON:  01/17/2019. FINDINGS: Cardiomegaly. Mild bilateral interstitial prominence suggesting interstitial edema. Small right pleural effusion. No pneumothorax. Metallic densities again noted the left chest. Postsurgical changes right shoulder. Old left rib fractures again noted. IMPRESSION: Cardiomegaly. Mild bilateral interstitial prominence suggesting interstitial edema. Small right pleural effusion. Electronically Signed   By: TMarcello Moores Register   On: 07/27/2020 11:53      Assessment and Plan:   #Chest Pain and Shortness of Breath # Elevated Troponin #Acute on Chronic HF  Initial chest pain started last Thursday on the 14th.  Patient was sent for a stress test by PCP , showed new reduced ejection fraction at [facility] (Myocardial imaging with SPECT yesterday revealed  reversibility involving the apex and apical segment of the inferolateral wall.  Fixed defect involving inferior and anteroseptal walls.  There is global hypokinesis.  And left ventricular ejection fraction of 24%). Echo 12/2018 showed EF 55% to 60%. Patient's shortness of breath. HS Troponin peaked 172> 149. BNP pending .  - Recent finding and history are concerning for possible ischemic event leading to acute heart failure  exacerbation. Plan to give one dose of lasix tonight to avoid verdure before LHC. Order placed for TEE and LHC. Will start GDMT with Coreg, can add additional medications after surgery.  - TTE - Coreg 6.25 BID - IV lasix 20 mg once - Lipid Panel  - Strict ins and outs - Daily Weights  -NPO at midnight, LHC tomorrow   #HTN Normotensive on exam. Home medication amlodipine 10 mg, Lisinopril 40 mg daily, metoprolol 25 mg twice daily - Hold metoprolol, lisinopril and amlodopine - Start Coreg   #Hyperlipidemia -Increase Crestor from 20 mg to  40 mg daily - Lipid Panel  #T2DM -Last hemoglobin A1c 08/05/2019, 11.6.  On review appears uncontrolled in the past as well. Home medications Tresiba 62 units daily, and a 6 to 8 units of aspart twice daily sliding scale - Lantus 40 units, SSI, QHS correction  #PAD -Continue aspirin and Plavix  For questions or updates, please contact [facility] Please consult [email or web address] for contact info under     Signed, Lorene Dy, MD  PGY2, IM 07/28/2020 3:07 PM  See attending attestation for final plan.   History and all data above reviewed.  Patient examined.  The patient is new to [place].  He has a history of PVD and multiple CAD risk factors.  He has had a perfusion study years ago and an echo with EF well preserved in the recent past.  Now with new onset chest pain on Thursday of last week.  Substernal and followed by new onset PND, orthopnea, DOE.  Pain resolved but subsequently to have abnormal perfusion imaging as above.  I is pain free now.  No acute findings on his EKG.  Trop is minimally elevated.  He gets around at home and does his chores.  [facility] [employer] and does some activity such as rowing machine without recent chest pain.  I agree with the findings as above.  The patient exam reveals COR:RRR, quiet precordium.  No obvious murmurs.   ,  Lungs: Few basilar cracles  ,  Abd: distended.  Positive bowel sounds, no rebound no guarding , Ext : Trace  edema, absent DP/PT bilateral with amputation of all of the toes on the left foot  All available labs, radiology testing, previous records reviewed. Agree with documented assessment and plan.   Elevated troponin:  He might have had an out of hospital MI last week.  His EF was low on nuclear and I will not be surprised to see this demonstrated on the echo that is pending.  He has new onset HF likely with reduced EF likely ischemic.  I think he needs a dose of diuretic tonight to be sure that he can be flat for cath.  I will hold off on Entresto following his renal function first.  I do think he is compensated well enough to start beta blocker.  Continue ASA/Plavix.  Needs cath left heart in AM.  The patient understands that risks included but are not limited to stroke (1 in 1000), death (1 in 1000), kidney failure [usually temporary] (1 in 500), bleeding (1  in 200), allergic reaction [possibly serious] (1 in 200).  The patient understands and agrees to proceed.    Before discharge will likely start Entresto and spironolactone.  Hold off on SGLT2i which can be started as an out patient.  Needs aggressive risk reduction.  Check A1C and lipid.  Increase Crestor to 40 mg.    Minus Breeding  5:39 PM  07/19/2020

## 2020-07-20 NOTE — Progress Notes (Signed)
Dr. Percival Spanish requested to place patient on cath schedule for LCP - scheduled with Dr. Saunders Revel. Orders written per our discussion. He has seen the patient with the resident, their note to follow. Lively Haberman PA-C

## 2020-07-20 NOTE — ED Notes (Signed)
Ordered dinner tray for pt.  

## 2020-07-20 NOTE — ED Provider Notes (Signed)
[facility] Provider Note   CSN: [id number] Arrival date & time: 07/13/2020  1100     History Chief Complaint  Patient presents with  . Shortness of Breath  . Chest Pain    Shari Zeedyk is a 70 y.o. male who was sent by cardiologist for abnormal stress test performed yesterday.  Patient endorses dyspnea on exertion that began 1 week ago today but has progressively worsened and he is short of breath even with speaking at this time.  He denies any history of chest pain at any time.  He does endorse intermittent palpitations.  Denies any history of coronary artery disease that he is aware of.  He denies any nausea, vomiting, diarrhea, fevers, chills at home. I personally read the patient medical records.  He has history of CVA on aspirin and Plavix history of type 2 diabetes, AAA without rupture hypertension, hyperlipidemia.   Echo in 10/20 with LVEF of 55 to 60%.  [facility] revealed 1 mm horizontal ST depression in lead II, V4, V5 suggesting ischemia.  There were also frequent PVCs noted throughout the stress test as well as in the recovery period.  Additionally myocardial imaging with SPECT yesterday revealed reversibility involving the apex and apical segment of the inferolateral wall.  Fixed defect involving inferior and anteroseptal walls.  There is global hypokinesis.  And left ventricular ejection fraction of 24%.  HPI      Past Medical History:  Diagnosis Date  . AAA (abdominal aortic aneurysm) without rupture ([place]) 06/11/2019   infrarenal   Last Assessment & Plan:  Uptodate.  Stable at 4.1cm Repeat visit to vascular surg in 12/2015.  Marland Kitchen Anxiety   . Arthritis   . Chronic fatigue 01/09/2015   Last Assessment & Plan:  Chronic fatigue. Suspect multifactorial including OSA, hyperglycemia (patient not taking insulin as prescribed), pain medications. No other constitutional sx.  Will plan  for repeat lab work.   - follow up in 1 mth after sleep study results.  . Chronic pain 04/05/2013   Last Assessment & Plan:   Known Degenerative disc disease and bilateral peripheral neuropathic leg pain   continue cymbalta '60mg'$   reports has trialed injections in past as well as  '5mg'$  hydrocodone with no success. Also tried tramadol in the past with no benefit.  Prescribed Norco 7.5/'325mg'$  Hydrocodone/ Acetaminophen 1-1.5 tabs every 8 hours prn #150. 1 months through 04/17/14  UTox 06/14/14 app  . COPD (chronic obstructive pulmonary disease) ([place])   . CVA (cerebral vascular accident) ([place])   . DDD (degenerative disc disease) 10/08/2004  . Depression   . Diabetic foot infection ([place]) 01/17/2019  . Fracture of rib    Left  . GERD (gastroesophageal reflux disease)   . HTN (hypertension)   . Hypercholesteremia   . Hyperlipidemia 06/11/2019   On Lipitor '80mg'$  per day  Last Assessment & Plan:  PLAN: HLD continue Lipitor '80mg'$  daily  . Osteomyelitis ([place]) 08/05/2019   left foot  . PAD (peripheral artery disease) ([place]) 01/27/2019  . Peripheral vascular disease ([place])   . PONV (postoperative nausea and vomiting)   . PVD (peripheral vascular disease) ([place])   . Seizure [facility])    one time in Oct. 2020  . Senile nuclear sclerosis 12/06/2011  . Sleep apnea    no cpap  . Subacute osteomyelitis of left foot ([place])   . Type 2 diabetes mellitus with other specified complication ([place])  Patient Active Problem List   Diagnosis Date Noted  . Acute on chronic heart failure ([place]) 07/24/2020  . Osteomyelitis ([place]) 08/05/2019  . AAA (abdominal aortic aneurysm) without rupture ([place]) 06/11/2019  . Hyperlipidemia 06/11/2019  . PICC (peripherally inserted central catheter) in place 02/16/2019  . Encounter for medication management 02/16/2019  . PAD (peripheral artery disease) ([place]) 01/27/2019  . Type 2 diabetes mellitus with other specified complication ([place])   . Subacute osteomyelitis of left foot ([place])   . PVD  (peripheral vascular disease) ([place])   . CVA (cerebral vascular accident) ([place]) 01/17/2019  . Diabetic foot infection ([place]) 01/17/2019  . Weight loss 03/19/2015  . Chest wall pain 02/06/2015  . Chronic fatigue 01/09/2015  . Eye pain 09/23/2014  . Pain medication agreement completed 02/06/2014  . Chronic pain 04/05/2013  . Senile nuclear sclerosis 12/06/2011  . Tear film insufficiency 12/06/2011  . Depression 01/07/2005  . DDD (degenerative disc disease) 10/08/2004  . Proteinuria 10/21/2002    Past Surgical History:  Procedure Laterality Date  . ABDOMINAL AORTOGRAM W/LOWER EXTREMITY N/A 01/19/2019   Procedure: ABDOMINAL AORTOGRAM W/LOWER EXTREMITY;  Surgeon: Serafina Mitchell, MD;  Location: [facility];  Service: Cardiovascular;  Laterality: N/A;  . ABDOMINAL AORTOGRAM W/LOWER EXTREMITY Left 06/17/2019   Procedure: ABDOMINAL AORTOGRAM W/LOWER EXTREMITY;  Surgeon: Marty Heck, MD;  Location: [facility];  Service: Cardiovascular;  Laterality: Left;  . AMPUTATION Left 02/22/2019   Procedure: LEFT FOOT 5TH RAY AMPUTATION;  Surgeon: Newt Minion, MD;  Location: [facility];  Service: Orthopedics;  Laterality: Left;  . APPENDECTOMY    . ENDARTERECTOMY FEMORAL Left 01/27/2019   Procedure: ENDARTERECTOMY LEFT FEMORAL ARTERY AND PROFUNDA  ARTERY;  Surgeon: Serafina Mitchell, MD;  Location: [facility];  Service: Vascular;  Laterality: Left;  . FEMORAL-POPLITEAL BYPASS GRAFT Left 01/27/2019   Procedure: BYPASS GRAFT FEMORAL-POPLITEAL ARTERY;  Surgeon: Serafina Mitchell, MD;  Location: [facility];  Service: Vascular;  Laterality: Left;  . FINGER AMPUTATION     Left second and third fingers, work accident  . KNEE SURGERY Left   . LOWER EXTREMITY ANGIOGRAM  05/2008   Left SFA atherectomy with diamondback 2.25 utilizing distal protection device [id number] placed in the left popliteal artery. Angioplasty of the left SFA with ATV balloon 6 x 40   . PATCH ANGIOPLASTY Left 01/27/2019   Procedure:  Patch Angioplasty using Vein;  Surgeon: Serafina Mitchell, MD;  Location: [facility];  Service: Vascular;  Laterality: Left;  . PERIPHERAL VASCULAR BALLOON ANGIOPLASTY Left 06/17/2019   Procedure: PERIPHERAL VASCULAR BALLOON ANGIOPLASTY;  Surgeon: Marty Heck, MD;  Location: [facility];  Service: Cardiovascular;  Laterality: Left;  left proximal and mid bypass  . SHOULDER SURGERY Right   . TRANSMETATARSAL AMPUTATION Left 08/06/2019   Procedure: TRANSMETATARSAL AMPUTATION;  Surgeon: Landis Martins, DPM;  Location: [facility];  Service: Podiatry;  Laterality: Left;  Available after 5pm   . VEIN HARVEST Left 01/27/2019   Procedure: Left Saphenous Vein Harvest;  Surgeon: Serafina Mitchell, MD;  Location: [facility];  Service: Vascular;  Laterality: Left;       Family History  Problem Relation Age of Onset  . Cancer Mother   . Cancer Father     Social History   Tobacco Use  . Smoking status: Former Smoker    Packs/day: 1.00    Years: 40.00    Pack years: 40.00    Types: Cigarettes    Quit date: 04/01/2009  Years since quitting: 11.3  . Smokeless tobacco: Never Used  Vaping Use  . Vaping Use: Never used  Substance Use Topics  . Alcohol use: Not Currently  . Drug use: Never    Home Medications Prior to Admission medications   Medication Sig Start Date End Date Taking? Authorizing Provider  amLODipine (NORVASC) 10 MG tablet Take 1 tablet (10 mg total) by mouth daily. 01/21/19   Masoudi, Dorthula Rue, MD  aspirin EC 81 MG tablet Take 81 mg by mouth daily. Swallow whole.    [provider]  clopidogrel (PLAVIX) 75 MG tablet Take 75 mg by mouth daily.    [provider]  cyclobenzaprine (FLEXERIL) 5 MG tablet Take 5 mg by mouth 3 (three) times daily as needed for muscle spasms.    [provider]  DULoxetine (CYMBALTA) 30 MG capsule Take 30 mg by mouth daily.    [provider]  gabapentin (NEURONTIN) 600 MG tablet Take 600 mg by mouth 3 (three)  times daily.    [provider]  hydroxypropyl methylcellulose / hypromellose (ISOPTO TEARS / GONIOVISC) 2.5 % ophthalmic solution Place 1 drop into both eyes daily as needed for dry eyes.    [provider]  insulin aspart (NOVOLOG FLEXPEN) 100 UNIT/ML FlexPen Inject 6-8 Units into the skin See admin instructions. Inject 6-8 units into the skin two times a day (morning and early afternoon) as needed, per sliding scale: BGL 300-350 = 6 units; 351-400 = 8 units; >400 = CALL [facility]    [provider]  Insulin Degludec (TRESIBA) 100 UNIT/ML SOLN Inject 62 Units into the skin in the morning. Every morning to control blood sugar    [provider]  levocetirizine (XYZAL) 5 MG tablet Take 5 mg by mouth at bedtime.    [provider]  lisinopril (ZESTRIL) 40 MG tablet Take 40 mg by mouth daily.    [provider]  metFORMIN (GLUCOPHAGE) 1000 MG tablet Take 1,000 mg by mouth 2 (two) times daily with a meal.    [provider]  metoprolol tartrate (LOPRESSOR) 25 MG tablet Take 25 mg by mouth 2 (two) times daily.    [provider]  nitroGLYCERIN (NITROSTAT) 0.4 MG SL tablet Place 0.4 mg under the tongue every 5 (five) minutes as needed for chest pain.    [provider]  pantoprazole (PROTONIX) 40 MG tablet Take 40 mg by mouth daily before breakfast.    [provider]  rosuvastatin (CRESTOR) 20 MG tablet Take 20 mg by mouth daily.    [provider]  sodium chloride 1 g tablet Take 2 g by mouth See admin instructions. Take 2 grams by mouth in the morning and 2 grams at bedtime    [provider]  Vitamin D, Ergocalciferol, (DRISDOL) 1.25 MG (50000 UNIT) CAPS capsule Take 50,000 Units by mouth 2 (two) times a week.    [provider]    Allergies    Patient has no known allergies.  Review of Systems   Review of Systems  Constitutional: Positive for activity change and fatigue. Negative  for appetite change, chills, diaphoresis and fever.  HENT: Negative.   Respiratory: Positive for chest tightness and shortness of breath. Negative for cough.   Cardiovascular: Positive for palpitations. Negative for chest pain and leg swelling.  Gastrointestinal: Negative.   Musculoskeletal: Negative.   Skin: Negative.   Neurological: Negative.   Hematological: Negative.     Physical Exam Updated Vital Signs BP 140/87  Pulse 91   Temp 99.1 F (37.3 C) (Oral)   Resp 16   Ht '6\' 1"'$  (1.854 m)   Wt 109.8 kg   SpO2 95%   BMI 31.93 kg/m   Physical Exam Vitals and nursing note reviewed.  Constitutional:      Appearance: He is obese. He is not toxic-appearing.  HENT:     Head: Normocephalic and atraumatic.     Nose: Nose normal.     Mouth/Throat:     Mouth: Mucous membranes are moist.     Pharynx: Oropharynx is clear. Uvula midline. No oropharyngeal exudate, posterior oropharyngeal erythema or uvula swelling.  Eyes:     General: Lids are normal. Vision grossly intact.        Right eye: No discharge.        Left eye: No discharge.     Extraocular Movements: Extraocular movements intact.     Conjunctiva/sclera: Conjunctivae normal.     Pupils: Pupils are equal, round, and reactive to light.  Neck:     Trachea: Trachea normal.  Cardiovascular:     Rate and Rhythm: Normal rate and regular rhythm.     Pulses: Normal pulses.     Heart sounds: Normal heart sounds. No murmur heard.   Pulmonary:     Effort: Pulmonary effort is normal. Tachypnea present. No bradypnea, accessory muscle usage, prolonged expiration or respiratory distress.     Breath sounds: Examination of the right-middle field reveals rales. Examination of the right-lower field reveals rales. Examination of the left-lower field reveals rales. Rales present. No wheezing.     Comments: SOB with speaking Chest:     Chest wall: No mass, lacerations, deformity, swelling, tenderness, crepitus or edema.  Abdominal:      General: Bowel sounds are normal.     Palpations: Abdomen is soft.     Tenderness: There is no abdominal tenderness. There is no right CVA tenderness, left CVA tenderness, guarding or rebound.  Musculoskeletal:        General: No deformity.     Cervical back: Neck supple. No rigidity or crepitus. No pain with movement.     Right lower leg: No edema.     Left lower leg: No edema.  Lymphadenopathy:     Cervical: No cervical adenopathy.  Skin:    General: Skin is warm and dry.  Neurological:     Mental Status: He is alert. Mental status is at baseline.     Sensory: Sensation is intact.     Motor: Motor function is intact.     Gait: Gait is intact.  Psychiatric:        Mood and Affect: Mood normal.     ED Results / Procedures / Treatments   Labs (all labs ordered are listed, but only abnormal results are displayed) Labs Reviewed  BASIC METABOLIC PANEL - Abnormal; Notable for the following components:      Result Value   Sodium 132 (*)    Glucose, Bld 249 (*)    Calcium 8.8 (*)    All other components within normal limits  CBC - Abnormal; Notable for the following components:   Hemoglobin 12.9 (*)    All other components within normal limits  BRAIN NATRIURETIC PEPTIDE - Abnormal; Notable for the following components:   B Natriuretic Peptide 465.5 (*)    All other components within normal limits  TROPONIN I (HIGH SENSITIVITY) - Abnormal; Notable for the following components:   Troponin I (High Sensitivity) 172 (*)  All other components within normal limits  TROPONIN I (HIGH SENSITIVITY) - Abnormal; Notable for the following components:   Troponin I (High Sensitivity) 149 (*)    All other components within normal limits  RESP PANEL BY RT-PCR (FLU A&B, COVID) ARPGX2  PROTIME-INR  HIV ANTIBODY (ROUTINE TESTING W REFLEX)  BASIC METABOLIC PANEL  LIPID PANEL  CBC  CBC  CREATININE, SERUM  HEMOGLOBIN A1C    EKG EKG Interpretation  Date/Time:  Thursday July 20 2020  11:23:02 EDT Ventricular Rate:  93 PR Interval:  228 QRS Duration: 120 QT Interval:  380 QTC Calculation: 472 R Axis:   -12 Text Interpretation: Sinus rhythm with 1st degree A-V block Minimal voltage criteria for LVH, may be normal variant ( Cornell product ) Cannot rule out Anterior infarct , age undetermined Abnormal ECG Confirmed by Pattricia Boss [phone]) on 07/14/2020 11:25:47 AM   Radiology DG Chest 2 View  Result Date: 07/27/2020 CLINICAL DATA:  Shortness of breath. EXAM: CHEST - 2 VIEW COMPARISON:  01/17/2019. FINDINGS: Cardiomegaly. Mild bilateral interstitial prominence suggesting interstitial edema. Small right pleural effusion. No pneumothorax. Metallic densities again noted the left chest. Postsurgical changes right shoulder. Old left rib fractures again noted. IMPRESSION: Cardiomegaly. Mild bilateral interstitial prominence suggesting interstitial edema. Small right pleural effusion. Electronically Signed   By: Marcello Moores  Register   On: 07/08/2020 11:53    Procedures .Critical Care Performed by: Emeline Darling, PA-C Authorized by: Emeline Darling, PA-C   Critical care provider statement:    Critical care time (minutes):  45   Critical care was necessary to treat or prevent imminent or life-threatening deterioration of the following conditions: NSTEMI.   Critical care was time spent personally by me on the following activities:  Discussions with consultants, evaluation of patient's response to treatment, examination of patient, ordering and performing treatments and interventions, ordering and review of laboratory studies, ordering and review of radiographic studies, pulse oximetry, re-evaluation of patient's condition, obtaining history from patient or surrogate and review of old charts    Medications Ordered in ED Medications  sodium chloride flush (NS) 0.9 % injection 3 mL (has no administration in time range)  sodium chloride flush (NS) 0.9 % injection 3 mL (has  no administration in time range)  0.9 %  sodium chloride infusion (has no administration in time range)  aspirin chewable tablet 81 mg (has no administration in time range)  0.9 %  sodium chloride infusion (has no administration in time range)  aspirin EC tablet 81 mg (has no administration in time range)  rosuvastatin (CRESTOR) tablet 40 mg (40 mg Oral Given 07/28/2020 1850)  DULoxetine (CYMBALTA) DR capsule 30 mg (has no administration in time range)  pantoprazole (PROTONIX) EC tablet 40 mg (has no administration in time range)  clopidogrel (PLAVIX) tablet 75 mg (75 mg Oral Given 07/07/2020 1850)  gabapentin (NEURONTIN) tablet 600 mg (has no administration in time range)  nitroGLYCERIN (NITROSTAT) SL tablet 0.4 mg (has no administration in time range)  acetaminophen (TYLENOL) tablet 650 mg (has no administration in time range)  ondansetron (ZOFRAN) injection 4 mg (has no administration in time range)  carvedilol (COREG) tablet 6.25 mg (6.25 mg Oral Given 07/11/2020 1850)  enoxaparin (LOVENOX) injection 40 mg (40 mg Subcutaneous Given 07/27/2020 1851)  insulin glargine (LANTUS) injection 40 Units (has no administration in time range)  insulin aspart (novoLOG) injection 0-15 Units (has no administration in time range)  insulin aspart (novoLOG) injection 0-5 Units (has no administration in time range)  acetaminophen (TYLENOL) tablet 650 mg (650 mg Oral Given 07/03/2020 1544)  furosemide (LASIX) injection 20 mg (20 mg Intravenous Given 07/28/2020 1851)    ED Course  I have reviewed the triage vital signs and the nursing notes.  Pertinent labs & imaging results that were available during my care of the patient were reviewed by me and considered in my medical decision making (see chart for details).  Clinical Course as of 07/05/2020 2008  Thu Jul 20, 2020  1217 Consult call return from cardiology coordinator, Wannetta Sender.  She is agreeable to adding patient to the cardiology consult list.  I appreciate her  collaboration in the care of this patient. She is requesting we hold off on admission  [RS]    Clinical Course User Index [RS] Marvis Saefong, Sharlene Dory   MDM Rules/Calculators/A&P                         70 year old male who presents to the emergency department following abnormal stress test with worsening DOE x1 week . Differential diagnose includes but is limited to ACS, stable angina, cardiac failure, PE, pneumonia, pleural effusion.  Hypertensive and tachypneic on intake.  Vital signs otherwise normal.  Cardiac exam revealed borderline tachycardia.  Pulmonary exam with rales in the lung bases.  Shortness of breath with speaking.  Patient is neurologically intact.  EKG with sinus rhythm.  Chest x-ray with interstitial edema.  CBC unremarkable, BMP with hyperglycemia.  BNP mildly elevated to 465.  Troponin significantly elevated to 149.  Cardiology was consulted the patient; he will be admitted to their service.  Earl voiced understanding of his medical evaluation and treatment plan.  Each of his questions answered to his expressed satisfaction.  He is amenable to plan for admission at this time.   This chart was dictated using voice recognition software, Dragon. Despite the best efforts of this provider to proofread and correct errors, errors may still occur which can change documentation meaning.  Final Clinical Impression(s) / ED Diagnoses Final diagnoses:  None    Rx / DC Orders ED Discharge Orders    None       Aura Dials 07/03/2020 2008    Pattricia Boss, MD 07/05/2020 0800

## 2020-07-20 NOTE — Consult Note (Deleted)
Cardiology Admission:   Patient ID: Danny Carpenter MRN: [id number]; DOB: January 06, 1951  Admit date: 07/12/2020 Date of Consult: 07/12/2020  Primary Care Provider: Serita Grammes, MD Primary Cardiologist: No primary care provider on file.  Primary Electrophysiologist:  None    Patient Profile:   Danny Carpenter is a 70 y.o. male with a hx of HTN, HLD, uncontrolled T2DM, CVA (remote lacunar infarct left cerebellum 10//18/20 MRI), AAA, Left foot ampuation, PAD s/p fem pop bypass, and heart failure  who is being seen today for the evaluation of chest pain and shortness of breath at the request of Dr.Ray.  History of Present Illness:   Danny Carpenter had initial onset of chest pain on Thursday, April 14th. The pain came on a rest while eating dinner with his family. He experienced radiation of pain in his neck and was short of breath. He went to bed , but had to sleep elevated due to shortness of breath.The patient's chest pain at onset was a heaviness/pressure and he was having shortness of breath. The pain also radiated to his neck. It was not worse with inspiration. The patient's chest pain is located in the middle of his chest, is not sharp and does radiate to his back. The pain went away on Friday morning, but has intermentiately returned.  The patient does not complain of nausea and denies diaphoresis. He has been experiencing orthopnea , PND, and DOE. No recent sickness, dysuria, sputum production, n/v/d, or dizziness.   Myocardial imaging with SPECT yesterday revealed reversibility involving the apex and apical segment of the inferolateral wall.  Fixed defect involving inferior and anteroseptal walls.  There is global hypokinesis.  And left ventricular ejection fraction of 24%. This is newly reduce compared to Echo in 2020, EF 55-60%.  Past Medical History:  Diagnosis Date  . AAA (abdominal aortic aneurysm) without rupture ([place]) 06/11/2019   infrarenal   Last Assessment & Plan:  Uptodate.  Stable  at 4.1cm Repeat visit to vascular surg in 12/2015.  Marland Kitchen Anxiety   . Arthritis   . Chest wall pain 02/06/2015   Last Assessment & Plan:  Chest wall pain in setting of fall/injury to this side.  Suspect rib fx vs costochondritis but no resp distress, healing well and pain almost resolved. Patient declines xray today which i think is reasonable given resolutino of sx.  Return precautions reviewed  . CHF (congestive heart failure) ([place])   . Chronic fatigue 01/09/2015   Last Assessment & Plan:  Chronic fatigue. Suspect multifactorial including OSA, hyperglycemia (patient not taking insulin as prescribed), pain medications. No other constitutional sx.  Will plan for repeat lab work.   - follow up in 1 mth after sleep study results.  . Chronic pain 04/05/2013   Last Assessment & Plan:   Known Degenerative disc disease and bilateral peripheral neuropathic leg pain   continue cymbalta '60mg'$   reports has trialed injections in past as well as  '5mg'$  hydrocodone with no success. Also tried tramadol in the past with no benefit.  Prescribed Norco 7.5/'325mg'$  Hydrocodone/ Acetaminophen 1-1.5 tabs every 8 hours prn #150. 1 months through 04/17/14  UTox 06/14/14 app  . COPD (chronic obstructive pulmonary disease) ([place])   . CVA (cerebral vascular accident) ([place])   . DDD (degenerative disc disease) 10/08/2004  . Depression   . Diabetic foot infection ([place]) 01/17/2019  . Fracture of rib    Left  . GERD (gastroesophageal reflux disease)   . HTN (hypertension)   . Hypercholesteremia   .  Hyperlipidemia 06/11/2019   On Lipitor '80mg'$  per day  Last Assessment & Plan:  PLAN: HLD continue Lipitor '80mg'$  daily  . Myocardial infarction ([place])    "light" heart attack 15 years ago (per pt 01/26/19)  . Osteomyelitis ([place]) 08/05/2019   left foot  . PAD (peripheral artery disease) ([place]) 01/27/2019  . Pain medication agreement completed 02/06/2014   Completed updated pain contract on 02/05/14. Scanned into system and patient provided a  copy.  . Peripheral vascular disease ([place])   . PICC (peripherally inserted central catheter) in place 02/16/2019  . PONV (postoperative nausea and vomiting)   . Proteinuria 10/21/2002  . PVD (peripheral vascular disease) ([place])   . Seizure [facility])    one time in Oct. 2020  . Senile nuclear sclerosis 12/06/2011  . Sleep apnea    no cpap  . Subacute osteomyelitis of left foot ([place])   . Tear film insufficiency 12/06/2011  . Type 2 diabetes mellitus with other specified complication ([place])   . Weight loss 03/19/2015   Last Assessment & Plan:  Unintentional weight loss of 20 lbs over 6 weeks in setting of cold-like symptoms that have now resolved and weight has increased this visit.  Suspect HHS was playing role and has now resolved.  Patient well appearing with no concerns except left leg pain today.  Fu if return of symptoms.    Past Surgical History:  Procedure Laterality Date  . ABDOMINAL AORTOGRAM W/LOWER EXTREMITY N/A 01/19/2019   Procedure: ABDOMINAL AORTOGRAM W/LOWER EXTREMITY;  Surgeon: Serafina Mitchell, MD;  Location: [place] [facility];  Service: Cardiovascular;  Laterality: N/A;  . ABDOMINAL AORTOGRAM W/LOWER EXTREMITY Left 06/17/2019   Procedure: ABDOMINAL AORTOGRAM W/LOWER EXTREMITY;  Surgeon: Marty Heck, MD;  Location: [place] [facility];  Service: Cardiovascular;  Laterality: Left;  . AMPUTATION Left 02/22/2019   Procedure: LEFT FOOT 5TH RAY AMPUTATION;  Surgeon: Newt Minion, MD;  Location: [place];  Service: Orthopedics;  Laterality: Left;  . APPENDECTOMY    . ENDARTERECTOMY FEMORAL Left 01/27/2019   Procedure: ENDARTERECTOMY LEFT FEMORAL ARTERY AND PROFUNDA  ARTERY;  Surgeon: Serafina Mitchell, MD;  Location: [place];  Service: Vascular;  Laterality: Left;  . FEMORAL-POPLITEAL BYPASS GRAFT Left 01/27/2019   Procedure: BYPASS GRAFT FEMORAL-POPLITEAL ARTERY;  Surgeon: Serafina Mitchell, MD;  Location: [place];  Service: Vascular;  Laterality: Left;  . FINGER AMPUTATION     Left  second and third fingers, work accident  . KNEE SURGERY Left   . LOWER EXTREMITY ANGIOGRAM  05/2008   Left SFA atherectomy with diamondback 2.25 utilizing distal protection device NAV6 placed in the left popliteal artery. Angioplasty of the left SFA with ATV balloon 6 x 40   . PATCH ANGIOPLASTY Left 01/27/2019   Procedure: Patch Angioplasty using Vein;  Surgeon: Serafina Mitchell, MD;  Location: [place];  Service: Vascular;  Laterality: Left;  . PERIPHERAL VASCULAR BALLOON ANGIOPLASTY Left 06/17/2019   Procedure: PERIPHERAL VASCULAR BALLOON ANGIOPLASTY;  Surgeon: Marty Heck, MD;  Location: [place] [facility];  Service: Cardiovascular;  Laterality: Left;  left proximal and mid bypass  . SHOULDER SURGERY Right   . TRANSMETATARSAL AMPUTATION Left 08/06/2019   Procedure: TRANSMETATARSAL AMPUTATION;  Surgeon: Landis Martins, DPM;  Location: [place];  Service: Podiatry;  Laterality: Left;  Available after 5pm   . VEIN HARVEST Left 01/27/2019   Procedure: Left Saphenous Vein Harvest;  Surgeon: Serafina Mitchell, MD;  Location: [place];  Service: Vascular;  Laterality:  Left;     No current facility-administered medications on file prior to encounter.   Current Outpatient Medications on File Prior to Encounter  Medication Sig Dispense Refill  . amLODipine (NORVASC) 10 MG tablet Take 1 tablet (10 mg total) by mouth daily. 30 tablet 0  . aspirin EC 81 MG tablet Take 81 mg by mouth daily. Swallow whole.    . clopidogrel (PLAVIX) 75 MG tablet Take 75 mg by mouth daily.    . cyclobenzaprine (FLEXERIL) 5 MG tablet Take 5 mg by mouth 3 (three) times daily as needed for muscle spasms.    . DULoxetine (CYMBALTA) 30 MG capsule Take 30 mg by mouth daily.    Marland Kitchen gabapentin (NEURONTIN) 600 MG tablet Take 600 mg by mouth 3 (three) times daily.    . hydroxypropyl methylcellulose / hypromellose (ISOPTO TEARS / GONIOVISC) 2.5 % ophthalmic solution Place 1 drop into both eyes daily as needed for dry eyes.    . insulin  aspart (NOVOLOG FLEXPEN) 100 UNIT/ML FlexPen Inject 6-8 Units into the skin See admin instructions. Inject 6-8 units into the skin two times a day (morning and early afternoon) as needed, per sliding scale: BGL 300-350 = 6 units; 351-400 = 8 units; >400 = CALL STAYWELL    . Insulin Degludec (TRESIBA) 100 UNIT/ML SOLN Inject 62 Units into the skin in the morning. Every morning to control blood sugar    . levocetirizine (XYZAL) 5 MG tablet Take 5 mg by mouth at bedtime.    Marland Kitchen lisinopril (ZESTRIL) 40 MG tablet Take 40 mg by mouth daily.    . metFORMIN (GLUCOPHAGE) 1000 MG tablet Take 1,000 mg by mouth 2 (two) times daily with a meal.    . metoprolol tartrate (LOPRESSOR) 25 MG tablet Take 25 mg by mouth 2 (two) times daily.    . nitroGLYCERIN (NITROSTAT) 0.4 MG SL tablet Place 0.4 mg under the tongue every 5 (five) minutes as needed for chest pain.    . pantoprazole (PROTONIX) 40 MG tablet Take 40 mg by mouth daily before breakfast.    . rosuvastatin (CRESTOR) 20 MG tablet Take 20 mg by mouth daily.    . sodium chloride 1 g tablet Take 2 g by mouth See admin instructions. Take 2 grams by mouth in the morning and 2 grams at bedtime    . Vitamin D, Ergocalciferol, (DRISDOL) 1.25 MG (50000 UNIT) CAPS capsule Take 50,000 Units by mouth 2 (two) times a week.       Allergies:   No Known Allergies  Social History:   Social History   Socioeconomic History  . Marital status: Married    Spouse name: Not on file  . Number of children: Not on file  . Years of education: Not on file  . Highest education level: Not on file  Occupational History  . Occupation: Disabled Administrator  Tobacco Use  . Smoking status: Former Smoker    Packs/day: 1.00    Years: 40.00    Pack years: 40.00    Types: Cigarettes    Quit date: 04/01/2009    Years since quitting: 11.3  . Smokeless tobacco: Never Used  Vaping Use  . Vaping Use: Never used  Substance and Sexual Activity  . Alcohol use: Not Currently  . Drug use:  Never  . Sexual activity: Not on file  Other Topics Concern  . Not on file  Social History Narrative   Lives with wife in [place] Determinants of Health  Financial Resource Strain: Not on file  Food Insecurity: Not on file  Transportation Needs: Not on file  Physical Activity: Not on file  Stress: Not on file  Social Connections: Not on file  Intimate Partner Violence: Not on file    Family History:   Denies any family history of heart disease.Mother and Father had cancer, unknown type. Both are deceased.   ROS:  Please see the history of present illness.  All other ROS reviewed and negative.     Physical Exam/Data:   Vitals:   07/26/2020 1406 07/03/2020 1435 07/13/2020 1445 07/27/2020 1450  BP:   128/65 128/65  Pulse: 96 96 99 99  Resp: (!) '21 16 16 17  '$ Temp:      TempSrc:      SpO2: (!) 88% 96% 97% 98%  Weight:      Height:       No intake or output data in the 24 hours ending 07/19/2020 1507 Last 3 Weights 07/03/2020 07/11/2020 08/04/2019  Weight (lbs) 242 lb 242 lb 215 lb  Weight (kg) 109.77 kg 109.77 kg 97.523 kg     Body mass index is 31.93 kg/m.   General: Obese man, appears to be uncomfortable lying in bed  HE: Normocephalic, atraumatic , EOMI, Conjunctivae normal ENT: No congestion, no rhinorrhea, no exudate or erythema  Cardiovascular: Normal rate, regular rhythm.  No murmurs, rubs, or gallops. No JVD, No Lower ext edema Pulmonary : Increased work of breathing, bilateral rales at bases  Abdominal: soft, nontender,  bowel sounds present Musculoskeletal: left foot amputated, tortures veins in bilateral legs Skin: Warm, dry  Psychiatric/Behavioral:  normal mood, normal behavior   EKG:  The EKG was personally reviewed and demonstrates: NSR , 93/min PR 24m QRS 120 QTc 472  normal axis.No ischemic changes  - Personally Reviewed  Relevant CV Studies:   Laboratory Data:  Chemistry Recent Labs  Lab 07/15/2020 1121  NA 132*  K 4.1  CL 100  CO2 23   GLUCOSE 249*  BUN 18  CREATININE 1.11  CALCIUM 8.8*  GFRNONAA >60  ANIONGAP 9    No results for input(s): PROT, ALBUMIN, AST, ALT, ALKPHOS, BILITOT in the last 168 hours. Hematology Recent Labs  Lab 07/22/2020 1121  WBC 7.0  RBC 4.74  HGB 12.9*  HCT 40.9  MCV 86.3  MCH 27.2  MCHC 31.5  RDW 14.6  PLT 236   Cardiac EnzymesNo results for input(s): TROPONINI in the last 168 hours. No results for input(s): TROPIPOC in the last 168 hours.  BNPNo results for input(s): BNP, PROBNP in the last 168 hours.  DDimer No results for input(s): DDIMER in the last 168 hours.  Radiology/Studies:  DG Chest 2 View  Result Date: 07/24/2020 CLINICAL DATA:  Shortness of breath. EXAM: CHEST - 2 VIEW COMPARISON:  01/17/2019. FINDINGS: Cardiomegaly. Mild bilateral interstitial prominence suggesting interstitial edema. Small right pleural effusion. No pneumothorax. Metallic densities again noted the left chest. Postsurgical changes right shoulder. Old left rib fractures again noted. IMPRESSION: Cardiomegaly. Mild bilateral interstitial prominence suggesting interstitial edema. Small right pleural effusion. Electronically Signed   By: TMarcello Moores Register   On: 07/19/2020 11:53      Assessment and Plan:   #Chest Pain and Shortness of Breath # Elevated Troponin #Acute on Chronic HF  Initial chest pain started last Thursday on the 14th.  Patient was sent for a stress test by PCP , [facility] (Myocardial imaging with SPECT yesterday revealed  reversibility involving the apex and apical segment of the inferolateral wall.  Fixed defect involving inferior and anteroseptal walls.  There is global hypokinesis.  And left ventricular ejection fraction of 24%). Echo 12/2018 showed EF 55% to 60%. Patient's shortness of breath. HS Troponin peaked 172> 149. BNP pending .  - Recent finding and history are concerning for possible ischemic event leading to acute heart failure  exacerbation. Plan to give one dose of lasix tonight to avoid verdure before LHC. Order placed for TEE and LHC. Will start GDMT with Coreg, can add additional medications after surgery.  - TTE - Coreg 6.25 BID - IV lasix 20 mg once - Lipid Panel  - Strict ins and outs - Daily Weights  -NPO at midnight, LHC tomorrow   #HTN Normotensive on exam. Home medication amlodipine 10 mg, Lisinopril 40 mg daily, metoprolol 25 mg twice daily - Hold metoprolol, lisinopril and amlodopine - Start Coreg   #Hyperlipidemia -Increase Crestor from 20 mg to  40 mg daily - Lipid Panel  #T2DM -Last hemoglobin A1c 08/05/2019, 11.6.  On review appears uncontrolled in the past as well. Home medications Tresiba 62 units daily, and a 6 to 8 units of aspart twice daily sliding scale - Lantus 40 units, SSI, QHS correction  #PAD -Continue aspirin and Plavix  For questions or updates, please contact [facility] Please consult [email or web address] for contact info under     Signed, Lorene Dy, MD  PGY2, IM 07/22/2020 3:07 PM  See attending attestation for final plan.

## 2020-07-21 ENCOUNTER — Inpatient Hospital Stay (HOSPITAL_COMMUNITY): Payer: No Typology Code available for payment source

## 2020-07-21 ENCOUNTER — Inpatient Hospital Stay (HOSPITAL_COMMUNITY): Admission: EM | Disposition: E | Payer: Self-pay | Source: Ambulatory Visit | Attending: Cardiothoracic Surgery

## 2020-07-21 ENCOUNTER — Other Ambulatory Visit (HOSPITAL_COMMUNITY): Payer: Self-pay

## 2020-07-21 ENCOUNTER — Encounter (HOSPITAL_COMMUNITY): Payer: Self-pay | Admitting: Internal Medicine

## 2020-07-21 DIAGNOSIS — I5021 Acute systolic (congestive) heart failure: Secondary | ICD-10-CM

## 2020-07-21 DIAGNOSIS — I25110 Atherosclerotic heart disease of native coronary artery with unstable angina pectoris: Secondary | ICD-10-CM | POA: Diagnosis not present

## 2020-07-21 DIAGNOSIS — E119 Type 2 diabetes mellitus without complications: Secondary | ICD-10-CM

## 2020-07-21 DIAGNOSIS — E785 Hyperlipidemia, unspecified: Secondary | ICD-10-CM

## 2020-07-21 HISTORY — PX: RIGHT/LEFT HEART CATH AND CORONARY ANGIOGRAPHY: CATH118266

## 2020-07-21 HISTORY — PX: INTRAVASCULAR PRESSURE WIRE/FFR STUDY: CATH118243

## 2020-07-21 LAB — CBC
HCT: 40 % (ref 39.0–52.0)
Hemoglobin: 12.8 g/dL — ABNORMAL LOW (ref 13.0–17.0)
MCH: 27.4 pg (ref 26.0–34.0)
MCHC: 32 g/dL (ref 30.0–36.0)
MCV: 85.7 fL (ref 80.0–100.0)
Platelets: 258 10*3/uL (ref 150–400)
RBC: 4.67 MIL/uL (ref 4.22–5.81)
RDW: 14.6 % (ref 11.5–15.5)
WBC: 6.9 10*3/uL (ref 4.0–10.5)
nRBC: 0 % (ref 0.0–0.2)

## 2020-07-21 LAB — POCT ACTIVATED CLOTTING TIME: Activated Clotting Time: 255 seconds

## 2020-07-21 LAB — ECHOCARDIOGRAM COMPLETE
AR max vel: 2.52 cm2
AV Area VTI: 2.37 cm2
AV Area mean vel: 2.53 cm2
AV Mean grad: 4.5 mmHg
AV Peak grad: 7.7 mmHg
Ao pk vel: 1.39 m/s
Area-P 1/2: 5.84 cm2
Height: 73 in
MV M vel: 5.34 m/s
MV Peak grad: 114.1 mmHg
MV VTI: 1.48 cm2
S' Lateral: 4.7 cm
Weight: 3796.8 oz

## 2020-07-21 LAB — POCT I-STAT 7, (LYTES, BLD GAS, ICA,H+H)
Acid-Base Excess: 0 mmol/L (ref 0.0–2.0)
Bicarbonate: 24.4 mmol/L (ref 20.0–28.0)
Calcium, Ion: 1.2 mmol/L (ref 1.15–1.40)
HCT: 35 % — ABNORMAL LOW (ref 39.0–52.0)
Hemoglobin: 11.9 g/dL — ABNORMAL LOW (ref 13.0–17.0)
O2 Saturation: 96 %
Potassium: 4 mmol/L (ref 3.5–5.1)
Sodium: 136 mmol/L (ref 135–145)
TCO2: 26 mmol/L (ref 22–32)
pCO2 arterial: 39 mmHg (ref 32.0–48.0)
pH, Arterial: 7.405 (ref 7.350–7.450)
pO2, Arterial: 79 mmHg — ABNORMAL LOW (ref 83.0–108.0)

## 2020-07-21 LAB — BASIC METABOLIC PANEL
Anion gap: 8 (ref 5–15)
BUN: 18 mg/dL (ref 8–23)
CO2: 27 mmol/L (ref 22–32)
Calcium: 9 mg/dL (ref 8.9–10.3)
Chloride: 98 mmol/L (ref 98–111)
Creatinine, Ser: 1.13 mg/dL (ref 0.61–1.24)
GFR, Estimated: >60 mL/min (ref >60)
Glucose, Bld: 175 mg/dL — ABNORMAL HIGH (ref 70–99)
Potassium: 4.3 mmol/L (ref 3.5–5.1)
Sodium: 133 mmol/L — ABNORMAL LOW (ref 135–145)

## 2020-07-21 LAB — LIPID PANEL
Cholesterol: 159 mg/dL (ref 0–200)
HDL: 35 mg/dL — ABNORMAL LOW (ref >40)
LDL Cholesterol: 86 mg/dL (ref 0–99)
Total CHOL/HDL Ratio: 4.5 RATIO
Triglycerides: 192 mg/dL — ABNORMAL HIGH (ref <150)
VLDL: 38 mg/dL (ref 0–40)

## 2020-07-21 LAB — POCT I-STAT EG7
Acid-Base Excess: 0 mmol/L (ref 0.0–2.0)
Bicarbonate: 26.1 mmol/L (ref 20.0–28.0)
Calcium, Ion: 1.18 mmol/L (ref 1.15–1.40)
HCT: 35 % — ABNORMAL LOW (ref 39.0–52.0)
Hemoglobin: 11.9 g/dL — ABNORMAL LOW (ref 13.0–17.0)
O2 Saturation: 60 %
Potassium: 4 mmol/L (ref 3.5–5.1)
Sodium: 137 mmol/L (ref 135–145)
TCO2: 27 mmol/L (ref 22–32)
pCO2, Ven: 45.2 mmHg (ref 44.0–60.0)
pH, Ven: 7.37 (ref 7.250–7.430)
pO2, Ven: 32 mmHg (ref 32.0–45.0)

## 2020-07-21 LAB — GLUCOSE, CAPILLARY
Glucose-Capillary: 167 mg/dL — ABNORMAL HIGH (ref 70–99)
Glucose-Capillary: 118 mg/dL — ABNORMAL HIGH (ref 70–99)
Glucose-Capillary: 181 mg/dL — ABNORMAL HIGH (ref 70–99)
Glucose-Capillary: 350 mg/dL — ABNORMAL HIGH (ref 70–99)

## 2020-07-21 SURGERY — RIGHT/LEFT HEART CATH AND CORONARY ANGIOGRAPHY
Anesthesia: LOCAL

## 2020-07-21 MED ORDER — ENOXAPARIN SODIUM 40 MG/0.4ML ~~LOC~~ SOLN
40.0000 mg | SUBCUTANEOUS | Status: DC
  Administered 2020-07-22 – 2020-07-24 (×3): 40 mg via SUBCUTANEOUS
  Filled 2020-07-21 (×3): qty 0.4

## 2020-07-21 MED ORDER — PERFLUTREN LIPID MICROSPHERE
1.0000 mL | INTRAVENOUS | Status: AC | PRN
  Administered 2020-07-21: 1 mL via INTRAVENOUS
  Filled 2020-07-21: qty 10

## 2020-07-21 MED ORDER — HEPARIN (PORCINE) IN NACL 1000-0.9 UT/500ML-% IV SOLN
INTRAVENOUS | Status: AC
  Filled 2020-07-21: qty 1000

## 2020-07-21 MED ORDER — MIDAZOLAM HCL 2 MG/2ML IJ SOLN
INTRAMUSCULAR | Status: AC
  Filled 2020-07-21: qty 2

## 2020-07-21 MED ORDER — LIDOCAINE HCL (PF) 1 % IJ SOLN
INTRAMUSCULAR | Status: AC
  Filled 2020-07-21: qty 30

## 2020-07-21 MED ORDER — MIDAZOLAM HCL 2 MG/2ML IJ SOLN
INTRAMUSCULAR | Status: DC | PRN
  Administered 2020-07-21: 1 mg via INTRAVENOUS

## 2020-07-21 MED ORDER — FUROSEMIDE 10 MG/ML IJ SOLN
INTRAMUSCULAR | Status: DC | PRN
  Administered 2020-07-21: 40 mg via INTRAVENOUS

## 2020-07-21 MED ORDER — VERAPAMIL HCL 2.5 MG/ML IV SOLN
INTRAVENOUS | Status: AC
  Filled 2020-07-21: qty 2

## 2020-07-21 MED ORDER — SODIUM CHLORIDE 0.9% FLUSH: 3.0000 mL | INTRAVENOUS | Status: DC | PRN

## 2020-07-21 MED ORDER — SODIUM CHLORIDE 0.9 % IV SOLN: 250.0000 mL | INTRAVENOUS | Status: DC | PRN

## 2020-07-21 MED ORDER — LIDOCAINE HCL (PF) 1 % IJ SOLN
INTRAMUSCULAR | Status: DC | PRN
  Administered 2020-07-21 (×2): 2 mL

## 2020-07-21 MED ORDER — FENTANYL CITRATE (PF) 100 MCG/2ML IJ SOLN
INTRAMUSCULAR | Status: DC | PRN
  Administered 2020-07-21: 25 ug via INTRAVENOUS

## 2020-07-21 MED ORDER — FENTANYL CITRATE (PF) 100 MCG/2ML IJ SOLN
INTRAMUSCULAR | Status: AC
  Filled 2020-07-21: qty 2

## 2020-07-21 MED ORDER — HYDRALAZINE HCL 20 MG/ML IJ SOLN: 10.0000 mg | INTRAMUSCULAR | Status: AC | PRN

## 2020-07-21 MED ORDER — SACUBITRIL-VALSARTAN 24-26 MG PO TABS
1.0000 | ORAL_TABLET | Freq: Two times a day (BID) | ORAL | Status: DC
  Administered 2020-07-21 – 2020-07-24 (×7): 1 via ORAL
  Filled 2020-07-21 (×7): qty 1

## 2020-07-21 MED ORDER — VERAPAMIL HCL 2.5 MG/ML IV SOLN
INTRAVENOUS | Status: DC | PRN
  Administered 2020-07-21: 10 mL via INTRA_ARTERIAL

## 2020-07-21 MED ORDER — HEPARIN SODIUM (PORCINE) 1000 UNIT/ML IJ SOLN
INTRAMUSCULAR | Status: DC | PRN
  Administered 2020-07-21: 2000 [IU] via INTRAVENOUS
  Administered 2020-07-21: 5000 [IU] via INTRAVENOUS
  Administered 2020-07-21: 6000 [IU] via INTRAVENOUS

## 2020-07-21 MED ORDER — SODIUM CHLORIDE 0.9% FLUSH
3.0000 mL | Freq: Two times a day (BID) | INTRAVENOUS | Status: DC
  Administered 2020-07-21 – 2020-07-24 (×7): 3 mL via INTRAVENOUS

## 2020-07-21 MED ORDER — SACUBITRIL-VALSARTAN 24-26 MG PO TABS: 1.0000 | ORAL_TABLET | Freq: Two times a day (BID) | ORAL | Status: DC

## 2020-07-21 MED ORDER — HEPARIN (PORCINE) IN NACL 1000-0.9 UT/500ML-% IV SOLN
INTRAVENOUS | Status: AC
  Filled 2020-07-21: qty 500

## 2020-07-21 MED ORDER — HEPARIN (PORCINE) IN NACL 1000-0.9 UT/500ML-% IV SOLN
INTRAVENOUS | Status: DC | PRN
  Administered 2020-07-21 (×2): 500 mL

## 2020-07-21 MED ORDER — HEPARIN SODIUM (PORCINE) 1000 UNIT/ML IJ SOLN
INTRAMUSCULAR | Status: AC
  Filled 2020-07-21: qty 1

## 2020-07-21 MED ORDER — IOHEXOL 350 MG/ML SOLN
INTRAVENOUS | Status: DC | PRN
  Administered 2020-07-21: 80 mL

## 2020-07-21 MED ORDER — NITROGLYCERIN 1 MG/10 ML FOR IR/CATH LAB
INTRA_ARTERIAL | Status: DC | PRN
  Administered 2020-07-21: 200 ug via INTRACORONARY

## 2020-07-21 MED ORDER — FUROSEMIDE 10 MG/ML IJ SOLN
40.0000 mg | Freq: Two times a day (BID) | INTRAMUSCULAR | Status: DC
  Administered 2020-07-21 – 2020-07-22 (×3): 40 mg via INTRAVENOUS
  Filled 2020-07-21 (×3): qty 4

## 2020-07-21 MED ORDER — NITROGLYCERIN 1 MG/10 ML FOR IR/CATH LAB
INTRA_ARTERIAL | Status: AC
  Filled 2020-07-21: qty 10

## 2020-07-21 SURGICAL SUPPLY — 15 items
CATH 5FR JL3.5 JR4 ANG PIG MP (CATHETERS) ×1 IMPLANT
CATH LAUNCHER 5F EBU3.5 (CATHETERS) ×1 IMPLANT
CATH SWAN GANZ 7F STRAIGHT (CATHETERS) ×1 IMPLANT
DEVICE RAD COMP TR BAND LRG (VASCULAR PRODUCTS) ×1 IMPLANT
GLIDESHEATH SLEND SS 6F .021 (SHEATH) ×1 IMPLANT
GLIDESHEATH SLENDER 7FR .021G (SHEATH) ×1 IMPLANT
GUIDEWIRE INQWIRE 1.5J.035X260 (WIRE) IMPLANT
GUIDEWIRE PRESSURE X 175 (WIRE) ×1 IMPLANT
INQWIRE 1.5J .035X260CM (WIRE) ×2
KIT ESSENTIALS PG (KITS) ×1 IMPLANT
KIT HEART LEFT (KITS) ×2 IMPLANT
PACK CARDIAC CATHETERIZATION (CUSTOM PROCEDURE TRAY) ×2 IMPLANT
SHEATH PROBE COVER 6X72 (BAG) ×1 IMPLANT
TRANSDUCER W/STOPCOCK (MISCELLANEOUS) ×2 IMPLANT
TUBING CIL FLEX 10 FLL-RA (TUBING) ×2 IMPLANT

## 2020-07-21 NOTE — Progress Notes (Addendum)
Dr. Percival Spanish requested to add right heart cath to procedure today. Notified cath lab, updated consent order and nurse and also spoke with patient to consent him for the addition of this procedure. Dr. Percival Spanish reviewed full consent yesterday. He is agreeable to proceed and eager when the lab is ready for him.  Pilar Plate with pharmacy also reached to pharmacy technician Dellis Filbert about cost coverage. Per Dellis Filbert the patient has a plan through [facility]) [address] [place], [place] [id number] Monday through Friday, 8 a.m. to 5 p.m. [phone] - will have a $0 copay but they will only cover certain doctors' prescriptions. Therefore the best option is to have patient go home with the free starter cards (30day free for [place], 30day if [place], 14day if [place]) and have the team reach out at discharge to this PACE program about providing refills. I spoke with Dr. Percival Spanish about med recs at this time and he said it was too early to define what patient's discharge regimen will be (may still be inpatient through Monday), so this will need to be taken care of when the patient is formally discharged.  Rasheed Welty PA-C

## 2020-07-21 NOTE — Progress Notes (Signed)
Progress Note  Patient Name: Danny Carpenter Date of Encounter: 07/29/2020  Primary Cardiologist:   No primary care provider on file.   Subjective   He denies chest pain.  He has had no acute SOB.  Feels OK on 2 liters.   Inpatient Medications    Scheduled Meds: . aspirin EC  81 mg Oral Daily  . carvedilol  6.25 mg Oral BID WC  . clopidogrel  75 mg Oral Daily  . DULoxetine  30 mg Oral Daily  . enoxaparin (LOVENOX) injection  40 mg Subcutaneous Q24H  . gabapentin  600 mg Oral TID  . insulin aspart  0-15 Units Subcutaneous TID WC  . insulin aspart  0-5 Units Subcutaneous QHS  . insulin glargine  40 Units Subcutaneous QHS  . pantoprazole  40 mg Oral QAC breakfast  . rosuvastatin  40 mg Oral Daily  . sodium chloride flush  3 mL Intravenous Q12H   Continuous Infusions: . sodium chloride    . sodium chloride     PRN Meds: sodium chloride, acetaminophen, nitroGLYCERIN, ondansetron (ZOFRAN) IV, perflutren lipid microspheres (DEFINITY) IV suspension, sodium chloride flush   Vital Signs    Vitals:   07/26/2020 1929 07/28/2020 0027 07/03/2020 0550 07/09/2020 0859  BP:  (!) 142/92 121/76 (!) 156/82  Pulse:  97 91 92  Resp: '16 19 15   '$ Temp: 99.1 F (37.3 C)  98.5 F (36.9 C)   TempSrc: Oral  Oral   SpO2:  94% 98%   Weight:   107.6 kg   Height:        Intake/Output Summary (Last 24 hours) at 07/08/2020 1110 Last data filed at 07/05/2020 0211 Gross per 24 hour  Intake --  Output 1175 ml  Net -1175 ml   Filed Weights   07/15/2020 1146 07/29/2020 0550  Weight: 109.8 kg 107.6 kg    Telemetry    NSR - Personally Reviewed  ECG    NA - Personally Reviewed  Physical Exam   GEN: No acute distress.   Neck: No  JVD Cardiac: RRR, no murmurs, rubs, or gallops.  Respiratory:     Few basilar crackles.  GI: Soft, nontender, non-distended  MS:    Mild leg edema; No deformity. Neuro:  Nonfocal  Psych: Normal affect   Labs    Chemistry Recent Labs  Lab 07/09/2020 1121  07/23/2020 1828 07/01/2020 0204  NA 132*  --  133*  K 4.1  --  4.3  CL 100  --  98  CO2 23  --  27  GLUCOSE 249*  --  175*  BUN 18  --  18  CREATININE 1.11 1.14 1.13  CALCIUM 8.8*  --  9.0  GFRNONAA >60 >60 >60  ANIONGAP 9  --  8     Hematology Recent Labs  Lab 07/03/2020 1121 07/06/2020 1828 07/09/2020 0204  WBC 7.0 8.0 6.9  RBC 4.74 4.93 4.67  HGB 12.9* 13.5 12.8*  HCT 40.9 42.4 40.0  MCV 86.3 86.0 85.7  MCH 27.2 27.4 27.4  MCHC 31.5 31.8 32.0  RDW 14.6 14.8 14.6  PLT 236 281 258    Cardiac EnzymesNo results for input(s): TROPONINI in the last 168 hours. No results for input(s): TROPIPOC in the last 168 hours.   BNP Recent Labs  Lab 07/19/2020 1211  BNP 465.5*     DDimer No results for input(s): DDIMER in the last 168 hours.   Radiology    DG Chest 2 View  Result Date: 07/06/2020  CLINICAL DATA:  Shortness of breath. EXAM: CHEST - 2 VIEW COMPARISON:  01/17/2019. FINDINGS: Cardiomegaly. Mild bilateral interstitial prominence suggesting interstitial edema. Small right pleural effusion. No pneumothorax. Metallic densities again noted the left chest. Postsurgical changes right shoulder. Old left rib fractures again noted. IMPRESSION: Cardiomegaly. Mild bilateral interstitial prominence suggesting interstitial edema. Small right pleural effusion. Electronically Signed   By: Marcello Moores  Register   On: 07/12/2020 11:53    Cardiac Studies   Echo:  Prelim.  Poor windows.  EF about 20% with the base moving but otherwise a more global hypokinesis or possibly more apical.  Final reading pending.   Images reviewed personally.   Patient Profile     70 y.o. male with a hx of HTN, HLD, uncontrolled T2DM, CVA (remote lacunar infarct left cerebellum 10//18/20 MRI), AAA, Left foot ampuation, PAD s/p fem pop bypass, and heart failure  who is being seen for the evaluation of chest pain and shortness of breath at the request of Dr.Ray.  Assessment & Plan    Acute systolic HF:  Suspect  ischemic.  Cath today.  Right and left.  Started Coreg yesterday and I will start Entresto today.    Can determine dose of Lasix based on pressures at cath.    Dyslipidemia:   Increased statin this admission.     DM:  AIC 11.6.  Will benefit from Farxiga/Jardiance before discharge.    For questions or updates, please contact [employer] Please consult [email or web address] for contact info under Cardiology/STEMI.   Signed, Minus Breeding, MD  07/28/2020, 11:10 AM

## 2020-07-21 NOTE — Interval H&P Note (Signed)
History and Physical Interval Note:  07/12/2020 12:31 PM  Danny Carpenter  has presented today for surgery, with the diagnosis of acute HFrEF and unstable angina.  The various methods of treatment have been discussed with the patient and family. After consideration of risks, benefits and other options for treatment, the patient has consented to  Procedure(s): RIGHT/LEFT HEART CATH AND CORONARY ANGIOGRAPHY (N/A) as a surgical intervention.  The patient's history has been reviewed, patient examined, no change in status, stable for surgery.  I have reviewed the patient's chart and labs.  Questions were answered to the patient's satisfaction.    Cath Lab Visit (complete for each Cath Lab visit)  Clinical Evaluation Leading to the Procedure:   ACS: Yes.    Non-ACS:  N/A  Lorne Winkels

## 2020-07-21 NOTE — H&P (View-Only) (Signed)
Progress Note  Patient Name: Danny Carpenter Date of Encounter: 07/16/2020  Primary Cardiologist:   No primary care provider on file.   Subjective   He denies chest pain.  He has had no acute SOB.  Feels OK on 2 liters.   Inpatient Medications    Scheduled Meds: . aspirin EC  81 mg Oral Daily  . carvedilol  6.25 mg Oral BID WC  . clopidogrel  75 mg Oral Daily  . DULoxetine  30 mg Oral Daily  . enoxaparin (LOVENOX) injection  40 mg Subcutaneous Q24H  . gabapentin  600 mg Oral TID  . insulin aspart  0-15 Units Subcutaneous TID WC  . insulin aspart  0-5 Units Subcutaneous QHS  . insulin glargine  40 Units Subcutaneous QHS  . pantoprazole  40 mg Oral QAC breakfast  . rosuvastatin  40 mg Oral Daily  . sodium chloride flush  3 mL Intravenous Q12H   Continuous Infusions: . sodium chloride    . sodium chloride     PRN Meds: sodium chloride, acetaminophen, nitroGLYCERIN, ondansetron (ZOFRAN) IV, perflutren lipid microspheres (DEFINITY) IV suspension, sodium chloride flush   Vital Signs    Vitals:   07/10/2020 1929 07/05/2020 0027 07/12/2020 0550 07/26/2020 0859  BP:  (!) 142/92 121/76 (!) 156/82  Pulse:  97 91 92  Resp: '16 19 15   '$ Temp: 99.1 F (37.3 C)  98.5 F (36.9 C)   TempSrc: Oral  Oral   SpO2:  94% 98%   Weight:   107.6 kg   Height:        Intake/Output Summary (Last 24 hours) at 07/07/2020 1110 Last data filed at 07/28/2020 0211 Gross per 24 hour  Intake --  Output 1175 ml  Net -1175 ml   Filed Weights   07/12/2020 1146 07/12/2020 0550  Weight: 109.8 kg 107.6 kg    Telemetry    NSR - Personally Reviewed  ECG    NA - Personally Reviewed  Physical Exam   GEN: No acute distress.   Neck: No  JVD Cardiac: RRR, no murmurs, rubs, or gallops.  Respiratory:     Few basilar crackles.  GI: Soft, nontender, non-distended  MS:    Mild leg edema; No deformity. Neuro:  Nonfocal  Psych: Normal affect   Labs    Chemistry Recent Labs  Lab 07/19/2020 1121  07/24/2020 1828 07/04/2020 0204  NA 132*  --  133*  K 4.1  --  4.3  CL 100  --  98  CO2 23  --  27  GLUCOSE 249*  --  175*  BUN 18  --  18  CREATININE 1.11 1.14 1.13  CALCIUM 8.8*  --  9.0  GFRNONAA >60 >60 >60  ANIONGAP 9  --  8     Hematology Recent Labs  Lab 07/09/2020 1121 07/15/2020 1828 07/07/2020 0204  WBC 7.0 8.0 6.9  RBC 4.74 4.93 4.67  HGB 12.9* 13.5 12.8*  HCT 40.9 42.4 40.0  MCV 86.3 86.0 85.7  MCH 27.2 27.4 27.4  MCHC 31.5 31.8 32.0  RDW 14.6 14.8 14.6  PLT 236 281 258    Cardiac EnzymesNo results for input(s): TROPONINI in the last 168 hours. No results for input(s): TROPIPOC in the last 168 hours.   BNP Recent Labs  Lab 07/29/2020 1211  BNP 465.5*     DDimer No results for input(s): DDIMER in the last 168 hours.   Radiology    DG Chest 2 View  Result Date: 07/23/2020  CLINICAL DATA:  Shortness of breath. EXAM: CHEST - 2 VIEW COMPARISON:  01/17/2019. FINDINGS: Cardiomegaly. Mild bilateral interstitial prominence suggesting interstitial edema. Small right pleural effusion. No pneumothorax. Metallic densities again noted the left chest. Postsurgical changes right shoulder. Old left rib fractures again noted. IMPRESSION: Cardiomegaly. Mild bilateral interstitial prominence suggesting interstitial edema. Small right pleural effusion. Electronically Signed   By: Marcello Moores  Register   On: 07/27/2020 11:53    Cardiac Studies   Echo:  Prelim.  Poor windows.  EF about 20% with the base moving but otherwise a more global hypokinesis or possibly more apical.  Final reading pending.   Images reviewed personally.   Patient Profile     70 y.o. male with a hx of HTN, HLD, uncontrolled T2DM, CVA (remote lacunar infarct left cerebellum 10//18/20 MRI), AAA, Left foot ampuation, PAD s/p fem pop bypass, and heart failure  who is being seen for the evaluation of chest pain and shortness of breath at the request of Dr.Ray.  Assessment & Plan    Acute systolic HF:  Suspect  ischemic.  Cath today.  Right and left.  Started Coreg yesterday and I will start Entresto today.    Can determine dose of Lasix based on pressures at cath.    Dyslipidemia:   Increased statin this admission.     DM:  AIC 11.6.  Will benefit from Farxiga/Jardiance before discharge.    For questions or updates, please contact [place] Please consult [email or web address] for contact info under Cardiology/STEMI.   Signed, Minus Breeding, MD  07/02/2020, 11:10 AM

## 2020-07-21 NOTE — Progress Notes (Signed)
*  PRELIMINARY RESULTS* Echocardiogram 2D Echocardiogram has been performed with Definity  Luisa Hart 07/06/2020, 10:26 AM

## 2020-07-21 NOTE — Progress Notes (Signed)
TCTS consulted for CABG evaluation. °

## 2020-07-21 NOTE — Progress Notes (Signed)
Inpatient Diabetes Program Recommendations  AACE/ADA: New Consensus Statement on Inpatient Glycemic Control (2015)  Target Ranges:  Prepandial:   less than 140 mg/dL      Peak postprandial:   less than 180 mg/dL (1-2 hours)      Critically ill patients:  140 - 180 mg/dL   Lab Results  Component Value Date   GLUCAP 118 (H) 07/05/2020   HGBA1C 11.6 (H) 07/03/2020    Review of Glycemic Control  Diabetes history: DM 2 Outpatient Diabetes medications: Tresiba 62 units, Metformin 1000 mg bid Current orders for Inpatient glycemic control:  Latus 40 units Novolog 0-15 units tid + hs  Went to cath lab today A1c 11.6 this admission. Pt reports this is down from 13% and says he has been working on his glucose levels with Dr. Rolene Arbour.   Spoke with pt at bedside. Discussed current A1c level and glucose control at home. Pt goes to his PCP often for diabetes follow up. Pt reports he has changed his diet to reduce portion sizes and uses whole wheat breads and pastas. Pt reports drinking water and diet sodas. Pt reports rarely having a low glucose but reports feeling jittery/nervous when he does have hypoglycemia.  Discussed the need for glucose control. Mentioned for pt to get the Freestyle libre Continuous glucose monitor to see what his trends are doing so his doctor can better prescribe insulin for glucose control. Also explained he may need an Endocrinologist to manage his Diabetes. Pt lives in [place].   Will follow pt while inpatient.  Thanks,  Tama Headings RN, MSN, BC-ADM Inpatient Diabetes Coordinator Team Pager [phone] (8a-5p)

## 2020-07-21 NOTE — TOC Benefit Eligibility Note (Signed)
Patient Teacher, English as a foreign language completed.    The patient is currently admitted and upon discharge could be taking Entresto '24mg'$ /'26mg'$ .  The current 30 day co-pay is, $0.00.   The patient is currently admitted and upon discharge could be taking Farxiga '10mg'$ .  The current 30 day co-pay is, $0.00.   The patient is currently admitted and upon discharge could be taking Jardiance '10mg'$ .  The current 30 day co-pay is, $0.00.   The patient is insured through   [facility]) [address] [place], [place] [id number]  [phone].    Lyndel Safe, [place] Patient Advocate Specialist [place] Antimicrobial Stewardship Team Direct Number: [phone]  Fax: [phone]

## 2020-07-22 DIAGNOSIS — I10 Essential (primary) hypertension: Secondary | ICD-10-CM | POA: Diagnosis not present

## 2020-07-22 DIAGNOSIS — I5043 Acute on chronic combined systolic (congestive) and diastolic (congestive) heart failure: Secondary | ICD-10-CM | POA: Diagnosis not present

## 2020-07-22 DIAGNOSIS — I493 Ventricular premature depolarization: Secondary | ICD-10-CM | POA: Diagnosis not present

## 2020-07-22 DIAGNOSIS — I214 Non-ST elevation (NSTEMI) myocardial infarction: Secondary | ICD-10-CM | POA: Diagnosis not present

## 2020-07-22 LAB — GLUCOSE, CAPILLARY
Glucose-Capillary: 281 mg/dL — ABNORMAL HIGH (ref 70–99)
Glucose-Capillary: 270 mg/dL — ABNORMAL HIGH (ref 70–99)
Glucose-Capillary: 189 mg/dL — ABNORMAL HIGH (ref 70–99)
Glucose-Capillary: 356 mg/dL — ABNORMAL HIGH (ref 70–99)

## 2020-07-22 LAB — BASIC METABOLIC PANEL WITH GFR
Anion gap: 9 (ref 5–15)
BUN: 24 mg/dL — ABNORMAL HIGH (ref 8–23)
CO2: 27 mmol/L (ref 22–32)
Calcium: 9 mg/dL (ref 8.9–10.3)
Chloride: 98 mmol/L (ref 98–111)
Creatinine, Ser: 1.19 mg/dL (ref 0.61–1.24)
GFR, Estimated: >60 mL/min
Glucose, Bld: 218 mg/dL — ABNORMAL HIGH (ref 70–99)
Potassium: 3.8 mmol/L (ref 3.5–5.1)
Sodium: 134 mmol/L — ABNORMAL LOW (ref 135–145)

## 2020-07-22 LAB — CBC
HCT: 37.6 % — ABNORMAL LOW (ref 39.0–52.0)
Hemoglobin: 12 g/dL — ABNORMAL LOW (ref 13.0–17.0)
MCH: 27.3 pg (ref 26.0–34.0)
MCHC: 31.9 g/dL (ref 30.0–36.0)
MCV: 85.5 fL (ref 80.0–100.0)
Platelets: 238 10*3/uL (ref 150–400)
RBC: 4.4 MIL/uL (ref 4.22–5.81)
RDW: 14.6 % (ref 11.5–15.5)
WBC: 6.5 10*3/uL (ref 4.0–10.5)
nRBC: 0 % (ref 0.0–0.2)

## 2020-07-22 LAB — MAGNESIUM: Magnesium: 2 mg/dL (ref 1.7–2.4)

## 2020-07-22 MED ORDER — POTASSIUM CHLORIDE CRYS ER 20 MEQ PO TBCR
20.0000 meq | EXTENDED_RELEASE_TABLET | Freq: Once | ORAL | Status: AC
  Administered 2020-07-22: 20 meq via ORAL
  Filled 2020-07-22: qty 1

## 2020-07-22 NOTE — Progress Notes (Addendum)
Progress Note  Patient Name: Danny Carpenter Date of Encounter: 07/22/2020  Primary Cardiologist:   No primary care provider on file.   Subjective   Denies any chest pain, reports dyspnea has improved  Inpatient Medications    Scheduled Meds: . aspirin EC  81 mg Oral Daily  . carvedilol  6.25 mg Oral BID WC  . DULoxetine  30 mg Oral Daily  . enoxaparin (LOVENOX) injection  40 mg Subcutaneous Q24H  . furosemide  40 mg Intravenous BID  . gabapentin  600 mg Oral TID  . insulin aspart  0-15 Units Subcutaneous TID WC  . insulin aspart  0-5 Units Subcutaneous QHS  . insulin glargine  40 Units Subcutaneous QHS  . pantoprazole  40 mg Oral QAC breakfast  . rosuvastatin  40 mg Oral Daily  . sacubitril-valsartan  1 tablet Oral BID  . sodium chloride flush  3 mL Intravenous Q12H  . sodium chloride flush  3 mL Intravenous Q12H   Continuous Infusions: . sodium chloride     PRN Meds: sodium chloride, acetaminophen, nitroGLYCERIN, ondansetron (ZOFRAN) IV, sodium chloride flush   Vital Signs          Vitals:   07/02/2020 2210 07/14/2020 2330 07/18/2020 2334 07/22/20 0630  BP: 137/80 (!) 146/87 (!) 146/87 108/64  Pulse: 79 99 85 80  Resp:   20 20  Temp:   98.8 F (37.1 C) 97.8 F (36.6 C)  TempSrc:   Oral Oral  SpO2: 94% 100% 100% 95%  Weight:    107.2 kg  Height:       No intake or output data in the 24 hours ending 07/22/20 0754      Filed Weights   07/28/2020 1146 07/04/2020 0550 07/22/20 0630  Weight: 109.8 kg 107.6 kg 107.2 kg    Telemetry    NSR, frequent PVCs - Personally Reviewed  ECG    NA - Personally Reviewed  Physical Exam   GEN:No acute distress.   Neck:No  JVD Cardiac:RRR, no murmurs, rubs, or gallops.  Respiratory:    CTAB  [id number], nontender, non-distended  MS:   1+ BLE edema Neuro:Nonfocal  Psych: Normal affect   Labs    Chemistry Last Labs           Recent Labs  Lab 07/16/2020 1121 07/13/2020 1828  07/24/2020 0204 07/12/2020 1248 07/08/2020 1255 07/22/20 0246  NA 132*  --  133* 137 136 134*  K 4.1  --  4.3 4.0 4.0 3.8  CL 100  --  98  --   --  98  CO2 23  --  27  --   --  27  GLUCOSE 249*  --  175*  --   --  218*  BUN 18  --  18  --   --  24*  CREATININE 1.11 1.14 1.13  --   --  1.19  CALCIUM 8.8*  --  9.0  --   --  9.0  GFRNONAA >60 >60 >60  --   --  >60  ANIONGAP 9  --  8  --   --  9       Hematology Last Labs          Recent Labs  Lab 07/13/2020 1828 07/15/2020 0204 07/24/2020 1248 07/13/2020 1255 07/22/20 0246  WBC 8.0 6.9  --   --  6.5  RBC 4.93 4.67  --   --  4.40  HGB 13.5 12.8* 11.9* 11.9* 12.0*  HCT 42.4  40.0 35.0* 35.0* 37.6*  MCV 86.0 85.7  --   --  85.5  MCH 27.4 27.4  --   --  27.3  MCHC 31.8 32.0  --   --  31.9  RDW 14.8 14.6  --   --  14.6  PLT 281 258  --   --  238      Cardiac Enzymes Last Labs   No results for input(s): TROPONINI in the last 168 hours.    Last Labs   No results for input(s): TROPIPOC in the last 168 hours.     BNP Last Labs      Recent Labs  Lab 07/13/2020 1211  BNP 465.5*       DDimer  Last Labs   No results for input(s): DDIMER in the last 168 hours.     Radiology     Imaging Results (Last 48 hours)  DG Chest 2 View  Result Date: 07/26/2020 CLINICAL DATA:  Shortness of breath. EXAM: CHEST - 2 VIEW COMPARISON:  01/17/2019. FINDINGS: Cardiomegaly. Mild bilateral interstitial prominence suggesting interstitial edema. Small right pleural effusion. No pneumothorax. Metallic densities again noted the left chest. Postsurgical changes right shoulder. Old left rib fractures again noted. IMPRESSION: Cardiomegaly. Mild bilateral interstitial prominence suggesting interstitial edema. Small right pleural effusion. Electronically Signed   By: Marcello Moores  Register   On: 06/30/2020 11:53   CT CHEST WO CONTRAST  Result Date: 07/24/2020 CLINICAL DATA:  Thoracic aortic Disease. Preoperative planning for coronary artery bypass.  EXAM: CT CHEST WITHOUT CONTRAST TECHNIQUE: Multidetector CT imaging of the chest was performed following the standard protocol without IV contrast. COMPARISON:  03/22/2019 FINDINGS: Cardiovascular: Mild cardiac enlargement. No pericardial effusions. Calcification in the aorta, coronary arteries, mitral valve annulus. Normal caliber thoracic aorta. Mediastinum/Nodes: Prominent lymph nodes in the mediastinum without pathologic enlargement, mildly increased since prior study. Nonspecific but likely reactive. Esophagus is decompressed. Lungs/Pleura: Small bilateral pleural effusions with basilar atelectasis, new since prior study. Patchy airspace disease and interstitial septal thickening throughout the lungs with a mostly central distribution. This is new since prior study. Changes most likely to represent multifocal pneumonia. No pneumothorax. Upper Abdomen: No acute process demonstrated in the visualized upper abdomen. Musculoskeletal: Degenerative changes in the spine. Multiple old rib fractures, some ununited. IMPRESSION: 1. Mild cardiac enlargement. 2. Small bilateral pleural effusions with basilar atelectasis, new since prior study. 3. Patchy airspace disease and interstitial septal thickening throughout the lungs with a mostly central distribution. New since prior study. Changes most likely to represent multifocal pneumonia. Edema less likely. 4. Prominent lymph nodes in the mediastinum without pathologic enlargement, mildly increased since prior study. Nonspecific but likely reactive. 5. Aortic and coronary artery atherosclerosis. 6. Multiple old rib fractures, some ununited. Aortic Atherosclerosis (ICD10-I70.0). Electronically Signed   By: Lucienne Capers M.D.   On: 07/06/2020 20:10   CARDIAC CATHETERIZATION  Result Date: 07/15/2020 Conclusions: 1. Severe three-vessel coronary artery disease, including moderate diffuse LMCA disease that is not hemodynamically significant by RFR,  long segment of  proximal/mid LAD disease of up to 70% that is functionally significant (RFR = 0.61), chronically occluded mid LCx, and 90% proximal RCA stenosis with heavy calcification as well at 50% distal vessel disease. 2. Moderately elevated left heart and pulmonary artery pressures. 3. Mildly elevated right heart filling pressures. 4. Mildly reduced cardiac output/index. Recommendations: 1. Cardiac surgery consultation for CABG in the setting of three-vessel CAD, low LVEF, and diabetes mellitus. 2. Initiate diuresis with escalation of goal-directed medical therapy for  heart failure as tolerated. 3. Hold clopidogrel pending cardiac surgery consultation. 4. Aggressive secondary prevention of coronary artery disease. Nelva Bush, MD [facility]   ECHOCARDIOGRAM COMPLETE  Result Date: 07/28/2020    ECHOCARDIOGRAM REPORT   Patient Name:   [facility] Date of Exam: 07/09/2020 Medical Rec #:  [id number]     Height:       73.0 in Accession #:    [id number]    Weight:       237.3 lb Date of Birth:  November 06, 1950    BSA:          2.314 m Patient Age:    70 years      BP:           121/76 mmHg Patient Gender: M             HR:           83 bpm. Exam Location:  Inpatient Procedure: 2D Echo, Cardiac Doppler, Color Doppler and Intracardiac            Opacification Agent Indications:    CHF  History:        Patient has prior history of Echocardiogram examinations, most                 recent 12/31/1818. Previous Myocardial Infarction, Stroke and                 COPD; Risk Factors:Diabetes, Dyslipidemia and Hypertension.  Sonographer:    Luisa Hart RDCS Referring Phys: [name]  Sonographer Comments: Suboptimal apical window. Image acquisition challenging due to patient body habitus. 01/19/19 Abdominal aortogram 07/03/2020 cath IMPRESSIONS  1. Left ventricular ejection fraction, by estimation, is 25%. The left ventricle has severely decreased function. The left ventricle demonstrates global hypokinesis. The left  ventricular internal cavity size was mildly dilated. Left ventricular diastolic parameters are consistent with Grade III diastolic dysfunction (restrictive).  2. Right ventricular systolic function is mildly reduced. The right ventricular size is normal. Tricuspid regurgitation signal is inadequate for assessing PA pressure.  3. Left atrial size was mildly dilated.  4. The mitral valve is degenerative. Mild mitral valve regurgitation. No evidence of mitral stenosis. Moderate mitral annular calcification.  5. The aortic valve is tricuspid. Aortic valve regurgitation is not visualized. Mild to moderate aortic valve sclerosis/calcification is present, without any evidence of aortic stenosis.  6. The inferior vena cava is dilated in size with <50% respiratory variability, suggesting right atrial pressure of 15 mmHg. FINDINGS  Left Ventricle: Left ventricular ejection fraction, by estimation, is 25%. The left ventricle has severely decreased function. The left ventricle demonstrates global hypokinesis. Definity contrast agent was given IV to delineate the left ventricular endocardial borders. The left ventricular internal cavity size was mildly dilated. There is no left ventricular hypertrophy. Left ventricular diastolic parameters are consistent with Grade III diastolic dysfunction (restrictive). Right Ventricle: The right ventricular size is normal. No increase in right ventricular wall thickness. Right ventricular systolic function is mildly reduced. Tricuspid regurgitation signal is inadequate for assessing PA pressure. Left Atrium: Left atrial size was mildly dilated. Right Atrium: Right atrial size was normal in size. Pericardium: There is no evidence of pericardial effusion. Mitral Valve: The mitral valve is degenerative in appearance. There is mild calcification of the mitral valve leaflet(s). Moderate mitral annular calcification. Mild mitral valve regurgitation. No evidence of mitral valve stenosis. MV peak  gradient, 8.2 mmHg. The mean mitral valve gradient is 3.0 mmHg. Tricuspid Valve: The tricuspid  valve is normal in structure. Tricuspid valve regurgitation is not demonstrated. Aortic Valve: The aortic valve is tricuspid. Aortic valve regurgitation is not visualized. Mild to moderate aortic valve sclerosis/calcification is present, without any evidence of aortic stenosis. Aortic valve mean gradient measures 4.5 mmHg. Aortic valve peak gradient measures 7.7 mmHg. Aortic valve area, by VTI measures 2.37 cm. Pulmonic Valve: The pulmonic valve was normal in structure. Pulmonic valve regurgitation is not visualized. Aorta: The aortic root is normal in size and structure. Venous: The inferior vena cava is dilated in size with less than 50% respiratory variability, suggesting right atrial pressure of 15 mmHg. IAS/Shunts: No atrial level shunt detected by color flow Doppler.  LEFT VENTRICLE PLAX 2D LVIDd:         5.90 cm  Diastology LVIDs:         4.70 cm  LV e' medial:    3.26 cm/s LV PW:         1.40 cm  LV E/e' medial:  39.0 LV IVS:        1.50 cm  LV e' lateral:   5.74 cm/s LVOT diam:     2.60 cm  LV E/e' lateral: 22.1 LV SV:         61 LV SV Index:   26 LVOT Area:     5.31 cm  RIGHT VENTRICLE RV S prime:     9.65 cm/s TAPSE (M-mode): 1.5 cm LEFT ATRIUM             Index       RIGHT ATRIUM           Index LA diam:        4.80 cm 2.07 cm/m  RA Area:     13.70 cm LA Vol (A2C):   68.7 ml 29.69 ml/m RA Volume:   36.30 ml  15.69 ml/m LA Vol (A4C):   91.0 ml 39.33 ml/m LA Biplane Vol: 84.0 ml 36.30 ml/m  AORTIC VALVE                   PULMONIC VALVE AV Area (Vmax):    2.52 cm    PV Vmax:       0.88 m/s AV Area (Vmean):   2.53 cm    PV Vmean:      63.100 cm/s AV Area (VTI):     2.37 cm    PV VTI:        0.194 m AV Vmax:           139.00 cm/s PV Peak grad:  3.1 mmHg AV Vmean:          96.000 cm/s PV Mean grad:  2.0 mmHg AV VTI:            0.258 m AV Peak Grad:      7.7 mmHg AV Mean Grad:      4.5 mmHg LVOT Vmax:          66.10 cm/s LVOT Vmean:        45.800 cm/s LVOT VTI:          0.115 m LVOT/AV VTI ratio: 0.45  AORTA Ao Root diam: 3.10 cm Ao Asc diam:  3.10 cm MITRAL VALVE MV Area (PHT): 5.84 cm     SHUNTS MV Area VTI:   1.48 cm     Systemic VTI:  0.12 m MV Peak grad:  8.2 mmHg     Systemic Diam: 2.60 cm MV Mean grad:  3.0 mmHg MV Vmax:  1.43 m/s MV Vmean:      69.8 cm/s MV Decel Time: 130 msec MR Peak grad: 114.1 mmHg MR Mean grad: 67.0 mmHg MR Vmax:      534.00 cm/s MR Vmean:     381.0 cm/s MV E velocity: 127.00 cm/s MV A velocity: 64.00 cm/s MV E/A ratio:  1.98 Loralie Champagne MD Electronically signed by Loralie Champagne MD Signature Date/Time: 07/06/2020/4:45:14 PM    Final      Cardiac Studies   Echo:  Prelim.  Poor windows.  EF about 20% with the base moving but otherwise a more global hypokinesis or possibly more apical.  Final reading pending.   Images reviewed personally.   Patient Profile     70 y.o. male with a hx of HTN, HLD, uncontrolled T2DM, CVA (remote lacunar infarct left cerebellum 10//18/20 MRI), AAA, Left foot ampuation, PAD s/p fem pop bypass, and heart failure who is being seen for the evaluation of chest pain and shortness of breath   Assessment & Plan    NSTEMI: Presented with chest pain, mild troponin elevation to 172.  LHC/RHC on 4/22 showed severe three-vessel CAD: Moderate diffuse left main stenosis not significant by RFR, proximal to mid LAD 70% stenosis that is significant by RFR (0.61), chronically occluded mid LCx, 90% proximal RCA. -Cardiac surgery consult for CABG evaluation -Continue aspirin 81 mg daily.  Was on Plavix as outpatient, has been discontinued, last dose 4/22 -Continue carvedilol 6.25 mg twice daily -Continue rosuvastatin 40 mg daily  Acute combined systolic and diastolic HF: Echocardiogram on 4/22 shows LVEF 25%, global hypokinesis, grade 3 diastolic dysfunction, mild RV dysfunction, no significant valvular disease.  RHC on 4/22 shows RA 9, RV 55/10, PA  55/30/38, PCWP 25, PA sat 60%, Fick CI 2.1, thermodilution CI 2.3 -Filling pressures elevated on RHC 4/22, continue IV Lasix 40 mg twice daily -Strict I's and O's and daily weights -Continue Entresto 24-26 mg twice daily -Continue carvedilol 6.25 mg twice daily -Will add Aldactone 12.5 mg daily if stable renal function post cath and BP stable  LDL 86 on 07/19/2020.  Rosuvastatin increased from 20 to 40 mg for goal LDL less than 70  DM:  AIC 11.6.  Will benefit from Farxiga/Jardiance before discharge.  Appreciate diabetes coordinator Rex, continue Lantus 40 units daily and NovoLog SSI  Hypertension: On amlodipine 10 mg daily, lisinopril 40 mg daily at home. -Holding amlodipine and lisinopril -Started carvedilol and Entresto as above.  Plan to add Aldactone as above  Frequent PVCs: Maintain K greater than 4, mag greater than 2   For questions or updates, please contact [facility] Please consult [email or web address] contactinfo under Cardiology/STEMI.  Signed, Donato Heinz, MD  07/22/2020, 7:54 AM

## 2020-07-23 DIAGNOSIS — I10 Essential (primary) hypertension: Secondary | ICD-10-CM | POA: Diagnosis not present

## 2020-07-23 DIAGNOSIS — I5043 Acute on chronic combined systolic (congestive) and diastolic (congestive) heart failure: Secondary | ICD-10-CM | POA: Diagnosis not present

## 2020-07-23 DIAGNOSIS — I214 Non-ST elevation (NSTEMI) myocardial infarction: Secondary | ICD-10-CM | POA: Diagnosis not present

## 2020-07-23 LAB — CBC
HCT: 36.4 % — ABNORMAL LOW (ref 39.0–52.0)
Hemoglobin: 11.6 g/dL — ABNORMAL LOW (ref 13.0–17.0)
MCH: 27.4 pg (ref 26.0–34.0)
MCHC: 31.9 g/dL (ref 30.0–36.0)
MCV: 85.8 fL (ref 80.0–100.0)
Platelets: 239 10*3/uL (ref 150–400)
RBC: 4.24 MIL/uL (ref 4.22–5.81)
RDW: 14.7 % (ref 11.5–15.5)
WBC: 5.1 10*3/uL (ref 4.0–10.5)
nRBC: 0 % (ref 0.0–0.2)

## 2020-07-23 LAB — GLUCOSE, CAPILLARY
Glucose-Capillary: 306 mg/dL — ABNORMAL HIGH (ref 70–99)
Glucose-Capillary: 250 mg/dL — ABNORMAL HIGH (ref 70–99)
Glucose-Capillary: 282 mg/dL — ABNORMAL HIGH (ref 70–99)
Glucose-Capillary: 292 mg/dL — ABNORMAL HIGH (ref 70–99)

## 2020-07-23 LAB — BASIC METABOLIC PANEL
Anion gap: 8 (ref 5–15)
BUN: 30 mg/dL — ABNORMAL HIGH (ref 8–23)
CO2: 25 mmol/L (ref 22–32)
Calcium: 8.9 mg/dL (ref 8.9–10.3)
Chloride: 97 mmol/L — ABNORMAL LOW (ref 98–111)
Creatinine, Ser: 1.33 mg/dL — ABNORMAL HIGH (ref 0.61–1.24)
GFR, Estimated: 58 mL/min — ABNORMAL LOW (ref >60)
Glucose, Bld: 351 mg/dL — ABNORMAL HIGH (ref 70–99)
Potassium: 4.1 mmol/L (ref 3.5–5.1)
Sodium: 130 mmol/L — ABNORMAL LOW (ref 135–145)

## 2020-07-23 LAB — MAGNESIUM: Magnesium: 2.1 mg/dL (ref 1.7–2.4)

## 2020-07-23 MED ORDER — DAPAGLIFLOZIN PROPANEDIOL 10 MG PO TABS
10.0000 mg | ORAL_TABLET | Freq: Every day | ORAL | Status: DC
  Filled 2020-07-23: qty 1

## 2020-07-23 MED ORDER — SPIRONOLACTONE 12.5 MG HALF TABLET
12.5000 mg | ORAL_TABLET | Freq: Every day | ORAL | Status: DC
  Administered 2020-07-23 – 2020-07-24 (×2): 12.5 mg via ORAL
  Filled 2020-07-23 (×2): qty 1

## 2020-07-23 MED ORDER — FUROSEMIDE 40 MG PO TABS
40.0000 mg | ORAL_TABLET | Freq: Every day | ORAL | Status: DC
  Administered 2020-07-23 – 2020-07-24 (×2): 40 mg via ORAL
  Filled 2020-07-23 (×2): qty 1

## 2020-07-23 NOTE — Progress Notes (Signed)
TCTS:  Asked by Dr. Gardiner Rhyme if we had his name for consult yet. Dr. Orvan Seen listed as consulting physician. I talked with patient and he wasn't sure if surgeon had talked to him on Friday after his procedure. I will ask Dr. Orvan Seen to follow up tomorrow.

## 2020-07-23 NOTE — Progress Notes (Signed)
Progress Note  Patient Name: Danny Carpenter Date of Encounter: 07/23/2020  Primary Cardiologist:   No primary care provider on file.   Subjective   Net -1.8 L yesterday on IV Lasix 40 mg twice daily.  BP stable, 109/81 this morning.  Creatinine stable, mild trend up (1.1 > 1.2 > 1.3).  Weight down 5 pounds from admission.  He reports dyspnea has resolved, denies any chest pain.  Inpatient Medications    Scheduled Meds: . aspirin EC  81 mg Oral Daily  . carvedilol  6.25 mg Oral BID WC  . DULoxetine  30 mg Oral Daily  . enoxaparin (LOVENOX) injection  40 mg Subcutaneous Q24H  . furosemide  40 mg Oral Daily  . gabapentin  600 mg Oral TID  . insulin aspart  0-15 Units Subcutaneous TID WC  . insulin aspart  0-5 Units Subcutaneous QHS  . insulin glargine  40 Units Subcutaneous QHS  . pantoprazole  40 mg Oral QAC breakfast  . rosuvastatin  40 mg Oral Daily  . sacubitril-valsartan  1 tablet Oral BID  . sodium chloride flush  3 mL Intravenous Q12H  . sodium chloride flush  3 mL Intravenous Q12H  . spironolactone  12.5 mg Oral Daily   Continuous Infusions: . sodium chloride     PRN Meds: sodium chloride, acetaminophen, nitroGLYCERIN, ondansetron (ZOFRAN) IV, sodium chloride flush   Vital Signs    Vitals:   07/22/20 1941 07/22/20 1947 07/23/20 0602 07/23/20 1009  BP: 107/79  109/81 121/64  Pulse: 83  82 83  Resp: '20  18 18  '$ Temp:  97.9 F (36.6 C) 98.2 F (36.8 C) 97.9 F (36.6 C)  TempSrc: Oral  Oral Oral  SpO2: 99%  99% 98%  Weight:   107.6 kg   Height:        Intake/Output Summary (Last 24 hours) at 07/23/2020 1202 Last data filed at 07/23/2020 0610 Gross per 24 hour  Intake 250 ml  Output 2150 ml  Net -1900 ml   Filed Weights   07/24/2020 0550 07/22/20 0630 07/23/20 0602  Weight: 107.6 kg 107.2 kg 107.6 kg    Telemetry    NSR, occasional PVCs - Personally Reviewed  ECG    NA - Personally Reviewed  Physical Exam   GEN: No acute distress.   Neck: No   JVD Cardiac: RRR, no murmurs, rubs, or gallops.  Respiratory:     CTAB  GI: Soft, nontender, non-distended  MS:    No edema Neuro:  Nonfocal  Psych: Normal affect   Labs    Chemistry Recent Labs  Lab 07/16/2020 0204 07/28/2020 1248 07/17/2020 1255 07/22/20 0246 07/23/20 0133  NA 133*   < > 136 134* 130*  K 4.3   < > 4.0 3.8 4.1  CL 98  --   --  98 97*  CO2 27  --   --  27 25  GLUCOSE 175*  --   --  218* 351*  BUN 18  --   --  24* 30*  CREATININE 1.13  --   --  1.19 1.33*  CALCIUM 9.0  --   --  9.0 8.9  GFRNONAA >60  --   --  >60 58*  ANIONGAP 8  --   --  9 8   < > = values in this interval not displayed.     Hematology Recent Labs  Lab 07/11/2020 0204 07/29/2020 1248 07/27/2020 1255 07/22/20 0246 07/23/20 0133  WBC 6.9  --   --  6.5 5.1  RBC 4.67  --   --  4.40 4.24  HGB 12.8*   < > 11.9* 12.0* 11.6*  HCT 40.0   < > 35.0* 37.6* 36.4*  MCV 85.7  --   --  85.5 85.8  MCH 27.4  --   --  27.3 27.4  MCHC 32.0  --   --  31.9 31.9  RDW 14.6  --   --  14.6 14.7  PLT 258  --   --  238 239   < > = values in this interval not displayed.    Cardiac EnzymesNo results for input(s): TROPONINI in the last 168 hours. No results for input(s): TROPIPOC in the last 168 hours.   BNP Recent Labs  Lab 07/13/2020 1211  BNP 465.5*     DDimer No results for input(s): DDIMER in the last 168 hours.   Radiology    CT CHEST WO CONTRAST  Result Date: 07/26/2020 CLINICAL DATA:  Thoracic aortic Disease. Preoperative planning for coronary artery bypass. EXAM: CT CHEST WITHOUT CONTRAST TECHNIQUE: Multidetector CT imaging of the chest was performed following the standard protocol without IV contrast. COMPARISON:  03/22/2019 FINDINGS: Cardiovascular: Mild cardiac enlargement. No pericardial effusions. Calcification in the aorta, coronary arteries, mitral valve annulus. Normal caliber thoracic aorta. Mediastinum/Nodes: Prominent lymph nodes in the mediastinum without pathologic enlargement, mildly  increased since prior study. Nonspecific but likely reactive. Esophagus is decompressed. Lungs/Pleura: Small bilateral pleural effusions with basilar atelectasis, new since prior study. Patchy airspace disease and interstitial septal thickening throughout the lungs with a mostly central distribution. This is new since prior study. Changes most likely to represent multifocal pneumonia. No pneumothorax. Upper Abdomen: No acute process demonstrated in the visualized upper abdomen. Musculoskeletal: Degenerative changes in the spine. Multiple old rib fractures, some ununited. IMPRESSION: 1. Mild cardiac enlargement. 2. Small bilateral pleural effusions with basilar atelectasis, new since prior study. 3. Patchy airspace disease and interstitial septal thickening throughout the lungs with a mostly central distribution. New since prior study. Changes most likely to represent multifocal pneumonia. Edema less likely. 4. Prominent lymph nodes in the mediastinum without pathologic enlargement, mildly increased since prior study. Nonspecific but likely reactive. 5. Aortic and coronary artery atherosclerosis. 6. Multiple old rib fractures, some ununited. Aortic Atherosclerosis (ICD10-I70.0). Electronically Signed   By: Lucienne Capers M.D.   On: 07/23/2020 20:10   CARDIAC CATHETERIZATION  Result Date: 07/03/2020 Conclusions: 1. Severe three-vessel coronary artery disease, including moderate diffuse LMCA disease that is not hemodynamically significant by RFR,  long segment of proximal/mid LAD disease of up to 70% that is functionally significant (RFR = 0.61), chronically occluded mid LCx, and 90% proximal RCA stenosis with heavy calcification as well at 50% distal vessel disease. 2. Moderately elevated left heart and pulmonary artery pressures. 3. Mildly elevated right heart filling pressures. 4. Mildly reduced cardiac output/index. Recommendations: 1. Cardiac surgery consultation for CABG in the setting of three-vessel CAD,  low LVEF, and diabetes mellitus. 2. Initiate diuresis with escalation of goal-directed medical therapy for heart failure as tolerated. 3. Hold clopidogrel pending cardiac surgery consultation. 4. Aggressive secondary prevention of coronary artery disease. Nelva Bush, MD [facility]    Cardiac Studies   Echo:  Prelim.  Poor windows.  EF about 20% with the base moving but otherwise a more global hypokinesis or possibly more apical.  Final reading pending.   Images reviewed personally.   Patient Profile     70 y.o. male with a hx of HTN, HLD,  uncontrolled T2DM, CVA (remote lacunar infarct left cerebellum 10//18/20 MRI), AAA, Left foot ampuation, PAD s/p fem pop bypass, and heart failure  who is being seen for the evaluation of chest pain and shortness of breath   Assessment & Plan    NSTEMI: Presented with chest pain, mild troponin elevation to 172.  LHC/RHC on 4/22 showed severe three-vessel CAD: Moderate diffuse left main stenosis not significant by RFR, proximal to mid LAD 70% stenosis that is significant by RFR (0.61), chronically occluded mid LCx, 90% proximal RCA. -Cardiac surgery consulted for CABG evaluation -Continue aspirin 81 mg daily.  Was on Plavix as outpatient, has been discontinued, last dose 4/22 -Continue carvedilol 6.25 mg twice daily -Continue rosuvastatin 40 mg daily  Acute combined systolic and diastolic HF: Echocardiogram on 4/22 shows LVEF 25%, global hypokinesis, grade 3 diastolic dysfunction, mild RV dysfunction, no significant valvular disease.  RHC on 4/22 shows RA 9, RV 55/10, PA 55/30/38, PCWP 25, PA sat 60%, Fick CI 2.1, thermodilution CI 2.3 -Has improved with diuresis, appears euvolemic on exam and Cr trending up, will switch to PO lasix 40 mg daily -Strict I's and O's and daily weights -Continue Entresto 24-26 mg twice daily -Continue carvedilol 6.25 mg twice daily -Add spironolactone 12.5 mg daily  LDL 86 on 07/15/2020.  Rosuvastatin increased from 20  to 40 mg for goal LDL less than 70  DM:  AIC 11.6.  Appreciate diabetes coordinator recs, continue Lantus 40 units daily and NovoLog SSI.  Plan to add Farxiga vs Jardiance prior to discharge  Hypertension: On amlodipine 10 mg daily, lisinopril 40 mg daily at home. -Holding amlodipine and lisinopril -Started carvedilol and Entresto as above, has been normotensive.  ADD aldactone as above  Frequent PVCs: Improved. Maintain K greater than 4, mag greater than 2   For questions or updates, please contact [place] Please consult [email or web address] for contact info under Cardiology/STEMI.   Signed, Donato Heinz, MD  07/23/2020, 12:02 PM

## 2020-07-24 ENCOUNTER — Encounter (HOSPITAL_COMMUNITY): Payer: No Typology Code available for payment source

## 2020-07-24 ENCOUNTER — Other Ambulatory Visit (HOSPITAL_COMMUNITY): Payer: No Typology Code available for payment source

## 2020-07-24 ENCOUNTER — Inpatient Hospital Stay (HOSPITAL_COMMUNITY): Payer: No Typology Code available for payment source

## 2020-07-24 DIAGNOSIS — Z01810 Encounter for preprocedural cardiovascular examination: Secondary | ICD-10-CM | POA: Diagnosis not present

## 2020-07-24 DIAGNOSIS — I5023 Acute on chronic systolic (congestive) heart failure: Secondary | ICD-10-CM

## 2020-07-24 DIAGNOSIS — I25110 Atherosclerotic heart disease of native coronary artery with unstable angina pectoris: Secondary | ICD-10-CM

## 2020-07-24 DIAGNOSIS — I714 Abdominal aortic aneurysm, without rupture: Secondary | ICD-10-CM

## 2020-07-24 DIAGNOSIS — I214 Non-ST elevation (NSTEMI) myocardial infarction: Secondary | ICD-10-CM | POA: Diagnosis not present

## 2020-07-24 LAB — BLOOD GAS, ARTERIAL
Acid-base deficit: 0.5 mmol/L (ref 0.0–2.0)
Bicarbonate: 24 mmol/L (ref 20.0–28.0)
FIO2: 28
O2 Saturation: 93.3 %
Patient temperature: 37
pCO2 arterial: 41.4 mmHg (ref 32.0–48.0)
pH, Arterial: 7.38 (ref 7.350–7.450)
pO2, Arterial: 70.9 mmHg — ABNORMAL LOW (ref 83.0–108.0)

## 2020-07-24 LAB — CBC
HCT: 37 % — ABNORMAL LOW (ref 39.0–52.0)
Hemoglobin: 12.1 g/dL — ABNORMAL LOW (ref 13.0–17.0)
MCH: 28.3 pg (ref 26.0–34.0)
MCHC: 32.7 g/dL (ref 30.0–36.0)
MCV: 86.7 fL (ref 80.0–100.0)
Platelets: 249 10*3/uL (ref 150–400)
RBC: 4.27 MIL/uL (ref 4.22–5.81)
RDW: 14.7 % (ref 11.5–15.5)
WBC: 5.7 10*3/uL (ref 4.0–10.5)
nRBC: 0 % (ref 0.0–0.2)

## 2020-07-24 LAB — BASIC METABOLIC PANEL
Anion gap: 8 (ref 5–15)
BUN: 25 mg/dL — ABNORMAL HIGH (ref 8–23)
CO2: 25 mmol/L (ref 22–32)
Calcium: 9 mg/dL (ref 8.9–10.3)
Chloride: 98 mmol/L (ref 98–111)
Creatinine, Ser: 1.08 mg/dL (ref 0.61–1.24)
GFR, Estimated: >60 mL/min (ref >60)
Glucose, Bld: 244 mg/dL — ABNORMAL HIGH (ref 70–99)
Potassium: 4.3 mmol/L (ref 3.5–5.1)
Sodium: 131 mmol/L — ABNORMAL LOW (ref 135–145)

## 2020-07-24 LAB — URINALYSIS, ROUTINE W REFLEX MICROSCOPIC
Bacteria, UA: NONE SEEN
Bilirubin Urine: NEGATIVE
Glucose, UA: NEGATIVE mg/dL
Hgb urine dipstick: NEGATIVE
Ketones, ur: NEGATIVE mg/dL
Leukocytes,Ua: NEGATIVE
Nitrite: NEGATIVE
Protein, ur: 30 mg/dL — AB
Specific Gravity, Urine: 1.006 (ref 1.005–1.030)
pH: 6 (ref 5.0–8.0)

## 2020-07-24 LAB — GLUCOSE, CAPILLARY
Glucose-Capillary: 184 mg/dL — ABNORMAL HIGH (ref 70–99)
Glucose-Capillary: 143 mg/dL — ABNORMAL HIGH (ref 70–99)
Glucose-Capillary: 173 mg/dL — ABNORMAL HIGH (ref 70–99)
Glucose-Capillary: 292 mg/dL — ABNORMAL HIGH (ref 70–99)

## 2020-07-24 LAB — SURGICAL PCR SCREEN
MRSA, PCR: NEGATIVE
Staphylococcus aureus: NEGATIVE

## 2020-07-24 MED ORDER — TRAMADOL HCL 50 MG PO TABS
50.0000 mg | ORAL_TABLET | Freq: Four times a day (QID) | ORAL | Status: DC | PRN
  Administered 2020-07-24 (×2): 100 mg via ORAL
  Filled 2020-07-24 (×2): qty 2

## 2020-07-24 MED ORDER — PHENYLEPHRINE HCL-NACL 20-0.9 MG/250ML-% IV SOLN
30.0000 ug/min | INTRAVENOUS | Status: AC
  Administered 2020-07-25: 65 ug/min via INTRAVENOUS
  Administered 2020-07-25: 50 ug/min via INTRAVENOUS
  Filled 2020-07-24: qty 250

## 2020-07-24 MED ORDER — MILRINONE LACTATE IN DEXTROSE 20-5 MG/100ML-% IV SOLN
0.3000 ug/kg/min | INTRAVENOUS | Status: AC
  Administered 2020-07-25: 5480 ug via INTRAVENOUS
  Administered 2020-07-25: .25 ug/kg/min via INTRAVENOUS
  Filled 2020-07-24: qty 100

## 2020-07-24 MED ORDER — CHLORHEXIDINE GLUCONATE 0.12 % MT SOLN
15.0000 mL | Freq: Once | OROMUCOSAL | Status: AC
  Administered 2020-07-25: 15 mL via OROMUCOSAL
  Filled 2020-07-24: qty 15

## 2020-07-24 MED ORDER — SODIUM CHLORIDE 0.9 % IV SOLN
INTRAVENOUS | Status: DC
  Filled 2020-07-24: qty 30

## 2020-07-24 MED ORDER — SODIUM CHLORIDE 0.9 % IV SOLN
750.0000 mg | INTRAVENOUS | Status: AC
  Administered 2020-07-25: 750 mg via INTRAVENOUS
  Filled 2020-07-24: qty 750

## 2020-07-24 MED ORDER — NITROGLYCERIN IN D5W 200-5 MCG/ML-% IV SOLN
2.0000 ug/min | INTRAVENOUS | Status: DC
  Filled 2020-07-24: qty 250

## 2020-07-24 MED ORDER — EPINEPHRINE HCL 5 MG/250ML IV SOLN IN NS
0.0000 ug/min | INTRAVENOUS | Status: AC
  Administered 2020-07-25: 2 ug/min via INTRAVENOUS
  Filled 2020-07-24: qty 250

## 2020-07-24 MED ORDER — SODIUM CHLORIDE 0.9 % IV SOLN
1.5000 g | INTRAVENOUS | Status: AC
  Administered 2020-07-25: 1.5 g via INTRAVENOUS
  Filled 2020-07-24: qty 1.5

## 2020-07-24 MED ORDER — POTASSIUM CHLORIDE 2 MEQ/ML IV SOLN
80.0000 meq | INTRAVENOUS | Status: DC
  Filled 2020-07-24: qty 40

## 2020-07-24 MED ORDER — TRANEXAMIC ACID (OHS) BOLUS VIA INFUSION
15.0000 mg/kg | INTRAVENOUS | Status: AC
  Administered 2020-07-25: 1644 mg via INTRAVENOUS
  Filled 2020-07-24: qty 1644

## 2020-07-24 MED ORDER — TEMAZEPAM 15 MG PO CAPS
15.0000 mg | ORAL_CAPSULE | Freq: Once | ORAL | Status: AC | PRN
  Administered 2020-07-24: 15 mg via ORAL
  Filled 2020-07-24: qty 1

## 2020-07-24 MED ORDER — TRANEXAMIC ACID 1000 MG/10ML IV SOLN
1.5000 mg/kg/h | INTRAVENOUS | Status: AC
  Administered 2020-07-25: 1.5 mg/kg/h via INTRAVENOUS
  Filled 2020-07-24: qty 25

## 2020-07-24 MED ORDER — TRANEXAMIC ACID (OHS) PUMP PRIME SOLUTION
2.0000 mg/kg | INTRAVENOUS | Status: DC
  Filled 2020-07-24: qty 2.19

## 2020-07-24 MED ORDER — NOREPINEPHRINE 4 MG/250ML-% IV SOLN
0.0000 ug/min | INTRAVENOUS | Status: AC
  Administered 2020-07-25: 3 ug/min via INTRAVENOUS
  Filled 2020-07-24: qty 250

## 2020-07-24 MED ORDER — METHOCARBAMOL 500 MG PO TABS
500.0000 mg | ORAL_TABLET | Freq: Four times a day (QID) | ORAL | Status: DC | PRN
  Administered 2020-07-24 (×2): 500 mg via ORAL
  Filled 2020-07-24 (×2): qty 1

## 2020-07-24 MED ORDER — CHLORHEXIDINE GLUCONATE CLOTH 2 % EX PADS
6.0000 | MEDICATED_PAD | Freq: Once | CUTANEOUS | Status: AC
  Administered 2020-07-24: 6 via TOPICAL

## 2020-07-24 MED ORDER — INSULIN REGULAR(HUMAN) IN NACL 100-0.9 UT/100ML-% IV SOLN
INTRAVENOUS | Status: AC
  Administered 2020-07-25: 5 [IU]/h via INTRAVENOUS
  Filled 2020-07-24: qty 100

## 2020-07-24 MED ORDER — PLASMA-LYTE 148 IV SOLN
INTRAVENOUS | Status: DC
  Filled 2020-07-24: qty 2.5

## 2020-07-24 MED ORDER — VANCOMYCIN HCL 1500 MG/300ML IV SOLN
1500.0000 mg | INTRAVENOUS | Status: AC
  Administered 2020-07-25: 1500 mg via INTRAVENOUS
  Filled 2020-07-24: qty 300

## 2020-07-24 MED ORDER — METOPROLOL TARTRATE 12.5 MG HALF TABLET
12.5000 mg | ORAL_TABLET | Freq: Once | ORAL | Status: AC
  Administered 2020-07-25: 12.5 mg via ORAL
  Filled 2020-07-24: qty 1

## 2020-07-24 MED ORDER — DEXMEDETOMIDINE HCL IN NACL 400 MCG/100ML IV SOLN
0.1000 ug/kg/h | INTRAVENOUS | Status: AC
  Administered 2020-07-25: .4 ug/kg/h via INTRAVENOUS
  Filled 2020-07-24: qty 100

## 2020-07-24 MED ORDER — MAGNESIUM SULFATE 50 % IJ SOLN
40.0000 meq | INTRAMUSCULAR | Status: DC
  Filled 2020-07-24: qty 9.85

## 2020-07-24 MED ORDER — BISACODYL 5 MG PO TBEC: 5.0000 mg | DELAYED_RELEASE_TABLET | Freq: Once | ORAL | Status: DC

## 2020-07-24 MED FILL — Heparin Sod (Porcine)-NaCl IV Soln 1000 Unit/500ML-0.9%: INTRAVENOUS | Qty: 500 | Status: AC

## 2020-07-24 NOTE — Consult Note (Signed)
[facility] Preliminary Consult Note  Pt seen and examined; chart and films reviewed. Remainder of CABG work-up being completed today. Tentatively plan CABG tomorrow. Full consult note to follow. Danny Carpenter Z. Orvan Seen, [place]

## 2020-07-24 NOTE — Plan of Care (Signed)
  Problem: Activity: Goal: Risk for activity intolerance will decrease Outcome: Progressing   Problem: Urinary Elimination: Goal: Ability to achieve and maintain adequate renal perfusion and functioning will improve Outcome: Progressing

## 2020-07-24 NOTE — Consult Note (Signed)
Danny Carpenter       Danny Carpenter,Danny Carpenter [id number]             [phone]        Binyamin Chausse [facility] Medical Record [id number] Date of Birth: [birth year]  Referring: No ref. provider found Primary Care: Serita Grammes, MD Primary Cardiologist:No primary care provider on file.  Chief Complaint:    Chief Complaint  Patient presents with  . Shortness of Breath  . Chest Pain    History of Present Illness:     70 year old man with a history of hyper lipidemia hypertension diabetes history of stroke and peripheral vascular disease presented with chest pain earlier this month.  This occurred at rest.  Chest pain radiated to the neck.  He was also experiencing shortness of breath at that time.  Later, he asked noted orthopnea.  After seeking attention, stress test was performed which demonstrated reversible ischemia in the apical segment with global hypokinesis; EF 25% He underwent LHC showing severe multivessel CAD and is referred for surgery. He has been stable over the weekend but still states he doesn't feel well.    Current Activity/ Functional Status: Patient will be independent with mobility/ambulation, transfers, ADL's, IADL's.   Zubrod Score: At the time of surgery this patient's most appropriate activity status/level should be described as: '[]'$     0    Normal activity, no symptoms '[]'$     1    Restricted in physical strenuous activity but ambulatory, able to do out light work '[]'$     2    Ambulatory and capable of self care, unable to do work activities, up and about                 more than 50%  Of the time                            '[]'$     3    Only limited self care, in bed greater than 50% of waking hours '[]'$     4    Completely disabled, no self care, confined to bed or chair '[]'$     5    Moribund  Past Medical History:  Diagnosis Date  . AAA (abdominal aortic aneurysm) without rupture ([place]) 06/11/2019   infrarenal   Last Assessment & Plan:  Uptodate.  Stable  at 4.1cm Repeat visit to vascular surg in 12/2015.  Marland Kitchen Anxiety   . Arthritis   . Chronic fatigue 01/09/2015   Last Assessment & Plan:  Chronic fatigue. Suspect multifactorial including OSA, hyperglycemia (patient not taking insulin as prescribed), pain medications. No other constitutional sx.  Will plan for repeat lab work.   - follow up in 1 mth after sleep study results.  . Chronic pain 04/05/2013   Last Assessment & Plan:   Known Degenerative disc disease and bilateral peripheral neuropathic leg pain   continue cymbalta '60mg'$   reports has trialed injections in past as well as  '5mg'$  hydrocodone with no success. Also tried tramadol in the past with no benefit.  Prescribed Norco 7.5/'325mg'$  Hydrocodone/ Acetaminophen 1-1.5 tabs every 8 hours prn #150. 1 months through 04/17/14  UTox 06/14/14 app  . COPD (chronic obstructive pulmonary disease) ([place])   . CVA (cerebral vascular accident) ([place])   . DDD (degenerative disc disease) 10/08/2004  . Depression   . Diabetic foot infection ([place]) 01/17/2019  . Fracture of rib  Left  . GERD (gastroesophageal reflux disease)   . HTN (hypertension)   . Hypercholesteremia   . Hyperlipidemia 06/11/2019   On Lipitor '80mg'$  per day  Last Assessment & Plan:  PLAN: HLD continue Lipitor '80mg'$  daily  . Osteomyelitis ([place]) 08/05/2019   left foot  . PAD (peripheral artery disease) ([place]) 01/27/2019  . Peripheral vascular disease ([place])   . PONV (postoperative nausea and vomiting)   . PVD (peripheral vascular disease) ([place])   . Seizure [facility])    one time in Oct. 2020  . Senile nuclear sclerosis 12/06/2011  . Sleep apnea    no cpap  . Subacute osteomyelitis of left foot ([place])   . Type 2 diabetes mellitus with other specified complication [facility])     Past Surgical History:  Procedure Laterality Date  . ABDOMINAL AORTOGRAM W/LOWER EXTREMITY N/A 01/19/2019   Procedure: ABDOMINAL AORTOGRAM W/LOWER EXTREMITY;  Surgeon: Serafina Mitchell, MD;  Location: [place] CV [facility];   Service: Cardiovascular;  Laterality: N/A;  . ABDOMINAL AORTOGRAM W/LOWER EXTREMITY Left 06/17/2019   Procedure: ABDOMINAL AORTOGRAM W/LOWER EXTREMITY;  Surgeon: Marty Heck, MD;  Location: [place] [facility];  Service: Cardiovascular;  Laterality: Left;  . AMPUTATION Left 02/22/2019   Procedure: LEFT FOOT 5TH RAY AMPUTATION;  Surgeon: Newt Minion, MD;  Location: [place];  Service: Orthopedics;  Laterality: Left;  . APPENDECTOMY    . ENDARTERECTOMY FEMORAL Left 01/27/2019   Procedure: ENDARTERECTOMY LEFT FEMORAL ARTERY AND PROFUNDA  ARTERY;  Surgeon: Serafina Mitchell, MD;  Location: [place];  Service: Vascular;  Laterality: Left;  . FEMORAL-POPLITEAL BYPASS GRAFT Left 01/27/2019   Procedure: BYPASS GRAFT FEMORAL-POPLITEAL ARTERY;  Surgeon: Serafina Mitchell, MD;  Location: [place];  Service: Vascular;  Laterality: Left;  . FINGER AMPUTATION     Left second and third fingers, work accident  . INTRAVASCULAR PRESSURE WIRE/FFR STUDY N/A 07/22/2020   Procedure: INTRAVASCULAR PRESSURE WIRE/FFR STUDY;  Surgeon: Nelva Bush, MD;  Location: [place] [facility];  Service: Cardiovascular;  Laterality: N/A;  . KNEE SURGERY Left   . LOWER EXTREMITY ANGIOGRAM  05/2008   Left SFA atherectomy with diamondback 2.25 utilizing distal protection device NAV6 placed in the left popliteal artery. Angioplasty of the left SFA with ATV balloon 6 x 40   . PATCH ANGIOPLASTY Left 01/27/2019   Procedure: Patch Angioplasty using Vein;  Surgeon: Serafina Mitchell, MD;  Location: [place];  Service: Vascular;  Laterality: Left;  . PERIPHERAL VASCULAR BALLOON ANGIOPLASTY Left 06/17/2019   Procedure: PERIPHERAL VASCULAR BALLOON ANGIOPLASTY;  Surgeon: Marty Heck, MD;  Location: [place] [facility];  Service: Cardiovascular;  Laterality: Left;  left proximal and mid bypass  . RIGHT/LEFT HEART CATH AND CORONARY ANGIOGRAPHY N/A 07/13/2020   Procedure: RIGHT/LEFT HEART CATH AND CORONARY ANGIOGRAPHY;  Surgeon: Nelva Bush, MD;  Location: [place] [facility];  Service: Cardiovascular;  Laterality: N/A;  . SHOULDER SURGERY Right   . TRANSMETATARSAL AMPUTATION Left 08/06/2019   Procedure: TRANSMETATARSAL AMPUTATION;  Surgeon: Landis Martins, DPM;  Location: [place];  Service: Podiatry;  Laterality: Left;  Available after 5pm   . VEIN HARVEST Left 01/27/2019   Procedure: Left Saphenous Vein Harvest;  Surgeon: Serafina Mitchell, MD;  Location: [facility];  Service: Vascular;  Laterality: Left;    Social History   Tobacco Use  Smoking Status Former Smoker  . Packs/day: 1.00  . Years: 40.00  . Pack years: 40.00  . Types: Cigarettes  . Quit date: 04/01/2009  .  Years since quitting: 11.3  Smokeless Tobacco Never Used    Social History   Substance and Sexual Activity  Alcohol Use Not Currently     No Known Allergies  Current Facility-Administered Medications  Medication Dose Route Frequency Provider Last Rate Last Admin  . 0.9 %  sodium chloride infusion  250 mL Intravenous PRN End, Harrell Gave, MD      . acetaminophen (TYLENOL) tablet 650 mg  650 mg Oral Q4H PRN End, Harrell Gave, MD   650 mg at 07/24/20 0259  . aspirin EC tablet 81 mg  81 mg Oral Daily End, Christopher, MD   81 mg at 07/24/20 0843  . carvedilol (COREG) tablet 6.25 mg  6.25 mg Oral BID WC End, Christopher, MD   6.25 mg at 07/24/20 0843  . DULoxetine (CYMBALTA) DR capsule 30 mg  30 mg Oral Daily End, Christopher, MD   30 mg at 07/24/20 0843  . enoxaparin (LOVENOX) injection 40 mg  40 mg Subcutaneous Q24H End, Christopher, MD   40 mg at 07/24/20 0842  . furosemide (LASIX) tablet 40 mg  40 mg Oral Daily Donato Heinz, MD   40 mg at 07/24/20 0843  . gabapentin (NEURONTIN) tablet 600 mg  600 mg Oral TID End, Christopher, MD   600 mg at 07/24/20 0843  . insulin aspart (novoLOG) injection 0-15 Units  0-15 Units Subcutaneous TID WC End, Harrell Gave, MD   3 Units at 07/24/20 0844  . insulin aspart (novoLOG) injection 0-5 Units  0-5  Units Subcutaneous QHS End, Christopher, MD   3 Units at 07/23/20 2142  . insulin glargine (LANTUS) injection 40 Units  40 Units Subcutaneous QHS End, Christopher, MD   40 Units at 07/23/20 2143  . methocarbamol (ROBAXIN) tablet 500 mg  500 mg Oral Q6H PRN Barrett, Rhonda G, PA-C   500 mg at 07/24/20 1000  . nitroGLYCERIN (NITROSTAT) SL tablet 0.4 mg  0.4 mg Sublingual Q5 Min x 3 PRN End, Harrell Gave, MD      . ondansetron (ZOFRAN) injection 4 mg  4 mg Intravenous Q6H PRN End, Christopher, MD      . pantoprazole (PROTONIX) EC tablet 40 mg  40 mg Oral QAC breakfast End, Christopher, MD   40 mg at 07/24/20 0842  . rosuvastatin (CRESTOR) tablet 40 mg  40 mg Oral Daily End, Christopher, MD   40 mg at 07/24/20 0842  . sacubitril-valsartan (ENTRESTO) 24-26 mg per tablet  1 tablet Oral BID End, Harrell Gave, MD   1 tablet at 07/24/20 0843  . sodium chloride flush (NS) 0.9 % injection 3 mL  3 mL Intravenous Q12H End, Christopher, MD   3 mL at 07/24/20 1003  . sodium chloride flush (NS) 0.9 % injection 3 mL  3 mL Intravenous Q12H End, Christopher, MD   3 mL at 07/24/20 1002  . sodium chloride flush (NS) 0.9 % injection 3 mL  3 mL Intravenous PRN End, Christopher, MD      . spironolactone (ALDACTONE) tablet 12.5 mg  12.5 mg Oral Daily Donato Heinz, MD   12.5 mg at 07/24/20 0843  . traMADol (ULTRAM) tablet 50-100 mg  50-100 mg Oral Q6H PRN Barrett, Rhonda G, PA-C   100 mg at 07/24/20 1000    Medications Prior to Admission  Medication Sig Dispense Refill Last Dose  . amLODipine (NORVASC) 10 MG tablet Take 1 tablet (10 mg total) by mouth daily. 30 tablet 0 07/03/2020 at Unknown time  . aspirin EC 81 MG tablet Take 81  mg by mouth daily. Swallow whole.   07/16/2020 at Unknown time  . clopidogrel (PLAVIX) 75 MG tablet Take 75 mg by mouth daily.   07/08/2020 at Unknown time  . DULoxetine (CYMBALTA) 30 MG capsule Take 30 mg by mouth daily.   07/02/2020 at Unknown time  . gabapentin (NEURONTIN) 600 MG tablet  Take 600 mg by mouth 3 (three) times daily.   07/19/2020 at Unknown time  . Insulin Degludec (TRESIBA) 100 UNIT/ML SOLN Inject 62 Units into the skin in the morning. Every morning to control blood sugar   07/12/2020 at Unknown time  . levocetirizine (XYZAL) 5 MG tablet Take 5 mg by mouth at bedtime.   07/19/2020 at Unknown time  . lisinopril (ZESTRIL) 40 MG tablet Take 40 mg by mouth daily.   07/24/2020 at Unknown time  . metFORMIN (GLUCOPHAGE) 1000 MG tablet Take 1,000 mg by mouth 2 (two) times daily with a meal.   07/04/2020 at Unknown time  . nitroGLYCERIN (NITROSTAT) 0.4 MG SL tablet Place 0.4 mg under the tongue every 5 (five) minutes as needed for chest pain.   unknown at unknown  . pantoprazole (PROTONIX) 40 MG tablet Take 40 mg by mouth daily before breakfast.   07/11/2020 at Unknown time  . rosuvastatin (CRESTOR) 20 MG tablet Take 20 mg by mouth daily.   07/09/2020 at Unknown time  . Vitamin D, Ergocalciferol, (DRISDOL) 1.25 MG (50000 UNIT) CAPS capsule Take 50,000 Units by mouth 2 (two) times a week.   Past Week at Unknown time    Family History  Problem Relation Age of Onset  . Cancer Mother   . Cancer Father      Review of Systems:   ROS Pertinent items noted in HPI and remainder of comprehensive ROS otherwise negative.     Cardiac Review of Systems: Y or  [    ]= no  Chest Pain [ y   ]  Resting SOB [n   ] Exertional SOB  [ y ]  Vertell Limber Blue.Reese  ]   Pedal Edema [   ]    Palpitations [  ] Syncope  [  ]   Presyncope [   ]  General Review of Systems: [Y] = yes [  ]=no Constitional: recent weight change [n  ]; anorexia [ y ]; fatigue Blue.Reese  ]; nausea [  ]; night sweats [  ]; fever [  ]; or chills [  ]                                                               Dental: Last Dentist visit:   Eye : blurred vision [  ]; diplopia [   ]; vision changes [  ];  Amaurosis fugax[  ]; Resp: cough [ n ];  wheezing[  ];  hemoptysis[ n ]; shortness of breath[ y ]; paroxysmal nocturnal dyspnea[  ];  dyspnea on exertion[  ]; or orthopnea[  ];  GI:  gallstones[  ], vomiting[ n ];  dysphagia[ n ]; melena[  ];  hematochezia [  ]; heartburn[n  ];   Hx of  Colonoscopy[  ]; GU: kidney stones [ n ]; hematuria[  ];   dysuria [  ];  nocturia[  ];  history of  obstruction [  ]; urinary frequency [  ]             Skin: rash, swelling[  ];, hair loss[n  ];  peripheral edema[ n ];  or itching[  ]; Musculosketetal: myalgias[  ];  joint swelling[  ];  joint erythema[  ];  joint pain[  ];  back pain[y  ];  Heme/Lymph: bruising[  ];  bleeding[  ];  anemia[n  ];  Neuro: Florentina.Bell  ];  headaches[ y ];  stroke[  ];  vertigo[  ];  seizures[  ];   paresthesias[  ];  difficulty walking[  ];  Psych:depression[  ]; anxiety[  ];  Endocrine: diabetes[ y ];  thyroid dysfunction[  ];            Physical Exam: BP 122/74 (BP Location: Left Arm)   Pulse 84   Temp 98.3 F (36.8 C) (Oral)   Resp 16   Ht '6\' 1"'$  (1.854 m)   Wt 109.6 kg   SpO2 96%   BMI 31.89 kg/m    General appearance: alert and cooperative Head: atraumatic Neck: no adenopathy, no carotid bruit, no JVD, supple, symmetrical, trachea midline and thyroid not enlarged, symmetric, no tenderness/mass/nodules Resp: diminished breath sounds bibasilar Cardio: regular rate and rhythm, S1, S2 normal, no murmur, click, rub or gallop GI: soft, non-tender; bowel sounds normal; no masses,  no organomegaly Extremities: healed L transmetatarsal amputation; mild edema Neurologic: Grossly normal  Diagnostic Studies & Laboratory data:     Recent Radiology Findings:   VAS US DOPPLER PRE CABG  Result Date: 07/24/2020 PREOPERATIVE VASCULAR EVALUATION Patient Name:  TALON Sukup  Date of Exam:   07/24/2020 Medical Rec #: [id number]      Accession #:    [id number] Date of Birth: [birth year]     Patient Gender: M Patient Age:   69Y Exam Location[facility]  [facility] Procedure:      VAS US DOPPLER PRE CABG Referring Phys: [id number] Desmen Schoffstall Z Hank Walling  --------------------------------------------------------------------------------  Indications:            Pre-CABG. Risk Factors:           Hypertension, hyperlipidemia, Diabetes, past history of                         smoking, PAD. Other Factors:          Left transmetatarsal amputation 08/06/2019. Vascular Interventions: 01/27/2019- left femoral artery and profunda femoral                         artery endarterectomy, left femoral-popliteal artery                         bypass graft. Comparison Study:       ABI 05/13/2019- right ABI/TBI= 0.73/0.69, left ABI/TBI=                         0.89/0.38 Performing Technologist: Maudry Mayhew MHA, RVT, RDCS, RDMS  Examination Guidelines: A complete evaluation includes B-mode imaging, spectral Doppler, color Doppler, and power Doppler as needed of all accessible portions of each vessel. Bilateral testing is considered an integral part of a complete examination. Limited examinations for reoccurring indications may be performed as noted.  Right Carotid Findings: +----------+--------+--------+--------+------------------------------+--------+           PSV cm/sEDV cm/sStenosisDescribe  Comments +----------+--------+--------+--------+------------------------------+--------+ CCA Prox  53      6               smooth and heterogenous                +----------+--------+--------+--------+------------------------------+--------+ CCA Distal42      9               focal, smooth and heterogenous         +----------+--------+--------+--------+------------------------------+--------+ ICA Prox  67      29      1-39%                                          +----------+--------+--------+--------+------------------------------+--------+ ICA Distal84      33                                            tortuous +----------+--------+--------+--------+------------------------------+--------+ ECA       38                      smooth  and heterogenous                +----------+--------+--------+--------+------------------------------+--------+ Portions of this table do not appear on this page. +----------+--------+-------+----------------+------------+           PSV cm/sEDV cmsDescribe        Arm Pressure +----------+--------+-------+----------------+------------+ [id number]             Multiphasic, WNL             +----------+--------+-------+----------------+------------+ +---------+--------+--+--------+--+---------+ VertebralPSV cm/s51EDV cm/s11Antegrade +---------+--------+--+--------+--+---------+ Left Carotid Findings: +----------+--------+--------+--------+------------------------------+--------+           PSV cm/sEDV cm/sStenosisDescribe                      Comments +----------+--------+--------+--------+------------------------------+--------+ CCA Prox  79      16                                                     +----------+--------+--------+--------+------------------------------+--------+ CCA Distal69      16              focal, smooth and heterogenous         +----------+--------+--------+--------+------------------------------+--------+ ICA Prox  146     44      40-59%  heterogenous and calcific              +----------+--------+--------+--------+------------------------------+--------+ ICA Distal100     28                                                     +----------+--------+--------+--------+------------------------------+--------+ ECA       53      5               heterogenous and calcific              +----------+--------+--------+--------+------------------------------+--------+ +----------+--------+--------+----------------+------------+ SubclavianPSV cm/sEDV cm/sDescribe        Arm Pressure +----------+--------+--------+----------------+------------+  153             Multiphasic, WNL              +----------+--------+--------+----------------+------------+ +---------+--------+--+--------+--+---------+ VertebralPSV cm/s39EDV cm/s11Antegrade +---------+--------+--+--------+--+---------+  ABI Findings: +---------+------------------+-----+----------+--------+ Right    Rt Pressure (mmHg)IndexWaveform  Comment  +---------+------------------+-----+----------+--------+ Brachial 126                    triphasic          +---------+------------------+-----+----------+--------+ PTA      106               0.84 monophasic         +---------+------------------+-----+----------+--------+ DP       98                0.78 monophasic         +---------+------------------+-----+----------+--------+ Richmond Campbell               1.09                    +---------+------------------+-----+----------+--------+ +---------+------------------+-----+----------+--------------------------------+ Left     Lt Pressure (mmHg)IndexWaveform  Comment                          +---------+------------------+-----+----------+--------------------------------+ Brachial 120                    triphasic                                  +---------+------------------+-----+----------+--------------------------------+ PTA      128               1.02 monophasic                                 +---------+------------------+-----+----------+--------------------------------+ DP       112               0.89 monophasic                                 +---------+------------------+-----+----------+--------------------------------+ Great Toe                                 Unable to obtain pressure due to                                           amputation                       +---------+------------------+-----+----------+--------------------------------+ +-------+---------------+----------------+ ABI/TBIToday's ABI/TBIPrevious ABI/TBI +-------+---------------+----------------+ Right   0.84/1.09      0.73/0.69        +-------+---------------+----------------+ Left   1.02/-         0.89/0.38        +-------+---------------+----------------+  Right Doppler Findings: +-----------+--------+-----+---------+-----------------------------------------+ Site       PressureIndexDoppler  Comments                                  +-----------+--------+-----+---------+-----------------------------------------+ Brachial   126  triphasic                                          +-----------+--------+-----+---------+-----------------------------------------+ Radial                  triphasic                                          +-----------+--------+-----+---------+-----------------------------------------+ Ulnar                   triphasic                                          +-----------+--------+-----+---------+-----------------------------------------+ Palmar Arch                      Signal obliterates with radial                                             compression, is unaffected with ulnar                                      compression                               +-----------+--------+-----+---------+-----------------------------------------+  Left Doppler Findings: +-----------+--------+-----+---------+-----------------------------------------+ Site       PressureIndexDoppler  Comments                                  +-----------+--------+-----+---------+-----------------------------------------+ Brachial   120          triphasic                                          +-----------+--------+-----+---------+-----------------------------------------+ Radial                  triphasic                                          +-----------+--------+-----+---------+-----------------------------------------+ Ulnar                   triphasic                                           +-----------+--------+-----+---------+-----------------------------------------+ Palmar Arch                      Signal is unaffected with radial  compression, decreases 50% with ulnar                                      compression.                              +-----------+--------+-----+---------+-----------------------------------------+  Summary: Right Carotid: Velocities in the right ICA are consistent with a 1-39% stenosis. Left Carotid: Velocities in the left ICA are consistent with a 40-59% stenosis. Vertebrals:  Bilateral vertebral arteries demonstrate antegrade flow. Subclavians: Normal flow hemodynamics were seen in bilateral subclavian              arteries. Right ABI: Resting right ankle-brachial index indicates mild right lower extremity arterial disease. The right toe-brachial index is normal. Left ABI: Resting left ankle-brachial index is within normal range. No evidence of significant left lower extremity arterial disease. Right Upper Extremity: No significant arterial obstruction detected in the right upper extremity. Left Upper Extremity: No significant arterial obstruction detected in the left upper extremity.     Preliminary      I have independently reviewed the above radiologic studies and discussed with the patient   Recent Lab Findings: Lab Results  Component Value Date   WBC 5.7 07/24/2020   HGB 12.1 (L) 07/24/2020   HCT 37.0 (L) 07/24/2020   PLT 249 07/24/2020   GLUCOSE 244 (H) 07/24/2020   CHOL 159 07/04/2020   TRIG 192 (H) 06/30/2020   HDL 35 (L) 07/07/2020   LDLCALC 86 07/06/2020   ALT 33 08/08/2019   AST 15 08/08/2019   NA 131 (L) 07/24/2020   K 4.3 07/24/2020   CL 98 07/24/2020   CREATININE 1.08 07/24/2020   BUN 25 (H) 07/24/2020   CO2 25 07/24/2020   INR 1.0 07/17/2020   HGBA1C 11.6 (H) 07/11/2020      Assessment / Plan:       70 yo man with multiple cardiac risk factors presents with CHF  and multivessel CAD. He is relatively high risk for CABG, primarily from a cardiac and vascular perspective. However, his best long-term outcome is with CABG. He understands and wishes to proceed.    I  spent 40 minutes counseling the patient face to face.   Tracker Mance Z. Orvan Seen, MD [phone] 07/24/2020 2:39 PM

## 2020-07-24 NOTE — Progress Notes (Signed)
Heart Failure Navigator Progress Note  Assessed for Heart & Vascular TOC clinic readiness.  Unfortunately at this time the patient does not meet criteria due to CVTS consult for CABG work-up.   Navigator available for reassessment of patient.   Pricilla Holm, RN, BSN Heart Failure Nurse Navigator [phone]

## 2020-07-24 NOTE — Progress Notes (Signed)
CARDIAC REHAB PHASE I   Went to complete preop education with pt. Pt out of room for testing. Materials left at bedside. Will return as pts schedule allows.  Rufina Falco, RN BSN 07/24/2020 9:58 AM

## 2020-07-24 NOTE — Progress Notes (Signed)
CARDIAC REHAB PHASE I   Preop education completed with pt. Pt given IS and able to demonstrate ~1250. Pt educated on importance of IS use, walks, and sternal precautions. Pt given in-the-tube sheet along with [facility] care guide and Cardiac Surgery booklet. Pt states ambulation around room without difficulty. Has help after discharge. Will continue to follow throughout his hospital stay.  HC:[id number] Rufina Falco, RN BSN 07/24/2020 1:08 PM

## 2020-07-24 NOTE — Progress Notes (Signed)
Pre-CABG Dopplers completed. Refer to "CV Proc" under chart review to view preliminary results.  07/24/2020 2:19 PM Kelby Aline., MHA, RVT, RDCS, RDMS

## 2020-07-24 NOTE — Anesthesia Preprocedure Evaluation (Addendum)
Anesthesia Evaluation  Patient identified by MRN, date of birth, ID band Patient awake    Reviewed: Allergy & Precautions, NPO status , Patient's Chart, lab work & pertinent test results  Airway Mallampati: III  TM Distance: >3 FB Neck ROM: Full    Dental  (+) Dental Advisory Given   Pulmonary sleep apnea , COPD, former smoker,    breath sounds clear to auscultation       Cardiovascular hypertension, Pt. on medications + CAD, + Past MI, + Peripheral Vascular Disease and +CHF   Rhythm:Regular Rate:Normal     Neuro/Psych Seizures -,  CVA    GI/Hepatic Neg liver ROS, GERD  ,  Endo/Other  diabetes, Type 2, Insulin Dependent  Renal/GU Renal disease     Musculoskeletal  (+) Arthritis ,   Abdominal   Peds  Hematology negative hematology ROS (+) anemia ,   Anesthesia Other Findings   Reproductive/Obstetrics                            Lab Results  Component Value Date   WBC 5.7 07/24/2020   HGB 12.1 (L) 07/24/2020   HCT 37.0 (L) 07/24/2020   MCV 86.7 07/24/2020   PLT 249 07/24/2020   Lab Results  Component Value Date   CREATININE 1.08 07/24/2020   BUN 25 (H) 07/24/2020   NA 131 (L) 07/24/2020   K 4.3 07/24/2020   CL 98 07/24/2020   CO2 25 07/24/2020    Anesthesia Physical Anesthesia Plan  ASA: IV  Anesthesia Plan: General   Post-op Pain Management:    Induction: Intravenous  PONV Risk Score and Plan: 2 and Dexamethasone, Ondansetron and Treatment may vary due to age or medical condition  Airway Management Planned: Oral ETT  Additional Equipment: Arterial line, CVP, PA Cath, TEE and Ultrasound Guidance Line Placement  Intra-op Plan:   Post-operative Plan: Post-operative intubation/ventilation  Informed Consent: I have reviewed the patients History and Physical, chart, labs and discussed the procedure including the risks, benefits and alternatives for the proposed  anesthesia with the patient or authorized representative who has indicated his/her understanding and acceptance.     Dental advisory given  Plan Discussed with: CRNA  Anesthesia Plan Comments:        Anesthesia Quick Evaluation

## 2020-07-24 NOTE — Progress Notes (Signed)
Progress Note  Patient Name: Danny Carpenter Date of Encounter: 07/24/2020  Primary Cardiologist:   No primary care provider on file.   Subjective   Dyspnea improved. Dr Cyndia Bent saw yesterday ? Dr Hoover Brunette to see today for CABG evaluation   Inpatient Medications    Scheduled Meds: . aspirin EC  81 mg Oral Daily  . carvedilol  6.25 mg Oral BID WC  . DULoxetine  30 mg Oral Daily  . enoxaparin (LOVENOX) injection  40 mg Subcutaneous Q24H  . furosemide  40 mg Oral Daily  . gabapentin  600 mg Oral TID  . insulin aspart  0-15 Units Subcutaneous TID WC  . insulin aspart  0-5 Units Subcutaneous QHS  . insulin glargine  40 Units Subcutaneous QHS  . pantoprazole  40 mg Oral QAC breakfast  . rosuvastatin  40 mg Oral Daily  . sacubitril-valsartan  1 tablet Oral BID  . sodium chloride flush  3 mL Intravenous Q12H  . sodium chloride flush  3 mL Intravenous Q12H  . spironolactone  12.5 mg Oral Daily   Continuous Infusions: . sodium chloride     PRN Meds: sodium chloride, acetaminophen, methocarbamol, nitroGLYCERIN, ondansetron (ZOFRAN) IV, sodium chloride flush, traMADol   Vital Signs    Vitals:   07/23/20 1636 07/23/20 2035 07/24/20 0304 07/24/20 0307  BP: 135/73 131/66    Pulse: 97 74  78  Resp: '16 16  16  '$ Temp: 98.1 F (36.7 C) 98.4 F (36.9 C)  97.9 F (36.6 C)  TempSrc: Oral Oral  Oral  SpO2: 99% 98%  97%  Weight:   109.6 kg   Height:        Intake/Output Summary (Last 24 hours) at 07/24/2020 0839 Last data filed at 07/24/2020 0302 Gross per 24 hour  Intake 760 ml  Output 600 ml  Net 160 ml   Filed Weights   07/22/20 0630 07/23/20 0602 07/24/20 0304  Weight: 107.2 kg 107.6 kg 109.6 kg    Telemetry    NSR, occasional PVCs - Personally Reviewed 07/24/2020   ECG    07/22/20 SR rate 73 PVC nonspecific ST changes   Physical Exam   Affect appropriate Healthy:  appears stated age 70: normal Neck supple with no adenopathy JVP normal no bruits no  thyromegaly Lungs clear with no wheezing and good diaphragmatic motion Heart:  S1/S2 no murmur, no rub, gallop or click PMI normal Abdomen: benighn, BS positve, no tenderness, no AAA no bruit.  No HSM or HJR Distal pulses intact with no bruits No edema Neuro non-focal Skin warm and dry Left foot amputation    Labs    Chemistry Recent Labs  Lab 07/22/20 0246 07/23/20 0133 07/24/20 0322  NA 134* 130* 131*  K 3.8 4.1 4.3  CL 98 97* 98  CO2 '27 25 25  '$ GLUCOSE 218* 351* 244*  BUN 24* 30* 25*  CREATININE 1.19 1.33* 1.08  CALCIUM 9.0 8.9 9.0  GFRNONAA >60 58* >60  ANIONGAP '9 8 8     '$ Hematology Recent Labs  Lab 07/22/20 0246 07/23/20 0133 07/24/20 0322  WBC 6.5 5.1 5.7  RBC 4.40 4.24 4.27  HGB 12.0* 11.6* 12.1*  HCT 37.6* 36.4* 37.0*  MCV 85.5 85.8 86.7  MCH 27.3 27.4 28.3  MCHC 31.9 31.9 32.7  RDW 14.6 14.7 14.7  PLT 238 239 249    Cardiac EnzymesNo results for input(s): TROPONINI in the last 168 hours. No results for input(s): TROPIPOC in the last 168 hours.  BNP Recent Labs  Lab 07/24/2020 1211  BNP 465.5*     DDimer No results for input(s): DDIMER in the last 168 hours.   Radiology    No results found.  Cardiac Studies   Echo:  Prelim.  Poor windows.  EF 25% RV mildly reduced mild MR no AS   Patient Profile     70 y.o. male with a hx of HTN, HLD, uncontrolled T2DM, CVA (remote lacunar infarct left cerebellum 10//18/20 MRI), AAA, Left foot ampuation, PAD s/p fem pop bypass, and heart failure  who is being seen for the evaluation of chest pain and shortness of breath   Assessment & Plan    NSTEMI: peak troponin only 172 cath 4/22 with severe 3 vessel disease and LM Last dose plavix 4/22 awaiting CABG evaluation Severe ischemic DCM continue current meds Not sure MRI / Viability would add anything Given age, DM and LM disease with good distal targets for CABG this would seem to be his best option  CHF EF 25% PCWP 25 mmHg at cath good diuresis with  resolution of dyspnea  Continue entresto, coreg lasix and aldactone   HLD:  LDL  86 on 07/12/2020.  crestor 40 mg daily   DM:  AIC 11.6.  Appreciate diabetes coordinator recs, continue Lantus 40 units daily and NovoLog SSI.  Plan to add Farxiga vs Jardiance prior to discharge  Hypertension: being Rx with CHF meds improved     For questions or updates, please contact [facility] Please consult [email or web address] for contact info under Cardiology/STEMI.   Signed, Jenkins Rouge, MD  07/24/2020, 8:39 AM

## 2020-07-25 ENCOUNTER — Inpatient Hospital Stay (HOSPITAL_COMMUNITY): Payer: No Typology Code available for payment source | Admitting: Certified Registered Nurse Anesthetist

## 2020-07-25 ENCOUNTER — Inpatient Hospital Stay (HOSPITAL_COMMUNITY): Payer: No Typology Code available for payment source | Admitting: Certified Registered"

## 2020-07-25 ENCOUNTER — Inpatient Hospital Stay (HOSPITAL_COMMUNITY): Payer: No Typology Code available for payment source

## 2020-07-25 ENCOUNTER — Encounter (HOSPITAL_COMMUNITY): Admission: EM | Disposition: E | Payer: Self-pay | Source: Ambulatory Visit | Attending: Cardiothoracic Surgery

## 2020-07-25 ENCOUNTER — Ambulatory Visit: Payer: Self-pay | Admitting: Cardiology

## 2020-07-25 ENCOUNTER — Inpatient Hospital Stay (HOSPITAL_COMMUNITY): Admission: EM | Disposition: E | Payer: Self-pay | Source: Ambulatory Visit | Attending: Cardiothoracic Surgery

## 2020-07-25 DIAGNOSIS — Z9889 Other specified postprocedural states: Secondary | ICD-10-CM

## 2020-07-25 DIAGNOSIS — I214 Non-ST elevation (NSTEMI) myocardial infarction: Secondary | ICD-10-CM | POA: Diagnosis not present

## 2020-07-25 DIAGNOSIS — I5023 Acute on chronic systolic (congestive) heart failure: Secondary | ICD-10-CM | POA: Diagnosis not present

## 2020-07-25 DIAGNOSIS — I25110 Atherosclerotic heart disease of native coronary artery with unstable angina pectoris: Secondary | ICD-10-CM | POA: Diagnosis not present

## 2020-07-25 DIAGNOSIS — Z951 Presence of aortocoronary bypass graft: Secondary | ICD-10-CM

## 2020-07-25 HISTORY — PX: CORONARY ARTERY BYPASS GRAFT: SHX141

## 2020-07-25 HISTORY — PX: EXPLORATION POST OPERATIVE OPEN HEART: SHX5061

## 2020-07-25 HISTORY — PX: TEE WITHOUT CARDIOVERSION: SHX5443

## 2020-07-25 LAB — POCT I-STAT, CHEM 8
BUN: 23 mg/dL (ref 8–23)
BUN: 25 mg/dL — ABNORMAL HIGH (ref 8–23)
BUN: 22 mg/dL (ref 8–23)
BUN: 24 mg/dL — ABNORMAL HIGH (ref 8–23)
BUN: 22 mg/dL (ref 8–23)
BUN: 24 mg/dL — ABNORMAL HIGH (ref 8–23)
BUN: 19 mg/dL (ref 8–23)
Calcium, Ion: 1.27 mmol/L (ref 1.15–1.40)
Calcium, Ion: 1.2 mmol/L (ref 1.15–1.40)
Calcium, Ion: 1.11 mmol/L — ABNORMAL LOW (ref 1.15–1.40)
Calcium, Ion: 1.05 mmol/L — ABNORMAL LOW (ref 1.15–1.40)
Calcium, Ion: 1.25 mmol/L (ref 1.15–1.40)
Calcium, Ion: 1.27 mmol/L (ref 1.15–1.40)
Calcium, Ion: 0.78 mmol/L — CL (ref 1.15–1.40)
Chloride: 99 mmol/L (ref 98–111)
Chloride: 99 mmol/L (ref 98–111)
Chloride: 100 mmol/L (ref 98–111)
Chloride: 99 mmol/L (ref 98–111)
Chloride: 101 mmol/L (ref 98–111)
Chloride: 101 mmol/L (ref 98–111)
Chloride: 103 mmol/L (ref 98–111)
Creatinine, Ser: 0.9 mg/dL (ref 0.61–1.24)
Creatinine, Ser: 0.9 mg/dL (ref 0.61–1.24)
Creatinine, Ser: 0.9 mg/dL (ref 0.61–1.24)
Creatinine, Ser: 0.8 mg/dL (ref 0.61–1.24)
Creatinine, Ser: 0.8 mg/dL (ref 0.61–1.24)
Creatinine, Ser: 0.9 mg/dL (ref 0.61–1.24)
Creatinine, Ser: 0.9 mg/dL (ref 0.61–1.24)
Glucose, Bld: 156 mg/dL — ABNORMAL HIGH (ref 70–99)
Glucose, Bld: 183 mg/dL — ABNORMAL HIGH (ref 70–99)
Glucose, Bld: 170 mg/dL — ABNORMAL HIGH (ref 70–99)
Glucose, Bld: 151 mg/dL — ABNORMAL HIGH (ref 70–99)
Glucose, Bld: 164 mg/dL — ABNORMAL HIGH (ref 70–99)
Glucose, Bld: 177 mg/dL — ABNORMAL HIGH (ref 70–99)
Glucose, Bld: 178 mg/dL — ABNORMAL HIGH (ref 70–99)
HCT: 35 % — ABNORMAL LOW (ref 39.0–52.0)
HCT: 32 % — ABNORMAL LOW (ref 39.0–52.0)
HCT: 27 % — ABNORMAL LOW (ref 39.0–52.0)
HCT: 26 % — ABNORMAL LOW (ref 39.0–52.0)
HCT: 27 % — ABNORMAL LOW (ref 39.0–52.0)
HCT: 28 % — ABNORMAL LOW (ref 39.0–52.0)
HCT: 32 % — ABNORMAL LOW (ref 39.0–52.0)
Hemoglobin: 11.9 g/dL — ABNORMAL LOW (ref 13.0–17.0)
Hemoglobin: 10.9 g/dL — ABNORMAL LOW (ref 13.0–17.0)
Hemoglobin: 9.2 g/dL — ABNORMAL LOW (ref 13.0–17.0)
Hemoglobin: 8.8 g/dL — ABNORMAL LOW (ref 13.0–17.0)
Hemoglobin: 9.2 g/dL — ABNORMAL LOW (ref 13.0–17.0)
Hemoglobin: 9.5 g/dL — ABNORMAL LOW (ref 13.0–17.0)
Hemoglobin: 10.9 g/dL — ABNORMAL LOW (ref 13.0–17.0)
Potassium: 4.6 mmol/L (ref 3.5–5.1)
Potassium: 4.7 mmol/L (ref 3.5–5.1)
Potassium: 5.1 mmol/L (ref 3.5–5.1)
Potassium: 5.1 mmol/L (ref 3.5–5.1)
Potassium: 4.2 mmol/L (ref 3.5–5.1)
Potassium: 4.2 mmol/L (ref 3.5–5.1)
Potassium: 4.3 mmol/L (ref 3.5–5.1)
Sodium: 135 mmol/L (ref 135–145)
Sodium: 135 mmol/L (ref 135–145)
Sodium: 134 mmol/L — ABNORMAL LOW (ref 135–145)
Sodium: 135 mmol/L (ref 135–145)
Sodium: 135 mmol/L (ref 135–145)
Sodium: 136 mmol/L (ref 135–145)
Sodium: 140 mmol/L (ref 135–145)
TCO2: 26 mmol/L (ref 22–32)
TCO2: 24 mmol/L (ref 22–32)
TCO2: 27 mmol/L (ref 22–32)
TCO2: 25 mmol/L (ref 22–32)
TCO2: 24 mmol/L (ref 22–32)
TCO2: 25 mmol/L (ref 22–32)
TCO2: 20 mmol/L — ABNORMAL LOW (ref 22–32)

## 2020-07-25 LAB — POCT I-STAT 7, (LYTES, BLD GAS, ICA,H+H)
Acid-Base Excess: 0 mmol/L (ref 0.0–2.0)
Acid-Base Excess: 1 mmol/L (ref 0.0–2.0)
Acid-Base Excess: 2 mmol/L (ref 0.0–2.0)
Acid-Base Excess: 2 mmol/L (ref 0.0–2.0)
Acid-base deficit: 1 mmol/L (ref 0.0–2.0)
Acid-base deficit: 1 mmol/L (ref 0.0–2.0)
Acid-base deficit: 1 mmol/L (ref 0.0–2.0)
Acid-base deficit: 2 mmol/L (ref 0.0–2.0)
Acid-base deficit: 4 mmol/L — ABNORMAL HIGH (ref 0.0–2.0)
Acid-base deficit: 10 mmol/L — ABNORMAL HIGH (ref 0.0–2.0)
Acid-base deficit: 5 mmol/L — ABNORMAL HIGH (ref 0.0–2.0)
Bicarbonate: 26.3 mmol/L (ref 20.0–28.0)
Bicarbonate: 25.5 mmol/L (ref 20.0–28.0)
Bicarbonate: 25.6 mmol/L (ref 20.0–28.0)
Bicarbonate: 26.8 mmol/L (ref 20.0–28.0)
Bicarbonate: 26.1 mmol/L (ref 20.0–28.0)
Bicarbonate: 26.5 mmol/L (ref 20.0–28.0)
Bicarbonate: 24.6 mmol/L (ref 20.0–28.0)
Bicarbonate: 24.7 mmol/L (ref 20.0–28.0)
Bicarbonate: 22.8 mmol/L (ref 20.0–28.0)
Bicarbonate: 18.2 mmol/L — ABNORMAL LOW (ref 20.0–28.0)
Bicarbonate: 21.8 mmol/L (ref 20.0–28.0)
Calcium, Ion: 1.26 mmol/L (ref 1.15–1.40)
Calcium, Ion: 1.22 mmol/L (ref 1.15–1.40)
Calcium, Ion: 1.07 mmol/L — ABNORMAL LOW (ref 1.15–1.40)
Calcium, Ion: 1.18 mmol/L (ref 1.15–1.40)
Calcium, Ion: 1.11 mmol/L — ABNORMAL LOW (ref 1.15–1.40)
Calcium, Ion: 1.31 mmol/L (ref 1.15–1.40)
Calcium, Ion: 1.25 mmol/L (ref 1.15–1.40)
Calcium, Ion: 1.27 mmol/L (ref 1.15–1.40)
Calcium, Ion: 1.2 mmol/L (ref 1.15–1.40)
Calcium, Ion: 0.76 mmol/L — CL (ref 1.15–1.40)
Calcium, Ion: 1.11 mmol/L — ABNORMAL LOW (ref 1.15–1.40)
HCT: 36 % — ABNORMAL LOW (ref 39.0–52.0)
HCT: 32 % — ABNORMAL LOW (ref 39.0–52.0)
HCT: 26 % — ABNORMAL LOW (ref 39.0–52.0)
HCT: 31 % — ABNORMAL LOW (ref 39.0–52.0)
HCT: 27 % — ABNORMAL LOW (ref 39.0–52.0)
HCT: 26 % — ABNORMAL LOW (ref 39.0–52.0)
HCT: 25 % — ABNORMAL LOW (ref 39.0–52.0)
HCT: 27 % — ABNORMAL LOW (ref 39.0–52.0)
HCT: 30 % — ABNORMAL LOW (ref 39.0–52.0)
HCT: 32 % — ABNORMAL LOW (ref 39.0–52.0)
HCT: 31 % — ABNORMAL LOW (ref 39.0–52.0)
Hemoglobin: 12.2 g/dL — ABNORMAL LOW (ref 13.0–17.0)
Hemoglobin: 10.9 g/dL — ABNORMAL LOW (ref 13.0–17.0)
Hemoglobin: 10.5 g/dL — ABNORMAL LOW (ref 13.0–17.0)
Hemoglobin: 8.8 g/dL — ABNORMAL LOW (ref 13.0–17.0)
Hemoglobin: 9.2 g/dL — ABNORMAL LOW (ref 13.0–17.0)
Hemoglobin: 8.8 g/dL — ABNORMAL LOW (ref 13.0–17.0)
Hemoglobin: 8.5 g/dL — ABNORMAL LOW (ref 13.0–17.0)
Hemoglobin: 9.2 g/dL — ABNORMAL LOW (ref 13.0–17.0)
Hemoglobin: 10.2 g/dL — ABNORMAL LOW (ref 13.0–17.0)
Hemoglobin: 10.9 g/dL — ABNORMAL LOW (ref 13.0–17.0)
Hemoglobin: 10.5 g/dL — ABNORMAL LOW (ref 13.0–17.0)
O2 Saturation: 89 %
O2 Saturation: 94 %
O2 Saturation: 100 %
O2 Saturation: 93 %
O2 Saturation: 100 %
O2 Saturation: 100 %
O2 Saturation: 99 %
O2 Saturation: 99 %
O2 Saturation: 95 %
O2 Saturation: 86 %
O2 Saturation: 99 %
Patient temperature: 35.8
Patient temperature: 35.1
Potassium: 4.6 mmol/L (ref 3.5–5.1)
Potassium: 4.3 mmol/L (ref 3.5–5.1)
Potassium: 4 mmol/L (ref 3.5–5.1)
Potassium: 4.7 mmol/L (ref 3.5–5.1)
Potassium: 4.3 mmol/L (ref 3.5–5.1)
Potassium: 5.1 mmol/L (ref 3.5–5.1)
Potassium: 4.2 mmol/L (ref 3.5–5.1)
Potassium: 4.2 mmol/L (ref 3.5–5.1)
Potassium: 4 mmol/L (ref 3.5–5.1)
Potassium: 4.3 mmol/L (ref 3.5–5.1)
Potassium: 3.7 mmol/L (ref 3.5–5.1)
Sodium: 135 mmol/L (ref 135–145)
Sodium: 134 mmol/L — ABNORMAL LOW (ref 135–145)
Sodium: 133 mmol/L — ABNORMAL LOW (ref 135–145)
Sodium: 134 mmol/L — ABNORMAL LOW (ref 135–145)
Sodium: 135 mmol/L (ref 135–145)
Sodium: 133 mmol/L — ABNORMAL LOW (ref 135–145)
Sodium: 135 mmol/L (ref 135–145)
Sodium: 136 mmol/L (ref 135–145)
Sodium: 138 mmol/L (ref 135–145)
Sodium: 140 mmol/L (ref 135–145)
Sodium: 141 mmol/L (ref 135–145)
TCO2: 28 mmol/L (ref 22–32)
TCO2: 27 mmol/L (ref 22–32)
TCO2: 27 mmol/L (ref 22–32)
TCO2: 28 mmol/L (ref 22–32)
TCO2: 27 mmol/L (ref 22–32)
TCO2: 28 mmol/L (ref 22–32)
TCO2: 26 mmol/L (ref 22–32)
TCO2: 26 mmol/L (ref 22–32)
TCO2: 24 mmol/L (ref 22–32)
TCO2: 20 mmol/L — ABNORMAL LOW (ref 22–32)
TCO2: 23 mmol/L (ref 22–32)
pCO2 arterial: 53.8 mmHg — ABNORMAL HIGH (ref 32.0–48.0)
pCO2 arterial: 49.3 mmHg — ABNORMAL HIGH (ref 32.0–48.0)
pCO2 arterial: 44.8 mmHg (ref 32.0–48.0)
pCO2 arterial: 46.7 mmHg (ref 32.0–48.0)
pCO2 arterial: 36 mmHg (ref 32.0–48.0)
pCO2 arterial: 42.5 mmHg (ref 32.0–48.0)
pCO2 arterial: 46.4 mmHg (ref 32.0–48.0)
pCO2 arterial: 51.9 mmHg — ABNORMAL HIGH (ref 32.0–48.0)
pCO2 arterial: 47.9 mmHg (ref 32.0–48.0)
pCO2 arterial: 47.7 mmHg (ref 32.0–48.0)
pCO2 arterial: 41.8 mmHg (ref 32.0–48.0)
pH, Arterial: 7.297 — ABNORMAL LOW (ref 7.350–7.450)
pH, Arterial: 7.322 — ABNORMAL LOW (ref 7.350–7.450)
pH, Arterial: 7.365 (ref 7.350–7.450)
pH, Arterial: 7.367 (ref 7.350–7.450)
pH, Arterial: 7.467 — ABNORMAL HIGH (ref 7.350–7.450)
pH, Arterial: 7.403 (ref 7.350–7.450)
pH, Arterial: 7.286 — ABNORMAL LOW (ref 7.350–7.450)
pH, Arterial: 7.333 — ABNORMAL LOW (ref 7.350–7.450)
pH, Arterial: 7.28 — ABNORMAL LOW (ref 7.350–7.450)
pH, Arterial: 7.189 — CL (ref 7.350–7.450)
pH, Arterial: 7.316 — ABNORMAL LOW (ref 7.350–7.450)
pO2, Arterial: 64 mmHg — ABNORMAL LOW (ref 83.0–108.0)
pO2, Arterial: 76 mmHg — ABNORMAL LOW (ref 83.0–108.0)
pO2, Arterial: 374 mmHg — ABNORMAL HIGH (ref 83.0–108.0)
pO2, Arterial: 71 mmHg — ABNORMAL LOW (ref 83.0–108.0)
pO2, Arterial: 312 mmHg — ABNORMAL HIGH (ref 83.0–108.0)
pO2, Arterial: 313 mmHg — ABNORMAL HIGH (ref 83.0–108.0)
pO2, Arterial: 134 mmHg — ABNORMAL HIGH (ref 83.0–108.0)
pO2, Arterial: 164 mmHg — ABNORMAL HIGH (ref 83.0–108.0)
pO2, Arterial: 80 mmHg — ABNORMAL LOW (ref 83.0–108.0)
pO2, Arterial: 64 mmHg — ABNORMAL LOW (ref 83.0–108.0)
pO2, Arterial: 128 mmHg — ABNORMAL HIGH (ref 83.0–108.0)

## 2020-07-25 LAB — CBC
HCT: 38.6 % — ABNORMAL LOW (ref 39.0–52.0)
HCT: 28.9 % — ABNORMAL LOW (ref 39.0–52.0)
HCT: 29.6 % — ABNORMAL LOW (ref 39.0–52.0)
Hemoglobin: 12.4 g/dL — ABNORMAL LOW (ref 13.0–17.0)
Hemoglobin: 9.2 g/dL — ABNORMAL LOW (ref 13.0–17.0)
Hemoglobin: 10.2 g/dL — ABNORMAL LOW (ref 13.0–17.0)
MCH: 27.6 pg (ref 26.0–34.0)
MCH: 27.6 pg (ref 26.0–34.0)
MCH: 29.7 pg (ref 26.0–34.0)
MCHC: 32.1 g/dL (ref 30.0–36.0)
MCHC: 31.8 g/dL (ref 30.0–36.0)
MCHC: 34.5 g/dL (ref 30.0–36.0)
MCV: 86 fL (ref 80.0–100.0)
MCV: 86.8 fL (ref 80.0–100.0)
MCV: 86 fL (ref 80.0–100.0)
Platelets: 249 10*3/uL (ref 150–400)
Platelets: 179 10*3/uL (ref 150–400)
Platelets: 78 10*3/uL — ABNORMAL LOW (ref 150–400)
RBC: 4.49 MIL/uL (ref 4.22–5.81)
RBC: 3.33 MIL/uL — ABNORMAL LOW (ref 4.22–5.81)
RBC: 3.44 MIL/uL — ABNORMAL LOW (ref 4.22–5.81)
RDW: 14.8 % (ref 11.5–15.5)
RDW: 14.9 % (ref 11.5–15.5)
RDW: 13.9 % (ref 11.5–15.5)
WBC: 5.8 10*3/uL (ref 4.0–10.5)
WBC: 11.4 10*3/uL — ABNORMAL HIGH (ref 4.0–10.5)
WBC: 5.4 10*3/uL (ref 4.0–10.5)
nRBC: 0 % (ref 0.0–0.2)
nRBC: 0 % (ref 0.0–0.2)
nRBC: 0 % (ref 0.0–0.2)

## 2020-07-25 LAB — PREPARE RBC (CROSSMATCH)

## 2020-07-25 LAB — COMPREHENSIVE METABOLIC PANEL
ALT: 17 U/L (ref 0–44)
AST: 34 U/L (ref 15–41)
Albumin: 3.6 g/dL (ref 3.5–5.0)
Alkaline Phosphatase: 33 U/L — ABNORMAL LOW (ref 38–126)
Anion gap: 7 (ref 5–15)
BUN: 17 mg/dL (ref 8–23)
CO2: 22 mmol/L (ref 22–32)
Calcium: 7.8 mg/dL — ABNORMAL LOW (ref 8.9–10.3)
Chloride: 108 mmol/L (ref 98–111)
Creatinine, Ser: 0.94 mg/dL (ref 0.61–1.24)
GFR, Estimated: >60 mL/min (ref >60)
Glucose, Bld: 133 mg/dL — ABNORMAL HIGH (ref 70–99)
Potassium: 3.5 mmol/L (ref 3.5–5.1)
Sodium: 137 mmol/L (ref 135–145)
Total Bilirubin: 2.4 mg/dL — ABNORMAL HIGH (ref 0.3–1.2)
Total Protein: 4.7 g/dL — ABNORMAL LOW (ref 6.5–8.1)

## 2020-07-25 LAB — POCT I-STAT EG7
Acid-Base Excess: 0 mmol/L (ref 0.0–2.0)
Bicarbonate: 26.1 mmol/L (ref 20.0–28.0)
Calcium, Ion: 1.11 mmol/L — ABNORMAL LOW (ref 1.15–1.40)
HCT: 26 % — ABNORMAL LOW (ref 39.0–52.0)
Hemoglobin: 8.8 g/dL — ABNORMAL LOW (ref 13.0–17.0)
O2 Saturation: 77 %
Potassium: 3.8 mmol/L (ref 3.5–5.1)
Sodium: 134 mmol/L — ABNORMAL LOW (ref 135–145)
TCO2: 28 mmol/L (ref 22–32)
pCO2, Ven: 48 mmHg (ref 44.0–60.0)
pH, Ven: 7.343 (ref 7.250–7.430)
pO2, Ven: 44 mmHg (ref 32.0–45.0)

## 2020-07-25 LAB — PROTIME-INR
INR: 1.4 — ABNORMAL HIGH (ref 0.8–1.2)
INR: 1.5 — ABNORMAL HIGH (ref 0.8–1.2)
Prothrombin Time: 17.1 seconds — ABNORMAL HIGH (ref 11.4–15.2)
Prothrombin Time: 17.9 seconds — ABNORMAL HIGH (ref 11.4–15.2)

## 2020-07-25 LAB — BASIC METABOLIC PANEL
Anion gap: 9 (ref 5–15)
BUN: 21 mg/dL (ref 8–23)
CO2: 27 mmol/L (ref 22–32)
Calcium: 9.3 mg/dL (ref 8.9–10.3)
Chloride: 98 mmol/L (ref 98–111)
Creatinine, Ser: 0.98 mg/dL (ref 0.61–1.24)
GFR, Estimated: >60 mL/min (ref >60)
Glucose, Bld: 133 mg/dL — ABNORMAL HIGH (ref 70–99)
Potassium: 4.2 mmol/L (ref 3.5–5.1)
Sodium: 134 mmol/L — ABNORMAL LOW (ref 135–145)

## 2020-07-25 LAB — FIBRINOGEN: Fibrinogen: 243 mg/dL (ref 210–475)

## 2020-07-25 LAB — GLUCOSE, CAPILLARY
Glucose-Capillary: 139 mg/dL — ABNORMAL HIGH (ref 70–99)
Glucose-Capillary: 149 mg/dL — ABNORMAL HIGH (ref 70–99)
Glucose-Capillary: 120 mg/dL — ABNORMAL HIGH (ref 70–99)
Glucose-Capillary: 140 mg/dL — ABNORMAL HIGH (ref 70–99)
Glucose-Capillary: 138 mg/dL — ABNORMAL HIGH (ref 70–99)
Glucose-Capillary: 146 mg/dL — ABNORMAL HIGH (ref 70–99)

## 2020-07-25 LAB — PLATELET COUNT: Platelets: 184 10*3/uL (ref 150–400)

## 2020-07-25 LAB — APTT
aPTT: 99 seconds — ABNORMAL HIGH (ref 24–36)
aPTT: 41 seconds — ABNORMAL HIGH (ref 24–36)

## 2020-07-25 LAB — HEMOGLOBIN AND HEMATOCRIT, BLOOD
HCT: 27.2 % — ABNORMAL LOW (ref 39.0–52.0)
Hemoglobin: 8.9 g/dL — ABNORMAL LOW (ref 13.0–17.0)

## 2020-07-25 SURGERY — EXPLORATION POST OPERATIVE OPEN HEART
Anesthesia: General | Site: Chest

## 2020-07-25 SURGERY — CORONARY ARTERY BYPASS GRAFTING (CABG)
Anesthesia: General | Site: Chest

## 2020-07-25 MED ORDER — AMIODARONE HCL IN DEXTROSE 360-4.14 MG/200ML-% IV SOLN
INTRAVENOUS | Status: DC | PRN
  Administered 2020-07-25: 60 mg/h via INTRAVENOUS

## 2020-07-25 MED ORDER — ASPIRIN EC 325 MG PO TBEC: 325.0000 mg | DELAYED_RELEASE_TABLET | Freq: Every day | ORAL | Status: DC

## 2020-07-25 MED ORDER — TRANEXAMIC ACID 1000 MG/10ML IV SOLN
1.5000 mg/kg/h | INTRAVENOUS | Status: DC
  Filled 2020-07-25: qty 25

## 2020-07-25 MED ORDER — SODIUM CHLORIDE 0.9% FLUSH
3.0000 mL | Freq: Two times a day (BID) | INTRAVENOUS | Status: DC
  Administered 2020-07-26 – 2020-08-04 (×12): 3 mL via INTRAVENOUS

## 2020-07-25 MED ORDER — VANCOMYCIN HCL 1000 MG IV SOLR
INTRAVENOUS | Status: AC
  Filled 2020-07-25: qty 3000

## 2020-07-25 MED ORDER — SODIUM CHLORIDE 0.9% FLUSH
3.0000 mL | INTRAVENOUS | Status: DC | PRN
  Administered 2020-07-29: 3 mL via INTRAVENOUS

## 2020-07-25 MED ORDER — NITROGLYCERIN IN D5W 200-5 MCG/ML-% IV SOLN
2.0000 ug/min | INTRAVENOUS | Status: DC
  Filled 2020-07-25: qty 250

## 2020-07-25 MED ORDER — VASOPRESSIN 20 UNIT/ML IV SOLN
INTRAVENOUS | Status: AC
  Filled 2020-07-25: qty 1

## 2020-07-25 MED ORDER — MIDAZOLAM HCL 2 MG/2ML IJ SOLN
INTRAMUSCULAR | Status: AC
  Filled 2020-07-25: qty 2

## 2020-07-25 MED ORDER — ALBUMIN HUMAN 5 % IV SOLN
250.0000 mL | INTRAVENOUS | Status: AC | PRN
  Administered 2020-07-25 – 2020-07-26 (×7): 12.5 g via INTRAVENOUS
  Filled 2020-07-25 (×4): qty 250

## 2020-07-25 MED ORDER — OXYCODONE HCL 5 MG PO TABS: 5.0000 mg | ORAL_TABLET | ORAL | Status: DC | PRN

## 2020-07-25 MED ORDER — ACETAMINOPHEN 650 MG RE SUPP
650.0000 mg | Freq: Once | RECTAL | Status: AC
  Administered 2020-07-25: 650 mg via RECTAL

## 2020-07-25 MED ORDER — NICARDIPINE HCL IN NACL 20-0.86 MG/200ML-% IV SOLN
3.0000 mg/h | INTRAVENOUS | Status: DC
  Administered 2020-07-25: 5 mg/h via INTRAVENOUS
  Filled 2020-07-25 (×2): qty 200

## 2020-07-25 MED ORDER — ROSUVASTATIN CALCIUM 20 MG PO TABS: 40.0000 mg | ORAL_TABLET | Freq: Every day | ORAL | Status: DC

## 2020-07-25 MED ORDER — MIDAZOLAM HCL 5 MG/5ML IJ SOLN
INTRAMUSCULAR | Status: DC | PRN
  Administered 2020-07-25 (×2): 2 mg via INTRAVENOUS
  Administered 2020-07-25: 3 mg via INTRAVENOUS
  Administered 2020-07-25: 2 mg via INTRAVENOUS
  Administered 2020-07-25: 1 mg via INTRAVENOUS

## 2020-07-25 MED ORDER — POTASSIUM CHLORIDE 2 MEQ/ML IV SOLN
80.0000 meq | INTRAVENOUS | Status: DC
  Filled 2020-07-25: qty 40

## 2020-07-25 MED ORDER — SODIUM CHLORIDE 0.9 % IV SOLN
20.0000 ug | INTRAVENOUS | Status: AC
  Administered 2020-07-25: 20 ug via INTRAVENOUS
  Filled 2020-07-25: qty 5

## 2020-07-25 MED ORDER — SODIUM CHLORIDE 0.9 % IV SOLN
1.5000 g | Freq: Two times a day (BID) | INTRAVENOUS | Status: AC
  Administered 2020-07-25 – 2020-07-27 (×4): 1.5 g via INTRAVENOUS
  Filled 2020-07-25 (×4): qty 1.5

## 2020-07-25 MED ORDER — ETOMIDATE 2 MG/ML IV SOLN
INTRAVENOUS | Status: DC | PRN
  Administered 2020-07-25: 10 mg via INTRAVENOUS

## 2020-07-25 MED ORDER — BUPIVACAINE LIPOSOME 1.3 % IJ SUSP
INTRAMUSCULAR | Status: DC | PRN
  Administered 2020-07-25: 50 mL

## 2020-07-25 MED ORDER — ONDANSETRON HCL 4 MG/2ML IJ SOLN
INTRAMUSCULAR | Status: DC | PRN
  Administered 2020-07-25: 4 mg via INTRAVENOUS

## 2020-07-25 MED ORDER — PHENYLEPHRINE 40 MCG/ML (10ML) SYRINGE FOR IV PUSH (FOR BLOOD PRESSURE SUPPORT)
PREFILLED_SYRINGE | INTRAVENOUS | Status: AC
  Filled 2020-07-25: qty 10

## 2020-07-25 MED ORDER — ACETAMINOPHEN 160 MG/5ML PO SOLN
1000.0000 mg | Freq: Four times a day (QID) | ORAL | Status: DC
  Administered 2020-07-26 – 2020-07-28 (×10): 1000 mg
  Filled 2020-07-25 (×10): qty 40.6

## 2020-07-25 MED ORDER — STERILE WATER FOR INJECTION IJ SOLN
INTRAMUSCULAR | Status: AC
  Filled 2020-07-25: qty 10

## 2020-07-25 MED ORDER — MAGNESIUM SULFATE 4 GM/100ML IV SOLN
4.0000 g | Freq: Once | INTRAVENOUS | Status: AC
  Administered 2020-07-25: 4 g via INTRAVENOUS
  Filled 2020-07-25: qty 100

## 2020-07-25 MED ORDER — ROCURONIUM BROMIDE 10 MG/ML (PF) SYRINGE
PREFILLED_SYRINGE | INTRAVENOUS | Status: DC | PRN
  Administered 2020-07-25 (×2): 50 mg via INTRAVENOUS

## 2020-07-25 MED ORDER — VANCOMYCIN HCL 1000 MG IV SOLR
INTRAVENOUS | Status: DC | PRN
  Administered 2020-07-25: 1000 mL

## 2020-07-25 MED ORDER — PROTAMINE SULFATE 10 MG/ML IV SOLN
INTRAVENOUS | Status: AC
  Filled 2020-07-25: qty 5

## 2020-07-25 MED ORDER — EPINEPHRINE HCL 5 MG/250ML IV SOLN IN NS
0.0000 ug/min | INTRAVENOUS | Status: DC
  Administered 2020-07-26 (×2): 4 ug/min via INTRAVENOUS
  Administered 2020-07-28: 2 ug/min via INTRAVENOUS
  Filled 2020-07-25 (×4): qty 250

## 2020-07-25 MED ORDER — EPHEDRINE 5 MG/ML INJ
INTRAVENOUS | Status: AC
  Filled 2020-07-25: qty 10

## 2020-07-25 MED ORDER — HEPARIN SODIUM (PORCINE) 1000 UNIT/ML IJ SOLN
INTRAMUSCULAR | Status: DC | PRN
  Administered 2020-07-25 (×2): 5000 [IU] via INTRAVENOUS
  Administered 2020-07-25: 28000 [IU] via INTRAVENOUS
  Administered 2020-07-25: 5000 [IU] via INTRAVENOUS

## 2020-07-25 MED ORDER — HEPARIN SODIUM (PORCINE) 1000 UNIT/ML IJ SOLN
INTRAMUSCULAR | Status: AC
  Filled 2020-07-25: qty 1

## 2020-07-25 MED ORDER — CHLORHEXIDINE GLUCONATE CLOTH 2 % EX PADS
6.0000 | MEDICATED_PAD | Freq: Every day | CUTANEOUS | Status: DC
  Administered 2020-07-26 – 2020-08-23 (×29): 6 via TOPICAL

## 2020-07-25 MED ORDER — MILRINONE LACTATE IN DEXTROSE 20-5 MG/100ML-% IV SOLN
0.3000 ug/kg/min | INTRAVENOUS | Status: DC
  Administered 2020-07-26: 0.375 ug/kg/min via INTRAVENOUS
  Filled 2020-07-25: qty 100

## 2020-07-25 MED ORDER — FENTANYL CITRATE (PF) 250 MCG/5ML IJ SOLN
INTRAMUSCULAR | Status: DC | PRN
  Administered 2020-07-25: 400 ug via INTRAVENOUS

## 2020-07-25 MED ORDER — TRAMADOL HCL 50 MG PO TABS: 50.0000 mg | ORAL_TABLET | ORAL | Status: DC | PRN

## 2020-07-25 MED ORDER — DEXAMETHASONE SODIUM PHOSPHATE 10 MG/ML IJ SOLN
INTRAMUSCULAR | Status: AC
  Filled 2020-07-25: qty 1

## 2020-07-25 MED ORDER — BISACODYL 10 MG RE SUPP
10.0000 mg | Freq: Every day | RECTAL | Status: DC
  Administered 2020-07-26: 10 mg via RECTAL
  Filled 2020-07-25: qty 1

## 2020-07-25 MED ORDER — 0.9 % SODIUM CHLORIDE (POUR BTL) OPTIME
TOPICAL | Status: DC | PRN
  Administered 2020-07-25: 3000 mL

## 2020-07-25 MED ORDER — PLATELET POOR PLASMA OPTIME
Status: DC | PRN
  Administered 2020-07-25: 10 mL

## 2020-07-25 MED ORDER — PROTAMINE SULFATE 10 MG/ML IV SOLN
INTRAVENOUS | Status: DC | PRN
  Administered 2020-07-25 (×2): 50 mg via INTRAVENOUS

## 2020-07-25 MED ORDER — ASPIRIN 81 MG PO CHEW
324.0000 mg | CHEWABLE_TABLET | Freq: Every day | ORAL | Status: DC
  Administered 2020-07-26 – 2020-08-23 (×29): 324 mg
  Filled 2020-07-25 (×30): qty 4

## 2020-07-25 MED ORDER — MORPHINE SULFATE (PF) 2 MG/ML IV SOLN
1.0000 mg | INTRAVENOUS | Status: DC | PRN
  Administered 2020-07-26: 4 mg via INTRAVENOUS
  Administered 2020-07-26: 2 mg via INTRAVENOUS
  Administered 2020-07-26: 4 mg via INTRAVENOUS
  Administered 2020-07-26 (×2): 2 mg via INTRAVENOUS
  Administered 2020-07-26: 4 mg via INTRAVENOUS
  Administered 2020-07-26: 2 mg via INTRAVENOUS
  Administered 2020-07-26 – 2020-07-27 (×2): 4 mg via INTRAVENOUS
  Administered 2020-07-28: 2 mg via INTRAVENOUS
  Administered 2020-07-28: 4 mg via INTRAVENOUS
  Administered 2020-07-28 – 2020-07-31 (×14): 2 mg via INTRAVENOUS
  Filled 2020-07-25: qty 1
  Filled 2020-07-25: qty 2
  Filled 2020-07-25 (×2): qty 1
  Filled 2020-07-25 (×6): qty 2
  Filled 2020-07-25 (×2): qty 1
  Filled 2020-07-25: qty 2
  Filled 2020-07-25: qty 1
  Filled 2020-07-25: qty 2
  Filled 2020-07-25 (×2): qty 1
  Filled 2020-07-25: qty 2
  Filled 2020-07-25: qty 1
  Filled 2020-07-25 (×2): qty 2

## 2020-07-25 MED ORDER — NITROGLYCERIN IN D5W 200-5 MCG/ML-% IV SOLN
INTRAVENOUS | Status: AC
  Filled 2020-07-25: qty 250

## 2020-07-25 MED ORDER — LACTATED RINGERS IV SOLN: INTRAVENOUS | Status: DC

## 2020-07-25 MED ORDER — HEPARIN SODIUM (PORCINE) 5000 UNIT/ML IJ SOLN
INTRAVENOUS | Status: DC
  Filled 2020-07-25: qty 10

## 2020-07-25 MED ORDER — SODIUM CHLORIDE 0.9 % IV SOLN
750.0000 mg | INTRAVENOUS | Status: DC
  Filled 2020-07-25: qty 750

## 2020-07-25 MED ORDER — ACETAMINOPHEN 500 MG PO TABS: 1000.0000 mg | ORAL_TABLET | Freq: Four times a day (QID) | ORAL | Status: DC

## 2020-07-25 MED ORDER — ALBUMIN HUMAN 5 % IV SOLN
12.5000 g | Freq: Once | INTRAVENOUS | Status: AC
  Administered 2020-07-25: 12.5 g via INTRAVENOUS
  Filled 2020-07-25: qty 250

## 2020-07-25 MED ORDER — GABAPENTIN 600 MG PO TABS
600.0000 mg | ORAL_TABLET | Freq: Three times a day (TID) | ORAL | Status: DC
  Administered 2020-07-26 – 2020-07-27 (×6): 600 mg via ORAL
  Filled 2020-07-25 (×7): qty 1

## 2020-07-25 MED ORDER — VANCOMYCIN HCL 1000 MG IV SOLR
INTRAVENOUS | Status: DC | PRN
  Administered 2020-07-25: 3 g via TOPICAL

## 2020-07-25 MED ORDER — DEXTROSE 50 % IV SOLN: 0.0000 mL | INTRAVENOUS | Status: DC | PRN

## 2020-07-25 MED ORDER — SODIUM CHLORIDE 0.9 % IV SOLN
INTRAVENOUS | Status: AC
  Filled 2020-07-25: qty 1.2

## 2020-07-25 MED ORDER — FAMOTIDINE IN NACL 20-0.9 MG/50ML-% IV SOLN
20.0000 mg | Freq: Two times a day (BID) | INTRAVENOUS | Status: AC
  Administered 2020-07-25 – 2020-07-26 (×2): 20 mg via INTRAVENOUS
  Filled 2020-07-25: qty 50

## 2020-07-25 MED ORDER — VANCOMYCIN HCL 1500 MG/300ML IV SOLN
1500.0000 mg | INTRAVENOUS | Status: DC
  Filled 2020-07-25: qty 300

## 2020-07-25 MED ORDER — BUPIVACAINE HCL (PF) 0.5 % IJ SOLN
INTRAMUSCULAR | Status: AC
  Filled 2020-07-25: qty 30

## 2020-07-25 MED ORDER — NOREPINEPHRINE 4 MG/250ML-% IV SOLN
0.0000 ug/min | INTRAVENOUS | Status: AC
  Administered 2020-07-25: 4 ug/min via INTRAVENOUS
  Filled 2020-07-25: qty 250

## 2020-07-25 MED ORDER — FENTANYL CITRATE (PF) 250 MCG/5ML IJ SOLN
INTRAMUSCULAR | Status: AC
  Filled 2020-07-25: qty 25

## 2020-07-25 MED ORDER — EPINEPHRINE 1 MG/10ML IJ SOSY
PREFILLED_SYRINGE | INTRAMUSCULAR | Status: AC
  Filled 2020-07-25: qty 10

## 2020-07-25 MED ORDER — ALBUMIN HUMAN 5 % IV SOLN: INTRAVENOUS | Status: DC | PRN

## 2020-07-25 MED ORDER — LACTATED RINGERS IV SOLN: 500.0000 mL | Freq: Once | INTRAVENOUS | Status: DC | PRN

## 2020-07-25 MED ORDER — METOPROLOL TARTRATE 5 MG/5ML IV SOLN
2.5000 mg | INTRAVENOUS | Status: DC | PRN
  Filled 2020-07-25: qty 5

## 2020-07-25 MED ORDER — VASOPRESSIN 20 UNITS/100 ML INFUSION FOR SHOCK
0.0000 [IU]/min | INTRAVENOUS | Status: DC
  Filled 2020-07-25: qty 100

## 2020-07-25 MED ORDER — SUCCINYLCHOLINE CHLORIDE 200 MG/10ML IV SOSY
PREFILLED_SYRINGE | INTRAVENOUS | Status: AC
  Filled 2020-07-25: qty 10

## 2020-07-25 MED ORDER — SODIUM CHLORIDE 0.9 % IV SOLN: INTRAVENOUS | Status: DC | PRN

## 2020-07-25 MED ORDER — PLASMA-LYTE A IV SOLN: INTRAVENOUS | Status: DC

## 2020-07-25 MED ORDER — EPHEDRINE SULFATE-NACL 50-0.9 MG/10ML-% IV SOSY
PREFILLED_SYRINGE | INTRAVENOUS | Status: DC | PRN
  Administered 2020-07-25: 5 mg via INTRAVENOUS
  Administered 2020-07-25: 10 mg via INTRAVENOUS

## 2020-07-25 MED ORDER — LACTATED RINGERS IV SOLN: INTRAVENOUS | Status: DC | PRN

## 2020-07-25 MED ORDER — PROTAMINE SULFATE 10 MG/ML IV SOLN
INTRAVENOUS | Status: DC | PRN
  Administered 2020-07-25: 30 mg via INTRAVENOUS

## 2020-07-25 MED ORDER — ONDANSETRON HCL 4 MG/2ML IJ SOLN
INTRAMUSCULAR | Status: AC
  Filled 2020-07-25: qty 2

## 2020-07-25 MED ORDER — 0.9 % SODIUM CHLORIDE (POUR BTL) OPTIME
TOPICAL | Status: DC | PRN
  Administered 2020-07-25: 4000 mL
  Administered 2020-07-25 (×2): 1000 mL

## 2020-07-25 MED ORDER — DULOXETINE HCL 30 MG PO CPEP
30.0000 mg | ORAL_CAPSULE | Freq: Every day | ORAL | Status: DC
  Filled 2020-07-25: qty 1

## 2020-07-25 MED ORDER — CHLORHEXIDINE GLUCONATE 0.12 % MT SOLN
15.0000 mL | OROMUCOSAL | Status: AC
  Administered 2020-07-25: 15 mL via OROMUCOSAL

## 2020-07-25 MED ORDER — ALBUMIN HUMAN 5 % IV SOLN
12.5000 g | Freq: Once | INTRAVENOUS | Status: AC
  Administered 2020-07-25 – 2020-07-26 (×2): 12.5 g via INTRAVENOUS
  Filled 2020-07-25: qty 250

## 2020-07-25 MED ORDER — METOPROLOL TARTRATE 25 MG/10 ML ORAL SUSPENSION: 12.5000 mg | Freq: Two times a day (BID) | ORAL | Status: DC

## 2020-07-25 MED ORDER — BUPIVACAINE LIPOSOME 1.3 % IJ SUSP
INTRAMUSCULAR | Status: AC
  Filled 2020-07-25: qty 20

## 2020-07-25 MED ORDER — CALCIUM CHLORIDE 10 % IV SOLN
INTRAVENOUS | Status: DC | PRN
  Administered 2020-07-25: 1 g via INTRAVENOUS

## 2020-07-25 MED ORDER — PANTOPRAZOLE SODIUM 40 MG PO TBEC: 40.0000 mg | DELAYED_RELEASE_TABLET | Freq: Every day | ORAL | Status: DC

## 2020-07-25 MED ORDER — SODIUM CHLORIDE 0.9 % IV SOLN
INTRAVENOUS | Status: DC
  Filled 2020-07-25: qty 30

## 2020-07-25 MED ORDER — STERILE WATER FOR INJECTION IJ SOLN
INTRAMUSCULAR | Status: DC | PRN
  Administered 2020-07-25: 10 mL

## 2020-07-25 MED ORDER — HEMOSTATIC AGENTS (NO CHARGE) OPTIME
TOPICAL | Status: DC | PRN
  Administered 2020-07-25: 1 via TOPICAL

## 2020-07-25 MED ORDER — ONDANSETRON HCL 4 MG/2ML IJ SOLN
4.0000 mg | Freq: Four times a day (QID) | INTRAMUSCULAR | Status: DC | PRN
  Administered 2020-07-28 – 2020-07-29 (×2): 4 mg via INTRAVENOUS
  Filled 2020-07-25 (×2): qty 2

## 2020-07-25 MED ORDER — PLASMA-LYTE 148 IV SOLN
INTRAVENOUS | Status: DC
  Filled 2020-07-25: qty 2.5

## 2020-07-25 MED ORDER — PLATELET RICH PLASMA OPTIME
Status: DC | PRN
  Administered 2020-07-25: 10 mL

## 2020-07-25 MED ORDER — THROMBIN 5000 UNITS EX SOLR
INTRAVENOUS | Status: DC | PRN
  Administered 2020-07-25: 2 mL

## 2020-07-25 MED ORDER — DEXMEDETOMIDINE HCL IN NACL 400 MCG/100ML IV SOLN
0.0000 ug/kg/h | INTRAVENOUS | Status: DC
  Administered 2020-07-25 (×2): 0.7 ug/kg/h via INTRAVENOUS
  Administered 2020-07-25: 1 ug/kg/h via INTRAVENOUS
  Administered 2020-07-26 – 2020-07-27 (×7): 0.7 ug/kg/h via INTRAVENOUS
  Administered 2020-07-29: 0.6 ug/kg/h via INTRAVENOUS
  Administered 2020-07-29: 0.4 ug/kg/h via INTRAVENOUS
  Administered 2020-07-29: 0.6 ug/kg/h via INTRAVENOUS
  Administered 2020-07-29: 0.7 ug/kg/h via INTRAVENOUS
  Filled 2020-07-25: qty 200
  Filled 2020-07-25 (×12): qty 100

## 2020-07-25 MED ORDER — TRANEXAMIC ACID (OHS) BOLUS VIA INFUSION
15.0000 mg/kg | INTRAVENOUS | Status: DC
  Filled 2020-07-25: qty 1631

## 2020-07-25 MED ORDER — NOREPINEPHRINE 4 MG/250ML-% IV SOLN
0.0000 ug/min | INTRAVENOUS | Status: DC
  Administered 2020-07-26: 2 ug/min via INTRAVENOUS
  Filled 2020-07-25: qty 250

## 2020-07-25 MED ORDER — MAGNESIUM SULFATE 50 % IJ SOLN
40.0000 meq | INTRAMUSCULAR | Status: DC
  Filled 2020-07-25: qty 9.85

## 2020-07-25 MED ORDER — ROCURONIUM BROMIDE 10 MG/ML (PF) SYRINGE
PREFILLED_SYRINGE | INTRAVENOUS | Status: DC | PRN
  Administered 2020-07-25 (×5): 50 mg via INTRAVENOUS

## 2020-07-25 MED ORDER — DEXMEDETOMIDINE HCL IN NACL 400 MCG/100ML IV SOLN
0.1000 ug/kg/h | INTRAVENOUS | Status: DC
  Filled 2020-07-25: qty 100

## 2020-07-25 MED ORDER — VANCOMYCIN HCL IN DEXTROSE 1-5 GM/200ML-% IV SOLN
1000.0000 mg | Freq: Once | INTRAVENOUS | Status: AC
  Administered 2020-07-25: 1000 mg via INTRAVENOUS
  Filled 2020-07-25: qty 200

## 2020-07-25 MED ORDER — PROTAMINE SULFATE 10 MG/ML IV SOLN
INTRAVENOUS | Status: AC
  Filled 2020-07-25: qty 15

## 2020-07-25 MED ORDER — SODIUM CHLORIDE 0.9 % IV SOLN: INTRAVENOUS | Status: DC

## 2020-07-25 MED ORDER — ETOMIDATE 2 MG/ML IV SOLN
INTRAVENOUS | Status: AC
  Filled 2020-07-25: qty 10

## 2020-07-25 MED ORDER — AMIODARONE HCL IN DEXTROSE 360-4.14 MG/200ML-% IV SOLN
60.0000 mg/h | INTRAVENOUS | Status: DC
  Filled 2020-07-25: qty 200

## 2020-07-25 MED ORDER — SODIUM CHLORIDE 0.9 % IV SOLN
250.0000 mL | INTRAVENOUS | Status: DC
  Administered 2020-07-27: 250 mL via INTRAVENOUS

## 2020-07-25 MED ORDER — SODIUM CHLORIDE 0.9% IV SOLUTION: Freq: Once | INTRAVENOUS | Status: DC

## 2020-07-25 MED ORDER — FENTANYL CITRATE (PF) 250 MCG/5ML IJ SOLN
INTRAMUSCULAR | Status: DC | PRN
  Administered 2020-07-25: 250 ug via INTRAVENOUS
  Administered 2020-07-25: 100 ug via INTRAVENOUS
  Administered 2020-07-25: 50 ug via INTRAVENOUS
  Administered 2020-07-25: 25 ug via INTRAVENOUS
  Administered 2020-07-25: 100 ug via INTRAVENOUS
  Administered 2020-07-25: 150 ug via INTRAVENOUS
  Administered 2020-07-25: 175 ug via INTRAVENOUS

## 2020-07-25 MED ORDER — SODIUM CHLORIDE 0.9 % IV SOLN
1.5000 g | INTRAVENOUS | Status: DC
  Filled 2020-07-25: qty 1.5

## 2020-07-25 MED ORDER — PHENYLEPHRINE 40 MCG/ML (10ML) SYRINGE FOR IV PUSH (FOR BLOOD PRESSURE SUPPORT)
PREFILLED_SYRINGE | INTRAVENOUS | Status: DC | PRN
  Administered 2020-07-25 (×5): 80 ug via INTRAVENOUS

## 2020-07-25 MED ORDER — PHENYLEPHRINE HCL-NACL 20-0.9 MG/250ML-% IV SOLN
30.0000 ug/min | INTRAVENOUS | Status: DC
  Filled 2020-07-25: qty 250

## 2020-07-25 MED ORDER — DOCUSATE SODIUM 100 MG PO CAPS: 200.0000 mg | ORAL_CAPSULE | Freq: Every day | ORAL | Status: DC

## 2020-07-25 MED ORDER — METOPROLOL TARTRATE 12.5 MG HALF TABLET: 12.5000 mg | ORAL_TABLET | Freq: Two times a day (BID) | ORAL | Status: DC

## 2020-07-25 MED ORDER — INSULIN REGULAR(HUMAN) IN NACL 100-0.9 UT/100ML-% IV SOLN
INTRAVENOUS | Status: DC
  Filled 2020-07-25: qty 100

## 2020-07-25 MED ORDER — POTASSIUM CHLORIDE 10 MEQ/50ML IV SOLN
10.0000 meq | INTRAVENOUS | Status: AC
  Administered 2020-07-25 (×3): 10 meq via INTRAVENOUS

## 2020-07-25 MED ORDER — ACETAMINOPHEN 160 MG/5ML PO SOLN: 650.0000 mg | Freq: Once | ORAL | Status: AC

## 2020-07-25 MED ORDER — MILRINONE LACTATE IN DEXTROSE 20-5 MG/100ML-% IV SOLN
0.3000 ug/kg/min | INTRAVENOUS | Status: AC
  Administered 2020-07-25: .375 ug/kg/min via INTRAVENOUS
  Filled 2020-07-25: qty 100

## 2020-07-25 MED ORDER — POTASSIUM CHLORIDE 10 MEQ/50ML IV SOLN: 10.0000 meq | INTRAVENOUS | Status: AC

## 2020-07-25 MED ORDER — INSULIN REGULAR(HUMAN) IN NACL 100-0.9 UT/100ML-% IV SOLN
INTRAVENOUS | Status: DC
  Administered 2020-07-25 (×2): 5.5 [IU]/h via INTRAVENOUS
  Filled 2020-07-25: qty 100

## 2020-07-25 MED ORDER — TRANEXAMIC ACID (OHS) PUMP PRIME SOLUTION
2.0000 mg/kg | INTRAVENOUS | Status: DC
  Filled 2020-07-25: qty 2.17

## 2020-07-25 MED ORDER — ROCURONIUM BROMIDE 10 MG/ML (PF) SYRINGE
PREFILLED_SYRINGE | INTRAVENOUS | Status: AC
  Filled 2020-07-25: qty 20

## 2020-07-25 MED ORDER — SODIUM CHLORIDE 0.45 % IV SOLN: INTRAVENOUS | Status: DC | PRN

## 2020-07-25 MED ORDER — EPINEPHRINE HCL 5 MG/250ML IV SOLN IN NS
0.0000 ug/min | INTRAVENOUS | Status: AC
Start: 2020-07-25 — End: 2020-07-25
  Administered 2020-07-25: 6 ug/min via INTRAVENOUS
  Filled 2020-07-25: qty 250

## 2020-07-25 MED ORDER — PROPOFOL 10 MG/ML IV BOLUS
INTRAVENOUS | Status: AC
  Filled 2020-07-25: qty 20

## 2020-07-25 MED ORDER — HEMOSTATIC AGENTS (NO CHARGE) OPTIME
TOPICAL | Status: DC | PRN
  Administered 2020-07-25 (×2): 1 via TOPICAL

## 2020-07-25 MED ORDER — SUCCINYLCHOLINE CHLORIDE 200 MG/10ML IV SOSY
PREFILLED_SYRINGE | INTRAVENOUS | Status: DC | PRN
  Administered 2020-07-25: 100 mg via INTRAVENOUS

## 2020-07-25 MED ORDER — PLASMA-LYTE 148 IV SOLN
INTRAVENOUS | Status: DC | PRN
  Administered 2020-07-25: 500 mL via INTRAVASCULAR

## 2020-07-25 MED ORDER — MIDAZOLAM HCL (PF) 10 MG/2ML IJ SOLN
INTRAMUSCULAR | Status: AC
  Filled 2020-07-25: qty 2

## 2020-07-25 MED ORDER — CALCIUM CHLORIDE 10 % IV SOLN
INTRAVENOUS | Status: AC
  Filled 2020-07-25: qty 20

## 2020-07-25 MED ORDER — MIDAZOLAM HCL 2 MG/2ML IJ SOLN
2.0000 mg | INTRAMUSCULAR | Status: DC | PRN
  Administered 2020-07-25 – 2020-07-26 (×7): 2 mg via INTRAVENOUS
  Filled 2020-07-25 (×6): qty 2

## 2020-07-25 MED ORDER — AMIODARONE IV BOLUS ONLY 150 MG/100ML
INTRAVENOUS | Status: DC | PRN
  Administered 2020-07-25: 150 mg via INTRAVENOUS

## 2020-07-25 MED ORDER — AMIODARONE HCL IN DEXTROSE 360-4.14 MG/200ML-% IV SOLN
INTRAVENOUS | Status: AC
  Administered 2020-07-25: 30 mg/h
  Filled 2020-07-25: qty 200

## 2020-07-25 MED ORDER — AMIODARONE HCL IN DEXTROSE 360-4.14 MG/200ML-% IV SOLN
30.0000 mg/h | INTRAVENOUS | Status: DC
  Administered 2020-07-26 – 2020-07-27 (×4): 30 mg/h via INTRAVENOUS
  Filled 2020-07-25 (×7): qty 200

## 2020-07-25 MED ORDER — BISACODYL 5 MG PO TBEC
10.0000 mg | DELAYED_RELEASE_TABLET | Freq: Every day | ORAL | Status: DC
  Administered 2020-07-27 – 2020-08-09 (×7): 10 mg via ORAL
  Filled 2020-07-25 (×9): qty 2

## 2020-07-25 SURGICAL SUPPLY — 66 items
ADH SKN CLS APL DERMABOND .7 (GAUZE/BANDAGES/DRESSINGS) ×1
AGENT HMST 10 BLLW SHRT CANN (HEMOSTASIS) ×1
BAG DECANTER FOR FLEXI CONT (MISCELLANEOUS) ×3 IMPLANT
BNDG ELASTIC 4X5.8 VLCR STR LF (GAUZE/BANDAGES/DRESSINGS) ×2 IMPLANT
BNDG ELASTIC 6X5.8 VLCR STR LF (GAUZE/BANDAGES/DRESSINGS) ×2 IMPLANT
CANISTER SUCT 3000ML PPV (MISCELLANEOUS) ×2 IMPLANT
CATH CPB KIT HENDRICKSON (MISCELLANEOUS) ×2 IMPLANT
CATH ROBINSON RED A/P 18FR (CATHETERS) ×4 IMPLANT
DERMABOND ADVANCED (GAUZE/BANDAGES/DRESSINGS) ×1
DERMABOND ADVANCED .7 DNX12 (GAUZE/BANDAGES/DRESSINGS) ×1 IMPLANT
DRAIN CHANNEL 28F RND 3/8 FF (WOUND CARE) ×6 IMPLANT
DRAPE CARDIOVASCULAR INCISE (DRAPES) ×2
DRAPE SLUSH/WARMER DISC (DRAPES) ×2 IMPLANT
DRAPE SRG 135X102X78XABS (DRAPES) ×1 IMPLANT
DRSG AQUACEL AG ADV 3.5X14 (GAUZE/BANDAGES/DRESSINGS) ×2 IMPLANT
DRSG TEGADERM 4X4.5 CHG (GAUZE/BANDAGES/DRESSINGS) ×1 IMPLANT
ELECT CAUTERY BLADE 6.4 (BLADE) ×2 IMPLANT
ELECT REM PT RETURN 9FT ADLT (ELECTROSURGICAL) ×4
ELECTRODE REM PT RTRN 9FT ADLT (ELECTROSURGICAL) ×2 IMPLANT
FELT TEFLON 1X6 (MISCELLANEOUS) ×4 IMPLANT
GAUZE SPONGE 4X4 12PLY STRL (GAUZE/BANDAGES/DRESSINGS) ×3 IMPLANT
GLOVE NEODERM STRL 7.5  LF PF (GLOVE) ×3
GLOVE NEODERM STRL 7.5 LF PF (GLOVE) ×3 IMPLANT
GLOVE SURG NEODERM 7.5  LF PF (GLOVE) ×3
GOWN STRL REUS W/ TWL LRG LVL3 (GOWN DISPOSABLE) ×4 IMPLANT
GOWN STRL REUS W/TWL LRG LVL3 (GOWN DISPOSABLE) ×8
HEMOSTAT HEMOBLAST BELLOWS (HEMOSTASIS) ×1 IMPLANT
HEMOSTAT POWDER SURGIFOAM 1G (HEMOSTASIS) ×5 IMPLANT
HEMOSTAT SURGICEL 2X14 (HEMOSTASIS) ×2 IMPLANT
INSERT SUTURE HOLDER (MISCELLANEOUS) ×2 IMPLANT
KIT BASIN OR (CUSTOM PROCEDURE TRAY) ×2 IMPLANT
KIT SUCTION CATH 14FR (SUCTIONS) ×2 IMPLANT
KIT TURNOVER KIT B (KITS) ×2 IMPLANT
NS IRRIG 1000ML POUR BTL (IV SOLUTION) ×10 IMPLANT
PACK E OPEN HEART (SUTURE) ×2 IMPLANT
PACK OPEN HEART (CUSTOM PROCEDURE TRAY) ×2 IMPLANT
PAD ARMBOARD 7.5X6 YLW CONV (MISCELLANEOUS) ×4 IMPLANT
PAD ELECT DEFIB RADIOL ZOLL (MISCELLANEOUS) ×2 IMPLANT
POSITIONER HEAD DONUT 9IN (MISCELLANEOUS) ×2 IMPLANT
POWDER SURGICEL 3.0 GRAM (HEMOSTASIS) ×1 IMPLANT
SEALANT PATCH FIBRIN 2X4IN (MISCELLANEOUS) ×1 IMPLANT
SUT BONE WAX W31G (SUTURE) ×2 IMPLANT
SUT MNCRL AB 3-0 PS2 18 (SUTURE) ×5 IMPLANT
SUT PDS AB 1 CTX 36 (SUTURE) ×6 IMPLANT
SUT PROLENE 3 0 SH DA (SUTURE) ×2 IMPLANT
SUT PROLENE 5 0 C 1 36 (SUTURE) IMPLANT
SUT PROLENE 6 0 C 1 30 (SUTURE) ×6 IMPLANT
SUT PROLENE 7 0 BV 1 (SUTURE) ×3 IMPLANT
SUT PROLENE 8 0 BV175 6 (SUTURE) IMPLANT
SUT PROLENE BLUE 7 0 (SUTURE) ×2 IMPLANT
SUT SILK 2 0 SH CR/8 (SUTURE) IMPLANT
SUT SILK 3 0 SH CR/8 (SUTURE) IMPLANT
SUT STEEL 6MS V (SUTURE) ×2 IMPLANT
SUT STEEL SZ 6 DBL 3X14 BALL (SUTURE) ×2 IMPLANT
SUT VIC AB 2-0 CTX 27 (SUTURE) IMPLANT
SUT VIC AB 3-0 X1 27 (SUTURE) IMPLANT
SYSTEM SAHARA CHEST DRAIN ATS (WOUND CARE) ×2 IMPLANT
TAPE CLOTH SURG 4X10 WHT LF (GAUZE/BANDAGES/DRESSINGS) ×1 IMPLANT
TAPE PAPER 2X10 WHT MICROPORE (GAUZE/BANDAGES/DRESSINGS) ×1 IMPLANT
TOWEL GREEN STERILE (TOWEL DISPOSABLE) ×2 IMPLANT
TOWEL GREEN STERILE FF (TOWEL DISPOSABLE) ×2 IMPLANT
TRAY FOLEY SLVR 16FR TEMP STAT (SET/KITS/TRAYS/PACK) ×2 IMPLANT
UNDERPAD 30X36 HEAVY ABSORB (UNDERPADS AND DIAPERS) ×2 IMPLANT
WATER STERILE IRR 1000ML POUR (IV SOLUTION) ×4 IMPLANT
WATER STERILE IRR 1000ML UROMA (IV SOLUTION) IMPLANT
YANKAUER SUCT BULB TIP NO VENT (SUCTIONS) ×1 IMPLANT

## 2020-07-25 SURGICAL SUPPLY — 137 items
ADAPTER CARDIO PERF ANTE/RETRO (ADAPTER) ×3 IMPLANT
ADH SKN CLS APL DERMABOND .7 (GAUZE/BANDAGES/DRESSINGS) ×6
ADPR PRFSN 84XANTGRD RTRGD (ADAPTER) ×2
ADPR TBG 2 MALE LL ART (MISCELLANEOUS) ×2
ANCHOR CATH FOLEY SECURE (MISCELLANEOUS) ×3 IMPLANT
APL SRG 7X2 LUM MLBL SLNT (VASCULAR PRODUCTS)
APPLICATOR TIP COSEAL (VASCULAR PRODUCTS) IMPLANT
BAG DECANTER FOR FLEXI CONT (MISCELLANEOUS) ×3 IMPLANT
BALLN IABP SENSA PLUS 8F 50CC (BALLOONS) ×3
BALLOON IABP SENS PLUS 8F 50CC (BALLOONS) IMPLANT
BIOPATCH WHT 1IN DISK W/4.0 H (GAUZE/BANDAGES/DRESSINGS) ×2 IMPLANT
BLADE CLIPPER SURG (BLADE) ×3 IMPLANT
BLADE STERNUM SYSTEM 6 (BLADE) ×3 IMPLANT
BLADE SURG 11 STRL SS (BLADE) ×1 IMPLANT
BNDG ELASTIC 4X5.8 VLCR STR LF (GAUZE/BANDAGES/DRESSINGS) ×3 IMPLANT
BNDG ELASTIC 6X5.8 VLCR STR LF (GAUZE/BANDAGES/DRESSINGS) ×3 IMPLANT
BNDG GAUZE ELAST 4 BULKY (GAUZE/BANDAGES/DRESSINGS) ×3 IMPLANT
CANISTER SUCT 3000ML PPV (MISCELLANEOUS) ×3 IMPLANT
CATH ACCU-VU SIZ PIG 5F 100CM (CATHETERS) ×1 IMPLANT
CATH CPB KIT HENDRICKSON (MISCELLANEOUS) ×3 IMPLANT
CATH RETROPLEGIA CORONARY 14FR (CATHETERS) ×1 IMPLANT
CATH ROBINSON RED A/P 18FR (CATHETERS) ×6 IMPLANT
CLIP RETRACTION 3.0MM CORONARY (MISCELLANEOUS) ×3 IMPLANT
CLIP VESOCCLUDE LG 6/CT (CLIP) ×1 IMPLANT
CLIP VESOCCLUDE MED 24/CT (CLIP) ×1 IMPLANT
CLIP VESOCCLUDE SM WIDE 24/CT (CLIP) ×9 IMPLANT
CONN ST 1/4X3/8  BEN (MISCELLANEOUS) ×9
CONN ST 1/4X3/8 BEN (MISCELLANEOUS) IMPLANT
CONTAINER PROTECT SURGISLUSH (MISCELLANEOUS) ×2 IMPLANT
COVER PROBE W GEL 5X96 (DRAPES) ×2 IMPLANT
DEFOGGER ANTIFOG KIT (MISCELLANEOUS) ×1 IMPLANT
DERMABOND ADVANCED (GAUZE/BANDAGES/DRESSINGS) ×3
DERMABOND ADVANCED .7 DNX12 (GAUZE/BANDAGES/DRESSINGS) ×2 IMPLANT
DRAIN CHANNEL 28F RND 3/8 FF (WOUND CARE) ×9 IMPLANT
DRAPE C-ARM 42X72 X-RAY (DRAPES) ×1 IMPLANT
DRAPE CARDIOVASCULAR INCISE (DRAPES) ×3
DRAPE SLUSH/WARMER DISC (DRAPES) ×3 IMPLANT
DRAPE SPACE STATION 30X60X34 (DRAPES) ×1 IMPLANT
DRAPE SRG 135X102X78XABS (DRAPES) ×2 IMPLANT
DRSG AQUACEL AG ADV 3.5X 6 (GAUZE/BANDAGES/DRESSINGS) ×1 IMPLANT
DRSG AQUACEL AG ADV 3.5X14 (GAUZE/BANDAGES/DRESSINGS) ×3 IMPLANT
DRSG SORBAVIEW 3.5X5-5/16 MED (GAUZE/BANDAGES/DRESSINGS) ×2 IMPLANT
DRSG TEGADERM 4X4.5 CHG (GAUZE/BANDAGES/DRESSINGS) ×1 IMPLANT
ELECT CAUTERY BLADE 6.4 (BLADE) ×3 IMPLANT
ELECT REM PT RETURN 9FT ADLT (ELECTROSURGICAL) ×6
ELECTRODE REM PT RTRN 9FT ADLT (ELECTROSURGICAL) ×4 IMPLANT
FELT TEFLON 1X6 (MISCELLANEOUS) ×5 IMPLANT
GAUZE SPONGE 4X4 12PLY STRL (GAUZE/BANDAGES/DRESSINGS) ×6 IMPLANT
GAUZE SPONGE 4X4 12PLY STRL LF (GAUZE/BANDAGES/DRESSINGS) ×3 IMPLANT
GLOVE NEODERM STRL 7.5  LF PF (GLOVE) ×6
GLOVE NEODERM STRL 7.5 LF PF (GLOVE) ×6 IMPLANT
GLOVE SURG NEODERM 7.5  LF PF (GLOVE) ×3
GOWN STRL REUS W/ TWL LRG LVL3 (GOWN DISPOSABLE) ×8 IMPLANT
GOWN STRL REUS W/TWL LRG LVL3 (GOWN DISPOSABLE) ×27
GRAFT HEMASHIELD 10MM (Graft) ×3 IMPLANT
GRAFT VASC STRG 30X10STRL (Graft) IMPLANT
HEMOSTAT POWDER SURGIFOAM 1G (HEMOSTASIS) ×4 IMPLANT
INSERT FOGARTY SM (MISCELLANEOUS) ×1 IMPLANT
INSERT FOGARTY XLG (MISCELLANEOUS) ×4 IMPLANT
INSERT SUTURE HOLDER (MISCELLANEOUS) ×3 IMPLANT
IV ADAPTER SYR DOUBLE MALE LL (MISCELLANEOUS) ×1 IMPLANT
KIT APPLICATOR RATIO 11:1 (KITS) ×1 IMPLANT
KIT BASIN OR (CUSTOM PROCEDURE TRAY) ×3 IMPLANT
KIT SUCTION CATH 14FR (SUCTIONS) ×3 IMPLANT
KIT TURNOVER KIT B (KITS) ×3 IMPLANT
KIT VASOVIEW HEMOPRO 2 VH 4000 (KITS) ×3 IMPLANT
LOOP VESSEL MAXI BLUE (MISCELLANEOUS) ×1 IMPLANT
MARKER GRAFT CORONARY BYPASS (MISCELLANEOUS) ×8 IMPLANT
NDL 18GX1X1/2 (RX/OR ONLY) (NEEDLE) ×2 IMPLANT
NDL HYPO 18GX1.5 BLUNT FILL (NEEDLE) IMPLANT
NEEDLE 18GX1X1/2 (RX/OR ONLY) (NEEDLE) ×3 IMPLANT
NEEDLE HYPO 18GX1.5 BLUNT FILL (NEEDLE) ×3 IMPLANT
NS IRRIG 1000ML POUR BTL (IV SOLUTION) ×14 IMPLANT
PACK E OPEN HEART (SUTURE) ×3 IMPLANT
PACK OPEN HEART (CUSTOM PROCEDURE TRAY) ×3 IMPLANT
PACK PLATELET PROCEDURE 60 (MISCELLANEOUS) ×1 IMPLANT
PACK SPY-PHI (KITS) ×1 IMPLANT
PAD ARMBOARD 7.5X6 YLW CONV (MISCELLANEOUS) ×6 IMPLANT
PAD ELECT DEFIB RADIOL ZOLL (MISCELLANEOUS) ×3 IMPLANT
PENCIL BUTTON HOLSTER BLD 10FT (ELECTRODE) ×3 IMPLANT
POSITIONER HEAD DONUT 9IN (MISCELLANEOUS) ×3 IMPLANT
POWDER SURGICEL 3.0 GRAM (HEMOSTASIS) ×3 IMPLANT
PUMP SET IMPELLA 5.5 US (CATHETERS) ×1 IMPLANT
PUNCH AORTIC ROTATE 5MM 8IN (MISCELLANEOUS) ×1 IMPLANT
SEALANT SURG COSEAL 4ML (VASCULAR PRODUCTS) ×3 IMPLANT
SEALANT SURG COSEAL 8ML (VASCULAR PRODUCTS) ×1 IMPLANT
SET CARDIOPLEGIA MPS 5001102 (MISCELLANEOUS) ×1 IMPLANT
SPONGE LAP 18X18 RF (DISPOSABLE) ×1 IMPLANT
SUT BONE WAX W31G (SUTURE) ×3 IMPLANT
SUT MNCRL AB 3-0 PS2 18 (SUTURE) ×6 IMPLANT
SUT MNCRL AB 4-0 PS2 18 (SUTURE) ×3 IMPLANT
SUT PDS AB 1 CTX 36 (SUTURE) ×6 IMPLANT
SUT PROLENE 3 0 SH DA (SUTURE) ×3 IMPLANT
SUT PROLENE 4 0 RB 1 (SUTURE) ×3
SUT PROLENE 4 0 SH DA (SUTURE) ×1 IMPLANT
SUT PROLENE 4-0 RB1 .5 CRCL 36 (SUTURE) IMPLANT
SUT PROLENE 5 0 C 1 36 (SUTURE) ×2 IMPLANT
SUT PROLENE 5 0 C1 (SUTURE) ×2 IMPLANT
SUT PROLENE 6 0 C 1 30 (SUTURE) ×11 IMPLANT
SUT PROLENE 7 0 BV 1 (SUTURE) ×2 IMPLANT
SUT PROLENE 7 0 BV1 MDA (SUTURE) ×1 IMPLANT
SUT PROLENE 8 0 BV175 6 (SUTURE) IMPLANT
SUT PROLENE BLUE 7 0 (SUTURE) ×3 IMPLANT
SUT SILK  1 MH (SUTURE) ×6
SUT SILK 1 MH (SUTURE) IMPLANT
SUT SILK 2 0 SH CR/8 (SUTURE) IMPLANT
SUT SILK 3 0 SH CR/8 (SUTURE) IMPLANT
SUT STEEL 6MS V (SUTURE) ×3 IMPLANT
SUT STEEL STERNAL CCS#1 18IN (SUTURE) ×1 IMPLANT
SUT STEEL SZ 6 DBL 3X14 BALL (SUTURE) ×4 IMPLANT
SUT VIC AB 2-0 CT1 27 (SUTURE) ×3
SUT VIC AB 2-0 CT1 TAPERPNT 27 (SUTURE) IMPLANT
SUT VIC AB 2-0 CTX 27 (SUTURE) IMPLANT
SUT VIC AB 3-0 X1 27 (SUTURE) IMPLANT
SYR 10ML LL (SYRINGE) IMPLANT
SYR 20ML LL LF (SYRINGE) ×1 IMPLANT
SYR 30ML LL (SYRINGE) ×2 IMPLANT
SYR 3ML LL SCALE MARK (SYRINGE) ×2 IMPLANT
SYSTEM SAHARA CHEST DRAIN ATS (WOUND CARE) ×3 IMPLANT
TAPE CLOTH SURG 4X10 WHT LF (GAUZE/BANDAGES/DRESSINGS) ×2 IMPLANT
TAPE PAPER 2X10 WHT MICROPORE (GAUZE/BANDAGES/DRESSINGS) ×1 IMPLANT
TIP DUAL SPRAY TOPICAL (TIP) ×2 IMPLANT
TOWEL GREEN STERILE (TOWEL DISPOSABLE) ×3 IMPLANT
TOWEL GREEN STERILE FF (TOWEL DISPOSABLE) ×3 IMPLANT
TRAY FOLEY SLVR 16FR TEMP STAT (SET/KITS/TRAYS/PACK) ×3 IMPLANT
TUBE CONNECTING 12X1/4 (SUCTIONS) ×1 IMPLANT
TUBE SUCT INTRACARD DLP 20F (MISCELLANEOUS) ×1 IMPLANT
TUBING ART PRESS 48 MALE/FEM (TUBING) ×2 IMPLANT
TUBING ART PRESS 72  MALE/FEM (TUBING) ×6
TUBING ART PRESS 72 MALE/FEM (TUBING) IMPLANT
TUBING LAP HI FLOW INSUFFLATIO (TUBING) ×3 IMPLANT
UNDERPAD 30X36 HEAVY ABSORB (UNDERPADS AND DIAPERS) ×3 IMPLANT
WATER STERILE IRR 1000ML POUR (IV SOLUTION) ×6 IMPLANT
WATER STERILE IRR 1000ML UROMA (IV SOLUTION) IMPLANT
WIRE EMERALD 3MM-J .035X260CM (WIRE) ×1 IMPLANT
WIRE G V18X300CM (WIRE) ×1 IMPLANT
YANKAUER SUCT BULB TIP NO VENT (SUCTIONS) ×1 IMPLANT

## 2020-07-25 SURGICAL SUPPLY — 59 items
ADH SKN CLS APL DERMABOND .7 (GAUZE/BANDAGES/DRESSINGS)
BAG DECANTER FOR FLEXI CONT (MISCELLANEOUS) ×1 IMPLANT
BNDG ELASTIC 4X5.8 VLCR STR LF (GAUZE/BANDAGES/DRESSINGS) ×1 IMPLANT
BNDG ELASTIC 6X5.8 VLCR STR LF (GAUZE/BANDAGES/DRESSINGS) ×1 IMPLANT
CANISTER SUCT 3000ML PPV (MISCELLANEOUS) ×1 IMPLANT
CATH CPB KIT HENDRICKSON (MISCELLANEOUS) ×1 IMPLANT
CATH ROBINSON RED A/P 18FR (CATHETERS) ×2 IMPLANT
DERMABOND ADVANCED (GAUZE/BANDAGES/DRESSINGS)
DERMABOND ADVANCED .7 DNX12 (GAUZE/BANDAGES/DRESSINGS) ×1 IMPLANT
DRAIN CHANNEL 28F RND 3/8 FF (WOUND CARE) ×3 IMPLANT
DRAPE CARDIOVASCULAR INCISE (DRAPES)
DRAPE CHEST BREAST 15X10 FENES (DRAPES) ×1 IMPLANT
DRAPE SLUSH/WARMER DISC (DRAPES) ×1 IMPLANT
DRAPE SRG 135X102X78XABS (DRAPES) ×1 IMPLANT
DRSG AQUACEL AG ADV 3.5X14 (GAUZE/BANDAGES/DRESSINGS) ×1 IMPLANT
ELECT CAUTERY BLADE 6.4 (BLADE) ×1 IMPLANT
ELECT REM PT RETURN 9FT ADLT (ELECTROSURGICAL)
ELECTRODE REM PT RTRN 9FT ADLT (ELECTROSURGICAL) ×2 IMPLANT
FELT TEFLON 1X6 (MISCELLANEOUS) ×2 IMPLANT
GAUZE SPONGE 4X4 12PLY STRL (GAUZE/BANDAGES/DRESSINGS) ×2 IMPLANT
GLOVE NEODERM STRL 7.5  LF PF (GLOVE)
GLOVE NEODERM STRL 7.5 LF PF (GLOVE) ×3 IMPLANT
GLOVE SURG NEODERM 7.5  LF PF (GLOVE)
GOWN STRL REUS W/ TWL LRG LVL3 (GOWN DISPOSABLE) ×4 IMPLANT
GOWN STRL REUS W/TWL LRG LVL3 (GOWN DISPOSABLE) ×6
HEMOSTAT POWDER SURGIFOAM 1G (HEMOSTASIS) ×3 IMPLANT
HEMOSTAT SURGICEL 2X14 (HEMOSTASIS) ×1 IMPLANT
INSERT SUTURE HOLDER (MISCELLANEOUS) ×1 IMPLANT
KIT BASIN OR (CUSTOM PROCEDURE TRAY) ×1 IMPLANT
KIT SUCTION CATH 14FR (SUCTIONS) ×1 IMPLANT
KIT TURNOVER KIT B (KITS) ×1 IMPLANT
NS IRRIG 1000ML POUR BTL (IV SOLUTION) ×6 IMPLANT
PACK E OPEN HEART (SUTURE) ×1 IMPLANT
PACK OPEN HEART (CUSTOM PROCEDURE TRAY) ×1 IMPLANT
PAD ARMBOARD 7.5X6 YLW CONV (MISCELLANEOUS) ×2 IMPLANT
PAD ELECT DEFIB RADIOL ZOLL (MISCELLANEOUS) ×1 IMPLANT
POSITIONER HEAD DONUT 9IN (MISCELLANEOUS) ×1 IMPLANT
SPONGE LAP 18X18 X RAY DECT (DISPOSABLE) ×1 IMPLANT
SUT BONE WAX W31G (SUTURE) ×1 IMPLANT
SUT MNCRL AB 3-0 PS2 18 (SUTURE) ×2 IMPLANT
SUT PDS AB 1 CTX 36 (SUTURE) ×2 IMPLANT
SUT PROLENE 3 0 SH DA (SUTURE) ×1 IMPLANT
SUT PROLENE 5 0 C 1 36 (SUTURE) IMPLANT
SUT PROLENE 6 0 C 1 30 (SUTURE) ×3 IMPLANT
SUT PROLENE 8 0 BV175 6 (SUTURE) IMPLANT
SUT PROLENE BLUE 7 0 (SUTURE) ×1 IMPLANT
SUT SILK 2 0 SH CR/8 (SUTURE) IMPLANT
SUT SILK 3 0 SH CR/8 (SUTURE) IMPLANT
SUT STEEL 6MS V (SUTURE) ×1 IMPLANT
SUT STEEL SZ 6 DBL 3X14 BALL (SUTURE) ×1 IMPLANT
SUT VIC AB 2-0 CTX 27 (SUTURE) IMPLANT
SUT VIC AB 3-0 X1 27 (SUTURE) IMPLANT
SYSTEM SAHARA CHEST DRAIN ATS (WOUND CARE) ×1 IMPLANT
TOWEL GREEN STERILE (TOWEL DISPOSABLE) ×1 IMPLANT
TOWEL GREEN STERILE FF (TOWEL DISPOSABLE) ×1 IMPLANT
TRAY FOLEY SLVR 16FR TEMP STAT (SET/KITS/TRAYS/PACK) ×1 IMPLANT
UNDERPAD 30X36 HEAVY ABSORB (UNDERPADS AND DIAPERS) ×1 IMPLANT
WATER STERILE IRR 1000ML POUR (IV SOLUTION) ×2 IMPLANT
WATER STERILE IRR 1000ML UROMA (IV SOLUTION) IMPLANT

## 2020-07-25 NOTE — Addendum Note (Signed)
Addendum  created 07/18/2020 1944 by Rande Brunt, CRNA   Intraprocedure Event edited

## 2020-07-25 NOTE — Anesthesia Procedure Notes (Signed)
Arterial Line Insertion Start/End04/05/2020 8:20 AM, 07/26/2020 8:25 AM Performed by: Rande Brunt, CRNA  Patient location: OR. Preanesthetic checklist: patient identified, IV checked, site marked, risks and benefits discussed, surgical consent, monitors and equipment checked, pre-op evaluation, timeout performed and anesthesia consent Right, radial was placed Catheter size: 20 G Hand hygiene performed  and maximum sterile barriers used   Attempts: 1 Procedure performed without using ultrasound guided technique. Following insertion, dressing applied and Biopatch. Post procedure assessment: normal and unchanged  Patient tolerated the procedure well with no immediate complications.

## 2020-07-25 NOTE — H&P (Signed)
History and Physical Interval Note:  07/15/2020 7:45 AM  Jp Schuerger  has presented today for surgery, with the diagnosis of CAD.  The various methods of treatment have been discussed with the patient and family. After consideration of risks, benefits and other options for treatment, the patient has consented to  Procedure(s): CORONARY ARTERY BYPASS GRAFTING (CABG) (N/A) TRANSESOPHAGEAL ECHOCARDIOGRAM (TEE) (N/A) INDOCYANINE GREEN FLUORESCENCE IMAGING (ICG) (N/A) as a surgical intervention.  The patient's history has been reviewed, patient examined, no change in status, stable for surgery.  I have reviewed the patient's chart and labs.  Questions were answered to the patient's satisfaction.     Wonda Olds  Will plan to cannulate the right axillary artery due to extensive ascending aortic calcification. Will need bilateral arterial lines.

## 2020-07-25 NOTE — Anesthesia Preprocedure Evaluation (Signed)
Anesthesia Evaluation  Patient identified by MRN, date of birth, ID bandPreop documentation limited or incomplete due to emergent nature of procedure.  Airway        Dental   Pulmonary former smoker,           Cardiovascular hypertension,      Neuro/Psych    GI/Hepatic   Endo/Other  diabetes  Renal/GU      Musculoskeletal   Abdominal   Peds  Hematology   Anesthesia Other Findings   Reproductive/Obstetrics                             Anesthesia Physical Anesthesia Plan  ASA: V and emergent  Anesthesia Plan: General   Post-op Pain Management:    Induction: Inhalational  PONV Risk Score and Plan: Treatment may vary due to age or medical condition  Airway Management Planned: Oral ETT  Additional Equipment: Arterial line, CVP, PA Cath and TEE  Intra-op Plan:   Post-operative Plan: Post-operative intubation/ventilation  Informed Consent: I have reviewed the patients History and Physical, chart, labs and discussed the procedure including the risks, benefits and alternatives for the proposed anesthesia with the patient or authorized representative who has indicated his/her understanding and acceptance.       Plan Discussed with:   Anesthesia Plan Comments:         Anesthesia Quick Evaluation

## 2020-07-25 NOTE — Progress Notes (Signed)
RT called to pt's room to do a recruitment due to pt's Sp02 in the 57's. Recruitment done for about 3 minutes.  PC 26 PEEP: 10 RR 10 Ti:3.0 FiO2: 100%.  Pt tolerated well, Sp02 increased to 100%.  Recruitment discontinued and pt placed back on regular full vent settings.

## 2020-07-25 NOTE — Progress Notes (Signed)
Code [employer] d/t patient with no flow on Impella.  CPR started.  Please see Code Sheet.  Dr. Orvan Seen called and made aware of Code and is on the way back to the ICU.

## 2020-07-25 NOTE — OR Nursing (Signed)
2 lap sponges removed from chest cavity.

## 2020-07-25 NOTE — Anesthesia Procedure Notes (Signed)
Central Venous Catheter Insertion Performed by: Suzette Battiest, MD, anesthesiologist Start/End04/09/2020 6:45 AM, 07/13/2020 7:00 AM Patient location: Pre-op. Preanesthetic checklist: patient identified, IV checked, site marked, risks and benefits discussed, surgical consent, monitors and equipment checked, pre-op evaluation, timeout performed and anesthesia consent Position: Trendelenburg Lidocaine 1% used for infiltration and patient sedated Hand hygiene performed , maximum sterile barriers used  and Seldinger technique used Catheter size: 9 Fr Total catheter length 10. Central line and PA cath was placed.MAC introducer Swan type:thermodilution PA Cath depth:50 Procedure performed using ultrasound guided technique. Ultrasound Notes:anatomy identified, needle tip was noted to be adjacent to the nerve/plexus identified, no ultrasound evidence of intravascular and/or intraneural injection and image(s) printed for medical record Attempts: 1 Following insertion, line sutured, dressing applied and Biopatch. Post procedure assessment: blood return through all ports, free fluid flow and no air  Patient tolerated the procedure well with no immediate complications.

## 2020-07-25 NOTE — Progress Notes (Signed)
1U PRBC's given emergently d/t patient bleeding and post code.  Unit number [id number] M 1U PRBC's given emergently d/t patient bleeding and post code.  Unit number [id number] A 1U PRBC's given emergently d/t patient bleeding and post code.  Unit number [id number] Y 1U PRBC's given emergently d/t patient bleeding and post code.  Unit number [id number].

## 2020-07-25 NOTE — Progress Notes (Addendum)
Patient rolled in from [place] with CRNA Junie Bame at the bedside. Patient with 450cc Chest tube drainage in Pleurovac.  Labs drawn and sent down to lab. Placed on monitor and continue to monitor chest drainage.  1610: Dr. Orvan Seen paged concerning Chest tube drainage.  Dr. Cyndia Bent made aware of Chest tube drainage when we were talking to him and received an order for PRBC's until Dr. Orvan Seen is able to call us back.

## 2020-07-25 NOTE — Progress Notes (Signed)
Progress Note  Patient Name: Danny Carpenter Date of Encounter: 07/06/2020  Primary Cardiologist:   No primary care provider on file.   Subjective   Going for CABG nervous but relieved not to wait   Inpatient Medications    Scheduled Meds: . [MAR Hold] aspirin EC  81 mg Oral Daily  . bisacodyl  5 mg Oral Once  . [MAR Hold] carvedilol  6.25 mg Oral BID WC  . [MAR Hold] DULoxetine  30 mg Oral Daily  . [MAR Hold] enoxaparin (LOVENOX) injection  40 mg Subcutaneous Q24H  . epinephrine  0-10 mcg/min Intravenous To OR  . [MAR Hold] furosemide  40 mg Oral Daily  . [MAR Hold] gabapentin  600 mg Oral TID  . heparin-papaverine-plasmalyte irrigation   Irrigation To OR  . [MAR Hold] insulin aspart  0-15 Units Subcutaneous TID WC  . [MAR Hold] insulin aspart  0-5 Units Subcutaneous QHS  . [MAR Hold] insulin glargine  40 Units Subcutaneous QHS  . insulin   Intravenous To OR  . magnesium sulfate  40 mEq Other To OR  . [MAR Hold] pantoprazole  40 mg Oral QAC breakfast  . phenylephrine  30-200 mcg/min Intravenous To OR  . potassium chloride  80 mEq Other To OR  . [MAR Hold] rosuvastatin  40 mg Oral Daily  . [MAR Hold] sacubitril-valsartan  1 tablet Oral BID  . [MAR Hold] sodium chloride flush  3 mL Intravenous Q12H  . [MAR Hold] sodium chloride flush  3 mL Intravenous Q12H  . [MAR Hold] spironolactone  12.5 mg Oral Daily  . tranexamic acid  15 mg/kg Intravenous To OR  . tranexamic acid  2 mg/kg Intracatheter To OR   Continuous Infusions: . [MAR Hold] sodium chloride    . cefUROXime (ZINACEF)  IV    . cefUROXime (ZINACEF)  IV    . dexmedetomidine    . heparin 30,000 units/NS 1000 mL solution for CELLSAVER    . milrinone    . nitroGLYCERIN    . norepinephrine    . tranexamic acid (CYKLOKAPRON) infusion ([facility])    . vancomycin     PRN Meds: [MAR Hold] sodium chloride, [MAR Hold] acetaminophen, [MAR Hold] methocarbamol, [MAR Hold] nitroGLYCERIN, [MAR Hold] ondansetron (ZOFRAN) IV, [MAR  Hold] sodium chloride flush, [MAR Hold] traMADol   Vital Signs    Vitals:   07/24/20 0827 07/24/20 1150 07/24/20 2137 07/27/2020 0522  BP:  122/74 (!) 150/82 104/61  Pulse:  84 91 73  Resp: '16 16 18 20  '$ Temp: 98.3 F (36.8 C)  98.4 F (36.9 C) 98.7 F (37.1 C)  TempSrc: Oral  Oral Oral  SpO2:  96% 98% 96%  Weight:    108.7 kg  Height:        Intake/Output Summary (Last 24 hours) at 07/13/2020 [id number] Last data filed at 07/01/2020 0519 Gross per 24 hour  Intake --  Output 2200 ml  Net -2200 ml   Filed Weights   07/23/20 0602 07/24/20 0304 07/23/2020 0522  Weight: 107.6 kg 109.6 kg 108.7 kg    Telemetry    NSR, 07/10/2020   ECG    07/22/20 SR rate 73 PVC nonspecific ST changes   Physical Exam   Affect appropriate Healthy:  appears stated age HEENT: normal Neck supple with no adenopathy JVP normal no bruits no thyromegaly Lungs clear with no wheezing and good diaphragmatic motion Heart:  S1/S2 no murmur, no rub, gallop or click PMI normal Abdomen: benighn, BS positve, no tenderness,  no AAA no bruit.  No HSM or HJR Distal pulses intact with no bruits No edema Neuro non-focal Skin warm and dry Left foot amputation    Labs    Chemistry Recent Labs  Lab 07/23/20 0133 07/24/20 0322 07/24/2020 0542  NA 130* 131* 134*  K 4.1 4.3 4.2  CL 97* 98 98  CO2 '25 25 27  '$ GLUCOSE 351* 244* 133*  BUN 30* 25* 21  CREATININE 1.33* 1.08 0.98  CALCIUM 8.9 9.0 9.3  GFRNONAA 58* >60 >60  ANIONGAP '8 8 9     '$ Hematology Recent Labs  Lab 07/23/20 0133 07/24/20 0322 07/04/2020 0542  WBC 5.1 5.7 5.8  RBC 4.24 4.27 4.49  HGB 11.6* 12.1* 12.4*  HCT 36.4* 37.0* 38.6*  MCV 85.8 86.7 86.0  MCH 27.4 28.3 27.6  MCHC 31.9 32.7 32.1  RDW 14.7 14.7 14.8  PLT 239 249 249    Cardiac EnzymesNo results for input(s): TROPONINI in the last 168 hours. No results for input(s): TROPIPOC in the last 168 hours.   BNP Recent Labs  Lab 07/03/2020 1211  BNP 465.5*     DDimer No results  for input(s): DDIMER in the last 168 hours.   Radiology    VAS US DOPPLER PRE CABG  Result Date: 07/24/2020 PREOPERATIVE VASCULAR EVALUATION Patient Name:  Danny Carpenter  Date of Exam:   07/24/2020 Medical Rec #: [id number]      Accession #:    [id number] Date of Birth: 07-Jan-1951     Patient Gender: M Patient Age:   69Y Exam Location:  [facility] Procedure:      VAS US DOPPLER PRE CABG Referring Phys: [id number] [place] --------------------------------------------------------------------------------  Indications:            Pre-CABG. Risk Factors:           Hypertension, hyperlipidemia, Diabetes, past history of                         smoking, PAD. Other Factors:          Left transmetatarsal amputation 08/06/2019. Vascular Interventions: 01/27/2019- left femoral artery and profunda femoral                         artery endarterectomy, left femoral-popliteal artery                         bypass graft. Comparison Study:       ABI 05/13/2019- right ABI/TBI= 0.73/0.69, left ABI/TBI=                         0.89/0.38 Performing Technologist: Maudry Mayhew MHA, RVT, RDCS, RDMS  Examination Guidelines: A complete evaluation includes B-mode imaging, spectral Doppler, color Doppler, and power Doppler as needed of all accessible portions of each vessel. Bilateral testing is considered an integral part of a complete examination. Limited examinations for reoccurring indications may be performed as noted.  Right Carotid Findings: +----------+--------+--------+--------+------------------------------+--------+           PSV cm/sEDV cm/sStenosisDescribe                      Comments +----------+--------+--------+--------+------------------------------+--------+ CCA Prox  53      6               smooth and heterogenous                +----------+--------+--------+--------+------------------------------+--------+  CCA Distal42      9               focal, smooth and heterogenous          +----------+--------+--------+--------+------------------------------+--------+ ICA Prox  67      29      1-39%                                          +----------+--------+--------+--------+------------------------------+--------+ ICA Distal84      33                                            tortuous +----------+--------+--------+--------+------------------------------+--------+ ECA       38                      smooth and heterogenous                +----------+--------+--------+--------+------------------------------+--------+ Portions of this table do not appear on this page. +----------+--------+-------+----------------+------------+           PSV cm/sEDV cmsDescribe        Arm Pressure +----------+--------+-------+----------------+------------+ [id number]             Multiphasic, WNL             +----------+--------+-------+----------------+------------+ +---------+--------+--+--------+--+---------+ VertebralPSV cm/s51EDV cm/s11Antegrade +---------+--------+--+--------+--+---------+ Left Carotid Findings: +----------+--------+--------+--------+------------------------------+--------+           PSV cm/sEDV cm/sStenosisDescribe                      Comments +----------+--------+--------+--------+------------------------------+--------+ CCA Prox  79      16                                                     +----------+--------+--------+--------+------------------------------+--------+ CCA Distal69      16              focal, smooth and heterogenous         +----------+--------+--------+--------+------------------------------+--------+ ICA Prox  146     44      40-59%  heterogenous and calcific              +----------+--------+--------+--------+------------------------------+--------+ ICA Distal100     28                                                      +----------+--------+--------+--------+------------------------------+--------+ ECA       53      5               heterogenous and calcific              +----------+--------+--------+--------+------------------------------+--------+ +----------+--------+--------+----------------+------------+ SubclavianPSV cm/sEDV cm/sDescribe        Arm Pressure +----------+--------+--------+----------------+------------+           153             Multiphasic, WNL             +----------+--------+--------+----------------+------------+ +---------+--------+--+--------+--+---------+ VertebralPSV cm/s39EDV cm/s11Antegrade +---------+--------+--+--------+--+---------+  ABI Findings: +---------+------------------+-----+----------+--------+ Right  Rt Pressure (mmHg)IndexWaveform  Comment  +---------+------------------+-----+----------+--------+ Brachial 126                    triphasic          +---------+------------------+-----+----------+--------+ PTA      106               0.84 monophasic         +---------+------------------+-----+----------+--------+ DP       98                0.78 monophasic         +---------+------------------+-----+----------+--------+ Richmond Campbell               1.09                    +---------+------------------+-----+----------+--------+ +---------+------------------+-----+----------+--------------------------------+ Left     Lt Pressure (mmHg)IndexWaveform  Comment                          +---------+------------------+-----+----------+--------------------------------+ Brachial 120                    triphasic                                  +---------+------------------+-----+----------+--------------------------------+ PTA      128               1.02 monophasic                                 +---------+------------------+-----+----------+--------------------------------+ DP       112               0.89 monophasic                                  +---------+------------------+-----+----------+--------------------------------+ Great Toe                                 Unable to obtain pressure due to                                           amputation                       +---------+------------------+-----+----------+--------------------------------+ +-------+---------------+----------------+ ABI/TBIToday's ABI/TBIPrevious ABI/TBI +-------+---------------+----------------+ Right  0.84/1.09      0.73/0.69        +-------+---------------+----------------+ Left   1.02/-         0.89/0.38        +-------+---------------+----------------+  Right Doppler Findings: +-----------+--------+-----+---------+-----------------------------------------+ Site       PressureIndexDoppler  Comments                                  +-----------+--------+-----+---------+-----------------------------------------+ Brachial   126          triphasic                                          +-----------+--------+-----+---------+-----------------------------------------+  Radial                  triphasic                                          +-----------+--------+-----+---------+-----------------------------------------+ Ulnar                   triphasic                                          +-----------+--------+-----+---------+-----------------------------------------+ Palmar Arch                      Signal obliterates with radial                                             compression, is unaffected with ulnar                                      compression                               +-----------+--------+-----+---------+-----------------------------------------+  Left Doppler Findings: +-----------+--------+-----+---------+-----------------------------------------+ Site       PressureIndexDoppler  Comments                                   +-----------+--------+-----+---------+-----------------------------------------+ Brachial   120          triphasic                                          +-----------+--------+-----+---------+-----------------------------------------+ Radial                  triphasic                                          +-----------+--------+-----+---------+-----------------------------------------+ Ulnar                   triphasic                                          +-----------+--------+-----+---------+-----------------------------------------+ Palmar Arch                      Signal is unaffected with radial                                           compression, decreases 50% with ulnar  compression.                              +-----------+--------+-----+---------+-----------------------------------------+  Summary: Right Carotid: Velocities in the right ICA are consistent with a 1-39% stenosis. Left Carotid: Velocities in the left ICA are consistent with a 40-59% stenosis. Vertebrals:  Bilateral vertebral arteries demonstrate antegrade flow. Subclavians: Normal flow hemodynamics were seen in bilateral subclavian              arteries. Right ABI: Resting right ankle-brachial index indicates mild right lower extremity arterial disease. The right toe-brachial index is normal. Left ABI: Resting left ankle-brachial index is within normal range. No evidence of significant left lower extremity arterial disease. Right Upper Extremity: No significant arterial obstruction detected in the right upper extremity. Left Upper Extremity: No significant arterial obstruction detected in the left upper extremity.  Electronically signed by Ruta Hinds MD on 07/24/2020 at 4:12:13 PM.    Final     Cardiac Studies   Echo:  Prelim.  Poor windows.  EF 25% RV mildly reduced mild MR no AS   Patient Profile     70 y.o. male with a hx of HTN, HLD, uncontrolled  T2DM, CVA (remote lacunar infarct left cerebellum 10//18/20 MRI), AAA, Left foot ampuation, PAD s/p fem pop bypass, and heart failure  who is being seen for the evaluation of chest pain and shortness of breath   Assessment & Plan    NSTEMI: peak troponin only 172 cath 4/22 with severe 3 vessel disease and LM Last dose plavix 4/22 pre cabg doppler ok may need CHF team help post op with severe LV dysfunction hopefully viable myocardium and improvement in EF post op. Will need EP evaluation 3 months post op for AICD possibility   CHF EF 25% PCWP 25 mmHg at cath good diuresis with resolution of dyspnea  Post op coreg/entresto and diuresis   HLD:  LDL  86 on 07/19/2020.  crestor 40 mg daily   DM:  AIC 11.6.  Appreciate diabetes coordinator recs, continue Lantus 40 units daily and NovoLog SSI.  Plan  Jardiance prior to discharge  Hypertension: being Rx with CHF meds improved     For questions or updates, please contact [facility] Please consult [email or web address] for contact info under Cardiology/STEMI.   Signed, Jenkins Rouge, MD  07/12/2020, 6:52 AM

## 2020-07-25 NOTE — Anesthesia Postprocedure Evaluation (Signed)
Anesthesia Post Note  Patient: Danny Carpenter  Procedure(s) Performed: EXPLORATION POST OPERATIVE OPEN HEART (N/A Chest)     Patient location during evaluation: SICU Anesthesia Type: General Level of consciousness: sedated Pain management: pain level controlled Vital Signs Assessment: post-procedure vital signs reviewed and stable Respiratory status: patient remains intubated per anesthesia plan Cardiovascular status: stable Postop Assessment: no apparent nausea or vomiting Anesthetic complications: no   No complications documented.  Last Vitals:  Vitals:   07/19/2020 1700 07/12/2020 1715  BP:    Pulse: 89 89  Resp: 16 16  Temp: (!) 35.6 C (!) 35.7 C  SpO2: 100% 99%    Last Pain:  Vitals:   07/17/2020 0522  TempSrc: Oral  PainSc:                  Akash Winski,W. EDMOND

## 2020-07-25 NOTE — Brief Op Note (Signed)
07/23/2020 - 07/02/2020  2:24 PM  PATIENT:  Danny Carpenter  70 y.o. male  PRE-OPERATIVE DIAGNOSIS:  CORONARY ARTERY DISEASE  POST-OPERATIVE DIAGNOSIS:  CORONARY ARTERY DISEASE  PROCEDURE:  Procedure(s): CORONARY ARTERY BYPASS GRAFTING (CABG) ON PUMP X FIVE  USING LEFT INTERNAL MAMMARY ARTERY AND RIGHT ENDOSCOPIC VEIN HARVEST CONDUITS, INSERTION OF INTRA AORTIC BALLOON PUMP IN RIGHT FEMORAL ARTERY,  RIGHT AXILLARY CANNULATION WITH 10MM HEMASHIELD GRAFT (N/A) TRANSESOPHAGEAL ECHOCARDIOGRAM (TEE) (N/A) INDOCYANINE GREEN FLUORESCENCE IMAGING (ICG) (N/A)   Saphenous vein harvest time: 54 minutes, prep: 10 minutes   SVG toDiag 1 and Ramus SVG to PDA and PL LIMA to LAD   SURGEON:  Surgeon(s) and Role:    * Wonda Olds, MD - Primary  PHYSICIAN ASSISTANT:   Nicholes Rough, PA-C   ANESTHESIA:   general   BLOOD ADMINISTERED:none  DRAINS: ROUTINE   LOCAL MEDICATIONS USED:  NONE  SPECIMEN:  No Specimen  DISPOSITION OF SPECIMEN:  N/A  COUNTS:  YES  DICTATION: .Dragon Dictation  PLAN OF CARE: Admit to inpatient   PATIENT DISPOSITION:  ICU - intubated and hemodynamically stable.   Delay start of Pharmacological VTE agent (>24hrs) due to surgical blood loss or risk of bleeding: yes

## 2020-07-25 NOTE — Anesthesia Postprocedure Evaluation (Signed)
Anesthesia Post Note  Patient: Danny Carpenter  Procedure(s) Performed: CORONARY ARTERY BYPASS GRAFTING (CABG) ON PUMP X FIVE  USING LEFT INTERNAL MAMMARY ARTERY AND RIGHT ENDOSCOPIC VEIN HARVEST CONDUITS, INSERTION OF INTRA AORTIC BALLOON PUMP IN RIGHT FEMORAL ARTERY,  RIGHT AXILLARY CANNULATION WITH 10MM HEMASHIELD GRAFT (N/A Chest) TRANSESOPHAGEAL ECHOCARDIOGRAM (TEE) (N/A ) INDOCYANINE GREEN FLUORESCENCE IMAGING (ICG) (N/A )     Patient location during evaluation: SICU Anesthesia Type: General Level of consciousness: sedated Vital Signs Assessment: vitals unstable Respiratory status: patient remains intubated per anesthesia plan Cardiovascular status: unstable Postop Assessment: no apparent nausea or vomiting Anesthetic complications: no Comments: Pt bleeding into chest tubes and hemodynamically unstable shortly after arrival to ICU. Responsive to blood products, but hypotensive with stopping fluids. Presumed coagoulopathy or surgical bleeding with tamponade. [place] brought and blood, saline and albumin transfused. Surgeon cracked chest at bedside and lots of blood in mediastinum and pleural space. Once HD stable pt brought back to OR for further exploration and correction of coagulopathy.  Deatra Canter, MD   No complications documented.  Last Vitals:  Vitals:   07/06/2020 1700 07/01/2020 1715  BP:    Pulse: 89 89  Resp: 16 16  Temp: (!) 35.6 C (!) 35.7 C  SpO2: 100% 99%    Last Pain:  Vitals:   07/29/2020 0522  TempSrc: Oral  PainSc:                  Tiajuana Amass

## 2020-07-25 NOTE — Brief Op Note (Signed)
07/26/2020  7:29 PM  PATIENT:  Danny Carpenter  70 y.o. male  PRE-OPERATIVE DIAGNOSIS:  BLEEDING s/p CABG  POST-OPERATIVE DIAGNOSIS:  same  PROCEDURE:  Procedure(s): EXPLORATION POST OPERATIVE OPEN HEART (N/A)  SURGEON:  Surgeon(s) and Role:    * Wonda Olds, MD - Primary  PHYSICIAN ASSISTANT: E. Barrett, PA-C  ASSISTANTS: staff   ANESTHESIA:   general    BLOOD ADMINISTERED; per anes record  DRAINS: 3 Bard drains   LOCAL MEDICATIONS USED:  NONE  SPECIMEN:  No Specimen  DISPOSITION OF SPECIMEN:  N/A  COUNTS:  YES  TOURNIQUET:  * No tourniquets in log *  DICTATION: .Note written in EPIC  PLAN OF CARE: Admit to inpatient   PATIENT DISPOSITION:  ICU - intubated and critically ill.   Delay start of Pharmacological VTE agent (>24hrs) due to surgical blood loss or risk of bleeding: yes

## 2020-07-25 NOTE — Progress Notes (Signed)
This chaplain arrived with the medical team for a Pt. Code Blue. The chaplain understands from RN-Cybil the Pt. just arrived on the unit from the Cath Lab with family in the waiting room.  This chaplain updated the On Call Chaplain-Kaye on the critical event.

## 2020-07-25 NOTE — Transfer of Care (Signed)
Immediate Anesthesia Transfer of Care Note  Patient: Danny Carpenter  Procedure(s) Performed: EXPLORATION POST OPERATIVE OPEN HEART (N/A Chest)  Patient Location: SICU  Anesthesia Type:General  Level of Consciousness: Patient remains intubated per anesthesia plan  Airway & Oxygen Therapy: Patient remains intubated per anesthesia plan and Patient placed on Ventilator (see vital sign flow sheet for setting)  Post-op Assessment: Report given to RN  Post vital signs: Reviewed and stable  Last Vitals:  Vitals Value Taken Time  BP 165/76   Temp 35 C 07/24/2020 1931  Pulse 74 07/06/2020 1931  Resp 20 07/03/2020 1931  SpO2 93 % 07/17/2020 1931  Vitals shown include unvalidated device data.  Last Pain:  Vitals:   07/26/2020 0522  TempSrc: Oral  PainSc:       Patients Stated Pain Goal: 2 ([id number])  Complications: No complications documented.

## 2020-07-25 NOTE — Progress Notes (Signed)
RT note-patient taken back to the OR on ventilator.

## 2020-07-25 NOTE — Anesthesia Procedure Notes (Signed)
Arterial Line Insertion Start/End04/05/2020 6:50 AM, 07/14/2020 7:00 AM Performed by: Rande Brunt, CRNA, CRNA  Patient location: Pre-op. Preanesthetic checklist: patient identified, IV checked, site marked, risks and benefits discussed, surgical consent, monitors and equipment checked, pre-op evaluation, timeout performed and anesthesia consent Lidocaine 1% used for infiltration Left, radial was placed Catheter size: 20 G Hand hygiene performed  and maximum sterile barriers used  Allen's test indicative of satisfactory collateral circulation Attempts: 1 Procedure performed without using ultrasound guided technique. Following insertion, dressing applied and Biopatch. Post procedure assessment: normal and unchanged  Patient tolerated the procedure well with no immediate complications.

## 2020-07-25 NOTE — Anesthesia Procedure Notes (Signed)
Procedure Name: Intubation Date/Time: 07/12/2020 8:19 AM Performed by: Rande Brunt, CRNA Pre-anesthesia Checklist: Patient identified, Emergency Drugs available, Suction available and Patient being monitored Patient Re-evaluated:Patient Re-evaluated prior to induction Oxygen Delivery Method: Circle System Utilized Preoxygenation: Pre-oxygenation with 100% oxygen Induction Type: IV induction Ventilation: Mask ventilation without difficulty Laryngoscope Size: Mac and 4 Grade View: Grade I Tube type: Oral Tube size: 7.5 mm Number of attempts: 1 Airway Equipment and Method: Stylet and Oral airway Placement Confirmation: ETT inserted through vocal cords under direct vision,  positive ETCO2 and breath sounds checked- equal and bilateral Secured at: 23 cm Tube secured with: Tape Dental Injury: Teeth and Oropharynx as per pre-operative assessment

## 2020-07-25 NOTE — Progress Notes (Signed)
Chaplain responded to Code Blue and went to patient's room. Patient was intubated and taken back to OR because of bleeding. Chaplain went to outer waiting room and spoke with patient's son and daughter and invited them to wait in [place] waiting room and nursing staff would let them know how their father was. Chaplain did not inform them of Code Blue or that patient was being taken back to surgery. Son and Bonney Roussel had a conversation about being a Clinical biochemist as he is a Theme park manager. Chaplain made small talk so that they would not be anxious that something might be seriously wrong with their father. Nurses came into the waiting room and gave them an update on patient. They were concerned but both nurses were calm and that reflected to them. Chaplain received a call and informed them that she would check on them later. If they need chaplain before then, they can notify nurse and she will call for Chaplain.    07/01/2020 1655  Clinical Encounter Type  Visited With Health care provider  Visit Type Code  Referral From Chaplain  Consult/Referral To Nurse  Spiritual Encounters  Spiritual Needs Prayer

## 2020-07-25 NOTE — Anesthesia Procedure Notes (Signed)
Central Venous Catheter Insertion Performed by: Suzette Battiest, MD, anesthesiologist Start/End04/29/2022 6:45 AM, 07/07/2020 7:00 AM Patient location: Pre-op. Preanesthetic checklist: patient identified, IV checked, site marked, risks and benefits discussed, surgical consent, monitors and equipment checked, pre-op evaluation, timeout performed and anesthesia consent Hand hygiene performed  and maximum sterile barriers used  PA cath was placed.Swan type:thermodilution Procedure performed without using ultrasound guided technique. Attempts: 1 Patient tolerated the procedure well with no immediate complications.

## 2020-07-25 NOTE — Transfer of Care (Signed)
Immediate Anesthesia Transfer of Care Note  Patient: Danny Carpenter  Procedure(s) Performed: CORONARY ARTERY BYPASS GRAFTING (CABG) ON PUMP X FIVE  USING LEFT INTERNAL MAMMARY ARTERY AND RIGHT ENDOSCOPIC VEIN HARVEST CONDUITS, INSERTION OF INTRA AORTIC BALLOON PUMP IN RIGHT FEMORAL ARTERY,  RIGHT AXILLARY CANNULATION WITH 10MM HEMASHIELD GRAFT (N/A Chest) TRANSESOPHAGEAL ECHOCARDIOGRAM (TEE) (N/A ) INDOCYANINE GREEN FLUORESCENCE IMAGING (ICG) (N/A )  Patient Location: SICU  Anesthesia Type:General  Level of Consciousness: Patient remains intubated per anesthesia plan  Airway & Oxygen Therapy: Patient remains intubated per anesthesia plan and Patient placed on Ventilator (see vital sign flow sheet for setting)  Post-op Assessment: Report given to RN   Post vital signs: Reviewed and stable  Last Vitals:  Vitals Value Taken Time  BP 115/89   Temp 36 C 07/12/2020 1621  Pulse 88 07/29/2020 1621  Resp 16 07/28/2020 1621  SpO2 98 % 07/27/2020 1621  Vitals shown include unvalidated device data.  Last Pain:  Vitals:   07/04/2020 0522  TempSrc: Oral  PainSc:       Patients Stated Pain Goal: 2 ([id number])  Complications: No complications documented.

## 2020-07-26 ENCOUNTER — Inpatient Hospital Stay (HOSPITAL_COMMUNITY): Payer: No Typology Code available for payment source

## 2020-07-26 ENCOUNTER — Encounter (HOSPITAL_COMMUNITY): Payer: Self-pay | Admitting: Anesthesiology

## 2020-07-26 ENCOUNTER — Encounter (HOSPITAL_COMMUNITY): Payer: Self-pay | Admitting: Cardiothoracic Surgery

## 2020-07-26 DIAGNOSIS — Z951 Presence of aortocoronary bypass graft: Secondary | ICD-10-CM | POA: Diagnosis not present

## 2020-07-26 DIAGNOSIS — I5023 Acute on chronic systolic (congestive) heart failure: Secondary | ICD-10-CM | POA: Diagnosis not present

## 2020-07-26 DIAGNOSIS — I214 Non-ST elevation (NSTEMI) myocardial infarction: Secondary | ICD-10-CM | POA: Diagnosis not present

## 2020-07-26 DIAGNOSIS — R570 Cardiogenic shock: Secondary | ICD-10-CM | POA: Diagnosis not present

## 2020-07-26 LAB — PREPARE FRESH FROZEN PLASMA
Unit division: 0
Unit division: 0
Unit division: 0
Unit division: 0

## 2020-07-26 LAB — BPAM CRYOPRECIPITATE
Blood Product Expiration Date: 202204262310
Blood Product Expiration Date: 202204262310
ISSUE DATE / TIME: 202204261726
ISSUE DATE / TIME: 202204261726
Unit Type and Rh: 6200
Unit Type and Rh: 6200

## 2020-07-26 LAB — BPAM PLATELET PHERESIS
Blood Product Expiration Date: 202204282359
Blood Product Expiration Date: 202204282359
Blood Product Expiration Date: 202204292359
Blood Product Expiration Date: 202204292359
ISSUE DATE / TIME: 202204261717
ISSUE DATE / TIME: 202204261717
ISSUE DATE / TIME: 202204261717
ISSUE DATE / TIME: 202204261806
Unit Type and Rh: 2800
Unit Type and Rh: 5100
Unit Type and Rh: 6200
Unit Type and Rh: 6200

## 2020-07-26 LAB — GLUCOSE, CAPILLARY
Glucose-Capillary: 100 mg/dL — ABNORMAL HIGH (ref 70–99)
Glucose-Capillary: 102 mg/dL — ABNORMAL HIGH (ref 70–99)
Glucose-Capillary: 103 mg/dL — ABNORMAL HIGH (ref 70–99)
Glucose-Capillary: 104 mg/dL — ABNORMAL HIGH (ref 70–99)
Glucose-Capillary: 105 mg/dL — ABNORMAL HIGH (ref 70–99)
Glucose-Capillary: 107 mg/dL — ABNORMAL HIGH (ref 70–99)
Glucose-Capillary: 107 mg/dL — ABNORMAL HIGH (ref 70–99)
Glucose-Capillary: 111 mg/dL — ABNORMAL HIGH (ref 70–99)
Glucose-Capillary: 112 mg/dL — ABNORMAL HIGH (ref 70–99)
Glucose-Capillary: 114 mg/dL — ABNORMAL HIGH (ref 70–99)
Glucose-Capillary: 117 mg/dL — ABNORMAL HIGH (ref 70–99)
Glucose-Capillary: 117 mg/dL — ABNORMAL HIGH (ref 70–99)
Glucose-Capillary: 118 mg/dL — ABNORMAL HIGH (ref 70–99)
Glucose-Capillary: 119 mg/dL — ABNORMAL HIGH (ref 70–99)
Glucose-Capillary: 120 mg/dL — ABNORMAL HIGH (ref 70–99)
Glucose-Capillary: 120 mg/dL — ABNORMAL HIGH (ref 70–99)
Glucose-Capillary: 120 mg/dL — ABNORMAL HIGH (ref 70–99)
Glucose-Capillary: 121 mg/dL — ABNORMAL HIGH (ref 70–99)
Glucose-Capillary: 128 mg/dL — ABNORMAL HIGH (ref 70–99)
Glucose-Capillary: 95 mg/dL (ref 70–99)
Glucose-Capillary: 97 mg/dL (ref 70–99)

## 2020-07-26 LAB — BASIC METABOLIC PANEL
Anion gap: 9 (ref 5–15)
BUN: 18 mg/dL (ref 8–23)
CO2: 24 mmol/L (ref 22–32)
Calcium: 8.7 mg/dL — ABNORMAL LOW (ref 8.9–10.3)
Chloride: 105 mmol/L (ref 98–111)
Creatinine, Ser: 0.99 mg/dL (ref 0.61–1.24)
GFR, Estimated: >60 mL/min (ref >60)
Glucose, Bld: 99 mg/dL (ref 70–99)
Potassium: 4.6 mmol/L (ref 3.5–5.1)
Sodium: 138 mmol/L (ref 135–145)

## 2020-07-26 LAB — PREPARE PLATELET PHERESIS
Unit division: 0
Unit division: 0
Unit division: 0
Unit division: 0

## 2020-07-26 LAB — BPAM FFP
Blood Product Expiration Date: 202204292359
Blood Product Expiration Date: 202204302359
Blood Product Expiration Date: 202205012359
Blood Product Expiration Date: 202205012359
Blood Product Expiration Date: 202205012359
Blood Product Expiration Date: 202205012359
Blood Product Expiration Date: 202205012359
Blood Product Expiration Date: 202205012359
ISSUE DATE / TIME: 202204261711
ISSUE DATE / TIME: 202204261711
ISSUE DATE / TIME: 202204261711
ISSUE DATE / TIME: 202204261711
ISSUE DATE / TIME: 202204261742
ISSUE DATE / TIME: 202204261742
ISSUE DATE / TIME: 202204261742
ISSUE DATE / TIME: 202204261742
Unit Type and Rh: 6200
Unit Type and Rh: 6200
Unit Type and Rh: 6200
Unit Type and Rh: 6200
Unit Type and Rh: 6200
Unit Type and Rh: 6200
Unit Type and Rh: 6200
Unit Type and Rh: 6200

## 2020-07-26 LAB — COMPREHENSIVE METABOLIC PANEL
ALT: 13 U/L (ref 0–44)
AST: 26 U/L (ref 15–41)
Albumin: 3.4 g/dL — ABNORMAL LOW (ref 3.5–5.0)
Alkaline Phosphatase: 28 U/L — ABNORMAL LOW (ref 38–126)
Anion gap: 9 (ref 5–15)
BUN: 18 mg/dL (ref 8–23)
CO2: 24 mmol/L (ref 22–32)
Calcium: 8.1 mg/dL — ABNORMAL LOW (ref 8.9–10.3)
Chloride: 106 mmol/L (ref 98–111)
Creatinine, Ser: 1.15 mg/dL (ref 0.61–1.24)
GFR, Estimated: >60 mL/min (ref >60)
Glucose, Bld: 113 mg/dL — ABNORMAL HIGH (ref 70–99)
Potassium: 4.2 mmol/L (ref 3.5–5.1)
Sodium: 139 mmol/L (ref 135–145)
Total Bilirubin: 1.2 mg/dL (ref 0.3–1.2)
Total Protein: 4.9 g/dL — ABNORMAL LOW (ref 6.5–8.1)

## 2020-07-26 LAB — POCT I-STAT 7, (LYTES, BLD GAS, ICA,H+H)
Acid-base deficit: 3 mmol/L — ABNORMAL HIGH (ref 0.0–2.0)
Bicarbonate: 22.5 mmol/L (ref 20.0–28.0)
Calcium, Ion: 1.31 mmol/L (ref 1.15–1.40)
HCT: 30 % — ABNORMAL LOW (ref 39.0–52.0)
Hemoglobin: 10.2 g/dL — ABNORMAL LOW (ref 13.0–17.0)
O2 Saturation: 94 %
Patient temperature: 37.5
Potassium: 4.6 mmol/L (ref 3.5–5.1)
Sodium: 139 mmol/L (ref 135–145)
TCO2: 24 mmol/L (ref 22–32)
pCO2 arterial: 44 mmHg (ref 32.0–48.0)
pH, Arterial: 7.319 — ABNORMAL LOW (ref 7.350–7.450)
pO2, Arterial: 80 mmHg — ABNORMAL LOW (ref 83.0–108.0)

## 2020-07-26 LAB — PREPARE CRYOPRECIPITATE
Unit division: 0
Unit division: 0

## 2020-07-26 LAB — CBC
HCT: 29.7 % — ABNORMAL LOW (ref 39.0–52.0)
HCT: 28.1 % — ABNORMAL LOW (ref 39.0–52.0)
Hemoglobin: 10.1 g/dL — ABNORMAL LOW (ref 13.0–17.0)
Hemoglobin: 9.5 g/dL — ABNORMAL LOW (ref 13.0–17.0)
MCH: 29.2 pg (ref 26.0–34.0)
MCH: 29.1 pg (ref 26.0–34.0)
MCHC: 34 g/dL (ref 30.0–36.0)
MCHC: 33.8 g/dL (ref 30.0–36.0)
MCV: 85.8 fL (ref 80.0–100.0)
MCV: 86.2 fL (ref 80.0–100.0)
Platelets: 110 10*3/uL — ABNORMAL LOW (ref 150–400)
Platelets: 104 10*3/uL — ABNORMAL LOW (ref 150–400)
RBC: 3.46 MIL/uL — ABNORMAL LOW (ref 4.22–5.81)
RBC: 3.26 MIL/uL — ABNORMAL LOW (ref 4.22–5.81)
RDW: 14.4 % (ref 11.5–15.5)
RDW: 15.3 % (ref 11.5–15.5)
WBC: 5 10*3/uL (ref 4.0–10.5)
WBC: 7 10*3/uL (ref 4.0–10.5)
nRBC: 0 % (ref 0.0–0.2)
nRBC: 0 % (ref 0.0–0.2)

## 2020-07-26 LAB — ECHOCARDIOGRAM LIMITED
Height: 73 in
Weight: 4479.75 oz

## 2020-07-26 LAB — MAGNESIUM
Magnesium: 2.2 mg/dL (ref 1.7–2.4)
Magnesium: 1.9 mg/dL (ref 1.7–2.4)

## 2020-07-26 LAB — COOXEMETRY PANEL
Carboxyhemoglobin: 1 % (ref 0.5–1.5)
Methemoglobin: 0.9 % (ref 0.0–1.5)
O2 Saturation: 51.7 %
Total hemoglobin: 9.6 g/dL — ABNORMAL LOW (ref 12.0–16.0)

## 2020-07-26 LAB — HEPARIN LEVEL (UNFRACTIONATED): Heparin Unfractionated: <0.1 IU/mL — ABNORMAL LOW (ref 0.30–0.70)

## 2020-07-26 MED ORDER — COLCHICINE 0.3 MG HALF TABLET
0.3000 mg | ORAL_TABLET | Freq: Two times a day (BID) | ORAL | Status: DC
  Filled 2020-07-26: qty 1

## 2020-07-26 MED ORDER — FUROSEMIDE 10 MG/ML IJ SOLN
8.0000 mg/h | INTRAVENOUS | Status: DC
  Administered 2020-07-26: 5 mg/h via INTRAVENOUS
  Administered 2020-07-27 (×2): 8 mg/h via INTRAVENOUS
  Filled 2020-07-26 (×4): qty 20

## 2020-07-26 MED ORDER — CHLORHEXIDINE GLUCONATE 0.12% ORAL RINSE (MEDLINE KIT)
15.0000 mL | Freq: Two times a day (BID) | OROMUCOSAL | Status: DC
  Administered 2020-07-26 – 2020-08-24 (×59): 15 mL via OROMUCOSAL

## 2020-07-26 MED ORDER — ORAL CARE MOUTH RINSE
15.0000 mL | OROMUCOSAL | Status: DC
  Administered 2020-07-26 – 2020-08-24 (×297): 15 mL via OROMUCOSAL

## 2020-07-26 MED ORDER — HEPARIN SODIUM (PORCINE) 5000 UNIT/ML IJ SOLN
INTRAVENOUS | Status: DC
  Filled 2020-07-26 (×18): qty 10

## 2020-07-26 MED ORDER — TRAMADOL HCL 50 MG PO TABS
50.0000 mg | ORAL_TABLET | ORAL | Status: DC | PRN
  Administered 2020-08-01 – 2020-08-03 (×5): 100 mg
  Filled 2020-07-26 (×6): qty 2

## 2020-07-26 MED ORDER — FUROSEMIDE 10 MG/ML IJ SOLN
60.0000 mg | Freq: Once | INTRAMUSCULAR | Status: AC
  Administered 2020-07-26: 60 mg via INTRAVENOUS
  Filled 2020-07-26: qty 6

## 2020-07-26 MED ORDER — MILRINONE LACTATE IN DEXTROSE 20-5 MG/100ML-% IV SOLN
0.3750 ug/kg/min | INTRAVENOUS | Status: DC
  Administered 2020-07-26 – 2020-07-28 (×6): 0.375 ug/kg/min via INTRAVENOUS
  Filled 2020-07-26 (×7): qty 100

## 2020-07-26 MED ORDER — DOCUSATE SODIUM 50 MG/5ML PO LIQD
200.0000 mg | Freq: Every day | ORAL | Status: DC
  Administered 2020-07-26 – 2020-08-09 (×12): 200 mg
  Filled 2020-07-26 (×11): qty 20

## 2020-07-26 MED ORDER — THIAMINE HCL 100 MG/ML IJ SOLN
Freq: Once | INTRAVENOUS | Status: AC
  Filled 2020-07-26: qty 1000

## 2020-07-26 MED ORDER — PANTOPRAZOLE SODIUM 40 MG PO PACK
40.0000 mg | PACK | Freq: Every day | ORAL | Status: DC
  Administered 2020-07-26 – 2020-08-23 (×29): 40 mg
  Filled 2020-07-26 (×30): qty 20

## 2020-07-26 MED ORDER — OXYCODONE HCL 5 MG PO TABS
5.0000 mg | ORAL_TABLET | ORAL | Status: DC | PRN
  Administered 2020-07-30 (×2): 10 mg
  Administered 2020-07-30: 5 mg
  Administered 2020-07-30: 10 mg
  Filled 2020-07-26 (×4): qty 2

## 2020-07-26 MED ORDER — ROSUVASTATIN CALCIUM 20 MG PO TABS
40.0000 mg | ORAL_TABLET | Freq: Every day | ORAL | Status: DC
  Administered 2020-07-26 – 2020-08-14 (×20): 40 mg
  Filled 2020-07-26 (×20): qty 2

## 2020-07-26 MED ORDER — COLCHICINE 0.3 MG HALF TABLET
0.3000 mg | ORAL_TABLET | Freq: Two times a day (BID) | ORAL | Status: DC
  Administered 2020-07-26 – 2020-07-27 (×4): 0.3 mg
  Filled 2020-07-26 (×5): qty 1
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
## 2020-07-26 NOTE — Op Note (Signed)
Procedure(s): EXPLORATION POST OPERATIVE OPEN HEART Procedure Note  Danny Carpenter male 70 y.o. 07/15/2020 Procedure(s) and Anesthesia Type:    * EXPLORATION POST OPERATIVE OPEN HEART - General  Surgeon(s) and Role:    * Wonda Olds, MD - Primary   Indications: The patient is status post CABG and had hemodynamic compromise in the ICU requiring urgent chest opening.  He is taken the operating room for further exploration.     Surgeon: Wonda Olds   Assistants: Leretha Pol, PA-C  Anesthesia: General endotracheal anesthesia  ASA Class: 5    Procedure Detail  EXPLORATION POST OPERATIVE OPEN HEART The patient is placed in the operating room table.  The anterior chest was cleansed and draped sterilely.  A preop surgical pause was performed.  A sternal retractor was placed between the sternal halves.  Copious irrigation was undertaken.  Aggressive resuscitation was resumed by the anesthesia team.  There was no significant clot found anteriorly or on the right lateral aspect.  However posteriorly there was a small amount of clot.  Upon lifting the heart ice pinpoint area the obtuse marginal distal anastomosis was found to be bleeding.  This was controlled with a single suture.  Aggressive resuscitation continued.  The patient hemodynamics responded well.  Copious irrigation was undertaken.  Once we are happy with the degree of hemostasis chest tubes were returned to the mediastinum and bilateral pleural spaces.  The sternum was reapproximated stainless to wires in standard fashion.  The tissue overlying the sternum was then copiously irrigated.  In layers subcuticular suture was used to reapproximate the skin sterile dressings were applied.  All sponge instruments and needle counts were correct.  It should be noted that prior to leaving the operating room the Impella 5.5 LVAD positioning was confirmed to be appropriate by TEE.  Estimated Blood Loss:  Per anesthesia record          Drains: 3 Blake drains in the bilateral pleural spaces and mediastinum          Blood Given: Per anesthesia record         Specimens: None         Implants: none        Complications:  * No complications entered in OR log *         Disposition: ICU - intubated and critically ill.         Condition: stable

## 2020-07-26 NOTE — Progress Notes (Signed)
1 Day Post-Op Procedure(s) (LRB): EXPLORATION POST OPERATIVE OPEN HEART (N/A) Subjective: Intubated/sedated  Objective: Vital signs in last 24 hours: Temp:  [95.54 F (35.3 C)-99.86 F (37.7 C)] 99.86 F (37.7 C) (04/27 0700) Pulse Rate:  [46-117] 96 (04/27 0700) Cardiac Rhythm: A-V Sequential paced (04/27 0400) Resp:  [12-49] 20 (04/27 0700) BP: (74-223)/(52-124) 129/68 (04/27 0700) SpO2:  [82 %-100 %] 95 % (04/27 0700) Arterial Line BP: (43-318)/(10-107) 87/64 (04/27 0700) FiO2 (%):  [60 %-100 %] 60 % (04/27 0500) Weight:  OX:[id number] kg] 127 kg (04/27 0500)  Hemodynamic parameters for last 24 hours: PAP: (29-66)/(5-44) 60/44 CO:  [4.2 L/min-6.2 L/min] 5.1 L/min CI:  [1.8 L/min/m2-2.7 L/min/m2] 2.2 L/min/m2  Intake/Output from previous day: 04/26 0701 - 04/27 0700 In: 17210.6 [I.V.:6345.6; Blood:5509; IV Piggyback:3996.8] Out: [id number] [Urine:2185; Blood:800; Chest Tube:1790] Intake/Output this shift: No intake/output data recorded.  General appearance: sedated Neurologic: wakes up, by report Heart: regular rate and rhythm, S1, S2 normal, no murmur, click, rub or gallop Lungs: diminished breath sounds bilaterally Abdomen: soft, non-tender; bowel sounds normal; no masses,  no organomegaly Extremities: edema 2+ Wound: dressed, dry  Lab Results: Recent Labs    07/14/2020 2100 07/26/20 0300 07/26/20 0314  WBC 5.4 5.0  --   HGB 10.2* 10.1* 10.2*  HCT 29.6* 29.7* 30.0*  PLT 78* 110*  --    BMET:  Recent Labs    07/12/2020 1933 07/26/20 0300 07/26/20 0314  NA 141  137 138 139  K 3.7  3.5 4.6 4.6  CL 108 105  --   CO2 22 24  --   GLUCOSE 133* 99  --   BUN 17 18  --   CREATININE 0.94 0.99  --   CALCIUM 7.8* 8.7*  --     PT/INR:  Recent Labs    07/15/2020 1933  LABPROT 17.9*  INR 1.5*   ABG    Component Value Date/Time   PHART 7.319 (L) 07/26/2020 0314   HCO3 22.5 07/26/2020 0314   TCO2 24 07/26/2020 0314   ACIDBASEDEF 3.0 (H) 07/26/2020 0314   O2SAT 51.7  07/26/2020 0546   CBG (last 3)  Recent Labs    07/26/20 0305 07/26/20 0414 07/26/20 0505  GLUCAP 103* 95 102*    Assessment/Plan: S/P Procedure(s) (LRB): EXPLORATION POST OPERATIVE OPEN HEART (N/A) Diuresis wean iNO for extubation, depending on ABG  Continue Impella support, add heparin to purge   LOS: 6 days    Wonda Olds 07/26/2020
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
## 2020-07-26 NOTE — Progress Notes (Signed)
ANTICOAGULATION CONSULT NOTE - Initial Consult  Pharmacy Consult for Heparin Indication: Impella 5.5  No Known Allergies  Patient Measurements: Height: '6\' 1"'$  (185.4 cm) Weight: 127 kg (279 lb 15.8 oz) IBW/kg (Calculated) : 79.9 Heparin Dosing Weight: 102.8 kg  Vital Signs: Temp: 99.86 F (37.7 C) (04/27 1315) Temp Source: Core (04/27 1200) BP: 110/82 (04/27 1300) Pulse Rate: 75 (04/27 1315)  Labs: Recent Labs    07/03/2020 1617 07/01/2020 1744 07/17/2020 1748 07/05/2020 1933 07/28/2020 2100 07/26/20 0300 07/26/20 0314  HGB 9.2*   < > 10.9* 10.5* 10.2* 10.1* 10.2*  HCT 28.9*   < > 32.0* 31.0* 29.6* 29.7* 30.0*  PLT 179  --   --   --  78* 110*  --   APTT 99*  --   --  41*  --   --   --   LABPROT 17.1*  --   --  17.9*  --   --   --   INR 1.4*  --   --  1.5*  --   --   --   CREATININE  --   --  0.90 0.94  --  0.99  --    < > = values in this interval not displayed.    Estimated Creatinine Clearance: 99.7 mL/min (by C-G formula based on SCr of 0.99 mg/dL).   Medical History: Past Medical History:  Diagnosis Date  . AAA (abdominal aortic aneurysm) without rupture ([place]) 06/11/2019   infrarenal   Last Assessment & Plan:  Uptodate.  Stable at 4.1cm Repeat visit to vascular surg in 12/2015.  Marland Kitchen Anxiety   . Arthritis   . Chronic fatigue 01/09/2015   Last Assessment & Plan:  Chronic fatigue. Suspect multifactorial including OSA, hyperglycemia (patient not taking insulin as prescribed), pain medications. No other constitutional sx.  Will plan for repeat lab work.   - follow up in 1 mth after sleep study results.  . Chronic pain 04/05/2013   Last Assessment & Plan:   Known Degenerative disc disease and bilateral peripheral neuropathic leg pain   continue cymbalta '60mg'$   reports has trialed injections in past as well as  '5mg'$  hydrocodone with no success. Also tried tramadol in the past with no benefit.  Prescribed Norco 7.5/'325mg'$  Hydrocodone/ Acetaminophen 1-1.5 tabs every 8 hours prn #150.  1 months through 04/17/14  UTox 06/14/14 app  . COPD (chronic obstructive pulmonary disease) ([place])   . CVA (cerebral vascular accident) ([place])   . DDD (degenerative disc disease) 10/08/2004  . Depression   . Diabetic foot infection ([place]) 01/17/2019  . Fracture of rib    Left  . GERD (gastroesophageal reflux disease)   . HTN (hypertension)   . Hypercholesteremia   . Hyperlipidemia 06/11/2019   On Lipitor '80mg'$  per day  Last Assessment & Plan:  PLAN: HLD continue Lipitor '80mg'$  daily  . Osteomyelitis ([place]) 08/05/2019   left foot  . PAD (peripheral artery disease) ([place]) 01/27/2019  . Peripheral vascular disease ([place])   . PONV (postoperative nausea and vomiting)   . PVD (peripheral vascular disease) ([place])   . Seizure [facility])    one time in Oct. 2020  . Senile nuclear sclerosis 12/06/2011  . Sleep apnea    no cpap  . Subacute osteomyelitis of left foot ([place])   . Type 2 diabetes mellitus with other specified complication [facility])     Assessment: 70 yo M presents with NSTEMI and multivessel CAD, now s/p CABG with Impella support. Post-CABG had cardiac arrest with  no flow on Impella. CPR initiated and chest opened at bedside and returned to OR for exploration given significant bleeding. Heparin purge solution held overnight. Received multiple blood products.  Pharmacy asked to start heparin purge this morning. Hgb up to 10.2, pltc up slightly 110 after transfusions. Impella at P7, purge flow of 11.4 ml/hr (~570 units/hr heparin).  Heparin level undetectable this afternoon. Will continue purge and monitor bleeding.  Goal of Therapy:  Heparin level < 0.3 Monitor platelets by anticoagulation protocol: Yes   Plan:  Continue heparin purge solution (50 u/ml) Monitor daily HL, CBC, s/sx bleeding F/u potential plans for systemic heparin once stable from bleeding standpoint  Richardine Service, PharmD, [place] PGY2 Cardiology Pharmacy Resident Phone: [phone] 07/26/2020  2:56 PM  Please check [email or web address]  for unit-specific pharmacy phone numbers.

## 2020-07-26 NOTE — Op Note (Signed)
Procedure(s): EXPLORATION POST OPERATIVE OPEN HEART Procedure Note  Danny Carpenter male 70 y.o. 07/06/2020 Procedure(s) and Anesthesia Type:    * EXPLORATION POST OPERATIVE OPEN HEART - General  Surgeon(s) and Role:    * Wonda Olds, MD - Primary   Indications: The patient is status post CABG and upon arrival to the ICU underwent closed chest CPR.  Subsequently, he had increased chest tube output.  This has been responsive to aggressive fluid resuscitation but his hemodynamics remain poor.  The treatment team has concluded the best option is to open the chest at the bedside to exclude tamponade.  This is done emergently and and lieu of formal consent     Surgeon: Wonda Olds   Assistants: Staff  Anesthesia: General endotracheal anesthesia  ASA Class: 5    Procedure Detail  EXPLORATION POST OPERATIVE OPEN HEART The patient's anterior chest was cleansed and draped sterilely.  A preoperative pause was performed to identify the patient and the planned procedure.  The prior sternotomy incision is reopened.  There was a small amount of hematoma encountered anteriorly and certainly not enough to account for tamponade.  However the hemodynamics did respond to chest opening.  A sternal retractor was placed between the sternal edges and this was covered with a sterile dressing.  The patient is then transported to the OR for further exploration  Estimated Blood Loss:  Minimal         Drains: As had been previously placed          Blood Given: none          Specimens: Nonenone         Implants: none        Complications:  * No complications entered in OR log *         Disposition: 07/01/2020 Transported urgently to the operating room          Condition: unstable

## 2020-07-26 NOTE — Progress Notes (Signed)
RT weaned Nitric off patient at this time. C.O. decreased from 5 to 4.6, O2 increased from 55% to 70% to maintain SPO2 of 90, CVP increased from 11 to 13. BP being monitored will check in on patient at midnight to reassess. RN aware and in agreement if pt not tolerating better will turn Nitric back on.

## 2020-07-26 NOTE — Op Note (Signed)
CARDIOTHORACIC SURGERY OPERATIVE NOTE  Date of Procedure: 07/04/2020 Preoperative Diagnosis: Severe 3-vessel Coronary Artery Disease, ischemic cardiomyopathy  Postoperative Diagnosis: Same  Procedure:    Coronary Artery Bypass Grafting x 5  Left Internal Mammary Artery to Distal Left Anterior Descending Coronary Artery; Saphenous Vein Graft to right posterior Descending Coronary Artery and right posterolateral coronary artery as a sequenced graft; Saphenous Vein Graft to first obtuse Marginal Branch of Left Circumflex Coronary Artery and diagonal Branch Coronary Artery as a sequenced graft; Endoscopic Vein Harvest from right thigh and Lower Leg Epiaortic ultrasonography Right axillary artery cannulation Right femoral artery intra-aortic balloon pump insertion under TEE guidance Impella 5.5 LVAD insertion under TEE and fluoroscopic guidance Completion graft surveillance with indocyanine green fluorescence angiography  Surgeon: B.  Murvin Natal, MD  Assistant: T.  Harriet Pho, PA-C  Anesthesia: General  Operative Findings:  Severely reduced left ventricular systolic function  Good quality left internal mammary artery conduit  Good quality saphenous vein conduit  Good quality target vessels for grafting    BRIEF CLINICAL NOTE AND INDICATIONS FOR SURGERY  70 year old man with a long history of peripheral vascular disease and diabetes and obesity presented with heart failure.  His work-up demonstrated a reduction in his LV function compared with assessment approximately 1-1/2 years ago.  Left heart catheterization demonstrated severe multivessel coronary artery disease and intact valvular function.  He was referred for CABG.  Although relatively high risk patient had evidence for viable myocardium.  He is taken to the operating room for surgical revascularization.   DETAILS OF THE OPERATIVE PROCEDURE  Preparation:  The patient is brought to the operating room on the above mentioned  date and central monitoring was established by the anesthesia team including placement of Swan-Ganz catheter and radial arterial line. The patient is placed in the supine position on the operating table.  Intravenous antibiotics are administered. General endotracheal anesthesia is induced uneventfully. A Foley catheter is placed.  Baseline transesophageal echocardiogram was performed.  Findings were notable for reduced LV function, intact RV function and no significant valvular disorders  The patient's chest, abdomen, both groins, and both lower extremities are prepared and draped in a sterile manner. A time out procedure is performed.   Surgical Approach and Conduit Harvest:  Percutaneous access obtained of the right common femoral artery under ultrasonography guidance.  A guidewire was inserted into the proximal descending aorta under TEE guidance.  This was followed by placement of a balloon pump for stabilizing hemodynamics.  A median sternotomy incision was performed and the left internal mammary artery is dissected from the chest wall and prepared for bypass grafting. The left internal mammary artery is notably good quality conduit. Simultaneously, the greater saphenous vein is obtained from the patient's right thigh using endoscopic vein harvest technique. The saphenous vein is notably fair quality conduit due to relatively large diameter/varicosity. After removal of the saphenous vein, the small surgical incisions in the lower extremity are closed with absorbable suture. Following systemic heparinization, the left internal mammary artery was transected distally noted to have excellent flow.   Extracorporeal Cardiopulmonary Bypass and Myocardial Protection:  Attention is turned to the right infraclavicular fossa where an incision is made overlying the deltopectoral groove.  Dissection carried out the subcutaneous tissue and the pectoralis major and minor muscles are divided.  The right axillary  artery is encircled.  5000 units of heparin were given intravenously.  A 10 mm dacryon graft was sewn end-to-side to the open axillary artery with a running suture  of 5-0 Prolene.  This is de-aired and attached to arterial limb of the cardiopulmonary bypass instrument.  Full dose heparin is then delivered.    Attention is returned to the mediastinum the pericardium is opened. The ascending aorta is known to be calcified based on preoperative CT imaging and epiaortic ultrasonography is performed to discern whether a clamp can be placed safely on the distal ascending aorta.. The  right atrium is cannulated for cardiopulmonary bypass.  Adequate heparinization is verified.   A retrograde cardioplegia cannula is placed through the right atrium into the coronary sinus.  The entire pre-bypass portion of the operation was notable for stable hemodynamics, particularly with the balloon pump in place  Cardiopulmonary bypass was begun and the surface of the heart is inspected. Distal target vessels are selected for coronary artery bypass grafting. A cardioplegia cannula is placed in the ascending aorta.  The patient is allowed to cool passively to 34C systemic temperature.  The aortic cross clamp is applied and cold blood cardioplegia is delivered initially in an antegrade fashion through the aortic root.   Supplemental cardioplegia is given retrograde through the coronary sinus catheter.  Iced saline slush is applied for topical hypothermia.  The initial cardioplegic arrest is rapid with early diastolic arrest.  Repeat doses of cardioplegia are administered intermittently throughout the entire cross clamp portion of the operation through the aortic root,  through the coronary sinus catheter, and through subsequently placed vein grafts in order to maintain completely flat electrocardiogram.   Coronary Artery Bypass Grafting:   The  posterior descending branch and the posterolateral branch of the right coronary  artery were grafted using a reversed saphenous vein graft in a sequenced fashion.  At the site of distal anastomoses the target vessels were good quality and measured approximately 1.5 mm in diameter.  The first obtuse marginal branch of the left circumflex coronary artery and the diagonal branch of the left anterior descending coronary artery were grafted using a reversed saphenous vein graft in a sequenced fashion.  At the site of distal anastomoses the target vessels were good quality and measured approximately 1.5 mm in diameter.    The distal left anterior coronary artery was grafted with the left internal mammary artery in an end-to-side fashion.  At the site of distal anastomosis the target vessel was good quality and measured approximately 1.5 mm in diameter. Anastomotic patency and runoff was confirmed with indocyanine green fluorescence imaging (SPY).  All proximal vein graft anastomoses were placed directly to the ascending aorta prior to removal of the aortic cross clamp.  A reanimation dose of cardioplegia was given antegrade and retrograde.  De-airing procedures were performed and the aortic cross-clamp was removed.   Procedure Completion:  All proximal and distal coronary anastomoses were inspected for hemostasis and appropriate graft orientation. Epicardial pacing wires are fixed to the right ventricular outflow tract and to the right atrial appendage. The patient is rewarmed to 37C temperature. The patient is weaned and disconnected from cardiopulmonary bypass.  The patient's rhythm at separation from bypass was sinus.  The patient was weaned from cardiopulmonary bypass with moderate inotropic support and the balloon pump support.  Followup transesophageal echocardiogram performed after separation from bypass revealed no changes from the preoperative exam, given his severely reduced function however, the decision was made to place an Impella 5.5 LVAD through the existing right  axillary artery graft.  This was done with a modified Seldinger technique and under TEE and fluoroscopic guidance.  Once the  Impella was positioned, the balloon pump was removed.  Protamine was administered to reverse the anticoagulation.   The mediastinum and bilateral pleural spaces were inspected for hemostasis and irrigated with saline solution. The mediastinum and both pleural spaces were drained using fluted chest tubes placed through separate stab incisions inferiorly.  The soft tissues anterior to the aorta were reapproximated loosely. The sternum is closed with double strength sternal wire. The soft tissues anterior to the sternum were closed in multiple layers and the skin is closed with a running subcuticular skin closure.  The post-bypass portion of the operation was notable for stable rhythm and hemodynamics.    Disposition:  The patient tolerated the procedure well and is transported to the surgical intensive care in stable condition. There are no intraoperative complications. All sponge instrument and needle counts are verified correct at completion of the operation.    Jayme Cloud, MD 07/26/2020 9:15 AM

## 2020-07-26 NOTE — Progress Notes (Signed)
  Echocardiogram 2D Echocardiogram has been performed.  Randa Lynn Vaughn Frieze 07/26/2020, 9:27 AM

## 2020-07-26 NOTE — Progress Notes (Signed)
TCTS BRIEF SICU PROGRESS NOTE  1 Day Post-Op  S/P Procedure(s) (LRB): EXPLORATION POST OPERATIVE OPEN HEART (N/A)   Stable day AV paced w/ stable hemodynamics Impella on P7 w/ stable flows and good C.O. Nitric oxide almost off, milrinone 0.3 and Epi @ 4 O2 sats 95-97% on 55% FiO2 PEEP 10 Excellent UOP > 100 mL/hr Labs okay  Plan: Continue current plan  Rexene Alberts, MD 07/26/2020 5:11 PM

## 2020-07-26 NOTE — Plan of Care (Signed)

## 2020-07-27 ENCOUNTER — Inpatient Hospital Stay (HOSPITAL_COMMUNITY): Payer: No Typology Code available for payment source

## 2020-07-27 DIAGNOSIS — R570 Cardiogenic shock: Secondary | ICD-10-CM | POA: Diagnosis not present

## 2020-07-27 LAB — POCT I-STAT 7, (LYTES, BLD GAS, ICA,H+H)
Acid-Base Excess: 0 mmol/L (ref 0.0–2.0)
Bicarbonate: 24.5 mmol/L (ref 20.0–28.0)
Calcium, Ion: 1.14 mmol/L — ABNORMAL LOW (ref 1.15–1.40)
HCT: 27 % — ABNORMAL LOW (ref 39.0–52.0)
Hemoglobin: 9.2 g/dL — ABNORMAL LOW (ref 13.0–17.0)
O2 Saturation: 95 %
Patient temperature: 37
Potassium: 4 mmol/L (ref 3.5–5.1)
Sodium: 139 mmol/L (ref 135–145)
TCO2: 26 mmol/L (ref 22–32)
pCO2 arterial: 37.3 mmHg (ref 32.0–48.0)
pH, Arterial: 7.426 (ref 7.350–7.450)
pO2, Arterial: 75 mmHg — ABNORMAL LOW (ref 83.0–108.0)

## 2020-07-27 LAB — LACTATE DEHYDROGENASE: LDH: 181 U/L (ref 98–192)

## 2020-07-27 LAB — ECHO INTRAOPERATIVE TEE
AV Mean grad: 5 mmHg
AV Peak grad: 10.2 mmHg
Ao pk vel: 1.6 m/s
Height: 73 in
Weight: 3835.2 oz

## 2020-07-27 LAB — CBC
HCT: 28.1 % — ABNORMAL LOW (ref 39.0–52.0)
Hemoglobin: 9.5 g/dL — ABNORMAL LOW (ref 13.0–17.0)
MCH: 29.1 pg (ref 26.0–34.0)
MCHC: 33.8 g/dL (ref 30.0–36.0)
MCV: 86.2 fL (ref 80.0–100.0)
Platelets: 114 10*3/uL — ABNORMAL LOW (ref 150–400)
RBC: 3.26 MIL/uL — ABNORMAL LOW (ref 4.22–5.81)
RDW: 15.5 % (ref 11.5–15.5)
WBC: 8.1 10*3/uL (ref 4.0–10.5)
nRBC: 0.2 % (ref 0.0–0.2)

## 2020-07-27 LAB — GLUCOSE, CAPILLARY
Glucose-Capillary: 108 mg/dL — ABNORMAL HIGH (ref 70–99)
Glucose-Capillary: 119 mg/dL — ABNORMAL HIGH (ref 70–99)
Glucose-Capillary: 120 mg/dL — ABNORMAL HIGH (ref 70–99)
Glucose-Capillary: 124 mg/dL — ABNORMAL HIGH (ref 70–99)
Glucose-Capillary: 124 mg/dL — ABNORMAL HIGH (ref 70–99)
Glucose-Capillary: 128 mg/dL — ABNORMAL HIGH (ref 70–99)
Glucose-Capillary: 128 mg/dL — ABNORMAL HIGH (ref 70–99)
Glucose-Capillary: 129 mg/dL — ABNORMAL HIGH (ref 70–99)
Glucose-Capillary: 129 mg/dL — ABNORMAL HIGH (ref 70–99)
Glucose-Capillary: 131 mg/dL — ABNORMAL HIGH (ref 70–99)
Glucose-Capillary: 157 mg/dL — ABNORMAL HIGH (ref 70–99)
Glucose-Capillary: 201 mg/dL — ABNORMAL HIGH (ref 70–99)

## 2020-07-27 LAB — COMPREHENSIVE METABOLIC PANEL
ALT: 14 U/L (ref 0–44)
AST: 21 U/L (ref 15–41)
Albumin: 3.1 g/dL — ABNORMAL LOW (ref 3.5–5.0)
Alkaline Phosphatase: 38 U/L (ref 38–126)
Anion gap: 8 (ref 5–15)
BUN: 18 mg/dL (ref 8–23)
CO2: 25 mmol/L (ref 22–32)
Calcium: 8.2 mg/dL — ABNORMAL LOW (ref 8.9–10.3)
Chloride: 102 mmol/L (ref 98–111)
Creatinine, Ser: 1.17 mg/dL (ref 0.61–1.24)
GFR, Estimated: >60 mL/min (ref >60)
Glucose, Bld: 123 mg/dL — ABNORMAL HIGH (ref 70–99)
Potassium: 3.7 mmol/L (ref 3.5–5.1)
Sodium: 135 mmol/L (ref 135–145)
Total Bilirubin: 1.1 mg/dL (ref 0.3–1.2)
Total Protein: 5 g/dL — ABNORMAL LOW (ref 6.5–8.1)

## 2020-07-27 LAB — COOXEMETRY PANEL
Carboxyhemoglobin: 1.1 % (ref 0.5–1.5)
Methemoglobin: 1.2 % (ref 0.0–1.5)
O2 Saturation: 69.1 %
Total hemoglobin: 9.7 g/dL — ABNORMAL LOW (ref 12.0–16.0)

## 2020-07-27 LAB — HEPARIN LEVEL (UNFRACTIONATED): Heparin Unfractionated: <0.1 IU/mL — ABNORMAL LOW (ref 0.30–0.70)

## 2020-07-27 MED ORDER — ALBUMIN HUMAN 25 % IV SOLN
12.5000 g | Freq: Once | INTRAVENOUS | Status: AC
  Administered 2020-07-27: 12.5 g via INTRAVENOUS
  Filled 2020-07-27: qty 50

## 2020-07-27 MED ORDER — POTASSIUM CHLORIDE 10 MEQ/50ML IV SOLN
10.0000 meq | INTRAVENOUS | Status: AC
  Administered 2020-07-27 (×3): 10 meq via INTRAVENOUS
  Filled 2020-07-27 (×3): qty 50

## 2020-07-27 MED ORDER — PROPOFOL 1000 MG/100ML IV EMUL
5.0000 ug/kg/min | INTRAVENOUS | Status: DC
  Administered 2020-07-27: 10 ug/kg/min via INTRAVENOUS
  Administered 2020-07-28: 20 ug/kg/min via INTRAVENOUS
  Administered 2020-07-28: 30 ug/kg/min via INTRAVENOUS
  Filled 2020-07-27: qty 100
  Filled 2020-07-27: qty 200
  Filled 2020-07-27 (×2): qty 100

## 2020-07-27 MED ORDER — DIGOXIN 125 MCG PO TABS
0.1250 mg | ORAL_TABLET | Freq: Every day | ORAL | Status: DC
  Administered 2020-07-27 – 2020-07-28 (×2): 0.125 mg via NASOGASTRIC
  Filled 2020-07-27 (×2): qty 1

## 2020-07-27 MED ORDER — INSULIN ASPART 100 UNIT/ML IJ SOLN
0.0000 [IU] | INTRAMUSCULAR | Status: DC
  Administered 2020-07-27: 3 [IU] via SUBCUTANEOUS
  Administered 2020-07-27: 2 [IU] via SUBCUTANEOUS
  Administered 2020-07-27: 5 [IU] via SUBCUTANEOUS
  Administered 2020-07-28: 3 [IU] via SUBCUTANEOUS
  Administered 2020-07-28: 2 [IU] via SUBCUTANEOUS
  Administered 2020-07-28 (×2): 3 [IU] via SUBCUTANEOUS
  Administered 2020-07-28: 5 [IU] via SUBCUTANEOUS
  Administered 2020-07-28: 2 [IU] via SUBCUTANEOUS
  Administered 2020-07-29: 11 [IU] via SUBCUTANEOUS
  Administered 2020-07-29 (×2): 3 [IU] via SUBCUTANEOUS
  Administered 2020-07-29: 8 [IU] via SUBCUTANEOUS
  Administered 2020-07-29: 11 [IU] via SUBCUTANEOUS
  Administered 2020-07-29: 15 [IU] via SUBCUTANEOUS
  Administered 2020-07-30: 8 [IU] via SUBCUTANEOUS
  Administered 2020-07-30 (×3): 11 [IU] via SUBCUTANEOUS
  Administered 2020-07-30: 8 [IU] via SUBCUTANEOUS
  Administered 2020-07-31: 15 [IU] via SUBCUTANEOUS
  Administered 2020-07-31: 8 [IU] via SUBCUTANEOUS
  Administered 2020-07-31: 15 [IU] via SUBCUTANEOUS

## 2020-07-27 MED FILL — Sodium Chloride IV Soln 0.9%: INTRAVENOUS | Qty: 3000 | Status: AC

## 2020-07-27 MED FILL — Magnesium Sulfate Inj 50%: INTRAMUSCULAR | Qty: 10 | Status: AC

## 2020-07-27 MED FILL — Electrolyte-R (PH 7.4) Solution: INTRAVENOUS | Qty: 4000 | Status: AC

## 2020-07-27 MED FILL — Heparin Sodium (Porcine) Inj 1000 Unit/ML: INTRAMUSCULAR | Qty: 30 | Status: AC

## 2020-07-27 MED FILL — Potassium Chloride Inj 2 mEq/ML: INTRAVENOUS | Qty: 40 | Status: AC

## 2020-07-27 MED FILL — Mannitol IV Soln 20%: INTRAVENOUS | Qty: 500 | Status: AC

## 2020-07-27 MED FILL — Sodium Bicarbonate IV Soln 8.4%: INTRAVENOUS | Qty: 50 | Status: AC

## 2020-07-27 MED FILL — Calcium Chloride Inj 10%: INTRAVENOUS | Qty: 10 | Status: AC

## 2020-07-27 NOTE — Progress Notes (Addendum)
TCTS DAILY ICU PROGRESS NOTE                   [address]            [place] [address]          [id number]   2 Days Post-Op Procedure(s) (LRB): EXPLORATION POST OPERATIVE OPEN HEART (N/A)  Total Length of Stay:  LOS: 7 days   Subjective: Sedated on vent  Objective: Vital signs in last 24 hours: Temp:  [99.68 F (37.6 C)-100.4 F (38 C)] 99.9 F (37.7 C) (04/28 0615) Pulse Rate:  [65-112] 92 (04/28 0615) Cardiac Rhythm: A-V Sequential paced (04/28 0000) Resp:  [17-23] 20 (04/28 0615) BP: (85-122)/(70-89) 100/84 (04/28 0600) SpO2:  [66 %-98 %] 97 % (04/28 0615) Arterial Line BP: (64-139)/(57-79) 113/73 (04/28 0615) FiO2 (%):  [55 %-70 %] 60 % (04/28 0401)  Filed Weights   07/24/20 0304 07/28/2020 0522 07/26/20 0500  Weight: 109.6 kg 108.7 kg 127 kg    Weight change:    Hemodynamic parameters for last 24 hours: PAP: (37-65)/(20-41) 50/27 CVP:  [9 mmHg-61 mmHg] 14 mmHg CO:  [4.7 L/min-6.8 L/min] 5 L/min CI:  [2 L/min/m2-2.9 L/min/m2] 2.2 L/min/m2   Vent Mode: PRVC FiO2 (%):  [55 %-70 %] 60 % Set Rate:  [20 bmp] 20 bmp Vt Set:  [630 mL] 630 mL PEEP:  [10 cmH20] 10 cmH20 Plateau Pressure:  [31 cmH20] 31 cmH20   Intake/Output from previous day: 04/27 0701 - 04/28 0700 In: 3361.9 [I.V.:2458.8; NG/GT:330; IV Piggyback:298.1] Out: 5330 [Urine:4100; Emesis/NG output:450; Chest Tube:780]  Intake/Output this shift: No intake/output data recorded.  Current Meds: Scheduled Meds: . sodium chloride   Intravenous Once  . acetaminophen  1,000 mg Oral Q6H   Or  . acetaminophen (TYLENOL) oral liquid 160 mg/5 mL  1,000 mg Per Tube Q6H  . aspirin EC  325 mg Oral Daily   Or  . aspirin  324 mg Per Tube Daily  . bisacodyl  10 mg Oral Daily   Or  . bisacodyl  10 mg Rectal Daily  . chlorhexidine gluconate (MEDLINE KIT)  15 mL Mouth Rinse BID  . Chlorhexidine Gluconate Cloth  6 each Topical Daily  . colchicine  0.3 mg Per Tube BID  . digoxin  0.125 mg  Per NG tube Daily  . docusate  200 mg Per Tube Daily  . gabapentin  600 mg Oral TID  . mouth rinse  15 mL Mouth Rinse 10 times per day  . pantoprazole sodium  40 mg Per Tube Daily  . rosuvastatin  40 mg Per Tube Daily  . sodium chloride flush  3 mL Intravenous Q12H   Continuous Infusions: . sodium chloride    . amiodarone 30 mg/hr (07/27/20 0700)  . cefUROXime (ZINACEF)  IV Stopped (07/26/20 2029)  . dexmedetomidine (PRECEDEX) IV infusion 0.7 mcg/kg/hr (07/27/20 0700)  . epinephrine 2 mcg/min (07/27/20 0700)  . furosemide (LASIX) 200 mg in dextrose 5% 100 mL (62m/mL) infusion 8 mg/hr (07/27/20 0700)  . impella catheter heparin 50 unit/mL in dextrose 5%    . insulin 2 mL/hr at 07/27/20 0700  . lactated ringers    . lactated ringers    . lactated ringers 10 mL/hr at 07/27/20 0700  . milrinone 0.375 mcg/kg/min (07/27/20 0700)  . niCARDipine Stopped (07/26/20 0404)  . norepinephrine (LEVOPHED) Adult infusion Stopped (07/26/20 04287  . potassium chloride 10 mEq (07/27/20 0749)   PRN Meds:.dextrose, lactated ringers, metoprolol tartrate, midazolam,  morphine injection, ondansetron (ZOFRAN) IV, oxyCODONE, sodium chloride flush, traMADol  Sedated on vent Cor RRR Lungs clear anteriorly Abd - no obvious tenderness Ext + edema, cool fressings Intact PAS in place     Lab Results: CBC: Recent Labs    07/26/20 1409 07/27/20 0304  WBC 7.0 8.1  HGB 9.5* 9.5*  HCT 28.1* 28.1*  PLT 104* 114*   BMET:  Recent Labs    07/26/20 1409 07/27/20 0304  NA 139 135  K 4.2 3.7  CL 106 102  CO2 24 25  GLUCOSE 113* 123*  BUN 18 18  CREATININE 1.15 1.17  CALCIUM 8.1* 8.2*    CMET: Lab Results  Component Value Date   WBC 8.1 07/27/2020   HGB 9.5 (L) 07/27/2020   HCT 28.1 (L) 07/27/2020   PLT 114 (L) 07/27/2020   GLUCOSE 123 (H) 07/27/2020   CHOL 159 07/02/2020   TRIG 192 (H) 07/17/2020   HDL 35 (L) 07/15/2020   LDLCALC 86 07/16/2020   ALT 14 07/27/2020   AST 21 07/27/2020    NA 135 07/27/2020   K 3.7 07/27/2020   CL 102 07/27/2020   CREATININE 1.17 07/27/2020   BUN 18 07/27/2020   CO2 25 07/27/2020   INR 1.5 (H) 07/01/2020   HGBA1C 11.6 (H) 07/05/2020      PT/INR:  Recent Labs    07/19/2020 1933  LABPROT 17.9*  INR 1.5*   Radiology: [facility] Chest Port 1 View  Result Date: 07/27/2020 CLINICAL DATA:  Respiratory failure EXAM: PORTABLE CHEST 1 VIEW COMPARISON:  07/26/2020 FINDINGS: Endotracheal tube seen 5.6 cm above the carina. Nasogastric tube extends into the upper abdomen. Right internal jugular Swan-Ganz catheter tip noted within the main pulmonary artery. Right subclavian left ventricular assist device appears unchanged overlying the expected left ventricular outflow tract. Bilateral chest tubes and mediastinal drain are noted. Lung volumes are small. Pulmonary insufflation has diminished slightly since prior examination. Small bilateral pleural effusions are present. Retrocardiac opacification represents a combination of atelectasis and posterior layering pleural fluid. No pneumothorax. Mild to moderate cardiomegaly is stable. Trace perihilar pulmonary edema persists, unchanged. Multiple remote left rib fractures noted. IMPRESSION: Stable support lines and tubes. Bilateral chest tubes in place. No pneumothorax. Small bilateral pleural effusions. Stable cardiomegaly. Stable trace perihilar pulmonary edema, likely cardiogenic in nature. Electronically Signed   By: Fidela Salisbury MD   On: 07/27/2020 06:12   ECHOCARDIOGRAM LIMITED  Result Date: 07/26/2020    ECHOCARDIOGRAM LIMITED REPORT   Patient Name:   [facility] Date of Exam: 07/26/2020 Medical Rec #:  [id number]     Height:       73.0 in Accession #:    [id number]    Weight:       280.0 lb Date of Birth:  1950/09/17    BSA:          2.482 m Patient Age:    75 years      BP:           99/76 mmHg Patient Gender: M             HR:           92 bpm. Exam Location:  Inpatient Procedure: Limited Echo Indications:     I50.23 Acute on chronic systolic (congestive) heart failure  History:        Patient has prior history of Echocardiogram examinations, most  recent 07/29/2020. COPD, Stroke and PAD; Risk                 Factors:Hypertension, Diabetes, Dyslipidemia, Sleep Apnea and                 GERD. Abdominal Aortic Aneurysm.  Sonographer:    Tiffany Dance Referring Phys: [phone] AMY D CLEGG IMPRESSIONS  1. Limited echo to evaluate impella positioning. Impella measures 4.9 cm from aortic valve  2. Left ventricular ejection fraction, by estimation, is <20%. The left ventricle has severely decreased function.  3. Right ventricular systolic function is severely reduced. The right ventricular size is normal. FINDINGS  Left Ventricle: Left ventricular ejection fraction, by estimation, is <20%. The left ventricle has severely decreased function. Right Ventricle: The right ventricular size is normal. Right ventricular systolic function is severely reduced. Oswaldo Milian MD Electronically signed by Oswaldo Milian MD Signature Date/Time: 07/26/2020/10:48:24 AM    Final      Assessment/Plan: S/P Procedure(s) (LRB): EXPLORATION POST OPERATIVE OPEN HEART (N/A)  1 Tmax 100.4 2 AHF team managing cardiogenic shock, Impella has adeq flows- asking if can start heparin got purges. Co-Ox 69. Remains on lasix gtt with good UOP. On epi/milrinone. Digoxin added 3 normal creat 4 pacing dicontinued 5 anemia has stabilized 6 no leukocytosis 7 LFT's normal 8 CXR- Stable support lines and tubes.  Bilateral chest tubes in place. No pneumothorax. Small bilateral pleural effusions.  Stable cardiomegaly. Stable trace perihilar pulmonary edema, likely cardiogenic in nature.  9 remains on full vent support, wean as able  John Giovanni PA-C Pager [phone] 07/27/2020 7:59 AM   Pt seen and examined; agree with PA documentation. Diuresing well; successfully weaned off iNO. Plan continued diuresis; continue  current Impella support. Wean vent for extubation. I would prefer no systemic heparin; atrial pace at 90 for CI. Elgene Coral Z. Orvan Seen, [place]

## 2020-07-27 NOTE — Plan of Care (Signed)

## 2020-07-27 NOTE — TOC Initial Note (Addendum)
Transition of Care (TOC) - Initial/Assessment Note  Heart Failure   Patient Details  Name: Danny Carpenter MRN: [id number] Date of Birth: 27-Jan-1951  Transition of Care [facility]) CM/SW Contact:    [place], [place] Phone Number: 07/27/2020, 2:27 PM  Clinical Narrative:        CSW attempted to visit the patient at bedside to introduce self as the heart failure social worker and to complete a very brief SDOH screening with the patient to address social needs as needed however the patient was unresponsive and unable to engage in conversation at this time.   CSW will continue to follow for discharge needs and will check back with the patient at another time.               Barriers to Discharge: Continued Medical Work up   Patient Goals and CMS Choice        Expected Discharge Plan and Services   In-house Referral: Clinical Social Work                                            Prior Living Arrangements/Services                       Activities of Daily Living Home Assistive Devices/Equipment: Electric scooter ADL Screening (condition at time of admission) Patient's cognitive ability adequate to safely complete daily activities?: Yes Is the patient deaf or have difficulty hearing?: No Does the patient have difficulty seeing, even when wearing glasses/contacts?: No Does the patient have difficulty concentrating, remembering, or making decisions?: No Patient able to express need for assistance with ADLs?: Yes Does the patient have difficulty dressing or bathing?: No Independently performs ADLs?: Yes (appropriate for developmental age) Does the patient have difficulty walking or climbing stairs?: No Weakness of Legs: None Weakness of Arms/Hands: None  Permission Sought/Granted                  Emotional Assessment Appearance:: Appears stated age Attitude/Demeanor/Rapport: Unable to Assess Affect (typically observed): Unable to Assess     Psych  Involvement: No (comment)  Admission diagnosis:  Acute on chronic heart failure (HCC) [I50.9] S/P CABG x 5 [Z95.1] Patient Active Problem List   Diagnosis Date Noted  . Cardiogenic shock ([place])   . S/P CABG x 5 07/01/2020  . Non-ST elevation (NSTEMI) myocardial infarction ([place])   . Essential hypertension   . Acute on chronic heart failure ([place]) 07/14/2020  . Osteomyelitis ([place]) 08/05/2019  . AAA (abdominal aortic aneurysm) without rupture ([place]) 06/11/2019  . Hyperlipidemia 06/11/2019  . PICC (peripherally inserted central catheter) in place 02/16/2019  . Encounter for medication management 02/16/2019  . PAD (peripheral artery disease) ([place]) 01/27/2019  . Type 2 diabetes mellitus with other specified complication ([place])   . Subacute osteomyelitis of left foot ([place])   . PVD (peripheral vascular disease) ([place])   . CVA (cerebral vascular accident) ([place]) 01/17/2019  . Diabetic foot infection ([place]) 01/17/2019  . Weight loss 03/19/2015  . Chest wall pain 02/06/2015  . Chronic fatigue 01/09/2015  . Eye pain 09/23/2014  . Pain medication agreement completed 02/06/2014  . Chronic pain 04/05/2013  . Senile nuclear sclerosis 12/06/2011  . Tear film insufficiency 12/06/2011  . Depression 01/07/2005  . DDD (degenerative disc disease) 10/08/2004  . Proteinuria 10/21/2002   PCP:  Serita Grammes, MD Pharmacy:  [facility] [place],  - [address] AT [address] [place] [address] Phone: [phone] Fax: [phone]     Social Determinants of Health (SDOH) Interventions    Readmission Risk Interventions Readmission Risk Prevention Plan 01/29/2019 01/20/2019  Post Dischage Appt - Complete  Medication Screening - Complete  Transportation Screening Complete Complete  PCP or Specialist Appt within 5-7 Days Complete -  Home Care Screening Complete -  Medication Review (RN CM) Complete -  Some recent data might be hidden    Sashia Campas, MSW, LCSWA [phone] Heart Failure Social Worker

## 2020-07-27 NOTE — Hospital Course (Addendum)
HPI:   70 year old man with a history of hyper lipidemia hypertension diabetes history of stroke and peripheral vascular disease presented with chest pain earlier this month.  This occurred at rest.  Chest pain radiated to the neck.  He was also experiencing shortness of breath at that time.  Later, he asked noted orthopnea.  After seeking attention, stress test was performed which demonstrated reversible ischemia in the apical segment with global hypokinesis; EF 25% He underwent LHC showing severe multivessel CAD and is referred for surgery. He has been stable over the weekend but still states he doesn't feel well.   Hospital Course:   The patient has had a complex hospital course.  On 07/29/2020 he underwent CABG.  An Impella 5.5 was placed during the procedure.  He has had consistent difficulty with cardiogenic shock requiring multiple pressors with slow wean over time.  A limited echocardiogram done 429 showed ejection fraction of less than 20% but by 5 9 the echocardiogram did show some improvement with a EF in the range of 30 to 35%.  He has required aggressive diuresis and eventually required CVVH and nephrology consultation.  Postoperative bleeding requiring multiple transfusions.  He had a postoperative thrombocytopenia that has resolved over time.  He has had postoperative atrial fibrillation and did develop on 08/16/2020 episodes of torsades and possible QT prolongation so amiodarone has been discontinued.  He is on mexiletine.  His neurological status has been complex CT scan on 5 6 showed multiple posterior circulation infarcts and repeat on 510 showed no change.  He has intermittently followed commands but only had a minimal response.  Neurology has been consulted and is assisting with management.  Palliative care has been consulted and is assisting with goals.  He developed significant fevers and elevation of his procalcitonin on 429.  A cholecystostomy drain was placed for a calculus  cholecystitis on 08/16/2020.  He is also on Zosyn for this.  He has had hypoxemic respiratory failure throughout the postoperative period assisted in management by CCM.  He underwent tracheostomy on 07/31/2020.  He has had pneumonia which has been treated.  On 07/30/2020 the Impella was removed by Dr. Orvan Seen.  On 08/23/2020 after multiple discussions with the family and consultants a decision was made to place the patient on comfort care measures only.

## 2020-07-27 NOTE — Consult Note (Addendum)
NAMEMelecio Carpenter, MRN:  [id number], DOB:  January 05, 1951, LOS: 7 ADMISSION DATE:  07/22/2020, CONSULTATION DATE: 07/28/2018 REFERRING MD: Dr. Orvan Carpenter, CHIEF COMPLAINT: Ventilator dependent  History of Present Illness:  Danny Carpenter is a 70 year old male with past medical history significant for hypertension, hyperlipidemia, type 2 diabetes, CVA, AAA, left foot amputation s/p fem pop bypass and heart failure who presented to the emergency department 4/21 after undergoing outpatient stress test that revealed EF of 20% and acute ischemia.  Since admission patient has been evaluated by cardiology and cardiothoracic surgery and patient underwent TEE and left heart cath.  LHC/RHC revealed severe three-vessel coronary artery disease with moderately elevated left heart and pulmonary artery pressures.  At that time patient was placed on goal-directed medical therapy for acute systolic congestive heart failure and CTS surgery was consulted for CABG potential.  Patient underwent CABG x5 with Dr. Orvan Carpenter 4/26, post CABG CODE BLUE called due to low flow on Impella, CTS notified.  Patient underwent bedside exploratory postoperative open heart where Dr. Julien Carpenter reopened the chest incision and hematoma was removed, patient's hemodynamics improved upon opening sternotomy.  PCCM consulted morning of 4/28 due to difficulty weaning ventilator  Pertinent  Medical History  Hypertension, hyperlipidemia, type 2 diabetes, CVA, AAA, left foot amputation s/p fem pop bypass and heart failure   Significant Hospital Events: Including procedures, antibiotic start and stop dates in addition to other pertinent events   . 4/21 presented to the ED for evaluation of abnormal stress test . 4/22 left and right heart cath severe multivessel disease . 4/26 CABG x5 with hemodynamic instability post requiring brief CPR with eventual return back to the OR anastomosis bleeding was Carpenter . 4/28 remains ventilator dependent, slowly weaning  PEEP  Interim History / Subjective:  No issues overnight   Objective   Blood pressure (!) 88/65, pulse 90, temperature 100.04 F (37.8 C), resp. rate 18, height '6\' 1"'$  (1.854 m), weight 127 kg, SpO2 90 %. PAP: (37-60)/(20-41) 58/26 CVP:  [9 mmHg-61 mmHg] 16 mmHg CO:  [4 L/min-6.8 L/min] 4.5 L/min CI:  [1.7 L/min/m2-2.9 L/min/m2] 2 L/min/m2  Vent Mode: PSV;CPAP FiO2 (%):  [40 %-70 %] 40 % Set Rate:  [20 bmp] 20 bmp Vt Set:  [630 mL] 630 mL PEEP:  [8 cmH20-10 cmH20] 8 cmH20 Pressure Support:  [10 cmH20] 10 cmH20 Plateau Pressure:  [30 cmH20-31 cmH20] 30 cmH20   Intake/Output Summary (Last 24 hours) at 07/27/2020 1016 Last data filed at 07/27/2020 1000 Gross per 24 hour  Intake 3161.61 ml  Output 5310 ml  Net -2148.39 ml   Filed Weights   07/24/20 0304 07/22/2020 0522 07/26/20 0500  Weight: 109.6 kg 108.7 kg 127 kg    Examination: General: Acute on chronic ill-appearing middle-aged male lying in bed on mechanical ventilation in no acute distress  HEENT: ETT, MM pink/moist, PERRL,  Neuro: Able to follow simple commands CV: s1s2 regular rate and rhythm, no murmur, rubs, or gallops,  PULM: Clear to auscultation bilaterally, tolerating ventilator, no added breath sounds GI: soft, bowel sounds active in all 4 quadrants, non-tender, non-distended Extremities: warm/dry, generalized nonpitting edema  Skin: no rashes or lesions  Labs/imaging that I have personally reviewed    4/27 Limited echo to evaluate Impella, good positioning, EF less than 20%  Resolved Hospital Problem list     Assessment & Plan:  Acute Hypoxic Respiratory Failure  -Secondary to cardiogenic shock status post CABG P: Wean FiO2 and PEEP as able Mentation adequate for SBT Continue  ventilator support with lung protective strategies  Head of bed elevated 30 degrees. Plateau pressures less than 30 cm H20.  Follow intermittent chest x-ray and ABG.   SAT/SBT as tolerated, mentation preclude extubation  Ensure  adequate pulmonary hygiene  Follow cultures  VAP bundle in place  PAD protocol Diurese with IV Lasix drip as below  Cardiogenic shock CAD s/p x5 P: Followed by cardiology and cardiothoracic surgery Continue Lasix drip, diuresing well Continue pressor support with epi Milrinone remains at 0.375 Impella management per cardiology Starting digoxin Continue aspirin and Crestor Strict intake and output Daily weight  Anemia with postop bleeding -Patient has received multiple blood products post CABG hemoglobin stable Thrombocytopenia -Platelet count stable at 114 P: Trend CBC Transfuse per protocol Hemoglobin goal greater than 8  PAD status post partial left foot amputation AAA P: Supportive care  Type 2 diabetes -Hemoglobin A1c 11.6 P: Currently on insulin drip, continue Every hour CBG Resume home regiment when appropriate  Best practice   Diet:  Tube Feed  Pain/Anxiety/Delirium protocol (if indicated): Yes (RASS goal -1) VAP protocol (if indicated): Yes DVT prophylaxis: Contraindicated GI prophylaxis: PPI Glucose control:  Insulin gtt Central venous access:  Yes, and it is still needed Arterial line:  Yes, and it is still needed Foley:  Yes, and it is still needed Mobility:  bed rest  PT consulted: N/A Last date of multidisciplinary goals of care discussion Per primary Code Status:  full code Disposition: ICU  Labs   CBC: Recent Labs  Lab 07/24/2020 1617 07/17/2020 1744 07/13/2020 2100 07/26/20 0300 07/26/20 0314 07/26/20 1409 07/27/20 0304 07/27/20 0853  WBC 11.4*  --  5.4 5.0  --  7.0 8.1  --   HGB 9.2*   < > 10.2* 10.1* 10.2* 9.5* 9.5* 9.2*  HCT 28.9*   < > 29.6* 29.7* 30.0* 28.1* 28.1* 27.0*  MCV 86.8  --  86.0 85.8  --  86.2 86.2  --   PLT 179  --  78* 110*  --  104* 114*  --    < > = values in this interval not displayed.    Basic Metabolic Panel: Recent Labs  Lab 07/22/20 0246 07/23/20 0133 07/24/20 0322 07/27/2020 0542 07/11/2020 0840  07/11/2020 1748 07/02/2020 1933 07/26/20 0300 07/26/20 0314 07/26/20 1409 07/27/20 0304 07/27/20 0853  NA 134* 130*   < > 134*   < > 140 141  137 138 139 139 135 139  K 3.8 4.1   < > 4.2   < > 4.3 3.7  3.5 4.6 4.6 4.2 3.7 4.0  CL 98 97*   < > 98   < > 103 108 105  --  106 102  --   CO2 27 25   < > 27  --   --  22 24  --  24 25  --   GLUCOSE 218* 351*   < > 133*   < > 178* 133* 99  --  113* 123*  --   BUN 24* 30*   < > 21   < > '19 17 18  '$ --  18 18  --   CREATININE 1.19 1.33*   < > 0.98   < > 0.90 0.94 0.99  --  1.15 1.17  --   CALCIUM 9.0 8.9   < > 9.3  --   --  7.8* 8.7*  --  8.1* 8.2*  --   MG 2.0 2.1  --   --   --   --   --  2.2  --  1.9  --   --    < > = values in this interval not displayed.   GFR: Estimated Creatinine Clearance: 84.4 mL/min (by C-G formula based on SCr of 1.17 mg/dL). Recent Labs  Lab 07/12/2020 2100 07/26/20 0300 07/26/20 1409 07/27/20 0304  WBC 5.4 5.0 7.0 8.1    Liver Function Tests: Recent Labs  Lab 07/13/2020 1933 07/26/20 1409 07/27/20 0304  AST 34 26 21  ALT '17 13 14  '$ ALKPHOS 33* 28* 38  BILITOT 2.4* 1.2 1.1  PROT 4.7* 4.9* 5.0*  ALBUMIN 3.6 3.4* 3.1*   No results for input(s): LIPASE, AMYLASE in the last 168 hours. No results for input(s): AMMONIA in the last 168 hours.  ABG    Component Value Date/Time   PHART 7.426 07/27/2020 0853   PCO2ART 37.3 07/27/2020 0853   PO2ART 75 (L) 07/27/2020 0853   HCO3 24.5 07/27/2020 0853   TCO2 26 07/27/2020 0853   ACIDBASEDEF 3.0 (H) 07/26/2020 0314   O2SAT 95.0 07/27/2020 0853     Coagulation Profile: Recent Labs  Lab 07/16/2020 1121 07/15/2020 1617 07/18/2020 1933  INR 1.0 1.4* 1.5*    Cardiac Enzymes: No results for input(s): CKTOTAL, CKMB, CKMBINDEX, TROPONINI in the last 168 hours.  HbA1C: Hgb A1c MFr Bld  Date/Time Value Ref Range Status  07/26/2020 06:28 PM 11.6 (H) 4.8 - 5.6 % Final    Comment:    (NOTE) Pre diabetes:          5.7%-6.4%  Diabetes:              >6.4%  Glycemic  control for   <7.0% adults with diabetes   08/05/2019 03:09 AM 11.6 (H) 4.8 - 5.6 % Final    Comment:    (NOTE) Pre diabetes:          5.7%-6.4% Diabetes:              >6.4% Glycemic control for   <7.0% adults with diabetes     CBG: Recent Labs  Lab 07/27/20 0321 07/27/20 0534 07/27/20 0629 07/27/20 0737 07/27/20 0851  GLUCAP 131* 108* 124* 128* 119*    Review of Systems:   Unable to assess   Past Medical History:  He,  has a past medical history of AAA (abdominal aortic aneurysm) without rupture ([place]) (06/11/2019), Anxiety, Arthritis, Chronic fatigue (01/09/2015), Chronic pain (04/05/2013), COPD (chronic obstructive pulmonary disease) ([place]), CVA (cerebral vascular accident) ([place]), DDD (degenerative disc disease) (10/08/2004), Depression, Diabetic foot infection ([place]) (01/17/2019), Fracture of rib, GERD (gastroesophageal reflux disease), HTN (hypertension), Hypercholesteremia, Hyperlipidemia (06/11/2019), Osteomyelitis ([place]) (08/05/2019), PAD (peripheral artery disease) ([place]) (01/27/2019), Peripheral vascular disease ([place]), PONV (postoperative nausea and vomiting), PVD (peripheral vascular disease) ([place]), Seizure ([place]), Senile nuclear sclerosis (12/06/2011), Sleep apnea, Subacute osteomyelitis of left foot ([place]), and Type 2 diabetes mellitus with other specified complication ([place]).   Surgical History:   Past Surgical History:  Procedure Laterality Date  . ABDOMINAL AORTOGRAM W/LOWER EXTREMITY N/A 01/19/2019   Procedure: ABDOMINAL AORTOGRAM W/LOWER EXTREMITY;  Surgeon: Serafina Mitchell, MD;  Location: [facility];  Service: Cardiovascular;  Laterality: N/A;  . ABDOMINAL AORTOGRAM W/LOWER EXTREMITY Left 06/17/2019   Procedure: ABDOMINAL AORTOGRAM W/LOWER EXTREMITY;  Surgeon: Marty Heck, MD;  Location: [facility];  Service: Cardiovascular;  Laterality: Left;  . AMPUTATION Left 02/22/2019   Procedure: LEFT FOOT 5TH RAY AMPUTATION;  Surgeon: Newt Minion, MD;   Location: [facility];  Service: Orthopedics;  Laterality: Left;  .  APPENDECTOMY    . CORONARY ARTERY BYPASS GRAFT N/A 07/10/2020   Procedure: CORONARY ARTERY BYPASS GRAFTING (CABG) ON PUMP X FIVE  USING LEFT INTERNAL MAMMARY ARTERY AND RIGHT ENDOSCOPIC VEIN HARVEST CONDUITS, INSERTION OF INTRA AORTIC BALLOON PUMP IN RIGHT FEMORAL ARTERY,  RIGHT AXILLARY CANNULATION WITH 10MM HEMASHIELD GRAFT;  Surgeon: Wonda Olds, MD;  Location: [facility];  Service: Open Heart Surgery;  Laterality: N/A;  . ENDARTERECTOMY FEMORAL Left 01/27/2019   Procedure: ENDARTERECTOMY LEFT FEMORAL ARTERY AND PROFUNDA  ARTERY;  Surgeon: Serafina Mitchell, MD;  Location: [facility];  Service: Vascular;  Laterality: Left;  . EXPLORATION POST OPERATIVE OPEN HEART N/A 07/16/2020   Procedure: BEDSIDE EXPLORATION POST OPERATIVE OPEN HEART;  Surgeon: Wonda Olds, MD;  Location: [facility];  Service: Open Heart Surgery;  Laterality: N/A;  . EXPLORATION POST OPERATIVE OPEN HEART N/A 07/19/2020   Procedure: EXPLORATION POST OPERATIVE OPEN HEART;  Surgeon: Wonda Olds, MD;  Location: [facility];  Service: Open Heart Surgery;  Laterality: N/A;  . FEMORAL-POPLITEAL BYPASS GRAFT Left 01/27/2019   Procedure: BYPASS GRAFT FEMORAL-POPLITEAL ARTERY;  Surgeon: Serafina Mitchell, MD;  Location: [facility];  Service: Vascular;  Laterality: Left;  . FINGER AMPUTATION     Left second and third fingers, work accident  . INTRAVASCULAR PRESSURE WIRE/FFR STUDY N/A 06/30/2020   Procedure: INTRAVASCULAR PRESSURE WIRE/FFR STUDY;  Surgeon: Nelva Bush, MD;  Location: [facility];  Service: Cardiovascular;  Laterality: N/A;  . KNEE SURGERY Left   . LOWER EXTREMITY ANGIOGRAM  05/2008   Left SFA atherectomy with diamondback 2.25 utilizing distal protection device NAV6 placed in the left popliteal artery. Angioplasty of the left SFA with ATV balloon 6 x 40   . PATCH ANGIOPLASTY Left 01/27/2019   Procedure: Patch Angioplasty using Vein;  Surgeon: Serafina Mitchell,  MD;  Location: [facility];  Service: Vascular;  Laterality: Left;  . PERIPHERAL VASCULAR BALLOON ANGIOPLASTY Left 06/17/2019   Procedure: PERIPHERAL VASCULAR BALLOON ANGIOPLASTY;  Surgeon: Marty Heck, MD;  Location: [facility];  Service: Cardiovascular;  Laterality: Left;  left proximal and mid bypass  . RIGHT/LEFT HEART CATH AND CORONARY ANGIOGRAPHY N/A 07/13/2020   Procedure: RIGHT/LEFT HEART CATH AND CORONARY ANGIOGRAPHY;  Surgeon: Nelva Bush, MD;  Location: [facility];  Service: Cardiovascular;  Laterality: N/A;  . SHOULDER SURGERY Right   . TEE WITHOUT CARDIOVERSION N/A 07/27/2020   Procedure: TRANSESOPHAGEAL ECHOCARDIOGRAM (TEE);  Surgeon: Wonda Olds, MD;  Location: [facility];  Service: Open Heart Surgery;  Laterality: N/A;  . TRANSMETATARSAL AMPUTATION Left 08/06/2019   Procedure: TRANSMETATARSAL AMPUTATION;  Surgeon: Landis Martins, DPM;  Location: [facility];  Service: Podiatry;  Laterality: Left;  Available after 5pm   . VEIN HARVEST Left 01/27/2019   Procedure: Left Saphenous Vein Harvest;  Surgeon: Serafina Mitchell, MD;  Location: [facility];  Service: Vascular;  Laterality: Left;     Social History:   reports that he quit smoking about 11 years ago. His smoking use included cigarettes. He has a 40.00 pack-year smoking history. He has never used smokeless tobacco. He reports previous alcohol use. He reports that he does not use drugs.   Family History:  His family history includes Cancer in his father and mother.   Allergies No Known Allergies   Home Medications  Prior to Admission medications   Medication Sig Start Date End Date Taking? Authorizing Provider  amLODipine (NORVASC) 10 MG tablet Take 1 tablet (10 mg total) by  mouth daily. 01/21/19  Yes Masoudi, Elhamalsadat, MD  aspirin EC 81 MG tablet Take 81 mg by mouth daily. Swallow whole.   Yes [provider]  clopidogrel (PLAVIX) 75 MG tablet Take 75 mg by mouth daily.   Yes [provider]  DULoxetine (CYMBALTA) 30 MG capsule Take 30 mg by mouth daily.   Yes [provider]  gabapentin (NEURONTIN) 600 MG tablet Take 600 mg by mouth 3 (three) times daily.   Yes [provider]  Insulin Degludec (TRESIBA) 100 UNIT/ML SOLN Inject 62 Units into the skin in the morning. Every morning to control blood sugar   Yes [provider]  levocetirizine (XYZAL) 5 MG tablet Take 5 mg by mouth at bedtime.   Yes [provider]  lisinopril (ZESTRIL) 40 MG tablet Take 40 mg by mouth daily.   Yes [provider]  metFORMIN (GLUCOPHAGE) 1000 MG tablet Take 1,000 mg by mouth 2 (two) times daily with a meal.   Yes [provider]  nitroGLYCERIN (NITROSTAT) 0.4 MG SL tablet Place 0.4 mg under the tongue every 5 (five) minutes as needed for chest pain.   Yes [provider]  pantoprazole (PROTONIX) 40 MG tablet Take 40 mg by mouth daily before breakfast.   Yes [provider]  rosuvastatin (CRESTOR) 20 MG tablet Take 20 mg by mouth daily.   Yes [provider]  Vitamin D, Ergocalciferol, (DRISDOL) 1.25 MG (50000 UNIT) CAPS capsule Take 50,000 Units by mouth 2 (two) times a week.   Yes [provider]     Critical care time:    Performed by: Johnsie Cancel  Total critical care time: 45 minutes  Critical care time was exclusive of separately billable procedures and treating other patients.  Critical care was necessary to treat or prevent imminent or life-threatening deterioration.  Critical care was time spent personally by me on the following activities: development of treatment plan with patient and/or surrogate as well as nursing, discussions with consultants, evaluation of patient's response to treatment, examination of patient, obtaining history from patient or surrogate, ordering and performing treatments and interventions, ordering and review of laboratory studies, ordering and review of radiographic  studies, pulse oximetry and re-evaluation of patient's condition.  Johnsie Cancel, NP-C [facility] Personal contact information can be found on Amion  07/27/2020, 11:04 AM    Pulmonary critical care attending:  This is a 70 year old gentleman past medical history of hypertension, hyperlipidemia, type 2 diabetes, CVA, AAA, left foot amputation presented to the hospital outpatient stress test revealed ejection fraction of 20%.  Patient was taken for right and left heart cath found to have severe three-vessel coronary disease decision was made for quintuple bypass.  Patient Carpenter postoperatively on support with Impella.  Intubated on mechanical life support.  Remains on vasopressors, continuous diuresis with Lasix infusion.  Pulmonary critical care was consulted for recommendations for extubation for mechanical support.  BP 101/90 (BP Location: Right Arm)   Pulse 84   Temp (!) 101.12 F (38.4 C) (Core)   Resp 17   Ht '6\' 1"'$  (1.854 m)   Wt 127 kg   SpO2 91%   BMI 36.94 kg/m   General: Elderly chronically ill-appearing male intubated on mechanical life support critically ill Heart: Midline chest incision, mediastinal drains, regular rhythm S1-S2 Lungs: Biomechanically related breath sounds Chest: Midline incision, Impella Abdomen: Mildly distended Extremities: Left forefoot amputation no significant edema  Labs: Reviewed  Assessment: Acute hypoxemic  respiratory failure requiring intubation mechanical ventilation Cardiogenic shock status post CABG Severe triple-vessel coronary artery disease status post quintuple bypass Anemia, postoperative PAD status post partial left foot amputation.  Plan: Continue IV diuresis with Lasix Hold sedation Hopeful for SBT/SAT. If he was little bit more awake could consider extubation this afternoon. Likely plans for extubation tomorrow morning. Continue digoxin Milrinone [place] + statin   We attempted in the morning at  extubation. And re-rounded again in the afternoon. I he still not ready yet for extubation. We will hold off until to tomorrow afternoon.   Case discussed with Dr. Orvan Carpenter from thoracic surgery.   This patient is critically ill with multiple organ system failure; which, requires frequent high complexity decision making, assessment, support, evaluation, and titration of therapies. This was completed through the application of advanced monitoring technologies and extensive interpretation of multiple databases. During this encounter critical care time was devoted to patient care services described in this note for 50 minutes.  Garner Nash, DO [facility] 07/27/2020 4:35 PM

## 2020-07-27 NOTE — Progress Notes (Addendum)
Patient ID: Danny Carpenter, male   DOB: [birth year], 70 y.o.   MRN: [id number]     Advanced Heart Failure Rounding Note  PCP-Cardiologist: No primary care provider on file.   Subjective:    Good diuresis yesterday, net negative -1968 on Lasix gtt 8 mg/hr.   He remains on epinephrine 3, milrinone 0.375.  Impella at P7.   Per nurse he will awaken.   He remains a-v sequentially paced  Swan numbers: CVP 18 PA 50/26 CI 2.2  Co-ox 69%  Impella P7 Flow 3.9 LHD pending   Objective:   Weight Range: 127 kg Body mass index is 36.94 kg/m.   Vital Signs:   Temp:  [99.68 F (37.6 C)-100.4 F (38 C)] 99.9 F (37.7 C) (04/28 0615) Pulse Rate:  [65-112] 92 (04/28 0615) Resp:  [17-23] 20 (04/28 0615) BP: (85-122)/(70-89) 100/84 (04/28 0600) SpO2:  [66 %-98 %] 97 % (04/28 0615) Arterial Line BP: (64-139)/(57-79) 113/73 (04/28 0615) FiO2 (%):  [55 %-70 %] 60 % (04/28 0401) Last BM Date: 07/23/20  Weight change: Filed Weights   07/24/20 0304 07/15/2020 0522 07/26/20 0500  Weight: 109.6 kg 108.7 kg 127 kg    Intake/Output:   Intake/Output Summary (Last 24 hours) at 07/27/2020 0735 Last data filed at 07/27/2020 0700 Gross per 24 hour  Intake 3361.89 ml  Output 5330 ml  Net -1968.11 ml      Physical Exam    General:  Sedated on vent HEENT: Normal Neck: Thick. JVP 14+. Carotids 2+ bilat; no bruits. No lymphadenopathy or thyromegaly appreciated. Cor: PMI nondisplaced. Regular rate & rhythm. No rubs, gallops or murmurs. Lungs: Decreased at bases Abdomen: Soft, nontender, nondistended. No hepatosplenomegaly. No bruits or masses. Good bowel sounds. Extremities: No cyanosis, clubbing, rash. 1+ edema ankles Neuro: Sedated but will awaken   Telemetry   AV sequential pacing => NSR  Labs    CBC Recent Labs    07/26/20 1409 07/27/20 0304  WBC 7.0 8.1  HGB 9.5* 9.5*  HCT 28.1* 28.1*  MCV 86.2 86.2  PLT 104* 655*   Basic Metabolic Panel Recent Labs    07/26/20 0300  07/26/20 0314 07/26/20 1409 07/27/20 0304  NA 138   < > 139 135  K 4.6   < > 4.2 3.7  CL 105  --  106 102  CO2 24  --  24 25  GLUCOSE 99  --  113* 123*  BUN 18  --  18 18  CREATININE 0.99  --  1.15 1.17  CALCIUM 8.7*  --  8.1* 8.2*  MG 2.2  --  1.9  --    < > = values in this interval not displayed.   Liver Function Tests Recent Labs    07/26/20 1409 07/27/20 0304  AST 26 21  ALT 13 14  ALKPHOS 28* 38  BILITOT 1.2 1.1  PROT 4.9* 5.0*  ALBUMIN 3.4* 3.1*   No results for input(s): LIPASE, AMYLASE in the last 72 hours. Cardiac Enzymes No results for input(s): CKTOTAL, CKMB, CKMBINDEX, TROPONINI in the last 72 hours.  BNP: BNP (last 3 results) Recent Labs    07/08/2020 1211  BNP 465.5*    ProBNP (last 3 results) No results for input(s): PROBNP in the last 8760 hours.   D-Dimer No results for input(s): DDIMER in the last 72 hours. Hemoglobin A1C No results for input(s): HGBA1C in the last 72 hours. Fasting Lipid Panel No results for input(s): CHOL, HDL, LDLCALC, TRIG, CHOLHDL, LDLDIRECT in the last  72 hours. Thyroid Function Tests No results for input(s): TSH, T4TOTAL, T3FREE, THYROIDAB in the last 72 hours.  Invalid input(s): FREET3  Other results:   Imaging    DG Chest Port 1 View  Result Date: 07/27/2020 CLINICAL DATA:  Respiratory failure EXAM: PORTABLE CHEST 1 VIEW COMPARISON:  07/26/2020 FINDINGS: Endotracheal tube seen 5.6 cm above the carina. Nasogastric tube extends into the upper abdomen. Right internal jugular Swan-Ganz catheter tip noted within the main pulmonary artery. Right subclavian left ventricular assist device appears unchanged overlying the expected left ventricular outflow tract. Bilateral chest tubes and mediastinal drain are noted. Lung volumes are small. Pulmonary insufflation has diminished slightly since prior examination. Small bilateral pleural effusions are present. Retrocardiac opacification represents a combination of atelectasis  and posterior layering pleural fluid. No pneumothorax. Mild to moderate cardiomegaly is stable. Trace perihilar pulmonary edema persists, unchanged. Multiple remote left rib fractures noted. IMPRESSION: Stable support lines and tubes. Bilateral chest tubes in place. No pneumothorax. Small bilateral pleural effusions. Stable cardiomegaly. Stable trace perihilar pulmonary edema, likely cardiogenic in nature. Electronically Signed   By: Fidela Salisbury MD   On: 07/27/2020 06:12   ECHOCARDIOGRAM LIMITED  Result Date: 07/26/2020    ECHOCARDIOGRAM LIMITED REPORT   Patient Name:   [facility] Date of Exam: 07/26/2020 Medical Rec #:  [id number]     Height:       73.0 in Accession #:    [id number]    Weight:       280.0 lb Date of Birth:  Aug 25, 1950    BSA:          2.482 m Patient Age:    40 years      BP:           99/76 mmHg Patient Gender: M             HR:           92 bpm. Exam Location:  Inpatient Procedure: Limited Echo Indications:    I50.23 Acute on chronic systolic (congestive) heart failure  History:        Patient has prior history of Echocardiogram examinations, most                 recent 07/02/2020. COPD, Stroke and PAD; Risk                 Factors:Hypertension, Diabetes, Dyslipidemia, Sleep Apnea and                 GERD. Abdominal Aortic Aneurysm.  Sonographer:    Tiffany Dance Referring Phys: [phone] AMY D CLEGG IMPRESSIONS  1. Limited echo to evaluate impella positioning. Impella measures 4.9 cm from aortic valve  2. Left ventricular ejection fraction, by estimation, is <20%. The left ventricle has severely decreased function.  3. Right ventricular systolic function is severely reduced. The right ventricular size is normal. FINDINGS  Left Ventricle: Left ventricular ejection fraction, by estimation, is <20%. The left ventricle has severely decreased function. Right Ventricle: The right ventricular size is normal. Right ventricular systolic function is severely reduced. Oswaldo Milian MD  Electronically signed by Oswaldo Milian MD Signature Date/Time: 07/26/2020/10:48:24 AM    Final       Medications:     Scheduled Medications: . sodium chloride   Intravenous Once  . acetaminophen  1,000 mg Oral Q6H   Or  . acetaminophen (TYLENOL) oral liquid 160 mg/5 mL  1,000 mg Per Tube Q6H  . aspirin EC  325 mg Oral  Daily   Or  . aspirin  324 mg Per Tube Daily  . bisacodyl  10 mg Oral Daily   Or  . bisacodyl  10 mg Rectal Daily  . chlorhexidine gluconate (MEDLINE KIT)  15 mL Mouth Rinse BID  . Chlorhexidine Gluconate Cloth  6 each Topical Daily  . colchicine  0.3 mg Per Tube BID  . docusate  200 mg Per Tube Daily  . gabapentin  600 mg Oral TID  . mouth rinse  15 mL Mouth Rinse 10 times per day  . pantoprazole sodium  40 mg Per Tube Daily  . rosuvastatin  40 mg Per Tube Daily  . sodium chloride flush  3 mL Intravenous Q12H     Infusions: . sodium chloride    . amiodarone 30 mg/hr (07/27/20 0700)  . cefUROXime (ZINACEF)  IV Stopped (07/26/20 2029)  . dexmedetomidine (PRECEDEX) IV infusion 0.7 mcg/kg/hr (07/27/20 0700)  . epinephrine 2 mcg/min (07/27/20 0700)  . furosemide (LASIX) 200 mg in dextrose 5% 100 mL (28m/mL) infusion 8 mg/hr (07/27/20 0700)  . impella catheter heparin 50 unit/mL in dextrose 5%    . insulin 2 mL/hr at 07/27/20 0700  . lactated ringers    . lactated ringers    . lactated ringers 10 mL/hr at 07/27/20 0700  . milrinone 0.375 mcg/kg/min (07/27/20 0700)  . niCARDipine Stopped (07/26/20 0404)  . norepinephrine (LEVOPHED) Adult infusion Stopped (07/26/20 03710  . potassium chloride 50 mL/hr at 07/27/20 0700     PRN Medications:  dextrose, lactated ringers, metoprolol tartrate, midazolam, morphine injection, ondansetron (ZOFRAN) IV, oxyCODONE, sodium chloride flush, traMADol  Assessment/Plan   1. Cardiogenic shock: Ischemic cardiomyopathy, post-CABG on 4/26.  He has Impella 5.5 in place, at P7 with adequate flow and no alarms.  Position  stable by echo.  Limited echo 4/27 with 20%, the RV appears normal in size with moderate systolic dysfunction. CI 2.2 by thermodilution with co-ox 69%.  CVP about 18 today.  Significant volume overload s/p resuscitation from initial bleeding.  Good diuresis yesterday on Lasix gtt 8 mg/hr.  He is on epinephrine 3, milrinone 0.375, off NE.  Creatinine normal range.  - Continue Lasix gtt 8 mg/hr, can increase as needed to keep him net negative.   - Wean epinephrine today.  - Continue milrinone 0.375.   - Continue Impella at P7. Has heparin in purge but not systemic heparin given recent post-op bleeding => will check with surgery to see if stable to use systemic heparin.  - Pacing turned down, patient is in NSR => left off pacing for now.  - Start digoxin 2. CAD: s/p CABG x 5 with LIMA-LAD, seq SVG-D1/ramus, seq SVG-PDA/PLV.   - ASA - Crestor 3. Anemia: Post-op bleeding, back to OR with multiple products given on 4/26 post-CABG.  Hgb stable at 9.5 today.  4. Thrombocytopenia: Plts 114K today, suspect low post-op/post-surgical bleeding and multiple blood products.  - Follow.  5. PAD: Extensive history.  6. AAA: Monitoring as outpatient.  7. Type 2 DM: Insulin.  Hgb A1c was 11.6, poor control.  8. Rhythm: NSR today on amiodarone gtt.  9. Neuro: Wakes up and follows commands.  9. ID: Tm 100.4, WBCs normal.  CXR no PNA.  - Send blood/trach aspirate cultures.   CRITICAL CARE Performed by: DLoralie Champagne Total critical care time: 35 minutes  Critical care time was exclusive of separately billable procedures and treating other patients.  Critical care was necessary to treat or prevent imminent or  life-threatening deterioration.  Critical care was time spent personally by me on the following activities: development of treatment plan with patient and/or surrogate as well as nursing, discussions with consultants, evaluation of patient's response to treatment, examination of patient, obtaining history  from patient or surrogate, ordering and performing treatments and interventions, ordering and review of laboratory studies, ordering and review of radiographic studies, pulse oximetry and re-evaluation of patient's condition.   Length of Stay: 7  Loralie Champagne, MD  07/27/2020, 7:35 AM  Advanced Heart Failure Team Pager [phone] (M-F; 7a - 5p)  Please contact [facility] for night-coverage after hours (5p -7a ) and weekends on [email or web address]

## 2020-07-27 NOTE — Progress Notes (Signed)
[place] for Heparin Indication: Impella 5.5  No Known Allergies  Patient Measurements: Height: '6\' 1"'$  (185.4 cm) Weight: 127 kg (279 lb 15.8 oz) IBW/kg (Calculated) : 79.9 Heparin Dosing Weight: 102.8 kg  Vital Signs: Temp: 99.9 F (37.7 C) (04/28 0615) BP: 100/84 (04/28 0600) Pulse Rate: 92 (04/28 0615)  Labs: Recent Labs    07/22/2020 1617 07/10/2020 1744 07/15/2020 1933 07/11/2020 2100 07/26/20 0300 07/26/20 0314 07/26/20 1400 07/26/20 1409 07/27/20 0304 07/27/20 0305  HGB 9.2*   < > 10.5*   < > 10.1* 10.2*  --  9.5* 9.5*  --   HCT 28.9*   < > 31.0*   < > 29.7* 30.0*  --  28.1* 28.1*  --   PLT 179  --   --    < > 110*  --   --  104* 114*  --   APTT 99*  --  41*  --   --   --   --   --   --   --   LABPROT 17.1*  --  17.9*  --   --   --   --   --   --   --   INR 1.4*  --  1.5*  --   --   --   --   --   --   --   HEPARINUNFRC  --   --   --   --   --   --  <0.10*  --   --  <0.10*  CREATININE  --    < > 0.94  --  0.99  --   --  1.15 1.17  --    < > = values in this interval not displayed.    Estimated Creatinine Clearance: 84.4 mL/min (by C-G formula based on SCr of 1.17 mg/dL).   Medical History: Past Medical History:  Diagnosis Date  . AAA (abdominal aortic aneurysm) without rupture ([place]) 06/11/2019   infrarenal   Last Assessment & Plan:  Uptodate.  Stable at 4.1cm Repeat visit to vascular surg in 12/2015.  Marland Kitchen Anxiety   . Arthritis   . Chronic fatigue 01/09/2015   Last Assessment & Plan:  Chronic fatigue. Suspect multifactorial including OSA, hyperglycemia (patient not taking insulin as prescribed), pain medications. No other constitutional sx.  Will plan for repeat lab work.   - follow up in 1 mth after sleep study results.  . Chronic pain 04/05/2013   Last Assessment & Plan:   Known Degenerative disc disease and bilateral peripheral neuropathic leg pain   continue cymbalta '60mg'$   reports has trialed injections in past as well as   '5mg'$  hydrocodone with no success. Also tried tramadol in the past with no benefit.  Prescribed Norco 7.5/'325mg'$  Hydrocodone/ Acetaminophen 1-1.5 tabs every 8 hours prn #150. 1 months through 04/17/14  UTox 06/14/14 app  . COPD (chronic obstructive pulmonary disease) ([place])   . CVA (cerebral vascular accident) ([place])   . DDD (degenerative disc disease) 10/08/2004  . Depression   . Diabetic foot infection ([place]) 01/17/2019  . Fracture of rib    Left  . GERD (gastroesophageal reflux disease)   . HTN (hypertension)   . Hypercholesteremia   . Hyperlipidemia 06/11/2019   On Lipitor '80mg'$  per day  Last Assessment & Plan:  PLAN: HLD continue Lipitor '80mg'$  daily  . Osteomyelitis ([place]) 08/05/2019   left foot  . PAD (peripheral artery disease) ([place]) 01/27/2019  . Peripheral vascular disease ([place])   .  PONV (postoperative nausea and vomiting)   . PVD (peripheral vascular disease) ([place])   . Seizure [facility])    one time in Oct. 2020  . Senile nuclear sclerosis 12/06/2011  . Sleep apnea    no cpap  . Subacute osteomyelitis of left foot ([place])   . Type 2 diabetes mellitus with other specified complication [facility])     Assessment: 70 yo M presents with NSTEMI and multivessel CAD, now s/p CABG with Impella support. Post-CABG had cardiac arrest with no flow on Impella. CPR initiated and chest opened at bedside and returned to OR for exploration given significant bleeding. Heparin purge solution held overnight. Received multiple blood products. Pharmacy asked to start heparin purge the next morning.   Impella at P7, purge flow of 11.5 ml/hr (~575 units/hr heparin). Heparin level remains undetectable this morning. Hgb 9.5, pltc 114. Chest tube output 790 ml/24 hr. Discussed with Dr. Orvan Seen, will not start systemic heparin at this time.  Goal of Therapy:  Heparin level < 0.3 Monitor platelets by anticoagulation protocol: Yes   Plan:  Continue heparin purge solution (50 u/ml) Monitor daily HL, CBC, s/sx  bleeding  Richardine Service, PharmD, BCPS PGY2 Cardiology Pharmacy Resident Phone: [phone] 07/27/2020  7:30 AM  Please check [email or web address] for unit-specific pharmacy phone numbers.

## 2020-07-27 NOTE — Addendum Note (Signed)
Addendum  created 07/27/20 0859 by Josephine Igo, CRNA   Order list changed, Pharmacy for encounter modified

## 2020-07-28 ENCOUNTER — Inpatient Hospital Stay (HOSPITAL_COMMUNITY): Payer: No Typology Code available for payment source

## 2020-07-28 DIAGNOSIS — I5040 Unspecified combined systolic (congestive) and diastolic (congestive) heart failure: Secondary | ICD-10-CM

## 2020-07-28 DIAGNOSIS — I5023 Acute on chronic systolic (congestive) heart failure: Secondary | ICD-10-CM | POA: Diagnosis not present

## 2020-07-28 DIAGNOSIS — I214 Non-ST elevation (NSTEMI) myocardial infarction: Secondary | ICD-10-CM | POA: Diagnosis not present

## 2020-07-28 DIAGNOSIS — R570 Cardiogenic shock: Secondary | ICD-10-CM | POA: Diagnosis not present

## 2020-07-28 LAB — CBC
HCT: 29 % — ABNORMAL LOW (ref 39.0–52.0)
HCT: 30.5 % — ABNORMAL LOW (ref 39.0–52.0)
Hemoglobin: 9.7 g/dL — ABNORMAL LOW (ref 13.0–17.0)
Hemoglobin: 9.8 g/dL — ABNORMAL LOW (ref 13.0–17.0)
MCH: 30 pg (ref 26.0–34.0)
MCH: 28.7 pg (ref 26.0–34.0)
MCHC: 33.4 g/dL (ref 30.0–36.0)
MCHC: 32.1 g/dL (ref 30.0–36.0)
MCV: 89.8 fL (ref 80.0–100.0)
MCV: 89.2 fL (ref 80.0–100.0)
Platelets: 110 10*3/uL — ABNORMAL LOW (ref 150–400)
Platelets: 104 10*3/uL — ABNORMAL LOW (ref 150–400)
RBC: 3.23 MIL/uL — ABNORMAL LOW (ref 4.22–5.81)
RBC: 3.42 MIL/uL — ABNORMAL LOW (ref 4.22–5.81)
RDW: 15.7 % — ABNORMAL HIGH (ref 11.5–15.5)
RDW: 16.5 % — ABNORMAL HIGH (ref 11.5–15.5)
WBC: 9.6 10*3/uL (ref 4.0–10.5)
WBC: 6.4 10*3/uL (ref 4.0–10.5)
nRBC: 0 % (ref 0.0–0.2)
nRBC: 0.3 % — ABNORMAL HIGH (ref 0.0–0.2)

## 2020-07-28 LAB — GLUCOSE, CAPILLARY
Glucose-Capillary: 216 mg/dL — ABNORMAL HIGH (ref 70–99)
Glucose-Capillary: 174 mg/dL — ABNORMAL HIGH (ref 70–99)
Glucose-Capillary: 153 mg/dL — ABNORMAL HIGH (ref 70–99)
Glucose-Capillary: 125 mg/dL — ABNORMAL HIGH (ref 70–99)
Glucose-Capillary: 138 mg/dL — ABNORMAL HIGH (ref 70–99)
Glucose-Capillary: 170 mg/dL — ABNORMAL HIGH (ref 70–99)

## 2020-07-28 LAB — TYPE AND SCREEN
ABO/RH(D): A POS
Antibody Screen: NEGATIVE
Unit division: 0
Unit division: 0
Unit division: 0
Unit division: 0
Unit division: 0
Unit division: 0
Unit division: 0
Unit division: 0
Unit division: 0
Unit division: 0
Unit division: 0
Unit division: 0
Unit division: 0

## 2020-07-28 LAB — POCT I-STAT 7, (LYTES, BLD GAS, ICA,H+H)
Acid-base deficit: 3 mmol/L — ABNORMAL HIGH (ref 0.0–2.0)
Bicarbonate: 22.3 mmol/L (ref 20.0–28.0)
Calcium, Ion: 1.1 mmol/L — ABNORMAL LOW (ref 1.15–1.40)
HCT: 29 % — ABNORMAL LOW (ref 39.0–52.0)
Hemoglobin: 9.9 g/dL — ABNORMAL LOW (ref 13.0–17.0)
O2 Saturation: 90 %
Patient temperature: 38.7
Potassium: 3.5 mmol/L (ref 3.5–5.1)
Sodium: 139 mmol/L (ref 135–145)
TCO2: 23 mmol/L (ref 22–32)
pCO2 arterial: 42.8 mmHg (ref 32.0–48.0)
pH, Arterial: 7.332 — ABNORMAL LOW (ref 7.350–7.450)
pO2, Arterial: 68 mmHg — ABNORMAL LOW (ref 83.0–108.0)

## 2020-07-28 LAB — BPAM RBC
Blood Product Expiration Date: 202205202359
Blood Product Expiration Date: 202205202359
Blood Product Expiration Date: 202205222359
Blood Product Expiration Date: 202205222359
Blood Product Expiration Date: 202205222359
Blood Product Expiration Date: 202205222359
Blood Product Expiration Date: 202205222359
Blood Product Expiration Date: 202205222359
Blood Product Expiration Date: 202205222359
Blood Product Expiration Date: 202205222359
Blood Product Expiration Date: 202205232359
Blood Product Expiration Date: 202205232359
Blood Product Expiration Date: 202205232359
ISSUE DATE / TIME: 202204261404
ISSUE DATE / TIME: 202204261628
ISSUE DATE / TIME: 202204261628
ISSUE DATE / TIME: 202204261628
ISSUE DATE / TIME: 202204261628
ISSUE DATE / TIME: 202204261710
ISSUE DATE / TIME: 202204261710
ISSUE DATE / TIME: 202204261710
ISSUE DATE / TIME: 202204261710
ISSUE DATE / TIME: 202204261728
ISSUE DATE / TIME: 202204261728
Unit Type and Rh: 5100
Unit Type and Rh: 5100
Unit Type and Rh: 5100
Unit Type and Rh: 5100
Unit Type and Rh: 6200
Unit Type and Rh: 6200
Unit Type and Rh: 6200
Unit Type and Rh: 6200
Unit Type and Rh: 6200
Unit Type and Rh: 6200
Unit Type and Rh: 6200
Unit Type and Rh: 6200
Unit Type and Rh: 6200

## 2020-07-28 LAB — BASIC METABOLIC PANEL
Anion gap: 10 (ref 5–15)
Anion gap: 9 (ref 5–15)
BUN: 23 mg/dL (ref 8–23)
BUN: 29 mg/dL — ABNORMAL HIGH (ref 8–23)
CO2: 24 mmol/L (ref 22–32)
CO2: 25 mmol/L (ref 22–32)
Calcium: 8 mg/dL — ABNORMAL LOW (ref 8.9–10.3)
Calcium: 7.7 mg/dL — ABNORMAL LOW (ref 8.9–10.3)
Chloride: 101 mmol/L (ref 98–111)
Chloride: 102 mmol/L (ref 98–111)
Creatinine, Ser: 1.24 mg/dL (ref 0.61–1.24)
Creatinine, Ser: 1.6 mg/dL — ABNORMAL HIGH (ref 0.61–1.24)
GFR, Estimated: >60 mL/min (ref >60)
GFR, Estimated: 47 mL/min — ABNORMAL LOW (ref >60)
Glucose, Bld: 227 mg/dL — ABNORMAL HIGH (ref 70–99)
Glucose, Bld: 117 mg/dL — ABNORMAL HIGH (ref 70–99)
Potassium: 3.7 mmol/L (ref 3.5–5.1)
Potassium: 3.7 mmol/L (ref 3.5–5.1)
Sodium: 135 mmol/L (ref 135–145)
Sodium: 136 mmol/L (ref 135–145)

## 2020-07-28 LAB — MAGNESIUM
Magnesium: 1.8 mg/dL (ref 1.7–2.4)
Magnesium: 2.6 mg/dL — ABNORMAL HIGH (ref 1.7–2.4)

## 2020-07-28 LAB — TRIGLYCERIDES: Triglycerides: 168 mg/dL — ABNORMAL HIGH (ref <150)

## 2020-07-28 LAB — COOXEMETRY PANEL
Carboxyhemoglobin: 1.2 % (ref 0.5–1.5)
Carboxyhemoglobin: 1.4 % (ref 0.5–1.5)
Methemoglobin: 1.2 % (ref 0.0–1.5)
Methemoglobin: 1.2 % (ref 0.0–1.5)
O2 Saturation: 45.2 %
O2 Saturation: 57 %
Total hemoglobin: 9.8 g/dL — ABNORMAL LOW (ref 12.0–16.0)
Total hemoglobin: 9.9 g/dL — ABNORMAL LOW (ref 12.0–16.0)

## 2020-07-28 LAB — HEPARIN LEVEL (UNFRACTIONATED)
Heparin Unfractionated: <0.1 IU/mL — ABNORMAL LOW (ref 0.30–0.70)
Heparin Unfractionated: <0.1 IU/mL — ABNORMAL LOW (ref 0.30–0.70)
Heparin Unfractionated: <0.1 IU/mL — ABNORMAL LOW (ref 0.30–0.70)

## 2020-07-28 LAB — ECHOCARDIOGRAM LIMITED
Height: 73 in
Weight: 4359.82 oz

## 2020-07-28 LAB — LACTATE DEHYDROGENASE
LDH: 209 U/L — ABNORMAL HIGH (ref 98–192)
LDH: 247 U/L — ABNORMAL HIGH (ref 98–192)

## 2020-07-28 LAB — PREPARE RBC (CROSSMATCH)

## 2020-07-28 LAB — PROCALCITONIN: Procalcitonin: 4.06 ng/mL

## 2020-07-28 LAB — PHOSPHORUS: Phosphorus: 4.2 mg/dL (ref 2.5–4.6)

## 2020-07-28 LAB — LACTIC ACID, PLASMA: Lactic Acid, Venous: 1 mmol/L (ref 0.5–1.9)

## 2020-07-28 MED ORDER — ACETAMINOPHEN 160 MG/5ML PO SOLN
1000.0000 mg | Freq: Four times a day (QID) | ORAL | Status: AC
  Administered 2020-07-29 – 2020-07-31 (×9): 1000 mg
  Filled 2020-07-28 (×9): qty 40.6

## 2020-07-28 MED ORDER — AMIODARONE HCL 200 MG PO TABS
200.0000 mg | ORAL_TABLET | Freq: Two times a day (BID) | ORAL | Status: DC
  Administered 2020-07-28: 200 mg via ORAL
  Filled 2020-07-28: qty 1

## 2020-07-28 MED ORDER — NOREPINEPHRINE 4 MG/250ML-% IV SOLN
0.0000 ug/min | INTRAVENOUS | Status: DC
  Administered 2020-07-28 – 2020-07-29 (×2): 4 ug/min via INTRAVENOUS
  Administered 2020-07-29: 5.013 ug/min via INTRAVENOUS
  Administered 2020-07-30: 2 ug/min via INTRAVENOUS
  Filled 2020-07-28 (×3): qty 250

## 2020-07-28 MED ORDER — SODIUM CHLORIDE 0.9% IV SOLUTION: Freq: Once | INTRAVENOUS | Status: DC

## 2020-07-28 MED ORDER — VANCOMYCIN HCL 1750 MG/350ML IV SOLN
1750.0000 mg | INTRAVENOUS | Status: DC
  Administered 2020-07-29: 1750 mg via INTRAVENOUS
  Filled 2020-07-28 (×2): qty 350

## 2020-07-28 MED ORDER — ACETAMINOPHEN 10 MG/ML IV SOLN
1000.0000 mg | Freq: Four times a day (QID) | INTRAVENOUS | Status: AC
  Administered 2020-07-28 – 2020-07-29 (×4): 1000 mg via INTRAVENOUS
  Filled 2020-07-28 (×4): qty 100

## 2020-07-28 MED ORDER — POLYETHYLENE GLYCOL 3350 17 G PO PACK
17.0000 g | PACK | Freq: Every day | ORAL | Status: DC
  Administered 2020-07-31 – 2020-08-09 (×8): 17 g
  Filled 2020-07-28 (×8): qty 1

## 2020-07-28 MED ORDER — HEPARIN (PORCINE) 25000 UT/250ML-% IV SOLN
1550.0000 [IU]/h | INTRAVENOUS | Status: DC
  Administered 2020-07-28: 100 [IU]/h via INTRAVENOUS
  Administered 2020-07-30: 700 [IU]/h via INTRAVENOUS
  Administered 2020-07-31: 1000 [IU]/h via INTRAVENOUS
  Administered 2020-08-01: 1100 [IU]/h via INTRAVENOUS
  Administered 2020-08-02 – 2020-08-03 (×2): 1200 [IU]/h via INTRAVENOUS
  Administered 2020-08-04 (×2): 1350 [IU]/h via INTRAVENOUS
  Administered 2020-08-05 – 2020-08-06 (×2): 1450 [IU]/h via INTRAVENOUS
  Administered 2020-08-07 – 2020-08-08 (×2): 1500 [IU]/h via INTRAVENOUS
  Filled 2020-07-28 (×13): qty 250

## 2020-07-28 MED ORDER — VITAL 1.5 CAL PO LIQD
1000.0000 mL | ORAL | Status: DC
  Administered 2020-07-28 – 2020-07-29 (×2): 1000 mL
  Administered 2020-07-29: 40 mL
  Administered 2020-07-30 – 2020-08-22 (×18): 1000 mL
  Filled 2020-07-28 (×7): qty 1000

## 2020-07-28 MED ORDER — ALBUMIN HUMAN 5 % IV SOLN
25.0000 g | Freq: Once | INTRAVENOUS | Status: AC
  Administered 2020-07-28: 25 g via INTRAVENOUS

## 2020-07-28 MED ORDER — MILRINONE LACTATE IN DEXTROSE 20-5 MG/100ML-% IV SOLN
0.1250 ug/kg/min | INTRAVENOUS | Status: DC
  Administered 2020-07-28 – 2020-07-31 (×10): 0.25 ug/kg/min via INTRAVENOUS
  Filled 2020-07-28 (×9): qty 100

## 2020-07-28 MED ORDER — ALBUMIN HUMAN 5 % IV SOLN
INTRAVENOUS | Status: AC
  Filled 2020-07-28: qty 250

## 2020-07-28 MED ORDER — MAGNESIUM SULFATE 4 GM/100ML IV SOLN
4.0000 g | Freq: Once | INTRAVENOUS | Status: AC
  Administered 2020-07-28: 4 g via INTRAVENOUS
  Filled 2020-07-28: qty 100

## 2020-07-28 MED ORDER — GABAPENTIN 250 MG/5ML PO SOLN
600.0000 mg | Freq: Three times a day (TID) | ORAL | Status: DC
  Administered 2020-07-28 – 2020-07-29 (×4): 600 mg
  Filled 2020-07-28 (×5): qty 12

## 2020-07-28 MED ORDER — SODIUM CHLORIDE 0.9 % IV SOLN
INTRAVENOUS | Status: DC | PRN
  Administered 2020-07-28 – 2020-07-31 (×2): 250 mL via INTRAVENOUS
  Administered 2020-08-15: 1000 mL via INTRAVENOUS
  Administered 2020-08-16: 500 mL via INTRAVENOUS

## 2020-07-28 MED ORDER — VASOPRESSIN 20 UNITS/100 ML INFUSION FOR SHOCK
0.0000 [IU]/min | INTRAVENOUS | Status: DC
  Administered 2020-07-28 – 2020-07-30 (×6): 0.03 [IU]/min via INTRAVENOUS
  Administered 2020-07-31: 0.02 [IU]/min via INTRAVENOUS
  Filled 2020-07-28 (×6): qty 100

## 2020-07-28 MED ORDER — AMIODARONE HCL 200 MG PO TABS
200.0000 mg | ORAL_TABLET | Freq: Two times a day (BID) | ORAL | Status: DC
  Administered 2020-07-28 – 2020-08-05 (×16): 200 mg
  Filled 2020-07-28 (×16): qty 1

## 2020-07-28 MED ORDER — POTASSIUM CHLORIDE 10 MEQ/50ML IV SOLN
10.0000 meq | INTRAVENOUS | Status: AC
  Administered 2020-07-28 (×3): 10 meq via INTRAVENOUS
  Filled 2020-07-28 (×3): qty 50

## 2020-07-28 MED ORDER — VANCOMYCIN HCL 10 G IV SOLR
2500.0000 mg | Freq: Once | INTRAVENOUS | Status: AC
  Administered 2020-07-28: 2500 mg via INTRAVENOUS
  Filled 2020-07-28: qty 2500

## 2020-07-28 MED ORDER — ACETAMINOPHEN 500 MG PO TABS
1000.0000 mg | ORAL_TABLET | Freq: Four times a day (QID) | ORAL | Status: AC
  Filled 2020-07-28: qty 2

## 2020-07-28 MED ORDER — PROSOURCE TF PO LIQD
45.0000 mL | Freq: Four times a day (QID) | ORAL | Status: DC
  Administered 2020-07-28 – 2020-08-08 (×43): 45 mL
  Filled 2020-07-28 (×40): qty 45

## 2020-07-28 MED ORDER — ALBUMIN HUMAN 5 % IV SOLN: 25.0000 g | Freq: Once | INTRAVENOUS | Status: AC

## 2020-07-28 MED ORDER — ALBUMIN HUMAN 5 % IV SOLN
INTRAVENOUS | Status: AC
  Administered 2020-07-28: 25 g via INTRAVENOUS
  Filled 2020-07-28: qty 500

## 2020-07-28 MED ORDER — SODIUM CHLORIDE 0.9 % IV SOLN
2.0000 g | Freq: Three times a day (TID) | INTRAVENOUS | Status: DC
  Administered 2020-07-28 – 2020-07-29 (×4): 2 g via INTRAVENOUS
  Filled 2020-07-28 (×4): qty 2

## 2020-07-28 MED ORDER — PLASMA-LYTE A IV SOLN: INTRAVENOUS | Status: DC

## 2020-07-28 NOTE — Progress Notes (Signed)
Pharmacy Antibiotic Note  Danny Carpenter is a 70 y.o. male admitted on 07/28/2020 with NSTEMI s/p CABG with Impella support. Now with concerns for  pneumonia.  Pharmacy has been consulted for Vancomycin and Cefepime dosing. WBC wnl, febrile 101.7 trending up. Scr 1.24 stable, CrCl 78 ml/min.   Plan: Vancomycin 2500 mg IV once, then 1750 mg IV q24 hrs (est AUC 489, Vd 0.5, Scr 1.24) Cefepime 2 gm IV q8 hrs Monitor renal function, cultures/sensitivities, clinical progression   Height: '6\' 1"'$  (185.4 cm) Weight: 123.6 kg (272 lb 7.8 oz) IBW/kg (Calculated) : 79.9  Temp (24hrs), Avg:99.7 F (37.6 C), Min:97.5 F (36.4 C), Max:101.7 F (38.7 C)  Recent Labs  Lab 07/12/2020 1933 07/17/2020 2100 07/26/20 0300 07/26/20 1409 07/27/20 0304 07/28/20 0322  WBC  --  5.4 5.0 7.0 8.1 9.6  CREATININE 0.94  --  0.99 1.15 1.17 1.24    Estimated Creatinine Clearance: 78.5 mL/min (by C-G formula based on SCr of 1.24 mg/dL).    No Known Allergies  Antimicrobials this admission: Vanc 4/26 x2, 4/29 >>  Cefuroxime 4/26 >> 4/27 Cefepime 4/29>>  Dose adjustments this admission: N/A  Microbiology results: 4/28 BCx:   4/28 Sputum:   4/25 MRSA PCR: neg  Richardine Service, PharmD, BCPS PGY2 Cardiology Pharmacy Resident Phone: [phone] 07/28/2020  8:07 AM  Please check [email or web address] for unit-specific pharmacy phone numbers.

## 2020-07-28 NOTE — Progress Notes (Signed)
[place] for Heparin Indication: Impella 5.5  No Known Allergies  Patient Measurements: Height: '6\' 1"'$  (185.4 cm) Weight: 123.6 kg (272 lb 7.8 oz) IBW/kg (Calculated) : 79.9 Heparin Dosing Weight: 102.8 kg  Vital Signs: Temp: 101.3 F (38.5 C) (04/29 0700) BP: 94/75 (04/29 0600) Pulse Rate: 74 (04/29 0700)  Labs: Recent Labs    07/19/2020 1617 07/24/2020 1744 07/26/2020 1933 07/13/2020 2100 07/26/20 1400 07/26/20 1409 07/27/20 0304 07/27/20 0305 07/27/20 0853 07/28/20 0322 07/28/20 0614  HGB 9.2*   < > 10.5*   < >  --  9.5* 9.5*  --  9.2* 9.7* 9.9*  HCT 28.9*   < > 31.0*   < >  --  28.1* 28.1*  --  27.0* 29.0* 29.0*  PLT 179  --   --    < >  --  104* 114*  --   --  110*  --   APTT 99*  --  41*  --   --   --   --   --   --   --   --   LABPROT 17.1*  --  17.9*  --   --   --   --   --   --   --   --   INR 1.4*  --  1.5*  --   --   --   --   --   --   --   --   HEPARINUNFRC  --   --   --   --  <0.10*  --   --  <0.10*  --  <0.10*  --   CREATININE  --    < > 0.94   < >  --  1.15 1.17  --   --  1.24  --    < > = values in this interval not displayed.    Estimated Creatinine Clearance: 78.5 mL/min (by C-G formula based on SCr of 1.24 mg/dL).   Medical History: Past Medical History:  Diagnosis Date  . AAA (abdominal aortic aneurysm) without rupture ([place]) 06/11/2019   infrarenal   Last Assessment & Plan:  Uptodate.  Stable at 4.1cm Repeat visit to vascular surg in 12/2015.  Marland Kitchen Anxiety   . Arthritis   . Chronic fatigue 01/09/2015   Last Assessment & Plan:  Chronic fatigue. Suspect multifactorial including OSA, hyperglycemia (patient not taking insulin as prescribed), pain medications. No other constitutional sx.  Will plan for repeat lab work.   - follow up in 1 mth after sleep study results.  . Chronic pain 04/05/2013   Last Assessment & Plan:   Known Degenerative disc disease and bilateral peripheral neuropathic leg pain   continue cymbalta  '60mg'$   reports has trialed injections in past as well as  '5mg'$  hydrocodone with no success. Also tried tramadol in the past with no benefit.  Prescribed Norco 7.5/'325mg'$  Hydrocodone/ Acetaminophen 1-1.5 tabs every 8 hours prn #150. 1 months through 04/17/14  UTox 06/14/14 app  . COPD (chronic obstructive pulmonary disease) ([place])   . CVA (cerebral vascular accident) ([place])   . DDD (degenerative disc disease) 10/08/2004  . Depression   . Diabetic foot infection ([place]) 01/17/2019  . Fracture of rib    Left  . GERD (gastroesophageal reflux disease)   . HTN (hypertension)   . Hypercholesteremia   . Hyperlipidemia 06/11/2019   On Lipitor '80mg'$  per day  Last Assessment & Plan:  PLAN: HLD continue Lipitor '80mg'$  daily  .  Osteomyelitis ([place]) 08/05/2019   left foot  . PAD (peripheral artery disease) ([place]) 01/27/2019  . Peripheral vascular disease ([place])   . PONV (postoperative nausea and vomiting)   . PVD (peripheral vascular disease) ([place])   . Seizure [facility])    one time in Oct. 2020  . Senile nuclear sclerosis 12/06/2011  . Sleep apnea    no cpap  . Subacute osteomyelitis of left foot ([place])   . Type 2 diabetes mellitus with other specified complication [facility])     Assessment: 70 yo M presents with NSTEMI and multivessel CAD, now s/p CABG with Impella support. Post-CABG had cardiac arrest with no flow on Impella. CPR initiated and chest opened at bedside and returned to OR for exploration given significant bleeding. Heparin purge solution held overnight. Received multiple blood products. Pharmacy asked to start heparin purge the next morning.  Impella at P6 going back up to P7 today. Purge flow of 11.4 ml/hr (~570 units/hr heparin). Heparin level remains undetectable this morning. Hgb 9.7, pltc 110. Chest tube output down 110 ml/24 hr. Discussed with Dr. Orvan Seen and Dr. Aundra Dubin, will start systemic heparin very slowly and target lower goal for now.  Goal of Therapy:  Heparin level 0.2-0.3 Monitor platelets  by anticoagulation protocol: Yes   Plan:  Continue heparin purge solution (50 u/ml) Start IV heparin 100 units/hr Check HL Q6 hrs for first 24 hrs Monitor daily HL, CBC, s/sx bleeding  Richardine Service, PharmD, BCPS PGY2 Cardiology Pharmacy Resident Phone: [phone] 07/28/2020  7:18 AM  Please check [email or web address] for unit-specific pharmacy phone numbers.

## 2020-07-28 NOTE — Consult Note (Addendum)
NAMEDavio Carpenter, MRN:  [id number], DOB:  17-Mar-1951, LOS: 8 ADMISSION DATE:  07/08/2020, CONSULTATION DATE: 07/28/2018 REFERRING MD: Dr. Orvan Seen, CHIEF COMPLAINT: Ventilator dependent  History of Present Illness:  Danny Carpenter is a 70 year old male with past medical history significant for hypertension, hyperlipidemia, type 2 diabetes, CVA, AAA, left foot amputation s/p fem pop bypass and heart failure who presented to the emergency department 4/21 after undergoing outpatient stress test that revealed EF of 20% and acute ischemia.  Since admission patient has been evaluated by cardiology and cardiothoracic surgery and patient underwent TEE and left heart cath.  LHC/RHC revealed severe three-vessel coronary artery disease with moderately elevated left heart and pulmonary artery pressures.  At that time patient was placed on goal-directed medical therapy for acute systolic congestive heart failure and CTS surgery was consulted for CABG potential.  Patient underwent CABG x5 with Dr. Orvan Seen 4/26, post CABG CODE BLUE called due to low flow on Impella, CTS notified.  Patient underwent bedside exploratory postoperative open heart where Dr. Julien Girt reopened the chest incision and hematoma was removed, patient's hemodynamics improved upon opening sternotomy.  PCCM consulted morning of 4/28 due to difficulty weaning ventilator  Pertinent  Medical History  Hypertension, hyperlipidemia, type 2 diabetes, CVA, AAA, left foot amputation s/p fem pop bypass and heart failure   Significant Hospital Events: Including procedures, antibiotic start and stop dates in addition to other pertinent events   . 4/21 presented to the ED for evaluation of abnormal stress test . 4/22 left and right heart cath severe multivessel disease . 4/26 CABG x5 with hemodynamic instability post requiring brief CPR with eventual return back to the OR anastomosis bleeding was seen . 4/28 remains ventilator dependent, slowly weaning  PEEP  Interim History / Subjective:  Getting an ECHO this morning to verify impella placement Urine is dark but adequate output and stable renal indices  Epi 1 NE 4 Milrinone 0.375 impella P6   Temp > 101  Objective   Blood pressure 104/74, pulse 88, temperature (!) 101.3 F (38.5 C), resp. rate (!) 24, height '6\' 1"'$  (1.854 m), weight 123.6 kg, SpO2 93 %. PAP: (38-64)/(21-32) 55/30 CVP:  [11 mmHg-23 mmHg] 20 mmHg CO:  [4.6 L/min-7.3 L/min] 6.8 L/min CI:  [2 L/min/m2-3.2 L/min/m2] 2.9 L/min/m2  Vent Mode: PSV;CPAP FiO2 (%):  [40 %] 40 % Set Rate:  [20 bmp] 20 bmp Vt Set:  [630 mL] 630 mL PEEP:  [5 cmH20] 5 cmH20 Pressure Support:  [5 cmH20-10 cmH20] 8 cmH20 Plateau Pressure:  [16 cmH20-27 cmH20] 16 cmH20   Intake/Output Summary (Last 24 hours) at 07/28/2020 1011 Last data filed at 07/28/2020 1000 Gross per 24 hour  Intake 1928.3 ml  Output 4135 ml  Net -2206.7 ml   Filed Weights   07/23/2020 0522 07/26/20 0500 07/28/20 0452  Weight: 108.7 kg 127 kg 123.6 kg    Examination: General: Chronically and critically ill appearing adult M, intubated, getting ECHO NAD  HEENT: NCAT pink mm ETT secure Neuro: Off sedation. Lethargic. Following simple commands CV: rrr  impella , cap refill < 3 sec  PULM: Symmetrical chest expansion, even and unlabored. CTA  GI: Soft round ndnt  Extremities: No acute joint deformity, no cyanosis or clubbing  Skin: c/d/w no rash   Labs/imaging that I have personally reviewed    4/27 Limited echo to evaluate Impella, good positioning, EF less than 20%  4/29 CBC Plt 110 Hgb 9.7 BMP. Coox 57. CXR with stable support devices, R pleural  effusion and some pulmonary edema   Resolved Hospital Problem list     Assessment & Plan:    Acute hypoxic respiratory failure  R pleural effusion, mild pulmonary edema  -Secondary to cardiogenic shock status post CABG P: WUA/SBT Wean as able, goal to extubate  VAP, PAD -- currently off continuous sedation   Diuresis as below   Cardiogenic Shock CAD sp CABG x5 P: Per Adv HF and CVTS Low dose hep while on impella  Continues on lasix gtt  Amio, Dig   Anemia, post op  Thrombocytopenia - stable  -Patient has received multiple blood products post CABG hemoglobin stable -Platelet count stable at 114  P: Trend CBC Hgb goal > 8 Close monitoring with initiation of heparin 4/29  Fever -post op v infectious. No leukocytosis. Started on empiric vanc cefepime 4/29. Had 3 d cefuroxime and 2 doses of vanc before this.  P:  will check a PCT which may help guide therapy  MRSA is negative -- may be able to dc vanc  Cx data sent 4/28, continue to follow   PAD s/p partial L foot amputation  P: Supportive care   AAA P: Outpt follow up BP management while inpt   DM2 -Hemoglobin A1c 11.6 P: Insulin gtt, per CVTS   Best practice   Diet:  Tube Feed  Pain/Anxiety/Delirium protocol (if indicated): Yes (RASS goal -1) VAP protocol (if indicated): Yes DVT prophylaxis: Contraindicated GI prophylaxis: PPI Glucose control:  Insulin gtt Central venous access:  Yes, and it is still needed Arterial line:  Yes, and it is still needed Foley:  Yes, and it is still needed Mobility:  bed rest  PT consulted: N/A Last date of multidisciplinary goals of care discussion Per primary Code Status:  full code Disposition: ICU  Labs   CBC: Recent Labs  Lab 07/03/2020 2100 07/26/20 0300 07/26/20 0314 07/26/20 1409 07/27/20 0304 07/27/20 0853 07/28/20 0322 07/28/20 0614  WBC 5.4 5.0  --  7.0 8.1  --  9.6  --   HGB 10.2* 10.1*   < > 9.5* 9.5* 9.2* 9.7* 9.9*  HCT 29.6* 29.7*   < > 28.1* 28.1* 27.0* 29.0* 29.0*  MCV 86.0 85.8  --  86.2 86.2  --  89.8  --   PLT 78* 110*  --  104* 114*  --  110*  --    < > = values in this interval not displayed.    Basic Metabolic Panel: Recent Labs  Lab 07/22/20 0246 07/23/20 0133 07/24/20 0322 07/11/2020 1933 07/26/20 0300 07/26/20 0314 07/26/20 1409  07/27/20 0304 07/27/20 0853 07/28/20 0322 07/28/20 0614 07/28/20 0823  NA 134* 130*   < > 141  137 138   < > 139 135 139 135 139  --   K 3.8 4.1   < > 3.7  3.5 4.6   < > 4.2 3.7 4.0 3.7 3.5  --   CL 98 97*   < > 108 105  --  106 102  --  101  --   --   CO2 27 25   < > 22 24  --  24 25  --  24  --   --   GLUCOSE 218* 351*   < > 133* 99  --  113* 123*  --  227*  --   --   BUN 24* 30*   < > 17 18  --  18 18  --  23  --   --  CREATININE 1.19 1.33*   < > 0.94 0.99  --  1.15 1.17  --  1.24  --   --   CALCIUM 9.0 8.9   < > 7.8* 8.7*  --  8.1* 8.2*  --  8.0*  --   --   MG 2.0 2.1  --   --  2.2  --  1.9  --   --   --   --  1.8   < > = values in this interval not displayed.   GFR: Estimated Creatinine Clearance: 78.5 mL/min (by C-G formula based on SCr of 1.24 mg/dL). Recent Labs  Lab 07/26/20 0300 07/26/20 1409 07/27/20 0304 07/28/20 0322  WBC 5.0 7.0 8.1 9.6    Liver Function Tests: Recent Labs  Lab 07/01/2020 1933 07/26/20 1409 07/27/20 0304  AST 34 26 21  ALT '17 13 14  '$ ALKPHOS 33* 28* 38  BILITOT 2.4* 1.2 1.1  PROT 4.7* 4.9* 5.0*  ALBUMIN 3.6 3.4* 3.1*   No results for input(s): LIPASE, AMYLASE in the last 168 hours. No results for input(s): AMMONIA in the last 168 hours.  ABG    Component Value Date/Time   PHART 7.332 (L) 07/28/2020 0614   PCO2ART 42.8 07/28/2020 0614   PO2ART 68 (L) 07/28/2020 0614   HCO3 22.3 07/28/2020 0614   TCO2 23 07/28/2020 0614   ACIDBASEDEF 3.0 (H) 07/28/2020 0614   O2SAT 57.0 07/28/2020 0745     Coagulation Profile: Recent Labs  Lab 07/18/2020 1617 07/22/2020 1933  INR 1.4* 1.5*    Cardiac Enzymes: No results for input(s): CKTOTAL, CKMB, CKMBINDEX, TROPONINI in the last 168 hours.  HbA1C: Hgb A1c MFr Bld  Date/Time Value Ref Range Status  07/23/2020 06:28 PM 11.6 (H) 4.8 - 5.6 % Final    Comment:    (NOTE) Pre diabetes:          5.7%-6.4%  Diabetes:              >6.4%  Glycemic control for   <7.0% adults with diabetes    08/05/2019 03:09 AM 11.6 (H) 4.8 - 5.6 % Final    Comment:    (NOTE) Pre diabetes:          5.7%-6.4% Diabetes:              >6.4% Glycemic control for   <7.0% adults with diabetes     CBG: Recent Labs  Lab 07/27/20 1542 07/27/20 1939 07/27/20 2353 07/28/20 0333 07/28/20 0747  GLUCAP 129* 157* 201* 216* 174*   CRITICAL CARE Performed by: Cristal Generous   Total critical care time: 41 minutes  Critical care time was exclusive of separately billable procedures and treating other patients. Critical care was necessary to treat or prevent imminent or life-threatening deterioration.  Critical care was time spent personally by me on the following activities: development of treatment plan with patient and/or surrogate as well as nursing, discussions with consultants, evaluation of patient's response to treatment, examination of patient, obtaining history from patient or surrogate, ordering and performing treatments and interventions, ordering and review of laboratory studies, ordering and review of radiographic studies, pulse oximetry and re-evaluation of patient's condition.  Eliseo Gum MSN, AGACNP-BC [place] for pager details 07/28/2020, 10:11 AM     Pulmonary critical care attending:  This is a 70 year old male, past medical history of hypertension, hyperlipidemia, type 2 diabetes, CVA, AAA, left foot amputation, femoropopliteal bypass, heart failure presented on 07/24/2020 with outpatient positive stress test  and left heart catheterization with multivessel coronary artery disease.  Decision was made to take to the operating room for quintuple bypass.  Patient come out of the operating room in cardiogenic shock with Impella support.  Overnight patient spiked a temperature of 101.  Patient was started on antibiotics today by cardiothoracic surgery.  Pulmonary was consulted for recommendations in mechanical vent wean and  extubation.  Throughout the day patient has had increase vasopressor requirements.  Patient remains on Impella.  Case was discussed with heart failure service Dr. Aundra Dubin.  BP 101/71   Pulse 92   Temp 98.96 F (37.2 C)   Resp 18   Ht '6\' 1"'$  (1.854 m)   Wt 123.6 kg   SpO2 95%   BMI 35.95 kg/m   General: Obese chronically ill-appearing male intubated on mechanical life support critically ill HEENT: Endotracheal tube in place Chest: Midline incision, Impella within the right axillary, chest drains mediastinal drain Heart: Regular rhythm S1-S2 Lungs: Bilateral mechanically ventilated breath sounds Abdomen: Obese mildly distended soft grimaces to palpation.  Labs: Reviewed hemoglobin 9.9  Assessment: Acute hypoxic respiratory failure requiring intubation mechanical ventilation, small right-sided pleural effusion Cardiogenic shock, coronary artery disease status post quintuple bypass Postoperative anemia Thrombocytopenia Rising LDH, Impella in place Fever, possible sepsis PAD AAA Diabetes type 2 with hyperglycemia  Plan: Adult mechanical vent protocol, PAD guidelines sedation Remains on norepinephrine and epinephrine for vasopressor support May need to consider adding vasopressin. Remains on Impella, P7. Lasix infusion per heart failure service Amiodarone plus digoxin Insulin sq  Bedside US, minimal right sided effusion, large liver with high riding diaphragm   This patient is critically ill with multiple organ system failure; which, requires frequent high complexity decision making, assessment, support, evaluation, and titration of therapies. This was completed through the application of advanced monitoring technologies and extensive interpretation of multiple databases. During this encounter critical care time was devoted to patient care services described in this note for 50 minutes.  Garner Nash, DO [facility] 07/28/2020 6:08 PM

## 2020-07-28 NOTE — Progress Notes (Addendum)
Patient ID: Danny Carpenter, male   DOB: Sep 21, 1950, 70 y.o.   MRN: [id number]     Advanced Heart Failure Rounding Note  PCP-Cardiologist: No primary care provider on file.   Subjective:   4/26 S/P CABG  Intubated FiO2 40%.   Negative -2.2  Lasix gtt 8 mg/hr.   He remains on epinephrine 1, NE 4, milrinone 0.375.  Impella at P6  He remains in NSR in 70s  [place] numbers: CVP 15-16 PA 39/24  CO 5.3  CI 2.3  Co-ox 45%   Impella P6 Flow 3.7  LDH 209   Objective:   Weight Range: 123.6 kg Body mass index is 35.95 kg/m.   Vital Signs:   Temp:  [97.5 F (36.4 C)-101.7 F (38.7 C)] 101.3 F (38.5 C) (04/29 0700) Pulse Rate:  [59-104] 74 (04/29 0700) Resp:  [15-28] 23 (04/29 0700) BP: (82-129)/(65-92) 94/75 (04/29 0600) SpO2:  [88 %-100 %] 94 % (04/29 0700) Arterial Line BP: (86-177)/(52-83) 104/56 (04/29 0700) FiO2 (%):  [40 %-60 %] 40 % (04/29 0412) Weight:  [123.6 kg] 123.6 kg (04/29 0452) Last BM Date: 07/23/20  Weight change: Filed Weights   07/23/2020 0522 07/26/20 0500 07/28/20 0452  Weight: 108.7 kg 127 kg 123.6 kg    Intake/Output:   Intake/Output Summary (Last 24 hours) at 07/28/2020 0712 Last data filed at 07/28/2020 0700 Gross per 24 hour  Intake 2274.19 ml  Output 4540 ml  Net -2265.81 ml      Physical Exam   CVP 15-16  General:  Vent /sedated HEENT: ETT Neck: supple. Difficult to assess. Carotids 2+ bilat; no bruits. No lymphadenopathy or thryomegaly appreciated.  Cor: PMI nondisplaced. Regular rate & rhythm. No rubs, gallops or murmurs. R axillary Impella . Sternal dressing.  Lungs: Coarse throughout Abdomen: soft, nontender, distended. No hepatosplenomegaly. No bruits or masses. Good bowel sounds. Extremities: no cyanosis, clubbing, rash, edema Neuro: Intubated/sedate   Telemetry   NSR 70s (personally reviewed)  Labs    CBC Recent Labs    07/27/20 0304 07/27/20 0853 07/28/20 0322 07/28/20 0614  WBC 8.1  --  9.6  --   HGB 9.5*   <  > 9.7* 9.9*  HCT 28.1*   < > 29.0* 29.0*  MCV 86.2  --  89.8  --   PLT 114*  --  110*  --    < > = values in this interval not displayed.   Basic Metabolic Panel Recent Labs    07/26/20 0300 07/26/20 0314 07/26/20 1409 07/27/20 0304 07/27/20 0853 07/28/20 0322 07/28/20 0614  NA 138   < > 139 135   < > 135 139  K 4.6   < > 4.2 3.7   < > 3.7 3.5  CL 105  --  106 102  --  101  --   CO2 24  --  24 25  --  24  --   GLUCOSE 99  --  113* 123*  --  227*  --   BUN 18  --  18 18  --  23  --   CREATININE 0.99  --  1.15 1.17  --  1.24  --   CALCIUM 8.7*  --  8.1* 8.2*  --  8.0*  --   MG 2.2  --  1.9  --   --   --   --    < > = values in this interval not displayed.   Liver Function Tests Recent Labs    07/26/20 1409  07/27/20 0304  AST 26 21  ALT 13 14  ALKPHOS 28* 38  BILITOT 1.2 1.1  PROT 4.9* 5.0*  ALBUMIN 3.4* 3.1*   No results for input(s): LIPASE, AMYLASE in the last 72 hours. Cardiac Enzymes No results for input(s): CKTOTAL, CKMB, CKMBINDEX, TROPONINI in the last 72 hours.  BNP: BNP (last 3 results) Recent Labs    07/27/2020 1211  BNP 465.5*    ProBNP (last 3 results) No results for input(s): PROBNP in the last 8760 hours.   D-Dimer No results for input(s): DDIMER in the last 72 hours. Hemoglobin A1C No results for input(s): HGBA1C in the last 72 hours. Fasting Lipid Panel Recent Labs    07/28/20 0322  TRIG 168*   Thyroid Function Tests No results for input(s): TSH, T4TOTAL, T3FREE, THYROIDAB in the last 72 hours.  Invalid input(s): FREET3  Other results:   Imaging    No results found.   Medications:     Scheduled Medications: . sodium chloride   Intravenous Once  . acetaminophen  1,000 mg Oral Q6H   Or  . acetaminophen (TYLENOL) oral liquid 160 mg/5 mL  1,000 mg Per Tube Q6H  . aspirin EC  325 mg Oral Daily   Or  . aspirin  324 mg Per Tube Daily  . bisacodyl  10 mg Oral Daily   Or  . bisacodyl  10 mg Rectal Daily  . chlorhexidine  gluconate (MEDLINE KIT)  15 mL Mouth Rinse BID  . Chlorhexidine Gluconate Cloth  6 each Topical Daily  . colchicine  0.3 mg Per Tube BID  . digoxin  0.125 mg Per NG tube Daily  . docusate  200 mg Per Tube Daily  . gabapentin  600 mg Oral TID  . insulin aspart  0-15 Units Subcutaneous Q4H  . mouth rinse  15 mL Mouth Rinse 10 times per day  . pantoprazole sodium  40 mg Per Tube Daily  . rosuvastatin  40 mg Per Tube Daily  . sodium chloride flush  3 mL Intravenous Q12H    Infusions: . sodium chloride Stopped (07/28/20 0604)  . amiodarone 30 mg/hr (07/28/20 0700)  . dexmedetomidine (PRECEDEX) IV infusion Stopped (07/27/20 1932)  . epinephrine 2 mcg/min (07/28/20 0700)  . furosemide (LASIX) 200 mg in dextrose 5% 100 mL (4m/mL) infusion 8 mg/hr (07/28/20 0700)  . impella catheter heparin 50 unit/mL in dextrose 5%    . lactated ringers    . lactated ringers    . lactated ringers 10 mL/hr at 07/28/20 0700  . milrinone 0.375 mcg/kg/min (07/28/20 0700)  . niCARDipine Stopped (07/26/20 0404)  . norepinephrine (LEVOPHED) Adult infusion Stopped (07/26/20 06433  . potassium chloride 50 mL/hr at 07/28/20 0700  . propofol (DIPRIVAN) infusion Stopped (07/28/20 0631)    PRN Medications: dextrose, lactated ringers, metoprolol tartrate, morphine injection, ondansetron (ZOFRAN) IV, oxyCODONE, sodium chloride flush, traMADol  Assessment/Plan   1. Cardiogenic shock: Ischemic cardiomyopathy, post-CABG on 4/26.  He has Impella 5.5 in place, at P7 with adequate flow and no alarms.  Position stable by echo.  Limited echo 4/27 with 20%, the RV appears normal in size with moderate systolic dysfunction. CI 2.3 by thermodilution with co-ox 45%. Repeat now. CVP 15-16.  - Continue lasix drip at 8 mg per hour.  - He is on epinephrine 1, milrinone 0.375, NE 4.  Renal function ok.  - Continue Impella at P6. Has heparin in purge but not systemic heparin given recent post-op bleeding => systemic heparin when ok  with surgery . Limited ECHO today .   - On digoxin.  - On amio drip to avoid A fib => transition to po.  2. CAD: s/p CABG x 5 with LIMA-LAD, seq SVG-D1/ramus, seq SVG-PDA/PLV.   - ASA - Crestor 3. Anemia: Post-op bleeding, back to OR with multiple products given on 4/26 post-CABG.  Hgb stable at 9.6 today.  4. Thrombocytopenia: Plts 110K today, suspect low post-op/post-surgical bleeding and multiple blood products.  - Follow.  5. PAD: Extensive history.  6. AAA: Monitoring as outpatient.  7. Type 2 DM: Insulin.  Hgb A1c was 11.6, poor control.  8. Rhythm:Maintaining NSR.   9. Neuro: Wakes up and follows commands.  9. ID: Tm 101.7 , WBCs normal.  - Cultures NGTD.  - Ongoing fevers, start vancomycin/cefepime  Limited ECHO today at 08:30  Length of Stay: [place], NP  07/28/2020, 7:12 AM  Advanced Heart Failure Team Pager [phone] (M-F; 7a - 5p)  Please contact [facility] for night-coverage after hours (5p -7a ) and weekends on [email or web address]  Patient seen with NP, agree with the above note.   CI 2.3 this morning but co-ox 45% in early am.  Still with fever, Tm 101.7.  Good diuresis with Lasix gtt 8 mg/hr, creatinine 1.24.  He remains on epinephrine 1, NE 4, milrinone 0.375, Impella P6 (no alarms).   General: Sedated on vent.  Neck: JVP 14+ cm with thick neck, no thyromegaly or thyroid nodule.  Lungs: Decreased at bases. CV: Nondisplaced PMI.  Heart regular S1/S2, no S3/S4, no murmur.  1+ ankle edema. Abdomen: Soft, nontender, no hepatosplenomegaly, no distention.  Skin: Intact without lesions or rashes.  Neurologic: Sedated currently, per nursing will wake up and follow commands. Extremities: No clubbing or cyanosis.  HEENT: Normal.   Possible mixed cardiogenic/septic shock at this point with persistent fevers.  CI 2.3 this morning by thermo though early am co-ox low.  - Repeat co-ox.  - Continue NE and epinephrine, wean epinephrine off later today if MAP stable.  -  Decrease milrinone to 0.25.  - Increased Impella to P7 to help facilitate weaning of vasoactive meds.  LDH stable.  Heparin in purge, discussed with Dr. Orvan Seen and will start low dose heparin systemically.  Check position under echo today.  - Continue Lasix gtt 8 mg/hr, needs further diuresis.  Good diuresis yesterday.   Repeat BMET in pm to follow creatinine.   With persistent fevers, will add empiric vancomycin/cefepime.   No arrhythmias, stop amiodarone gtt and start po.   Hopefully extubate soon if he wakes up and diureses further.   CRITICAL CARE Performed by: Loralie Champagne  Total critical care time: 40 minutes  Critical care time was exclusive of separately billable procedures and treating other patients.  Critical care was necessary to treat or prevent imminent or life-threatening deterioration.  Critical care was time spent personally by me on the following activities: development of treatment plan with patient and/or surrogate as well as nursing, discussions with consultants, evaluation of patient's response to treatment, examination of patient, obtaining history from patient or surrogate, ordering and performing treatments and interventions, ordering and review of laboratory studies, ordering and review of radiographic studies, pulse oximetry and re-evaluation of patient's condition.  Loralie Champagne 07/28/2020 7:56 AM  Impella assessed under echo, in too deep.  Impella pulled back to about 4.9 cm with less ectopy and improvement in MAP.   Loralie Champagne 07/28/2020

## 2020-07-28 NOTE — Progress Notes (Signed)
EVENING ROUNDS NOTE :     [address]       [place] [address]             [phone]                 3 Days Post-Op Procedure(s) (LRB): EXPLORATION POST OPERATIVE OPEN HEART (N/A)   Total Length of Stay:  LOS: 8 days  Events:   Minimal response to stimuli Stable hemodynamics     BP 112/84   Pulse 95   Temp 98.96 F (37.2 C)   Resp 17   Ht '6\' 1"'$  (1.854 m)   Wt 123.6 kg   SpO2 90%   BMI 35.95 kg/m   PAP: (38-73)/(21-43) 62/31 CVP:  [11 mmHg-28 mmHg] 19 mmHg CO:  [4.7 L/min-7.3 L/min] 6.2 L/min CI:  [2 L/min/m2-3.2 L/min/m2] 2.7 L/min/m2  Vent Mode: PSV;CPAP FiO2 (%):  [40 %-50 %] 50 % Set Rate:  [20 bmp] 20 bmp Vt Set:  [630 mL] 630 mL PEEP:  [5 cmH20] 5 cmH20 Pressure Support:  [8 cmH20] 8 cmH20 Plateau Pressure:  [16 cmH20-30 cmH20] 30 cmH20  . sodium chloride Stopped (07/28/20 0905)  . sodium chloride Stopped (07/28/20 0851)  . acetaminophen 1,000 mg (07/28/20 1806)  . ceFEPime (MAXIPIME) IV Stopped (07/28/20 1209)  . dexmedetomidine (PRECEDEX) IV infusion Stopped (07/27/20 1932)  . electrolyte-A 100 mL/hr at 07/28/20 1800  . epinephrine Stopped (07/28/20 0818)  . feeding supplement (VITAL 1.5 CAL) 1,000 mL (07/28/20 1552)  . heparin 200 Units/hr (07/28/20 1800)  . impella catheter heparin 50 unit/mL in dextrose 5%    . lactated ringers    . lactated ringers    . lactated ringers Stopped (07/28/20 1139)  . milrinone 0.25 mcg/kg/min (07/28/20 1800)  . norepinephrine (LEVOPHED) Adult infusion Stopped (07/28/20 1003)  . propofol (DIPRIVAN) infusion Stopped (07/28/20 0631)  . [START ON 07/29/2020] vancomycin    . vasopressin 0.03 Units/min (07/28/20 1800)    I/O last 3 completed shifts: In: [id number] [I.V.:2659.8; Other:404; NG/GT:60; IV Piggyback:472.2] Out: [id number]; Emesis/NG output:350; Chest Tube:310]   CBC Latest Ref Rng & Units 07/28/2020 07/28/2020 07/28/2020  WBC 4.0 - 10.5 K/uL 6.4 - 9.6  Hemoglobin 13.0 - 17.0 g/dL  9.8(L) 9.9(L) 9.7(L)  Hematocrit 39.0 - 52.0 % 30.5(L) 29.0(L) 29.0(L)  Platelets 150 - 400 K/uL 104(L) - 110(L)    BMP Latest Ref Rng & Units 07/28/2020 07/28/2020 07/28/2020  Glucose 70 - 99 mg/dL 117(H) - 227(H)  BUN 8 - 23 mg/dL 29(H) - 23  Creatinine 0.61 - 1.24 mg/dL 1.60(H) - 1.24  Sodium 135 - 145 mmol/L 136 139 135  Potassium 3.5 - 5.1 mmol/L 3.7 3.5 3.7  Chloride 98 - 111 mmol/L 102 - 101  CO2 22 - 32 mmol/L 25 - 24  Calcium 8.9 - 10.3 mg/dL 7.7(L) - 8.0(L)    ABG    Component Value Date/Time   PHART 7.332 (L) 07/28/2020 0614   PCO2ART 42.8 07/28/2020 0614   PO2ART 68 (L) 07/28/2020 0614   HCO3 22.3 07/28/2020 0614   TCO2 23 07/28/2020 0614   ACIDBASEDEF 3.0 (H) 07/28/2020 0614   O2SAT 57.0 07/28/2020 0745       Melodie Bouillon, MD 07/28/2020 6:56 PM

## 2020-07-28 NOTE — Progress Notes (Signed)
  Echocardiogram 2D Echocardiogram has been performed.  Danny Carpenter 07/28/2020, 9:09 AM

## 2020-07-28 NOTE — Procedures (Signed)
Cortrak  Person Inserting Tube:  Brack Shaddock, RD Tube Type:  Cortrak - 43 inches Tube Location:  Left nare Initial Placement:  Stomach Secured by: Bridle Technique Used to Measure Tube Placement:  Documented cm marking at nare/ corner of mouth Cortrak Secured At:  86 cm    No x-ray is required. RN may begin using tube.   If the tube becomes dislodged please keep the tube and contact the Cort[facility] team at [email or web address] ([email or web address]) for replacement.  If after hours and replacement cannot be delayed, place a NG tube and confirm placement with an abdominal x-ray.    Mariana Single RD, LDN Clinical Nutrition Pager listed in [place]

## 2020-07-28 NOTE — Progress Notes (Signed)
Initial Nutrition Assessment  DOCUMENTATION CODES:   Not applicable  INTERVENTION:   Tube Feeding via Cortrak:  Vital 1.5 at 55 ml/hr Pro-source TF 45 mL QID Begin TF at rate of 20 ml/hr, titrate by 10 mL q 8 hours until goal rate of 55 ml/hr Provides 2140 kcals, 133 g of protein and 1003 mL of free water   NUTRITION DIAGNOSIS:   Inadequate oral intake related to acute illness as evidenced by NPO status.  GOAL:   Patient will meet greater than or equal to 90% of their needs  MONITOR:   Vent status,TF tolerance,Labs,Weight trends  REASON FOR ASSESSMENT:   Ventilator    ASSESSMENT:   70 yo male admitted with abnormal stress test with chest pain, SOB, elevated troponin with acute on chronic CHF. PMH includes HTN, HLD, DM, CVA, AAA, left food amputation, CHF  4/26 CABG x 5, brief CPR, Intubated  Pt remains on vent support, Impella in place, weaning as tolerated but poor mentation  Cortrak placed today, plan to initiate TF  Unable to obtain diet and weight history at this time  Weight up; current wt 123.6 kg. Admit wt 107.6 kg.   Labs: reviewed Meds: lasix drip, ss novolog, colace, miralax    NUTRITION - FOCUSED PHYSICAL EXAM:  Flowsheet Row Most Recent Value  Orbital Region No depletion  Upper Arm Region No depletion  Thoracic and Lumbar Region No depletion  Buccal Region No depletion  Temple Region No depletion  Clavicle Bone Region No depletion  Clavicle and Acromion Bone Region No depletion  Scapular Bone Region No depletion  Dorsal Hand No depletion  Patellar Region No depletion  Anterior Thigh Region No depletion  Posterior Calf Region No depletion  Edema (RD Assessment) Mild       Diet Order:   Diet Order            Diet NPO time specified  Diet effective now                 EDUCATION NEEDS:   Not appropriate for education at this time  Skin:  Skin Assessment: Reviewed RN Assessment  Last BM:  4/29  Height:   Ht Readings from  Last 1 Encounters:  07/19/2020 '6\' 1"'$  (1.854 m)    Weight:   Wt Readings from Last 1 Encounters:  07/28/20 123.6 kg     BMI:  Body mass index is 35.95 kg/m.  Estimated Nutritional Needs:   Kcal:  2100-2300 kcals  Protein:  130-150 g  Fluid:  >/= 2 L    Kerman Passey MS, RDN, LDN, CNSC Registered Dietitian III Clinical Nutrition RD Pager and On-Call Pager Number Located in [place]

## 2020-07-28 NOTE — Progress Notes (Signed)
ANTICOAGULATION CONSULT NOTE  Pharmacy Consult for Heparin Indication: Impella 5.5  No Known Allergies  Patient Measurements: Height: '6\' 1"'$  (185.4 cm) Weight: 123.6 kg (272 lb 7.8 oz) IBW/kg (Calculated) : 79.9 Heparin Dosing Weight: 102.8 kg  Vital Signs: Temp: 97.7 F (36.5 C) (04/29 2100) Temp Source: Core (04/29 1630) BP: 104/71 (04/29 2100) Pulse Rate: 85 (04/29 2100)  Labs: Recent Labs    07/27/20 0304 07/27/20 0305 07/28/20 0322 07/28/20 0614 07/28/20 1400 07/28/20 1535 07/28/20 2043  HGB 9.5*   < > 9.7* 9.9*  --  9.8*  --   HCT 28.1*   < > 29.0* 29.0*  --  30.5*  --   PLT 114*  --  110*  --   --  104*  --   HEPARINUNFRC  --    < > <0.10*  --  <0.10*  --  <0.10*  CREATININE 1.17  --  1.24  --   --  1.60*  --    < > = values in this interval not displayed.    Estimated Creatinine Clearance: 60.9 mL/min (A) (by C-G formula based on SCr of 1.6 mg/dL (H)).   Medical History: Past Medical History:  Diagnosis Date  . AAA (abdominal aortic aneurysm) without rupture ([place]) 06/11/2019   infrarenal   Last Assessment & Plan:  Uptodate.  Stable at 4.1cm Repeat visit to vascular surg in 12/2015.  Marland Kitchen Anxiety   . Arthritis   . Chronic fatigue 01/09/2015   Last Assessment & Plan:  Chronic fatigue. Suspect multifactorial including OSA, hyperglycemia (patient not taking insulin as prescribed), pain medications. No other constitutional sx.  Will plan for repeat lab work.   - follow up in 1 mth after sleep study results.  . Chronic pain 04/05/2013   Last Assessment & Plan:   Known Degenerative disc disease and bilateral peripheral neuropathic leg pain   continue cymbalta '60mg'$   reports has trialed injections in past as well as  '5mg'$  hydrocodone with no success. Also tried tramadol in the past with no benefit.  Prescribed Norco 7.5/'325mg'$  Hydrocodone/ Acetaminophen 1-1.5 tabs every 8 hours prn #150. 1 months through 04/17/14  UTox 06/14/14 app  . COPD (chronic obstructive pulmonary  disease) ([place])   . CVA (cerebral vascular accident) ([place])   . DDD (degenerative disc disease) 10/08/2004  . Depression   . Diabetic foot infection ([place]) 01/17/2019  . Fracture of rib    Left  . GERD (gastroesophageal reflux disease)   . HTN (hypertension)   . Hypercholesteremia   . Hyperlipidemia 06/11/2019   On Lipitor '80mg'$  per day  Last Assessment & Plan:  PLAN: HLD continue Lipitor '80mg'$  daily  . Osteomyelitis ([place]) 08/05/2019   left foot  . PAD (peripheral artery disease) ([place]) 01/27/2019  . Peripheral vascular disease ([place])   . PONV (postoperative nausea and vomiting)   . PVD (peripheral vascular disease) ([place])   . Seizure [facility])    one time in Oct. 2020  . Senile nuclear sclerosis 12/06/2011  . Sleep apnea    no cpap  . Subacute osteomyelitis of left foot ([place])   . Type 2 diabetes mellitus with other specified complication [facility])     Assessment: 70 yo M presents with NSTEMI and multivessel CAD, now s/p CABG with Impella support. Post-CABG had cardiac arrest with no flow on Impella. CPR initiated and chest opened at bedside and returned to OR for exploration given significant bleeding. Heparin purge solution held overnight. Received multiple blood products. Pharmacy asked to  start heparin purge the next morning.  Impella at P7 this afternoon. Purge flow of 12 ml/hr (~600 units/hr heparin). Heparin level remains undetectable this afternoon. Hgb 9.9, pltc 110. Chest tube output up this afternoon with 158m charted. Urine very dark indicating hemolysis, doesn't really look like hematuria. Will check LDH with next lab draw.   Discussed with Dr. AOrvan Seenand Dr. MAundra Dubinon rounds this morning and they agreed on starting systemic heparin very slowly and target lower goal for now.  PM f/u > heparin level remains undetectable.  No chest tube output (functioning?).  No overt bleeding noted.  Goal of Therapy:  Heparin level 0.2-0.3 Monitor platelets by anticoagulation protocol: Yes   Plan:   Continue heparin purge solution (50 u/ml) Increase IV heparin to 300 units/hr Check heparin level with AM labs. Monitor daily Heparin level, CBC, s/sx bleeding  JNevada Crane PRoylene Reason BTourney Plaza Surgical CenterClinical Pharmacist  07/28/2020 9:29 PM   [facility] Ctrpharmacy phone numbers are listed on [email or web address]

## 2020-07-28 NOTE — Progress Notes (Signed)
[facility] for Heparin Indication: Impella 5.5  No Known Allergies  Patient Measurements: Height: '6\' 1"'$  (185.4 cm) Weight: 123.6 kg (272 lb 7.8 oz) IBW/kg (Calculated) : 79.9 Heparin Dosing Weight: 102.8 kg  Vital Signs: Temp: 100.04 F (37.8 C) (04/29 1400) Temp Source: Core (04/29 1200) BP: 82/63 (04/29 1400) Pulse Rate: 91 (04/29 1400)  Labs: Recent Labs    07/07/2020 1617 07/24/2020 1744 07/17/2020 1933 07/24/2020 2100 07/26/20 1409 07/27/20 0304 07/27/20 0305 07/27/20 0853 07/28/20 0322 07/28/20 0614 07/28/20 1400  HGB 9.2*   < > 10.5*   < > 9.5* 9.5*  --  9.2* 9.7* 9.9*  --   HCT 28.9*   < > 31.0*   < > 28.1* 28.1*  --  27.0* 29.0* 29.0*  --   PLT 179  --   --    < > 104* 114*  --   --  110*  --   --   APTT 99*  --  41*  --   --   --   --   --   --   --   --   LABPROT 17.1*  --  17.9*  --   --   --   --   --   --   --   --   INR 1.4*  --  1.5*  --   --   --   --   --   --   --   --   HEPARINUNFRC  --   --   --    < >  --   --  <0.10*  --  <0.10*  --  <0.10*  CREATININE  --    < > 0.94   < > 1.15 1.17  --   --  1.24  --   --    < > = values in this interval not displayed.    Estimated Creatinine Clearance: 78.5 mL/min (by C-G formula based on SCr of 1.24 mg/dL).   Medical History: Past Medical History:  Diagnosis Date  . AAA (abdominal aortic aneurysm) without rupture ([place]) 06/11/2019   infrarenal   Last Assessment & Plan:  Uptodate.  Stable at 4.1cm Repeat visit to vascular surg in 12/2015.  Marland Kitchen Anxiety   . Arthritis   . Chronic fatigue 01/09/2015   Last Assessment & Plan:  Chronic fatigue. Suspect multifactorial including OSA, hyperglycemia (patient not taking insulin as prescribed), pain medications. No other constitutional sx.  Will plan for repeat lab work.   - follow up in 1 mth after sleep study results.  . Chronic pain 04/05/2013   Last Assessment & Plan:   Known Degenerative disc disease and bilateral peripheral  neuropathic leg pain   continue cymbalta '60mg'$   reports has trialed injections in past as well as  '5mg'$  hydrocodone with no success. Also tried tramadol in the past with no benefit.  Prescribed Norco 7.5/'325mg'$  Hydrocodone/ Acetaminophen 1-1.5 tabs every 8 hours prn #150. 1 months through 04/17/14  UTox 06/14/14 app  . COPD (chronic obstructive pulmonary disease) ([place])   . CVA (cerebral vascular accident) ([place])   . DDD (degenerative disc disease) 10/08/2004  . Depression   . Diabetic foot infection ([place]) 01/17/2019  . Fracture of rib    Left  . GERD (gastroesophageal reflux disease)   . HTN (hypertension)   . Hypercholesteremia   . Hyperlipidemia 06/11/2019   On Lipitor '80mg'$  per day  Last Assessment & Plan:  PLAN: HLD  continue Lipitor '80mg'$  daily  . Osteomyelitis ([place]) 08/05/2019   left foot  . PAD (peripheral artery disease) ([place]) 01/27/2019  . Peripheral vascular disease ([place])   . PONV (postoperative nausea and vomiting)   . PVD (peripheral vascular disease) ([place])   . Seizure [facility])    one time in Oct. 2020  . Senile nuclear sclerosis 12/06/2011  . Sleep apnea    no cpap  . Subacute osteomyelitis of left foot ([place])   . Type 2 diabetes mellitus with other specified complication [facility])     Assessment: 70 yo M presents with NSTEMI and multivessel CAD, now s/p CABG with Impella support. Post-CABG had cardiac arrest with no flow on Impella. CPR initiated and chest opened at bedside and returned to OR for exploration given significant bleeding. Heparin purge solution held overnight. Received multiple blood products. Pharmacy asked to start heparin purge the next morning.  Impella at P7 this afternoon. Purge flow of 12 ml/hr (~600 units/hr heparin). Heparin level remains undetectable this afternoon. Hgb 9.9, pltc 110. Chest tube output up this afternoon with 174m charted. Urine very dark indicating hemolysis, doesn't really look like hematuria. Will check LDH with next lab draw.   Discussed with  Dr. AOrvan Seenand Dr. MAundra Dubinon rounds this morning and they agreed on starting systemic heparin very slowly and target lower goal for now.  Goal of Therapy:  Heparin level 0.2-0.3 Monitor platelets by anticoagulation protocol: Yes   Plan:  Continue heparin purge solution (50 u/ml) Increase IV heparin to 200 units/hr Check HL Q6 hrs for first 24 hrs Monitor daily HL, CBC, s/sx bleeding  FErin HearingPharmD., BCPS Clinical Pharmacist 07/28/2020 2:28 PM  Please check [email or web address] for unit-specific pharmacy phone numbers.

## 2020-07-28 NOTE — Progress Notes (Signed)
3 Days Post-Op Procedure(s) (LRB): EXPLORATION POST OPERATIVE OPEN HEART (N/A) Subjective: Sedated  Objective: Vital signs in last 24 hours: Temp:  [97.5 F (36.4 C)-101.7 F (38.7 C)] 101.3 F (38.5 C) (04/29 0700) Pulse Rate:  [59-104] 74 (04/29 0700) Cardiac Rhythm: Atrial paced (04/28 2000) Resp:  [15-28] 23 (04/29 0700) BP: (82-129)/(65-92) 98/71 (04/29 0700) SpO2:  [88 %-100 %] 94 % (04/29 0700) Arterial Line BP: (86-177)/(52-83) 104/56 (04/29 0700) FiO2 (%):  [40 %-60 %] 40 % (04/29 0412) Weight:  [123.6 kg] 123.6 kg (04/29 0452)  Hemodynamic parameters for last 24 hours: PAP: (38-64)/(21-32) 45/23 CVP:  [11 mmHg-23 mmHg] 13 mmHg CO:  [4 L/min-7.3 L/min] 7.3 L/min CI:  [1.7 L/min/m2-3.2 L/min/m2] 3.2 L/min/m2  Intake/Output from previous day: 04/28 0701 - 04/29 0700 In: 2274.2 [I.V.:1709.1; IV Piggyback:299.1] Out: [id number] [Urine:4280; Emesis/NG output:150; Chest Tube:110] Intake/Output this shift: Total I/O In: 55 [I.V.:36.5; IV Piggyback:18.5] Out: -   General appearance: opens eyes to voice, follows commands Neurologic: unable to fully assess Heart: regular rate and rhythm, S1, S2 normal, no murmur, click, rub or gallop Lungs: diminished breath sounds bilaterally Abdomen: soft, non-tender; bowel sounds normal; no masses,  no organomegaly Extremities: edema 2+ Wound: c/d/i  Lab Results: Recent Labs    07/27/20 0304 07/27/20 0853 07/28/20 0322 07/28/20 0614  WBC 8.1  --  9.6  --   HGB 9.5*   < > 9.7* 9.9*  HCT 28.1*   < > 29.0* 29.0*  PLT 114*  --  110*  --    < > = values in this interval not displayed.   BMET:  Recent Labs    07/27/20 0304 07/27/20 0853 07/28/20 0322 07/28/20 0614  NA 135   < > 135 139  K 3.7   < > 3.7 3.5  CL 102  --  101  --   CO2 25  --  24  --   GLUCOSE 123*  --  227*  --   BUN 18  --  23  --   CREATININE 1.17  --  1.24  --   CALCIUM 8.2*  --  8.0*  --    < > = values in this interval not displayed.    PT/INR:   Recent Labs    07/23/2020 1933  LABPROT 17.9*  INR 1.5*   ABG    Component Value Date/Time   PHART 7.332 (L) 07/28/2020 0614   HCO3 22.3 07/28/2020 0614   TCO2 23 07/28/2020 0614   ACIDBASEDEF 3.0 (H) 07/28/2020 0614   O2SAT 90.0 07/28/2020 0614   CBG (last 3)  Recent Labs    07/27/20 1939 07/27/20 2353 07/28/20 0333  GLUCAP 157* 201* 216*    Assessment/Plan: S/P Procedure(s) (LRB): EXPLORATION POST OPERATIVE OPEN HEART (N/A) Mobilize Diuresis vanc/cefepime written for elevated temp  Feeding tube placement Ok to give systemic heparin slowly; he cannot afford an iatrogenic bleed   LOS: 8 days    Wonda Olds 07/28/2020

## 2020-07-29 ENCOUNTER — Inpatient Hospital Stay (HOSPITAL_COMMUNITY): Payer: No Typology Code available for payment source

## 2020-07-29 DIAGNOSIS — Z95811 Presence of heart assist device: Secondary | ICD-10-CM | POA: Diagnosis not present

## 2020-07-29 DIAGNOSIS — I214 Non-ST elevation (NSTEMI) myocardial infarction: Secondary | ICD-10-CM | POA: Diagnosis not present

## 2020-07-29 DIAGNOSIS — R570 Cardiogenic shock: Secondary | ICD-10-CM | POA: Diagnosis not present

## 2020-07-29 DIAGNOSIS — I5021 Acute systolic (congestive) heart failure: Secondary | ICD-10-CM | POA: Diagnosis not present

## 2020-07-29 DIAGNOSIS — I5023 Acute on chronic systolic (congestive) heart failure: Secondary | ICD-10-CM | POA: Diagnosis not present

## 2020-07-29 LAB — ECHOCARDIOGRAM LIMITED
Height: 73 in
Weight: 4359.82 oz

## 2020-07-29 LAB — PROCALCITONIN: Procalcitonin: 7.5 ng/mL

## 2020-07-29 LAB — BASIC METABOLIC PANEL
Anion gap: 12 (ref 5–15)
Anion gap: 11 (ref 5–15)
BUN: 35 mg/dL — ABNORMAL HIGH (ref 8–23)
BUN: 41 mg/dL — ABNORMAL HIGH (ref 8–23)
CO2: 22 mmol/L (ref 22–32)
CO2: 22 mmol/L (ref 22–32)
Calcium: 7.3 mg/dL — ABNORMAL LOW (ref 8.9–10.3)
Calcium: 7.2 mg/dL — ABNORMAL LOW (ref 8.9–10.3)
Chloride: 103 mmol/L (ref 98–111)
Chloride: 104 mmol/L (ref 98–111)
Creatinine, Ser: 1.82 mg/dL — ABNORMAL HIGH (ref 0.61–1.24)
Creatinine, Ser: 1.95 mg/dL — ABNORMAL HIGH (ref 0.61–1.24)
GFR, Estimated: 40 mL/min — ABNORMAL LOW (ref >60)
GFR, Estimated: 37 mL/min — ABNORMAL LOW (ref >60)
Glucose, Bld: 199 mg/dL — ABNORMAL HIGH (ref 70–99)
Glucose, Bld: 328 mg/dL — ABNORMAL HIGH (ref 70–99)
Potassium: 4.2 mmol/L (ref 3.5–5.1)
Potassium: 4 mmol/L (ref 3.5–5.1)
Sodium: 137 mmol/L (ref 135–145)
Sodium: 137 mmol/L (ref 135–145)

## 2020-07-29 LAB — BPAM RBC
Blood Product Expiration Date: 202205232359
ISSUE DATE / TIME: 202204291421
Unit Type and Rh: 6200

## 2020-07-29 LAB — GLUCOSE, CAPILLARY
Glucose-Capillary: 188 mg/dL — ABNORMAL HIGH (ref 70–99)
Glucose-Capillary: 197 mg/dL — ABNORMAL HIGH (ref 70–99)
Glucose-Capillary: 274 mg/dL — ABNORMAL HIGH (ref 70–99)
Glucose-Capillary: 306 mg/dL — ABNORMAL HIGH (ref 70–99)
Glucose-Capillary: 340 mg/dL — ABNORMAL HIGH (ref 70–99)

## 2020-07-29 LAB — CBC
HCT: 28.9 % — ABNORMAL LOW (ref 39.0–52.0)
Hemoglobin: 9.3 g/dL — ABNORMAL LOW (ref 13.0–17.0)
MCH: 29 pg (ref 26.0–34.0)
MCHC: 32.2 g/dL (ref 30.0–36.0)
MCV: 90 fL (ref 80.0–100.0)
Platelets: 90 10*3/uL — ABNORMAL LOW (ref 150–400)
RBC: 3.21 MIL/uL — ABNORMAL LOW (ref 4.22–5.81)
RDW: 17.8 % — ABNORMAL HIGH (ref 11.5–15.5)
WBC: 5.4 10*3/uL (ref 4.0–10.5)
nRBC: 0.4 % — ABNORMAL HIGH (ref 0.0–0.2)

## 2020-07-29 LAB — TYPE AND SCREEN
ABO/RH(D): A POS
Antibody Screen: NEGATIVE
Unit division: 0

## 2020-07-29 LAB — MAGNESIUM
Magnesium: 2.6 mg/dL — ABNORMAL HIGH (ref 1.7–2.4)
Magnesium: 2.4 mg/dL (ref 1.7–2.4)

## 2020-07-29 LAB — POCT I-STAT 7, (LYTES, BLD GAS, ICA,H+H)
Acid-base deficit: 3 mmol/L — ABNORMAL HIGH (ref 0.0–2.0)
Acid-base deficit: 4 mmol/L — ABNORMAL HIGH (ref 0.0–2.0)
Bicarbonate: 24.2 mmol/L (ref 20.0–28.0)
Bicarbonate: 23.1 mmol/L (ref 20.0–28.0)
Calcium, Ion: 1.12 mmol/L — ABNORMAL LOW (ref 1.15–1.40)
Calcium, Ion: 1.06 mmol/L — ABNORMAL LOW (ref 1.15–1.40)
HCT: 26 % — ABNORMAL LOW (ref 39.0–52.0)
HCT: 27 % — ABNORMAL LOW (ref 39.0–52.0)
Hemoglobin: 8.8 g/dL — ABNORMAL LOW (ref 13.0–17.0)
Hemoglobin: 9.2 g/dL — ABNORMAL LOW (ref 13.0–17.0)
O2 Saturation: 87 %
O2 Saturation: 97 %
Patient temperature: 36.7
Patient temperature: 36.9
Potassium: 4.3 mmol/L (ref 3.5–5.1)
Potassium: 4.1 mmol/L (ref 3.5–5.1)
Sodium: 137 mmol/L (ref 135–145)
Sodium: 138 mmol/L (ref 135–145)
TCO2: 26 mmol/L (ref 22–32)
TCO2: 25 mmol/L (ref 22–32)
pCO2 arterial: 53.5 mmHg — ABNORMAL HIGH (ref 32.0–48.0)
pCO2 arterial: 48.8 mmHg — ABNORMAL HIGH (ref 32.0–48.0)
pH, Arterial: 7.262 — ABNORMAL LOW (ref 7.350–7.450)
pH, Arterial: 7.283 — ABNORMAL LOW (ref 7.350–7.450)
pO2, Arterial: 60 mmHg — ABNORMAL LOW (ref 83.0–108.0)
pO2, Arterial: 102 mmHg (ref 83.0–108.0)

## 2020-07-29 LAB — COOXEMETRY PANEL
Carboxyhemoglobin: 1.3 % (ref 0.5–1.5)
Methemoglobin: 1.1 % (ref 0.0–1.5)
O2 Saturation: 64.3 %
Total hemoglobin: 8.8 g/dL — ABNORMAL LOW (ref 12.0–16.0)

## 2020-07-29 LAB — HEPARIN LEVEL (UNFRACTIONATED)
Heparin Unfractionated: <0.1 IU/mL — ABNORMAL LOW (ref 0.30–0.70)
Heparin Unfractionated: <0.1 IU/mL — ABNORMAL LOW (ref 0.30–0.70)
Heparin Unfractionated: <0.1 IU/mL — ABNORMAL LOW (ref 0.30–0.70)

## 2020-07-29 LAB — LACTATE DEHYDROGENASE: LDH: 215 U/L — ABNORMAL HIGH (ref 98–192)

## 2020-07-29 LAB — LACTIC ACID, PLASMA: Lactic Acid, Venous: 0.9 mmol/L (ref 0.5–1.9)

## 2020-07-29 LAB — PHOSPHORUS
Phosphorus: 4.5 mg/dL (ref 2.5–4.6)
Phosphorus: 3.8 mg/dL (ref 2.5–4.6)

## 2020-07-29 MED ORDER — FUROSEMIDE 10 MG/ML IJ SOLN
60.0000 mg | Freq: Once | INTRAMUSCULAR | Status: AC
  Administered 2020-07-29: 60 mg via INTRAVENOUS
  Filled 2020-07-29: qty 6

## 2020-07-29 MED ORDER — FUROSEMIDE 10 MG/ML IJ SOLN
15.0000 mg/h | INTRAVENOUS | Status: DC
  Administered 2020-07-29 – 2020-07-31 (×4): 10 mg/h via INTRAVENOUS
  Administered 2020-08-01: 15 mg/h via INTRAVENOUS
  Filled 2020-07-29 (×5): qty 20

## 2020-07-29 MED ORDER — SODIUM CHLORIDE 0.9 % IV SOLN
2.0000 g | Freq: Two times a day (BID) | INTRAVENOUS | Status: AC
  Administered 2020-07-29 – 2020-08-03 (×11): 2 g via INTRAVENOUS
  Filled 2020-07-29 (×11): qty 2

## 2020-07-29 NOTE — Progress Notes (Signed)
[address]ford[address]       [place] [address]             [phone]                 4 Days Post-Op Procedure(s) (LRB): EXPLORATION POST OPERATIVE OPEN HEART (N/A)   Events: Waking up and followed commands this am  _______________________________________________________________ Vitals: BP 101/83   Pulse 76   Temp 98.24 F (36.8 C)   Resp (!) 21   Ht '6\' 1"'$  (1.854 m)   Wt 123.6 kg   SpO2 92%   BMI 35.95 kg/m   - Neuro: sedated  - Cardiovascular: sinus  Drips: milr 0.25, levo 6, vaso 0.03  Impella P7 flow 3.8 PAP: (43-74)/(22-43) 69/39 CVP:  [16 mmHg-36 mmHg] 36 mmHg CO:  [4.7 L/min-6.9 L/min] 5.9 L/min CI:  [2 L/min/m2-3 L/min/m2] 2.5 L/min/m2  - Pulm:  Vent Mode: PRVC FiO2 (%):  [40 %-70 %] 70 % Set Rate:  [20 bmp] 20 bmp Vt Set:  [630 mL-650 mL] 630 mL PEEP:  [5 cmH20] 5 cmH20 Pressure Support:  [8 cmH20] 8 cmH20 Plateau Pressure:  [7 cmH20-38 cmH20] 38 cmH20  ABG    Component Value Date/Time   PHART 7.283 (L) 07/29/2020 0404   PCO2ART 48.8 (H) 07/29/2020 0404   PO2ART 102 07/29/2020 0404   HCO3 23.1 07/29/2020 0404   TCO2 25 07/29/2020 0404   ACIDBASEDEF 4.0 (H) 07/29/2020 0404   O2SAT 64.3 07/29/2020 0703    - Abd: soft - Extremity: edematous  .Intake/Output      04/29 0701 04/30 0700 04/30 0701 05/01 0700   I.V. (mL/kg) 2043.8 (16.5) 112.1 (0.9)   Blood 238    Other 293.8    NG/GT 422.3 70   IV Piggyback 1629.7    Total Intake(mL/kg) 4627.7 (37.4) 182.1 (1.5)   Urine (mL/kg/hr) 960 (0.3) 175 (0.4)   Emesis/NG output     Stool 0    Chest Tube 240 20   Total Output 1200 195   Net +3427.7 -12.9        Stool Occurrence 3 x       _______________________________________________________________ Labs: CBC Latest Ref Rng & Units 07/29/2020 07/29/2020 07/28/2020  WBC 4.0 - 10.5 K/uL - 5.4 -  Hemoglobin 13.0 - 17.0 g/dL 9.2(L) 9.3(L) 8.8(L)  Hematocrit 39.0 - 52.0 % 27.0(L) 28.9(L) 26.0(L)  Platelets 150 - 400 K/uL - 90(L)  -   CMP Latest Ref Rng & Units 07/29/2020 07/29/2020 07/28/2020  Glucose 70 - 99 mg/dL - 199(H) -  BUN 8 - 23 mg/dL - 35(H) -  Creatinine 0.61 - 1.24 mg/dL - 1.82(H) -  Sodium 135 - 145 mmol/L 138 137 137  Potassium 3.5 - 5.1 mmol/L 4.1 4.2 4.3  Chloride 98 - 111 mmol/L - 103 -  CO2 22 - 32 mmol/L - 22 -  Calcium 8.9 - 10.3 mg/dL - 7.3(L) -  Total Protein 6.5 - 8.1 g/dL - - -  Total Bilirubin 0.3 - 1.2 mg/dL - - -  Alkaline Phos 38 - 126 U/L - - -  AST 15 - 41 U/L - - -  ALT 0 - 44 U/L - - -    CXR: pending  _______________________________________________________________  Assessment and Plan: POD 4 s/p CABG and mediastinal washout  Neuro: sedated CV: maintain mechanical support.  Will maintain inotropes for now.  Pt is volume overload so now on lasix gtt.  Will consider CRRT Pulm: continue vent  support Renal: on lasix gtt GI: trickle tube feeds Heme: on heparin gtt ID: [email or web address] Dispo: continue ICU care   Lajuana Matte 07/29/2020 10:19 AM

## 2020-07-29 NOTE — Progress Notes (Signed)
ANTICOAGULATION CONSULT NOTE  Pharmacy Consult for Heparin Indication: Impella 5.5  No Known Allergies  Patient Measurements: Height: '6\' 1"'$  (185.4 cm) Weight: 123.6 kg (272 lb 7.8 oz) IBW/kg (Calculated) : 79.9 Heparin Dosing Weight: 102.8 kg  Vital Signs: Temp: 98.6 F (37 C) (04/30 0515) BP: 113/77 (04/30 0515) Pulse Rate: 68 (04/30 0515)  Labs: Recent Labs    07/28/20 0322 07/28/20 [id number] 07/28/20 1400 07/28/20 1535 07/28/20 2043 07/28/20 2323 07/29/20 0358 07/29/20 0404  HGB 9.7*   < >  --  9.8*  --  8.8* 9.3* 9.2*  HCT 29.0*   < >  --  30.5*  --  26.0* 28.9* 27.0*  PLT 110*  --   --  104*  --   --  90*  --   HEPARINUNFRC <0.10*  --  <0.10*  --  <0.10*  --  <0.10*  --   CREATININE 1.24  --   --  1.60*  --   --  1.82*  --    < > = values in this interval not displayed.    Estimated Creatinine Clearance: 53.5 mL/min (A) (by C-G formula based on SCr of 1.82 mg/dL (H)).   Assessment: 70 yo M presents with NSTEMI and multivessel CAD, now s/p CABG with Impella support. Post-CABG had cardiac arrest with no flow on Impella. CPR initiated and chest opened at bedside and returned to OR for exploration given significant bleeding. Heparin purge solution held overnight. Received multiple blood products. Pharmacy asked to start heparin purge the next morning.  Impella purge flow of 11.9 ml/hr (595 units/hr heparin). Heparin level remains undetectable. Hgb 9.2, plt 90. LDH 215.  Discussed with Dr. Orvan Seen and Dr. Aundra Dubin 4/29 am and decision to start systemic heparin very slowly and target lower goal.  Goal of Therapy:  Heparin level 0.2-0.3 units/ml Monitor platelets by anticoagulation protocol: Yes   Plan:  Continue heparin purge solution (50 u/ml) Increase IV heparin to 400 units/hr Check 6 hr heparin level  Sherlon Handing, PharmD, BCPS Please see [email or web address] for complete clinical pharmacist phone list  07/29/2020 5:21 AM

## 2020-07-29 NOTE — Progress Notes (Signed)
NAMEMonish Carpenter, MRN:  [id number], DOB:  June 03, 1950, LOS: 9 ADMISSION DATE:  07/18/2020, CONSULTATION DATE: 07/28/2018 REFERRING MD: Dr. Orvan Seen, CHIEF COMPLAINT: Ventilator dependent  History of Present Illness:  Danny Carpenter is a 70 year old male with past medical history significant for hypertension, hyperlipidemia, type 2 diabetes, CVA, AAA, left foot amputation s/p fem pop bypass and heart failure who presented to the emergency department 4/21 after undergoing outpatient stress test that revealed EF of 20% and acute ischemia.  Since admission patient has been evaluated by cardiology and cardiothoracic surgery and patient underwent TEE and left heart cath.  LHC/RHC revealed severe three-vessel coronary artery disease with moderately elevated left heart and pulmonary artery pressures.  At that time patient was placed on goal-directed medical therapy for acute systolic congestive heart failure and CTS surgery was consulted for CABG potential.  Patient underwent CABG x5 with Dr. Orvan Seen 4/26, post CABG CODE BLUE called due to low flow on Impella, CTS notified.  Patient underwent bedside exploratory postoperative open heart where Dr. Julien Girt reopened the chest incision and hematoma was removed, patient's hemodynamics improved upon opening sternotomy.  PCCM consulted morning of 4/28 due to difficulty weaning ventilator  Pertinent  Medical History  Hypertension, hyperlipidemia, type 2 diabetes, CVA, AAA, left foot amputation s/p fem pop bypass and heart failure   Significant Hospital Events: Including procedures, antibiotic start and stop dates in addition to other pertinent events   . 4/21 presented to the ED for evaluation of abnormal stress test . 4/22 left and right heart cath severe multivessel disease . 4/26 CABG x5 with hemodynamic instability post requiring brief CPR with eventual return back to the OR anastomosis bleeding was seen . 4/28 remains ventilator dependent, slowly weaning  PEEP  Interim History / Subjective:   ECHO again this AM for impella placement localization CVP 22 this AM   Objective   Blood pressure 98/75, pulse 66, temperature 98.24 F (36.8 C), resp. rate 20, height '6\' 1"'$  (1.854 m), weight 123.6 kg, SpO2 98 %. PAP: (42-74)/(22-43) 49/23 CVP:  [15 mmHg-35 mmHg] 16 mmHg CO:  [4.7 L/min-6.9 L/min] 6.5 L/min CI:  [2 L/min/m2-3 L/min/m2] 2.8 L/min/m2  Vent Mode: PRVC FiO2 (%):  [40 %-60 %] 60 % Set Rate:  [20 bmp] 20 bmp Vt Set:  [630 mL-650 mL] 650 mL PEEP:  [5 cmH20] 5 cmH20 Pressure Support:  [8 cmH20] 8 cmH20 Plateau Pressure:  [7 cmH20-30 cmH20] 7 cmH20   Intake/Output Summary (Last 24 hours) at 07/29/2020 0737 Last data filed at 07/29/2020 0600 Gross per 24 hour  Intake 4476.6 ml  Output 1200 ml  Net 3276.6 ml   Filed Weights   07/16/2020 0522 07/26/20 0500 07/28/20 0452  Weight: 108.7 kg 127 kg 123.6 kg    Examination: General: obese male critically ill HEENT: ETT in place  Neuro: alert to voice  CV: rrr, impella in place, s1 s2  PULM: BL vented breaths  GI: soft nt nd  Extremities: dependent edema  Skin: no rash   Labs/imaging that I have personally reviewed    4/27 Limited echo to evaluate Impella, good positioning, EF less than 20%  4/29 CBC Plt 110 Hgb 9.7 BMP. Coox 57. CXR with stable support devices, R pleural effusion and some pulmonary edema   Resolved Hospital Problem list     Assessment & Plan:   Acute hypoxic respiratory failure  R pleural effusion, mild pulmonary edema  -Secondary to cardiogenic shock status post CABG P: Wean to extubation as tolerated.  Currently on significant amount of support. Volume overloaded.  CVP at 22. Remains on Impella and ongoing diuresis.  Not ready for extubation.  Cardiogenic Shock CAD sp CABG x5 P: Remains on Impella Amiodarone plus digoxin Continuous vasopressor support.  Anemia, post op  Thrombocytopenia - stable  -Patient has received multiple blood products  post CABG hemoglobin stable -Platelet count stable at 114  P: Follow CBC On heparin infusion Continue to observe for any sign of bleeding  Fever -post op v infectious. No leukocytosis. Started on empiric vanc cefepime 4/29. Had 3 d cefuroxime and 2 doses of vanc before this.  P: Continue antimicrobials  PAD s/p partial L foot amputation  P: Supportive care  AAA P: Outpatient follow-up Continue BP management  DM2 -Hemoglobin A1c 11.6 P: Follow CBGs, glucose goal 140-180  Best practice   Diet:  Tube Feed  Pain/Anxiety/Delirium protocol (if indicated): Yes (RASS goal -1) VAP protocol (if indicated): Yes DVT prophylaxis: Contraindicated GI prophylaxis: PPI Glucose control:  Insulin gtt Central venous access:  Yes, and it is still needed Arterial line:  Yes, and it is still needed Foley:  Yes, and it is still needed Mobility:  bed rest  PT consulted: N/A Last date of multidisciplinary goals of care discussion Per primary Code Status:  full code Disposition: ICU  Labs   CBC: Recent Labs  Lab 07/26/20 1409 07/27/20 0304 07/27/20 0853 07/28/20 0322 07/28/20 0614 07/28/20 1535 07/28/20 2323 07/29/20 0358 07/29/20 0404  WBC 7.0 8.1  --  9.6  --  6.4  --  5.4  --   HGB 9.5* 9.5*   < > 9.7* 9.9* 9.8* 8.8* 9.3* 9.2*  HCT 28.1* 28.1*   < > 29.0* 29.0* 30.5* 26.0* 28.9* 27.0*  MCV 86.2 86.2  --  89.8  --  89.2  --  90.0  --   PLT 104* 114*  --  110*  --  104*  --  90*  --    < > = values in this interval not displayed.    Basic Metabolic Panel: Recent Labs  Lab 07/26/20 0300 07/26/20 0314 07/26/20 1409 07/27/20 0304 07/27/20 [id number] 07/28/20 0322 07/28/20 [id number] 07/28/20 0823 07/28/20 1535 07/28/20 2323 07/29/20 0358 07/29/20 0404  NA 138   < > 139 135   < > 135 139  --  136 137 137 138  K 4.6   < > 4.2 3.7   < > 3.7 3.5  --  3.7 4.3 4.2 4.1  CL 105  --  106 102  --  101  --   --  102  --  103  --   CO2 24  --  24 25  --  24  --   --  25  --  22  --    GLUCOSE 99  --  113* 123*  --  227*  --   --  117*  --  199*  --   BUN 18  --  18 18  --  23  --   --  29*  --  35*  --   CREATININE 0.99  --  1.15 1.17  --  1.24  --   --  1.60*  --  1.82*  --   CALCIUM 8.7*  --  8.1* 8.2*  --  8.0*  --   --  7.7*  --  7.3*  --   MG 2.2  --  1.9  --   --   --   --  1.8 2.6*  --  2.6*  --   PHOS  --   --   --   --   --   --   --   --  4.2  --  4.5  --    < > = values in this interval not displayed.   GFR: Estimated Creatinine Clearance: 53.5 mL/min (A) (by C-G formula based on SCr of 1.82 mg/dL (H)). Recent Labs  Lab 07/27/20 0304 07/28/20 0322 07/28/20 1116 07/28/20 1535 07/29/20 0358  PROCALCITON  --   --  4.06  --  7.50  WBC 8.1 9.6  --  6.4 5.4  LATICACIDVEN  --   --   --  1.0 0.9    Liver Function Tests: Recent Labs  Lab 07/24/2020 1933 07/26/20 1409 07/27/20 0304  AST 34 26 21  ALT '17 13 14  '$ ALKPHOS 33* 28* 38  BILITOT 2.4* 1.2 1.1  PROT 4.7* 4.9* 5.0*  ALBUMIN 3.6 3.4* 3.1*   No results for input(s): LIPASE, AMYLASE in the last 168 hours. No results for input(s): AMMONIA in the last 168 hours.  ABG    Component Value Date/Time   PHART 7.283 (L) 07/29/2020 0404   PCO2ART 48.8 (H) 07/29/2020 0404   PO2ART 102 07/29/2020 0404   HCO3 23.1 07/29/2020 0404   TCO2 25 07/29/2020 0404   ACIDBASEDEF 4.0 (H) 07/29/2020 0404   O2SAT 64.3 07/29/2020 0703     Coagulation Profile: Recent Labs  Lab 07/15/2020 1617 07/11/2020 1933  INR 1.4* 1.5*    Cardiac Enzymes: No results for input(s): CKTOTAL, CKMB, CKMBINDEX, TROPONINI in the last 168 hours.  HbA1C: Hgb A1c MFr Bld  Date/Time Value Ref Range Status  07/01/2020 06:28 PM 11.6 (H) 4.8 - 5.6 % Final    Comment:    (NOTE) Pre diabetes:          5.7%-6.4%  Diabetes:              >6.4%  Glycemic control for   <7.0% adults with diabetes   08/05/2019 03:09 AM 11.6 (H) 4.8 - 5.6 % Final    Comment:    (NOTE) Pre diabetes:          5.7%-6.4% Diabetes:               >6.4% Glycemic control for   <7.0% adults with diabetes     CBG: Recent Labs  Lab 07/28/20 1123 07/28/20 1542 07/28/20 1936 07/28/20 2320 07/29/20 0401  GLUCAP 153* 125* 138* 170* 188*    This patient is critically ill with multiple organ system failure; which, requires frequent high complexity decision making, assessment, support, evaluation, and titration of therapies. This was completed through the application of advanced monitoring technologies and extensive interpretation of multiple databases. During this encounter critical care time was devoted to patient care services described in this note for 35 minutes.  Garner Nash, DO [facility] 07/29/2020 7:37 AM

## 2020-07-29 NOTE — Progress Notes (Signed)
  Echocardiogram 2D Echocardiogram has been performed.  Danny Carpenter 07/29/2020, 8:20 AM

## 2020-07-29 NOTE — Progress Notes (Signed)
ANTICOAGULATION CONSULT NOTE  Pharmacy Consult for Heparin Indication: Impella 5.5  No Known Allergies  Patient Measurements: Height: '6\' 1"'$  (185.4 cm) Weight: 123.6 kg (272 lb 7.8 oz) IBW/kg (Calculated) : 79.9 Heparin Dosing Weight: 102.8 kg  Vital Signs: Temp: 98.06 F (36.7 C) (04/30 1230) BP: 112/78 (04/30 1230) Pulse Rate: 58 (04/30 1230)  Labs: Recent Labs    07/28/20 0322 07/28/20 [id number] 07/28/20 1535 07/28/20 2043 07/28/20 2323 07/29/20 0358 07/29/20 0404 07/29/20 1130  HGB 9.7*   < > 9.8*  --  8.8* 9.3* 9.2*  --   HCT 29.0*   < > 30.5*  --  26.0* 28.9* 27.0*  --   PLT 110*  --  104*  --   --  90*  --   --   HEPARINUNFRC <0.10*   < >  --  <0.10*  --  <0.10*  --  <0.10*  CREATININE 1.24  --  1.60*  --   --  1.82*  --   --    < > = values in this interval not displayed.    Estimated Creatinine Clearance: 53.5 mL/min (A) (by C-G formula based on SCr of 1.82 mg/dL (H)).   Assessment: 70 yo M presents with NSTEMI and multivessel CAD, now s/p CABG with Impella support. Post-CABG had cardiac arrest with no flow on Impella. CPR initiated and chest opened at bedside and returned to OR for exploration given significant bleeding. Heparin purge solution held overnight. Received multiple blood products. Pharmacy asked to start heparin purge the next morning.  Impella purge flow of 12 ml/hr (600 units/hr heparin). Heparin level remains undetectable. Hgb 9.2, plt 90. LDH 215.  Discussed with Dr. Orvan Seen and Dr. Aundra Dubin 4/29 am and decision to start systemic heparin very slowly and target lower goal.  Goal of Therapy:  Heparin level 0.2-0.3 units/ml Monitor platelets by anticoagulation protocol: Yes   Plan:  Continue heparin purge solution (50 u/ml) Increase IV heparin to 500 units/hr Check 6 hr heparin level  Erin Hearing PharmD., BCPS Clinical Pharmacist 07/29/2020 12:58 PM
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
## 2020-07-29 NOTE — Progress Notes (Signed)
ANTICOAGULATION CONSULT NOTE  Pharmacy Consult for Heparin Indication: Impella 5.5  No Known Allergies  Patient Measurements: Height: '6\' 1"'$  (185.4 cm) Weight: 123.6 kg (272 lb 7.8 oz) IBW/kg (Calculated) : 79.9 Heparin Dosing Weight: 102.8 kg  Vital Signs: Temp: 99.86 F (37.7 C) (04/30 2045) Temp Source: Core (04/30 1600) BP: 101/82 (04/30 2045) Pulse Rate: 61 (04/30 2045)  Labs: Recent Labs    07/28/20 0322 07/28/20 [id number] 07/28/20 1535 07/28/20 2043 07/28/20 2323 07/29/20 0358 07/29/20 0404 07/29/20 1130 07/29/20 1522 07/29/20 1949  HGB 9.7*   < > 9.8*  --  8.8* 9.3* 9.2*  --   --   --   HCT 29.0*   < > 30.5*  --  26.0* 28.9* 27.0*  --   --   --   PLT 110*  --  104*  --   --  90*  --   --   --   --   HEPARINUNFRC <0.10*   < >  --    < >  --  <0.10*  --  <0.10*  --  <0.10*  CREATININE 1.24  --  1.60*  --   --  1.82*  --   --  1.95*  --    < > = values in this interval not displayed.    Estimated Creatinine Clearance: 49.9 mL/min (A) (by C-G formula based on SCr of 1.95 mg/dL (H)).   Assessment: 70 yo M presents with NSTEMI and multivessel CAD, now s/p CABG with Impella support. Post-CABG had cardiac arrest with no flow on Impella. CPR initiated and chest opened at bedside and returned to OR for exploration given significant bleeding. Heparin purge solution held overnight. Received multiple blood products. Pharmacy asked to start heparin purge the next morning.  Impella purge flow of 12 ml/hr (600 units/hr heparin). Heparin level remains undetectable.    Goal of Therapy:  Heparin level 0.2-0.3 units/ml Monitor platelets by anticoagulation protocol: Yes   Plan:  Continue heparin purge solution (50 u/ml) Increase IV heparin to 600 units/hr Heparin level in the morning  Hildred Laser, PharmD Clinical Pharmacist **Pharmacist phone directory can now be found on [email or web address] ([email or web address]).  Listed under [place].

## 2020-07-30 ENCOUNTER — Inpatient Hospital Stay (HOSPITAL_COMMUNITY): Payer: No Typology Code available for payment source

## 2020-07-30 DIAGNOSIS — R570 Cardiogenic shock: Secondary | ICD-10-CM | POA: Diagnosis not present

## 2020-07-30 LAB — GLUCOSE, CAPILLARY
Glucose-Capillary: 318 mg/dL — ABNORMAL HIGH (ref 70–99)
Glucose-Capillary: 298 mg/dL — ABNORMAL HIGH (ref 70–99)
Glucose-Capillary: 345 mg/dL — ABNORMAL HIGH (ref 70–99)
Glucose-Capillary: 317 mg/dL — ABNORMAL HIGH (ref 70–99)
Glucose-Capillary: 277 mg/dL — ABNORMAL HIGH (ref 70–99)
Glucose-Capillary: 334 mg/dL — ABNORMAL HIGH (ref 70–99)

## 2020-07-30 LAB — POCT I-STAT 7, (LYTES, BLD GAS, ICA,H+H)
Acid-base deficit: 2 mmol/L (ref 0.0–2.0)
Bicarbonate: 24 mmol/L (ref 20.0–28.0)
Calcium, Ion: 1.08 mmol/L — ABNORMAL LOW (ref 1.15–1.40)
HCT: 28 % — ABNORMAL LOW (ref 39.0–52.0)
Hemoglobin: 9.5 g/dL — ABNORMAL LOW (ref 13.0–17.0)
O2 Saturation: 96 %
Patient temperature: 37.5
Potassium: 3.4 mmol/L — ABNORMAL LOW (ref 3.5–5.1)
Sodium: 138 mmol/L (ref 135–145)
TCO2: 25 mmol/L (ref 22–32)
pCO2 arterial: 46.3 mmHg (ref 32.0–48.0)
pH, Arterial: 7.325 — ABNORMAL LOW (ref 7.350–7.450)
pO2, Arterial: 92 mmHg (ref 83.0–108.0)

## 2020-07-30 LAB — CBC
HCT: 29.5 % — ABNORMAL LOW (ref 39.0–52.0)
Hemoglobin: 9.3 g/dL — ABNORMAL LOW (ref 13.0–17.0)
MCH: 28.3 pg (ref 26.0–34.0)
MCHC: 31.5 g/dL (ref 30.0–36.0)
MCV: 89.7 fL (ref 80.0–100.0)
Platelets: 112 10*3/uL — ABNORMAL LOW (ref 150–400)
RBC: 3.29 MIL/uL — ABNORMAL LOW (ref 4.22–5.81)
RDW: 17.3 % — ABNORMAL HIGH (ref 11.5–15.5)
WBC: 7.5 10*3/uL (ref 4.0–10.5)
nRBC: 0.7 % — ABNORMAL HIGH (ref 0.0–0.2)

## 2020-07-30 LAB — BASIC METABOLIC PANEL WITH GFR
Anion gap: 9 (ref 5–15)
BUN: 51 mg/dL — ABNORMAL HIGH (ref 8–23)
CO2: 24 mmol/L (ref 22–32)
Calcium: 7.3 mg/dL — ABNORMAL LOW (ref 8.9–10.3)
Chloride: 103 mmol/L (ref 98–111)
Creatinine, Ser: 2.25 mg/dL — ABNORMAL HIGH (ref 0.61–1.24)
GFR, Estimated: 31 mL/min — ABNORMAL LOW
Glucose, Bld: 312 mg/dL — ABNORMAL HIGH (ref 70–99)
Potassium: 4 mmol/L (ref 3.5–5.1)
Sodium: 136 mmol/L (ref 135–145)

## 2020-07-30 LAB — RENAL FUNCTION PANEL
Albumin: 2.5 g/dL — ABNORMAL LOW (ref 3.5–5.0)
Anion gap: 10 (ref 5–15)
BUN: 46 mg/dL — ABNORMAL HIGH (ref 8–23)
CO2: 24 mmol/L (ref 22–32)
Calcium: 7.4 mg/dL — ABNORMAL LOW (ref 8.9–10.3)
Chloride: 102 mmol/L (ref 98–111)
Creatinine, Ser: 2.07 mg/dL — ABNORMAL HIGH (ref 0.61–1.24)
GFR, Estimated: 34 mL/min — ABNORMAL LOW
Glucose, Bld: 333 mg/dL — ABNORMAL HIGH (ref 70–99)
Phosphorus: 3 mg/dL (ref 2.5–4.6)
Potassium: 3.4 mmol/L — ABNORMAL LOW (ref 3.5–5.1)
Sodium: 136 mmol/L (ref 135–145)

## 2020-07-30 LAB — PHOSPHORUS: Phosphorus: 3 mg/dL (ref 2.5–4.6)

## 2020-07-30 LAB — COOXEMETRY PANEL
Carboxyhemoglobin: 1 % (ref 0.5–1.5)
Methemoglobin: 1 % (ref 0.0–1.5)
O2 Saturation: 53.5 %
Total hemoglobin: 11 g/dL — ABNORMAL LOW (ref 12.0–16.0)

## 2020-07-30 LAB — CULTURE, RESPIRATORY W GRAM STAIN: Culture: NORMAL

## 2020-07-30 LAB — HEPARIN LEVEL (UNFRACTIONATED)
Heparin Unfractionated: <0.1 IU/mL — ABNORMAL LOW (ref 0.30–0.70)
Heparin Unfractionated: <0.1 [IU]/mL — ABNORMAL LOW (ref 0.30–0.70)

## 2020-07-30 LAB — PROCALCITONIN: Procalcitonin: 5.06 ng/mL

## 2020-07-30 LAB — LACTATE DEHYDROGENASE: LDH: 247 U/L — ABNORMAL HIGH (ref 98–192)

## 2020-07-30 LAB — MAGNESIUM: Magnesium: 2.3 mg/dL (ref 1.7–2.4)

## 2020-07-30 MED ORDER — DEXMEDETOMIDINE HCL IN NACL 400 MCG/100ML IV SOLN
0.4000 ug/kg/h | INTRAVENOUS | Status: DC
  Administered 2020-07-30: 0.9 ug/kg/h via INTRAVENOUS
  Administered 2020-07-30: 0.799 ug/kg/h via INTRAVENOUS
  Administered 2020-07-30: 0.7 ug/kg/h via INTRAVENOUS
  Administered 2020-07-31: 1.2 ug/kg/h via INTRAVENOUS
  Administered 2020-07-31 (×3): 1 ug/kg/h via INTRAVENOUS
  Administered 2020-07-31: 0.6 ug/kg/h via INTRAVENOUS
  Administered 2020-07-31 – 2020-08-01 (×2): 1 ug/kg/h via INTRAVENOUS
  Administered 2020-08-01: 0.3 ug/kg/h via INTRAVENOUS
  Administered 2020-08-01: 1 ug/kg/h via INTRAVENOUS
  Filled 2020-07-30 (×3): qty 100
  Filled 2020-07-30 (×2): qty 200
  Filled 2020-07-30 (×2): qty 100
  Filled 2020-07-30: qty 200
  Filled 2020-07-30 (×2): qty 100

## 2020-07-30 MED ORDER — POTASSIUM CHLORIDE 20 MEQ PO PACK
40.0000 meq | PACK | Freq: Once | ORAL | Status: AC
  Administered 2020-07-30: 40 meq
  Filled 2020-07-30: qty 2

## 2020-07-30 MED ORDER — POTASSIUM CHLORIDE 10 MEQ/50ML IV SOLN
10.0000 meq | INTRAVENOUS | Status: AC
  Administered 2020-07-30 (×3): 10 meq via INTRAVENOUS
  Filled 2020-07-30 (×3): qty 50

## 2020-07-30 MED ORDER — POTASSIUM CHLORIDE 20 MEQ PO PACK
40.0000 meq | PACK | Freq: Four times a day (QID) | ORAL | Status: AC
  Administered 2020-07-30 (×2): 40 meq
  Filled 2020-07-30 (×2): qty 2

## 2020-07-30 NOTE — Progress Notes (Signed)
ANTICOAGULATION CONSULT NOTE  Pharmacy Consult for Heparin Indication: Impella 5.5  No Known Allergies  Patient Measurements: Height: '6\' 1"'$  (185.4 cm) Weight: 125.4 kg (276 lb 7.3 oz) IBW/kg (Calculated) : 79.9 Heparin Dosing Weight: 102.8 kg  Vital Signs: Temp: 99.68 F (37.6 C) (05/01 0700) BP: 114/93 (05/01 0645) Pulse Rate: 60 (05/01 0700)  Labs: Recent Labs    07/28/20 1535 07/28/20 2043 07/29/20 0358 07/29/20 0404 07/29/20 1130 07/29/20 1522 07/29/20 1949 07/30/20 0331 07/30/20 0339 07/30/20 0559  HGB 9.8*   < > 9.3* 9.2*  --   --   --  9.3* 9.5*  --   HCT 30.5*   < > 28.9* 27.0*  --   --   --  29.5* 28.0*  --   PLT 104*  --  90*  --   --   --   --  112*  --   --   HEPARINUNFRC  --    < > <0.10*  --  <0.10*  --  <0.10* <0.10*  --   --   CREATININE 1.60*  --  1.82*  --   --  1.95*  --   --   --  2.07*   < > = values in this interval not displayed.    Estimated Creatinine Clearance: 47.4 mL/min (A) (by C-G formula based on SCr of 2.07 mg/dL (H)).   Assessment: 70 yo M presents with NSTEMI and multivessel CAD, now s/p CABG with Impella support. Post-CABG had cardiac arrest with no flow on Impella. CPR initiated and chest opened at bedside and returned to OR for exploration given significant bleeding. Heparin purge solution held overnight. Received multiple blood products. Pharmacy asked to start heparin purge the next morning.  Impella at P7 purge flow of 12 ml/hr (600 units/hr heparin). Heparin level remains undetectable. Systemic heparin up to 600 units/hr for a total of 1200 units/hr. No bleeding issues noted. LDH with slight trend up this morning but stable overall, cbc stable with hgb in 9s, CT output trending down to 150 over last 24 hours.    Goal of Therapy:  Heparin level 0.2-0.3 units/ml Monitor platelets by anticoagulation protocol: Yes   Plan:  Continue heparin purge solution (50 units/ml) Increase IV heparin to 700 units/hr Heparin level later  today  Erin Hearing PharmD., BCPS Clinical Pharmacist 07/30/2020 7:30 AM

## 2020-07-30 NOTE — Progress Notes (Signed)
Patient ID: Danny Carpenter, male   DOB: 23-Jul-1950, 70 y.o.   MRN: [id number]     Advanced Heart Failure Rounding Note  PCP-Cardiologist: No primary care provider on file.   Subjective:   4/26 S/P CABG  Remains intubated/sedated. Vent down to 40%  Will awaken and follow commands.   On NE2, VP 0.0. and milrinone 0.25. Diuresing on lasix gtt at 10.  Almost 4L out so far.   [place] numbers: CVP 17 PA 42/28 CI 2.9 Co-ox 54%  Impella P7 Flow 3.8 No alarms Waveforms ok  LDH 209 => 247 => 215 => 247    Objective:   Weight Range: 125.4 kg Body mass index is 36.47 kg/m.   Vital Signs:   Temp:  [99.32 F (37.4 C)-100.22 F (37.9 C)] 100.22 F (37.9 C) (05/01 1610) Pulse Rate:  [58-92] 87 (05/01 1610) Resp:  [18-23] 23 (05/01 1610) BP: (83-131)/(30-110) 114/65 (05/01 1610) SpO2:  [96 %-100 %] 96 % (05/01 1610) Arterial Line BP: (96-151)/(58-81) 121/68 (05/01 1600) FiO2 (%):  [40 %-60 %] 40 % (05/01 1610) Weight:  [125.4 kg] 125.4 kg (05/01 0045) Last BM Date: 07/30/20  Weight change: Filed Weights   07/26/20 0500 07/28/20 0452 07/30/20 0045  Weight: 127 kg 123.6 kg 125.4 kg    Intake/Output:   Intake/Output Summary (Last 24 hours) at 07/30/2020 1652 Last data filed at 07/30/2020 1600 Gross per 24 hour  Intake 3045.99 ml  Output 5135 ml  Net -2089.01 ml      Physical Exam   General:  Intubated/sedated  HEENT: normal + ETT Neck: supple.  JVP to jaw. Carotids 2+ bilat; no bruits. No lymphadenopathy or thryomegaly appreciated. Cor: PMI nondisplaced. Regular +sternal dressing CTs ok  Lungs: coarse Abdomen: obese soft, nontender, nondistended. No hepatosplenomegaly. No bruits or masses. Good bowel sounds. Extremities: no cyanosis, clubbing, rash, 3+ edema Neuro: intubated/sedated    Telemetry   VPaced 90 Personally reviewed   Labs    CBC Recent Labs    07/29/20 0358 07/29/20 0404 07/30/20 0331 07/30/20 0339  WBC 5.4  --  7.5  --   HGB 9.3*   < > 9.3*  9.5*  HCT 28.9*   < > 29.5* 28.0*  MCV 90.0  --  89.7  --   PLT 90*  --  112*  --    < > = values in this interval not displayed.   Basic Metabolic Panel Recent Labs    07/29/20 1522 07/30/20 0331 07/30/20 0339 07/30/20 0559  NA 137  --  138 136  K 4.0  --  3.4* 3.4*  CL 104  --   --  102  CO2 22  --   --  24  GLUCOSE 328*  --   --  333*  BUN 41*  --   --  46*  CREATININE 1.95*  --   --  2.07*  CALCIUM 7.2*  --   --  7.4*  MG 2.4 2.3  --   --   PHOS 3.8 3.0  --  3.0   Liver Function Tests Recent Labs    07/30/20 0559  ALBUMIN 2.5*   No results for input(s): LIPASE, AMYLASE in the last 72 hours. Cardiac Enzymes No results for input(s): CKTOTAL, CKMB, CKMBINDEX, TROPONINI in the last 72 hours.  BNP: BNP (last 3 results) Recent Labs    07/12/2020 1211  BNP 465.5*    ProBNP (last 3 results) No results for input(s): PROBNP in the last 8760 hours.  D-Dimer No results for input(s): DDIMER in the last 72 hours. Hemoglobin A1C No results for input(s): HGBA1C in the last 72 hours. Fasting Lipid Panel Recent Labs    07/28/20 0322  TRIG 168*   Thyroid Function Tests No results for input(s): TSH, T4TOTAL, T3FREE, THYROIDAB in the last 72 hours.  Invalid input(s): FREET3  Other results:   Imaging    DG CHEST PORT 1 VIEW  Result Date: 07/30/2020 CLINICAL DATA:  Reason for exam.  History of CABG. EXAM: PORTABLE CHEST 1 VIEW COMPARISON:  July 28, 2020 FINDINGS: The ETT, PA catheter, Impella device, and chest tubes are stable. No pneumothorax. Mild opacity in the left perihilar region is mildly more prominent. Opacity in the right base is more prominent the interval. There may be small layering effusions. No change in the cardiomediastinal silhouette. IMPRESSION: 1. Increasing opacity in the right lung in the left perihilar region a may represent atelectasis or developing infiltrate. Recommend attention on follow-up. 2. Small suspected layering effusions. 3. Support  apparatus as above. Electronically Signed   By: Dorise Bullion III M.D   On: 07/30/2020 08:45     Medications:     Scheduled Medications: . sodium chloride   Intravenous Once  . sodium chloride   Intravenous Once  . acetaminophen  1,000 mg Oral Q6H   Or  . acetaminophen (TYLENOL) oral liquid 160 mg/5 mL  1,000 mg Per Tube Q6H  . amiodarone  200 mg Per Tube BID  . aspirin EC  325 mg Oral Daily   Or  . aspirin  324 mg Per Tube Daily  . bisacodyl  10 mg Oral Daily   Or  . bisacodyl  10 mg Rectal Daily  . chlorhexidine gluconate (MEDLINE KIT)  15 mL Mouth Rinse BID  . Chlorhexidine Gluconate Cloth  6 each Topical Daily  . docusate  200 mg Per Tube Daily  . feeding supplement (PROSource TF)  45 mL Per Tube QID  . insulin aspart  0-15 Units Subcutaneous Q4H  . mouth rinse  15 mL Mouth Rinse 10 times per day  . pantoprazole sodium  40 mg Per Tube Daily  . polyethylene glycol  17 g Per Tube Daily  . rosuvastatin  40 mg Per Tube Daily  . sodium chloride flush  3 mL Intravenous Q12H    Infusions: . sodium chloride Stopped (07/28/20 0905)  . sodium chloride Stopped (07/28/20 0851)  . ceFEPime (MAXIPIME) IV Stopped (07/30/20 1034)  . dexmedetomidine (PRECEDEX) IV infusion 0.7 mcg/kg/hr (07/30/20 1600)  . electrolyte-A Stopped (07/29/20 0754)  . epinephrine Stopped (07/28/20 0818)  . feeding supplement (VITAL 1.5 CAL) 55 mL/hr at 07/30/20 1600  . furosemide (LASIX) 200 mg in dextrose 5% 100 mL (83m/mL) infusion 10 mg/hr (07/30/20 1626)  . heparin 700 Units/hr (07/30/20 1622)  . impella catheter heparin 50 unit/mL in dextrose 5% 12.1 mL/hr at 07/29/20 1300  . lactated ringers    . lactated ringers    . lactated ringers Stopped (07/28/20 1139)  . milrinone 0.25 mcg/kg/min (07/30/20 1600)  . norepinephrine (LEVOPHED) Adult infusion 2 mcg/min (07/30/20 1619)  . propofol (DIPRIVAN) infusion Stopped (07/28/20 0631)  . vasopressin 0.03 Units/min (07/30/20 1600)    PRN  Medications: sodium chloride, dextrose, lactated ringers, metoprolol tartrate, morphine injection, ondansetron (ZOFRAN) IV, oxyCODONE, sodium chloride flush, traMADol  Assessment/Plan   1. Cardiogenic shock: Ischemic cardiomyopathy, post-CABG on 4/26.  He has Impella 5.5 in place, at P7 with adequate flow and no alarms.  Position stable  by echo yesterday.  Limited echo 4/29 with EF< 20%, the RV appears normal in size with severe systolic dysfunction. CI 2.8 by thermodilution with co-ox 64%. Lasix stopped yesterday, CVP up to 21-22 and unable to wean vent.  Hemodynamically improved with VP pacing and current support, Now starting to diurese though creatinine continues to rise - Continue lasix gtt at 10. Follow renal function closely. If worsens, may end up on CVVH but hopefully can avoid.  - Maintain MAP, continue vasopressin 0.03 and NE. Keep MAP >= 70 - Continue current milrinone.  - Continue Impella at P7. Has heparin in purge and on low dose systemic heparin given post-op bleeding.  Wean vasoactive meds before Impella.   - Off digoxin with creatinine rise.  2. CAD: s/p CABG x 5 with LIMA-LAD, seq SVG-D1/ramus, seq SVG-PDA/PLV.   - ASA - Crestor - no change 3. Anemia: Post-op bleeding, back to OR with multiple products given on 4/26 post-CABG.  Hgb stable at 9.3 4. Thrombocytopenia: Plts 90K -> 112k  today, suspect low post-op/post-surgical bleeding and multiple blood products as well as sepsis.  - Follow.  5. PAD: Extensive history.  6. AAA: Monitoring as outpatient.  7. Type 2 DM: Insulin.  Hgb A1c was 11.6, poor control.  8. Atrial fibrillation: Rate-controlled.  Continue per tube amiodarone for now.   9. Neuro: Wakes up and follows commands.  10. ID: Now afebrile with normal WBCs.  Cultures NGTD.  PCT 7.5 on 4/29.  Currently on cefepime.  - Continue cefepime.  11. Acute hypoxemic respiratory failure: Pulmonary edema, possible PNA.  On vent, unable to wean overnight with marked volume  overload.   - Per CCM.  12. AKI: Creatinine up 1.8 -> 2.07 likely with ATN from hypotension.  He is volume overloaded and MAP now stable. Continue lasix gtt. Keep MAP >= 70. If this is unsuccessful and creatinine continues to rise, may need CVVH.   CRITICAL CARE Performed by: Glori Bickers  Total critical care time: 35 minutes  Critical care time was exclusive of separately billable procedures and treating other patients.  Critical care was necessary to treat or prevent imminent or life-threatening deterioration.  Critical care was time spent personally by me on the following activities: development of treatment plan with patient and/or surrogate as well as nursing, discussions with consultants, evaluation of patient's response to treatment, examination of patient, obtaining history from patient or surrogate, ordering and performing treatments and interventions, ordering and review of laboratory studies, ordering and review of radiographic studies, pulse oximetry and re-evaluation of patient's condition.  Glori Bickers 07/30/2020 4:52 PM

## 2020-07-30 NOTE — Progress Notes (Signed)
ANTICOAGULATION CONSULT NOTE  Pharmacy Consult for Heparin Indication: Impella 5.5  No Known Allergies  Patient Measurements: Height: '6\' 1"'$  (185.4 cm) Weight: 125.4 kg (276 lb 7.3 oz) IBW/kg (Calculated) : 79.9 Heparin Dosing Weight: 102.8 kg  Vital Signs: Temp: 100.22 F (37.9 C) (05/01 1700) Temp Source: Core (05/01 1600) BP: 114/65 (05/01 1610) Pulse Rate: 80 (05/01 1700)  Labs: Recent Labs    07/28/20 1535 07/28/20 2043 07/29/20 0358 07/29/20 0404 07/29/20 1130 07/29/20 1522 07/29/20 1949 07/30/20 0331 07/30/20 0339 07/30/20 0559 07/30/20 1655  HGB 9.8*   < > 9.3* 9.2*  --   --   --  9.3* 9.5*  --   --   HCT 30.5*   < > 28.9* 27.0*  --   --   --  29.5* 28.0*  --   --   PLT 104*  --  90*  --   --   --   --  112*  --   --   --   HEPARINUNFRC  --    < > <0.10*  --    < >  --  <0.10* <0.10*  --   --  <0.10*  CREATININE 1.60*  --  1.82*  --   --  1.95*  --   --   --  2.07* 2.25*   < > = values in this interval not displayed.    Estimated Creatinine Clearance: 43.6 mL/min (A) (by C-G formula based on SCr of 2.25 mg/dL (H)).   Assessment: 70 yo M presents with NSTEMI and multivessel CAD, now s/p CABG with Impella support. Post-CABG had cardiac arrest with no flow on Impella. CPR initiated and chest opened at bedside and returned to OR for exploration given significant bleeding. Heparin purge solution held overnight. Received multiple blood products. Pharmacy asked to start heparin purge the next morning.  Impella at P7 purge flow of 12.1 ml/hr (605 units/hr heparin). Heparin level remains undetectable. Systemic heparin up to 700 units/hr for a total of 1205 units/hr.   Goal of Therapy:  Heparin level 0.2-0.3 units/ml Monitor platelets by anticoagulation protocol: Yes   Plan:  Continue heparin purge solution (50 units/ml) Increase IV heparin to 800 units/hr Daily heparin level and CBC  Hildred Laser, PharmD Clinical Pharmacist **Pharmacist phone directory can  now be found on [email or web address] ([email or web address]).  Listed under [place].

## 2020-07-30 NOTE — Progress Notes (Addendum)
[address]       [place] [address]             [phone]                 5 Days Post-Op Procedure(s) (LRB): EXPLORATION POST OPERATIVE OPEN HEART (N/A)   Events: No events diuresing  _______________________________________________________________ Vitals: BP 90/73   Pulse 78   Temp 99.5 F (37.5 C)   Resp (!) 21   Ht '6\' 1"'$  (1.854 m)   Wt 125.4 kg   SpO2 100%   BMI 36.47 kg/m   - Neuro: sedated  - Cardiovascular: paced at 80.  In the 50s underneath  Drips: milr 0.25, levo 2, vaso 0.03  Impella P7 flow 3.8 PAP: (39-55)/(15-35) 42/29 CVP:  [11 mmHg-26 mmHg] 18 mmHg CO:  [4.6 L/min-5.8 L/min] 4.8 L/min CI:  [2 L/min/m2-2.6 L/min/m2] 2.1 L/min/m2  - Pulm:  Vent Mode: PRVC FiO2 (%):  [50 %-70 %] 50 % Set Rate:  [20 bmp] 20 bmp Vt Set:  [620 mL-630 mL] 630 mL PEEP:  [5 cmH20] 5 cmH20 Plateau Pressure:  [22 cmH20-28 cmH20] 22 cmH20  ABG    Component Value Date/Time   PHART 7.325 (L) 07/30/2020 0339   PCO2ART 46.3 07/30/2020 0339   PO2ART 92 07/30/2020 0339   HCO3 24.0 07/30/2020 0339   TCO2 25 07/30/2020 0339   ACIDBASEDEF 2.0 07/30/2020 0339   O2SAT 53.5 07/30/2020 0353    - Abd: soft - Extremity: edematous  .Intake/Output      04/30 0701 05/01 0700 05/01 0701 05/02 0700   I.V. (mL/kg) 1667.8 (13.3) 162.4 (1.3)   Blood     Other 464.8 36.1   NG/GT 815 140   IV Piggyback 606.6 70   Total Intake(mL/kg) 3554.2 (28.3) 408.5 (3.3)   Urine (mL/kg/hr) 3565 (1.2) 550 (1.3)   Stool 50    Chest Tube 150 20   Total Output 3765 570   Net -210.8 -161.5        Stool Occurrence 1 x       _______________________________________________________________ Labs: CBC Latest Ref Rng & Units 07/30/2020 07/30/2020 07/29/2020  WBC 4.0 - 10.5 K/uL - 7.5 -  Hemoglobin 13.0 - 17.0 g/dL 9.5(L) 9.3(L) 9.2(L)  Hematocrit 39.0 - 52.0 % 28.0(L) 29.5(L) 27.0(L)  Platelets 150 - 400 K/uL - 112(L) -   CMP Latest Ref Rng & Units 07/30/2020 07/30/2020  07/29/2020  Glucose 70 - 99 mg/dL 333(H) - 328(H)  BUN 8 - 23 mg/dL 46(H) - 41(H)  Creatinine 0.61 - 1.24 mg/dL 2.07(H) - 1.95(H)  Sodium 135 - 145 mmol/L 136 138 137  Potassium 3.5 - 5.1 mmol/L 3.4(L) 3.4(L) 4.0  Chloride 98 - 111 mmol/L 102 - 104  CO2 22 - 32 mmol/L 24 - 22  Calcium 8.9 - 10.3 mg/dL 7.4(L) - 7.2(L)  Total Protein 6.5 - 8.1 g/dL - - -  Total Bilirubin 0.3 - 1.2 mg/dL - - -  Alkaline Phos 38 - 126 U/L - - -  AST 15 - 41 U/L - - -  ALT 0 - 44 U/L - - -    CXR: PV congestion  _______________________________________________________________  Assessment and Plan: POD 5 s/p CABG, impella placement and mediastinal washout  Neuro: sedated CV: maintain mechanical support.  Will maintain inotropes for now.  Pt is volume overload so now on lasix gtt.  Weaning levo Pulm: continue vent support Renal: on lasix gtt.  Continue to watch creat  GI: trickle tube feeds Heme: on heparin gtt ID: [email or web address] Dispo: continue ICU care   Lajuana Matte 07/30/2020 10:16 AM

## 2020-07-30 NOTE — Progress Notes (Signed)
NAMEHenrik Carpenter, MRN:  [id number], DOB:  09/08/1950, LOS: 57 ADMISSION DATE:  07/06/2020, CONSULTATION DATE: 07/28/2018 REFERRING MD: Dr. Orvan Seen, CHIEF COMPLAINT: Ventilator dependent  History of Present Illness:  Danny Carpenter is a 70 year old male with past medical history significant for hypertension, hyperlipidemia, type 2 diabetes, CVA, AAA, left foot amputation s/p fem pop bypass and heart failure who presented to the emergency department 4/21 after undergoing outpatient stress test that revealed EF of 20% and acute ischemia.  Since admission patient has been evaluated by cardiology and cardiothoracic surgery and patient underwent TEE and left heart cath.  LHC/RHC revealed severe three-vessel coronary artery disease with moderately elevated left heart and pulmonary artery pressures.  At that time patient was placed on goal-directed medical therapy for acute systolic congestive heart failure and CTS surgery was consulted for CABG potential.  Patient underwent CABG x5 with Dr. Orvan Seen 4/26, post CABG CODE BLUE called due to low flow on Impella, CTS notified.  Patient underwent bedside exploratory postoperative open heart where Dr. Julien Girt reopened the chest incision and hematoma was removed, patient's hemodynamics improved upon opening sternotomy.  PCCM consulted morning of 4/28 due to difficulty weaning ventilator  Pertinent  Medical History  Hypertension, hyperlipidemia, type 2 diabetes, CVA, AAA, left foot amputation s/p fem pop bypass and heart failure   Significant Hospital Events: Including procedures, antibiotic start and stop dates in addition to other pertinent events   . 4/21 presented to the ED for evaluation of abnormal stress test . 4/22 left and right heart cath severe multivessel disease . 4/26 CABG x5 with hemodynamic instability post requiring brief CPR with eventual return back to the OR anastomosis bleeding was seen . 4/28 remains ventilator dependent, slowly weaning  PEEP . 5/1 remains on vent, diuresing, no ready to come off   Interim History / Subjective:   Remains critically. Intubated on life support. Diuresing well.   Objective   Blood pressure 90/73, pulse 78, temperature 99.5 F (37.5 C), resp. rate (!) 21, height '6\' 1"'$  (1.854 m), weight 125.4 kg, SpO2 100 %. PAP: (39-55)/(15-35) 42/29 CVP:  [11 mmHg-26 mmHg] 18 mmHg CO:  [4.6 L/min-5.8 L/min] 4.8 L/min CI:  [2 L/min/m2-2.6 L/min/m2] 2.1 L/min/m2  Vent Mode: PRVC FiO2 (%):  [50 %-70 %] 50 % Set Rate:  [20 bmp] 20 bmp Vt Set:  [620 mL-630 mL] 630 mL PEEP:  [5 cmH20] 5 cmH20 Plateau Pressure:  [22 cmH20-28 cmH20] 22 cmH20   Intake/Output Summary (Last 24 hours) at 07/30/2020 1015 Last data filed at 07/30/2020 1000 Gross per 24 hour  Intake 3634.37 ml  Output 4040 ml  Net -405.63 ml   Filed Weights   07/26/20 0500 07/28/20 0452 07/30/20 0045  Weight: 127 kg 123.6 kg 125.4 kg    Examination: General: obese male, intuabted on life support HEENT: ETT in place  Neuro: alert voice, opens eyes  CV: RRR, impella in place PULM: BL vented breaths GI: soft nt nd  Extremities: dependent edema  Skin: no rash   Labs/imaging that I have personally reviewed    4/27 Limited echo to evaluate Impella, good positioning, EF less than 20%  4/29 CBC Plt 110 Hgb 9.7 BMP. Coox 57. CXR with stable support devices, R pleural effusion and some pulmonary edema   5/1 remains on vent. Diuresing.   Resolved Hospital Problem list     Assessment & Plan:   Acute hypoxic respiratory failure  R pleural effusion, mild pulmonary edema  -Secondary to cardiogenic shock status  post CABG P: Remains on too much support to consider extubation at this time.  Continue to wean as tolerated  Adult mech vent protocol  Diuresis continued  On impella support   Cardiogenic Shock CAD sp CABG x5 P: Impella, pressors, dig   Anemia, post op  Thrombocytopenia - stable  -Patient has received multiple blood  products post CABG hemoglobin stable -Platelet count stable at 114  P: Follow cbc  Conservative transfusion threshold   Fever -post op v infectious. No leukocytosis. Started on empiric vanc cefepime 4/29. Had 3 d cefuroxime and 2 doses of vanc before this.  P: Continue Abx  Stopped vancomycin  Continue cefepime   PAD s/p partial L foot amputation  P: Supportive care Op follow up  AAA P: BP management   DM2 -Hemoglobin A1c 11.6 P: CBG with ssi   Best practice   Diet:  Tube Feed  Pain/Anxiety/Delirium protocol (if indicated): Yes (RASS goal -1) VAP protocol (if indicated): Yes DVT prophylaxis: Contraindicated GI prophylaxis: PPI Glucose control:  SSI Yes Central venous access:  Yes, and it is still needed Arterial line:  Yes, and it is still needed Foley:  Yes, and it is still needed Mobility:  bed rest  PT consulted: N/A Last date of multidisciplinary goals of care discussion Per primary Code Status:  full code Disposition: ICU  Labs   CBC: Recent Labs  Lab 07/27/20 0304 07/27/20 0853 07/28/20 0322 07/28/20 [id number] 07/28/20 1535 07/28/20 2323 07/29/20 0358 07/29/20 0404 07/30/20 0331 07/30/20 0339  WBC 8.1  --  9.6  --  6.4  --  5.4  --  7.5  --   HGB 9.5*   < > 9.7*   < > 9.8* 8.8* 9.3* 9.2* 9.3* 9.5*  HCT 28.1*   < > 29.0*   < > 30.5* 26.0* 28.9* 27.0* 29.5* 28.0*  MCV 86.2  --  89.8  --  89.2  --  90.0  --  89.7  --   PLT 114*  --  110*  --  104*  --  90*  --  112*  --    < > = values in this interval not displayed.    Basic Metabolic Panel: Recent Labs  Lab 07/28/20 0322 07/28/20 [id number] 07/28/20 0823 07/28/20 1535 07/28/20 2323 07/29/20 0358 07/29/20 0404 07/29/20 1522 07/30/20 0331 07/30/20 0339 07/30/20 0559  NA 135   < >  --  136   < > 137 138 137  --  138 136  K 3.7   < >  --  3.7   < > 4.2 4.1 4.0  --  3.4* 3.4*  CL 101  --   --  102  --  103  --  104  --   --  102  CO2 24  --   --  25  --  22  --  22  --   --  24  GLUCOSE 227*  --    --  117*  --  199*  --  328*  --   --  333*  BUN 23  --   --  29*  --  35*  --  41*  --   --  46*  CREATININE 1.24  --   --  1.60*  --  1.82*  --  1.95*  --   --  2.07*  CALCIUM 8.0*  --   --  7.7*  --  7.3*  --  7.2*  --   --  7.4*  MG  --   --  1.8 2.6*  --  2.6*  --  2.4 2.3  --   --   PHOS  --   --   --  4.2  --  4.5  --  3.8 3.0  --  3.0   < > = values in this interval not displayed.   GFR: Estimated Creatinine Clearance: 47.4 mL/min (A) (by C-G formula based on SCr of 2.07 mg/dL (H)). Recent Labs  Lab 07/28/20 0322 07/28/20 1116 07/28/20 1535 07/29/20 0358 07/30/20 0331  PROCALCITON  --  4.06  --  7.50 5.06  WBC 9.6  --  6.4 5.4 7.5  LATICACIDVEN  --   --  1.0 0.9  --     Liver Function Tests: Recent Labs  Lab 07/08/2020 1933 07/26/20 1409 07/27/20 0304 07/30/20 0559  AST 34 26 21  --   ALT '17 13 14  '$ --   ALKPHOS 33* 28* 38  --   BILITOT 2.4* 1.2 1.1  --   PROT 4.7* 4.9* 5.0*  --   ALBUMIN 3.6 3.4* 3.1* 2.5*   No results for input(s): LIPASE, AMYLASE in the last 168 hours. No results for input(s): AMMONIA in the last 168 hours.  ABG    Component Value Date/Time   PHART 7.325 (L) 07/30/2020 0339   PCO2ART 46.3 07/30/2020 0339   PO2ART 92 07/30/2020 0339   HCO3 24.0 07/30/2020 0339   TCO2 25 07/30/2020 0339   ACIDBASEDEF 2.0 07/30/2020 0339   O2SAT 53.5 07/30/2020 0353     Coagulation Profile: Recent Labs  Lab 07/04/2020 1617 07/07/2020 1933  INR 1.4* 1.5*    Cardiac Enzymes: No results for input(s): CKTOTAL, CKMB, CKMBINDEX, TROPONINI in the last 168 hours.  HbA1C: Hgb A1c MFr Bld  Date/Time Value Ref Range Status  07/02/2020 06:28 PM 11.6 (H) 4.8 - 5.6 % Final    Comment:    (NOTE) Pre diabetes:          5.7%-6.4%  Diabetes:              >6.4%  Glycemic control for   <7.0% adults with diabetes   08/05/2019 03:09 AM 11.6 (H) 4.8 - 5.6 % Final    Comment:    (NOTE) Pre diabetes:          5.7%-6.4% Diabetes:              >6.4% Glycemic  control for   <7.0% adults with diabetes     CBG: Recent Labs  Lab 07/29/20 1522 07/29/20 1919 07/29/20 2300 07/30/20 0337 07/30/20 0734  GLUCAP 306* 340* 318* 298* 345*    This patient is critically ill with multiple organ system failure; which, requires frequent high complexity decision making, assessment, support, evaluation, and titration of therapies. This was completed through the application of advanced monitoring technologies and extensive interpretation of multiple databases. During this encounter critical care time was devoted to patient care services described in this note for 35 minutes.  [facility] 07/30/2020 10:15 AM

## 2020-07-30 DEATH — deceased

## 2020-07-31 ENCOUNTER — Inpatient Hospital Stay (HOSPITAL_COMMUNITY): Payer: No Typology Code available for payment source

## 2020-07-31 DIAGNOSIS — Z95811 Presence of heart assist device: Secondary | ICD-10-CM | POA: Diagnosis not present

## 2020-07-31 DIAGNOSIS — R570 Cardiogenic shock: Secondary | ICD-10-CM | POA: Diagnosis not present

## 2020-07-31 DIAGNOSIS — I5021 Acute systolic (congestive) heart failure: Secondary | ICD-10-CM | POA: Diagnosis not present

## 2020-07-31 DIAGNOSIS — I25700 Atherosclerosis of coronary artery bypass graft(s), unspecified, with unstable angina pectoris: Secondary | ICD-10-CM | POA: Diagnosis not present

## 2020-07-31 LAB — COOXEMETRY PANEL
Carboxyhemoglobin: 1.3 % (ref 0.5–1.5)
Methemoglobin: 1.1 % (ref 0.0–1.5)
O2 Saturation: 56.8 %
Total hemoglobin: 7.8 g/dL — ABNORMAL LOW (ref 12.0–16.0)

## 2020-07-31 LAB — POCT I-STAT 7, (LYTES, BLD GAS, ICA,H+H)
Acid-base deficit: 3 mmol/L — ABNORMAL HIGH (ref 0.0–2.0)
Acid-base deficit: 2 mmol/L (ref 0.0–2.0)
Bicarbonate: 23.6 mmol/L (ref 20.0–28.0)
Bicarbonate: 24.4 mmol/L (ref 20.0–28.0)
Calcium, Ion: 1.06 mmol/L — ABNORMAL LOW (ref 1.15–1.40)
Calcium, Ion: 1.06 mmol/L — ABNORMAL LOW (ref 1.15–1.40)
HCT: 27 % — ABNORMAL LOW (ref 39.0–52.0)
HCT: 27 % — ABNORMAL LOW (ref 39.0–52.0)
Hemoglobin: 9.2 g/dL — ABNORMAL LOW (ref 13.0–17.0)
Hemoglobin: 9.2 g/dL — ABNORMAL LOW (ref 13.0–17.0)
O2 Saturation: 97 %
O2 Saturation: 91 %
Patient temperature: 38.2
Patient temperature: 37.9
Potassium: 4.5 mmol/L (ref 3.5–5.1)
Potassium: 4.2 mmol/L (ref 3.5–5.1)
Sodium: 137 mmol/L (ref 135–145)
Sodium: 138 mmol/L (ref 135–145)
TCO2: 25 mmol/L (ref 22–32)
TCO2: 26 mmol/L (ref 22–32)
pCO2 arterial: 50.4 mmHg — ABNORMAL HIGH (ref 32.0–48.0)
pCO2 arterial: 48.4 mmHg — ABNORMAL HIGH (ref 32.0–48.0)
pH, Arterial: 7.286 — ABNORMAL LOW (ref 7.350–7.450)
pH, Arterial: 7.314 — ABNORMAL LOW (ref 7.350–7.450)
pO2, Arterial: 107 mmHg (ref 83.0–108.0)
pO2, Arterial: 69 mmHg — ABNORMAL LOW (ref 83.0–108.0)

## 2020-07-31 LAB — CBC
HCT: 27.8 % — ABNORMAL LOW (ref 39.0–52.0)
Hemoglobin: 8.7 g/dL — ABNORMAL LOW (ref 13.0–17.0)
MCH: 28.2 pg (ref 26.0–34.0)
MCHC: 31.3 g/dL (ref 30.0–36.0)
MCV: 90.3 fL (ref 80.0–100.0)
Platelets: 114 10*3/uL — ABNORMAL LOW (ref 150–400)
RBC: 3.08 MIL/uL — ABNORMAL LOW (ref 4.22–5.81)
RDW: 17.6 % — ABNORMAL HIGH (ref 11.5–15.5)
WBC: 8.5 10*3/uL (ref 4.0–10.5)
nRBC: 0.7 % — ABNORMAL HIGH (ref 0.0–0.2)

## 2020-07-31 LAB — ECHOCARDIOGRAM LIMITED
Height: 73 in
Weight: 4493.86 oz

## 2020-07-31 LAB — GLUCOSE, CAPILLARY
Glucose-Capillary: 285 mg/dL — ABNORMAL HIGH (ref 70–99)
Glucose-Capillary: 334 mg/dL — ABNORMAL HIGH (ref 70–99)
Glucose-Capillary: 343 mg/dL — ABNORMAL HIGH (ref 70–99)
Glucose-Capillary: 352 mg/dL — ABNORMAL HIGH (ref 70–99)
Glucose-Capillary: 353 mg/dL — ABNORMAL HIGH (ref 70–99)
Glucose-Capillary: 386 mg/dL — ABNORMAL HIGH (ref 70–99)
Glucose-Capillary: 392 mg/dL — ABNORMAL HIGH (ref 70–99)

## 2020-07-31 LAB — BASIC METABOLIC PANEL
Anion gap: 8 (ref 5–15)
BUN: 67 mg/dL — ABNORMAL HIGH (ref 8–23)
CO2: 24 mmol/L (ref 22–32)
Calcium: 7.1 mg/dL — ABNORMAL LOW (ref 8.9–10.3)
Chloride: 102 mmol/L (ref 98–111)
Creatinine, Ser: 2.61 mg/dL — ABNORMAL HIGH (ref 0.61–1.24)
GFR, Estimated: 26 mL/min — ABNORMAL LOW (ref >60)
Glucose, Bld: 394 mg/dL — ABNORMAL HIGH (ref 70–99)
Potassium: 4.2 mmol/L (ref 3.5–5.1)
Sodium: 134 mmol/L — ABNORMAL LOW (ref 135–145)

## 2020-07-31 LAB — RENAL FUNCTION PANEL
Albumin: 2.2 g/dL — ABNORMAL LOW (ref 3.5–5.0)
Anion gap: 9 (ref 5–15)
BUN: 59 mg/dL — ABNORMAL HIGH (ref 8–23)
CO2: 24 mmol/L (ref 22–32)
Calcium: 7.4 mg/dL — ABNORMAL LOW (ref 8.9–10.3)
Chloride: 103 mmol/L (ref 98–111)
Creatinine, Ser: 2.48 mg/dL — ABNORMAL HIGH (ref 0.61–1.24)
GFR, Estimated: 28 mL/min — ABNORMAL LOW (ref >60)
Glucose, Bld: 375 mg/dL — ABNORMAL HIGH (ref 70–99)
Phosphorus: 2.1 mg/dL — ABNORMAL LOW (ref 2.5–4.6)
Potassium: 4.2 mmol/L (ref 3.5–5.1)
Sodium: 136 mmol/L (ref 135–145)

## 2020-07-31 LAB — HEPARIN LEVEL (UNFRACTIONATED)
Heparin Unfractionated: 0.1 IU/mL — ABNORMAL LOW (ref 0.30–0.70)
Heparin Unfractionated: 0.12 IU/mL — ABNORMAL LOW (ref 0.30–0.70)

## 2020-07-31 LAB — LACTATE DEHYDROGENASE: LDH: 329 U/L — ABNORMAL HIGH (ref 98–192)

## 2020-07-31 LAB — TRIGLYCERIDES: Triglycerides: 134 mg/dL (ref <150)

## 2020-07-31 MED ORDER — NOREPINEPHRINE 16 MG/250ML-% IV SOLN
0.0000 ug/min | INTRAVENOUS | Status: DC
Start: 2020-07-31 — End: 2020-08-16
  Administered 2020-07-31: 2 ug/min via INTRAVENOUS
  Administered 2020-08-03: 1 ug/min via INTRAVENOUS
  Administered 2020-08-08: 12 ug/min via INTRAVENOUS
  Administered 2020-08-09: 15 ug/min via INTRAVENOUS
  Administered 2020-08-09: 21.333 ug/min via INTRAVENOUS
  Administered 2020-08-10: 8 ug/min via INTRAVENOUS
  Administered 2020-08-11: 6 ug/min via INTRAVENOUS
  Administered 2020-08-16: 2 ug/min via INTRAVENOUS
  Filled 2020-07-31 (×10): qty 250

## 2020-07-31 MED ORDER — DEXTROSE 10 % IV SOLN: INTRAVENOUS | Status: DC

## 2020-07-31 MED ORDER — INSULIN ASPART 100 UNIT/ML IJ SOLN
3.0000 [IU] | INTRAMUSCULAR | Status: DC
Start: 2020-07-31 — End: 2020-08-01
  Administered 2020-07-31 (×2): 9 [IU] via SUBCUTANEOUS

## 2020-07-31 MED ORDER — INSULIN DETEMIR 100 UNIT/ML ~~LOC~~ SOLN
10.0000 [IU] | Freq: Two times a day (BID) | SUBCUTANEOUS | Status: DC
  Administered 2020-07-31: 10 [IU] via SUBCUTANEOUS
  Filled 2020-07-31 (×3): qty 0.1

## 2020-07-31 MED ORDER — POLYETHYLENE GLYCOL 3350 17 G PO PACK: 17.0000 g | PACK | Freq: Every day | ORAL | Status: DC

## 2020-07-31 MED ORDER — FENTANYL 2500MCG IN NS 250ML (10MCG/ML) PREMIX INFUSION
25.0000 ug/h | INTRAVENOUS | Status: DC
  Administered 2020-07-31: 25 ug/h via INTRAVENOUS
  Administered 2020-07-31 – 2020-08-02 (×2): 150 ug/h via INTRAVENOUS
  Administered 2020-08-02: 50 ug/h via INTRAVENOUS
  Administered 2020-08-03: 175 ug/h via INTRAVENOUS
  Administered 2020-08-04 – 2020-08-05 (×5): 200 ug/h via INTRAVENOUS
  Administered 2020-08-06 – 2020-08-07 (×2): 100 ug/h via INTRAVENOUS
  Administered 2020-08-08: 125 ug/h via INTRAVENOUS
  Filled 2020-07-31 (×13): qty 250

## 2020-07-31 MED ORDER — INSULIN ASPART 100 UNIT/ML IJ SOLN
6.0000 [IU] | INTRAMUSCULAR | Status: DC
  Administered 2020-07-31 (×3): 6 [IU] via SUBCUTANEOUS

## 2020-07-31 MED ORDER — INSULIN ASPART 100 UNIT/ML IJ SOLN
12.0000 [IU] | Freq: Once | INTRAMUSCULAR | Status: AC
  Administered 2020-07-31: 12 [IU] via SUBCUTANEOUS

## 2020-07-31 MED ORDER — FENTANYL CITRATE (PF) 100 MCG/2ML IJ SOLN
25.0000 ug | Freq: Once | INTRAMUSCULAR | Status: AC
Start: 2020-07-31 — End: 2020-07-31
  Administered 2020-07-31: 25 ug via INTRAVENOUS
  Filled 2020-07-31: qty 2

## 2020-07-31 MED ORDER — INSULIN DETEMIR 100 UNIT/ML ~~LOC~~ SOLN
20.0000 [IU] | Freq: Two times a day (BID) | SUBCUTANEOUS | Status: DC
  Administered 2020-07-31: 20 [IU] via SUBCUTANEOUS
  Filled 2020-07-31 (×2): qty 0.2

## 2020-07-31 MED ORDER — INSULIN GLARGINE 100 UNIT/ML ~~LOC~~ SOLN
10.0000 [IU] | Freq: Once | SUBCUTANEOUS | Status: AC
  Administered 2020-07-31: 10 [IU] via SUBCUTANEOUS
  Filled 2020-07-31: qty 0.1

## 2020-07-31 MED ORDER — DOCUSATE SODIUM 50 MG/5ML PO LIQD: 100.0000 mg | Freq: Two times a day (BID) | ORAL | Status: DC

## 2020-07-31 MED ORDER — INSULIN ASPART 100 UNIT/ML IJ SOLN
9.0000 [IU] | Freq: Once | INTRAMUSCULAR | Status: AC
  Administered 2020-07-31: 9 [IU] via SUBCUTANEOUS

## 2020-07-31 MED ORDER — FENTANYL BOLUS VIA INFUSION
25.0000 ug | INTRAVENOUS | Status: DC | PRN
  Administered 2020-07-31: 100 ug via INTRAVENOUS
  Administered 2020-08-02: 50 ug via INTRAVENOUS
  Administered 2020-08-02: 75 ug via INTRAVENOUS
  Administered 2020-08-02: 50 ug via INTRAVENOUS
  Administered 2020-08-02: 75 ug via INTRAVENOUS
  Administered 2020-08-02: 50 ug via INTRAVENOUS
  Administered 2020-08-02: 75 ug via INTRAVENOUS
  Administered 2020-08-02 (×2): 50 ug via INTRAVENOUS
  Administered 2020-08-02 (×2): 25 ug via INTRAVENOUS
  Administered 2020-08-03 (×3): 50 ug via INTRAVENOUS
  Administered 2020-08-03: 75 ug via INTRAVENOUS
  Administered 2020-08-03 (×2): 50 ug via INTRAVENOUS
  Administered 2020-08-07: 25 ug via INTRAVENOUS
  Filled 2020-07-31: qty 100

## 2020-07-31 MED ORDER — ALBUMIN HUMAN 25 % IV SOLN
12.5000 g | Freq: Once | INTRAVENOUS | Status: AC
  Administered 2020-07-31: 12.5 g via INTRAVENOUS
  Filled 2020-07-31: qty 50

## 2020-07-31 MED ORDER — ALBUMIN HUMAN 5 % IV SOLN
12.5000 g | Freq: Once | INTRAVENOUS | Status: AC
  Administered 2020-07-31: 12.5 g via INTRAVENOUS
  Filled 2020-07-31: qty 250

## 2020-07-31 NOTE — Progress Notes (Signed)
Pharmacy Antibiotic Note  Danny Carpenter is a 70 y.o. male admitted on 07/14/2020 with NSTEMI s/p CABG with Impella support. Now with concerns for  pneumonia.  Pharmacy has been consulted for cefepime dosing. Cultures unrevealing, Cr continues to trend up.  Plan: Cefepime 2g q12h Follow Cr    Height: '6\' 1"'$  (185.4 cm) Weight: 127.4 kg (280 lb 13.9 oz) IBW/kg (Calculated) : 79.9  Temp (24hrs), Avg:100.1 F (37.8 C), Min:99.32 F (37.4 C), Max:100.76 F (38.2 C)  Recent Labs  Lab 07/28/20 0322 07/28/20 1535 07/29/20 0358 07/29/20 1522 07/30/20 0331 07/30/20 0559 07/30/20 1655 07/31/20 0337  WBC 9.6 6.4 5.4  --  7.5  --   --  8.5  CREATININE 1.24 1.60* 1.82* 1.95*  --  2.07* 2.25* 2.48*  LATICACIDVEN  --  1.0 0.9  --   --   --   --   --     Estimated Creatinine Clearance: 39.9 mL/min (A) (by C-G formula based on SCr of 2.48 mg/dL (H)).    No Known Allergies  Antimicrobials this admission: Vanc 4/26 x2, 4/29 >>  Cefuroxime 4/26 >> 4/27 Cefepime 4/29>>  Dose adjustments this admission: N/A  Microbiology results: 4/28 BCx:   4/28 Sputum:   4/25 MRSA PCR: neg  Arrie Senate, PharmD, BCPS, [facility] Clinical Pharmacist [phone] Please check AMION for all [place] numbers 07/31/2020

## 2020-07-31 NOTE — Progress Notes (Signed)
ANTICOAGULATION CONSULT NOTE - Follow Up Consult  Pharmacy Consult for heparin Indication: Impella  Labs: Recent Labs    07/29/20 0358 07/29/20 0404 07/30/20 0331 07/30/20 0339 07/30/20 0559 07/30/20 1655 07/31/20 0131 07/31/20 0324 07/31/20 0337 07/31/20 0338  HGB 9.3*   < > 9.3*   < >  --   --  9.2* 9.2* 8.7*  --   HCT 28.9*   < > 29.5*   < >  --   --  27.0* 27.0* 27.8*  --   PLT 90*  --  112*  --   --   --   --   --  114*  --   HEPARINUNFRC <0.10*   < > <0.10*  --   --  <0.10*  --   --   --  0.10*  CREATININE 1.82*   < >  --   --  2.07* 2.25*  --   --  2.48*  --    < > = values in this interval not displayed.    Assessment: 70yo male remains subtherapeutic on heparin after rate changes though now getting closer to goal; no gtt issues or signs of bleeding per RN.  Goal of Therapy:  aPTT 0.2-0.3 seconds   Plan:  Will increase systemic heparin gtt conservatively to 900 units/hr (for total ~1500 units/hr with heparin purge solution) and check level in 8 hours.    Wynona Neat, PharmD, BCPS  07/31/2020,4:57 AM

## 2020-07-31 NOTE — Progress Notes (Addendum)
[facility] Progress Note Patient Name: Nihit Hoying DOB: November 25, 1950 MRN: [id number]   Date of Service  07/31/2020  HPI/Events of Note  RN called for vent dyscynchrony. ABG noted on PRVC VT 630, RR 20, peep 5, Fio2 40. Seen on camera. Uncomfortable, breathing at 28-29. Waking up more , per RN, and during those episodes he is very asynchronous with the vent. O2 sat remains 99. Has been getting PRN oxycodone as well as q1h morphine but not helpful.   eICU Interventions  Start fentanyl drip, he is only on precedex and multiple PRN opioid pushes have not worked ABG to be repeated at 3 am. Pt already overbreathing the vent so needs better sedation at this time.       Intervention Category Major Interventions: Delirium, psychosis, severe agitation - evaluation and management  Ashad Fawbush G Malayzia Laforte 07/31/2020, 2:05 AM   4:15 am ABG noted. Seen on camera. Much better and calm and comfortable. Bedside had increased Fio2 to 50, O2 sat Is 100.

## 2020-07-31 NOTE — Progress Notes (Signed)
  Echocardiogram 2D Echocardiogram has been performed.  Merrie Roof F 07/31/2020, 10:56 AM

## 2020-07-31 NOTE — Progress Notes (Signed)
ANTICOAGULATION CONSULT NOTE  Pharmacy Consult for Heparin Indication: Impella 5.5  No Known Allergies  Patient Measurements: Height: '6\' 1"'$  ([id number] cm) Weight: 127.4 kg (280 lb 13.9 oz) IBW/kg (Calculated) : 79.9 Heparin Dosing Weight: 102.8 kg  Vital Signs: Temp: 99.14 F (37.3 C) (05/02 1105) Temp Source: Core (05/02 0800) BP: 114/64 (05/02 1105) Pulse Rate: 92 (05/02 1105)  Labs: Recent Labs    07/29/20 0358 07/29/20 0404 07/30/20 0331 07/30/20 0339 07/30/20 0559 07/30/20 1655 07/31/20 0131 07/31/20 0324 07/31/20 0337 07/31/20 0338 07/31/20 1253  HGB 9.3*   < > 9.3*   < >  --   --  9.2* 9.2* 8.7*  --   --   HCT 28.9*   < > 29.5*   < >  --   --  27.0* 27.0* 27.8*  --   --   PLT 90*  --  112*  --   --   --   --   --  114*  --   --   HEPARINUNFRC <0.10*   < > <0.10*  --   --  <0.10*  --   --   --  0.10* 0.12*  CREATININE 1.82*   < >  --   --  2.07* 2.25*  --   --  2.48*  --   --    < > = values in this interval not displayed.    Estimated Creatinine Clearance: 39.9 mL/min (A) (by C-G formula based on SCr of 2.48 mg/dL (H)).   Assessment: 70 yo M presents with NSTEMI and multivessel CAD, now s/p CABG with Impella support. Pharmacy asked to manage systemic heparin.  Impella at P7 with purge flow 12.4 ml/hr (620 units/h). Systemic heparin infusing at 900 units/h. Heparin level below goal at 0.12, CBC stable, LDH up slightly.  Goal of Therapy:  Heparin level 0.2-0.3 units/ml Monitor platelets by anticoagulation protocol: Yes   Plan:  Continue heparin purge solution (50 units/ml) Increase IV heparin to 1000 units/hr Daily heparin level and CBC   Arrie Senate, PharmD, [place], [facility] Clinical Pharmacist [phone] Please check AMION for all [facility] numbers 07/31/2020

## 2020-07-31 NOTE — Progress Notes (Addendum)
Patient ID: Danny Carpenter, male   DOB: Aug 18, 1950, 70 y.o.   MRN: [id number]     Advanced Heart Failure Rounding Note  PCP-Cardiologist: No primary care provider on file.   Subjective:    4/26 S/P CABG  Remains intubated/sedated.   On NE 2.6, VP 0.03 and milrinone 0.25 + Lasix gtt at 10/hr.   Co-ox marginal at 57%  Good response to lasix gtt, -4.4L in UOP yesterday. Doubt today's wt is accurate. CVP 16-17  Scr continues to rise, 2.07>>2.25>>2.48   Febrile overnight. mTemp 100.7. WBC normal at 8.5   [place] numbers: CVP 16-17 PA 42/24 (31) CI 2.95 Co-ox 57%  Impella P7 Flow 3.8 No alarms Waveforms ok  LDH 209 => 247 => 215 => 247=>392   Objective:   Weight Range: 127.4 kg Body mass index is 37.06 kg/m.   Vital Signs:   Temp:  [99.32 F (37.4 C)-100.76 F (38.2 C)] 99.5 F (37.5 C) (05/02 0630) Pulse Rate:  [51-92] 62 (05/02 0630) Resp:  [10-27] 20 (05/02 0630) BP: (83-121)/(64-110) 110/71 (05/02 0000) SpO2:  [95 %-100 %] 99 % (05/02 0630) Arterial Line BP: (83-137)/(58-79) 129/73 (05/02 0630) FiO2 (%):  [40 %-60 %] 60 % (05/02 0352) Weight:  [127.4 kg] 127.4 kg (05/02 0400) Last BM Date: 07/30/20  Weight change: Filed Weights   07/28/20 0452 07/30/20 0045 07/31/20 0400  Weight: 123.6 kg 125.4 kg 127.4 kg    Intake/Output:   Intake/Output Summary (Last 24 hours) at 07/31/2020 0708 Last data filed at 07/31/2020 0600 Gross per 24 hour  Intake 2824.06 ml  Output 4675 ml  Net -1850.94 ml      Physical Exam   General:  Critically ill, Intubated/sedated  HEENT: normal + ETT + Cor Trak  Neck: supple.  JVP elevated to jaw. Carotids 2+ bilat; no bruits. No lymphadenopathy or thryomegaly appreciated. Cor: PMI nondisplaced. RRR. No MRG. Sternal site stable. + CTs  Lungs: intubated and clear  Abdomen: obese soft, nontender, nondistended. No hepatosplenomegaly. No bruits or masses. Good bowel sounds. Extremities: no cyanosis, clubbing, rash, 3+ edema up to  thighs, s/p left transmetatarsal amputation  Neuro: intubated and sedated    Telemetry   A-V Paced 60s Personally reviewed   Labs    CBC Recent Labs    07/30/20 0331 07/30/20 0339 07/31/20 0324 07/31/20 0337  WBC 7.5  --   --  8.5  HGB 9.3*   < > 9.2* 8.7*  HCT 29.5*   < > 27.0* 27.8*  MCV 89.7  --   --  90.3  PLT 112*  --   --  114*   < > = values in this interval not displayed.   Basic Metabolic Panel Recent Labs    07/29/20 1522 07/30/20 0331 07/30/20 0339 07/30/20 0559 07/30/20 1655 07/31/20 0131 07/31/20 0324 07/31/20 0337  NA 137  --    < > 136 136   < > 138 136  K 4.0  --    < > 3.4* 4.0   < > 4.2 4.2  CL 104  --   --  102 103  --   --  103  CO2 22  --   --  24 24  --   --  24  GLUCOSE 328*  --   --  333* 312*  --   --  375*  BUN 41*  --   --  46* 51*  --   --  59*  CREATININE 1.95*  --   --  2.07* 2.25*  --   --  2.48*  CALCIUM 7.2*  --   --  7.4* 7.3*  --   --  7.4*  MG 2.4 2.3  --   --   --   --   --   --   PHOS 3.8 3.0  --  3.0  --   --   --  2.1*   < > = values in this interval not displayed.   Liver Function Tests Recent Labs    07/30/20 0559 07/31/20 0337  ALBUMIN 2.5* 2.2*   No results for input(s): LIPASE, AMYLASE in the last 72 hours. Cardiac Enzymes No results for input(s): CKTOTAL, CKMB, CKMBINDEX, TROPONINI in the last 72 hours.  BNP: BNP (last 3 results) Recent Labs    07/11/2020 1211  BNP 465.5*    ProBNP (last 3 results) No results for input(s): PROBNP in the last 8760 hours.   D-Dimer No results for input(s): DDIMER in the last 72 hours. Hemoglobin A1C No results for input(s): HGBA1C in the last 72 hours. Fasting Lipid Panel Recent Labs    07/31/20 0338  TRIG 134   Thyroid Function Tests No results for input(s): TSH, T4TOTAL, T3FREE, THYROIDAB in the last 72 hours.  Invalid input(s): FREET3  Other results:   Imaging    No results found.   Medications:     Scheduled Medications: . sodium chloride    Intravenous Once  . sodium chloride   Intravenous Once  . acetaminophen  1,000 mg Oral Q6H   Or  . acetaminophen (TYLENOL) oral liquid 160 mg/5 mL  1,000 mg Per Tube Q6H  . amiodarone  200 mg Per Tube BID  . aspirin EC  325 mg Oral Daily   Or  . aspirin  324 mg Per Tube Daily  . bisacodyl  10 mg Oral Daily   Or  . bisacodyl  10 mg Rectal Daily  . chlorhexidine gluconate (MEDLINE KIT)  15 mL Mouth Rinse BID  . Chlorhexidine Gluconate Cloth  6 each Topical Daily  . docusate  100 mg Per Tube BID  . docusate  200 mg Per Tube Daily  . feeding supplement (PROSource TF)  45 mL Per Tube QID  . insulin aspart  0-15 Units Subcutaneous Q4H  . mouth rinse  15 mL Mouth Rinse 10 times per day  . pantoprazole sodium  40 mg Per Tube Daily  . polyethylene glycol  17 g Per Tube Daily  . rosuvastatin  40 mg Per Tube Daily  . sodium chloride flush  3 mL Intravenous Q12H    Infusions: . sodium chloride Stopped (07/28/20 0905)  . sodium chloride Stopped (07/28/20 0851)  . ceFEPime (MAXIPIME) IV Stopped (07/30/20 2157)  . dexmedetomidine (PRECEDEX) IV infusion 1.2 mcg/kg/hr (07/31/20 0600)  . electrolyte-A Stopped (07/29/20 0754)  . epinephrine Stopped (07/28/20 0818)  . feeding supplement (VITAL 1.5 CAL) 55 mL/hr at 07/31/20 0600  . fentaNYL infusion INTRAVENOUS 100 mcg/hr (07/31/20 0600)  . furosemide (LASIX) 200 mg in dextrose 5% 100 mL (59m/mL) infusion 10 mg/hr (07/31/20 0600)  . heparin 900 Units/hr (07/31/20 0600)  . impella catheter heparin 50 unit/mL in dextrose 5% 11.9 mL/hr at 07/31/20 0148  . lactated ringers    . lactated ringers    . lactated ringers Stopped (07/28/20 1139)  . milrinone 0.25 mcg/kg/min (07/31/20 0600)  . norepinephrine (LEVOPHED) Adult infusion 3 mcg/min (07/31/20 0600)  . propofol (DIPRIVAN) infusion Stopped (07/28/20 0631)  . vasopressin 0.03 Units/min (07/31/20 0600)  PRN Medications: sodium chloride, dextrose, fentaNYL, lactated ringers, metoprolol  tartrate, ondansetron (ZOFRAN) IV, sodium chloride flush, traMADol  Assessment/Plan   1. Cardiogenic shock: Ischemic cardiomyopathy, post-CABG on 4/26.  He has Impella 5.5 in place, at P7 with adequate flow and no alarms.  Limited echo 4/29 with EF< 20%, the RV appears normal in size with severe systolic dysfunction. Hemodynamically improved with VP pacing and current support. CI 2.9 by thermodilution with co-ox marginal at 57% on NE 2.6 + Milrinone 0.25 + VP 0.03.  Now starting to diurese w/ lasix gtt though creatinine continues to rise  - Continue lasix gtt at 10. Follow renal function closely. If worsens, may end up on CVVH but hopefully can avoid.  - Maintain MAP, continue vasopressin 0.03 and NE. Keep MAP >= 70 - Continue current milrinone.  - Continue Impella at P7. Has heparin in purge and on low dose systemic heparin given post-op bleeding.  Wean vasoactive meds before Impella.   - Off digoxin with creatinine rise.  2. CAD: s/p CABG x 5 with LIMA-LAD, seq SVG-D1/ramus, seq SVG-PDA/PLV.   - ASA - Crestor - no change 3. Anemia: Post-op bleeding, back to OR with multiple products given on 4/26 post-CABG.  Hgb stable down slightly today 9.2>>8.7 4. Thrombocytopenia: Plts 90K -> 112->114K  today, suspect low post-op/post-surgical bleeding and multiple blood products as well as sepsis.  - Follow.  5. PAD: Extensive history.  6. AAA: Monitoring as outpatient.  7. Type 2 DM: Insulin.  Hgb A1c was 11.6, poor control.  8. Atrial fibrillation: Rate-controlled.  Continue per tube amiodarone for now.   9. Neuro: intubated and sedated  10. ID: Febrile overnight, mTemp 100.7 with normal WBCs.  Cultures NGTD.  PCT 7.5 on 4/29.  Currently on cefepime.  - Continue cefepime.  11. Acute hypoxemic respiratory failure: Pulmonary edema, possible PNA.  On vent, unable to wean just yet with marked volume overload.   - Per CCM.  12. AKI: Creatinine up 1.8 -> 2.07->2.3->2.5 likely with ATN from hypotension.   He is volume overloaded and MAP now stable. Continue lasix gtt. Keep MAP >= 70. If this is unsuccessful and creatinine continues to rise, may need CVVH.    Lyda Jester, PA-C  07/31/2020 7:08 AM  Patient seen with PA, agree with the above note.   Good diuresis yesterday but CVP on my read still around 19.  MAP and CO stable today on milrinone 0.25, vasopressin 0.03, NE 3. Good UOP yesterday, I/O net 2029 negative.  Impella remains at P7, on heparin gtt. LDH mildly higher.   Tm 100.8, remains on cefepime.  PCT 7.5 => 5.06.   Sedated on vent, not awake this morning.   General: NAD on vent Neck: Thick, JVP difficult but elevated, no thyromegaly or thyroid nodule.  Lungs: Clear to auscultation bilaterally with normal respiratory effort. CV: Nondisplaced PMI.  Heart regular S1/S2, no S3/S4, no murmur.  1+ edema to knees.   Abdomen: Soft, nontender, no hepatosplenomegaly, no distention.  Skin: Intact without lesions or rashes.  Neurologic: Sedated. Extremities: No clubbing or cyanosis.  HEENT: Normal.   Suspect combination of septic/cardiogenic shock.  MAP maintained on vasopressin and NE, can wean as BP allows.   CO maintained with Impella + milrinone 0.25.   - Continue Impella at P7, wean vasoactive gtts first.  Continue heparin gtt.  LDH up some, will get limited echo for device position today.  - CVP remains 19-20, he is diuresing well on Lasix gtt at  10 mg/hr despite rise in creatinine to 2.48. Given marked volume overload, continue Lasix gtt today.   Rise in creatinine concerning though still making vigorous urine.  MAP is stable. Suspect he has a component of ATN from earlier in hospitalization.  Hopefully can manage without CVVH.   Continue antibiotics for suspected RLL PNA, Tm 100.7, PCT 5.06 yesterday.  Continue cefepime, ?broaden/change (per CCM).   Remains intubated/sedated.  Will try to awaken today.  Continue diuresis + antibiotic regimen, may need trach if cannot extubate  soon.   Currently v-paced, has underlying regular rhythm but cannot see P waves.  Will get ECG of underlying rhythm.   CRITICAL CARE Performed by: Loralie Champagne  Total critical care time: 35 minutes  Critical care time was exclusive of separately billable procedures and treating other patients.  Critical care was necessary to treat or prevent imminent or life-threatening deterioration.  Critical care was time spent personally by me on the following activities: development of treatment plan with patient and/or surrogate as well as nursing, discussions with consultants, evaluation of patient's response to treatment, examination of patient, obtaining history from patient or surrogate, ordering and performing treatments and interventions, ordering and review of laboratory studies, ordering and review of radiographic studies, pulse oximetry and re-evaluation of patient's condition.  Loralie Champagne 07/31/2020 7:52 AM

## 2020-07-31 NOTE — Progress Notes (Signed)
TCTS BRIEF SICU PROGRESS NOTE  6 Days Post-Op  S/P Procedure(s) (LRB): EXPLORATION POST OPERATIVE OPEN HEART (N/A)   Essentially stable day although low grade fever and worsening hypoglycemia AV paced w/ stable hemodynamics Impella '@P8'$  low dose levophed and milrinone UOP 75-100 mL/hr on lasix drip  Plan: Continue current plan  Rexene Alberts, MD 07/31/2020 6:23 PM

## 2020-07-31 NOTE — Progress Notes (Signed)
Inpatient Diabetes Program Recommendations  AACE/ADA: New Consensus Statement on Inpatient Glycemic Control   Target Ranges:  Prepandial:   less than 140 mg/dL      Peak postprandial:   less than 180 mg/dL (1-2 hours)      Critically ill patients:  140 - 180 mg/dL   Results for Danny Carpenter, Danny Carpenter (MRN [id number]) as of 07/31/2020 09:47  Ref. Range 07/30/2020 07:34 07/30/2020 11:36 07/30/2020 16:01 07/30/2020 19:20 07/31/2020 00:20 07/31/2020 04:18 07/31/2020 07:30  Glucose-Capillary Latest Ref Range: 70 - 99 mg/dL 345 (H) 317 (H) 277 (H) 334 (H) 392 (H) 334 (H) 285 (H)   Review of Glycemic Control  Diabetes history: DM2 Outpatient Diabetes medications: Tresiba 62 units QAM, Metformin 1000 mg BID Current orders for Inpatient glycemic control: Novolog 0-15 units Q4H; Vital @ 55 ml/hr  Inpatient Diabetes Program Recommendations:    Insulin: Please discontinue SQ insulin and use ICU Glycemic Control Phase 2 IV insulin to get glucose under better control and help determine insulin needs.  Thanks, Barnie Alderman, RN, MSN, CDE Diabetes Coordinator Inpatient Diabetes Program [phone] (Team Pager from 8am to 5pm)

## 2020-07-31 NOTE — Progress Notes (Signed)
NAMEAlta Carpenter, MRN:  [id number], DOB:  [birth year], LOS: 25 ADMISSION DATE:  07/22/2020, CONSULTATION DATE: 07/28/2018 REFERRING MD: Dr. Orvan Seen, CHIEF COMPLAINT: Ventilator dependent  History of Present Illness:  Danny Carpenter is a 70 year old male with past medical history significant for hypertension, hyperlipidemia, type 2 diabetes, CVA, AAA, left foot amputation s/p fem pop bypass and heart failure who presented to the emergency department 4/21 after undergoing outpatient stress test that revealed EF of 20% and acute ischemia.  Since admission patient has been evaluated by cardiology and cardiothoracic surgery and patient underwent TEE and left heart cath.  LHC/RHC revealed severe three-vessel coronary artery disease with moderately elevated left heart and pulmonary artery pressures.  At that time patient was placed on goal-directed medical therapy for acute systolic congestive heart failure and CTS surgery was consulted for CABG potential.  Patient underwent CABG x5 with Dr. Orvan Seen 4/26, post CABG CODE BLUE called due to low flow on Impella, CTS notified.  Patient underwent bedside exploratory postoperative open heart where Dr. Julien Girt reopened the chest incision and hematoma was removed, patient's hemodynamics improved upon opening sternotomy.  PCCM consulted morning of 4/28 due to difficulty weaning ventilator  Pertinent  Medical History  Hypertension, hyperlipidemia, type 2 diabetes, CVA, AAA, left foot amputation s/p fem pop bypass and heart failure   Significant Hospital Events: Including procedures, antibiotic start and stop dates in addition to other pertinent events   . 4/21 presented to the ED for evaluation of abnormal stress test . 4/22 left and right heart cath severe multivessel disease . 4/26 CABG x5 with hemodynamic instability post requiring brief CPR with eventual return back to the OR anastomosis bleeding was seen . 4/28 remains ventilator dependent, slowly weaning  PEEP . 5/1 remains on vent, diuresing, no ready to come off   Interim History / Subjective:   Remains critically. Intubated on life support. Diuresing well.  Weaning vasopressors.   Objective   Blood pressure 130/73, pulse 89, temperature 99.32 F (37.4 C), resp. rate (!) 21, height '6\' 1"'$  (1.854 m), weight 127.4 kg, SpO2 100 %. PAP: (39-54)/(21-38) 41/24 CVP:  [15 mmHg-29 mmHg] 17 mmHg CO:  [4.5 L/min-6.8 L/min] 4.5 L/min CI:  [2 L/min/m2-3 L/min/m2] 2 L/min/m2  Vent Mode: PRVC FiO2 (%):  [40 %-60 %] 60 % Set Rate:  [20 bmp] 20 bmp Vt Set:  [630 mL] 630 mL PEEP:  [5 cmH20] 5 cmH20 Plateau Pressure:  [21 cmH20-33 cmH20] 29 cmH20   Intake/Output Summary (Last 24 hours) at 07/31/2020 0836 Last data filed at 07/31/2020 0800 Gross per 24 hour  Intake 2907.13 ml  Output 4925 ml  Net -2017.87 ml   Filed Weights   07/28/20 0452 07/30/20 0045 07/31/20 0400  Weight: 123.6 kg 125.4 kg 127.4 kg    Examination: General: obese male, intubated and sedated.  HEENT: ETT in place, NGT small bore feeding tube. No pressure injury.  Neuro: sedated, RASS -4, minimal grimace to painful stimulation.  CV: well-healed sternotomy scar. HS normal, no rub. Extremities are cool with prolonged capillary refill.  PULM: normal vesicular breath sounds bilaterally with crackles at bases.  GI: soft, mild distension, rare bowel sounds. Rectal tube in place. GU: Foley catheter in place, marked scrotal edema.  Extremities: dependent edema, L forefoot amputation stump.  Skin: no rash, all line sites intact.  POC echo shows decreased LV function. Mildly increased LV size. Impella tip at 4-5cm   Labs/imaging that I have personally reviewed    4/27 Limited echo  to evaluate Impella, good positioning, EF less than 20%  4/29 CBC Plt 110 Hgb 9.7 BMP. Coox 57. CXR with stable support devices, R pleural effusion and some pulmonary edema   5/1 remains on vent. Diuresing.   Resolved Hospital Problem list      Assessment & Plan:   Critically ill due to acute hypoxic respiratory failure  R pleural effusion, mild pulmonary edema  Cardiogenic Shock CAD sp CABG x5 AKI Anemia, post op  Thrombocytopenia - stable  Fever PAD s/p partial L foot amputation  AAA Poorly controlled DM2 with microvascular complications.   Plan:  - Wean FiO2, can use increased PEEP up to 10 if needed for hypoxia.  - Full support as cardiac status precludes extubation - Wean vasopressors to off to decrease afterload and maximize pump flow and promote diuresis as estimated LVEDP remains high.  - Continue current inotropic and MCS.  - Optimize glycemic control.  - Continue diuresis, follow creatinine - likely multifactorial with significant cardio-renal component.  - Cultures negative, will set 7day course of antibiotics.  Best practice   Diet:  Tube Feed  Pain/Anxiety/Delirium protocol (if indicated): Yes (RASS goal -2) continue current strategy VAP protocol (if indicated): Yes DVT prophylaxis: Systemic AC - Heparin in Impella purge. GI prophylaxis: PPI Glucose control:  SSI Yes - add tube feed insulin and Lantus.  Central venous access:  Yes, and it is still needed Arterial line:  Yes, and it is still needed Foley:  Yes, and it is still needed Mobility:  bed rest  PT consulted: N/A Last date of multidisciplinary goals of care discussion Per primary Code Status:  full code Disposition: ICU  Labs   CBC: Recent Labs  Lab 07/28/20 0322 07/28/20 0614 07/28/20 1535 07/28/20 2323 07/29/20 0358 07/29/20 0404 07/30/20 0331 07/30/20 0339 07/31/20 0131 07/31/20 0324 07/31/20 0337  WBC 9.6  --  6.4  --  5.4  --  7.5  --   --   --  8.5  HGB 9.7*   < > 9.8*   < > 9.3*   < > 9.3* 9.5* 9.2* 9.2* 8.7*  HCT 29.0*   < > 30.5*   < > 28.9*   < > 29.5* 28.0* 27.0* 27.0* 27.8*  MCV 89.8  --  89.2  --  90.0  --  89.7  --   --   --  90.3  PLT 110*  --  104*  --  90*  --  112*  --   --   --  114*   < > = values in  this interval not displayed.    Basic Metabolic Panel: Recent Labs  Lab 07/28/20 0322 07/28/20 [id number] 07/28/20 1535 07/28/20 2323 07/29/20 0358 07/29/20 0404 07/29/20 1522 07/30/20 0331 07/30/20 [id number] 07/30/20 0559 07/30/20 1655 07/31/20 0131 07/31/20 0324 07/31/20 0337  NA  --   --  136   < > 137   < > 137  --    < > 136 136 137 138 136  K  --   --  3.7   < > 4.2   < > 4.0  --    < > 3.4* 4.0 4.5 4.2 4.2  CL  --   --  102  --  103  --  104  --   --  102 103  --   --  103  CO2  --   --  25  --  22  --  22  --   --  24 24  --   --  24  GLUCOSE  --   --  117*  --  199*  --  328*  --   --  333* 312*  --   --  375*  BUN  --   --  29*  --  35*  --  41*  --   --  46* 51*  --   --  59*  CREATININE  --   --  1.60*  --  1.82*  --  1.95*  --   --  2.07* 2.25*  --   --  2.48*  CALCIUM  --   --  7.7*  --  7.3*  --  7.2*  --   --  7.4* 7.3*  --   --  7.4*  MG  --  1.8 2.6*  --  2.6*  --  2.4 2.3  --   --   --   --   --   --   PHOS   < >  --  4.2  --  4.5  --  3.8 3.0  --  3.0  --   --   --  2.1*   < > = values in this interval not displayed.   GFR: Estimated Creatinine Clearance: 39.9 mL/min (A) (by C-G formula based on SCr of 2.48 mg/dL (H)). Recent Labs  Lab 07/28/20 1116 07/28/20 1535 07/29/20 0358 07/30/20 0331 07/31/20 0337  PROCALCITON 4.06  --  7.50 5.06  --   WBC  --  6.4 5.4 7.5 8.5  LATICACIDVEN  --  1.0 0.9  --   --     Liver Function Tests: Recent Labs  Lab 07/19/2020 1933 07/26/20 1409 07/27/20 0304 07/30/20 0559 07/31/20 0337  AST 34 26 21  --   --   ALT '17 13 14  '$ --   --   ALKPHOS 33* 28* 38  --   --   BILITOT 2.4* 1.2 1.1  --   --   PROT 4.7* 4.9* 5.0*  --   --   ALBUMIN 3.6 3.4* 3.1* 2.5* 2.2*   No results for input(s): LIPASE, AMYLASE in the last 168 hours. No results for input(s): AMMONIA in the last 168 hours.  ABG    Component Value Date/Time   PHART 7.314 (L) 07/31/2020 0324   PCO2ART 48.4 (H) 07/31/2020 0324   PO2ART 69 (L) 07/31/2020 0324    HCO3 24.4 07/31/2020 0324   TCO2 26 07/31/2020 0324   ACIDBASEDEF 2.0 07/31/2020 0324   O2SAT 56.8 07/31/2020 0330     Coagulation Profile: Recent Labs  Lab 07/16/2020 1617 07/12/2020 1933  INR 1.4* 1.5*    Cardiac Enzymes: No results for input(s): CKTOTAL, CKMB, CKMBINDEX, TROPONINI in the last 168 hours.  HbA1C: Hgb A1c MFr Bld  Date/Time Value Ref Range Status  07/12/2020 06:28 PM 11.6 (H) 4.8 - 5.6 % Final    Comment:    (NOTE) Pre diabetes:          5.7%-6.4%  Diabetes:              >6.4%  Glycemic control for   <7.0% adults with diabetes   08/05/2019 03:09 AM 11.6 (H) 4.8 - 5.6 % Final    Comment:    (NOTE) Pre diabetes:          5.7%-6.4% Diabetes:              >6.4% Glycemic control for   <7.0% adults with  diabetes     CBG: Recent Labs  Lab 07/30/20 1601 07/30/20 1920 07/31/20 0020 07/31/20 0418 07/31/20 0730  GLUCAP 277* 334* 392* 334* 285*    This patient is critically ill with multiple organ system failure; which, requires frequent high complexity decision making, assessment, support, evaluation, and titration of therapies. This was completed through the application of advanced monitoring technologies and extensive interpretation of multiple databases. During this encounter critical care time was devoted to patient care services described in this note for 45 minutes.  Kipp Brood, MD [facility] 07/31/2020 8:36 AM

## 2020-07-31 NOTE — Plan of Care (Signed)
  Problem: Education: Goal: Knowledge of General Education information will improve Description: Including pain rating scale, medication(s)/side effects and non-pharmacologic comfort measures Outcome: Progressing   Problem: Health Behavior/Discharge Planning: Goal: Ability to manage health-related needs will improve Outcome: Progressing   Problem: Clinical Measurements: Goal: Ability to maintain clinical measurements within normal limits will improve Outcome: Progressing Goal: Will remain free from infection Outcome: Progressing Goal: Diagnostic test results will improve Outcome: Progressing Goal: Respiratory complications will improve Outcome: Progressing Goal: Cardiovascular complication will be avoided Outcome: Progressing   Problem: Activity: Goal: Risk for activity intolerance will decrease Outcome: Progressing   Problem: Nutrition: Goal: Adequate nutrition will be maintained Outcome: Progressing   Problem: Coping: Goal: Level of anxiety will decrease Outcome: Progressing   Problem: Elimination: Goal: Will not experience complications related to bowel motility Outcome: Progressing Goal: Will not experience complications related to urinary retention Outcome: Progressing   Problem: Pain Managment: Goal: General experience of comfort will improve Outcome: Progressing   Problem: Safety: Goal: Ability to remain free from injury will improve Outcome: Progressing   Problem: Skin Integrity: Goal: Risk for impaired skin integrity will decrease Outcome: Progressing   Problem: Education: Goal: Will demonstrate proper wound care and an understanding of methods to prevent future damage Outcome: Progressing Goal: Knowledge of disease or condition will improve Outcome: Progressing Goal: Knowledge of the prescribed therapeutic regimen will improve Outcome: Progressing Goal: Individualized Educational Video(s) Outcome: Progressing   Problem: Activity: Goal: Risk for  activity intolerance will decrease Outcome: Progressing   Problem: Cardiac: Goal: Will achieve and/or maintain hemodynamic stability Outcome: Progressing   Problem: Clinical Measurements: Goal: Postoperative complications will be avoided or minimized Outcome: Progressing   Problem: Respiratory: Goal: Respiratory status will improve Outcome: Progressing   Problem: Skin Integrity: Goal: Wound healing without signs and symptoms of infection Outcome: Progressing Goal: Risk for impaired skin integrity will decrease Outcome: Progressing   Problem: Urinary Elimination: Goal: Ability to achieve and maintain adequate renal perfusion and functioning will improve Outcome: Progressing   Problem: Activity: Goal: Ability to tolerate increased activity will improve Outcome: Progressing   Problem: Respiratory: Goal: Ability to maintain a clear airway and adequate ventilation will improve Outcome: Progressing   Problem: Role Relationship: Goal: Method of communication will improve Outcome: Progressing   

## 2020-07-31 NOTE — Progress Notes (Addendum)
TCTS DAILY ICU PROGRESS NOTE                   [address]            [place] [address]          [phone]   6 Days Post-Op Procedure(s) (LRB): EXPLORATION POST OPERATIVE OPEN HEART (N/A)  Total Length of Stay:  LOS: 11 days   Subjective: Sedated on vent  Objective: Vital signs in last 24 hours: Temp:  [99.32 F (37.4 C)-100.76 F (38.2 C)] 99.32 F (37.4 C) (05/02 0700) Pulse Rate:  [51-92] 63 (05/02 0700) Cardiac Rhythm: A-V Sequential paced (05/01 2000) Resp:  [10-27] 20 (05/02 0700) BP: (83-121)/(64-110) 110/71 (05/02 0000) SpO2:  [95 %-100 %] 99 % (05/02 0700) Arterial Line BP: (83-137)/(58-79) 112/69 (05/02 0700) FiO2 (%):  [40 %-60 %] 60 % (05/02 0352) Weight:  [127.4 kg] 127.4 kg (05/02 0400)  Filed Weights   07/28/20 0452 07/30/20 0045 07/31/20 0400  Weight: 123.6 kg 125.4 kg 127.4 kg    Weight change: 2 kg   Hemodynamic parameters for last 24 hours: PAP: (39-54)/(21-38) 41/24 CVP:  [15 mmHg-29 mmHg] 17 mmHg CO:  [4.8 L/min-6.8 L/min] 6.8 L/min CI:  [2.1 L/min/m2-3 L/min/m2] 3 L/min/m2   Vent Mode: PRVC FiO2 (%):  [40 %-60 %] 60 % Set Rate:  [20 bmp] 20 bmp Vt Set:  [630 mL] 630 mL PEEP:  [5 cmH20] 5 cmH20 Plateau Pressure:  [21 cmH20-33 cmH20] 25 cmH20 Intake/Output from previous day: 05/01 0701 - 05/02 0700 In: 2921.2 [I.V.:1523.1; NG/GT:815; IV Piggyback:306.1] Out: 4950 [Urine:4690; Chest Tube:260]  Intake/Output this shift: No intake/output data recorded.  Current Meds: Scheduled Meds: . sodium chloride   Intravenous Once  . sodium chloride   Intravenous Once  . acetaminophen  1,000 mg Oral Q6H   Or  . acetaminophen (TYLENOL) oral liquid 160 mg/5 mL  1,000 mg Per Tube Q6H  . amiodarone  200 mg Per Tube BID  . aspirin EC  325 mg Oral Daily   Or  . aspirin  324 mg Per Tube Daily  . bisacodyl  10 mg Oral Daily   Or  . bisacodyl  10 mg Rectal Daily  . chlorhexidine gluconate (MEDLINE KIT)  15 mL Mouth Rinse BID   . Chlorhexidine Gluconate Cloth  6 each Topical Daily  . docusate  200 mg Per Tube Daily  . feeding supplement (PROSource TF)  45 mL Per Tube QID  . insulin aspart  0-15 Units Subcutaneous Q4H  . mouth rinse  15 mL Mouth Rinse 10 times per day  . pantoprazole sodium  40 mg Per Tube Daily  . polyethylene glycol  17 g Per Tube Daily  . rosuvastatin  40 mg Per Tube Daily  . sodium chloride flush  3 mL Intravenous Q12H   Continuous Infusions: . sodium chloride Stopped (07/28/20 0851)  . ceFEPime (MAXIPIME) IV Stopped (07/30/20 2157)  . dexmedetomidine (PRECEDEX) IV infusion 1.2 mcg/kg/hr (07/31/20 0700)  . feeding supplement (VITAL 1.5 CAL) 55 mL/hr at 07/31/20 0700  . fentaNYL infusion INTRAVENOUS 100 mcg/hr (07/31/20 0700)  . furosemide (LASIX) 200 mg in dextrose 5% 100 mL (2mg/mL) infusion 10 mg/hr (07/31/20 0700)  . heparin 900 Units/hr (07/31/20 0700)  . impella catheter heparin 50 unit/mL in dextrose 5% 11.9 mL/hr at 07/31/20 0148  . lactated ringers    . milrinone 0.25 mcg/kg/min (07/31/20 0700)  . norepinephrine (LEVOPHED) Adult infusion 2.7 mcg/min (07/31/20 0700)  .   vasopressin 0.03 Units/min (07/31/20 0700)   PRN Meds:.sodium chloride, dextrose, fentaNYL, lactated ringers, metoprolol tartrate, ondansetron (ZOFRAN) IV, sodium chloride flush, traMADol  General appearance: non responsive to verbal stimuli but sedated Heart: regular rate and rhythm and paced Lungs: coarse Abdomen: + distension Extremities: + edema Wound: incis healing well  Lab Results: CBC: Recent Labs    07/30/20 0331 07/30/20 0339 07/31/20 0324 07/31/20 0337  WBC 7.5  --   --  8.5  HGB 9.3*   < > 9.2* 8.7*  HCT 29.5*   < > 27.0* 27.8*  PLT 112*  --   --  114*   < > = values in this interval not displayed.   BMET:  Recent Labs    07/30/20 1655 07/31/20 0131 07/31/20 0324 07/31/20 0337  NA 136   < > 138 136  K 4.0   < > 4.2 4.2  CL 103  --   --  103  CO2 24  --   --  24  GLUCOSE 312*   --   --  375*  BUN 51*  --   --  59*  CREATININE 2.25*  --   --  2.48*  CALCIUM 7.3*  --   --  7.4*   < > = values in this interval not displayed.    CMET: Lab Results  Component Value Date   WBC 8.5 07/31/2020   HGB 8.7 (L) 07/31/2020   HCT 27.8 (L) 07/31/2020   PLT 114 (L) 07/31/2020   GLUCOSE 375 (H) 07/31/2020   CHOL 159 07/03/2020   TRIG 134 07/31/2020   HDL 35 (L) 07/03/2020   LDLCALC 86 07/19/2020   ALT 14 07/27/2020   AST 21 07/27/2020   NA 136 07/31/2020   K 4.2 07/31/2020   CL 103 07/31/2020   CREATININE 2.48 (H) 07/31/2020   BUN 59 (H) 07/31/2020   CO2 24 07/31/2020   INR 1.5 (H) 07/19/2020   HGBA1C 11.6 (H) 07/18/2020      PT/INR: No results for input(s): LABPROT, INR in the last 72 hours. Radiology: No results found. Recent Results (from the past 240 hour(s))  Surgical pcr screen     Status: None   Collection Time: 07/24/20  7:58 PM   Specimen: Nasal Mucosa; Nasal Swab  Result Value Ref Range Status   MRSA, PCR NEGATIVE NEGATIVE Final   Staphylococcus aureus NEGATIVE NEGATIVE Final    Comment: (NOTE) The Xpert SA Assay (FDA approved for NASAL specimens in patients 22 years of age and older), is one component of a comprehensive surveillance program. It is not intended to diagnose infection nor to guide or monitor treatment. Performed at [facility], [address] [place], [place] [address]   Culture, blood (routine x 2)     Status: None (Preliminary result)   Collection Time: 07/27/20  8:18 AM   Specimen: BLOOD  Result Value Ref Range Status   Specimen Description BLOOD RIGHT ANTECUBITAL  Final   Special Requests   Final    BOTTLES DRAWN AEROBIC AND ANAEROBIC Blood Culture adequate volume   Culture   Final    NO GROWTH 3 DAYS Performed at [facility], [address] [place], [place] [address]    Report Status PENDING  Incomplete  Culture, blood (routine x 2)     Status: None (Preliminary result)   Collection Time: 07/27/20   8:25 AM   Specimen: BLOOD RIGHT HAND  Result Value Ref Range Status     Specimen Description BLOOD RIGHT HAND  Final   Special Requests   Final    BOTTLES DRAWN AEROBIC AND ANAEROBIC Blood Culture adequate volume   Culture   Final    NO GROWTH 3 DAYS Performed at [facility], [address], [place], [place] [address]    Report Status PENDING  Incomplete  Culture, Respiratory w Gram Stain     Status: None   Collection Time: 07/27/20  2:42 PM   Specimen: Tracheal Aspirate; Respiratory  Result Value Ref Range Status   Specimen Description TRACHEAL ASPIRATE  Final   Special Requests NONE  Final   Gram Stain   Final    RARE WBC PRESENT, PREDOMINANTLY PMN RARE GRAM NEGATIVE RODS    Culture   Final    RARE Normal respiratory flora-no Staph aureus or Pseudomonas seen Performed at [facility], [address], [place], [place] [address]    Report Status 07/30/2020 FINAL  Final    Assessment/Plan: S/P Procedure(s) (LRB): EXPLORATION POST OPERATIVE OPEN HEART (N/A)  1 Tmax100.7- on maxepime, concerns for VAP, no leukocytosis, no CX growth so far 2 gtts- precedex, fentanyl, lasix, heparin, milrinone, vasopressin, levophed Co-Ox 57, diurising pretty well, CVP 16-17, CI 2.9, Impella P7. Rate controlled afib on amio per tube- paced currently, underlying ? HB 3 nutrition:  TF's 4 full mech vent support- PRVC 5 Creat conts  To trend higher 2.48, may need CVVH  6 LDH conts to trend higher 392 7 ABLA- H/H down some  8 thrombocytopenia trend improved 9 ASD /Pulm edema 10 BS poorly controlled- may need to adjust SSI- poor preop control 11 PCCM/AHF teams managing Vent/HF/cardiology issues, may need nephrology                   John Giovanni PA-C Pager [phone] 07/31/2020 7:36 AM   Pt seen and examined; agree with documentation. Appreciate excellent consultant assistance Continue diuresis Would pace a faster rate Consider trach tomorrow Kaliegh Willadsen Z. Orvan Seen,  [place]

## 2020-07-31 NOTE — TOC Progression Note (Signed)
Transition of Care (TOC) - Progression Note  Heart Failure   Patient Details  Name: Danny Carpenter MRN: [id number] Date of Birth: [birth year]  Transition of Care [facility]) CM/SW [place], [place] Phone Number: 07/31/2020, 10:39 AM  Clinical Narrative:    CSW attempted to visit the patient at bedside to introduce self as the heart failure social worker and to complete a very brief SDOH screening with the patient to address social needs as needed however the patient was unresponsive and unable to engage in conversation at this time.   CSW will continue to follow for discharge needs and will check back with the patient at another time.      Barriers to Discharge: Continued Medical Work up  Expected Discharge Plan and Services   In-house Referral: Clinical Social Work                                             Social Determinants of Health (SDOH) Interventions    Readmission Risk Interventions Readmission Risk Prevention Plan 01/29/2019 01/20/2019  Post Dischage Appt - Complete  Medication Screening - Complete  Transportation Screening Complete Complete  PCP or Specialist Appt within 5-7 Days Complete -  Home Care Screening Complete -  Medication Review (RN CM) Complete -  Some recent data might be hidden   Zak Gondek, MSW, LCSWA [phone] Heart Failure Social Worker

## 2020-08-01 ENCOUNTER — Inpatient Hospital Stay (HOSPITAL_COMMUNITY): Payer: No Typology Code available for payment source

## 2020-08-01 DIAGNOSIS — I25700 Atherosclerosis of coronary artery bypass graft(s), unspecified, with unstable angina pectoris: Secondary | ICD-10-CM | POA: Diagnosis not present

## 2020-08-01 DIAGNOSIS — Z95811 Presence of heart assist device: Secondary | ICD-10-CM | POA: Diagnosis not present

## 2020-08-01 DIAGNOSIS — R570 Cardiogenic shock: Secondary | ICD-10-CM | POA: Diagnosis not present

## 2020-08-01 LAB — COOXEMETRY PANEL
Carboxyhemoglobin: 1.5 % (ref 0.5–1.5)
Carboxyhemoglobin: 1.4 % (ref 0.5–1.5)
Methemoglobin: 1.2 % (ref 0.0–1.5)
Methemoglobin: 1.2 % (ref 0.0–1.5)
O2 Saturation: 82 %
O2 Saturation: 74.9 %
Total hemoglobin: 6.5 g/dL — CL (ref 12.0–16.0)
Total hemoglobin: 9.4 g/dL — ABNORMAL LOW (ref 12.0–16.0)

## 2020-08-01 LAB — CBC
HCT: 27.7 % — ABNORMAL LOW (ref 39.0–52.0)
Hemoglobin: 8.4 g/dL — ABNORMAL LOW (ref 13.0–17.0)
MCH: 28.1 pg (ref 26.0–34.0)
MCHC: 30.3 g/dL (ref 30.0–36.0)
MCV: 92.6 fL (ref 80.0–100.0)
Platelets: 128 10*3/uL — ABNORMAL LOW (ref 150–400)
RBC: 2.99 MIL/uL — ABNORMAL LOW (ref 4.22–5.81)
RDW: 17.7 % — ABNORMAL HIGH (ref 11.5–15.5)
WBC: 7.4 10*3/uL (ref 4.0–10.5)
nRBC: 1.6 % — ABNORMAL HIGH (ref 0.0–0.2)

## 2020-08-01 LAB — GLUCOSE, CAPILLARY
Glucose-Capillary: 118 mg/dL — ABNORMAL HIGH (ref 70–99)
Glucose-Capillary: 124 mg/dL — ABNORMAL HIGH (ref 70–99)
Glucose-Capillary: 130 mg/dL — ABNORMAL HIGH (ref 70–99)
Glucose-Capillary: 135 mg/dL — ABNORMAL HIGH (ref 70–99)
Glucose-Capillary: 140 mg/dL — ABNORMAL HIGH (ref 70–99)
Glucose-Capillary: 145 mg/dL — ABNORMAL HIGH (ref 70–99)
Glucose-Capillary: 164 mg/dL — ABNORMAL HIGH (ref 70–99)
Glucose-Capillary: 167 mg/dL — ABNORMAL HIGH (ref 70–99)
Glucose-Capillary: 199 mg/dL — ABNORMAL HIGH (ref 70–99)
Glucose-Capillary: 233 mg/dL — ABNORMAL HIGH (ref 70–99)
Glucose-Capillary: 239 mg/dL — ABNORMAL HIGH (ref 70–99)
Glucose-Capillary: 290 mg/dL — ABNORMAL HIGH (ref 70–99)
Glucose-Capillary: 306 mg/dL — ABNORMAL HIGH (ref 70–99)
Glucose-Capillary: 310 mg/dL — ABNORMAL HIGH (ref 70–99)
Glucose-Capillary: 353 mg/dL — ABNORMAL HIGH (ref 70–99)
Glucose-Capillary: 388 mg/dL — ABNORMAL HIGH (ref 70–99)
Glucose-Capillary: 400 mg/dL — ABNORMAL HIGH (ref 70–99)

## 2020-08-01 LAB — BASIC METABOLIC PANEL
Anion gap: 10 (ref 5–15)
Anion gap: 5 (ref 5–15)
BUN: 70 mg/dL — ABNORMAL HIGH (ref 8–23)
BUN: 77 mg/dL — ABNORMAL HIGH (ref 8–23)
CO2: 24 mmol/L (ref 22–32)
CO2: 25 mmol/L (ref 22–32)
Calcium: 7.4 mg/dL — ABNORMAL LOW (ref 8.9–10.3)
Calcium: 7.2 mg/dL — ABNORMAL LOW (ref 8.9–10.3)
Chloride: 103 mmol/L (ref 98–111)
Chloride: 107 mmol/L (ref 98–111)
Creatinine, Ser: 2.89 mg/dL — ABNORMAL HIGH (ref 0.61–1.24)
Creatinine, Ser: 3.69 mg/dL — ABNORMAL HIGH (ref 0.61–1.24)
GFR, Estimated: 23 mL/min — ABNORMAL LOW (ref >60)
GFR, Estimated: 17 mL/min — ABNORMAL LOW (ref >60)
Glucose, Bld: 337 mg/dL — ABNORMAL HIGH (ref 70–99)
Glucose, Bld: 125 mg/dL — ABNORMAL HIGH (ref 70–99)
Potassium: 4 mmol/L (ref 3.5–5.1)
Potassium: 4.4 mmol/L (ref 3.5–5.1)
Sodium: 137 mmol/L (ref 135–145)
Sodium: 137 mmol/L (ref 135–145)

## 2020-08-01 LAB — CULTURE, BLOOD (ROUTINE X 2)
Culture: NO GROWTH
Culture: NO GROWTH
Special Requests: ADEQUATE
Special Requests: ADEQUATE

## 2020-08-01 LAB — LACTIC ACID, PLASMA: Lactic Acid, Venous: 0.8 mmol/L (ref 0.5–1.9)

## 2020-08-01 LAB — HEPARIN LEVEL (UNFRACTIONATED): Heparin Unfractionated: 0.16 IU/mL — ABNORMAL LOW (ref 0.30–0.70)

## 2020-08-01 LAB — LACTATE DEHYDROGENASE: LDH: 350 U/L — ABNORMAL HIGH (ref 98–192)

## 2020-08-01 MED ORDER — INSULIN ASPART 100 UNIT/ML IJ SOLN
10.0000 [IU] | INTRAMUSCULAR | Status: DC
  Administered 2020-08-01 – 2020-08-10 (×43): 10 [IU] via SUBCUTANEOUS

## 2020-08-01 MED ORDER — PRISMASOL BGK 4/2.5 32-4-2.5 MEQ/L REPLACEMENT SOLN: Status: DC

## 2020-08-01 MED ORDER — PRISMASOL BGK 4/2.5 32-4-2.5 MEQ/L EC SOLN: Status: DC

## 2020-08-01 MED ORDER — DEXTROSE 50 % IV SOLN: 0.0000 mL | INTRAVENOUS | Status: DC | PRN

## 2020-08-01 MED ORDER — METOLAZONE 5 MG PO TABS
5.0000 mg | ORAL_TABLET | Freq: Once | ORAL | Status: AC
  Administered 2020-08-01: 5 mg
  Filled 2020-08-01: qty 1

## 2020-08-01 MED ORDER — INSULIN ASPART 100 UNIT/ML IJ SOLN
3.0000 [IU] | INTRAMUSCULAR | Status: DC
  Administered 2020-08-01 (×2): 3 [IU] via SUBCUTANEOUS
  Administered 2020-08-01 – 2020-08-02 (×2): 6 [IU] via SUBCUTANEOUS
  Administered 2020-08-02 (×2): 3 [IU] via SUBCUTANEOUS
  Administered 2020-08-02: 9 [IU] via SUBCUTANEOUS
  Administered 2020-08-02 (×2): 6 [IU] via SUBCUTANEOUS
  Administered 2020-08-03 (×2): 3 [IU] via SUBCUTANEOUS
  Administered 2020-08-03 – 2020-08-04 (×4): 6 [IU] via SUBCUTANEOUS
  Administered 2020-08-04: 3 [IU] via SUBCUTANEOUS
  Administered 2020-08-05 (×2): 9 [IU] via SUBCUTANEOUS
  Administered 2020-08-05 (×2): 6 [IU] via SUBCUTANEOUS
  Administered 2020-08-05: 3 [IU] via SUBCUTANEOUS
  Administered 2020-08-06: 9 [IU] via SUBCUTANEOUS
  Administered 2020-08-06: 6 [IU] via SUBCUTANEOUS
  Administered 2020-08-06 (×2): 3 [IU] via SUBCUTANEOUS
  Administered 2020-08-06: 6 [IU] via SUBCUTANEOUS
  Administered 2020-08-06 – 2020-08-07 (×3): 9 [IU] via SUBCUTANEOUS
  Administered 2020-08-07: 3 [IU] via SUBCUTANEOUS
  Administered 2020-08-07 – 2020-08-08 (×7): 6 [IU] via SUBCUTANEOUS
  Administered 2020-08-09: 9 [IU] via SUBCUTANEOUS
  Administered 2020-08-09: 6 [IU] via SUBCUTANEOUS
  Administered 2020-08-09: 9 [IU] via SUBCUTANEOUS
  Administered 2020-08-09: 6 [IU] via SUBCUTANEOUS
  Administered 2020-08-09: 9 [IU] via SUBCUTANEOUS
  Administered 2020-08-09 (×2): 6 [IU] via SUBCUTANEOUS
  Administered 2020-08-10: 3 [IU] via SUBCUTANEOUS
  Administered 2020-08-10 – 2020-08-11 (×4): 6 [IU] via SUBCUTANEOUS
  Administered 2020-08-11: 3 [IU] via SUBCUTANEOUS
  Administered 2020-08-11 – 2020-08-12 (×3): 6 [IU] via SUBCUTANEOUS
  Administered 2020-08-12: 3 [IU] via SUBCUTANEOUS
  Administered 2020-08-12 – 2020-08-15 (×8): 6 [IU] via SUBCUTANEOUS
  Administered 2020-08-15: 3 [IU] via SUBCUTANEOUS
  Administered 2020-08-15: 6 [IU] via SUBCUTANEOUS
  Administered 2020-08-15: 9 [IU] via SUBCUTANEOUS
  Administered 2020-08-15: 3 [IU] via SUBCUTANEOUS
  Administered 2020-08-15: 6 [IU] via SUBCUTANEOUS
  Administered 2020-08-15: 9 [IU] via SUBCUTANEOUS
  Administered 2020-08-16: 6 [IU] via SUBCUTANEOUS
  Administered 2020-08-16: 3 [IU] via SUBCUTANEOUS
  Administered 2020-08-17: 6 [IU] via SUBCUTANEOUS
  Administered 2020-08-17: 3 [IU] via SUBCUTANEOUS
  Administered 2020-08-17: 6 [IU] via SUBCUTANEOUS
  Administered 2020-08-17: 9 [IU] via SUBCUTANEOUS
  Administered 2020-08-17: 6 [IU] via SUBCUTANEOUS
  Administered 2020-08-18: 3 [IU] via SUBCUTANEOUS
  Administered 2020-08-18 (×2): 9 [IU] via SUBCUTANEOUS

## 2020-08-01 MED ORDER — SODIUM CHLORIDE 0.9 % FOR CRRT: INTRAVENOUS_CENTRAL | Status: DC | PRN

## 2020-08-01 MED ORDER — INSULIN REGULAR(HUMAN) IN NACL 100-0.9 UT/100ML-% IV SOLN
INTRAVENOUS | Status: DC
  Administered 2020-08-01: 22 [IU]/h via INTRAVENOUS
  Administered 2020-08-01: 11.5 [IU]/h via INTRAVENOUS
  Filled 2020-08-01 (×3): qty 100

## 2020-08-01 MED ORDER — SODIUM CHLORIDE 0.9% FLUSH
10.0000 mL | Freq: Two times a day (BID) | INTRAVENOUS | Status: DC
  Administered 2020-08-01 – 2020-08-02 (×2): 10 mL
  Administered 2020-08-02: 20 mL
  Administered 2020-08-03 – 2020-08-11 (×15): 10 mL
  Administered 2020-08-11: 20 mL
  Administered 2020-08-12 – 2020-08-14 (×4): 10 mL
  Administered 2020-08-14: 20 mL
  Administered 2020-08-15 – 2020-08-24 (×13): 10 mL

## 2020-08-01 MED ORDER — INSULIN DETEMIR 100 UNIT/ML ~~LOC~~ SOLN
29.0000 [IU] | Freq: Two times a day (BID) | SUBCUTANEOUS | Status: DC
  Administered 2020-08-01 – 2020-08-10 (×19): 29 [IU] via SUBCUTANEOUS
  Filled 2020-08-01 (×20): qty 0.29

## 2020-08-01 MED ORDER — FENTANYL CITRATE (PF) 100 MCG/2ML IJ SOLN: 100.0000 ug | Freq: Once | INTRAMUSCULAR | Status: AC

## 2020-08-01 MED ORDER — SODIUM CHLORIDE 0.9% FLUSH
10.0000 mL | INTRAVENOUS | Status: DC | PRN
  Administered 2020-08-07: 40 mL

## 2020-08-01 MED ORDER — ACETAMINOPHEN 325 MG PO TABS
650.0000 mg | ORAL_TABLET | Freq: Four times a day (QID) | ORAL | Status: DC | PRN
  Administered 2020-08-07: 650 mg via ORAL
  Filled 2020-08-01: qty 2

## 2020-08-01 MED ORDER — FENTANYL CITRATE (PF) 100 MCG/2ML IJ SOLN
INTRAMUSCULAR | Status: AC
  Administered 2020-08-01: 100 ug via INTRAVENOUS
  Filled 2020-08-01: qty 2

## 2020-08-01 MED ORDER — METOLAZONE 2.5 MG PO TABS: 2.5000 mg | ORAL_TABLET | Freq: Once | ORAL | Status: DC

## 2020-08-01 MED ORDER — DEXMEDETOMIDINE HCL IN NACL 400 MCG/100ML IV SOLN
0.0000 ug/kg/h | INTRAVENOUS | Status: DC
  Administered 2020-08-02: 0.8 ug/kg/h via INTRAVENOUS
  Administered 2020-08-02: 0.7 ug/kg/h via INTRAVENOUS
  Administered 2020-08-02: 0.6 ug/kg/h via INTRAVENOUS
  Administered 2020-08-02 – 2020-08-03 (×2): 0.8 ug/kg/h via INTRAVENOUS
  Administered 2020-08-03: 0.7 ug/kg/h via INTRAVENOUS
  Administered 2020-08-03 (×3): 0.8 ug/kg/h via INTRAVENOUS
  Administered 2020-08-03: 0.7 ug/kg/h via INTRAVENOUS
  Administered 2020-08-04: 0.8 ug/kg/h via INTRAVENOUS
  Administered 2020-08-04: 1 ug/kg/h via INTRAVENOUS
  Administered 2020-08-04: 0.8 ug/kg/h via INTRAVENOUS
  Administered 2020-08-04: 0.9 ug/kg/h via INTRAVENOUS
  Administered 2020-08-04 – 2020-08-05 (×2): 1 ug/kg/h via INTRAVENOUS
  Administered 2020-08-05: 0.8 ug/kg/h via INTRAVENOUS
  Administered 2020-08-05 (×4): 1 ug/kg/h via INTRAVENOUS
  Administered 2020-08-06: 0.4 ug/kg/h via INTRAVENOUS
  Administered 2020-08-06: 0.7 ug/kg/h via INTRAVENOUS
  Administered 2020-08-06: 0.4 ug/kg/h via INTRAVENOUS
  Administered 2020-08-06: 1 ug/kg/h via INTRAVENOUS
  Administered 2020-08-07 – 2020-08-09 (×5): 0.3 ug/kg/h via INTRAVENOUS
  Filled 2020-08-01 (×12): qty 100
  Filled 2020-08-01: qty 200
  Filled 2020-08-01: qty 300
  Filled 2020-08-01: qty 200
  Filled 2020-08-01 (×2): qty 100
  Filled 2020-08-01 (×2): qty 200
  Filled 2020-08-01 (×3): qty 100

## 2020-08-01 MED ORDER — HEPARIN SODIUM (PORCINE) 1000 UNIT/ML DIALYSIS
1000.0000 [IU] | INTRAMUSCULAR | Status: DC | PRN
  Administered 2020-08-03 – 2020-08-04 (×2): 3000 [IU] via INTRAVENOUS_CENTRAL
  Administered 2020-08-05: 3200 [IU] via INTRAVENOUS_CENTRAL
  Administered 2020-08-08: 3000 [IU] via INTRAVENOUS_CENTRAL
  Administered 2020-08-11: 2400 [IU] via INTRAVENOUS_CENTRAL
  Administered 2020-08-11: 2800 [IU] via INTRAVENOUS_CENTRAL
  Filled 2020-08-01: qty 6
  Filled 2020-08-01: qty 4
  Filled 2020-08-01: qty 6
  Filled 2020-08-01: qty 4
  Filled 2020-08-01 (×8): qty 6

## 2020-08-01 MED ORDER — DEXTROSE 10 % IV SOLN: INTRAVENOUS | Status: DC | PRN

## 2020-08-01 NOTE — Progress Notes (Signed)
ANTICOAGULATION CONSULT NOTE - Follow Up Consult  Pharmacy Consult for heparin Indication: Impella  Labs: Recent Labs    07/30/20 0331 07/30/20 0339 07/30/20 1655 07/31/20 0131 07/31/20 0324 07/31/20 0337 07/31/20 0338 07/31/20 1253 07/31/20 1640 08/01/20 0253  HGB 9.3*   < >  --    < > 9.2* 8.7*  --   --   --  8.4*  HCT 29.5*   < >  --    < > 27.0* 27.8*  --   --   --  27.7*  PLT 112*  --   --   --   --  114*  --   --   --  128*  HEPARINUNFRC <0.10*  --  <0.10*  --   --   --  0.10* 0.12*  --  0.16*  CREATININE  --    < > 2.25*  --   --  2.48*  --   --  2.61*  --    < > = values in this interval not displayed.    Assessment: 70yo male remains subtherapeutic on heparin after rate changes though slowly approaching goal; no gtt issues or signs of bleeding per RN.  Goal of Therapy:  aPTT 0.2-0.3 seconds   Plan:  Will increase systemic heparin gtt conservatively to 1100 units/hr (for total ~1700 units/hr with heparin purge solution) and check level in 8 hours.    Wynona Neat, PharmD, BCPS  08/01/2020,4:03 AM

## 2020-08-01 NOTE — Procedures (Signed)
Critical care central line insertion note  Indication: CVP monitoring   Consent obtained: yes  Preprocedural timeout called: yes  Full aseptic bundle used: yes  Ultrasound guidance: yes  Line inserted: 20cm 49F TLC  Insertion depth: 18cm  Vessel used: left subclavian  Number of attempts: 1  Line placement confirmation: non- pulsatile flow on manometry  X-ray performed: yes - pending.   Danny Brood, MD [facility] ICU Physician [place]  Pager: [phone] Mobile: [phone] After hours: [phone].  05/25/2018, 6:28 PM

## 2020-08-01 NOTE — Progress Notes (Addendum)
Patient ID: Danny Carpenter, male   DOB: 01/28/1951, 70 y.o.   MRN: [id number]     Advanced Heart Failure Rounding Note  PCP-Cardiologist: None   Subjective:    4/26 S/P CABG  Remains intubated/sedated.   Now off VP. Remains on NE 8 + milrinone 0.25 + Lasix gtt at 10/hr.   Co-ox 82% ?Marland Kitchen Doubt accurate. Repeat pending.   Scr continues to rise, 2.07>>2.25>>2.48>>2.61>>2.89. UOP slowing, only 2.3L out yesterday on lasix gtt (4.7 L the day prior).  BUN 70. K 4.0.  Remains markedly fluid overloaded. CVP 17. Wt climbing. ~40 lb above pre op wt.   Febrile overnight. mTemp 101.1 WBC normal at 7.4. remains on cefepime.   Swan numbers: CVP 17 PA 40/25 (32) CI 3.28   Impella P8 Flow 4.2 No alarms Waveforms ok  LDH 209 => 247 => 215 => 247=>329=>350    Objective:   Weight Range: 129.5 kg Body mass index is 37.67 kg/m.   Vital Signs:   Temp:  [99.1 F (37.3 C)-101.1 F (38.4 C)] 101.1 F (38.4 C) (05/03 0645) Pulse Rate:  [62-96] 95 (05/03 0645) Resp:  [9-24] 19 (05/03 0645) BP: (94-130)/(64-81) 98/66 (05/03 0400) SpO2:  [96 %-100 %] 99 % (05/03 0645) Arterial Line BP: (81-138)/(52-74) 101/54 (05/03 0645) FiO2 (%):  [40 %-60 %] 40 % (05/03 0414) Weight:  [129.5 kg] 129.5 kg (05/03 0500) Last BM Date: 07/31/20  Weight change: Filed Weights   07/30/20 0045 07/31/20 0400 08/01/20 0500  Weight: 125.4 kg 127.4 kg 129.5 kg    Intake/Output:   Intake/Output Summary (Last 24 hours) at 08/01/2020 0710 Last data filed at 08/01/2020 0700 Gross per 24 hour  Intake 4136.22 ml  Output 2480 ml  Net 1656.22 ml      Physical Exam   CVP 17 General:  Intubated and sedated   HEENT: normal + ETT  Neck: supple.  JVP not well visualized. Carotids 2+ bilat; no bruits. No lymphadenopathy or thryomegaly appreciated. Cor: PMI nondisplaced. RRR. No MRG. Sternal site stable. + CTs  Lungs: intubated and clear  Abdomen: obese soft, nontender, nondistended. No hepatosplenomegaly. No  bruits or masses. Good bowel sounds. Extremities: no cyanosis, clubbing, rash, 2 edema up to thighs, s/p left transmetatarsal amputation  Neuro: intubated and sedated    Telemetry   A-V Paced 90s Personally reviewed   Labs    CBC Recent Labs    07/31/20 0337 08/01/20 0253  WBC 8.5 7.4  HGB 8.7* 8.4*  HCT 27.8* 27.7*  MCV 90.3 92.6  PLT 114* 937*   Basic Metabolic Panel Recent Labs    07/29/20 1522 07/30/20 0331 07/30/20 0339 07/30/20 0559 07/30/20 1655 07/31/20 0337 07/31/20 1640 08/01/20 0253  NA 137  --    < > 136   < > 136 134* 137  K 4.0  --    < > 3.4*   < > 4.2 4.2 4.0  CL 104  --   --  102   < > 103 102 103  CO2 22  --   --  24   < > 24 24 24   GLUCOSE 328*  --   --  333*   < > 375* 394* 337*  BUN 41*  --   --  46*   < > 59* 67* 70*  CREATININE 1.95*  --   --  2.07*   < > 2.48* 2.61* 2.89*  CALCIUM 7.2*  --   --  7.4*   < > 7.4* 7.1*  7.4*  MG 2.4 2.3  --   --   --   --   --   --   PHOS 3.8 3.0  --  3.0  --  2.1*  --   --    < > = values in this interval not displayed.   Liver Function Tests Recent Labs    07/30/20 0559 07/31/20 0337  ALBUMIN 2.5* 2.2*   No results for input(s): LIPASE, AMYLASE in the last 72 hours. Cardiac Enzymes No results for input(s): CKTOTAL, CKMB, CKMBINDEX, TROPONINI in the last 72 hours.  BNP: BNP (last 3 results) Recent Labs    07/19/2020 1211  BNP 465.5*    ProBNP (last 3 results) No results for input(s): PROBNP in the last 8760 hours.   D-Dimer No results for input(s): DDIMER in the last 72 hours. Hemoglobin A1C No results for input(s): HGBA1C in the last 72 hours. Fasting Lipid Panel Recent Labs    07/31/20 0338  TRIG 134   Thyroid Function Tests No results for input(s): TSH, T4TOTAL, T3FREE, THYROIDAB in the last 72 hours.  Invalid input(s): FREET3  Other results:   Imaging    DG CHEST PORT 1 VIEW  Result Date: 08/01/2020 CLINICAL DATA:  Intubation. EXAM: PORTABLE CHEST 1 VIEW COMPARISON:   07/31/2020. FINDINGS: Feeding tube tip is not visualized visualized. KUB can be obtained to ensure that the tip is below the left hemidiaphragm. Endotracheal tube, mediastinal drainage tubes, bilateral chest tubes in stable position. Impella device in stable position. Prior median sternotomy. Stable cardiomegaly. Persistent bilateral pulmonary infiltrates/edema, right side greater than left. No interim change. No pleural effusion or pneumothorax. Old left rib fractures again noted. IMPRESSION: 1. Feeding tube tip is not visualized. KUB can be obtained to ensure the tip is below left hemidiaphragm. Remaining lines and tubes including bilateral chest tubes in stable position. No pneumothorax. 2. Impella device in stable position. Prior median sternotomy. Stable cardiomegaly. 3. Persistent bilateral pulmonary infiltrates/edema right side greater than left. No interim change. Electronically Signed   By: Danny Moores  Carpenter   On: 08/01/2020 06:58   ECHOCARDIOGRAM LIMITED  Result Date: 07/31/2020    ECHOCARDIOGRAM LIMITED REPORT   Patient Name:   Danny Carpenter Date of Exam: 07/31/2020 Medical Rec #:  [id number]     Height:       73.0 in Accession #:    [id number]    Weight:       280.9 lb Date of Birth:  November 17, 1950    BSA:          2.486 m Patient Age:    89 years      BP:           113/81 mmHg Patient Gender: M             HR:           89 bpm. Exam Location:  Inpatient Procedure: Limited Echo Indications:    O84.16 Acute systolic (congestive) heart failure  History:        Patient has prior history of Echocardiogram examinations, most                 recent 07/29/2020. Prior CABG; Risk Factors:Hypertension,                 Diabetes and Dyslipidemia.  Sonographer:    Danny Carpenter Referring Phys: Danny Carpenter Comments: This was a limited echo for Impella position. The Impella device was not moved. IMPRESSIONS  1. Left ventricular ejection fraction, by estimation, is <20%. The left ventricle has  severely decreased function with dyskinetic septum. The left ventricular internal cavity size was mildly dilated. Impella 5.5 catheter present with tip at 5.3 cm from the aortic valve.  2. Right ventricular systolic function is moderately reduced. The right ventricular size is normal. FINDINGS  Left Ventricle: Left ventricular ejection fraction, by estimation, is <20%. The left ventricle has severely decreased function. The left ventricular internal cavity size was mildly dilated. There is no left ventricular hypertrophy. Right Ventricle: The right ventricular size is normal. Right ventricular systolic function is moderately reduced. Loralie Champagne MD Electronically signed by Loralie Champagne MD Signature Date/Time: 07/31/2020/1:52:44 PM    Final      Medications:     Scheduled Medications: . sodium chloride   Intravenous Once  . sodium chloride   Intravenous Once  . amiodarone  200 mg Per Tube BID  . aspirin EC  325 mg Oral Daily   Or  . aspirin  324 mg Per Tube Daily  . bisacodyl  10 mg Oral Daily   Or  . bisacodyl  10 mg Rectal Daily  . chlorhexidine gluconate (MEDLINE KIT)  15 mL Mouth Rinse BID  . Chlorhexidine Gluconate Cloth  6 each Topical Daily  . docusate  200 mg Per Tube Daily  . feeding supplement (PROSource TF)  45 mL Per Tube QID  . mouth rinse  15 mL Mouth Rinse 10 times per day  . pantoprazole sodium  40 mg Per Tube Daily  . polyethylene glycol  17 g Per Tube Daily  . rosuvastatin  40 mg Per Tube Daily  . sodium chloride flush  3 mL Intravenous Q12H    Infusions: . sodium chloride 10 mL/hr at 08/01/20 0700  . ceFEPime (MAXIPIME) IV Stopped (07/31/20 2153)  . dexmedetomidine (PRECEDEX) IV infusion 0.8 mcg/kg/hr (08/01/20 0700)  . feeding supplement (VITAL 1.5 CAL) 1,000 mL (08/01/20 0425)  . fentaNYL infusion INTRAVENOUS 150 mcg/hr (08/01/20 0700)  . furosemide (LASIX) 200 mg in dextrose 5% 100 mL (13m/mL) infusion 10 mg/hr (08/01/20 0700)  . heparin 1,100 Units/hr  (08/01/20 0700)  . impella catheter heparin 50 unit/mL in dextrose 5% 11.9 mL/hr at 07/31/20 0148  . insulin 19 mL/hr at 08/01/20 0700  . lactated ringers    . milrinone 0.25 mcg/kg/min (08/01/20 0700)  . norepinephrine (LEVOPHED) Adult infusion 8 mcg/min (08/01/20 0700)  . vasopressin Stopped (07/31/20 1606)    PRN Medications: sodium chloride, dextrose, dextrose, fentaNYL, lactated ringers, metoprolol tartrate, ondansetron (ZOFRAN) IV, sodium chloride flush, traMADol  Assessment/Plan   1. Cardiogenic shock: Ischemic cardiomyopathy, post-CABG on 4/26.  He has Impella 5.5 in place, at P7 with adequate flow and no alarms.  Limited echo 4/29 with EF< 20%, the RV appears normal in size with severe systolic dysfunction. Hemodynamically improved with VP pacing and current support. CI 3.28 by thermodilution. Co-ox pending. Remains on NE 8 + Milrinone 0.25. Off VP.  Remains markedly fluid overloaded w/ steadily rising SCr/BUN and decreased UOP. Impella position stable by echo yesterday.  - He may end up on CVVH for UF - Maintain MAP, continue NE. Keep MAP >= 70 - Continue current milrinone.  - Continue Impella at P8. Has heparin in purge and on low dose systemic heparin given post-op bleeding.  Wean vasoactive meds before Impella.   - Off digoxin with creatinine rise.  2. CAD: s/p CABG x 5 with LIMA-LAD, seq SVG-D1/ramus, seq SVG-PDA/PLV.   - ASA -  Crestor - no change 3. Anemia: Post-op bleeding, back to OR with multiple products given on 4/26 post-CABG.  Hgb stable down slightly today 9.2>>8.7>8.4 4. Thrombocytopenia: Plts 90K -> 112->114->128K  today, suspect low post-op/post-surgical bleeding and multiple blood products as well as sepsis.  - Follow.  5. PAD: Extensive history.  6. AAA: Monitoring as outpatient.  7. Type 2 DM: Insulin.  Hgb A1c was 11.6, poor control.  8. Atrial fibrillation: Rate-controlled.  Continue per tube amiodarone for now.   9. Neuro: intubated and sedated  10. ID:  Febrile overnight, mTemp 101.1 with normal WBCs.  Cultures NGTD.  PCT 7.5 on 4/29.  Currently on cefepime.  - Continue cefepime 11. Acute hypoxemic respiratory failure: Pulmonary edema, possible PNA.  On vent, unable to wean just yet with marked volume overload.  May need trach if cannot extubate soon.  - Per CCM.  12. AKI: Creatinine up 1.8 -> 2.07->2.3->2.5->2.6->2.9 likely with ATN from hypotension.  He is volume overloaded. Continue lasix gtt. Keep MAP >= 70. If this is unsuccessful and creatinine continues to rise, may need CVVH.    Lyda Jester, PA-C  08/01/2020 7:10 AM  Patient seen with PA, agree with the above note.   This morning, he is on milrinone 0.25 + NE 8 and with Impella at P8 with no alarms.  Co-ox 82% with CI 3.28, cardiac output has been excellent.  Still febrile to 101.3 and on cefepime.  SVR low at 525.  MAP 65. Creatinine up to 2.89, O>I, weight up.  CVP >20 on my read.   General: Intubated/sedated.  Neck: Thick, JVP 16+, no thyromegaly or thyroid nodule.  Lungs: Clear to auscultation bilaterally with normal respiratory effort. CV: Nondisplaced PMI.  Heart regular S1/S2, no S3/S4, no murmur.  2+ edema lower legs and forearms.   Abdomen: Soft, nontender, no hepatosplenomegaly, no distention.  Skin: Intact without lesions or rashes.  Neurologic:Will grimace with pain. Extremities: No clubbing or cyanosis.  HEENT: Normal.   Suspect combined septic/vasodilatory and cardiogenic shock. Good cardiac output today with low SVR.  MAP 65.  Creatinine rising with poor UOP.  - Stop milrinone.  - Continue NE, titrate to maintain MAP > 65.  - Continue Impella support at P8, good position by echo yesterday and LDH relatively stable.  He is on heparin gtt.  - Worsening renal function in setting of suspected ATN => maintain MAP and cardiac output.  With marked volume overload and weight up, continue to try to push diuresis => increase Lasix to 15 mg/hr and add metolazone 2.5 with  repeat BMET in pm.  If renal function continues to worsen, will need CVVH.   Ongoing fevers, discussed with CCM.  Recommend continuing cefepime for now but will remove Swan and do fresh stick to place CVL to follow CVP and co-ox.   CRITICAL CARE Performed by: Loralie Champagne  Total critical care time: 40 minutes  Critical care time was exclusive of separately billable procedures and treating other patients.  Critical care was necessary to treat or prevent imminent or life-threatening deterioration.  Critical care was time spent personally by me on the following activities: development of treatment plan with patient and/or surrogate as well as nursing, discussions with consultants, evaluation of patient's response to treatment, examination of patient, obtaining history from patient or surrogate, ordering and performing treatments and interventions, ordering and review of laboratory studies, ordering and review of radiographic studies, pulse oximetry and re-evaluation of patient's condition.  Loralie Champagne 08/01/2020 7:59 AM

## 2020-08-01 NOTE — Progress Notes (Signed)
7 Days Post-Op Procedure(s) (LRB): EXPLORATION POST OPERATIVE OPEN HEART (N/A) Subjective: sedated  Objective: Vital signs in last 24 hours: Temp:  [99.1 F (37.3 C)-101.7 F (38.7 C)] 101.7 F (38.7 C) (05/03 1200) Pulse Rate:  [75-95] 90 (05/03 1136) Cardiac Rhythm: A-V Sequential paced (05/03 1200) Resp:  [9-24] 9 (05/03 0924) BP: (94-142)/(61-83) 94/83 (05/03 1136) SpO2:  [97 %-100 %] 99 % (05/03 0645) Arterial Line BP: (81-137)/(52-65) 101/54 (05/03 0645) FiO2 (%):  [40 %] 40 % (05/03 1200) Weight:  [129.5 kg] 129.5 kg (05/03 0500)  Hemodynamic parameters for last 24 hours: PAP: (37-49)/(22-33) 39/26 CVP:  [14 mmHg-23 mmHg] 18 mmHg PCWP:  [33 mmHg] 33 mmHg CO:  [7 L/min-8.3 L/min] 8.3 L/min CI:  [3 L/min/m2-3.6 L/min/m2] 3.6 L/min/m2  Intake/Output from previous day: 05/02 0701 - 05/03 0700 In: 4136.2 [I.V.:1882.6; NG/GT:1565; IV Piggyback:405.9] Out: 2530 [Urine:2310; Stool:100; Chest Tube:120] Intake/Output this shift: Total I/O In: 951.1 [I.V.:308.6; Other:97.1; NG/GT:415; IV Piggyback:130.4] Out: 155 [Urine:115; Chest Tube:40]  General appearance: sedated Neurologic: unable to fully assess Heart: regular rate and rhythm, S1, S2 normal, no murmur, click, rub or gallop Lungs: clear to auscultation bilaterally Abdomen: mildly distended Extremities: edema 2+ Wound: c/d/i  Lab Results: Recent Labs    07/31/20 0337 08/01/20 0253  WBC 8.5 7.4  HGB 8.7* 8.4*  HCT 27.8* 27.7*  PLT 114* 128*   BMET:  Recent Labs    07/31/20 1640 08/01/20 0253  NA 134* 137  K 4.2 4.0  CL 102 103  CO2 24 24  GLUCOSE 394* 337*  BUN 67* 70*  CREATININE 2.61* 2.89*  CALCIUM 7.1* 7.4*    PT/INR: No results for input(s): LABPROT, INR in the last 72 hours. ABG    Component Value Date/Time   PHART 7.314 (L) 07/31/2020 0324   HCO3 24.4 07/31/2020 0324   TCO2 26 07/31/2020 0324   ACIDBASEDEF 2.0 07/31/2020 0324   O2SAT 74.9 08/01/2020 0831   CBG (last 3)  Recent Labs     08/01/20 1128 08/01/20 1225 08/01/20 1333  GLUCAP 135* 118* 130*    Assessment/Plan: S/P Procedure(s) (LRB): EXPLORATION POST OPERATIVE OPEN HEART (N/A) Diuresis continue to support hemodynamically  Resolving PNA, continue abx Likely HD   LOS: 12 days    Danny Carpenter 08/01/2020

## 2020-08-01 NOTE — Procedures (Signed)
Central Venous Catheter Insertion Procedure Note  Dayron Dietert  [id number]  [birth year]  Date:08/01/20  Time:6:27 PM   Provider Performing:Boleslaus Holloway   Procedure: Insertion of Non-tunneled Central Venous Catheter(36556)with US guidance [id number])    Indication(s) Hemodialysis  Consent Risks of the procedure as well as the alternatives and risks of each were explained to the patient and/or caregiver.  Consent for the procedure was obtained and is signed in the bedside chart  Anesthesia Topical only with 1% lidocaine   Timeout Verified patient identification, verified procedure, site/side was marked, verified correct patient position, special equipment/implants available, medications/allergies/relevant history reviewed, required imaging and test results available.  Sterile Technique Maximal sterile technique including full sterile barrier drape, hand hygiene, sterile gown, sterile gloves, mask, hair covering, sterile ultrasound probe cover (if used).  Procedure Description Area of catheter insertion was cleaned with chlorhexidine and draped in sterile fashion.   With real-time ultrasound guidance a 20cm 53F Power Trialysis  HD catheter was placed into the left femoral vein.  Nonpulsatile blood flow and easy flushing noted in all ports.  The catheter was sutured in place and sterile dressing applied. 2 attempts required as unable to pass wire from a lower puncture site.   Complications/Tolerance None; patient tolerated the procedure well. Chest X-ray is ordered to verify placement for internal jugular or subclavian cannulation.  Chest x-ray is not ordered for femoral cannulation.  EBL Minimal  Specimen(s) None  renal artery stenosis

## 2020-08-01 NOTE — Progress Notes (Signed)
Patient ID: Danny Carpenter, male   DOB: Jan 19, 1951, 70 y.o.   MRN: [id number] [facility] Evening Rounds:  Hemodynamically stable on NE 7, Impella a P8.  Started on CRRT today due to large volume overload, oliguria on lasix drip and rising creat.  Remains sedated on vent.  BMET    Component Value Date/Time   NA 137 08/01/2020 1646   K 4.4 08/01/2020 1646   CL 107 08/01/2020 1646   CO2 25 08/01/2020 1646   GLUCOSE 125 (H) 08/01/2020 1646   BUN 77 (H) 08/01/2020 1646   CREATININE 3.69 (H) 08/01/2020 1646   CALCIUM 7.2 (L) 08/01/2020 1646   GFRNONAA 17 (L) 08/01/2020 1646   GFRAA >60 08/08/2019 [id number]

## 2020-08-01 NOTE — Progress Notes (Signed)
NAMEKaylem Carpenter, MRN:  [id number], DOB:  [birth year], LOS: 12 ADMISSION DATE:  07/17/2020, CONSULTATION DATE: 07/28/2018 REFERRING MD: Dr. Orvan Seen, CHIEF COMPLAINT: Ventilator dependent  History of Present Illness:  Danny Carpenter is a 70 year old male with past medical history significant for hypertension, hyperlipidemia, type 2 diabetes, CVA, AAA, left foot amputation s/p fem pop bypass and heart failure who presented to the emergency department 4/21 after undergoing outpatient stress test that revealed EF of 20% and acute ischemia.  Since admission patient has been evaluated by cardiology and cardiothoracic surgery and patient underwent TEE and left heart cath.  LHC/RHC revealed severe three-vessel coronary artery disease with moderately elevated left heart and pulmonary artery pressures.  At that time patient was placed on goal-directed medical therapy for acute systolic congestive heart failure and CTS surgery was consulted for CABG potential.  Patient underwent CABG x5 with Dr. Orvan Seen 4/26, post CABG CODE BLUE called due to low flow on Impella, CTS notified.  Patient underwent bedside exploratory postoperative open heart where Dr. Julien Girt reopened the chest incision and hematoma was removed, patient's hemodynamics improved upon opening sternotomy.  PCCM consulted morning of 4/28 due to difficulty weaning ventilator  Pertinent  Medical History  Hypertension, hyperlipidemia, type 2 diabetes, CVA, AAA, left foot amputation s/p fem pop bypass and heart failure   Significant Hospital Events: Including procedures, antibiotic start and stop dates in addition to other pertinent events   . 4/21 presented to the ED for evaluation of abnormal stress test . 4/22 left and right heart cath severe multivessel disease . 4/26 CABG x5 with hemodynamic instability post requiring brief CPR with eventual return back to the OR anastomosis bleeding was seen . 4/28 remains ventilator dependent, slowly weaning  PEEP . 5/1 remains on vent, diuresing, no ready to come off   Interim History / Subjective:   More awake today on less sedation but with occasional ventilator dyssynchrony.  Persistent low grade fever.   Objective   Blood pressure 98/66, pulse 95, temperature (!) 101.1 F (38.4 C), resp. rate 19, height '6\' 1"'$  (1.854 m), weight 129.5 kg, SpO2 99 %. PAP: (37-49)/(22-38) 39/26 CVP:  [14 mmHg-27 mmHg] 16 mmHg PCWP:  [33 mmHg] 33 mmHg CO:  [4.5 L/min-7.6 L/min] 7.6 L/min CI:  [2 L/min/m2-3.3 L/min/m2] 3.3 L/min/m2  Vent Mode: PRVC FiO2 (%):  [40 %-60 %] 40 % Set Rate:  [20 bmp] 20 bmp Vt Set:  [630 mL] 630 mL PEEP:  [5 cmH20] 5 cmH20 Plateau Pressure:  [26 cmH20-33 cmH20] 28 cmH20   Intake/Output Summary (Last 24 hours) at 08/01/2020 0754 Last data filed at 08/01/2020 0700 Gross per 24 hour  Intake 4136.22 ml  Output 2530 ml  Net 1606.22 ml   Filed Weights   07/30/20 0045 07/31/20 0400 08/01/20 0500  Weight: 125.4 kg 127.4 kg 129.5 kg    Examination: General: obese male, intubated and sedated.  HEENT: ETT in place, NGT small bore feeding tube. No pressure injury.  Neuro: sedated, RASS -4, minimal grimace to painful stimulation.  CV: well-healed sternotomy scar. HS normal, no rub. Extremities are warm with improved capillary refill. PULM: normal vesicular breath sounds bilaterally with crackles at bases.  Dyssynchronous with large gasping spontaneous breaths GI: soft, mild distension, rare bowel sounds. Rectal tube in place. GU: Foley catheter in place, marked scrotal edema.  Extremities: dependent edema, L forefoot amputation stump.  Skin: no rash, all line sites intact.  Labs/imaging that I have personally reviewed    4/27 Limited echo  to evaluate Impella, good positioning, EF less than 20%  4/29 CBC Plt 110 Hgb 9.7 BMP. Coox 57. CXR with stable support devices, R pleural effusion and some pulmonary edema   5/1 remains on vent. Diuresing.   5/3 off milrinone and  vasopressin.   Resolved Hospital Problem list     Assessment & Plan:   Critically ill due to acute hypoxic respiratory failure  R pleural effusion, mild pulmonary edema  Cardiogenic Shock CAD sp CABG x5 AKI Anemia, post op  Thrombocytopenia - stable  Low grade pyrexia of unclear etiology PAD s/p partial L foot amputation  AAA Poorly controlled DM2 with microvascular complications.   Plan:  - Wean FiO2, can use increased PEEP up to 10 if needed for hypoxia.  - Attempt to wean to pressure support to limit dyssynchrony.  - Wean NE to keep MAP >65. Ideally have patient on MCS alone   - Optimize glycemic control.  - Continue diuresis, follow creatinine - likely multifactorial with significant cardio-renal component.  If not improving may need CRRT.  - Cultures negative, will set 7day course of antibiotics. - Fever may be from resorbing clot. Will switch introducer to TLC and remove PAC - Given improving hemodynamics and normal WBC, will hold on changing antibiotic coverage.   Best practice   Diet:  Tube Feed  Pain/Anxiety/Delirium protocol (if indicated): Yes (RASS goal -1) continue current strategy VAP protocol (if indicated): Yes DVT prophylaxis: Systemic AC - Heparin in Impella purge. GI prophylaxis: PPI Glucose control:  SSI Yes - add tube feed insulin and Lantus.  Central venous access:  Yes, and it is still needed, will change out [facility] Arterial line:  Yes, and it is still needed Foley:  Yes, and it is still needed Mobility:  bed rest  PT consulted: N/A Last date of multidisciplinary goals of care discussion Per primary Code Status:  full code Disposition: ICU  Labs   CBC: Recent Labs  Lab 07/28/20 1535 07/28/20 2323 07/29/20 0358 07/29/20 0404 07/30/20 0331 07/30/20 0339 07/31/20 0131 07/31/20 0324 07/31/20 0337 08/01/20 0253  WBC 6.4  --  5.4  --  7.5  --   --   --  8.5 7.4  HGB 9.8*   < > 9.3*   < > 9.3* 9.5* 9.2* 9.2* 8.7* 8.4*  HCT 30.5*   < > 28.9*    < > 29.5* 28.0* 27.0* 27.0* 27.8* 27.7*  MCV 89.2  --  90.0  --  89.7  --   --   --  90.3 92.6  PLT 104*  --  90*  --  112*  --   --   --  114* 128*   < > = values in this interval not displayed.    Basic Metabolic Panel: Recent Labs  Lab 07/28/20 0322 07/28/20 0823 07/28/20 1535 07/28/20 2323 07/29/20 0358 07/29/20 0404 07/29/20 1522 07/30/20 0331 07/30/20 [id number] 07/30/20 0559 07/30/20 1655 07/31/20 0131 07/31/20 0324 07/31/20 0337 07/31/20 1640 08/01/20 0253  NA  --   --  136   < > 137   < > 137  --    < > 136 136 137 138 136 134* 137  K  --   --  3.7   < > 4.2   < > 4.0  --    < > 3.4* 4.0 4.5 4.2 4.2 4.2 4.0  CL  --   --  102  --  103  --  104  --   --  102 103  --   --  103 102 103  CO2  --   --  25  --  22  --  22  --   --  24 24  --   --  '24 24 24  '$ GLUCOSE  --   --  117*  --  199*  --  328*  --   --  333* 312*  --   --  375* 394* 337*  BUN  --   --  29*  --  35*  --  41*  --   --  46* 51*  --   --  59* 67* 70*  CREATININE  --   --  1.60*  --  1.82*  --  1.95*  --   --  2.07* 2.25*  --   --  2.48* 2.61* 2.89*  CALCIUM  --   --  7.7*  --  7.3*  --  7.2*  --   --  7.4* 7.3*  --   --  7.4* 7.1* 7.4*  MG  --  1.8 2.6*  --  2.6*  --  2.4 2.3  --   --   --   --   --   --   --   --   PHOS   < >  --  4.2  --  4.5  --  3.8 3.0  --  3.0  --   --   --  2.1*  --   --    < > = values in this interval not displayed.   GFR: Estimated Creatinine Clearance: 34.5 mL/min (A) (by C-G formula based on SCr of 2.89 mg/dL (H)). Recent Labs  Lab 07/28/20 1116 07/28/20 1535 07/29/20 0358 07/30/20 0331 07/31/20 0337 08/01/20 0253  PROCALCITON 4.06  --  7.50 5.06  --   --   WBC  --  6.4 5.4 7.5 8.5 7.4  LATICACIDVEN  --  1.0 0.9  --   --   --     Liver Function Tests: Recent Labs  Lab 07/28/2020 1933 07/26/20 1409 07/27/20 0304 07/30/20 0559 07/31/20 0337  AST 34 26 21  --   --   ALT '17 13 14  '$ --   --   ALKPHOS 33* 28* 38  --   --   BILITOT 2.4* 1.2 1.1  --   --   PROT 4.7* 4.9*  5.0*  --   --   ALBUMIN 3.6 3.4* 3.1* 2.5* 2.2*   No results for input(s): LIPASE, AMYLASE in the last 168 hours. No results for input(s): AMMONIA in the last 168 hours.  ABG    Component Value Date/Time   PHART 7.314 (L) 07/31/2020 0324   PCO2ART 48.4 (H) 07/31/2020 0324   PO2ART 69 (L) 07/31/2020 0324   HCO3 24.4 07/31/2020 0324   TCO2 26 07/31/2020 0324   ACIDBASEDEF 2.0 07/31/2020 0324   O2SAT 82.0 08/01/2020 0307     Coagulation Profile: Recent Labs  Lab 07/24/2020 1617 07/12/2020 1933  INR 1.4* 1.5*    Cardiac Enzymes: No results for input(s): CKTOTAL, CKMB, CKMBINDEX, TROPONINI in the last 168 hours.  HbA1C: Hgb A1c MFr Bld  Date/Time Value Ref Range Status  07/24/2020 06:28 PM 11.6 (H) 4.8 - 5.6 % Final    Comment:    (NOTE) Pre diabetes:          5.7%-6.4%  Diabetes:              >6.4%  Glycemic control for   <7.0% adults with diabetes   08/05/2019 03:09 AM 11.6 (H) 4.8 - 5.6 % Final    Comment:    (NOTE) Pre diabetes:          5.7%-6.4% Diabetes:              >6.4% Glycemic control for   <7.0% adults with diabetes     CBG: Recent Labs  Lab 08/01/20 0300 08/01/20 0406 08/01/20 0507 08/01/20 0558 08/01/20 0658  GLUCAP 310* 306* 290* 233* 239*    This patient is critically ill with multiple organ system failure; which, requires frequent high complexity decision making, assessment, support, evaluation, and titration of therapies. This was completed through the application of advanced monitoring technologies and extensive interpretation of multiple databases. During this encounter critical care time was devoted to patient care services described in this note for 45 minutes.  Kipp Brood, MD [facility] 08/01/2020 7:54 AM

## 2020-08-01 NOTE — Consult Note (Signed)
Danny Carpenter Admit Date: 07/28/2020 08/01/2020 Rexene Agent Requesting Physician:  Lynetta Mare MD  Reason for Consult:  AKI, Hypervolemic, Relative diuretic resistance HPI:  32M admit 4/21 after found to have new Acute sCHF and ICM s/p 5V CABG 4/26 c/b prolonged shock (cardiogenic now some vasodilatory req Impella), VDRF, progressive hypervolemia and inc dosing of diuretics, with progressive renal failure diuretic resistance  PMH Incudes:  CAD/ICM/sCHF as above, acute  PAD hx/o L FemPop bypass  AAA  DM2  Hx/o ischemic CVA  S/p L foot amputation  See trend in SCr below. Note date of CABG 4/26.  No recent contrast exposures identified.  No recent obvious nephrotoxins.  With progressive edema (weigth up 20kg) has b een on furosemide gtt, some metolazone, and still not able to achieve negative fluid balance.  K 4.0, HCO3 24.  UA 4/25 was bland except trace protein, no hematuria or pyuria. Has foley catheter.    Cont on Vent at 40% FIO2, impella, NE, and lasix gtt.    Creatinine, Ser (mg/dL)  Date Value  08/01/2020 2.89 (H)  07/31/2020 2.61 (H)  07/31/2020 2.48 (H)  07/30/2020 2.25 (H)  07/30/2020 2.07 (H)  07/29/2020 1.95 (H)  07/29/2020 1.82 (H)  07/28/2020 1.60 (H)  07/28/2020 1.24  07/27/2020 1.17  ] I/Os: I/O last 3 completed shifts: In: 5617.4 [I.V.:2761.1; Other:415.4; KA/JG:8115; IV Piggyback:505.9] Out: 4450 [Urine:4050; Stool:100; Chest Tube:300]   ROS NSAIDS: no exposure identified IV Contrast no identified recent exposure TMP/SMX no exposure identified Hypotension present Balance of 12 systems is negative w/ exceptions as above  PMH  Past Medical History:  Diagnosis Date  . AAA (abdominal aortic aneurysm) without rupture ([place]) 06/11/2019   infrarenal   Last Assessment & Plan:  Uptodate.  Stable at 4.1cm Repeat visit to vascular surg in 12/2015.  Marland Kitchen Anxiety   . Arthritis   . Chronic fatigue 01/09/2015   Last Assessment & Plan:  Chronic fatigue. Suspect  multifactorial including OSA, hyperglycemia (patient not taking insulin as prescribed), pain medications. No other constitutional sx.  Will plan for repeat lab work.   - follow up in 1 mth after sleep study results.  . Chronic pain 04/05/2013   Last Assessment & Plan:   Known Degenerative disc disease and bilateral peripheral neuropathic leg pain   continue cymbalta 61m  reports has trialed injections in past as well as  576mhydrocodone with no success. Also tried tramadol in the past with no benefit.  Prescribed Norco 7.5/325mg Hydrocodone/ Acetaminophen 1-1.5 tabs every 8 hours prn #150. 1 months through 04/17/14  UTox 06/14/14 app  . COPD (chronic obstructive pulmonary disease) ([facility]  . CVA (cerebral vascular accident) ([facility]  . DDD (degenerative disc disease) 10/08/2004  . Depression   . Diabetic foot infection ([facility]/18/2020  . Fracture of rib    Left  . GERD (gastroesophageal reflux disease)   . HTN (hypertension)   . Hypercholesteremia   . Hyperlipidemia 06/11/2019   On Lipitor 8038mer day  Last Assessment & Plan:  PLAN: HLD continue Lipitor 3m29mily  . Osteomyelitis (HCC)[place]/08/2019   left foot  . PAD (peripheral artery disease) (HCC)[place]/28/2020  . Peripheral vascular disease (HCC)[place]. PONV (postoperative nausea and vomiting)   . PVD (peripheral vascular disease) (HCC)[place]. [facility]  . Senile nuclear sclerosis 12/06/2011  . Sleep apnea    no cpap  . Subacute osteomyelitis of left foot (  [place])   . Type 2 diabetes mellitus with other specified complication ([place])    [place]  Past Surgical History:  Procedure Laterality Date  . ABDOMINAL AORTOGRAM W/LOWER EXTREMITY N/A 01/19/2019   Procedure: ABDOMINAL AORTOGRAM W/LOWER EXTREMITY;  Surgeon: Serafina Mitchell, MD;  Location: [place] CV LAB;  Service: Cardiovascular;  Laterality: N/A;  . ABDOMINAL AORTOGRAM W/LOWER EXTREMITY Left 06/17/2019   Procedure: ABDOMINAL AORTOGRAM W/LOWER EXTREMITY;  Surgeon:  Marty Heck, MD;  Location: [place] CV LAB;  Service: Cardiovascular;  Laterality: Left;  . AMPUTATION Left 02/22/2019   Procedure: LEFT FOOT 5TH RAY AMPUTATION;  Surgeon: Newt Minion, MD;  Location: [place];  Service: Orthopedics;  Laterality: Left;  . APPENDECTOMY    . CORONARY ARTERY BYPASS GRAFT N/A 07/27/2020   Procedure: CORONARY ARTERY BYPASS GRAFTING (CABG) ON PUMP X FIVE  USING LEFT INTERNAL MAMMARY ARTERY AND RIGHT ENDOSCOPIC VEIN HARVEST CONDUITS, INSERTION OF INTRA AORTIC BALLOON PUMP IN RIGHT FEMORAL ARTERY,  RIGHT AXILLARY CANNULATION WITH 10MM HEMASHIELD GRAFT;  Surgeon: Wonda Olds, MD;  Location: [place];  Service: Open Heart Surgery;  Laterality: N/A;  . ENDARTERECTOMY FEMORAL Left 01/27/2019   Procedure: ENDARTERECTOMY LEFT FEMORAL ARTERY AND PROFUNDA  ARTERY;  Surgeon: Serafina Mitchell, MD;  Location: [place];  Service: Vascular;  Laterality: Left;  . EXPLORATION POST OPERATIVE OPEN HEART N/A 07/22/2020   Procedure: BEDSIDE EXPLORATION POST OPERATIVE OPEN HEART;  Surgeon: Wonda Olds, MD;  Location: [place];  Service: Open Heart Surgery;  Laterality: N/A;  . EXPLORATION POST OPERATIVE OPEN HEART N/A 07/24/2020   Procedure: EXPLORATION POST OPERATIVE OPEN HEART;  Surgeon: Wonda Olds, MD;  Location: [place];  Service: Open Heart Surgery;  Laterality: N/A;  . FEMORAL-POPLITEAL BYPASS GRAFT Left 01/27/2019   Procedure: BYPASS GRAFT FEMORAL-POPLITEAL ARTERY;  Surgeon: Serafina Mitchell, MD;  Location: [place];  Service: Vascular;  Laterality: Left;  . FINGER AMPUTATION     Left second and third fingers, work accident  . INTRAVASCULAR PRESSURE WIRE/FFR STUDY N/A 07/11/2020   Procedure: INTRAVASCULAR PRESSURE WIRE/FFR STUDY;  Surgeon: Nelva Bush, MD;  Location: [place] CV LAB;  Service: Cardiovascular;  Laterality: N/A;  . KNEE SURGERY Left   . LOWER EXTREMITY ANGIOGRAM  05/2008   Left SFA atherectomy with diamondback 2.25 utilizing distal protection  device NAV6 placed in the left popliteal artery. Angioplasty of the left SFA with ATV balloon 6 x 40   . PATCH ANGIOPLASTY Left 01/27/2019   Procedure: Patch Angioplasty using Vein;  Surgeon: Serafina Mitchell, MD;  Location: [place];  Service: Vascular;  Laterality: Left;  . PERIPHERAL VASCULAR BALLOON ANGIOPLASTY Left 06/17/2019   Procedure: PERIPHERAL VASCULAR BALLOON ANGIOPLASTY;  Surgeon: Marty Heck, MD;  Location: [place] CV LAB;  Service: Cardiovascular;  Laterality: Left;  left proximal and mid bypass  . RIGHT/LEFT HEART CATH AND CORONARY ANGIOGRAPHY N/A 06/30/2020   Procedure: RIGHT/LEFT HEART CATH AND CORONARY ANGIOGRAPHY;  Surgeon: Nelva Bush, MD;  Location: [place] [facility];  Service: Cardiovascular;  Laterality: N/A;  . SHOULDER SURGERY Right   . TEE WITHOUT CARDIOVERSION N/A 07/22/2020   Procedure: TRANSESOPHAGEAL ECHOCARDIOGRAM (TEE);  Surgeon: Wonda Olds, MD;  Location: [place];  Service: Open Heart Surgery;  Laterality: N/A;  . TRANSMETATARSAL AMPUTATION Left 08/06/2019   Procedure: TRANSMETATARSAL AMPUTATION;  Surgeon: Landis Martins, DPM;  Location: [place];  Service: Podiatry;  Laterality: Left;  Available after 5pm   . VEIN HARVEST Left 01/27/2019  Procedure: Left Saphenous Vein Harvest;  Surgeon: Brabham, Vance W, MD;  [facility];  Service: Vascular;  Laterality: Left;   FH  Family History  Problem Relation Age of Onset  . Cancer Mother   . Cancer Father    SH  reports that he quit smoking about 11 years ago. His smoking use included cigarettes. He has a 40.00 pack-year smoking history. He has never used smokeless tobacco. He reports previous alcohol use. He reports that he does not use drugs. Allergies No Known Allergies Home medications Prior to Admission medications   Medication Sig Start Date End Date Taking? Authorizing Provider  amLODipine (NORVASC) 10 MG tablet Take 1 tablet (10 mg total) by mouth daily. 01/21/19  Yes Masoudi,  Elhamalsadat, MD  aspirin EC 81 MG tablet Take 81 mg by mouth daily. Swallow whole.   Yes [provider]  clopidogrel (PLAVIX) 75 MG tablet Take 75 mg by mouth daily.   Yes [provider]  DULoxetine (CYMBALTA) 30 MG capsule Take 30 mg by mouth daily.   Yes [provider]  gabapentin (NEURONTIN) 600 MG tablet Take 600 mg by mouth 3 (three) times daily.   Yes [provider]  Insulin Degludec (TRESIBA) 100 UNIT/ML SOLN Inject 62 Units into the skin in the morning. Every morning to control blood sugar   Yes [provider]  levocetirizine (XYZAL) 5 MG tablet Take 5 mg by mouth at bedtime.   Yes [provider]  lisinopril (ZESTRIL) 40 MG tablet Take 40 mg by mouth daily.   Yes [provider]  metFORMIN (GLUCOPHAGE) 1000 MG tablet Take 1,000 mg by mouth 2 (two) times daily with a meal.   Yes [provider]  nitroGLYCERIN (NITROSTAT) 0.4 MG SL tablet Place 0.4 mg under the tongue every 5 (five) minutes as needed for chest pain.   Yes [provider]  pantoprazole (PROTONIX) 40 MG tablet Take 40 mg by mouth daily before breakfast.   Yes [provider]  rosuvastatin (CRESTOR) 20 MG tablet Take 20 mg by mouth daily.   Yes [provider]  Vitamin D, Ergocalciferol, (DRISDOL) 1.25 MG (50000 UNIT) CAPS capsule Take 50,000 Units by mouth 2 (two) times a week.   Yes [provider]    Current Medications Scheduled Meds: . sodium chloride   Intravenous Once  . sodium chloride   Intravenous Once  . amiodarone  200 mg Per Tube BID  . aspirin EC  325 mg Oral Daily   Or  . aspirin  324 mg Per Tube Daily  . bisacodyl  10 mg Oral Daily   Or  . bisacodyl  10 mg Rectal Daily  . chlorhexidine gluconate (MEDLINE KIT)  15 mL Mouth Rinse BID  . Chlorhexidine Gluconate Cloth  6 each Topical Daily  . docusate  200 mg Per Tube Daily  . feeding supplement (PROSource TF)  45 mL Per Tube QID  . insulin  aspart  10 Units Subcutaneous Q4H  . insulin aspart  3-9 Units Subcutaneous Q4H  . insulin detemir  29 Units Subcutaneous Q12H  . mouth rinse  15 mL Mouth Rinse 10 times per day  . pantoprazole sodium  40 mg Per Tube Daily  . polyethylene glycol  17 g Per Tube Daily  . rosuvastatin  40 mg Per Tube Daily  . sodium chloride flush  3 mL Intravenous Q12H   Continuous Infusions: . sodium chloride Stopped (08/01/20 1202)  . ceFEPime (MAXIPIME) IV Stopped (08/01/20   1240)  . dexmedetomidine (PRECEDEX) IV infusion 0.1 mcg/kg/hr (08/01/20 1400)  . dextrose    . feeding supplement (VITAL 1.5 CAL) 1,000 mL (08/01/20 0425)  . fentaNYL infusion INTRAVENOUS Stopped (08/01/20 0759)  . furosemide (LASIX) 200 mg in dextrose 5% 100 mL (30m/mL) infusion 15 mg/hr (08/01/20 1400)  . heparin 1,100 Units/hr (08/01/20 1400)  . impella catheter heparin 50 unit/mL in dextrose 5% 11.9 mL/hr at 07/31/20 0148  . lactated ringers    . norepinephrine (LEVOPHED) Adult infusion 11 mcg/min (08/01/20 1400)  . vasopressin Stopped (07/31/20 1606)   PRN Meds:.sodium chloride, acetaminophen, dextrose, fentaNYL, lactated ringers, metoprolol tartrate, ondansetron (ZOFRAN) IV, sodium chloride flush, traMADol  CBC Recent Labs  Lab 07/30/20 0331 07/30/20 0339 07/31/20 0324 07/31/20 0337 08/01/20 0253  WBC 7.5  --   --  8.5 7.4  HGB 9.3*   < > 9.2* 8.7* 8.4*  HCT 29.5*   < > 27.0* 27.8* 27.7*  MCV 89.7  --   --  90.3 92.6  PLT 112*  --   --  114* 128*   < > = values in this interval not displayed.   Basic Metabolic Panel Recent Labs  Lab 07/28/20 1535 07/28/20 2323 07/29/20 0358 07/29/20 0404 07/29/20 1522 07/30/20 0331 07/30/20 0081405/01/22 0559 07/30/20 1655 07/31/20 0131 07/31/20 0324 07/31/20 0337 07/31/20 1640 08/01/20 0253  NA 136   < > 137   < > 137  --    < > 136 136 137 138 136 134* 137  K 3.7   < > 4.2   < > 4.0  --    < > 3.4* 4.0 4.5 4.2 4.2 4.2 4.0  CL 102  --  103  --  104  --   --  102  103  --   --  103 102 103  CO2 25  --  22  --  22  --   --  24 24  --   --  _0 GLUCOSE 117*  --  199*  --  328*  --   --  333* 312*  --   --  375* 394* 337*  BUN 29*  --  35*  --  41*  --   --  46* 51*  --   --  59* 67* 70*  CREATININE 1.60*  --  1.82*  --  1.95*  --   --  2.07* 2.25*  --   --  2.48* 2.61* 2.89*  CALCIUM 7.7*  --  7.3*  --  7.2*  --   --  7.4* 7.3*  --   --  7.4* 7.1* 7.4*  PHOS 4.2  --  4.5  --  3.8 3.0  --  3.0  --   --   --  2.1*  --   --    < > = values in this interval not displayed.    Physical Exam  Blood pressure 94/83, pulse 90, temperature (!) 101.7 F (38.7 C), temperature source Core, resp. rate (!) 9, height 6' 1" (1.854 m), weight 129.5 kg, SpO2 99 %. GEN: Intubated, sedated, Impella in place, anasarca ENT: NCAT, ET tube in place EYES: Eyes closed CV: Regular, normal S1 and S2 PULM: Coarse breath sounds bilaterally ABD: Distended, abdominal wall edema present SKIN: Midline chest incision C/C/I EXT:3+ edema present in the legs   Assessment 78M progressive AKI after CABG with prolonged shock (cardiogenic and ? Vasodilatory/septic), failure to achieve target volume status despite aggressive  diuretics.    1. AKI, progressive, nonoliguric 2/2 cardiorenal syndrome probably some ATN 2. Acute systolic CHF / ICM; impella, milrinone stopped today; on NE; massive vol overload 3. CAD s/p  5V CABG 4/26 4. Anemia, mild, per primary 5. DM2 6. PAD hx/o Fem Pop Bypass 7. Diuretic Resistance on lasix gtt and metolazone 8. Fevers 9. VDRF  Plan 1. Start CRRT after PCCM places temp HD cath: all 4K, 500/1500/300, start with 50 to 100mL net neg/h, no heparin 2. Stop lasix after starting CRRT 3. Will follow closely along    B   08/01/2020, 4:27 PM     

## 2020-08-01 NOTE — Progress Notes (Signed)
150 cc fentanyl wasted in stericycle with Emer Colleran RN at 1400

## 2020-08-02 ENCOUNTER — Inpatient Hospital Stay (HOSPITAL_COMMUNITY): Payer: No Typology Code available for payment source

## 2020-08-02 ENCOUNTER — Inpatient Hospital Stay: Payer: Self-pay

## 2020-08-02 DIAGNOSIS — I25700 Atherosclerosis of coronary artery bypass graft(s), unspecified, with unstable angina pectoris: Secondary | ICD-10-CM | POA: Diagnosis not present

## 2020-08-02 DIAGNOSIS — Z95811 Presence of heart assist device: Secondary | ICD-10-CM | POA: Diagnosis not present

## 2020-08-02 DIAGNOSIS — R571 Hypovolemic shock: Secondary | ICD-10-CM | POA: Diagnosis not present

## 2020-08-02 LAB — COOXEMETRY PANEL
Carboxyhemoglobin: 1.6 % — ABNORMAL HIGH (ref 0.5–1.5)
Methemoglobin: 1 % (ref 0.0–1.5)
O2 Saturation: 63.1 %
Total hemoglobin: 11.4 g/dL — ABNORMAL LOW (ref 12.0–16.0)

## 2020-08-02 LAB — MAGNESIUM: Magnesium: 2.2 mg/dL (ref 1.7–2.4)

## 2020-08-02 LAB — CBC
HCT: 28.9 % — ABNORMAL LOW (ref 39.0–52.0)
Hemoglobin: 8.7 g/dL — ABNORMAL LOW (ref 13.0–17.0)
MCH: 28.3 pg (ref 26.0–34.0)
MCHC: 30.1 g/dL (ref 30.0–36.0)
MCV: 94.1 fL (ref 80.0–100.0)
Platelets: 143 10*3/uL — ABNORMAL LOW (ref 150–400)
RBC: 3.07 MIL/uL — ABNORMAL LOW (ref 4.22–5.81)
RDW: 17.8 % — ABNORMAL HIGH (ref 11.5–15.5)
WBC: 8.4 10*3/uL (ref 4.0–10.5)
nRBC: 1 % — ABNORMAL HIGH (ref 0.0–0.2)

## 2020-08-02 LAB — RENAL FUNCTION PANEL
Albumin: 2.1 g/dL — ABNORMAL LOW (ref 3.5–5.0)
Albumin: 2.2 g/dL — ABNORMAL LOW (ref 3.5–5.0)
Anion gap: 5 (ref 5–15)
Anion gap: 9 (ref 5–15)
BUN: 62 mg/dL — ABNORMAL HIGH (ref 8–23)
BUN: 54 mg/dL — ABNORMAL HIGH (ref 8–23)
CO2: 24 mmol/L (ref 22–32)
CO2: 26 mmol/L (ref 22–32)
Calcium: 7.4 mg/dL — ABNORMAL LOW (ref 8.9–10.3)
Calcium: 7.7 mg/dL — ABNORMAL LOW (ref 8.9–10.3)
Chloride: 106 mmol/L (ref 98–111)
Chloride: 103 mmol/L (ref 98–111)
Creatinine, Ser: 2.75 mg/dL — ABNORMAL HIGH (ref 0.61–1.24)
Creatinine, Ser: 2.28 mg/dL — ABNORMAL HIGH (ref 0.61–1.24)
GFR, Estimated: 24 mL/min — ABNORMAL LOW (ref >60)
GFR, Estimated: 30 mL/min — ABNORMAL LOW (ref >60)
Glucose, Bld: 258 mg/dL — ABNORMAL HIGH (ref 70–99)
Glucose, Bld: 158 mg/dL — ABNORMAL HIGH (ref 70–99)
Phosphorus: 2.2 mg/dL — ABNORMAL LOW (ref 2.5–4.6)
Phosphorus: 3.6 mg/dL (ref 2.5–4.6)
Potassium: 4.7 mmol/L (ref 3.5–5.1)
Potassium: 4.4 mmol/L (ref 3.5–5.1)
Sodium: 135 mmol/L (ref 135–145)
Sodium: 138 mmol/L (ref 135–145)

## 2020-08-02 LAB — ECHOCARDIOGRAM LIMITED
Height: 73 in
Weight: 4511.49 oz

## 2020-08-02 LAB — HEPARIN LEVEL (UNFRACTIONATED)
Heparin Unfractionated: 0.16 IU/mL — ABNORMAL LOW (ref 0.30–0.70)
Heparin Unfractionated: 0.25 IU/mL — ABNORMAL LOW (ref 0.30–0.70)

## 2020-08-02 LAB — GLUCOSE, CAPILLARY
Glucose-Capillary: 142 mg/dL — ABNORMAL HIGH (ref 70–99)
Glucose-Capillary: 143 mg/dL — ABNORMAL HIGH (ref 70–99)
Glucose-Capillary: 156 mg/dL — ABNORMAL HIGH (ref 70–99)
Glucose-Capillary: 157 mg/dL — ABNORMAL HIGH (ref 70–99)
Glucose-Capillary: 165 mg/dL — ABNORMAL HIGH (ref 70–99)
Glucose-Capillary: 212 mg/dL — ABNORMAL HIGH (ref 70–99)

## 2020-08-02 LAB — LACTATE DEHYDROGENASE: LDH: 477 U/L — ABNORMAL HIGH (ref 98–192)

## 2020-08-02 MED ORDER — SODIUM CHLORIDE 0.9 % IV SOLN: INTRAVENOUS | Status: DC | PRN

## 2020-08-02 MED ORDER — B COMPLEX-C PO TABS
1.0000 | ORAL_TABLET | Freq: Every day | ORAL | Status: DC
  Administered 2020-08-02 – 2020-08-12 (×11): 1
  Filled 2020-08-02 (×12): qty 1

## 2020-08-02 MED ORDER — FENTANYL CITRATE (PF) 100 MCG/2ML IJ SOLN
INTRAMUSCULAR | Status: AC
  Administered 2020-08-02: 100 ug via INTRAVENOUS
  Filled 2020-08-02: qty 2

## 2020-08-02 MED ORDER — ARTIFICIAL TEARS OPHTHALMIC OINT
1.0000 "application " | TOPICAL_OINTMENT | Freq: Three times a day (TID) | OPHTHALMIC | Status: DC
  Administered 2020-08-02 – 2020-08-13 (×31): 1 via OPHTHALMIC
  Filled 2020-08-02 (×9): qty 3.5

## 2020-08-02 MED ORDER — METOCLOPRAMIDE HCL 5 MG/ML IJ SOLN
5.0000 mg | Freq: Three times a day (TID) | INTRAMUSCULAR | Status: DC
  Administered 2020-08-02 – 2020-08-10 (×24): 5 mg via INTRAVENOUS
  Filled 2020-08-02 (×24): qty 2

## 2020-08-02 MED ORDER — FENTANYL CITRATE (PF) 100 MCG/2ML IJ SOLN
25.0000 ug | INTRAMUSCULAR | Status: DC | PRN
  Administered 2020-08-06 – 2020-08-11 (×2): 100 ug via INTRAVENOUS
  Administered 2020-08-11: 25 ug via INTRAVENOUS
  Administered 2020-08-12: 100 ug via INTRAVENOUS
  Administered 2020-08-12: 50 ug via INTRAVENOUS
  Administered 2020-08-12 – 2020-08-13 (×2): 100 ug via INTRAVENOUS
  Administered 2020-08-13: 50 ug via INTRAVENOUS
  Administered 2020-08-13: 100 ug via INTRAVENOUS
  Administered 2020-08-13: 50 ug via INTRAVENOUS
  Administered 2020-08-13: 100 ug via INTRAVENOUS
  Administered 2020-08-14: 50 ug via INTRAVENOUS
  Administered 2020-08-18 – 2020-08-20 (×6): 100 ug via INTRAVENOUS
  Administered 2020-08-20: 50 ug via INTRAVENOUS
  Administered 2020-08-20 – 2020-08-21 (×4): 100 ug via INTRAVENOUS
  Administered 2020-08-21: 50 ug via INTRAVENOUS
  Administered 2020-08-21 – 2020-08-23 (×14): 100 ug via INTRAVENOUS
  Filled 2020-08-02 (×34): qty 2

## 2020-08-02 MED ORDER — NEOSTIGMINE METHYLSULFATE 10 MG/10ML IV SOLN
0.2500 mg | Freq: Four times a day (QID) | INTRAVENOUS | Status: DC
  Administered 2020-08-02 – 2020-08-04 (×9): 0.25 mg via SUBCUTANEOUS
  Filled 2020-08-02 (×14): qty 0.25

## 2020-08-02 MED ORDER — SODIUM PHOSPHATES 45 MMOLE/15ML IV SOLN
30.0000 mmol | Freq: Once | INTRAVENOUS | Status: AC
  Administered 2020-08-02: 30 mmol via INTRAVENOUS
  Filled 2020-08-02: qty 10

## 2020-08-02 NOTE — Progress Notes (Signed)
      [address]       [place] [address]             [phone]      Intubated, sedated  BP (!) 141/99   Pulse 87   Temp 97.8 F (36.6 C) (Oral)   Resp 20   Ht '6\' 1"'$  (1.854 m)   Wt 127.9 kg   SpO2 (!) 81%   BMI 37.20 kg/m  PRVC 20/40%, 5 PEEP Norepi at 4 K= 4.4 On CVVH  Intake/Output Summary (Last 24 hours) at 08/02/2020 1831 Last data filed at 08/02/2020 1800 Gross per 24 hour  Intake 2880.64 ml  Output 5184 ml  Net -2303.36 ml   Remains critically ill with multiple organ system dyfunction Continue current Rx  Dailon Sheeran C. Roxan Hockey, MD [facility] [phone]

## 2020-08-02 NOTE — Progress Notes (Signed)
  Echocardiogram 2D Echocardiogram has been performed.  Danny Carpenter 08/02/2020, 10:39 AM

## 2020-08-02 NOTE — Progress Notes (Signed)
Patient vomited up moderate amount of tube feeds. Performed good mouth care and suctioned mouth and airway well. Absent BS at this time. Discussed with Elink, will hold tube feeds for the remainder of the night.

## 2020-08-02 NOTE — Progress Notes (Signed)
No PICC placement per secure chat with Dr. Pearson Grippe. Floor RN and Dr Aundra Dubin, D made aware thru secure chat.

## 2020-08-02 NOTE — Progress Notes (Signed)
Attempted a right radial A-line x 2 using sterile technique.  Unable to thread catheters both times.  Notified Dr Lynetta Mare.

## 2020-08-02 NOTE — Progress Notes (Signed)
Nutrition Follow-up  DOCUMENTATION CODES:   Not applicable  INTERVENTION:   If vomiting continues, recommend advancing Cortrak to post pyloric position  Add B-complex with C  Tube Feeding:  Recommend resuming Vital 1.5 TF at rate of 20 ml/hr; if tolerates, titrate back to goal rate of 55 ml/hr  NUTRITION DIAGNOSIS:   Inadequate oral intake related to acute illness as evidenced by NPO status.  Being addressed via TF   GOAL:   Patient will meet greater than or equal to 90% of their needs  Progressing  MONITOR:   Vent status,TF tolerance,Labs,Weight trends  REASON FOR ASSESSMENT:   Ventilator    ASSESSMENT:   70 yo male admitted with abnormal stress test with chest pain, SOB, elevated troponin with acute on chronic CHF. PMH includes HTN, HLD, DM, CVA, AAA, left food amputation, CHF   4/26 CABG x 5, brief CPR, Intubated 4/29 Cortrak inserted, gastric 5/03 CRRT initiated  Remains on vent support, Impella remains in place.   Vomited over night, TF currently on hold.  Abd xray with mild ileus. Noted initiation of reglan   Small amount of liquid stool in rectal tube  Labs: CBGs 140-212, phosphorus 2.2 (LO) Meds: ss novolog, novolog q 4 hours, levemir, reglan, sodium phosphate   Diet Order:   Diet Order            Diet NPO time specified  Diet effective now                 EDUCATION NEEDS:   Not appropriate for education at this time  Skin:  Skin Assessment: Reviewed RN Assessment  Last BM:  5/4  Height:   Ht Readings from Last 1 Encounters:  07/18/2020 '6\' 1"'$  (1.854 m)    Weight:   Wt Readings from Last 1 Encounters:  08/02/20 127.9 kg    Ideal Body Weight:     BMI:  Body mass index is 37.2 kg/m.  Estimated Nutritional Needs:   Kcal:  2100-2300 kcals  Protein:  130-150 g  Fluid:  >/= 2 L   Kerman Passey MS, RDN, LDN, CNSC Registered Dietitian III Clinical Nutrition RD Pager and On-Call Pager Number Located in [place]

## 2020-08-02 NOTE — Progress Notes (Signed)
8 Days Post-Op Procedure(s) (LRB): EXPLORATION POST OPERATIVE OPEN HEART (N/A) Subjective: sedated  Objective: Vital signs in last 24 hours: Temp:  [97.16 F (36.2 C)-102 F (38.9 C)] 97.3 F (36.3 C) (05/04 0700) Pulse Rate:  [76-105] 92 (05/04 0700) Cardiac Rhythm: A-V Sequential paced (05/04 0400) Resp:  [6-21] 20 (05/04 0700) BP: (90-145)/(61-106) 145/106 (05/04 0400) SpO2:  [95 %-100 %] 100 % (05/04 0700) Arterial Line BP: (88-179)/(52-91) 121/74 (05/04 0700) FiO2 (%):  [40 %] 40 % (05/04 0421) Weight:  [127.9 kg] 127.9 kg (05/04 0415)  Hemodynamic parameters for last 24 hours: PAP: (41-60)/(28-47) 45/28 CVP:  [6 mmHg-29 mmHg] 14 mmHg CO:  [8.3 L/min] 8.3 L/min CI:  [3.6 L/min/m2] 3.6 L/min/m2  Intake/Output from previous day: 05/03 0701 - 05/04 0700 In: 2663.6 [I.V.:783.3; NG/GT:1371.3; IV Piggyback:230.3] Out: 2997 [Urine:1350; Chest Tube:170] Intake/Output this shift: No intake/output data recorded.  General appearance: not toxic Neurologic: moving all extremities Heart: regular rate and rhythm, S1, S2 normal, no murmur, click, rub or gallop Lungs: clear to auscultation bilaterally Abdomen: distended Extremities: edema 2+ Wound: c/d/i  Lab Results: Recent Labs    08/01/20 0253 08/02/20 0316  WBC 7.4 8.4  HGB 8.4* 8.7*  HCT 27.7* 28.9*  PLT 128* 143*   BMET:  Recent Labs    08/01/20 1646 08/02/20 0316  NA 137 135  K 4.4 4.7  CL 107 106  CO2 25 24  GLUCOSE 125* 258*  BUN 77* 62*  CREATININE 3.69* 2.75*  CALCIUM 7.2* 7.4*    PT/INR: No results for input(s): LABPROT, INR in the last 72 hours. ABG    Component Value Date/Time   PHART 7.314 (L) 07/31/2020 0324   HCO3 24.4 07/31/2020 0324   TCO2 26 07/31/2020 0324   ACIDBASEDEF 2.0 07/31/2020 0324   O2SAT 63.1 08/02/2020 0322   CBG (last 3)  Recent Labs    08/01/20 2335 08/02/20 0319 08/02/20 0742  GLUCAP 164* 212* 143*    Assessment/Plan: S/P Procedure(s) (LRB): EXPLORATION POST  OPERATIVE OPEN HEART (N/A) HD  Bowel regimen--try to restart enteral nutrition later via naso jej FT Vent wean-- I favor early trach    LOS: 13 days    Wonda Olds 08/02/2020

## 2020-08-02 NOTE — Progress Notes (Signed)
ANTICOAGULATION CONSULT NOTE  Pharmacy Consult for Heparin Indication: Impella 5.5  No Known Allergies  Patient Measurements: Height: '6\' 1"'$  (185.4 cm) Weight: 127.9 kg (281 lb 15.5 oz) IBW/kg (Calculated) : 79.9 Heparin Dosing Weight: 102.8 kg  Vital Signs: Temp: 97.8 F (36.6 C) (05/04 1551) Temp Source: Oral (05/04 1551) BP: 141/99 (05/04 1538) Pulse Rate: 87 (05/04 1600)  Labs: Recent Labs    07/31/20 0337 07/31/20 0338 08/01/20 0253 08/01/20 1646 08/02/20 0316 08/02/20 1600 08/02/20 1700  HGB 8.7*  --  8.4*  --  8.7*  --   --   HCT 27.8*  --  27.7*  --  28.9*  --   --   PLT 114*  --  128*  --  143*  --   --   HEPARINUNFRC  --    < > 0.16*  --  0.16*  --  0.25*  CREATININE 2.48*   < > 2.89* 3.69* 2.75* 2.28*  --    < > = values in this interval not displayed.    Estimated Creatinine Clearance: 43.5 mL/min (A) (by C-G formula based on SCr of 2.28 mg/dL (H)).   Assessment: 70 yo M presents with NSTEMI and multivessel CAD, now s/p CABG with Impella support. Pharmacy asked to manage systemic heparin.  Heparin paused for line placement yesterday, now resumed. Heparin level now at goal at 0.25, LDH up, CBC stable, Impella flow stable and no bleeding issues.  Goal of Therapy:  Heparin level 0.2-0.3 units/ml Monitor platelets by anticoagulation protocol: Yes   Plan:  Continue heparin purge solution (50 units/ml) Continue IV heparin at 1200 units/hr Check q12h heparin level for now   Erin Hearing PharmD., BCPS Clinical Pharmacist 08/02/2020 6:55 PM

## 2020-08-02 NOTE — Progress Notes (Addendum)
Patient ID: Danny Carpenter, male   DOB: 1950-07-28, 70 y.o.   MRN: [id number]     Advanced Heart Failure Rounding Note  PCP-Cardiologist: None   Subjective:    - 4/26 S/P CABG - 5/4 Swan removed with ongoing fevers, CVVH started  Remains intubated/sedated.   Currently on NE 1.  Co-ox 63%.     CVVH ongoing, weight down 4 lbs.  CVP 18.  Pulling 50-100 cc/hr net negative.   Afebrile now, remains on cefepime, cultures negative.   Impella P8 Flow 4.2 No alarms Waveforms ok  LDH 209 => 247 => 215 => 247=>329=>350 => 477   Objective:   Weight Range: 127.9 kg Body mass index is 37.2 kg/m.   Vital Signs:   Temp:  [97.16 F (36.2 C)-102 F (38.9 C)] 97.3 F (36.3 C) (05/04 0700) Pulse Rate:  [76-105] 92 (05/04 0700) Resp:  [6-21] 20 (05/04 0700) BP: (90-145)/(61-106) 145/106 (05/04 0400) SpO2:  [95 %-100 %] 100 % (05/04 0700) Arterial Line BP: (88-179)/(52-91) 121/74 (05/04 0700) FiO2 (%):  [40 %] 40 % (05/04 0421) Weight:  [127.9 kg] 127.9 kg (05/04 0415) Last BM Date: 07/31/20  Weight change: Filed Weights   07/31/20 0400 08/01/20 0500 08/02/20 0415  Weight: 127.4 kg 129.5 kg 127.9 kg    Intake/Output:   Intake/Output Summary (Last 24 hours) at 08/02/2020 0744 Last data filed at 08/02/2020 0700 Gross per 24 hour  Intake 2663.6 ml  Output 2997 ml  Net -333.4 ml      Physical Exam   CVP 18 General: on vent, NAD Neck: Thick, JVP 16+, no thyromegaly or thyroid nodule.  Lungs: Decreased at bases.  CV: s/p sternotomy.  Heart regular S1/S2, no S3/S4, no murmur.  2+ edema lower legs/forearms.  Abdomen: Soft, nontender, no hepatosplenomegaly, mild distention, no bowel sounds.  Skin: Intact without lesions or rashes.  Neurologic: Eyes open, does not follow commands. Extremities: No clubbing or cyanosis.  HEENT: Normal.    Telemetry   A-V Paced 90s Personally reviewed   Labs    CBC Recent Labs    08/01/20 0253 08/02/20 0316  WBC 7.4 8.4  HGB 8.4* 8.7*   HCT 27.7* 28.9*  MCV 92.6 94.1  PLT 128* 469*   Basic Metabolic Panel Recent Labs    07/31/20 0337 07/31/20 1640 08/01/20 1646 08/02/20 0316  NA 136   < > 137 135  K 4.2   < > 4.4 4.7  CL 103   < > 107 106  CO2 24   < > 25 24  GLUCOSE 375*   < > 125* 258*  BUN 59*   < > 77* 62*  CREATININE 2.48*   < > 3.69* 2.75*  CALCIUM 7.4*   < > 7.2* 7.4*  MG  --   --   --  2.2  PHOS 2.1*  --   --  2.2*   < > = values in this interval not displayed.   Liver Function Tests Recent Labs    07/31/20 0337 08/02/20 0316  ALBUMIN 2.2* 2.1*   No results for input(s): LIPASE, AMYLASE in the last 72 hours. Cardiac Enzymes No results for input(s): CKTOTAL, CKMB, CKMBINDEX, TROPONINI in the last 72 hours.  BNP: BNP (last 3 results) Recent Labs    07/09/2020 1211  BNP 465.5*    ProBNP (last 3 results) No results for input(s): PROBNP in the last 8760 hours.   D-Dimer No results for input(s): DDIMER in the last 72 hours. Hemoglobin A1C  No results for input(s): HGBA1C in the last 72 hours. Fasting Lipid Panel Recent Labs    07/31/20 0338  TRIG 134   Thyroid Function Tests No results for input(s): TSH, T4TOTAL, T3FREE, THYROIDAB in the last 72 hours.  Invalid input(s): FREET3  Other results:   Imaging    DG Abd 1 View  Result Date: 08/02/2020 CLINICAL DATA:  Abdominal distention.  Open-heart surgery. EXAM: ABDOMEN - 1 VIEW COMPARISON:  Chest x-ray 08/01/2020. FINDINGS: Postsurgical changes of the chest. Lines and tubes including Impella device and left chest tube appear to be in stable position where visualized. Feeding tube noted with tip over the distal stomach. Colon is slightly distended. Mild ileus cannot be excluded. No small bowel distention. No free air. Aortoiliac atherosclerotic vascular disease. IMPRESSION: 1. Postsurgical changes chest. Visualized lines and tubes including Impella device and left chest tube appear in stable position where visualized. Feeding tube  noted with tip over the distal stomach. 2. Colon is slightly distended. Mild ileus cannot be excluded. No small bowel distention noted. No free air. Electronically Signed   By: Marcello Moores  Register   On: 08/02/2020 06:33   DG CHEST PORT 1 VIEW  Result Date: 08/01/2020 CLINICAL DATA:  LEFT central line placement, post open heart surgery, intubation, chest tubes EXAM: PORTABLE CHEST 1 VIEW COMPARISON:  Portable exam 1010 hours compared to 0519 hours FINDINGS: Tip of endotracheal tube projects 3.7 cm above carina. Feeding tube extends into stomach. Mediastinal drains and LEFT thoracostomy tube present. RIGHT-side Impella device. New LEFT subclavian central venous catheter with tip projecting over SVC. Upper normal size of cardiac silhouette post median sternotomy. Bibasilar infiltrates versus atelectasis. No pleural effusion or pneumothorax. IMPRESSION: Bibasilar infiltrates versus atelectasis increased on LEFT since previous exam. No pneumothorax following central line placement. Electronically Signed   By: Lavonia Dana M.D.   On: 08/01/2020 10:33   [place] EKG SITE RITE  Result Date: 08/02/2020 If Site Rite image not attached, placement could not be confirmed due to current cardiac rhythm.    Medications:     Scheduled Medications: . sodium chloride   Intravenous Once  . sodium chloride   Intravenous Once  . amiodarone  200 mg Per Tube BID  . aspirin EC  325 mg Oral Daily   Or  . aspirin  324 mg Per Tube Daily  . bisacodyl  10 mg Oral Daily   Or  . bisacodyl  10 mg Rectal Daily  . chlorhexidine gluconate (MEDLINE KIT)  15 mL Mouth Rinse BID  . Chlorhexidine Gluconate Cloth  6 each Topical Daily  . docusate  200 mg Per Tube Daily  . feeding supplement (PROSource TF)  45 mL Per Tube QID  . insulin aspart  10 Units Subcutaneous Q4H  . insulin aspart  3-9 Units Subcutaneous Q4H  . insulin detemir  29 Units Subcutaneous Q12H  . mouth rinse  15 mL Mouth Rinse 10 times per day  . metoCLOPramide (REGLAN)  injection  5 mg Intravenous Q8H  . pantoprazole sodium  40 mg Per Tube Daily  . polyethylene glycol  17 g Per Tube Daily  . rosuvastatin  40 mg Per Tube Daily  . sodium chloride flush  10-40 mL Intracatheter Q12H  . sodium chloride flush  3 mL Intravenous Q12H    Infusions: .  prismasol BGK 4/2.5 500 mL/hr at 08/02/20 0354  .  prismasol BGK 4/2.5 300 mL/hr at 08/01/20 1741  . sodium chloride Stopped (08/01/20 1202)  . ceFEPime (MAXIPIME)  IV Stopped (08/01/20 2214)  . dexmedetomidine (PRECEDEX) IV infusion 0.6 mcg/kg/hr (08/02/20 0700)  . dextrose    . feeding supplement (VITAL 1.5 CAL) Stopped (08/02/20 0345)  . fentaNYL infusion INTRAVENOUS Stopped (08/01/20 0759)  . heparin 1,100 Units/hr (08/02/20 0700)  . impella catheter heparin 50 unit/mL in dextrose 5% 11.9 mL/hr at 07/31/20 0148  . lactated ringers    . norepinephrine (LEVOPHED) Adult infusion 1 mcg/min (08/02/20 0700)  . prismasol BGK 4/2.5 1,500 mL/hr at 08/02/20 0655    PRN Medications: sodium chloride, acetaminophen, dextrose, fentaNYL, heparin, lactated ringers, metoprolol tartrate, ondansetron (ZOFRAN) IV, sodium chloride, sodium chloride flush, sodium chloride flush, traMADol  Assessment/Plan   1. Cardiogenic shock: Ischemic cardiomyopathy, post-CABG on 4/26.  He has Impella 5.5 in place, at P7 with adequate flow and no alarms.  Limited echo 4/29 with EF< 20%, the RV appears normal in size with severe systolic dysfunction. Hemodynamically stable today on NE 1 and Impella P8.  LDH is higher but may be related to initiation of CVVH. Co-ox 63%, CVP 18.  Marked volume overload still. Started CVVH on 5/3, pulling 50-100 cc/hr net negative.   - Can wean off NE if MAP remains stable.  - Can decrease Impella to P7. Continue heparin gtt. Check position under echo => position checked, cannula deep, pulled back Impella under echo guidance to appropriate position, flow rose 0.2 L/min.    - Continue to pull around 100 cc net  negative via CVVH.   2. CAD: s/p CABG x 5 with LIMA-LAD, seq SVG-D1/ramus, seq SVG-PDA/PLV.   - ASA - Crestor 3. Anemia: Post-op bleeding, back to OR with multiple products given on 4/26 post-CABG.  Hgb stable 8.7. 4. Thrombocytopenia: Plts 90K -> 112->114->128->143  today, suspect low post-op/post-surgical bleeding and multiple blood products as well as sepsis.  Stable with Impella.  5. PAD: Extensive history.  6. AAA: Monitoring as outpatient.  7. Type 2 DM: Insulin.  Hgb A1c was 11.6, poor control.  8. Atrial fibrillation: Rate-controlled.  Continue per tube amiodarone for now.   9. Neuro: intubated and sedated  10. ID: Now afebrile.  PCT 7.5 on 4/29.  Currently on cefepime.  - Continue cefepime per CCM.  11. Acute hypoxemic respiratory failure: Pulmonary edema, possible PNA.  On vent, unable to wean just yet with marked volume overload.  May need trach if cannot extubate soon.  - Per CCM.  12. AKI:  CVVH started with marked volume overload and worsening renal function.  Still making some urine.  Avoid hypotension and excessive CVVH rates to allow future renal recovery.  13. Ileus: Tube feeds off with vomiting, no BS.  - AXR - Start Reglan and neostigmine.   CRITICAL CARE Performed by: Loralie Champagne  Total critical care time: 40 minutes  Critical care time was exclusive of separately billable procedures and treating other patients.  Critical care was necessary to treat or prevent imminent or life-threatening deterioration.  Critical care was time spent personally by me on the following activities: development of treatment plan with patient and/or surrogate as well as nursing, discussions with consultants, evaluation of patient's response to treatment, examination of patient, obtaining history from patient or surrogate, ordering and performing treatments and interventions, ordering and review of laboratory studies, ordering and review of radiographic studies, pulse oximetry and  re-evaluation of patient's condition.   Loralie Champagne,  08/02/2020 7:44 AM

## 2020-08-02 NOTE — Progress Notes (Signed)
[place] for Heparin Indication: Impella 5.5  No Known Allergies  Patient Measurements: Height: '6\' 1"'$  (185.4 cm) Weight: 127.9 kg (281 lb 15.5 oz) IBW/kg (Calculated) : 79.9 Heparin Dosing Weight: 102.8 kg  Vital Signs: Temp: 97.3 F (36.3 C) (05/04 0700) Temp Source: Bladder (05/04 0400) BP: 145/106 (05/04 0400) Pulse Rate: 92 (05/04 0700)  Labs: Recent Labs    07/31/20 0337 07/31/20 0338 07/31/20 1253 07/31/20 1640 08/01/20 0253 08/01/20 1646 08/02/20 0316  HGB 8.7*  --   --   --  8.4*  --  8.7*  HCT 27.8*  --   --   --  27.7*  --  28.9*  PLT 114*  --   --   --  128*  --  143*  HEPARINUNFRC  --    < > 0.12*  --  0.16*  --  0.16*  CREATININE 2.48*  --   --    < > 2.89* 3.69* 2.75*   < > = values in this interval not displayed.    Estimated Creatinine Clearance: 36 mL/min (A) (by C-G formula based on SCr of 2.75 mg/dL (H)).   Assessment: 70 yo M presents with NSTEMI and multivessel CAD, now s/p CABG with Impella support. Pharmacy asked to manage systemic heparin.  Heparin paused for line placement yesterday, now resumed. Heparin level below goal at 0.16, LDH up, CBC stable, Impella flow stable and no bleeding issues.  Goal of Therapy:  Heparin level 0.2-0.3 units/ml Monitor platelets by anticoagulation protocol: Yes   Plan:  Continue heparin purge solution (50 units/ml) Increase IV heparin to 1200 units/hr Check q12h heparin level for now   Arrie Senate, PharmD, BCPS, [facility] Clinical Pharmacist [phone] Please check AMION for all [facility] Pharmacy numbers 08/02/2020

## 2020-08-02 NOTE — Procedures (Signed)
Admit: 07/19/2020 LOS: 25  25M progressive AKI after CABG with prolonged shock (cardiogenic and ? Vasodilatory/septic), failure to achieve target volume status despite aggressive diuretics.    Current CRRT Prescription: Start Date: 08/01/20 Catheter: L Fem Temp IJ CCM placed 5/3 BFR: 300 Pre Blood Pump: 500 4K DFR: 1500 4K Replacement Rate: 300 4K Goal UF: 116m net neg/h Anticoagulation: systemic heparin Clotting: infrequent  S: Stable on CRRT, Tolerating UF K 4.7, P 2.2   O: 05/03 0701 - 05/04 0700 In: 2663.6 [I.V.:783.3; NG/GT:1371.3; IV Piggyback:230.3] Out: 2997 [Urine:1350; Chest Tube:170]  Filed Weights   07/31/20 0400 08/01/20 0500 08/02/20 0415  Weight: 127.4 kg 129.5 kg 127.9 kg    Recent Labs  Lab 07/30/20 0559 07/30/20 1655 07/31/20 0337 07/31/20 1640 08/01/20 0253 08/01/20 1646 08/02/20 0316  NA 136   < > 136   < > 137 137 135  K 3.4*   < > 4.2   < > 4.0 4.4 4.7  CL 102   < > 103   < > 103 107 106  CO2 24   < > 24   < > _0 GLUCOSE 333*   < > 375*   < > 337* 125* 258*  BUN 46*   < > 59*   < > 70* 77* 62*  CREATININE 2.07*   < > 2.48*   < > 2.89* 3.69* 2.75*  CALCIUM 7.4*   < > 7.4*   < > 7.4* 7.2* 7.4*  PHOS 3.0  --  2.1*  --   --   --  2.2*   < > = values in this interval not displayed.   Recent Labs  Lab 07/31/20 0337 08/01/20 0253 08/02/20 0316  WBC 8.5 7.4 8.4  HGB 8.7* 8.4* 8.7*  HCT 27.8* 27.7* 28.9*  MCV 90.3 92.6 94.1  PLT 114* 128* 143*    Scheduled Meds: . sodium chloride   Intravenous Once  . sodium chloride   Intravenous Once  . amiodarone  200 mg Per Tube BID  . aspirin EC  325 mg Oral Daily   Or  . aspirin  324 mg Per Tube Daily  . bisacodyl  10 mg Oral Daily   Or  . bisacodyl  10 mg Rectal Daily  . chlorhexidine gluconate (MEDLINE KIT)  15 mL Mouth Rinse BID  . Chlorhexidine Gluconate Cloth  6 each Topical Daily  . docusate  200 mg Per Tube Daily  . feeding supplement (PROSource TF)  45 mL Per Tube QID  .  insulin aspart  10 Units Subcutaneous Q4H  . insulin aspart  3-9 Units Subcutaneous Q4H  . insulin detemir  29 Units Subcutaneous Q12H  . mouth rinse  15 mL Mouth Rinse 10 times per day  . metoCLOPramide (REGLAN) injection  5 mg Intravenous Q8H  . neostigmine  0.25 mg Subcutaneous Q6H  . pantoprazole sodium  40 mg Per Tube Daily  . polyethylene glycol  17 g Per Tube Daily  . rosuvastatin  40 mg Per Tube Daily  . sodium chloride flush  10-40 mL Intracatheter Q12H  . sodium chloride flush  3 mL Intravenous Q12H   Continuous Infusions: .  prismasol BGK 4/2.5 500 mL/hr at 08/02/20 0354  .  prismasol BGK 4/2.5 300 mL/hr at 08/02/20 1014  . sodium chloride Stopped (08/01/20 1202)  . ceFEPime (MAXIPIME) IV Stopped (08/02/20 07793  . dexmedetomidine (PRECEDEX) IV infusion 0.5 mcg/kg/hr (08/02/20 1100)  . dextrose    . feeding  supplement (VITAL 1.5 CAL) Stopped (08/02/20 0345)  . fentaNYL infusion INTRAVENOUS 150 mcg/hr (08/02/20 1100)  . heparin 1,200 Units/hr (08/02/20 1100)  . impella catheter heparin 50 unit/mL in dextrose 5% 11.9 mL/hr at 07/31/20 0148  . lactated ringers    . norepinephrine (LEVOPHED) Adult infusion 6 mcg/min (08/02/20 1100)  . prismasol BGK 4/2.5 1,500 mL/hr at 08/02/20 1013   PRN Meds:.sodium chloride, acetaminophen, dextrose, fentaNYL, fentaNYL (SUBLIMAZE) injection, heparin, lactated ringers, metoprolol tartrate, ondansetron (ZOFRAN) IV, sodium chloride, sodium chloride flush, sodium chloride flush, traMADol  ABG    Component Value Date/Time   PHART 7.314 (L) 07/31/2020 0324   PCO2ART 48.4 (H) 07/31/2020 0324   PO2ART 69 (L) 07/31/2020 0324   HCO3 24.4 07/31/2020 0324   TCO2 26 07/31/2020 0324   ACIDBASEDEF 2.0 07/31/2020 0324   O2SAT 63.1 08/02/2020 0322    A/P  1. Dialysis dependent AKI, progressive, nonoliguric 2/2 cardiorenal syndrome probably some ATN 2. Acute systolic CHF / ICM; impella, off inotropes; on NE; massive vol overload 3. CAD s/p  5V CABG  4/26 4. Anemia, mild, per primary 5. DM2 6. PAD hx/o Fem Pop Bypass 7. Diuretic Resistance with lasix gtt and metolazone 8. Fevers 9. VDRF 10. Hypophoshatemia on CRRT   Cont at current settings and UF goals  Repelte P today    Rexene Agent, MD  [facility]

## 2020-08-02 NOTE — Progress Notes (Signed)
NAMEStamatios Carpenter, MRN:  [id number], DOB:  [birth year], LOS: 55 ADMISSION DATE:  07/19/2020, CONSULTATION DATE: 07/28/2018 REFERRING MD: Dr. Orvan Seen, CHIEF COMPLAINT: Ventilator dependent  History of Present Illness:  Danny Carpenter is a 70 year old male with past medical history significant for hypertension, hyperlipidemia, type 2 diabetes, CVA, AAA, left foot amputation s/p fem pop bypass and heart failure who presented to the emergency department 4/21 after undergoing outpatient stress test that revealed EF of 20% and acute ischemia.  Since admission patient has been evaluated by cardiology and cardiothoracic surgery and patient underwent TEE and left heart cath.  LHC/RHC revealed severe three-vessel coronary artery disease with moderately elevated left heart and pulmonary artery pressures.  At that time patient was placed on goal-directed medical therapy for acute systolic congestive heart failure and CTS surgery was consulted for CABG potential.  Patient underwent CABG x5 with Dr. Orvan Seen 4/26, post CABG CODE BLUE called due to low flow on Impella, CTS notified.  Patient underwent bedside exploratory postoperative open heart where Dr. Julien Girt reopened the chest incision and hematoma was removed, patient's hemodynamics improved upon opening sternotomy.  PCCM consulted morning of 4/28 due to difficulty weaning ventilator  Pertinent  Medical History  Hypertension, hyperlipidemia, type 2 diabetes, CVA, AAA, left foot amputation s/p fem pop bypass and heart failure   Significant Hospital Events: Including procedures, antibiotic start and stop dates in addition to other pertinent events   . 4/21 presented to the ED for evaluation of abnormal stress test . 4/22 left and right heart cath severe multivessel disease . 4/26 CABG x5 with hemodynamic instability post requiring brief CPR with eventual return back to the OR anastomosis bleeding was seen . 4/28 remains ventilator dependent, slowly weaning  PEEP . 5/1 remains on vent, diuresing, no ready to come off  . 5/3 started on CRRT.  Interim History / Subjective:   More awake today on less sedation. Opens eyes. Started on CRRT yesterday.  Tube feeds stopped last pm due to vomiting.  Objective   Blood pressure (!) 145/106, pulse 92, temperature (!) 97.3 F (36.3 C), resp. rate 20, height '6\' 1"'$  (1.854 m), weight 127.9 kg, SpO2 100 %. PAP: (45-60)/(28-47) 45/28 CVP:  [6 mmHg-29 mmHg] 14 mmHg  Vent Mode: PRVC FiO2 (%):  [40 %] 40 % Set Rate:  [20 bmp-30 bmp] 20 bmp Vt Set:  [630 mL] 630 mL PEEP:  [5 cmH20] 5 cmH20 Pressure Support:  [10 cmH20] 10 cmH20 Plateau Pressure:  [24 cmH20-28 cmH20] 27 cmH20   Intake/Output Summary (Last 24 hours) at 08/02/2020 0849 Last data filed at 08/02/2020 0800 Gross per 24 hour  Intake 2554.88 ml  Output 3119 ml  Net -564.12 ml   Filed Weights   07/31/20 0400 08/01/20 0500 08/02/20 0415  Weight: 127.4 kg 129.5 kg 127.9 kg    Examination: General: obese male, intubated and sedated.  HEENT: ETT in place, NGT small bore feeding tube. No pressure injury.  Neuro: sedated, RASS -3, minimal grimace to painful stimulation. Tracks to voice. CV: well-healed sternotomy scar. HS normal, no rub. Extremities are warm with improved capillary refill. PULM: normal vesicular breath sounds bilaterally with crackles at bases.  Tolerating pressure support GI: soft, mild distension, rare bowel sounds. Rectal tube in place. GU: Foley catheter in place, marked scrotal edema.  Extremities: dependent edema, L forefoot amputation stump.  Skin: no rash, all line sites intact.  POC echo personally performed showing deep Impella position. Improved LV function.   Labs/imaging that I  have personally reviewed    5/3 KUB shows possible colonic ileus  Resolved Hospital Problem list     Assessment & Plan:   Critically ill due to acute hypoxic respiratory failure  R pleural effusion, mild pulmonary edema  Cardiogenic  Shock CAD sp CABG x5 AKI Colonic ileus Anemia, post op  Thrombocytopenia - stable  Low grade pyrexia of unclear etiology PAD s/p partial L foot amputation  AAA Poorly controlled DM2 with microvascular complications.   Plan:  - Wean FiO2, can use increased PEEP up to 10 if needed for hypoxia.  - Continue pressure support to limit dyssynchrony.  - Wean NE to keep MAP >65. Ideally have patient on MCS alone  - Now off milrinone with improving LV function. - Drop Impella to P7 and pull back.  - Optimize glycemic control.  - Continue CRRT for fluid removal, follow creatinine .  - Cultures negative, will set 7day course of antibiotics. - Given improving hemodynamics and normal WBC, will hold on changing antibiotic coverage.  - Prokinetics for ileus  Best practice   Diet:  Tube Feed  - on hold for ileus, started Reglan and neostigmine.  Pain/Anxiety/Delirium protocol (if indicated): Yes (RASS goal -1) - fentanyl prn, wean Precedex as tolerated VAP protocol (if indicated): Yes DVT prophylaxis: Systemic AC - Heparin in Impella purge. GI prophylaxis: PPI Glucose control:  SSI Yes - added tube feed insulin and Lantus.  Central venous access:  Yes, and it is still needed, Arterial line:  Yes, and it is still needed Foley:  Yes, and it is still needed - Can remove now that on CRRT Mobility:  bed rest  PT consulted: N/A Last date of multidisciplinary goals of care discussion Per primary Code Status:  full code Disposition: ICU  Labs   CBC: Recent Labs  Lab 07/29/20 0358 07/29/20 0404 07/30/20 0331 07/30/20 0339 07/31/20 0131 07/31/20 0324 07/31/20 0337 08/01/20 0253 08/02/20 0316  WBC 5.4  --  7.5  --   --   --  8.5 7.4 8.4  HGB 9.3*   < > 9.3*   < > 9.2* 9.2* 8.7* 8.4* 8.7*  HCT 28.9*   < > 29.5*   < > 27.0* 27.0* 27.8* 27.7* 28.9*  MCV 90.0  --  89.7  --   --   --  90.3 92.6 94.1  PLT 90*  --  112*  --   --   --  114* 128* 143*   < > = values in this interval not  displayed.    Basic Metabolic Panel: Recent Labs  Lab 07/28/20 1535 07/28/20 2323 07/29/20 0358 07/29/20 0404 07/29/20 1522 07/30/20 0331 07/30/20 0339 07/30/20 0559 07/30/20 1655 07/31/20 0337 07/31/20 1640 08/01/20 0253 08/01/20 1646 08/02/20 0316  NA 136   < > 137   < > 137  --    < > 136   < > 136 134* 137 137 135  K 3.7   < > 4.2   < > 4.0  --    < > 3.4*   < > 4.2 4.2 4.0 4.4 4.7  CL 102  --  103  --  104  --   --  102   < > 103 102 103 107 106  CO2 25  --  22  --  22  --   --  24   < > '24 24 24 25 24  '$ GLUCOSE 117*  --  199*  --  328*  --   --  333*   < > 375* 394* 337* 125* 258*  BUN 29*  --  35*  --  41*  --   --  46*   < > 59* 67* 70* 77* 62*  CREATININE 1.60*  --  1.82*  --  1.95*  --   --  2.07*   < > 2.48* 2.61* 2.89* 3.69* 2.75*  CALCIUM 7.7*  --  7.3*  --  7.2*  --   --  7.4*   < > 7.4* 7.1* 7.4* 7.2* 7.4*  MG 2.6*  --  2.6*  --  2.4 2.3  --   --   --   --   --   --   --  2.2  PHOS 4.2  --  4.5  --  3.8 3.0  --  3.0  --  2.1*  --   --   --  2.2*   < > = values in this interval not displayed.   GFR: Estimated Creatinine Clearance: 36 mL/min (A) (by C-G formula based on SCr of 2.75 mg/dL (H)). Recent Labs  Lab 07/28/20 1116 07/28/20 1535 07/29/20 0358 07/30/20 0331 07/31/20 0337 08/01/20 0253 08/01/20 0819 08/02/20 0316  PROCALCITON 4.06  --  7.50 5.06  --   --   --   --   WBC  --  6.4 5.4 7.5 8.5 7.4  --  8.4  LATICACIDVEN  --  1.0 0.9  --   --   --  0.8  --     Liver Function Tests: Recent Labs  Lab 07/26/20 1409 07/27/20 0304 07/30/20 0559 07/31/20 0337 08/02/20 0316  AST 26 21  --   --   --   ALT 13 14  --   --   --   ALKPHOS 28* 38  --   --   --   BILITOT 1.2 1.1  --   --   --   PROT 4.9* 5.0*  --   --   --   ALBUMIN 3.4* 3.1* 2.5* 2.2* 2.1*   No results for input(s): LIPASE, AMYLASE in the last 168 hours. No results for input(s): AMMONIA in the last 168 hours.  ABG    Component Value Date/Time   PHART 7.314 (L) 07/31/2020 0324    PCO2ART 48.4 (H) 07/31/2020 0324   PO2ART 69 (L) 07/31/2020 0324   HCO3 24.4 07/31/2020 0324   TCO2 26 07/31/2020 0324   ACIDBASEDEF 2.0 07/31/2020 0324   O2SAT 63.1 08/02/2020 0322     Coagulation Profile: No results for input(s): INR, PROTIME in the last 168 hours.  Cardiac Enzymes: No results for input(s): CKTOTAL, CKMB, CKMBINDEX, TROPONINI in the last 168 hours.  HbA1C: Hgb A1c MFr Bld  Date/Time Value Ref Range Status  07/04/2020 06:28 PM 11.6 (H) 4.8 - 5.6 % Final    Comment:    (NOTE) Pre diabetes:          5.7%-6.4%  Diabetes:              >6.4%  Glycemic control for   <7.0% adults with diabetes   08/05/2019 03:09 AM 11.6 (H) 4.8 - 5.6 % Final    Comment:    (NOTE) Pre diabetes:          5.7%-6.4% Diabetes:              >6.4% Glycemic control for   <7.0% adults with diabetes     CBG: Recent Labs  Lab 08/01/20 1644 08/01/20 1942 08/01/20  2335 08/02/20 0319 08/02/20 0742  GLUCAP 124* 140* 164* 212* 143*    This patient is critically ill with multiple organ system failure; which, requires frequent high complexity decision making, assessment, support, evaluation, and titration of therapies. This was completed through the application of advanced monitoring technologies and extensive interpretation of multiple databases. During this encounter critical care time was devoted to patient care services described in this note for 50 minutes.  Danny Brood, MD [facility] 08/02/2020 8:49 AM

## 2020-08-03 ENCOUNTER — Inpatient Hospital Stay (HOSPITAL_COMMUNITY): Payer: No Typology Code available for payment source

## 2020-08-03 DIAGNOSIS — J9601 Acute respiratory failure with hypoxia: Secondary | ICD-10-CM

## 2020-08-03 DIAGNOSIS — I5023 Acute on chronic systolic (congestive) heart failure: Secondary | ICD-10-CM

## 2020-08-03 DIAGNOSIS — G9340 Encephalopathy, unspecified: Secondary | ICD-10-CM | POA: Diagnosis not present

## 2020-08-03 DIAGNOSIS — I25700 Atherosclerosis of coronary artery bypass graft(s), unspecified, with unstable angina pectoris: Secondary | ICD-10-CM | POA: Diagnosis not present

## 2020-08-03 DIAGNOSIS — K567 Ileus, unspecified: Secondary | ICD-10-CM

## 2020-08-03 DIAGNOSIS — R570 Cardiogenic shock: Secondary | ICD-10-CM | POA: Diagnosis not present

## 2020-08-03 LAB — GLUCOSE, CAPILLARY
Glucose-Capillary: 114 mg/dL — ABNORMAL HIGH (ref 70–99)
Glucose-Capillary: 116 mg/dL — ABNORMAL HIGH (ref 70–99)
Glucose-Capillary: 121 mg/dL — ABNORMAL HIGH (ref 70–99)
Glucose-Capillary: 127 mg/dL — ABNORMAL HIGH (ref 70–99)
Glucose-Capillary: 149 mg/dL — ABNORMAL HIGH (ref 70–99)
Glucose-Capillary: 159 mg/dL — ABNORMAL HIGH (ref 70–99)
Glucose-Capillary: 160 mg/dL — ABNORMAL HIGH (ref 70–99)

## 2020-08-03 LAB — CBC
HCT: 30.6 % — ABNORMAL LOW (ref 39.0–52.0)
Hemoglobin: 9.3 g/dL — ABNORMAL LOW (ref 13.0–17.0)
MCH: 28.7 pg (ref 26.0–34.0)
MCHC: 30.4 g/dL (ref 30.0–36.0)
MCV: 94.4 fL (ref 80.0–100.0)
Platelets: 143 10*3/uL — ABNORMAL LOW (ref 150–400)
RBC: 3.24 MIL/uL — ABNORMAL LOW (ref 4.22–5.81)
RDW: 17.3 % — ABNORMAL HIGH (ref 11.5–15.5)
WBC: 14.5 10*3/uL — ABNORMAL HIGH (ref 4.0–10.5)
nRBC: 1 % — ABNORMAL HIGH (ref 0.0–0.2)

## 2020-08-03 LAB — RENAL FUNCTION PANEL
Albumin: 2.3 g/dL — ABNORMAL LOW (ref 3.5–5.0)
Albumin: 2.2 g/dL — ABNORMAL LOW (ref 3.5–5.0)
Anion gap: 9 (ref 5–15)
Anion gap: 6 (ref 5–15)
BUN: 46 mg/dL — ABNORMAL HIGH (ref 8–23)
BUN: 40 mg/dL — ABNORMAL HIGH (ref 8–23)
CO2: 26 mmol/L (ref 22–32)
CO2: 27 mmol/L (ref 22–32)
Calcium: 7.9 mg/dL — ABNORMAL LOW (ref 8.9–10.3)
Calcium: 7.6 mg/dL — ABNORMAL LOW (ref 8.9–10.3)
Chloride: 101 mmol/L (ref 98–111)
Chloride: 103 mmol/L (ref 98–111)
Creatinine, Ser: 2 mg/dL — ABNORMAL HIGH (ref 0.61–1.24)
Creatinine, Ser: 2.01 mg/dL — ABNORMAL HIGH (ref 0.61–1.24)
GFR, Estimated: 36 mL/min — ABNORMAL LOW (ref >60)
GFR, Estimated: 35 mL/min — ABNORMAL LOW (ref >60)
Glucose, Bld: 163 mg/dL — ABNORMAL HIGH (ref 70–99)
Glucose, Bld: 173 mg/dL — ABNORMAL HIGH (ref 70–99)
Phosphorus: 2.7 mg/dL (ref 2.5–4.6)
Phosphorus: 4.2 mg/dL (ref 2.5–4.6)
Potassium: 4.6 mmol/L (ref 3.5–5.1)
Potassium: 4.9 mmol/L (ref 3.5–5.1)
Sodium: 136 mmol/L (ref 135–145)
Sodium: 136 mmol/L (ref 135–145)

## 2020-08-03 LAB — HEPARIN LEVEL (UNFRACTIONATED)
Heparin Unfractionated: 0.24 IU/mL — ABNORMAL LOW (ref 0.30–0.70)
Heparin Unfractionated: 0.17 IU/mL — ABNORMAL LOW (ref 0.30–0.70)

## 2020-08-03 LAB — ECHOCARDIOGRAM LIMITED
Height: 73 in
Weight: 4419.78 oz

## 2020-08-03 LAB — COOXEMETRY PANEL
Carboxyhemoglobin: 1.4 % (ref 0.5–1.5)
Methemoglobin: 0.8 % (ref 0.0–1.5)
O2 Saturation: 58.5 %
Total hemoglobin: 9.9 g/dL — ABNORMAL LOW (ref 12.0–16.0)

## 2020-08-03 LAB — LACTATE DEHYDROGENASE: LDH: 370 U/L — ABNORMAL HIGH (ref 98–192)

## 2020-08-03 LAB — MAGNESIUM: Magnesium: 2.5 mg/dL — ABNORMAL HIGH (ref 1.7–2.4)

## 2020-08-03 MED ORDER — AMIODARONE HCL IN DEXTROSE 360-4.14 MG/200ML-% IV SOLN
INTRAVENOUS | Status: AC
  Administered 2020-08-03: 150 mg/h
  Filled 2020-08-03: qty 200

## 2020-08-03 MED ORDER — SODIUM PHOSPHATES 45 MMOLE/15ML IV SOLN
30.0000 mmol | Freq: Once | INTRAVENOUS | Status: AC
  Administered 2020-08-03: 30 mmol via INTRAVENOUS
  Filled 2020-08-03: qty 10

## 2020-08-03 NOTE — Procedures (Signed)
Arterial Catheter Insertion Procedure Note  Dylan Traver  [id number]  November 21, 1950  Date:08/03/20  Time:11:28 PM    Provider Performing: Renee Pain    Procedure: Insertion of Arterial Line [id number]) with US guidance [id number])   Indication(s) Blood pressure monitoring and/or need for frequent ABGs  Consent Unable to obtain consent due to emergent nature of procedure.  Anesthesia None   Time Out Verified patient identification, verified procedure, site/side was marked, verified correct patient position, special equipment/implants available, medications/allergies/relevant history reviewed, required imaging and test results available.   Sterile Technique Maximal sterile technique including full sterile barrier drape, hand hygiene, sterile gown, sterile gloves, mask, hair covering, sterile ultrasound probe cover (if used).   Procedure Description Area of catheter insertion was cleaned with chlorhexidine and draped in sterile fashion. With real-time ultrasound guidance an arterial catheter was placed into the right radial artery.  Appropriate arterial tracings confirmed on monitor.     Complications/Tolerance None; patient tolerated the procedure well.   EBL Minimal   Specimen(s) None   Renee Pain, MD Board Certified by the ABIM, [place]

## 2020-08-03 NOTE — Progress Notes (Signed)
[place] for Heparin Indication: Impella 5.5  No Known Allergies  Patient Measurements: Height: '6\' 1"'$  (185.4 cm) Weight: 125.3 kg (276 lb 3.8 oz) IBW/kg (Calculated) : 79.9 Heparin Dosing Weight: 102.8 kg  Vital Signs: Temp: 98.9 F (37.2 C) (05/05 0339) Temp Source: Oral (05/05 0339) BP: 101/83 (05/05 0400) Pulse Rate: 91 (05/05 0700)  Labs: Recent Labs    08/01/20 0253 08/01/20 1646 08/02/20 0316 08/02/20 1600 08/02/20 1700 08/03/20 0337  HGB 8.4*  --  8.7*  --   --  9.3*  HCT 27.7*  --  28.9*  --   --  30.6*  PLT 128*  --  143*  --   --  143*  HEPARINUNFRC 0.16*  --  0.16*  --  0.25* 0.24*  CREATININE 2.89*   < > 2.75* 2.28*  --  2.00*   < > = values in this interval not displayed.    Estimated Creatinine Clearance: 49.1 mL/min (A) (by C-G formula based on SCr of 2 mg/dL (H)).   Assessment: 70 yo M presents with NSTEMI and multivessel CAD, now s/p CABG with Impella support. Pharmacy asked to manage systemic heparin.  Heparin level therapeutic at 0.24, LDH down, CBC stable.  Goal of Therapy:  Heparin level 0.2-0.3 units/ml Monitor platelets by anticoagulation protocol: Yes   Plan:  Continue heparin purge solution (50 units/ml) Continue IV heparin 1200 units/hr Check q12h heparin level for now   Arrie Senate, PharmD, BCPS, [facility] Clinical Pharmacist [phone] Please check AMION for all [facility] numbers 08/03/2020

## 2020-08-03 NOTE — Progress Notes (Addendum)
T[facility] DAILY ICU PROGRESS NOTE                   [address]            [place] [address]          [phone]   9 Days Post-Op Procedure(s) (LRB): EXPLORATION POST OPERATIVE OPEN HEART (N/A)  Total Length of Stay:  LOS: 14 days   Subjective  remains critically ill, sedated on vent  Objective: Vital signs in last 24 hours: Temp:  [96.98 F (36.1 C)-98.9 F (37.2 C)] 98.9 F (37.2 C) (05/05 0339) Pulse Rate:  [72-100] 72 (05/05 0738) Cardiac Rhythm: A-V Sequential paced (05/05 0400) Resp:  [12-22] 20 (05/05 0738) BP: (101-141)/(83-99) 106/87 (05/05 0738) SpO2:  [81 %-100 %] 99 % (05/05 0738) Arterial Line BP: (66-165)/(55-157) 124/75 (05/05 0630) FiO2 (%):  [40 %] 40 % (05/05 0738) Weight:  [125.3 kg] 125.3 kg (05/05 0400)  Filed Weights   08/01/20 0500 08/02/20 0415 08/03/20 0400  Weight: 129.5 kg 127.9 kg 125.3 kg    Weight change: -2.6 kg   Hemodynamic parameters for last 24 hours: CVP:  [10 mmHg-77 mmHg] 17 mmHg  Intake/Output from previous day: 05/04 0701 - 05/05 0700 In: 3419.5 [I.V.:1302; NG/GT:1378.6; IV Piggyback:459.8] Out: 9381 [Urine:180; Chest Tube:110]  Intake/Output this shift: Total I/O In: 61.8 [I.V.:50.5; Other:11.3] Out: 0   Vent Mode: PRVC FiO2 (%):  [40 %] 40 % Set Rate:  [20 bmp] 20 bmp Vt Set:  [630 mL] 630 mL PEEP:  [5 cmH20] 5 cmH20 Plateau Pressure:  [23 OFB51-02 cmH20] 25 cmH20    Current Meds: Scheduled Meds: . sodium chloride   Intravenous Once  . sodium chloride   Intravenous Once  . amiodarone  200 mg Per Tube BID  . artificial tears  1 application Both Eyes H8N  . aspirin EC  325 mg Oral Daily   Or  . aspirin  324 mg Per Tube Daily  . B-complex with vitamin C  1 tablet Per Tube Daily  . bisacodyl  10 mg Oral Daily   Or  . bisacodyl  10 mg Rectal Daily  . chlorhexidine gluconate (MEDLINE KIT)  15 mL Mouth Rinse BID  . Chlorhexidine Gluconate Cloth  6 each Topical Daily  . docusate  200 mg  Per Tube Daily  . feeding supplement (PROSource TF)  45 mL Per Tube QID  . insulin aspart  10 Units Subcutaneous Q4H  . insulin aspart  3-9 Units Subcutaneous Q4H  . insulin detemir  29 Units Subcutaneous Q12H  . mouth rinse  15 mL Mouth Rinse 10 times per day  . metoCLOPramide (REGLAN) injection  5 mg Intravenous Q8H  . neostigmine  0.25 mg Subcutaneous Q6H  . pantoprazole sodium  40 mg Per Tube Daily  . polyethylene glycol  17 g Per Tube Daily  . rosuvastatin  40 mg Per Tube Daily  . sodium chloride flush  10-40 mL Intracatheter Q12H  . sodium chloride flush  3 mL Intravenous Q12H   Continuous Infusions: .  prismasol BGK 4/2.5 500 mL/hr at 08/03/20 0001  .  prismasol BGK 4/2.5 300 mL/hr at 08/03/20 0330  . sodium chloride Stopped (08/01/20 1202)  . sodium chloride    . ceFEPime (MAXIPIME) IV Stopped (08/02/20 2135)  . dexmedetomidine (PRECEDEX) IV infusion 0.5 mcg/kg/hr (08/03/20 0800)  . dextrose    . feeding supplement (VITAL 1.5 CAL) 1,000 mL (08/02/20 1407)  . fentaNYL  infusion INTRAVENOUS 200 mcg/hr (08/03/20 0800)  . heparin 1,200 Units/hr (08/03/20 0800)  . impella catheter heparin 50 unit/mL in dextrose 5% 11.9 mL/hr at 07/31/20 0148  . lactated ringers    . norepinephrine (LEVOPHED) Adult infusion 3 mcg/min (08/03/20 0800)  . prismasol BGK 4/2.5 1,500 mL/hr at 08/03/20 0604   PRN Meds:.sodium chloride, Place/Maintain arterial line **AND** sodium chloride, acetaminophen, dextrose, fentaNYL, fentaNYL (SUBLIMAZE) injection, heparin, lactated ringers, metoprolol tartrate, ondansetron (ZOFRAN) IV, sodium chloride, sodium chloride flush, sodium chloride flush, traMADol  General appearance: sedated on vent  Neurologic: some non purposeful movement Heart: RRR Lungs: coarse Abdomen: + distended Extremities: anasarca Wound: incis without specific signs of infection  Lab Results: CBC: Recent Labs    08/02/20 0316 08/03/20 0337  WBC 8.4 14.5*  HGB 8.7* 9.3*  HCT 28.9*  30.6*  PLT 143* 143*   BMET:  Recent Labs    08/02/20 1600 08/03/20 0337  NA 138 136  K 4.4 4.6  CL 103 101  CO2 26 26  GLUCOSE 158* 163*  BUN 54* 46*  CREATININE 2.28* 2.00*  CALCIUM 7.7* 7.9*    CMET: Lab Results  Component Value Date   WBC 14.5 (H) 08/03/2020   HGB 9.3 (L) 08/03/2020   HCT 30.6 (L) 08/03/2020   PLT 143 (L) 08/03/2020   GLUCOSE 163 (H) 08/03/2020   CHOL 159 07/26/2020   TRIG 134 07/31/2020   HDL 35 (L) 07/22/2020   LDLCALC 86 07/08/2020   ALT 14 07/27/2020   AST 21 07/27/2020   NA 136 08/03/2020   K 4.6 08/03/2020   CL 101 08/03/2020   CREATININE 2.00 (H) 08/03/2020   BUN 46 (H) 08/03/2020   CO2 26 08/03/2020   INR 1.5 (H) 07/06/2020   HGBA1C 11.6 (H) 07/13/2020      PT/INR: No results for input(s): LABPROT, INR in the last 72 hours. Radiology: ECHOCARDIOGRAM LIMITED  Result Date: 08/02/2020    ECHOCARDIOGRAM LIMITED REPORT   Patient Name:   Danny Carpenter Date of Exam: 08/02/2020 Medical Rec #:  [id number]     Height:       73.0 in Accession #:    [id number]    Weight:       282.0 lb Date of Birth:  [birth year]    BSA:          2.490 m Patient Age:    70 years      BP:           141/89 mmHg Patient Gender: M             HR:           92 bpm. Exam Location:  Inpatient Procedure: Limited Echo Indications:    Impella positioning  History:        Patient has prior history of Echocardiogram examinations, most                 recent 07/31/2020. CHF and Cardiomyopathy, CAD, Prior CABG, PAD,                 Arrythmias:Atrial Fibrillation; Risk Factors:Diabetes.  Sonographer:    Dustin Flock Referring Phys: [place]  1. Limited echo for Impella positioning EF 20% no effusion Normal RV size Impella seems to be positioned too deep as far as 7 cm distal to aortic annulus with turbulance from outflow near aortic annulus. FINDINGS  Additional Comments: Limited echo for Impella positioning EF 20% no effusion Normal RV size Impella  seems to be  positioned too deep as far as 7 cm distal to aortic annulus with turbulance from outflow near aortic annulus. Jenkins Rouge MD Electronically signed by Jenkins Rouge MD Signature Date/Time: 08/02/2020/10:52:34 AM    Final      Assessment/Plan: S/P Procedure(s) (LRB): EXPLORATION POST OPERATIVE OPEN HEART (N/A)  1 afeb, atrial fib, off levo, Impella P7 , position adjusted yesterday- good flows, no alarms- AHF team managing, limited echo yesterday- EF 20%, Co-Ox 58 2 CVVH, renal management per nephrology, BUN/Creat trends improved 3 vent management per PCCM 4 leukocytosis, on maxepime- Sganz has been removed 5 H/H improved 6 thrombocytopenia is stable 7 BS adeq control 8 Mg++, K+ - normal 9 TF's restarted 10 consider head CT    Danny Giovanni PA-C 08/03/2020 8:04 AM   Agree with above. Remains non-purposeful neurologically Some ectopy today, restarted on amio gtt  Danny Carpenter

## 2020-08-03 NOTE — Progress Notes (Signed)
Patient ID: Danny Carpenter, male   DOB: February 07, 1951, 70 y.o.   MRN: [id number]     Advanced Heart Failure Rounding Note  PCP-Cardiologist: None   Subjective:    - 4/26 S/P CABG - 5/4 Swan removed with ongoing fevers, CVVH started  Remains intubated/sedated. Per nursing, when sedation weans he is agitated but does not follow commands.   Now off NE, MAP stable.  Underlying rhythm appears to be atrial fibrillation in 70s.   CVVH ongoing, weight down 5 lbs.  CVP 18.  Pulling 100 cc/hr net negative.   Afebrile now, remains on cefepime, cultures negative.   Impella P7 Flow 3.9-4 No alarms Waveforms ok  LDH 209 => 247 => 215 => 247=>329=>350 => 477 => 370 Position of Impella adjusted yesterday.    Objective:   Weight Range: 125.3 kg Body mass index is 36.44 kg/m.   Vital Signs:   Temp:  [96.98 F (36.1 C)-98.9 F (37.2 C)] 98.9 F (37.2 C) (05/05 0339) Pulse Rate:  [87-100] 91 (05/05 0700) Resp:  [12-22] 22 (05/05 0700) BP: (101-141)/(83-99) 101/83 (05/05 0400) SpO2:  [81 %-100 %] 99 % (05/05 0700) Arterial Line BP: (66-165)/(55-157) 124/75 (05/05 0630) FiO2 (%):  [40 %] 40 % (05/05 0452) Weight:  [125.3 kg] 125.3 kg (05/05 0400) Last BM Date: 07/31/20  Weight change: Filed Weights   08/01/20 0500 08/02/20 0415 08/03/20 0400  Weight: 129.5 kg 127.9 kg 125.3 kg    Intake/Output:   Intake/Output Summary (Last 24 hours) at 08/03/2020 0733 Last data filed at 08/03/2020 0700 Gross per 24 hour  Intake 3419.53 ml  Output 5899 ml  Net -2479.47 ml      Physical Exam   CVP 18 General: NAD, on vent.  Neck: Thick, JVP elevated, no thyromegaly or thyroid nodule.  Lungs: Decreased at bases.  CV: Nondisplaced PMI.  Heart regular S1/S2, no S3/S4, no murmur.  1+ edema to knees.   Abdomen: Soft, nontender, no hepatosplenomegaly, no distention.  Skin: Intact without lesions or rashes.  Neurologic: Sedated, not following commands.  Extremities: No clubbing or cyanosis.   HEENT: Normal.    Telemetry   A-V Paced 90s => atrial fibrillation 70s Personally reviewed   Labs    CBC Recent Labs    08/02/20 0316 08/03/20 0337  WBC 8.4 14.5*  HGB 8.7* 9.3*  HCT 28.9* 30.6*  MCV 94.1 94.4  PLT 143* 725*   Basic Metabolic Panel Recent Labs    08/02/20 0316 08/02/20 1600 08/03/20 0337  NA 135 138 136  K 4.7 4.4 4.6  CL 106 103 101  CO2 24 26 26   GLUCOSE 258* 158* 163*  BUN 62* 54* 46*  CREATININE 2.75* 2.28* 2.00*  CALCIUM 7.4* 7.7* 7.9*  MG 2.2  --  2.5*  PHOS 2.2* 3.6 2.7   Liver Function Tests Recent Labs    08/02/20 1600 08/03/20 0337  ALBUMIN 2.2* 2.3*   No results for input(s): LIPASE, AMYLASE in the last 72 hours. Cardiac Enzymes No results for input(s): CKTOTAL, CKMB, CKMBINDEX, TROPONINI in the last 72 hours.  BNP: BNP (last 3 results) Recent Labs    07/18/2020 1211  BNP 465.5*    ProBNP (last 3 results) No results for input(s): PROBNP in the last 8760 hours.   D-Dimer No results for input(s): DDIMER in the last 72 hours. Hemoglobin A1C No results for input(s): HGBA1C in the last 72 hours. Fasting Lipid Panel No results for input(s): CHOL, HDL, LDLCALC, TRIG, CHOLHDL, LDLDIRECT in the last  72 hours. Thyroid Function Tests No results for input(s): TSH, T4TOTAL, T3FREE, THYROIDAB in the last 72 hours.  Invalid input(s): FREET3  Other results:   Imaging    ECHOCARDIOGRAM LIMITED  Result Date: 08/02/2020    ECHOCARDIOGRAM LIMITED REPORT   Patient Name:   [facility] Danny Carpenter Date of Exam: 08/02/2020 Medical Rec #:  [id number]     Height:       73.0 in Accession #:    [id number]    Weight:       282.0 lb Date of Birth:  [birth year]    BSA:          2.490 m Patient Age:    19 years      BP:           141/89 mmHg Patient Gender: M             HR:           92 bpm. Exam Location:  Inpatient Procedure: Limited Echo Indications:    Impella positioning  History:        Patient has prior history of Echocardiogram examinations, most                  recent 07/31/2020. CHF and Cardiomyopathy, CAD, Prior CABG, PAD,                 Arrythmias:Atrial Fibrillation; Risk Factors:Diabetes.  Sonographer:    Dustin Flock Referring Phys: [place]  1. Limited echo for Impella positioning EF 20% no effusion Normal RV size Impella seems to be positioned too deep as far as 7 cm distal to aortic annulus with turbulance from outflow near aortic annulus. FINDINGS  Additional Comments: Limited echo for Impella positioning EF 20% no effusion Normal RV size Impella seems to be positioned too deep as far as 7 cm distal to aortic annulus with turbulance from outflow near aortic annulus. Danny Rouge MD Electronically signed by Danny Rouge MD Signature Date/Time: 08/02/2020/10:52:34 AM    Final    [place] EKG SITE RITE  Result Date: 08/02/2020 If Site Rite image not attached, placement could not be confirmed due to current cardiac rhythm.    Medications:     Scheduled Medications: . sodium chloride   Intravenous Once  . sodium chloride   Intravenous Once  . amiodarone  200 mg Per Tube BID  . artificial tears  1 application Both Eyes M0N  . aspirin EC  325 mg Oral Daily   Or  . aspirin  324 mg Per Tube Daily  . B-complex with vitamin C  1 tablet Per Tube Daily  . bisacodyl  10 mg Oral Daily   Or  . bisacodyl  10 mg Rectal Daily  . chlorhexidine gluconate (MEDLINE KIT)  15 mL Mouth Rinse BID  . Chlorhexidine Gluconate Cloth  6 each Topical Daily  . docusate  200 mg Per Tube Daily  . feeding supplement (PROSource TF)  45 mL Per Tube QID  . insulin aspart  10 Units Subcutaneous Q4H  . insulin aspart  3-9 Units Subcutaneous Q4H  . insulin detemir  29 Units Subcutaneous Q12H  . mouth rinse  15 mL Mouth Rinse 10 times per day  . metoCLOPramide (REGLAN) injection  5 mg Intravenous Q8H  . neostigmine  0.25 mg Subcutaneous Q6H  . pantoprazole sodium  40 mg Per Tube Daily  . polyethylene glycol  17 g Per Tube Daily  .  rosuvastatin  40 mg Per Tube  Daily  . sodium chloride flush  10-40 mL Intracatheter Q12H  . sodium chloride flush  3 mL Intravenous Q12H    Infusions: .  prismasol BGK 4/2.5 500 mL/hr at 08/03/20 0001  .  prismasol BGK 4/2.5 300 mL/hr at 08/03/20 0330  . sodium chloride Stopped (08/01/20 1202)  . sodium chloride    . ceFEPime (MAXIPIME) IV Stopped (08/02/20 2135)  . dexmedetomidine (PRECEDEX) IV infusion 0.4 mcg/kg/hr (08/03/20 0700)  . dextrose    . feeding supplement (VITAL 1.5 CAL) 1,000 mL (08/02/20 1407)  . fentaNYL infusion INTRAVENOUS 175 mcg/hr (08/03/20 0700)  . heparin 1,200 Units/hr (08/03/20 0700)  . impella catheter heparin 50 unit/mL in dextrose 5% 11.9 mL/hr at 07/31/20 0148  . lactated ringers    . norepinephrine (LEVOPHED) Adult infusion Stopped (08/03/20 0659)  . prismasol BGK 4/2.5 1,500 mL/hr at 08/03/20 0604    PRN Medications: sodium chloride, Place/Maintain arterial line **AND** sodium chloride, acetaminophen, dextrose, fentaNYL, fentaNYL (SUBLIMAZE) injection, heparin, lactated ringers, metoprolol tartrate, ondansetron (ZOFRAN) IV, sodium chloride, sodium chloride flush, sodium chloride flush, traMADol  Assessment/Plan   1. Cardiogenic shock: Ischemic cardiomyopathy, post-CABG on 4/26.  He has Impella 5.5 in place, at P7 with adequate flow and no alarms.  Limited echo 4/29 with EF< 20%, the RV appears normal in size with severe systolic dysfunction. Hemodynamically stable today off NE and on Impella P7.  LDH is lower today after Impella position adjusted yesterday. Co-ox 59%, CVP 18.  Marked volume overload still. Started CVVH on 5/3, pulling 100 cc/hr net negative.  - Continue Impella P7 while we are pulling fluid. Continue heparin gtt. Check position under echo again today.    - Continue to pull around 100 cc net negative via CVVH.   2. CAD: s/p CABG x 5 with LIMA-LAD, seq SVG-D1/ramus, seq SVG-PDA/PLV.   - ASA - Crestor 3. Anemia: Post-op bleeding, back to  OR with multiple products given on 4/26 post-CABG.  Hgb higher at 9.3. 4. Thrombocytopenia: Plts 90K -> 112->114->128->143->143  today, suspect low post-op/post-surgical bleeding and multiple blood products as well as sepsis.  Stable with Impella.  5. PAD: Extensive history.  6. AAA: Monitoring as outpatient.  7. Type 2 DM: Insulin.  Hgb A1c was 11.6, poor control.  8. Atrial fibrillation: Rate-controlled.  Continue per tube amiodarone for now.   9. Neuro: intubated and sedated, not following commands with sedation wean.  - ?Head CT, discuss with CCM.  10. ID: Now afebrile.  PCT 7.5 on 4/29.  Currently on cefepime.  - Continue cefepime per CCM.  11. Acute hypoxemic respiratory failure: Pulmonary edema, possible PNA.  On vent, unable to wean just yet with marked volume overload.  May need trach if cannot extubate soon.  - Per CCM.  12. AKI:  CVVH started with marked volume overload and worsening renal function.  Still making some urine.  Avoid hypotension and excessive CVVH rates to allow future renal recovery.  13. Ileus: Seems to be resolving, tube feeds restarted.   - Continue Reglan and neostigmine.   CRITICAL CARE Performed by: Loralie Champagne  Total critical care time: 40 minutes  Critical care time was exclusive of separately billable procedures and treating other patients.  Critical care was necessary to treat or prevent imminent or life-threatening deterioration.  Critical care was time spent personally by me on the following activities: development of treatment plan with patient and/or surrogate as well as nursing, discussions with consultants, evaluation of patient's response to treatment, examination of patient,  obtaining history from patient or surrogate, ordering and performing treatments and interventions, ordering and review of laboratory studies, ordering and review of radiographic studies, pulse oximetry and re-evaluation of patient's condition.   Loralie Champagne,   08/03/2020 7:33 AM

## 2020-08-03 NOTE — Procedures (Signed)
Admit: 07/01/2020 LOS: 61  69M progressive AKI after CABG with prolonged shock (cardiogenic and ? Vasodilatory/septic), failure to achieve target volume status despite aggressive diuretics.    Current CRRT Prescription: Start Date: 08/01/20 Catheter: L Fem Temp IJ CCM placed 5/3 BFR: 300 Pre Blood Pump: 500 4K DFR: 1500 4K Replacement Rate: 300 4K Goal UF: 171m net neg/h Anticoagulation: systemic heparin Clotting: infrequent  S: Stable on CRRT, Tolerating UF K 4.6, P 2.7  O: 05/04 0701 - 05/05 0700 In: 3419.5 [I.V.:1302; NG/GT:1378.6; IV Piggyback:459.8] Out: 51610[Urine:180; Chest Tube:110]  Filed Weights   08/01/20 0500 08/02/20 0415 08/03/20 0400  Weight: 129.5 kg 127.9 kg 125.3 kg    Recent Labs  Lab 08/02/20 0316 08/02/20 1600 08/03/20 0337  NA 135 138 136  K 4.7 4.4 4.6  CL 106 103 101  CO2 _0 GLUCOSE 258* 158* 163*  BUN 62* 54* 46*  CREATININE 2.75* 2.28* 2.00*  CALCIUM 7.4* 7.7* 7.9*  PHOS 2.2* 3.6 2.7   Recent Labs  Lab 08/01/20 0253 08/02/20 0316 08/03/20 0337  WBC 7.4 8.4 14.5*  HGB 8.4* 8.7* 9.3*  HCT 27.7* 28.9* 30.6*  MCV 92.6 94.1 94.4  PLT 128* 143* 143*    Scheduled Meds: . sodium chloride   Intravenous Once  . sodium chloride   Intravenous Once  . amiodarone  200 mg Per Tube BID  . artificial tears  1 application Both Eyes [id number] . aspirin EC  325 mg Oral Daily   Or  . aspirin  324 mg Per Tube Daily  . B-complex with vitamin C  1 tablet Per Tube Daily  . bisacodyl  10 mg Oral Daily   Or  . bisacodyl  10 mg Rectal Daily  . chlorhexidine gluconate (MEDLINE KIT)  15 mL Mouth Rinse BID  . Chlorhexidine Gluconate Cloth  6 each Topical Daily  . docusate  200 mg Per Tube Daily  . feeding supplement (PROSource TF)  45 mL Per Tube QID  . insulin aspart  10 Units Subcutaneous Q4H  . insulin aspart  3-9 Units Subcutaneous Q4H  . insulin detemir  29 Units Subcutaneous Q12H  . mouth rinse  15 mL Mouth Rinse 10 times per day  .  metoCLOPramide (REGLAN) injection  5 mg Intravenous Q8H  . neostigmine  0.25 mg Subcutaneous Q6H  . pantoprazole sodium  40 mg Per Tube Daily  . polyethylene glycol  17 g Per Tube Daily  . rosuvastatin  40 mg Per Tube Daily  . sodium chloride flush  10-40 mL Intracatheter Q12H  . sodium chloride flush  3 mL Intravenous Q12H   Continuous Infusions: .  prismasol BGK 4/2.5 500 mL/hr at 08/03/20 0001  .  prismasol BGK 4/2.5 300 mL/hr at 08/03/20 0330  . sodium chloride Stopped (08/01/20 1202)  . sodium chloride    . ceFEPime (MAXIPIME) IV Stopped (08/03/20 0940)  . dexmedetomidine (PRECEDEX) IV infusion 0.8 mcg/kg/hr (08/03/20 1000)  . dextrose    . feeding supplement (VITAL 1.5 CAL) 1,000 mL (08/03/20 0907)  . fentaNYL infusion INTRAVENOUS 200 mcg/hr (08/03/20 0958)  . heparin 1,200 Units/hr (08/03/20 1000)  . impella catheter heparin 50 unit/mL in dextrose 5% 11.9 mL/hr at 07/31/20 0148  . lactated ringers    . norepinephrine (LEVOPHED) Adult infusion 3 mcg/min (08/03/20 1000)  . prismasol BGK 4/2.5 1,500 mL/hr at 08/03/20 1014   PRN Meds:.sodium chloride, Place/Maintain arterial line **AND** sodium chloride, acetaminophen, dextrose, fentaNYL, fentaNYL (SUBLIMAZE) injection, heparin, lactated  ringers, metoprolol tartrate, ondansetron (ZOFRAN) IV, sodium chloride, sodium chloride flush, sodium chloride flush, traMADol  ABG    Component Value Date/Time   PHART 7.314 (L) 07/31/2020 0324   PCO2ART 48.4 (H) 07/31/2020 0324   PO2ART 69 (L) 07/31/2020 0324   HCO3 24.4 07/31/2020 0324   TCO2 26 07/31/2020 0324   ACIDBASEDEF 2.0 07/31/2020 0324   O2SAT 58.5 08/03/2020 0337    A/P  1. Dialysis dependent AKI, progressive, nonoliguric 2/2 cardiorenal syndrome probably some ATN 2. Acute systolic CHF / ICM; impella, off inotropes; on NE; massive vol overload 3. CAD s/p  5V CABG 4/26 4. Anemia, mild, per primary 5. DM2 6. PAD hx/o Fem Pop Bypass 7. Diuretic Resistance with lasix gtt and  metolazone 8. Fevers, resolved 9. VDRF 10. Hypophoshatemia on CRRT   Cont at current settings and UF goals  Repelte P again today    Rexene Agent, MD  [facility]

## 2020-08-03 NOTE — Progress Notes (Signed)
[place] for Heparin Indication: Impella 5.5  No Known Allergies  Patient Measurements: Height: '6\' 1"'$  (185.4 cm) Weight: 125.3 kg (276 lb 3.8 oz) IBW/kg (Calculated) : 79.9 Heparin Dosing Weight: 102.8 kg  Vital Signs: Temp: 98.4 F (36.9 C) (05/05 1627) Temp Source: Oral (05/05 1627) BP: 118/79 (05/05 1600) Pulse Rate: 64 (05/05 1600)  Labs: Recent Labs    08/01/20 0253 08/01/20 1646 08/02/20 0316 08/02/20 1600 08/02/20 1700 08/03/20 0337 08/03/20 1619  HGB 8.4*  --  8.7*  --   --  9.3*  --   HCT 27.7*  --  28.9*  --   --  30.6*  --   PLT 128*  --  143*  --   --  143*  --   HEPARINUNFRC 0.16*  --  0.16*  --  0.25* 0.24* 0.17*  CREATININE 2.89*   < > 2.75* 2.28*  --  2.00*  --    < > = values in this interval not displayed.    Estimated Creatinine Clearance: 49.1 mL/min (A) (by C-G formula based on SCr of 2 mg/dL (H)).   Assessment: 70 yo M presents with NSTEMI and multivessel CAD, now s/p CABG with Impella support. Pharmacy asked to manage systemic heparin.  Heparin level down below goal this evening at 0.17, LDH down, CBC stable this am.   Goal of Therapy:  Heparin level 0.2-0.3 units/ml Monitor platelets by anticoagulation protocol: Yes   Plan:  Continue heparin purge solution (50 units/ml) Increase IV heparin 1300 units/hr Check q12h heparin level for now   Erin Hearing PharmD., BCPS Clinical Pharmacist 08/03/2020 5:37 PM

## 2020-08-03 NOTE — Progress Notes (Signed)
Danny Carpenter, MRN:  [id number], DOB:  1951-03-26, LOS: 55 ADMISSION DATE:  07/09/2020, CONSULTATION DATE: 07/28/2018 REFERRING MD: Dr. Orvan Seen, CHIEF COMPLAINT: Ventilator dependent  History of Present Illness:  Danny Carpenter is a 70 year old male with past medical history significant for hypertension, hyperlipidemia, type 2 diabetes, CVA, AAA, left foot amputation s/p fem pop bypass and heart failure who presented to the emergency department 4/21 after undergoing outpatient stress test that revealed EF of 20% and acute ischemia.  Since admission patient has been evaluated by cardiology and cardiothoracic surgery and patient underwent TEE and left heart cath.  LHC/RHC revealed severe three-vessel coronary artery disease with moderately elevated left heart and pulmonary artery pressures.  At that time patient was placed on goal-directed medical therapy for acute systolic congestive heart failure and CTS surgery was consulted for CABG potential.  Patient underwent CABG x5 with Dr. Orvan Seen 4/26, post CABG CODE BLUE called due to low flow on Impella, CTS notified.  Patient underwent bedside exploratory postoperative open heart where Dr. Julien Girt reopened the chest incision and hematoma was removed, patient's hemodynamics improved upon opening sternotomy.  PCCM consulted morning of 4/28 due to difficulty weaning ventilator  Pertinent  Medical History  Hypertension, hyperlipidemia, type 2 diabetes, CVA, AAA, left foot amputation s/p fem pop bypass and heart failure   Significant Hospital Events: Including procedures, antibiotic start and stop dates in addition to other pertinent events   . 4/21 presented to the ED for evaluation of abnormal stress test . 4/22 left and right heart cath severe multivessel disease . 4/26 CABG x5 with hemodynamic instability post requiring brief CPR with eventual return back to the OR anastomosis bleeding was seen . 4/28 remains ventilator dependent, slowly weaning  PEEP . 5/1 remains on vent, diuresing, no ready to come off  . 5/3 started on CRRT. . 5/5 requiring sedation for vent   Interim History / Subjective:   Chest tubes with minimal output Requiring sedation for vent dyssynchrony   Objective   Blood pressure (!) 139/97, pulse 76, temperature 98.7 F (37.1 C), temperature source Oral, resp. rate 18, height '6\' 1"'$  (1.854 m), weight 125.3 kg, SpO2 100 %. CVP:  [10 mmHg-77 mmHg] 18 mmHg  Vent Mode: PRVC FiO2 (%):  [40 %] 40 % Set Rate:  [20 bmp] 20 bmp Vt Set:  [630 mL] 630 mL PEEP:  [5 cmH20] 5 cmH20 Plateau Pressure:  [24 cmH20-28 cmH20] 25 cmH20   Intake/Output Summary (Last 24 hours) at 08/03/2020 1056 Last data filed at 08/03/2020 1000 Gross per 24 hour  Intake 3449.44 ml  Output 5966 ml  Net -2516.56 ml   Filed Weights   08/01/20 0500 08/02/20 0415 08/03/20 0400  Weight: 129.5 kg 127.9 kg 125.3 kg    Examination: General: Critically ill appearing older adult M intubated sedated NAD HEENT: ETT secure Anicteric sclera Pink mmm Neuro: Lightly sedated. Does not follow commands. Weak withdrawal to painful stimuli  CV: impella p7. Cap refill < 3 sec. irreg rhythm.  PULM: Mechanically ventilated. Symmetrical chest expansion. Diminished basilar sounds GI: Soft round ndnt  GU: CRRT Extremities: Anasarca. No acute joint deformity. No clubbing or cyanosis. L metatarsal amputation Skin: Midline sternotomy incision well approximated, c/d/healing   Labs/imaging that I have personally reviewed    5/3 KUB shows possible colonic ileus  5/5 coox 58.5 BMP Cr 2. Mag 2.5. CBC WBC 14.5 hgb 9.3 plt 143  Resolved Hospital Problem list     Assessment & Plan:  Acute encephalopathy -probably metabolic P -Considering CT H but most likely this is metabolic   Acute hypoxic respiratory failure requiring intubation -on low vent settings but still requiring ETT with mentation P -likely trach -VAP, PAD -wean as able -- would try PSV instead  of incr sedation if dyssynchronous without causing hypoxia   CAD s/p CABG x 5 Cardiogenic shock AAA P -continues on impella P7 -NE -OP follow up for AAA  Acute renal failure Volume overload P -CRRT   Suspected colonic ileus -reglan, neostigmine -hold EN   Anemia Thrombocytopenia P -trend. No transfusion indication at present   DM2 P -SSI   Leukocytosis Recent fever  P - Cultures negative, will set 7day course of antibiotics. - trend CBC    Best practice   Diet:  Tube Feed  - on hold for ileus, started Reglan and neostigmine.  Pain/Anxiety/Delirium protocol (if indicated): Yes (RASS goal -1) - fentanyl prn, wean Precedex as tolerated VAP protocol (if indicated): Yes DVT prophylaxis: Systemic AC - Heparin in Impella purge. GI prophylaxis: PPI Glucose control:  SSI Yes - SSI + basal + q4  Central venous access:  Yes, and it is still needed, Arterial line:  Yes, and it is still needed -- may need to replace  Foley:  Yes, and it is no longer needed - Can remove now that on CRRT Mobility:  bed rest  PT consulted: N/A Last date of multidisciplinary goals of care discussion Per primary Code Status:  full code Disposition: ICU  Labs   CBC: Recent Labs  Lab 07/30/20 0331 07/30/20 0339 07/31/20 0324 07/31/20 0337 08/01/20 0253 08/02/20 0316 08/03/20 0337  WBC 7.5  --   --  8.5 7.4 8.4 14.5*  HGB 9.3*   < > 9.2* 8.7* 8.4* 8.7* 9.3*  HCT 29.5*   < > 27.0* 27.8* 27.7* 28.9* 30.6*  MCV 89.7  --   --  90.3 92.6 94.1 94.4  PLT 112*  --   --  114* 128* 143* 143*   < > = values in this interval not displayed.    Basic Metabolic Panel: Recent Labs  Lab 07/29/20 0358 07/29/20 0404 07/29/20 1522 07/30/20 0331 07/30/20 0339 07/30/20 0559 07/30/20 1655 07/31/20 0337 07/31/20 1640 08/01/20 0253 08/01/20 1646 08/02/20 0316 08/02/20 1600 08/03/20 0337  NA 137   < > 137  --    < > 136   < > 136   < > 137 137 135 138 136  K 4.2   < > 4.0  --    < > 3.4*   <  > 4.2   < > 4.0 4.4 4.7 4.4 4.6  CL 103  --  104  --   --  102   < > 103   < > 103 107 106 103 101  CO2 22  --  22  --   --  24   < > 24   < > '24 25 24 26 26  '$ GLUCOSE 199*  --  328*  --   --  333*   < > 375*   < > 337* 125* 258* 158* 163*  BUN 35*  --  41*  --   --  46*   < > 59*   < > 70* 77* 62* 54* 46*  CREATININE 1.82*  --  1.95*  --   --  2.07*   < > 2.48*   < > 2.89* 3.69* 2.75* 2.28* 2.00*  CALCIUM 7.3*  --  7.2*  --   --  7.4*   < > 7.4*   < > 7.4* 7.2* 7.4* 7.7* 7.9*  MG 2.6*  --  2.4 2.3  --   --   --   --   --   --   --  2.2  --  2.5*  PHOS 4.5  --  3.8 3.0  --  3.0  --  2.1*  --   --   --  2.2* 3.6 2.7   < > = values in this interval not displayed.   GFR: Estimated Creatinine Clearance: 49.1 mL/min (A) (by C-G formula based on SCr of 2 mg/dL (H)). Recent Labs  Lab 07/28/20 1116 07/28/20 1535 07/29/20 0358 07/30/20 0331 07/31/20 0337 08/01/20 0253 08/01/20 0819 08/02/20 0316 08/03/20 0337  PROCALCITON 4.06  --  7.50 5.06  --   --   --   --   --   WBC  --  6.4 5.4 7.5 8.5 7.4  --  8.4 14.5*  LATICACIDVEN  --  1.0 0.9  --   --   --  0.8  --   --     Liver Function Tests: Recent Labs  Lab 07/30/20 0559 07/31/20 0337 08/02/20 0316 08/02/20 1600 08/03/20 0337  ALBUMIN 2.5* 2.2* 2.1* 2.2* 2.3*   No results for input(s): LIPASE, AMYLASE in the last 168 hours. No results for input(s): AMMONIA in the last 168 hours.  ABG    Component Value Date/Time   PHART 7.314 (L) 07/31/2020 0324   PCO2ART 48.4 (H) 07/31/2020 0324   PO2ART 69 (L) 07/31/2020 0324   HCO3 24.4 07/31/2020 0324   TCO2 26 07/31/2020 0324   ACIDBASEDEF 2.0 07/31/2020 0324   O2SAT 58.5 08/03/2020 0337     Coagulation Profile: No results for input(s): INR, PROTIME in the last 168 hours.  Cardiac Enzymes: No results for input(s): CKTOTAL, CKMB, CKMBINDEX, TROPONINI in the last 168 hours.  HbA1C: Hgb A1c MFr Bld  Date/Time Value Ref Range Status  07/19/2020 06:28 PM 11.6 (H) 4.8 - 5.6 % Final     Comment:    (NOTE) Pre diabetes:          5.7%-6.4%  Diabetes:              >6.4%  Glycemic control for   <7.0% adults with diabetes   08/05/2019 03:09 AM 11.6 (H) 4.8 - 5.6 % Final    Comment:    (NOTE) Pre diabetes:          5.7%-6.4% Diabetes:              >6.4% Glycemic control for   <7.0% adults with diabetes     CBG: Recent Labs  Lab 08/02/20 1643 08/02/20 1958 08/02/20 2346 08/03/20 0335 08/03/20 0811  GLUCAP 165* 156* 157* 160* 149*    CRITICAL CARE Performed by: Cristal Generous   Total critical care time: 47 minutes  Critical care time was exclusive of separately billable procedures and treating other patients. Critical care was necessary to treat or prevent imminent or life-threatening deterioration.  Critical care was time spent personally by me on the following activities: development of treatment plan with patient and/or surrogate as well as nursing, discussions with consultants, evaluation of patient's response to treatment, examination of patient, obtaining history from patient or surrogate, ordering and performing treatments and interventions, ordering and review of laboratory studies, ordering and review of radiographic studies, pulse oximetry and re-evaluation of patient's condition.  Eliseo Gum  MSN, AGACNP-BC [place] for pager details  08/03/2020, 10:57 AM

## 2020-08-03 NOTE — Progress Notes (Signed)
EVENING ROUNDS NOTE :     [employer]       [place] [id number]             [phone]                 9 Days Post-Op Procedure(s) (LRB): EXPLORATION POST OPERATIVE OPEN HEART (N/A)   Total Length of Stay:  LOS: 14 days  Events:   Ectopy improved    BP 118/79 (BP Location: Right Arm)   Pulse 64   Temp 98.4 F (36.9 C) (Oral)   Resp (!) 23   Ht '6\' 1"'$  (1.854 m)   Wt 125.3 kg   SpO2 96%   BMI 36.44 kg/m   CVP:  [10 mmHg-26 mmHg] 17 mmHg  Vent Mode: PRVC FiO2 (%):  [40 %] 40 % Set Rate:  [20 bmp] 20 bmp Vt Set:  [630 mL] 630 mL PEEP:  [5 cmH20] 5 cmH20 Plateau Pressure:  [24 cmH20-29 cmH20] 26 cmH20  .  prismasol BGK 4/2.5 500 mL/hr at 08/03/20 1038  .  prismasol BGK 4/2.5 300 mL/hr at 08/03/20 0330  . sodium chloride Stopped (08/01/20 1202)  . sodium chloride    . ceFEPime (MAXIPIME) IV Stopped (08/03/20 0940)  . dexmedetomidine (PRECEDEX) IV infusion 0.7 mcg/kg/hr (08/03/20 1700)  . dextrose    . feeding supplement (VITAL 1.5 CAL) 1,000 mL (08/03/20 0907)  . fentaNYL infusion INTRAVENOUS 175 mcg/hr (08/03/20 1700)  . heparin 1,200 Units/hr (08/03/20 1700)  . impella catheter heparin 50 unit/mL in dextrose 5% 11.9 mL/hr at 07/31/20 0148  . lactated ringers    . norepinephrine (LEVOPHED) Adult infusion 2 mcg/min (08/03/20 1700)  . prismasol BGK 4/2.5 1,500 mL/hr at 08/03/20 1711  . sodium phosphate  Dextrose 5% IVPB 43 mL/hr at 08/03/20 1700    I/O last 3 completed shifts: In: 4692.8 [I.V.:1648.8; Other:424.5; NG/GT:2059.8; IV Piggyback:559.7] Out: [id number] [Urine:1205; TT:[id number]; Chest Tube:200]   CBC Latest Ref Rng & Units 08/03/2020 08/02/2020 08/01/2020  WBC 4.0 - 10.5 K/uL 14.5(H) 8.4 7.4  Hemoglobin 13.0 - 17.0 g/dL 9.3(L) 8.7(L) 8.4(L)  Hematocrit 39.0 - 52.0 % 30.6(L) 28.9(L) 27.7(L)  Platelets 150 - 400 K/uL 143(L) 143(L) 128(L)    BMP Latest Ref Rng & Units 08/03/2020 08/02/2020 08/02/2020  Glucose 70 - 99 mg/dL 163(H) 158(H) 258(H)  BUN  8 - 23 mg/dL 46(H) 54(H) 62(H)  Creatinine 0.61 - 1.24 mg/dL 2.00(H) 2.28(H) 2.75(H)  Sodium 135 - 145 mmol/L 136 138 135  Potassium 3.5 - 5.1 mmol/L 4.6 4.4 4.7  Chloride 98 - 111 mmol/L 101 103 106  CO2 22 - 32 mmol/L '26 26 24  '$ Calcium 8.9 - 10.3 mg/dL 7.9(L) 7.7(L) 7.4(L)    ABG    Component Value Date/Time   PHART 7.314 (L) 07/31/2020 0324   PCO2ART 48.4 (H) 07/31/2020 0324   PO2ART 69 (L) 07/31/2020 0324   HCO3 24.4 07/31/2020 0324   TCO2 26 07/31/2020 0324   ACIDBASEDEF 2.0 07/31/2020 0324   O2SAT 58.5 08/03/2020 [id number]       Melodie Bouillon, MD 08/03/2020 5:31 PM

## 2020-08-03 NOTE — Progress Notes (Signed)
  Echocardiogram 2D Echocardiogram has been performed.  Chae Oommen G Adriaan Maltese 08/03/2020, 5:09 PM

## 2020-08-04 ENCOUNTER — Inpatient Hospital Stay (HOSPITAL_COMMUNITY): Payer: No Typology Code available for payment source

## 2020-08-04 DIAGNOSIS — I639 Cerebral infarction, unspecified: Secondary | ICD-10-CM | POA: Diagnosis not present

## 2020-08-04 LAB — RENAL FUNCTION PANEL
Albumin: 2.2 g/dL — ABNORMAL LOW (ref 3.5–5.0)
Albumin: 2.1 g/dL — ABNORMAL LOW (ref 3.5–5.0)
Anion gap: 7 (ref 5–15)
Anion gap: 8 (ref 5–15)
BUN: 39 mg/dL — ABNORMAL HIGH (ref 8–23)
BUN: 42 mg/dL — ABNORMAL HIGH (ref 8–23)
CO2: 24 mmol/L (ref 22–32)
CO2: 24 mmol/L (ref 22–32)
Calcium: 7.7 mg/dL — ABNORMAL LOW (ref 8.9–10.3)
Calcium: 7.7 mg/dL — ABNORMAL LOW (ref 8.9–10.3)
Chloride: 104 mmol/L (ref 98–111)
Chloride: 104 mmol/L (ref 98–111)
Creatinine, Ser: 1.93 mg/dL — ABNORMAL HIGH (ref 0.61–1.24)
Creatinine, Ser: 2.19 mg/dL — ABNORMAL HIGH (ref 0.61–1.24)
GFR, Estimated: 37 mL/min — ABNORMAL LOW (ref >60)
GFR, Estimated: 32 mL/min — ABNORMAL LOW (ref >60)
Glucose, Bld: 158 mg/dL — ABNORMAL HIGH (ref 70–99)
Glucose, Bld: 55 mg/dL — ABNORMAL LOW (ref 70–99)
Phosphorus: 3.5 mg/dL (ref 2.5–4.6)
Phosphorus: 3.4 mg/dL (ref 2.5–4.6)
Potassium: 4.9 mmol/L (ref 3.5–5.1)
Potassium: 5 mmol/L (ref 3.5–5.1)
Sodium: 135 mmol/L (ref 135–145)
Sodium: 136 mmol/L (ref 135–145)

## 2020-08-04 LAB — POCT I-STAT 7, (LYTES, BLD GAS, ICA,H+H)
Acid-base deficit: 1 mmol/L (ref 0.0–2.0)
Acid-base deficit: 1 mmol/L (ref 0.0–2.0)
Acid-base deficit: 1 mmol/L (ref 0.0–2.0)
Bicarbonate: 25.4 mmol/L (ref 20.0–28.0)
Bicarbonate: 25.8 mmol/L (ref 20.0–28.0)
Bicarbonate: 26.1 mmol/L (ref 20.0–28.0)
Calcium, Ion: 1.08 mmol/L — ABNORMAL LOW (ref 1.15–1.40)
Calcium, Ion: 1.1 mmol/L — ABNORMAL LOW (ref 1.15–1.40)
Calcium, Ion: 1.11 mmol/L — ABNORMAL LOW (ref 1.15–1.40)
HCT: 27 % — ABNORMAL LOW (ref 39.0–52.0)
HCT: 30 % — ABNORMAL LOW (ref 39.0–52.0)
HCT: 33 % — ABNORMAL LOW (ref 39.0–52.0)
Hemoglobin: 10.2 g/dL — ABNORMAL LOW (ref 13.0–17.0)
Hemoglobin: 11.2 g/dL — ABNORMAL LOW (ref 13.0–17.0)
Hemoglobin: 9.2 g/dL — ABNORMAL LOW (ref 13.0–17.0)
O2 Saturation: 93 %
O2 Saturation: 98 %
O2 Saturation: 98 %
Patient temperature: 37
Patient temperature: 37.4
Patient temperature: 98.9
Potassium: 4.7 mmol/L (ref 3.5–5.1)
Potassium: 4.8 mmol/L (ref 3.5–5.1)
Potassium: 5 mmol/L (ref 3.5–5.1)
Sodium: 136 mmol/L (ref 135–145)
Sodium: 137 mmol/L (ref 135–145)
Sodium: 137 mmol/L (ref 135–145)
TCO2: 27 mmol/L (ref 22–32)
TCO2: 27 mmol/L (ref 22–32)
TCO2: 28 mmol/L (ref 22–32)
pCO2 arterial: 46.1 mmHg (ref 32.0–48.0)
pCO2 arterial: 52.3 mmHg — ABNORMAL HIGH (ref 32.0–48.0)
pCO2 arterial: 53.5 mmHg — ABNORMAL HIGH (ref 32.0–48.0)
pH, Arterial: 7.297 — ABNORMAL LOW (ref 7.350–7.450)
pH, Arterial: 7.303 — ABNORMAL LOW (ref 7.350–7.450)
pH, Arterial: 7.348 — ABNORMAL LOW (ref 7.350–7.450)
pO2, Arterial: 118 mmHg — ABNORMAL HIGH (ref 83.0–108.0)
pO2, Arterial: 121 mmHg — ABNORMAL HIGH (ref 83.0–108.0)
pO2, Arterial: 72 mmHg — ABNORMAL LOW (ref 83.0–108.0)

## 2020-08-04 LAB — CBC
HCT: 28.6 % — ABNORMAL LOW (ref 39.0–52.0)
Hemoglobin: 8.7 g/dL — ABNORMAL LOW (ref 13.0–17.0)
MCH: 28.3 pg (ref 26.0–34.0)
MCHC: 30.4 g/dL (ref 30.0–36.0)
MCV: 93.2 fL (ref 80.0–100.0)
Platelets: 140 10*3/uL — ABNORMAL LOW (ref 150–400)
RBC: 3.07 MIL/uL — ABNORMAL LOW (ref 4.22–5.81)
RDW: 17.2 % — ABNORMAL HIGH (ref 11.5–15.5)
WBC: 17.4 10*3/uL — ABNORMAL HIGH (ref 4.0–10.5)
nRBC: 0.6 % — ABNORMAL HIGH (ref 0.0–0.2)

## 2020-08-04 LAB — GLUCOSE, CAPILLARY
Glucose-Capillary: 151 mg/dL — ABNORMAL HIGH (ref 70–99)
Glucose-Capillary: 147 mg/dL — ABNORMAL HIGH (ref 70–99)
Glucose-Capillary: 155 mg/dL — ABNORMAL HIGH (ref 70–99)
Glucose-Capillary: 62 mg/dL — ABNORMAL LOW (ref 70–99)
Glucose-Capillary: 115 mg/dL — ABNORMAL HIGH (ref 70–99)
Glucose-Capillary: 82 mg/dL (ref 70–99)
Glucose-Capillary: 83 mg/dL (ref 70–99)
Glucose-Capillary: 82 mg/dL (ref 70–99)

## 2020-08-04 LAB — CBC WITH DIFFERENTIAL/PLATELET
Abs Immature Granulocytes: 1.3 10*3/uL — ABNORMAL HIGH (ref 0.00–0.07)
Basophils Absolute: 0.1 10*3/uL (ref 0.0–0.1)
Basophils Relative: 1 %
Eosinophils Absolute: 0.7 10*3/uL — ABNORMAL HIGH (ref 0.0–0.5)
Eosinophils Relative: 4 %
HCT: 29.8 % — ABNORMAL LOW (ref 39.0–52.0)
Hemoglobin: 9.2 g/dL — ABNORMAL LOW (ref 13.0–17.0)
Immature Granulocytes: 7 %
Lymphocytes Relative: 9 %
Lymphs Abs: 1.7 10*3/uL (ref 0.7–4.0)
MCH: 28.7 pg (ref 26.0–34.0)
MCHC: 30.9 g/dL (ref 30.0–36.0)
MCV: 92.8 fL (ref 80.0–100.0)
Monocytes Absolute: 1.2 10*3/uL — ABNORMAL HIGH (ref 0.1–1.0)
Monocytes Relative: 7 %
Neutro Abs: 13.2 10*3/uL — ABNORMAL HIGH (ref 1.7–7.7)
Neutrophils Relative %: 72 %
Platelets: 158 10*3/uL (ref 150–400)
RBC: 3.21 MIL/uL — ABNORMAL LOW (ref 4.22–5.81)
RDW: 17.2 % — ABNORMAL HIGH (ref 11.5–15.5)
WBC: 18.1 10*3/uL — ABNORMAL HIGH (ref 4.0–10.5)
nRBC: 0.7 % — ABNORMAL HIGH (ref 0.0–0.2)

## 2020-08-04 LAB — HEPARIN LEVEL (UNFRACTIONATED)
Heparin Unfractionated: 0.19 IU/mL — ABNORMAL LOW (ref 0.30–0.70)
Heparin Unfractionated: 0.26 IU/mL — ABNORMAL LOW (ref 0.30–0.70)

## 2020-08-04 LAB — COOXEMETRY PANEL
Carboxyhemoglobin: 1.2 % (ref 0.5–1.5)
Methemoglobin: 0.7 % (ref 0.0–1.5)
O2 Saturation: 62.8 %
Total hemoglobin: 13.9 g/dL (ref 12.0–16.0)

## 2020-08-04 LAB — MAGNESIUM: Magnesium: 2.4 mg/dL (ref 1.7–2.4)

## 2020-08-04 LAB — LACTATE DEHYDROGENASE: LDH: 366 U/L — ABNORMAL HIGH (ref 98–192)

## 2020-08-04 MED ORDER — DEXTROSE 50 % IV SOLN
INTRAVENOUS | Status: AC
  Administered 2020-08-04: 50 mL
  Filled 2020-08-04: qty 50

## 2020-08-04 MED ORDER — MIDODRINE HCL 5 MG PO TABS
5.0000 mg | ORAL_TABLET | Freq: Three times a day (TID) | ORAL | Status: DC
  Administered 2020-08-04 – 2020-08-06 (×8): 5 mg
  Filled 2020-08-04 (×8): qty 1

## 2020-08-04 MED ORDER — QUETIAPINE FUMARATE 25 MG PO TABS
25.0000 mg | ORAL_TABLET | Freq: Two times a day (BID) | ORAL | Status: DC
  Administered 2020-08-04 – 2020-08-09 (×12): 25 mg
  Filled 2020-08-04 (×12): qty 1

## 2020-08-04 MED ORDER — CLONAZEPAM 0.25 MG PO TBDP
0.2500 mg | ORAL_TABLET | Freq: Two times a day (BID) | ORAL | Status: DC
  Administered 2020-08-04 – 2020-08-09 (×12): 0.25 mg
  Filled 2020-08-04 (×12): qty 1

## 2020-08-04 MED ORDER — HYDROMORPHONE HCL 1 MG/ML PO LIQD
2.0000 mg | Freq: Four times a day (QID) | ORAL | Status: DC
Start: 2020-08-04 — End: 2020-08-10
  Administered 2020-08-04 – 2020-08-10 (×24): 2 mg
  Filled 2020-08-04 (×24): qty 2

## 2020-08-04 NOTE — Progress Notes (Signed)
[facility] BRIEF SICU PROGRESS NOTE  10 Days Post-Op  S/P Procedure(s) (LRB): EXPLORATION POST OPERATIVE OPEN HEART (N/A)   Stable day Results of head CT discussed w/ Dr Lynetta Mare Await formal Neurology consult  Plan: Continue current plan  Rexene Alberts, MD 08/04/2020 6:40 PM

## 2020-08-04 NOTE — Consult Note (Signed)
Neurology Consultation  Reason for Consult: [facility] with multiple acute to subacute posterior circulation infarcts within bilateral cerebellum, right middle cerebellar peduncle, left pins, and cortical/subcortical right occipital lobe.  Referring Physician: Dr. Orvan Seen  CC: [place] with multiple acute to subacute posterior circulation infarcts within bilateral cerebellum, right middle cerebellar peduncle, left pons, and cortical/subcortical right occipital lobe.  History is obtained from: Chart review, Patient unable to provide history due to intubated and sedated, no family at bedside  HPI: Danny Carpenter is a 70 y.o. male with a medical history significant for hypertension, hyperlipidemia, type 2 diabetes mellitus, peripheral vascular disease,OSA not on CPAP, CVA, AAA, left foot amputation s/o fem pop bypass, and heart failure who presented to the ED on 4/21 after an outpatient stress test identified an EF of 20% with acute ischemia. After a heart catheterization revealing severe three-vessel CAD, he underwent a CABG x 5 on 4/26. Following the procedure, a post CABG CODE BLUE was called for poor hemodynamics and low flow on the Impella requiring a bedside sternotomy and removal of a hematoma with subsequent improvement in hemodynamics.   Since his operation, he has had difficulty with being weaned off of the ventilator and becomes agitated without following commands when off of sedation. He also required CRRT initiation on 5/3 and 5/5 required sedation due to agitation and ventilator compliance. Due to prolonged encephalopathy and inability to wean the ventilator, a CTH was obtained on 5/6 revealing multiple posterior circulation infarcts within the bilateral cerebellum, right middle cerebellar peduncle, left pons, and cortical/subcortical right occipital lobe that are likely acute to subacute.  LKW: Unclear, intubated since 4/26 tpa given?: no, patient on heparin gtt, unclear last known well IR  Thrombectomy? No, unclear onset, no LVO identified on imaging Modified Rankin Scale: 2-Slight disability-UNABLE to perform all activities but does not need assistance  ROS: Unable to obtain due to altered mental status.   Past Medical History:  Diagnosis Date  . AAA (abdominal aortic aneurysm) without rupture ([place]) 06/11/2019   infrarenal   Last Assessment & Plan:  Uptodate.  Stable at 4.1cm Repeat visit to vascular surg in 12/2015.  Marland Kitchen Anxiety   . Arthritis   . Chronic fatigue 01/09/2015   Last Assessment & Plan:  Chronic fatigue. Suspect multifactorial including OSA, hyperglycemia (patient not taking insulin as prescribed), pain medications. No other constitutional sx.  Will plan for repeat lab work.   - follow up in 1 mth after sleep study results.  . Chronic pain 04/05/2013   Last Assessment & Plan:   Known Degenerative disc disease and bilateral peripheral neuropathic leg pain   continue cymbalta 12m  reports has trialed injections in past as well as  522mhydrocodone with no success. Also tried tramadol in the past with no benefit.  Prescribed Norco 7.5/325mg Hydrocodone/ Acetaminophen 1-1.5 tabs every 8 hours prn #150. 1 months through 04/17/14  UTox 06/14/14 app  . COPD (chronic obstructive pulmonary disease) ([facility]  . CVA (cerebral vascular accident) ([place]  . DDD (degenerative disc disease) 10/08/2004  . Depression   . Diabetic foot infection ([place]/18/2020  . Fracture of rib    Left  . GERD (gastroesophageal reflux disease)   . HTN (hypertension)   . Hypercholesteremia   . Hyperlipidemia 06/11/2019   On Lipitor 8022mer day  Last Assessment & Plan:  PLAN: HLD continue Lipitor [id number]  . Osteomyelitis (HCC)[place]/08/2019   left foot  . PAD (peripheral artery disease) (HCC)[place]/28/2020  . Peripheral  vascular disease ([place])   . PONV (postoperative nausea and vomiting)   . PVD (peripheral vascular disease) ([place])   . Seizure [facility])    one time in Oct. 2020  . Senile nuclear  sclerosis 12/06/2011  . Sleep apnea    no cpap  . Subacute osteomyelitis of left foot ([place])   . Type 2 diabetes mellitus with other specified complication [facility])    Past Surgical History:  Procedure Laterality Date  . ABDOMINAL AORTOGRAM W/LOWER EXTREMITY N/A 01/19/2019   Procedure: ABDOMINAL AORTOGRAM W/LOWER EXTREMITY;  Surgeon: Serafina Mitchell, MD;  Location: [place] CV LAB;  Service: Cardiovascular;  Laterality: N/A;  . ABDOMINAL AORTOGRAM W/LOWER EXTREMITY Left 06/17/2019   Procedure: ABDOMINAL AORTOGRAM W/LOWER EXTREMITY;  Surgeon: Marty Heck, MD;  Location: [place] CV LAB;  Service: Cardiovascular;  Laterality: Left;  . AMPUTATION Left 02/22/2019   Procedure: LEFT FOOT 5TH RAY AMPUTATION;  Surgeon: Newt Minion, MD;  Location: [place];  Service: Orthopedics;  Laterality: Left;  . APPENDECTOMY    . CORONARY ARTERY BYPASS GRAFT N/A 07/19/2020   Procedure: CORONARY ARTERY BYPASS GRAFTING (CABG) ON PUMP X FIVE  USING LEFT INTERNAL MAMMARY ARTERY AND RIGHT ENDOSCOPIC VEIN HARVEST CONDUITS, INSERTION OF INTRA AORTIC BALLOON PUMP IN RIGHT FEMORAL ARTERY,  RIGHT AXILLARY CANNULATION WITH 10MM HEMASHIELD GRAFT;  Surgeon: Wonda Olds, MD;  Location: [place];  Service: Open Heart Surgery;  Laterality: N/A;  . ENDARTERECTOMY FEMORAL Left 01/27/2019   Procedure: ENDARTERECTOMY LEFT FEMORAL ARTERY AND PROFUNDA  ARTERY;  Surgeon: Serafina Mitchell, MD;  Location: [place];  Service: Vascular;  Laterality: Left;  . EXPLORATION POST OPERATIVE OPEN HEART N/A 07/27/2020   Procedure: BEDSIDE EXPLORATION POST OPERATIVE OPEN HEART;  Surgeon: Wonda Olds, MD;  Location: [place];  Service: Open Heart Surgery;  Laterality: N/A;  . EXPLORATION POST OPERATIVE OPEN HEART N/A 07/16/2020   Procedure: EXPLORATION POST OPERATIVE OPEN HEART;  Surgeon: Wonda Olds, MD;  Location: [place];  Service: Open Heart Surgery;  Laterality: N/A;  . FEMORAL-POPLITEAL BYPASS GRAFT Left 01/27/2019   Procedure:  BYPASS GRAFT FEMORAL-POPLITEAL ARTERY;  Surgeon: Serafina Mitchell, MD;  Location: [place];  Service: Vascular;  Laterality: Left;  . FINGER AMPUTATION     Left second and third fingers, work accident  . INTRAVASCULAR PRESSURE WIRE/FFR STUDY N/A 07/03/2020   Procedure: INTRAVASCULAR PRESSURE WIRE/FFR STUDY;  Surgeon: Nelva Bush, MD;  Location: [place] CV LAB;  Service: Cardiovascular;  Laterality: N/A;  . KNEE SURGERY Left   . LOWER EXTREMITY ANGIOGRAM  05/2008   Left SFA atherectomy with diamondback 2.25 utilizing distal protection device NAV6 placed in the left popliteal artery. Angioplasty of the left SFA with ATV balloon 6 x 40   . PATCH ANGIOPLASTY Left 01/27/2019   Procedure: Patch Angioplasty using Vein;  Surgeon: Serafina Mitchell, MD;  Location: [place];  Service: Vascular;  Laterality: Left;  . PERIPHERAL VASCULAR BALLOON ANGIOPLASTY Left 06/17/2019   Procedure: PERIPHERAL VASCULAR BALLOON ANGIOPLASTY;  Surgeon: Marty Heck, MD;  Location: [place] CV LAB;  Service: Cardiovascular;  Laterality: Left;  left proximal and mid bypass  . RIGHT/LEFT HEART CATH AND CORONARY ANGIOGRAPHY N/A 07/04/2020   Procedure: RIGHT/LEFT HEART CATH AND CORONARY ANGIOGRAPHY;  Surgeon: Nelva Bush, MD;  Location: [place] CV LAB;  Service: Cardiovascular;  Laterality: N/A;  . SHOULDER SURGERY Right   . TEE WITHOUT CARDIOVERSION N/A 07/24/2020   Procedure: TRANSESOPHAGEAL ECHOCARDIOGRAM (TEE);  Surgeon: Wonda Olds,  MD;  [facility];  Service: Open Heart Surgery;  Laterality: N/A;  . TRANSMETATARSAL AMPUTATION Left 08/06/2019   Procedure: TRANSMETATARSAL AMPUTATION;  Surgeon: Landis Martins, DPM;  Location: [place];  Service: Podiatry;  Laterality: Left;  Available after 5pm   . VEIN HARVEST Left 01/27/2019   Procedure: Left Saphenous Vein Harvest;  Surgeon: Serafina Mitchell, MD;  [facility] OR;  Service: Vascular;  Laterality: Left;   Family History  Problem Relation Age of  Onset  . Cancer Mother   . Cancer Father    Social History:   reports that he quit smoking about 11 years ago. His smoking use included cigarettes. He has a 40.00 pack-year smoking history. He has never used smokeless tobacco. He reports previous alcohol use. He reports that he does not use drugs.  Medications  Current Facility-Administered Medications:  .   prismasol BGK 4/2.5 infusion, , CRRT, Continuous, Pearson Grippe B, MD, Last Rate: 500 mL/hr at 08/04/20 0548, New Bag at 08/04/20 0548 .   prismasol BGK 4/2.5 infusion, , CRRT, Continuous, Pearson Grippe B, MD, Last Rate: 300 mL/hr at 08/04/20 0746, New Bag at 08/04/20 0746 .  0.9 %  sodium chloride infusion (Manually program via Guardrails IV Fluids), , Intravenous, Once, Atkins, Broadus Z, MD .  0.9 %  sodium chloride infusion (Manually program via Guardrails IV Fluids), , Intravenous, Once, Atkins, Broadus Z, MD .  0.9 %  sodium chloride infusion, , Intravenous, PRN, Wonda Olds, MD, Stopped at 08/01/20 1202 .  Place/Maintain arterial line, , , Until Discontinued **AND** 0.9 %  sodium chloride infusion, , Intra-arterial, PRN, Atkins, Glenice Bow, MD .  acetaminophen (TYLENOL) tablet 650 mg, 650 mg, Oral, Q6H PRN, Agarwala, Ravi, MD .  amiodarone (PACERONE) tablet 200 mg, 200 mg, Per Tube, BID, Orvan Seen, Glenice Bow, MD, 200 mg at 08/04/20 0917 .  artificial tears (LACRILUBE) ophthalmic ointment 1 application, 1 application, Both Eyes, Q8H, Agarwala, Ravi, MD, 1 application at 80/99/83 1350 .  aspirin EC tablet 325 mg, 325 mg, Oral, Daily **OR** aspirin chewable tablet 324 mg, 324 mg, Per Tube, Daily, Elgie Collard, PA-C, 324 mg at 08/04/20 0916 .  B-complex with vitamin C tablet 1 tablet, 1 tablet, Per Tube, Daily, Kipp Brood, MD, 1 tablet at 08/04/20 0920 .  bisacodyl (DULCOLAX) EC tablet 10 mg, 10 mg, Oral, Daily, 10 mg at 08/04/20 0919 **OR** bisacodyl (DULCOLAX) suppository 10 mg, 10 mg, Rectal, Daily, Conte, Tessa N, PA-C, 10 mg  at 07/26/20 0916 .  chlorhexidine gluconate (MEDLINE KIT) (PERIDEX) 0.12 % solution 15 mL, 15 mL, Mouth Rinse, BID, Atkins, Glenice Bow, MD, 15 mL at 08/04/20 0744 .  Chlorhexidine Gluconate Cloth 2 % PADS 6 each, 6 each, Topical, Daily, Wonda Olds, MD, 6 each at 08/04/20 0919 .  clonazePAM (KLONOPIN) disintegrating tablet 0.25 mg, 0.25 mg, Per Tube, BID, Agarwala, Ravi, MD, 0.25 mg at 08/04/20 1123 .  dexmedetomidine (PRECEDEX) 400 MCG/100ML (4 mcg/mL) infusion, 0-1.2 mcg/kg/hr (Order-Specific), Intravenous, Continuous, Atkins, Glenice Bow, MD, Last Rate: 32.3 mL/hr at 08/04/20 1600, 1 mcg/kg/hr at 08/04/20 1600 .  dextrose 10 % infusion, , Intravenous, Continuous PRN, Agarwala, Ravi, MD .  docusate (COLACE) 50 MG/5ML liquid 200 mg, 200 mg, Per Tube, Daily, Einar Grad, RPH, 200 mg at 08/04/20 0917 .  feeding supplement (PROSource TF) liquid 45 mL, 45 mL, Per Tube, QID, Merlene Laughter F, NP, 45 mL at 08/04/20 1350 .  feeding supplement (VITAL 1.5 CAL) liquid 1,000 mL,  1,000 mL, Per Tube, Continuous, Merlene Laughter F, NP, Last Rate: 55 mL/hr at 08/03/20 2003, 1,000 mL at 08/03/20 2003 .  fentaNYL (SUBLIMAZE) bolus via infusion 25-100 mcg, 25-100 mcg, Intravenous, Q15 min PRN, Margaretmary Lombard, MD, 50 mcg at 08/03/20 1220 .  fentaNYL (SUBLIMAZE) injection 25-100 mcg, 25-100 mcg, Intravenous, Q1H PRN, Kipp Brood, MD, 100 mcg at 08/02/20 0915 .  fentaNYL 2527mg in NS 259m(1031mml) infusion-PREMIX, 25-200 mcg/hr, Intravenous, Continuous, Kamat, Sunil G, MD, Last Rate: 20 mL/hr at 08/04/20 1600, 200 mcg/hr at 08/04/20 1600 .  heparin ADULT infusion 100 units/mL (25000 units/250m30m1,350 Units/hr, Intravenous, Continuous, Atkins, BroaGlenice Bow, Last Rate: 13.5 mL/hr at 08/04/20 1600, 1,350 Units/hr at 08/04/20 1600 .  heparin Impella PURGE (50,000 units/1000 mL) 50 units/mL, , Intracatheter, Continuous, Atkins, BroaGlenice Bow, Last Rate: 11.9 mL/hr at 07/31/20 0148, New Bag at 08/03/20 2115 .   heparin injection 1,000-6,000 Units, 1,000-6,000 Units, CRRT, PRN, SanfRexene Agent, 3,000 Units at 08/04/20 1228 .  HYDROmorphone HCl (DILAUDID) liquid 2 mg, 2 mg, Per Tube, Q6H, Agarwala, Ravi, MD, 2 mg at 08/04/20 1156 .  insulin aspart (novoLOG) injection 10 Units, 10 Units, Subcutaneous, Q4H, AgarKipp Brood, 10 Units at 08/04/20 1132 .  insulin aspart (novoLOG) injection 3-9 Units, 3-9 Units, Subcutaneous, Q4H, Agarwala, Ravi, MD, 6 Units at 08/04/20 1133 .  insulin detemir (LEVEMIR) injection 29 Units, 29 Units, Subcutaneous, Q12H, AgarKipp Brood, 29 Units at 08/04/20 0913 .  lactated ringers infusion 500 mL, 500 mL, Intravenous, Once PRN, Conte, Tessa N, PA-C .  MEDLINE mouth rinse, 15 mL, Mouth Rinse, 10 times per day, Atkins, BroaGlenice Bow, 15 mL at 08/04/20 1625 .  metoCLOPramide (REGLAN) injection 5 mg, 5 mg, Intravenous, Q8H, McLeLarey Dresser, 5 mg at 08/04/20 1349 .  metoprolol tartrate (LOPRESSOR) injection 2.5-5 mg, 2.5-5 mg, Intravenous, Q2H PRN, Conte, Tessa N, PA-C .  midodrine (PROAMATINE) tablet 5 mg, 5 mg, Per Tube, TID WC, Clegg, Amy D, NP, 5 mg at 08/04/20 1123 .  norepinephrine (LEVOPHED) 16 mg in 250mL53mmix infusion, 0-40 mcg/min, Intravenous, Titrated, Atkins, BroadGlenice Bow Last Rate: 3.75 mL/hr at 08/04/20 1600, 4 mcg/min at 08/04/20 1600 .  ondansetron (ZOFRAN) injection 4 mg, 4 mg, Intravenous, Q6H PRN, ConteElgie CollardC, 4 mg at 07/29/20 0634 0300antoprazole sodium (PROTONIX) 40 mg/20 mL oral suspension 40 mg, 40 mg, Per Tube, Daily, BitonEinar Grad, 40 mg at 08/04/20 0917 9233olyethylene glycol (MIRALAX / GLYCOLAX) packet 17 g, 17 g, Per Tube, Daily, AtkinOrvan SeenadGlenice Bow 17 g at 08/04/20 0919 .  prismasol BGK 4/2.5 infusion, , CRRT, Continuous, SanfoPearson GrippeD, Last Rate: 1,500 mL/hr at 08/04/20 1102, New Bag at 08/04/20 1102 .  QUEtiapine (SEROQUEL) tablet 25 mg, 25 mg, Per Tube, BID, Agarwala, Ravi, MD, 25 mg at 08/04/20 1157 .   rosuvastatin (CRESTOR) tablet 40 mg, 40 mg, Per Tube, Daily, BitonEinar Grad, 40 mg at 08/04/20 0917 0076odium chloride 0.9 % primer fluid for CRRT, , CRRT, PRN, SanfoRexene Agent Given at 08/03/20 0039 [phone] chloride flush (NS) 0.9 % injection 10-40 mL, 10-40 mL, Intracatheter, Q12H, Atkins, BroadGlenice Bow 10 mL at 08/04/20 0921 .  sodium chloride flush (NS) 0.9 % injection 10-40 mL, 10-40 mL, Intracatheter, PRN, Atkins, Broadus Z, MD .  sodium chloride flush (NS) 0.9 % injection 3 mL, 3 mL, Intravenous, Q12H, Conte, Tessa N,  PA-C, 3 mL at 08/04/20 0921 .  sodium chloride flush (NS) 0.9 % injection 3 mL, 3 mL, Intravenous, PRN, Elgie Collard, PA-C, 3 mL at 07/29/20 2305 .  traMADol (ULTRAM) tablet 50-100 mg, 50-100 mg, Per Tube, Q4H PRN, Einar Grad, RPH, 100 mg at 08/03/20 7482  Exam: Current vital signs: BP 101/65   Pulse 67   Temp 99.1 F (37.3 C) (Oral)   Resp 20   Ht 6' 1"  (1.854 m)   Wt 125.3 kg   SpO2 100%   BMI 36.44 kg/m  Vital signs in last 24 hours: Temp:  [97.9 F (36.6 C)-99.5 F (37.5 C)] 99.1 F (37.3 C) (05/06 1200) Pulse Rate:  [49-95] 67 (05/06 1525) Resp:  [12-23] 20 (05/06 1525) BP: (83-178)/(59-166) 101/65 (05/06 1300) SpO2:  [89 %-100 %] 100 % (05/06 1525) Arterial Line BP: (83-304)/(55-301) 99/60 (05/06 1345) FiO2 (%):  [40 %] 40 % (05/06 1525)  GENERAL: Intubated and sedated in the ICU  Head: Normocephalic and atraumatic, dry mm Eyes: Corneal edema present bilaterally, slightly disconjugate gaze  ENT: NDT in nares with bridle, ETT midline mouth LUNGS: ETT in place with moderate clear oral and ETT secretions. Tachypnic on ventilator when off of sedation with spontaneous respirations over set ventilator rate. CV: Paced rate on cardiac monitor. Sternal incision well approximated without active hemorrhage. ABDOMEN: Soft, non-distended Ext: amputated toes on left foot, partially amputated fingers on the left hand, anasarca   NEURO:  Sedation paused for full neurologic examination Mental Status: Intubated and sedated. He does not follow commands. Unable to vocalize due to ETT in place. Does not open eyes to stimuli. Patient is unable to give a clear or coherent history of present illness.  Cranial Nerves:  II: PERRL 3 --> 2 mm/brisk. Does not fixate or track examiner. Does not blink to threat.  III, IV, VI: Oculocephalic and corneal reflexes intact. V: Unable to assess due to patient condition VII: Unable to assess facial movement due to patient condition  VIII:  Unable to assess due to patient condition IX, X: Cough and weak gag reflexes present XI:  Unable to assess due to patient condition XII:  Unable to assess due to patient condition Motor: Flaccid bilateral lower extremities and right upper extremity. Left upper extremity withdrawals with application of noxious stimuli. Tone and bulk are normal. Sensation:  Unable to assess due to patient condition; only withdrawals left upper extremity to noxious stimuli, no other movement throughout  Coordination:  Unable to assess due to patient condition.  DTRs: 2+ and symmetric bilateral patellae and biceps Plantars: Toes amputated on the left foot, right: mute Gait: deferred  Labs I have reviewed labs in epic and the results pertinent to this consultation are: CBC    Component Value Date/Time   WBC 18.1 (H) 08/04/2020 0800   RBC 3.21 (L) 08/04/2020 0800   HGB 10.2 (L) 08/04/2020 0850   HCT 30.0 (L) 08/04/2020 0850   PLT 158 08/04/2020 0800   MCV 92.8 08/04/2020 0800   MCH 28.7 08/04/2020 0800   MCHC 30.9 08/04/2020 0800   RDW 17.2 (H) 08/04/2020 0800   LYMPHSABS 1.7 08/04/2020 0800   MONOABS 1.2 (H) 08/04/2020 0800   EOSABS 0.7 (H) 08/04/2020 0800   BASOSABS 0.1 08/04/2020 0800   CMP     Component Value Date/Time   NA 137 08/04/2020 0850   K 4.7 08/04/2020 0850   CL 104 08/04/2020 0304   CO2 24 08/04/2020 0304   GLUCOSE 158 (  H) 08/04/2020 0304   BUN 39  (H) 08/04/2020 0304   CREATININE 1.93 (H) 08/04/2020 0304   CALCIUM 7.7 (L) 08/04/2020 0304   PROT 5.0 (L) 07/27/2020 0304   ALBUMIN 2.2 (L) 08/04/2020 0304   AST 21 07/27/2020 0304   ALT 14 07/27/2020 0304   ALKPHOS 38 07/27/2020 0304   BILITOT 1.1 07/27/2020 0304   GFRNONAA 37 (L) 08/04/2020 0304   GFRAA >60 08/08/2019 0352   Lipid Panel     Component Value Date/Time   CHOL 159 07/07/2020 0204   TRIG 134 07/31/2020 0338   HDL 35 (L) 07/19/2020 0204   CHOLHDL 4.5 07/04/2020 0204   VLDL 38 07/13/2020 0204   LDLCALC 86 07/04/2020 0204   Lab Results  Component Value Date   HGBA1C 11.6 (H) 07/18/2020   Imaging I have reviewed the images obtained:  CT-scan of the brain Multiple posterior circulation infarcts within the bilateral cerebellum, right middle cerebellar peduncle, left pons, and cortical/subcortical right occipital lobe. These infarcts are new as compared to the head CT of 03/22/2019 and likely acute/early subacute. No evidence of hemorrhagic conversion. No significant mass effect at this time. Chronic right basal ganglia lacunar infarct, new from the prior CT. Mild generalized parenchymal atrophy. Paranasal sinus disease and bilateral mastoid effusions in the presence of life-support tubes.  Assessment: 70 year old male with history as above who presented following stress test evidence of EF < 20% and with acute ischemia requiring a CABG x 5 for severe CAD identified on heart catheterization. Following procedure patient with multiple complications: low flow on Impella, hematoma impairing hemodynamics requiring bedside surgical exploration and prolonged encephalopathy and inability to wean from ventilator / requiring sedation to tolerate ETT.  - Neurologic examination findings are poor.  Brainstem reflexes are intact: cough, gag, corneal reflexes, pupil constriction, and spontaneous respirations over set ventilator rate; additionally, patient withdraws his left upper  extremity to noxious stimuli; all other extremities flaccid. Patient remains on sedation- Precedex and Fentanyl for ventilator tolerance.  - CT Head with evidence of multiple posterior circulation infarcts within the bilateral cerebellum, right middle cerebellar peduncle, left pons, and cortical/subcortical right occipital lobe. These infarcts are likely acute/subacute with a chronic right basal ganglia lacunar infarct. - Labs reveal a leukocytosis of 18.1 and an elevated creatinine of 7.7- patient on CRRT. - Patient has remained encephalopathic with prolonged ICU stay and multiple sedative infusions for periods of nonpurposeful agitation and ventilator tolerance.  - Encephalopathy likely multifactorial including prolonged ICU stay, sedation gtts, delirium, in addition to acute/subacute ischemic strokes. Also on the DDx is anoxic brain injury from [place] s/p CABG with hematoma and poor hemodynamics in the setting of compromised respiratory function and mild anemia.  Impression: Acute encephalopathy Acute hypoxic respiratory failure requiring mechanical ventilation Acute posterior circulation infarcts   Recommendations: - Recommend MRI brain when able for further evaluation of posterior infarctions for prognostication  - Wean sedation as much as possible for frequent neurologic examinations, as appropriate for cardiac status - Prophylaxis: patient currently on heparin gtt  Anibal Henderson, AGAC-NP [facility] Pager: [phone]  I have seen and examined the patient. I have discussed the assessment and recommendations with the Neurology NP and made amendations. 70 year old male with complicated PMHx who initially presented to the hospital following stress test evidence of EF < 20% and with acute ischemia requiring a CABG x 5 for severe CAD identified on heart catheterization. Following procedure patient with multiple complications: low flow on Impella, hematoma impairing  hemodynamics requiring bedside surgical exploration and prolonged encephalopathy and inability to wean from ventilator / requiring sedation to tolerate ETT. Neurology was called after CT showed multiple strokes of indeterminate age. Exam with preserved brainstem reflexes, flaccid bilateral lower extremities and right upper extremity, left upper extremity withdraws with application of noxious stimuli. Encephalopathy likely multifactorial, with multifocal strokes and possible anoxic brain injury on the DDx. Recommendations as above.  Electronically signed: Dr. Kerney Elbe

## 2020-08-04 NOTE — Progress Notes (Addendum)
TCTS DAILY ICU PROGRESS NOTE                   [address]            [place] [address]          [phone]   10 Days Post-Op Procedure(s) (LRB): EXPLORATION POST OPERATIVE OPEN HEART (N/A)  Total Length of Stay:  LOS: 15 days   Subjective: For poss Head CT today  Objective: Vital signs in last 24 hours: Temp:  [97.6 F (36.4 C)-99.5 F (37.5 C)] 99.5 F (37.5 C) (05/06 0800) Pulse Rate:  [59-95] 74 (05/06 0945) Cardiac Rhythm: Atrial fibrillation (05/06 0400) Resp:  [12-23] 20 (05/06 1000) BP: (83-178)/(59-166) 113/67 (05/06 1000) SpO2:  [86 %-100 %] 96 % (05/06 0945) Arterial Line BP: (76-304)/(55-301) 106/57 (05/06 1000) FiO2 (%):  [40 %] 40 % (05/06 0800)  Filed Weights   08/01/20 0500 08/02/20 0415 08/03/20 0400  Weight: 129.5 kg 127.9 kg 125.3 kg    Weight change:    Hemodynamic parameters for last 24 hours: CVP:  [15 mmHg-87 mmHg] 20 mmHg  Intake/Output from previous day: 05/05 0701 - 05/06 0700 In: 3524.4 [I.V.:1395.9; NG/GT:1405; IV Piggyback:460.1] Out: 6387 [Urine:1030; Chest Tube:30]  Intake/Output this shift: Total I/O In: 455.2 [I.V.:195.3; Other:34.9; NG/GT:225] Out: 660 [Other:660]  Current Meds: Scheduled Meds: . sodium chloride   Intravenous Once  . sodium chloride   Intravenous Once  . amiodarone  200 mg Per Tube BID  . artificial tears  1 application Both Eyes F6E  . aspirin EC  325 mg Oral Daily   Or  . aspirin  324 mg Per Tube Daily  . B-complex with vitamin C  1 tablet Per Tube Daily  . bisacodyl  10 mg Oral Daily   Or  . bisacodyl  10 mg Rectal Daily  . chlorhexidine gluconate (MEDLINE KIT)  15 mL Mouth Rinse BID  . Chlorhexidine Gluconate Cloth  6 each Topical Daily  . clonazepam  0.25 mg Per Tube BID  . docusate  200 mg Per Tube Daily  . feeding supplement (PROSource TF)  45 mL Per Tube QID  . HYDROmorphone HCl  2 mg Per Tube Q6H  . insulin aspart  10 Units Subcutaneous Q4H  . insulin aspart  3-9 Units  Subcutaneous Q4H  . insulin detemir  29 Units Subcutaneous Q12H  . mouth rinse  15 mL Mouth Rinse 10 times per day  . metoCLOPramide (REGLAN) injection  5 mg Intravenous Q8H  . midodrine  5 mg Per Tube TID WC  . pantoprazole sodium  40 mg Per Tube Daily  . polyethylene glycol  17 g Per Tube Daily  . QUEtiapine  25 mg Per Tube BID  . rosuvastatin  40 mg Per Tube Daily  . sodium chloride flush  10-40 mL Intracatheter Q12H  . sodium chloride flush  3 mL Intravenous Q12H   Continuous Infusions: .  prismasol BGK 4/2.5 500 mL/hr at 08/04/20 0548  .  prismasol BGK 4/2.5 300 mL/hr at 08/04/20 0746  . sodium chloride Stopped (08/01/20 1202)  . sodium chloride    . dexmedetomidine (PRECEDEX) IV infusion 1.1 mcg/kg/hr (08/04/20 1000)  . dextrose    . feeding supplement (VITAL 1.5 CAL) 1,000 mL (08/03/20 2003)  . fentaNYL infusion INTRAVENOUS 200 mcg/hr (08/04/20 1000)  . heparin 1,350 Units/hr (08/04/20 1000)  . impella catheter heparin 50 unit/mL in dextrose 5% 11.9 mL/hr at 07/31/20 0148  . lactated ringers    .  norepinephrine (LEVOPHED) Adult infusion 1.5 mcg/min (08/04/20 1000)  . prismasol BGK 4/2.5 1,500 mL/hr at 08/04/20 0746   PRN Meds:.sodium chloride, Place/Maintain arterial line **AND** sodium chloride, acetaminophen, dextrose, fentaNYL, fentaNYL (SUBLIMAZE) injection, heparin, lactated ringers, metoprolol tartrate, ondansetron (ZOFRAN) IV, sodium chloride, sodium chloride flush, sodium chloride flush, traMADol  General appearance: sedated on vent Heart: slightly irregular Lungs: coarse Abdomen: + distension Extremities: edema is improved Wound: incis with drainage or erethema  Lab Results: CBC: Recent Labs    08/04/20 0304 08/04/20 0351 08/04/20 0800  WBC 17.4*  --  18.1*  HGB 8.7* 9.2* 9.2*  HCT 28.6* 27.0* 29.8*  PLT 140*  --  158   BMET:  Recent Labs    08/03/20 1619 08/04/20 0004 08/04/20 0304 08/04/20 0351  NA 136   < > 135 137  K 4.9   < > 4.9 5.0  CL  103  --  104  --   CO2 27  --  24  --   GLUCOSE 173*  --  158*  --   BUN 40*  --  39*  --   CREATININE 2.01*  --  1.93*  --   CALCIUM 7.6*  --  7.7*  --    < > = values in this interval not displayed.    CMET: Lab Results  Component Value Date   WBC 18.1 (H) 08/04/2020   HGB 9.2 (L) 08/04/2020   HCT 29.8 (L) 08/04/2020   PLT 158 08/04/2020   GLUCOSE 158 (H) 08/04/2020   CHOL 159 07/11/2020   TRIG 134 07/31/2020   HDL 35 (L) 07/03/2020   LDLCALC 86 06/30/2020   ALT 14 07/27/2020   AST 21 07/27/2020   NA 137 08/04/2020   K 5.0 08/04/2020   CL 104 08/04/2020   CREATININE 1.93 (H) 08/04/2020   BUN 39 (H) 08/04/2020   CO2 24 08/04/2020   INR 1.5 (H) 07/06/2020   HGBA1C 11.6 (H) 07/12/2020      PT/INR: No results for input(s): LABPROT, INR in the last 72 hours. Radiology: ECHOCARDIOGRAM LIMITED  Result Date: 08/03/2020    ECHOCARDIOGRAM LIMITED REPORT   Patient Name:   Danny Carpenter Date of Exam: 08/03/2020 Medical Rec #:  [id number]     Height:       73.0 in Accession #:    [id number]    Weight:       276.2 lb Date of Birth:  1950/04/06    BSA:          2.468 m Patient Age:    70 years      BP:           118/79 mmHg Patient Gender: M             HR:           73 bpm. Exam Location:  Inpatient Procedure: Limited Echo and Color Doppler Indications:    I50.23 Acute on chronic systolic (congestive) heart failure  History:        Patient has prior history of Echocardiogram examinations, most                 recent 08/02/2020. COPD, Stroke and PAD; Risk                 Factors:Hypertension, Diabetes, Dyslipidemia and Sleep Apnea.                 Seizures. AAA. GERD.  Sonographer:    Jonelle Sidle Dance Referring Phys: [id number]  [place]  1. Left ventricular ejection fraction, by estimation, is <20%. The left ventricle has severely decreased function. Limited echo for Impella positioning. Compared to prior study support device has been pulled out of the left ventircle. Measured at 4.75 cm  from the annulus. FINDINGS  Left Ventricle: Left ventricular ejection fraction, by estimation, is <20%. The left ventricle has severely decreased function. Rudean Haskell MD Electronically signed by Rudean Haskell MD Signature Date/Time: 08/03/2020/5:58:54 PM    Final      Assessment/Plan: S/P Procedure(s) (LRB): EXPLORATION POST OPERATIVE OPEN HEART (N/A)  1 Tmax 99.4, leukocytosis conts to trend higher, mild left shift, cont current abx for poss PNA 2 Impella P7 with good flows no alarms- AHF conts to mangage cardiac/HF issues. In afib 3 nephrology conts with CVVH management- down 4 lbs, making some urine 4 may need trach if unable to extubate soon, PCCM managing 5 getting TF's 6 H/H stable 7 thrombocytopenia resolved 8 sugars adeq controlled 9 poss head CT - hopefully hasn't an event 10 prognosis remains guarded     Danny Giovanni PA-C 08/04/2020 10:49 AM   Agree with above CT shows multiple acute infarcts Neurology on board Continue support for now.  Danny Carpenter

## 2020-08-04 NOTE — Progress Notes (Signed)
[facility] for Heparin Indication: Impella 5.5  No Known Allergies  Patient Measurements: Height: '6\' 1"'$  (185.4 cm) Weight: 125.3 kg (276 lb 3.8 oz) IBW/kg (Calculated) : 79.9 Heparin Dosing Weight: 102.8 kg  Vital Signs: Temp: 100.1 F (37.8 C) (05/06 1600) Temp Source: Oral (05/06 1600) BP: 94/82 (05/06 1800) Pulse Rate: 75 (05/06 1845)  Labs: Recent Labs    08/03/20 0337 08/03/20 1619 08/04/20 0004 08/04/20 0304 08/04/20 0351 08/04/20 0800 08/04/20 0850 08/04/20 1630 08/04/20 1815  HGB 9.3*  --    < > 8.7* 9.2* 9.2* 10.2*  --   --   HCT 30.6*  --    < > 28.6* 27.0* 29.8* 30.0*  --   --   PLT 143*  --   --  140*  --  158  --   --   --   HEPARINUNFRC 0.24* 0.17*  --  0.19*  --   --   --   --  0.26*  CREATININE 2.00* 2.01*  --  1.93*  --   --   --  2.19*  --    < > = values in this interval not displayed.    Estimated Creatinine Clearance: 44.8 mL/min (A) (by C-G formula based on SCr of 2.19 mg/dL (H)).   Assessment: 70 yo M presents with NSTEMI and multivessel CAD, now s/p CABG with Impella support. Pharmacy asked to manage systemic heparin.  Heparin level is now within range at 0.26, on 1350 units/hr. Hgb 9.2, plt 140. LDH 366. Purge solution is running at 11.2 mL/hr (560 units/hr) - pressures in 400-500s, on P7.    Goal of Therapy:  Heparin level 0.2-0.3 units/ml Monitor platelets by anticoagulation protocol: Yes   Plan:  Continue heparin purge solution (50 units/ml) IV heparin at 1350 units/hr Check q12h heparin level for now  Erin Hearing PharmD., BCPS Clinical Pharmacist 08/04/2020 7:09 PM  Please check AMION for all [place] phone numbers After 10:00 PM, call [place] [phone]

## 2020-08-04 NOTE — Plan of Care (Signed)
  Problem: Coping: Goal: Level of anxiety will decrease Outcome: Progressing   Problem: Pain Managment: Goal: General experience of comfort will improve Outcome: Progressing   Problem: Safety: Goal: Ability to remain free from injury will improve Outcome: Progressing   

## 2020-08-04 NOTE — Procedures (Signed)
Admit: 07/11/2020 LOS: 15  82M progressive AKI after CABG with prolonged shock (cardiogenic and ? Vasodilatory/septic), failure to achieve target volume status despite aggressive diuretics.    Current CRRT Prescription: Start Date: 08/01/20 Catheter: L Fem Temp IJ CCM placed 5/3 BFR: 300 Pre Blood Pump: 500 4K DFR: 1500 4K Replacement Rate: 300 4K Goal UF: 161m net neg/h Anticoagulation: systemic heparin Clotting: infrequent  S: Stable on CRRT, Tolerating UF K 4.9, P 3.5  O: 05/05 0701 - 05/06 0700 In: 3524.4 [I.V.:1395.9; NG/GT:1405; IV Piggyback:460.1] Out: 64196[Urine:1030; Chest Tube:30]  Filed Weights   08/01/20 0500 08/02/20 0415 08/03/20 0400  Weight: 129.5 kg 127.9 kg 125.3 kg    Recent Labs  Lab 08/03/20 0337 08/03/20 1619 08/04/20 0004 08/04/20 0304 08/04/20 0351  NA 136 136 136 135 137  K 4.6 4.9 4.8 4.9 5.0  CL 101 103  --  104  --   CO2 26 27  --  24  --   GLUCOSE 163* 173*  --  158*  --   BUN 46* 40*  --  39*  --   CREATININE 2.00* 2.01*  --  1.93*  --   CALCIUM 7.9* 7.6*  --  7.7*  --   PHOS 2.7 4.2  --  3.5  --    Recent Labs  Lab 08/03/20 0337 08/04/20 0004 08/04/20 0304 08/04/20 0351 08/04/20 0800  WBC 14.5*  --  17.4*  --  18.1*  NEUTROABS  --   --   --   --  13.2*  HGB 9.3*   < > 8.7* 9.2* 9.2*  HCT 30.6*   < > 28.6* 27.0* 29.8*  MCV 94.4  --  93.2  --  92.8  PLT 143*  --  140*  --  158   < > = values in this interval not displayed.    Scheduled Meds: . sodium chloride   Intravenous Once  . sodium chloride   Intravenous Once  . amiodarone  200 mg Per Tube BID  . artificial tears  1 application Both Eyes QQ2W . aspirin EC  325 mg Oral Daily   Or  . aspirin  324 mg Per Tube Daily  . B-complex with vitamin C  1 tablet Per Tube Daily  . bisacodyl  10 mg Oral Daily   Or  . bisacodyl  10 mg Rectal Daily  . chlorhexidine gluconate (MEDLINE KIT)  15 mL Mouth Rinse BID  . Chlorhexidine Gluconate Cloth  6 each Topical Daily  .  clonazepam  0.25 mg Per Tube BID  . docusate  200 mg Per Tube Daily  . feeding supplement (PROSource TF)  45 mL Per Tube QID  . HYDROmorphone HCl  2 mg Per Tube Q6H  . insulin aspart  10 Units Subcutaneous Q4H  . insulin aspart  3-9 Units Subcutaneous Q4H  . insulin detemir  29 Units Subcutaneous Q12H  . mouth rinse  15 mL Mouth Rinse 10 times per day  . metoCLOPramide (REGLAN) injection  5 mg Intravenous Q8H  . midodrine  5 mg Per Tube TID WC  . pantoprazole sodium  40 mg Per Tube Daily  . polyethylene glycol  17 g Per Tube Daily  . QUEtiapine  25 mg Per Tube BID  . rosuvastatin  40 mg Per Tube Daily  . sodium chloride flush  10-40 mL Intracatheter Q12H  . sodium chloride flush  3 mL Intravenous Q12H   Continuous Infusions: .  prismasol BGK 4/2.5 500  mL/hr at 08/04/20 0548  .  prismasol BGK 4/2.5 300 mL/hr at 08/04/20 0746  . sodium chloride Stopped (08/01/20 1202)  . sodium chloride    . dexmedetomidine (PRECEDEX) IV infusion 1 mcg/kg/hr (08/04/20 1102)  . dextrose    . feeding supplement (VITAL 1.5 CAL) 1,000 mL (08/03/20 2003)  . fentaNYL infusion INTRAVENOUS 200 mcg/hr (08/04/20 1100)  . heparin 1,350 Units/hr (08/04/20 1100)  . impella catheter heparin 50 unit/mL in dextrose 5% 11.9 mL/hr at 07/31/20 0148  . lactated ringers    . norepinephrine (LEVOPHED) Adult infusion 1.5 mcg/min (08/04/20 1100)  . prismasol BGK 4/2.5 1,500 mL/hr at 08/04/20 1102   PRN Meds:.sodium chloride, Place/Maintain arterial line **AND** sodium chloride, acetaminophen, dextrose, fentaNYL, fentaNYL (SUBLIMAZE) injection, heparin, lactated ringers, metoprolol tartrate, ondansetron (ZOFRAN) IV, sodium chloride, sodium chloride flush, sodium chloride flush, traMADol  ABG    Component Value Date/Time   PHART 7.303 (L) 08/04/2020 0351   PCO2ART 52.3 (H) 08/04/2020 0351   PO2ART 121 (H) 08/04/2020 0351   HCO3 25.8 08/04/2020 0351   TCO2 27 08/04/2020 0351   ACIDBASEDEF 1.0 08/04/2020 0351   O2SAT  62.8 08/04/2020 0800    A/P  1. Dialysis dependent AKI, progressive, nonoliguric 2/2 cardiorenal syndrome probably some ATN 2. Acute systolic CHF / ICM; impella, off inotropes; on NE; massive vol overload; slowly improving 3. CAD s/p  5V CABG 4/26 4. Anemia, mild, per primary 5. DM2 6. PAD hx/o Fem Pop Bypass 7. Diuretic Resistance despite lasix gtt and metolazone 8. Fevers,  9. VDRF 10. Hypophoshatemia on CRRT, stable   Cont at current settings and UF goals; tolerating well, achieving UF  Rexene Agent, MD  [facility]

## 2020-08-04 NOTE — Progress Notes (Addendum)
Patient ID: Danny Carpenter, male   DOB: [birth year], 70 y.o.   MRN: [id number]     Advanced Heart Failure Rounding Note  PCP-Cardiologist: None   Subjective:    - 4/26 S/P CABG - 5/4 Swan removed with ongoing fevers, CVVH started  Remains intubated/sedated.   On NE 1.5 mcg. Afib 60s   CVVH ongoing, weight down another 4 lbs.  CVP 18.  Pulling 100 cc/hr net negative.  Had 500 cc urine out  Not following commands.    Impella P7 Flow 4  No alarms Waveforms ok  LDH 209 => 247 => 215 => 247=>329=>350 => 477 => 370=>366 Position of Impella 08/03/20 adequate   Objective:   Weight Range: 125.3 kg Body mass index is 36.44 kg/m.   Vital Signs:   Temp:  [97.6 F (36.4 C)-98.9 F (37.2 C)] 98.9 F (37.2 C) (05/05 2353) Pulse Rate:  [58-77] 65 (05/06 0700) Resp:  [15-23] 20 (05/06 0700) BP: (83-178)/(59-166) 92/77 (05/06 0600) SpO2:  [86 %-100 %] 100 % (05/06 0700) Arterial Line BP: (76-304)/(57-301) 107/60 (05/06 0700) FiO2 (%):  [40 %] 40 % (05/06 0304) Last BM Date: 08/04/20  Weight change: Filed Weights   08/01/20 0500 08/02/20 0415 08/03/20 0400  Weight: 129.5 kg 127.9 kg 125.3 kg    Intake/Output:   Intake/Output Summary (Last 24 hours) at 08/04/2020 0717 Last data filed at 08/04/2020 0700 Gross per 24 hour  Intake 3524.39 ml  Output 6688 ml  Net -3163.61 ml      Physical Exam   CVP 18 General:  Intubated.  HEENT: ETT  Neck: supple. To jaw . Carotids 2+ bilat; no bruits. No lymphadenopathy or thryomegaly appreciated. Cor: PMI nondisplaced. Regular rate & rhythm. No rubs, gallops or murmurs. Impella R axillary Lungs: Coarse throughout  Abdomen: soft, nontender, nondistended. No hepatosplenomegaly. No bruits or masses. Good bowel sounds. Extremities: no cyanosis, clubbing, rash, R and LLE 2+ edema Neuro: sedated on vent.   Telemetry   atrial fibrillation 70s Personally reviewed   Labs    CBC Recent Labs    08/03/20 0337 08/04/20 0004  08/04/20 0304 08/04/20 0351  WBC 14.5*  --  17.4*  --   HGB 9.3*   < > 8.7* 9.2*  HCT 30.6*   < > 28.6* 27.0*  MCV 94.4  --  93.2  --   PLT 143*  --  140*  --    < > = values in this interval not displayed.   Basic Metabolic Panel Recent Labs    08/03/20 0337 08/03/20 1619 08/04/20 0004 08/04/20 0304 08/04/20 0351  NA 136 136   < > 135 137  K 4.6 4.9   < > 4.9 5.0  CL 101 103  --  104  --   CO2 26 27  --  24  --   GLUCOSE 163* 173*  --  158*  --   BUN 46* 40*  --  39*  --   CREATININE 2.00* 2.01*  --  1.93*  --   CALCIUM 7.9* 7.6*  --  7.7*  --   MG 2.5*  --   --  2.4  --   PHOS 2.7 4.2  --  3.5  --    < > = values in this interval not displayed.   Liver Function Tests Recent Labs    08/03/20 1619 08/04/20 0304  ALBUMIN 2.2* 2.2*   No results for input(s): LIPASE, AMYLASE in the last 72 hours. Cardiac Enzymes No results  for input(s): CKTOTAL, CKMB, CKMBINDEX, TROPONINI in the last 72 hours.  BNP: BNP (last 3 results) Recent Labs    07/02/2020 1211  BNP 465.5*    ProBNP (last 3 results) No results for input(s): PROBNP in the last 8760 hours.   D-Dimer No results for input(s): DDIMER in the last 72 hours. Hemoglobin A1C No results for input(s): HGBA1C in the last 72 hours. Fasting Lipid Panel No results for input(s): CHOL, HDL, LDLCALC, TRIG, CHOLHDL, LDLDIRECT in the last 72 hours. Thyroid Function Tests No results for input(s): TSH, T4TOTAL, T3FREE, THYROIDAB in the last 72 hours.  Invalid input(s): FREET3  Other results:   Imaging    ECHOCARDIOGRAM LIMITED  Result Date: 08/03/2020    ECHOCARDIOGRAM LIMITED REPORT   Patient Name:   [facility] Date of Exam: 08/03/2020 Medical Rec #:  [id number]     Height:       73.0 in Accession #:    [id number]    Weight:       276.2 lb Date of Birth:  1950-05-07    BSA:          2.468 m Patient Age:    41 years      BP:           118/79 mmHg Patient Gender: M             HR:           73 bpm. Exam Location:   Inpatient Procedure: Limited Echo and Color Doppler Indications:    I50.23 Acute on chronic systolic (congestive) heart failure  History:        Patient has prior history of Echocardiogram examinations, most                 recent 08/02/2020. COPD, Stroke and PAD; Risk                 Factors:Hypertension, Diabetes, Dyslipidemia and Sleep Apnea.                 Seizures. AAA. GERD.  Sonographer:    Jonelle Sidle Dance Referring Phys: [place]  1. Left ventricular ejection fraction, by estimation, is <20%. The left ventricle has severely decreased function. Limited echo for Impella positioning. Compared to prior study support device has been pulled out of the left ventircle. Measured at 4.75 cm from the annulus. FINDINGS  Left Ventricle: Left ventricular ejection fraction, by estimation, is <20%. The left ventricle has severely decreased function. Rudean Haskell MD Electronically signed by Rudean Haskell MD Signature Date/Time: 08/03/2020/5:58:54 PM    Final      Medications:     Scheduled Medications: . sodium chloride   Intravenous Once  . sodium chloride   Intravenous Once  . amiodarone  200 mg Per Tube BID  . artificial tears  1 application Both Eyes Q6V  . aspirin EC  325 mg Oral Daily   Or  . aspirin  324 mg Per Tube Daily  . B-complex with vitamin C  1 tablet Per Tube Daily  . bisacodyl  10 mg Oral Daily   Or  . bisacodyl  10 mg Rectal Daily  . chlorhexidine gluconate (MEDLINE KIT)  15 mL Mouth Rinse BID  . Chlorhexidine Gluconate Cloth  6 each Topical Daily  . docusate  200 mg Per Tube Daily  . feeding supplement (PROSource TF)  45 mL Per Tube QID  . insulin aspart  10 Units Subcutaneous Q4H  . insulin aspart  3-9 Units Subcutaneous Q4H  . insulin detemir  29 Units Subcutaneous Q12H  . mouth rinse  15 mL Mouth Rinse 10 times per day  . metoCLOPramide (REGLAN) injection  5 mg Intravenous Q8H  . neostigmine  0.25 mg Subcutaneous Q6H  . pantoprazole sodium   40 mg Per Tube Daily  . polyethylene glycol  17 g Per Tube Daily  . rosuvastatin  40 mg Per Tube Daily  . sodium chloride flush  10-40 mL Intracatheter Q12H  . sodium chloride flush  3 mL Intravenous Q12H    Infusions: .  prismasol BGK 4/2.5 500 mL/hr at 08/04/20 0548  .  prismasol BGK 4/2.5 300 mL/hr at 08/04/20 0548  . sodium chloride Stopped (08/01/20 1202)  . sodium chloride    . dexmedetomidine (PRECEDEX) IV infusion 0.8 mcg/kg/hr (08/04/20 0700)  . dextrose    . feeding supplement (VITAL 1.5 CAL) 1,000 mL (08/03/20 2003)  . fentaNYL infusion INTRAVENOUS 200 mcg/hr (08/04/20 0700)  . heparin 1,300 Units/hr (08/04/20 0700)  . impella catheter heparin 50 unit/mL in dextrose 5% 11.9 mL/hr at 07/31/20 0148  . lactated ringers    . norepinephrine (LEVOPHED) Adult infusion 1.5 mcg/min (08/04/20 0700)  . prismasol BGK 4/2.5 1,500 mL/hr at 08/04/20 0548    PRN Medications: sodium chloride, Place/Maintain arterial line **AND** sodium chloride, acetaminophen, dextrose, fentaNYL, fentaNYL (SUBLIMAZE) injection, heparin, lactated ringers, metoprolol tartrate, ondansetron (ZOFRAN) IV, sodium chloride, sodium chloride flush, sodium chloride flush, traMADol  Assessment/Plan   1. Cardiogenic shock: Ischemic cardiomyopathy, post-CABG on 4/26.  He has Impella 5.5 in place, at P7 with adequate flow and no alarms.  Limited echo 4/29 with EF< 20%, the RV appears normal in size with severe systolic dysfunction. Hemodynamically stable today off NE and on Impella P7.    Started CVVH on 5/3, pulling 100 cc/hr net negative.  - Continue Impella P7 while we are pulling fluid. Continue heparin gtt.  - Continue to pull around ~ 100 cc net negative via CVVH.  Had 500 cc urine output.  - Add midodrine 5 mg tid. Stop Norep  2. CAD: s/p CABG x 5 with LIMA-LAD, seq SVG-D1/ramus, seq SVG-PDA/PLV.   - ASA - Crestor 3. Anemia: Post-op bleeding, back to OR with multiple products given on 4/26 post-CABG.  Hgb 8.7   4. Thrombocytopenia: Plts 90K -> 112->114->128->143->143 ->140  today, suspect low post-op/post-surgical bleeding and multiple blood products as well as sepsis.  Stable with Impella.  5. PAD: Extensive history.  6. AAA: Monitoring as outpatient.  7. Type 2 DM: Insulin.  Hgb A1c was 11.6, poor control.  8. Atrial fibrillation: Rate-controlled.  Continue per tube amiodarone for now.   9. Neuro: intubated and sedated, not following commands with sedation wean.  - Not following commands. CT head per Dr Aundra Dubin.   10. ID: Now afebrile.  PCT 7.5 on 4/29.  Currently on cefepime.  - Continue cefepime per CCM.  11. Acute hypoxemic respiratory failure: Pulmonary edema, possible PNA.  On vent, unable to wean just yet with marked volume overload.  May need trach if cannot extubate soon.  - Per CCM.  12. AKI:  CVVH started with marked volume overload and worsening renal function.  Still making some urine.  Avoid hypotension and excessive CVVH rates to allow future renal recovery.  Had 500 cc urine output. Continue bladder scan.  13. Ileus: Seems to be resolving, tube feeds restarted.   - Continue Reglan and neostigmine.    Amy Clegg NP-C  08/04/2020 7:17  AM  Patient seen with NP, agree with the above note.   CVP 18 today, CVVH net UF 100 cc/hr, weight trending down.    Impella at P7, LDH trending down and platelets stable.  Position ok by echo yesterday.   General: Sedated on vent.  Neck: Thick, JVP 16+, no thyromegaly or thyroid nodule.  Lungs: Clear to auscultation bilaterally with normal respiratory effort. CV: s/p sternotomy.  Heart irregular S1/S2, no S3/S4, no murmur.  1+ LE edema. Abdomen: Soft, nontender, no hepatosplenomegaly, no distention.  Skin: Intact without lesions or rashes.  Neurologic: Does not follow commands Extremities: No clubbing or cyanosis.  HEENT: Normal.   Continue current CVVH, needs further fluid off.  Keep Impella at P7 while pulling fluid.  Can stop NE and start  low dose midodrine.    Heparin gtt for Impella and afib.   Not responsive, will wean sedation and think we need head CT w/o contrast today.   Cefepime ongoing, no fevers though WBCs still elevated.   CRITICAL CARE Performed by: Loralie Champagne  Total critical care time: 40 minutes  Critical care time was exclusive of separately billable procedures and treating other patients.  Critical care was necessary to treat or prevent imminent or life-threatening deterioration.  Critical care was time spent personally by me on the following activities: development of treatment plan with patient and/or surrogate as well as nursing, discussions with consultants, evaluation of patient's response to treatment, examination of patient, obtaining history from patient or surrogate, ordering and performing treatments and interventions, ordering and review of laboratory studies, ordering and review of radiographic studies, pulse oximetry and re-evaluation of patient's condition.  Loralie Champagne 08/04/2020 7:39 AM

## 2020-08-04 NOTE — Plan of Care (Signed)
  Problem: Pain Managment: Goal: General experience of comfort will improve Outcome: Progressing   Problem: Safety: Goal: Ability to remain free from injury will improve Outcome: Progressing   Problem: Skin Integrity: Goal: Risk for impaired skin integrity will decrease Outcome: Not Progressing

## 2020-08-04 NOTE — Progress Notes (Signed)
[place] for Heparin Indication: Impella 5.5  No Known Allergies  Patient Measurements: Height: '6\' 1"'$  (185.4 cm) Weight: 125.3 kg (276 lb 3.8 oz) IBW/kg (Calculated) : 79.9 Heparin Dosing Weight: 102.8 kg  Vital Signs: Temp: 98.9 F (37.2 C) (05/05 2353) Temp Source: Oral (05/05 2353) BP: 92/77 (05/06 0600) Pulse Rate: 65 (05/06 0700)  Labs: Recent Labs    08/02/20 0316 08/02/20 1600 08/03/20 0337 08/03/20 1619 08/04/20 0004 08/04/20 0304 08/04/20 0351  HGB 8.7*  --  9.3*  --  11.2* 8.7* 9.2*  HCT 28.9*  --  30.6*  --  33.0* 28.6* 27.0*  PLT 143*  --  143*  --   --  140*  --   HEPARINUNFRC 0.16*   < > 0.24* 0.17*  --  0.19*  --   CREATININE 2.75*   < > 2.00* 2.01*  --  1.93*  --    < > = values in this interval not displayed.    Estimated Creatinine Clearance: 50.8 mL/min (A) (by C-G formula based on SCr of 1.93 mg/dL (H)).   Assessment: 70 yo M presents with NSTEMI and multivessel CAD, now s/p CABG with Impella support. Pharmacy asked to manage systemic heparin.  Heparin level is close to goal range at 0.19, on 1300 units/hr. Hgb 9.2, plt 140. LDH 366. Purge solution is running at 11.2 mL/hr (560 units/hr) - pressures in 400-500s, on P7.    Goal of Therapy:  Heparin level 0.2-0.3 units/ml Monitor platelets by anticoagulation protocol: Yes   Plan:  Continue heparin purge solution (50 units/ml) Increase IV heparin to 1350 units/hr to get in goal range Check q12h heparin level for now  Antonietta Jewel, PharmD, [place] Pharmacist  Phone: [phone] 08/04/2020 7:18 AM  Please check AMION for all [place] phone numbers After 10:00 PM, call [facility] [phone]

## 2020-08-04 NOTE — Progress Notes (Signed)
NAMEXandar Carpenter, MRN:  [id number], DOB:  [birth year], LOS: 8 ADMISSION DATE:  07/16/2020, CONSULTATION DATE: 07/28/2018 REFERRING MD: Dr. Orvan Seen, CHIEF COMPLAINT: Ventilator dependent  History of Present Illness:  Danny Carpenter is a 70 year old male with past medical history significant for hypertension, hyperlipidemia, type 2 diabetes, CVA, AAA, left foot amputation s/p fem pop bypass and heart failure who presented to the emergency department 4/21 after undergoing outpatient stress test that revealed EF of 20% and acute ischemia.  Since admission patient has been evaluated by cardiology and cardiothoracic surgery and patient underwent TEE and left heart cath.  LHC/RHC revealed severe three-vessel coronary artery disease with moderately elevated left heart and pulmonary artery pressures.  At that time patient was placed on goal-directed medical therapy for acute systolic congestive heart failure and CTS surgery was consulted for CABG potential.  Patient underwent CABG x5 with Dr. Orvan Seen 4/26, post CABG CODE BLUE called due to low flow on Impella, CTS notified.  Patient underwent bedside exploratory postoperative open heart where Dr. Julien Girt reopened the chest incision and hematoma was removed, patient's hemodynamics improved upon opening sternotomy.  PCCM consulted morning of 4/28 due to difficulty weaning ventilator  Pertinent  Medical History  Hypertension, hyperlipidemia, type 2 diabetes, CVA, AAA, left foot amputation s/p fem pop bypass and heart failure   Significant Hospital Events: Including procedures, antibiotic start and stop dates in addition to other pertinent events   . 4/21 presented to the ED for evaluation of abnormal stress test . 4/22 left and right heart cath severe multivessel disease . 4/26 CABG x5 with hemodynamic instability post requiring brief CPR with eventual return back to the OR anastomosis bleeding was seen . 4/28 remains ventilator dependent, slowly weaning  PEEP . 5/1 remains on vent, diuresing, no ready to come off  . 5/3 started on CRRT. . 5/5 requiring sedation for vent  . 5/6 CT scan shows multiple areas of posterior circulation infarction.  Interim History / Subjective:   Continues to tolerate fluid removal via CRRT.  On minimal vasopressors.  Continues to require sedation for periods of nonpurposeful agitation when sedation is paused.  Objective   Blood pressure 101/65, pulse 67, temperature 99.1 F (37.3 C), temperature source Oral, resp. rate 20, height '6\' 1"'$  (1.854 m), weight 125.3 kg, SpO2 100 %. CVP:  [17 mmHg-87 mmHg] 20 mmHg  Vent Mode: PRVC FiO2 (%):  [40 %] 40 % Set Rate:  [20 bmp] 20 bmp Vt Set:  [630 mL] 630 mL PEEP:  [5 cmH20] 5 cmH20 Plateau Pressure:  [19 cmH20-24 cmH20] 24 cmH20   Intake/Output Summary (Last 24 hours) at 08/04/2020 1647 Last data filed at 08/04/2020 1600 Gross per 24 hour  Intake 3138.27 ml  Output 5658 ml  Net -2519.73 ml   Filed Weights   08/01/20 0500 08/02/20 0415 08/03/20 0400  Weight: 129.5 kg 127.9 kg 125.3 kg    Examination: General: Critically ill appearing older adult M intubated sedated NAD HEENT: ETT secure Anicteric sclera Pink mmm Neuro: Sedated.  Minimal response to painful stimuli. CV: impella p7. Cap refill < 3 sec. irreg rhythm.  PULM: Mechanically ventilated. Symmetrical chest expansion. Diminished basilar sounds GI: Soft round ndnt  GU: CRRT Extremities: Anasarca. No acute joint deformity. No clubbing or cyanosis. L metatarsal amputation Skin: Midline sternotomy incision well approximated, c/d/healing   Labs/imaging that I have personally reviewed    CT head personally reviewed and shows multiple areas of brainstem and cerebellar infarction.  Resolved Hospital Problem list  Assessment & Plan:   Critically ill due to acute hypoxic respiratory failure  R pleural effusion, mild pulmonary edema  Critically ill due to cardiogenic Shock requiring mechanical cardiac  support Persistent encephalopathy-multifactorial including prolonged ICU stay and sedative infusions Evidence of acute posterior circulation CVA CAD sp CABG x5 AKI Colonic ileus Anemia, post op  Thrombocytopenia - stable  Low grade pyrexia of unclear etiology PAD s/p partial L foot amputation  AAA Poorly controlled DM2 with microvascular complications.   Plan:  -Continue current ventilator strategy -Continue cardiac support primarily with Impella. -Continue aggressive fluid removal with CRRT targeting net 100-150 mL/h -Neurology to see regarding prognosis from posterior circulation cerebral infarction.   Best practice   Diet:  Tube Feed  - on hold for ileus, started Reglan and neostigmine.  Pain/Anxiety/Delirium protocol (if indicated): Yes (RASS goal -1) - fentanyl prn, wean Precedex as tolerated VAP protocol (if indicated): Yes DVT prophylaxis: Systemic AC - Heparin in Impella purge. GI prophylaxis: PPI Glucose control:  SSI Yes - SSI + basal + q4  Central venous access:  Yes, and it is still needed, Arterial line:  Yes, and it is still needed -- may need to replace  Foley:  Yes, and it is no longer needed - Can remove now that on CRRT Mobility:  bed rest  PT consulted: N/A Last date of multidisciplinary goals of care discussion: I spoke with the patient's sons today 5/6 and informed them that he remains critically ill from a cardiovascular standpoint and that recovery is not guaranteed elucidate accept.  They also understand that presence of cerebral infarction would likely indicate a poor overall outcome and that this a transition to comfort care may be appropriate at that time. Code Status:  full code Disposition: ICU  Labs   CBC: Recent Labs  Lab 08/01/20 0253 08/02/20 0316 08/03/20 0337 08/04/20 0004 08/04/20 0304 08/04/20 0351 08/04/20 0800 08/04/20 0850  WBC 7.4 8.4 14.5*  --  17.4*  --  18.1*  --   NEUTROABS  --   --   --   --   --   --  13.2*  --   HGB  8.4* 8.7* 9.3* 11.2* 8.7* 9.2* 9.2* 10.2*  HCT 27.7* 28.9* 30.6* 33.0* 28.6* 27.0* 29.8* 30.0*  MCV 92.6 94.1 94.4  --  93.2  --  92.8  --   PLT 128* 143* 143*  --  140*  --  158  --     Basic Metabolic Panel: Recent Labs  Lab 07/29/20 1522 07/30/20 0331 07/30/20 0339 08/02/20 0316 08/02/20 1600 08/03/20 0337 08/03/20 1619 08/04/20 0004 08/04/20 0304 08/04/20 0351 08/04/20 0850  NA 137  --    < > 135 138 136 136 136 135 137 137  K 4.0  --    < > 4.7 4.4 4.6 4.9 4.8 4.9 5.0 4.7  CL 104  --    < > 106 103 101 103  --  104  --   --   CO2 22  --    < > '24 26 26 27  '$ --  24  --   --   GLUCOSE 328*  --    < > 258* 158* 163* 173*  --  158*  --   --   BUN 41*  --    < > 62* 54* 46* 40*  --  39*  --   --   CREATININE 1.95*  --    < > 2.75* 2.28* 2.00* 2.01*  --  1.93*  --   --   CALCIUM 7.2*  --    < > 7.4* 7.7* 7.9* 7.6*  --  7.7*  --   --   MG 2.4 2.3  --  2.2  --  2.5*  --   --  2.4  --   --   PHOS 3.8 3.0   < > 2.2* 3.6 2.7 4.2  --  3.5  --   --    < > = values in this interval not displayed.   GFR: Estimated Creatinine Clearance: 50.8 mL/min (A) (by C-G formula based on SCr of 1.93 mg/dL (H)). Recent Labs  Lab 07/29/20 0358 07/30/20 0331 07/31/20 0337 08/01/20 0819 08/02/20 0316 08/03/20 0337 08/04/20 0304 08/04/20 0800  PROCALCITON 7.50 5.06  --   --   --   --   --   --   WBC 5.4 7.5   < >  --  8.4 14.5* 17.4* 18.1*  LATICACIDVEN 0.9  --   --  0.8  --   --   --   --    < > = values in this interval not displayed.    Liver Function Tests: Recent Labs  Lab 08/02/20 0316 08/02/20 1600 08/03/20 0337 08/03/20 1619 08/04/20 0304  ALBUMIN 2.1* 2.2* 2.3* 2.2* 2.2*   No results for input(s): LIPASE, AMYLASE in the last 168 hours. No results for input(s): AMMONIA in the last 168 hours.  ABG    Component Value Date/Time   PHART 7.348 (L) 08/04/2020 0850   PCO2ART 46.1 08/04/2020 0850   PO2ART 72 (L) 08/04/2020 0850   HCO3 25.4 08/04/2020 0850   TCO2 27  08/04/2020 0850   ACIDBASEDEF 1.0 08/04/2020 0850   O2SAT 93.0 08/04/2020 0850     Coagulation Profile: No results for input(s): INR, PROTIME in the last 168 hours.  Cardiac Enzymes: No results for input(s): CKTOTAL, CKMB, CKMBINDEX, TROPONINI in the last 168 hours.  HbA1C: Hgb A1c MFr Bld  Date/Time Value Ref Range Status  07/19/2020 06:28 PM 11.6 (H) 4.8 - 5.6 % Final    Comment:    (NOTE) Pre diabetes:          5.7%-6.4%  Diabetes:              >6.4%  Glycemic control for   <7.0% adults with diabetes   08/05/2019 03:09 AM 11.6 (H) 4.8 - 5.6 % Final    Comment:    (NOTE) Pre diabetes:          5.7%-6.4% Diabetes:              >6.4% Glycemic control for   <7.0% adults with diabetes     CBG: Recent Labs  Lab 08/03/20 2116 08/03/20 2342 08/04/20 0352 08/04/20 0819 08/04/20 1111  GLUCAP 114* 116* 151* 147* 155*    CRITICAL CARE Performed by: Kipp Brood   Total critical care time: 50 minutes  Critical care time was exclusive of separately billable procedures and treating other patients. Critical care was necessary to treat or prevent imminent or life-threatening deterioration.  Critical care was time spent personally by me on the following activities: development of treatment plan with patient and/or surrogate as well as nursing, discussions with consultants, evaluation of patient's response to treatment, examination of patient, obtaining history from patient or surrogate, ordering and performing treatments and interventions, ordering and review of laboratory studies, ordering and review of radiographic studies, pulse oximetry and re-evaluation of patient's condition.  Kipp Brood, MD  [facility] ICU Physician [place]  Pager: [phone] Or Epic Secure Chat After hours: [phone].  08/04/2020, 4:59 PM      08/04/2020, 4:47 PM

## 2020-08-04 NOTE — TOC Progression Note (Signed)
Transition of Care (TOC) - Progression Note  Heart Failure   Patient Details  Name: Theodies Rhein MRN: [id number] Date of Birth: 1950/11/12  Transition of Care [facility]) CM/SW [place], [place] Phone Number: 08/04/2020, 8:48 AM  Clinical Narrative:    CSW attempted to visit the patient at bedside to introduce self as the heart failure social worker and to complete a very brief SDOH screening with the patient to address social needs as needed however the patient was unresponsive and unable to engage in conversation at this time.   CSW will continue to follow for discharge needs and will check back with the patient at another time.       Barriers to Discharge: Continued Medical Work up  Expected Discharge Plan and Services   In-house Referral: Clinical Social Work                                             Social Determinants of Health (SDOH) Interventions    Readmission Risk Interventions Readmission Risk Prevention Plan 01/29/2019 01/20/2019  Post Dischage Appt - Complete  Medication Screening - Complete  Transportation Screening Complete Complete  PCP or Specialist Appt within 5-7 Days Complete -  Home Care Screening Complete -  Medication Review (RN CM) Complete -  Some recent data might be hidden   Cora Stetson, MSW, LCSWA [phone] Heart Failure Social Worker

## 2020-08-05 LAB — CBC
HCT: 28.3 % — ABNORMAL LOW (ref 39.0–52.0)
Hemoglobin: 8.6 g/dL — ABNORMAL LOW (ref 13.0–17.0)
MCH: 28.4 pg (ref 26.0–34.0)
MCHC: 30.4 g/dL (ref 30.0–36.0)
MCV: 93.4 fL (ref 80.0–100.0)
Platelets: 154 10*3/uL (ref 150–400)
RBC: 3.03 MIL/uL — ABNORMAL LOW (ref 4.22–5.81)
RDW: 17.4 % — ABNORMAL HIGH (ref 11.5–15.5)
WBC: 23.7 10*3/uL — ABNORMAL HIGH (ref 4.0–10.5)
nRBC: 0.5 % — ABNORMAL HIGH (ref 0.0–0.2)

## 2020-08-05 LAB — GLUCOSE, CAPILLARY
Glucose-Capillary: 118 mg/dL — ABNORMAL HIGH (ref 70–99)
Glucose-Capillary: 141 mg/dL — ABNORMAL HIGH (ref 70–99)
Glucose-Capillary: 149 mg/dL — ABNORMAL HIGH (ref 70–99)
Glucose-Capillary: 177 mg/dL — ABNORMAL HIGH (ref 70–99)
Glucose-Capillary: 189 mg/dL — ABNORMAL HIGH (ref 70–99)
Glucose-Capillary: 206 mg/dL — ABNORMAL HIGH (ref 70–99)
Glucose-Capillary: 253 mg/dL — ABNORMAL HIGH (ref 70–99)

## 2020-08-05 LAB — POCT I-STAT, CHEM 8
BUN: 42 mg/dL — ABNORMAL HIGH (ref 8–23)
BUN: 42 mg/dL — ABNORMAL HIGH (ref 8–23)
BUN: 43 mg/dL — ABNORMAL HIGH (ref 8–23)
BUN: 51 mg/dL — ABNORMAL HIGH (ref 8–23)
BUN: 51 mg/dL — ABNORMAL HIGH (ref 8–23)
Calcium, Ion: 0.45 mmol/L — CL (ref 1.15–1.40)
Calcium, Ion: 0.45 mmol/L — CL (ref 1.15–1.40)
Calcium, Ion: 0.49 mmol/L — CL (ref 1.15–1.40)
Calcium, Ion: 1.07 mmol/L — ABNORMAL LOW (ref 1.15–1.40)
Calcium, Ion: 1.11 mmol/L — ABNORMAL LOW (ref 1.15–1.40)
Chloride: 100 mmol/L (ref 98–111)
Chloride: 101 mmol/L (ref 98–111)
Chloride: 95 mmol/L — ABNORMAL LOW (ref 98–111)
Chloride: 96 mmol/L — ABNORMAL LOW (ref 98–111)
Chloride: 96 mmol/L — ABNORMAL LOW (ref 98–111)
Creatinine, Ser: 1.7 mg/dL — ABNORMAL HIGH (ref 0.61–1.24)
Creatinine, Ser: 1.8 mg/dL — ABNORMAL HIGH (ref 0.61–1.24)
Creatinine, Ser: 1.8 mg/dL — ABNORMAL HIGH (ref 0.61–1.24)
Creatinine, Ser: 2.3 mg/dL — ABNORMAL HIGH (ref 0.61–1.24)
Creatinine, Ser: 2.5 mg/dL — ABNORMAL HIGH (ref 0.61–1.24)
Glucose, Bld: 176 mg/dL — ABNORMAL HIGH (ref 70–99)
Glucose, Bld: 177 mg/dL — ABNORMAL HIGH (ref 70–99)
Glucose, Bld: 188 mg/dL — ABNORMAL HIGH (ref 70–99)
Glucose, Bld: 208 mg/dL — ABNORMAL HIGH (ref 70–99)
Glucose, Bld: 209 mg/dL — ABNORMAL HIGH (ref 70–99)
HCT: 22 % — ABNORMAL LOW (ref 39.0–52.0)
HCT: 23 % — ABNORMAL LOW (ref 39.0–52.0)
HCT: 26 % — ABNORMAL LOW (ref 39.0–52.0)
HCT: 26 % — ABNORMAL LOW (ref 39.0–52.0)
HCT: 26 % — ABNORMAL LOW (ref 39.0–52.0)
Hemoglobin: 7.5 g/dL — ABNORMAL LOW (ref 13.0–17.0)
Hemoglobin: 7.8 g/dL — ABNORMAL LOW (ref 13.0–17.0)
Hemoglobin: 8.8 g/dL — ABNORMAL LOW (ref 13.0–17.0)
Hemoglobin: 8.8 g/dL — ABNORMAL LOW (ref 13.0–17.0)
Hemoglobin: 8.8 g/dL — ABNORMAL LOW (ref 13.0–17.0)
Potassium: 4.6 mmol/L (ref 3.5–5.1)
Potassium: 4.7 mmol/L (ref 3.5–5.1)
Potassium: 4.9 mmol/L (ref 3.5–5.1)
Potassium: 5.1 mmol/L (ref 3.5–5.1)
Potassium: 5.4 mmol/L — ABNORMAL HIGH (ref 3.5–5.1)
Sodium: 135 mmol/L (ref 135–145)
Sodium: 136 mmol/L (ref 135–145)
Sodium: 137 mmol/L (ref 135–145)
Sodium: 138 mmol/L (ref 135–145)
Sodium: 138 mmol/L (ref 135–145)
TCO2: 22 mmol/L (ref 22–32)
TCO2: 25 mmol/L (ref 22–32)
TCO2: 26 mmol/L (ref 22–32)
TCO2: 27 mmol/L (ref 22–32)
TCO2: 27 mmol/L (ref 22–32)

## 2020-08-05 LAB — RENAL FUNCTION PANEL
Albumin: 2.1 g/dL — ABNORMAL LOW (ref 3.5–5.0)
Albumin: 2.1 g/dL — ABNORMAL LOW (ref 3.5–5.0)
Anion gap: 8 (ref 5–15)
Anion gap: 9 (ref 5–15)
BUN: 43 mg/dL — ABNORMAL HIGH (ref 8–23)
BUN: 51 mg/dL — ABNORMAL HIGH (ref 8–23)
CO2: 23 mmol/L (ref 22–32)
CO2: 23 mmol/L (ref 22–32)
Calcium: 7.6 mg/dL — ABNORMAL LOW (ref 8.9–10.3)
Calcium: 7.7 mg/dL — ABNORMAL LOW (ref 8.9–10.3)
Chloride: 102 mmol/L (ref 98–111)
Chloride: 101 mmol/L (ref 98–111)
Creatinine, Ser: 2.17 mg/dL — ABNORMAL HIGH (ref 0.61–1.24)
Creatinine, Ser: 2.55 mg/dL — ABNORMAL HIGH (ref 0.61–1.24)
GFR, Estimated: 32 mL/min — ABNORMAL LOW (ref >60)
GFR, Estimated: 27 mL/min — ABNORMAL LOW (ref >60)
Glucose, Bld: 192 mg/dL — ABNORMAL HIGH (ref 70–99)
Glucose, Bld: 203 mg/dL — ABNORMAL HIGH (ref 70–99)
Phosphorus: 3.6 mg/dL (ref 2.5–4.6)
Phosphorus: 5.5 mg/dL — ABNORMAL HIGH (ref 2.5–4.6)
Potassium: 5.5 mmol/L — ABNORMAL HIGH (ref 3.5–5.1)
Potassium: 5.7 mmol/L — ABNORMAL HIGH (ref 3.5–5.1)
Sodium: 133 mmol/L — ABNORMAL LOW (ref 135–145)
Sodium: 133 mmol/L — ABNORMAL LOW (ref 135–145)

## 2020-08-05 LAB — POCT I-STAT 7, (LYTES, BLD GAS, ICA,H+H)
Acid-base deficit: 2 mmol/L (ref 0.0–2.0)
Bicarbonate: 24 mmol/L (ref 20.0–28.0)
Calcium, Ion: 1.09 mmol/L — ABNORMAL LOW (ref 1.15–1.40)
HCT: 25 % — ABNORMAL LOW (ref 39.0–52.0)
Hemoglobin: 8.5 g/dL — ABNORMAL LOW (ref 13.0–17.0)
O2 Saturation: 98 %
Patient temperature: 98.7
Potassium: 5.3 mmol/L — ABNORMAL HIGH (ref 3.5–5.1)
Sodium: 135 mmol/L (ref 135–145)
TCO2: 25 mmol/L (ref 22–32)
pCO2 arterial: 45.7 mmHg (ref 32.0–48.0)
pH, Arterial: 7.329 — ABNORMAL LOW (ref 7.350–7.450)
pO2, Arterial: 106 mmHg (ref 83.0–108.0)

## 2020-08-05 LAB — HEPARIN LEVEL (UNFRACTIONATED)
Heparin Unfractionated: 0.16 IU/mL — ABNORMAL LOW (ref 0.30–0.70)
Heparin Unfractionated: 0.18 IU/mL — ABNORMAL LOW (ref 0.30–0.70)

## 2020-08-05 LAB — COOXEMETRY PANEL
Carboxyhemoglobin: 1.2 % (ref 0.5–1.5)
Methemoglobin: 1 % (ref 0.0–1.5)
O2 Saturation: 45.5 %
Total hemoglobin: 8.5 g/dL — ABNORMAL LOW (ref 12.0–16.0)

## 2020-08-05 LAB — BASIC METABOLIC PANEL
Anion gap: 7 (ref 5–15)
BUN: 44 mg/dL — ABNORMAL HIGH (ref 8–23)
CO2: 24 mmol/L (ref 22–32)
Calcium: 7.5 mg/dL — ABNORMAL LOW (ref 8.9–10.3)
Chloride: 102 mmol/L (ref 98–111)
Creatinine, Ser: 2.21 mg/dL — ABNORMAL HIGH (ref 0.61–1.24)
GFR, Estimated: 32 mL/min — ABNORMAL LOW (ref >60)
Glucose, Bld: 193 mg/dL — ABNORMAL HIGH (ref 70–99)
Potassium: 5.5 mmol/L — ABNORMAL HIGH (ref 3.5–5.1)
Sodium: 133 mmol/L — ABNORMAL LOW (ref 135–145)

## 2020-08-05 LAB — LACTATE DEHYDROGENASE: LDH: 408 U/L — ABNORMAL HIGH (ref 98–192)

## 2020-08-05 LAB — HEMOGLOBIN AND HEMATOCRIT, BLOOD
HCT: 26.8 % — ABNORMAL LOW (ref 39.0–52.0)
Hemoglobin: 8.1 g/dL — ABNORMAL LOW (ref 13.0–17.0)

## 2020-08-05 LAB — MAGNESIUM: Magnesium: 2.6 mg/dL — ABNORMAL HIGH (ref 1.7–2.4)

## 2020-08-05 MED ORDER — DEXTROSE 5 % IV SOLN
20.0000 g | INTRAVENOUS | Status: DC
  Administered 2020-08-05 – 2020-08-10 (×7): 20 g via INTRAVENOUS_CENTRAL
  Filled 2020-08-05 (×13): qty 200

## 2020-08-05 MED ORDER — PRISMASOL BGK 0/2.5 32-2.5 MEQ/L EC SOLN
Status: DC
  Filled 2020-08-05 (×7): qty 5000

## 2020-08-05 MED ORDER — AMIODARONE HCL IN DEXTROSE 360-4.14 MG/200ML-% IV SOLN
30.0000 mg/h | INTRAVENOUS | Status: DC
  Administered 2020-08-05 – 2020-08-09 (×9): 30 mg/h via INTRAVENOUS
  Filled 2020-08-05 (×10): qty 200

## 2020-08-05 MED ORDER — ACD FORMULA A 0.73-2.45-2.2 GM/100ML VI SOLN
3000.0000 mL | Status: DC
  Administered 2020-08-05 (×2): 1000 mL via INTRAVENOUS_CENTRAL
  Administered 2020-08-06: 3000 mL via INTRAVENOUS_CENTRAL
  Administered 2020-08-06 – 2020-08-07 (×3): 1000 mL via INTRAVENOUS_CENTRAL
  Administered 2020-08-07 (×3): 3000 mL via INTRAVENOUS_CENTRAL
  Administered 2020-08-07: 1000 mL via INTRAVENOUS_CENTRAL
  Administered 2020-08-08 – 2020-08-11 (×10): 3000 mL via INTRAVENOUS_CENTRAL
  Filled 2020-08-05 (×6): qty 3000
  Filled 2020-08-05: qty 1000
  Filled 2020-08-05 (×33): qty 3000
  Filled 2020-08-05: qty 1000
  Filled 2020-08-05: qty 3000

## 2020-08-05 NOTE — Progress Notes (Signed)
      [address]       [place] [address]             [phone]        CARDIOTHORACIC SURGERY PROGRESS NOTE   R11 Days Post-Op Procedure(s) (LRB): EXPLORATION POST OPERATIVE OPEN HEART (N/A)  Subjective: Sedated on vent.  Some non-purposeful movement with noxious stimuli, no purposeful movement.  No events overnight  Objective: Vital signs: BP Readings from Last 1 Encounters:  08/05/20 (!) 118/105   Pulse Readings from Last 1 Encounters:  08/05/20 64   Resp Readings from Last 1 Encounters:  08/05/20 20   Temp Readings from Last 1 Encounters:  08/05/20 98.9 F (37.2 C) (Oral)    Hemodynamics: CVP:  [14 mmHg-25 mmHg] 14 mmHg  Mixed venous co-ox 45.5%   Physical Exam:  Rhythm:   Afib w/ controlled rate  Breath sounds: Coarse but clear  Heart sounds:  irregular  Incisions:  Clean and dry  Abdomen:  Soft, non-distended  Extremities:  Warm, adequately perfused  Chest tubes:  Very low volume thin serosanguinous output, no air leak    Intake/Output from previous day: 05/06 0701 - 05/07 0700 In: 3157.8 [I.V.:1612.2; NG/GT:1310] Out: 4191  Intake/Output this shift: Total I/O In: 331.7 [I.V.:153.7; Other:23; NG/GT:155] Out: 386 [Other:386]  Lab Results:  CBC: Recent Labs    08/04/20 0800 08/04/20 0850 08/05/20 0400 08/05/20 0425  WBC 18.1*  --   --  23.7*  HGB 9.2*   < > 8.5* 8.6*  HCT 29.8*   < > 25.0* 28.3*  PLT 158  --   --  154   < > = values in this interval not displayed.    BMET:  Recent Labs    08/04/20 1630 08/05/20 0400 08/05/20 0425  NA 136 135 133*  133*  K 5.0 5.3* 5.5*  5.5*  CL 104  --  102  102  CO2 24  --  24  23  GLUCOSE 55*  --  193*  192*  BUN 42*  --  44*  43*  CREATININE 2.19*  --  2.21*  2.17*  CALCIUM 7.7*  --  7.5*  7.6*     PT/INR:  No results for input(s): LABPROT, INR in the last 72 hours.  CBG (last 3)  Recent Labs    08/05/20 0345 08/05/20 0638 08/05/20 0826  GLUCAP 118*  141* 206*    ABG    Component Value Date/Time   PHART 7.329 (L) 08/05/2020 0400   PCO2ART 45.7 08/05/2020 0400   PO2ART 106 08/05/2020 0400   HCO3 24.0 08/05/2020 0400   TCO2 25 08/05/2020 0400   ACIDBASEDEF 2.0 08/05/2020 0400   O2SAT 98.0 08/05/2020 0400    CXR: n/a  Assessment/Plan: S/P Procedure(s) (LRB): EXPLORATION POST OPERATIVE OPEN HEART (N/A)  No significant changes Neurologic status poor w/ multiple strokes in posterior circulation noted on CT head - long term prognosis related to neuro status somewhat unclear but likely poor Hemodynamics remain tenuous w/ marginal mixed venous co-ox despite full support Impella Oxygenation stable Afebrile but WBC rising   I agree w/ plans outlined by Dr Lynetta Mare to meet w/ family to discuss goals of treatment, possible withdrawal of support  Long-term prognosis is grim   Rexene Alberts, MD 08/05/2020 11:09 AM

## 2020-08-05 NOTE — Progress Notes (Signed)
NAMEWelton Carpenter, MRN:  [id number], DOB:  01-14-1951, LOS: 64 ADMISSION DATE:  07/02/2020, CONSULTATION DATE: 07/28/2018 REFERRING MD: Dr. Orvan Seen, CHIEF COMPLAINT: Ventilator dependent  History of Present Illness:  Danny Carpenter is a 70 year old male with past medical history significant for hypertension, hyperlipidemia, type 2 diabetes, CVA, AAA, left foot amputation s/p fem pop bypass and heart failure who presented to the emergency department 4/21 after undergoing outpatient stress test that revealed EF of 20% and acute ischemia.  Since admission patient has been evaluated by cardiology and cardiothoracic surgery and patient underwent TEE and left heart cath.  LHC/RHC revealed severe three-vessel coronary artery disease with moderately elevated left heart and pulmonary artery pressures.  At that time patient was placed on goal-directed medical therapy for acute systolic congestive heart failure and CTS surgery was consulted for CABG potential.  Patient underwent CABG x5 with Dr. Orvan Seen 4/26, post CABG CODE BLUE called due to low flow on Impella, CTS notified.  Patient underwent bedside exploratory postoperative open heart where Dr. Julien Girt reopened the chest incision and hematoma was removed, patient's hemodynamics improved upon opening sternotomy.  PCCM consulted morning of 4/28 due to difficulty weaning ventilator  Pertinent  Medical History  Hypertension, hyperlipidemia, type 2 diabetes, CVA, AAA, left foot amputation s/p fem pop bypass and heart failure   Significant Hospital Events: Including procedures, antibiotic start and stop dates in addition to other pertinent events   . 4/21 presented to the ED for evaluation of abnormal stress test . 4/22 left and right heart cath severe multivessel disease . 4/26 CABG x5 with hemodynamic instability post requiring brief CPR with eventual return back to the OR anastomosis bleeding was seen . 4/28 remains ventilator dependent, slowly weaning  PEEP . 5/1 remains on vent, diuresing, no ready to come off  . 5/3 started on CRRT. . 5/5 requiring sedation for vent  . 5/6 CT scan shows multiple areas of posterior circulation infarction.  Interim History / Subjective:   Unable to effectively wean sedation because of agitation with tachycardia and ventilator dyssynchrony, still nothing purposeful. Single bloody bowel movement.    Objective   Blood pressure (!) 118/105, pulse 64, temperature 99.5 F (37.5 C), temperature source Oral, resp. rate (!) 30, height '6\' 1"'$  (1.854 m), weight 125.3 kg, SpO2 99 %. CVP:  [14 mmHg-25 mmHg] 14 mmHg  Vent Mode: PSV FiO2 (%):  [40 %] 40 % Set Rate:  [20 bmp] 20 bmp Vt Set:  [630 mL] 630 mL PEEP:  [5 cmH20] 5 cmH20 Pressure Support:  [5 cmH20] 5 cmH20 Plateau Pressure:  [11 cmH20-27 cmH20] 27 cmH20   Intake/Output Summary (Last 24 hours) at 08/05/2020 1830 Last data filed at 08/05/2020 1700 Gross per 24 hour  Intake 3072.66 ml  Output 2197 ml  Net 875.66 ml   Filed Weights   08/01/20 0500 08/02/20 0415 08/03/20 0400  Weight: 129.5 kg 127.9 kg 125.3 kg    Examination: General: Critically ill appearing older adult M intubated sedated NAD HEENT: ETT secure Anicteric sclera Pink mmm Neuro: Sedated.  Minimal response to painful stimuli. CV: impella p7. Cap refill < 3 sec. irreg rhythm.  PULM: Mechanically ventilated. Symmetrical chest expansion. Diminished basilar sounds. Dyssynchrony. Patient wants to take slow large breaths.  GI: Soft round ndnt  GU: CRRT Extremities: Anasarca. No acute joint deformity. No clubbing or cyanosis. L metatarsal amputation Skin: Midline sternotomy incision well approximated, c/d/healing   Labs/imaging that I have personally reviewed    CT head personally  reviewed and shows multiple areas of brainstem and cerebellar infarction.  Resolved Hospital Problem list     Assessment & Plan:   Critically ill due to acute hypoxic respiratory failure  R pleural  effusion, mild pulmonary edema  Critically ill due to cardiogenic Shock requiring mechanical cardiac support Persistent encephalopathy-multifactorial including prolonged ICU stay and sedative infusions Evidence of acute posterior circulation CVA CAD sp CABG x5 AKI Colonic ileus Anemia, post op  Thrombocytopenia - stable  Low grade pyrexia of unclear etiology PAD s/p partial L foot amputation  AAA Poorly controlled DM2 with microvascular complications.  Possible GI bleed  Plan:  -Continue current ventilator strategy -Continue cardiac support primarily with Impella. -Continue aggressive fluid removal with CRRT targeting net 100-150 mL/h - At an impasse regarding sedation management.   - Family meeting tomorrow regarding direction of care. Still requiring mechanical support so prognosis from cardiac standpoint is guarded and further complicated with possibility of significant brain injury.    Best practice   Diet:  Tube Feed  - on hold for ileus, started Reglan and neostigmine.  Pain/Anxiety/Delirium protocol (if indicated): Yes (RASS goal -1) - fentanyl prn, wean Precedex as tolerated VAP protocol (if indicated): Yes DVT prophylaxis: Systemic AC - Heparin in Impella purge. GI prophylaxis: PPI Glucose control:  SSI Yes - SSI + basal + q4  Central venous access:  Yes, and it is still needed, Arterial line:  Yes, and it is still needed -- may need to replace  Foley:  Yes, and it is no longer needed - Can remove now that on CRRT Mobility:  bed rest  PT consulted: N/A Last date of multidisciplinary goals of care discussion: I spoke with the patient's sons today 5/6 and informed them that he remains critically ill from a cardiovascular standpoint and that recovery is not guaranteed elucidate accept.  They also understand that presence of cerebral infarction would likely indicate a poor overall outcome and that this a transition to comfort care may be appropriate at that time. Code  Status:  full code Disposition: ICU  Labs   CBC: Recent Labs  Lab 08/02/20 0316 08/03/20 0337 08/04/20 0004 08/04/20 0304 08/04/20 0351 08/04/20 0800 08/04/20 0850 08/05/20 0400 08/05/20 0425 08/05/20 1401  WBC 8.4 14.5*  --  17.4*  --  18.1*  --   --  23.7*  --   NEUTROABS  --   --   --   --   --  13.2*  --   --   --   --   HGB 8.7* 9.3*   < > 8.7*   < > 9.2* 10.2* 8.5* 8.6* 8.1*  HCT 28.9* 30.6*   < > 28.6*   < > 29.8* 30.0* 25.0* 28.3* 26.8*  MCV 94.1 94.4  --  93.2  --  92.8  --   --  93.4  --   PLT 143* 143*  --  140*  --  158  --   --  154  --    < > = values in this interval not displayed.    Basic Metabolic Panel: Recent Labs  Lab 07/30/20 0331 07/30/20 0339 08/02/20 0316 08/02/20 1600 08/03/20 0337 08/03/20 1619 08/04/20 0004 08/04/20 0304 08/04/20 0351 08/04/20 0850 08/04/20 1630 08/05/20 0400 08/05/20 0425 08/05/20 1652  NA  --    < > 135   < > 136 136   < > 135   < > 137 136 135 133*  133* 133*  K  --    < >  4.7   < > 4.6 4.9   < > 4.9   < > 4.7 5.0 5.3* 5.5*  5.5* 5.7*  CL  --    < > 106   < > 101 103  --  104  --   --  104  --  102  102 101  CO2  --    < > 24   < > 26 27  --  24  --   --  24  --  '24  23 23  '$ GLUCOSE  --    < > 258*   < > 163* 173*  --  158*  --   --  55*  --  193*  192* 203*  BUN  --    < > 62*   < > 46* 40*  --  39*  --   --  42*  --  44*  43* 51*  CREATININE  --    < > 2.75*   < > 2.00* 2.01*  --  1.93*  --   --  2.19*  --  2.21*  2.17* 2.55*  CALCIUM  --    < > 7.4*   < > 7.9* 7.6*  --  7.7*  --   --  7.7*  --  7.5*  7.6* 7.7*  MG 2.3  --  2.2  --  2.5*  --   --  2.4  --   --   --   --  2.6*  --   PHOS 3.0   < > 2.2*   < > 2.7 4.2  --  3.5  --   --  3.4  --  3.6 5.5*   < > = values in this interval not displayed.   GFR: Estimated Creatinine Clearance: 38.5 mL/min (A) (by C-G formula based on SCr of 2.55 mg/dL (H)). Recent Labs  Lab 07/30/20 0331 07/31/20 0337 08/01/20 0819 08/02/20 0316 08/03/20 0337  08/04/20 0304 08/04/20 0800 08/05/20 0425  PROCALCITON 5.06  --   --   --   --   --   --   --   WBC 7.5   < >  --    < > 14.5* 17.4* 18.1* 23.7*  LATICACIDVEN  --   --  0.8  --   --   --   --   --    < > = values in this interval not displayed.    Liver Function Tests: Recent Labs  Lab 08/03/20 1619 08/04/20 0304 08/04/20 1630 08/05/20 0425 08/05/20 1652  ALBUMIN 2.2* 2.2* 2.1* 2.1* 2.1*   No results for input(s): LIPASE, AMYLASE in the last 168 hours. No results for input(s): AMMONIA in the last 168 hours.  ABG    Component Value Date/Time   PHART 7.329 (L) 08/05/2020 0400   PCO2ART 45.7 08/05/2020 0400   PO2ART 106 08/05/2020 0400   HCO3 24.0 08/05/2020 0400   TCO2 25 08/05/2020 0400   ACIDBASEDEF 2.0 08/05/2020 0400   O2SAT 98.0 08/05/2020 0400     Coagulation Profile: No results for input(s): INR, PROTIME in the last 168 hours.  Cardiac Enzymes: No results for input(s): CKTOTAL, CKMB, CKMBINDEX, TROPONINI in the last 168 hours.  HbA1C: Hgb A1c MFr Bld  Date/Time Value Ref Range Status  07/14/2020 06:28 PM 11.6 (H) 4.8 - 5.6 % Final    Comment:    (NOTE) Pre diabetes:          5.7%-6.4%  Diabetes:              >  6.4%  Glycemic control for   <7.0% adults with diabetes   08/05/2019 03:09 AM 11.6 (H) 4.8 - 5.6 % Final    Comment:    (NOTE) Pre diabetes:          5.7%-6.4% Diabetes:              >6.4% Glycemic control for   <7.0% adults with diabetes     CBG: Recent Labs  Lab 08/05/20 0345 08/05/20 0638 08/05/20 0826 08/05/20 1204 08/05/20 1650  GLUCAP 118* 141* 206* 253* 189*    CRITICAL CARE Performed by: Kipp Brood   Total critical care time: 40 minutes  Critical care time was exclusive of separately billable procedures and treating other patients. Critical care was necessary to treat or prevent imminent or life-threatening deterioration.  Critical care was time spent personally by me on the following activities: development of  treatment plan with patient and/or surrogate as well as nursing, discussions with consultants, evaluation of patient's response to treatment, examination of patient, obtaining history from patient or surrogate, ordering and performing treatments and interventions, ordering and review of laboratory studies, ordering and review of radiographic studies, pulse oximetry and re-evaluation of patient's condition.  Kipp Brood, MD [facility] ICU Physician [place]  Pager: [phone] Or Epic Secure Chat After hours: [phone].  08/05/2020, 6:30 PM      08/05/2020, 6:30 PM

## 2020-08-05 NOTE — Procedures (Signed)
Admit: 07/12/2020 LOS: 72  61M progressive AKI after CABG with prolonged shock (cardiogenic and ? Vasodilatory/septic), failure to achieve target volume status despite aggressive diuretics.    Current CRRT Prescription: Start Date: 08/01/20 Catheter: L Fem Temp IJ CCM placed 5/3 BFR: 300 Pre Blood Pump: 500 4K DFR: 1500 4K Replacement Rate: 300 4K Goal UF: 11m net neg/h Anticoagulation: systemic heparin Clotting: Has been occurring more frequently  S: Stable on CRRT, Tolerating UF K 5.5, P 3.6 CT of the head yesterday with numerous posterior bilateral ischemic strokes  O: 05/06 0701 - 05/07 0700 In: 3102.8 [I.V.:1612.2; NG/GT:1255] Out: 4191   Filed Weights   08/01/20 0500 08/02/20 0415 08/03/20 0400  Weight: 129.5 kg 127.9 kg 125.3 kg    Recent Labs  Lab 08/04/20 0304 08/04/20 0351 08/04/20 1630 08/05/20 0400 08/05/20 0425  NA 135   < > 136 135 133*  133*  K 4.9   < > 5.0 5.3* 5.5*  5.5*  CL 104  --  104  --  102  102  CO2 24  --  24  --  24  23  GLUCOSE 158*  --  55*  --  193*  192*  BUN 39*  --  42*  --  44*  43*  CREATININE 1.93*  --  2.19*  --  2.21*  2.17*  CALCIUM 7.7*  --  7.7*  --  7.5*  7.6*  PHOS 3.5  --  3.4  --  3.6   < > = values in this interval not displayed.   Recent Labs  Lab 08/04/20 0304 08/04/20 0351 08/04/20 0800 08/04/20 0850 08/05/20 0400 08/05/20 0425  WBC 17.4*  --  18.1*  --   --  23.7*  NEUTROABS  --   --  13.2*  --   --   --   HGB 8.7*   < > 9.2* 10.2* 8.5* 8.6*  HCT 28.6*   < > 29.8* 30.0* 25.0* 28.3*  MCV 93.2  --  92.8  --   --  93.4  PLT 140*  --  158  --   --  154   < > = values in this interval not displayed.    Scheduled Meds: . sodium chloride   Intravenous Once  . sodium chloride   Intravenous Once  . amiodarone  200 mg Per Tube BID  . artificial tears  1 application Both Eyes QQ7R . aspirin EC  325 mg Oral Daily   Or  . aspirin  324 mg Per Tube Daily  . B-complex with vitamin C  1 tablet Per Tube  Daily  . bisacodyl  10 mg Oral Daily   Or  . bisacodyl  10 mg Rectal Daily  . chlorhexidine gluconate (MEDLINE KIT)  15 mL Mouth Rinse BID  . Chlorhexidine Gluconate Cloth  6 each Topical Daily  . clonazepam  0.25 mg Per Tube BID  . docusate  200 mg Per Tube Daily  . feeding supplement (PROSource TF)  45 mL Per Tube QID  . HYDROmorphone HCl  2 mg Per Tube Q6H  . insulin aspart  10 Units Subcutaneous Q4H  . insulin aspart  3-9 Units Subcutaneous Q4H  . insulin detemir  29 Units Subcutaneous Q12H  . mouth rinse  15 mL Mouth Rinse 10 times per day  . metoCLOPramide (REGLAN) injection  5 mg Intravenous Q8H  . midodrine  5 mg Per Tube TID WC  . pantoprazole sodium  40 mg Per  Tube Daily  . polyethylene glycol  17 g Per Tube Daily  . QUEtiapine  25 mg Per Tube BID  . rosuvastatin  40 mg Per Tube Daily  . sodium chloride flush  10-40 mL Intracatheter Q12H  . sodium chloride flush  3 mL Intravenous Q12H   Continuous Infusions: .  prismasol BGK 4/2.5 500 mL/hr at 08/05/20 0800  .  prismasol BGK 4/2.5 300 mL/hr at 08/05/20 0352  . sodium chloride Stopped (08/01/20 1202)  . sodium chloride    . dexmedetomidine (PRECEDEX) IV infusion 1 mcg/kg/hr (08/05/20 0700)  . dextrose    . feeding supplement (VITAL 1.5 CAL) 1,000 mL (08/03/20 2003)  . fentaNYL infusion INTRAVENOUS 200 mcg/hr (08/05/20 0700)  . heparin 1,400 Units/hr (08/05/20 0720)  . impella catheter heparin 50 unit/mL in dextrose 5% 11.9 mL/hr at 07/31/20 0148  . lactated ringers    . norepinephrine (LEVOPHED) Adult infusion 1 mcg/min (08/05/20 0700)  . prismasol BGK 4/2.5 1,500 mL/hr at 08/05/20 0827   PRN Meds:.sodium chloride, Place/Maintain arterial line **AND** sodium chloride, acetaminophen, dextrose, fentaNYL, fentaNYL (SUBLIMAZE) injection, heparin, lactated ringers, metoprolol tartrate, ondansetron (ZOFRAN) IV, sodium chloride, sodium chloride flush, sodium chloride flush, traMADol  ABG    Component Value Date/Time    PHART 7.329 (L) 08/05/2020 0400   PCO2ART 45.7 08/05/2020 0400   PO2ART 106 08/05/2020 0400   HCO3 24.0 08/05/2020 0400   TCO2 25 08/05/2020 0400   ACIDBASEDEF 2.0 08/05/2020 0400   O2SAT 98.0 08/05/2020 0400    A/P  1. Dialysis dependent AKI, progressive, nonoliguric 2/2 cardiorenal syndrome probably some ATN 2. Acute systolic CHF / ICM; impella, off inotropes; on NE; massive vol overload; slowly improving 3. CAD s/p  5V CABG 4/26 4. Anemia, mild, per primary 5. DM2 6. PAD hx/o Fem Pop Bypass 7. Diuretic Resistance despite lasix gtt and metolazone 8. Fevers,  9. VDRF 10. Hypophoshatemia on CRRT, stable 11. Numerous ischemic bilateral posterior CVAs, neurology following   Change post replacement bath to 2K, continue other settings, will consider addition of fixed dose heparin if clotting continues   Recommend palliative services, new finding of posterior strokes and prolonged critical illness suggest very poor outcome    Rexene Agent, MD  [employer]

## 2020-08-05 NOTE — Progress Notes (Addendum)
Patient ID: Danny Carpenter, male   DOB: [birth year], 70 y.o.   MRN: [id number]     Advanced Heart Failure Rounding Note  PCP-Cardiologist: None   Subjective:    - 4/26 S/P CABG - 5/4 Swan removed with ongoing fevers, CVVH started  Remains intubated/sedated.   CT head on 5/6 with multiple infarcts.   On NE 1 mcg.   CVVH ongoing, weight down another 5 lbs. But continue to clot the filter. Now on 3rd filter of the day.   Having intermittent VT.  Gets agitated but will not follow commands.   Impella P7 Flow 4 No alarms Waveforms ok  LDH 209 => 247 => 215 => 247=>329=>350 => 477 => 370=>366=> 408 Position of Impella 08/03/20 adequate   Objective:   Weight Range: 125.3 kg Body mass index is 36.44 kg/m.   Vital Signs:   Temp:  [98.9 F (37.2 C)-100.1 F (37.8 C)] 98.9 F (37.2 C) (05/07 0800) Pulse Rate:  [51-82] 64 (05/07 0700) Resp:  [12-23] 20 (05/07 0700) BP: (85-125)/(62-105) 118/105 (05/07 0500) SpO2:  [92 %-100 %] 99 % (05/07 0700) Arterial Line BP: (91-189)/(60-94) 108/64 (05/07 0700) FiO2 (%):  [40 %] 40 % (05/07 1058) Last BM Date: 08/04/20  Weight change: Filed Weights   08/01/20 0500 08/02/20 0415 08/03/20 0400  Weight: 129.5 kg 127.9 kg 125.3 kg    Intake/Output:   Intake/Output Summary (Last 24 hours) at 08/05/2020 1101 Last data filed at 08/05/2020 1029 Gross per 24 hour  Intake 2908.79 ml  Output 3637 ml  Net -728.21 ml      Physical Exam   General:  Ill appearing. No resp difficulty HEENT:  + ETT Neck: supple. JVP up Carotids 2+ bilat; no bruits. No lymphadenopathy or thryomegaly appreciated. Cor: + Impella and CTs . Irregular rate & rhythm. Impella hum  Lungs: coarse Abdomen: obese soft, nontender, nondistended. No hepatosplenomegaly. No bruits or masses. Good bowel sounds. Extremities: no cyanosis, clubbing, rash, 4+ edema Neuro: intubated/sedated. unresponsive   Telemetry   atrial fibrillation 60-70s Personally reviewed   Labs     CBC Recent Labs    08/04/20 0800 08/04/20 0850 08/05/20 0400 08/05/20 0425  WBC 18.1*  --   --  23.7*  NEUTROABS 13.2*  --   --   --   HGB 9.2*   < > 8.5* 8.6*  HCT 29.8*   < > 25.0* 28.3*  MCV 92.8  --   --  93.4  PLT 158  --   --  154   < > = values in this interval not displayed.   Basic Metabolic Panel Recent Labs    08/04/20 0304 08/04/20 0351 08/04/20 1630 08/05/20 0400 08/05/20 0425  NA 135   < > 136 135 133*  133*  K 4.9   < > 5.0 5.3* 5.5*  5.5*  CL 104  --  104  --  102  102  CO2 24  --  24  --  24  23  GLUCOSE 158*  --  55*  --  193*  192*  BUN 39*  --  42*  --  44*  43*  CREATININE 1.93*  --  2.19*  --  2.21*  2.17*  CALCIUM 7.7*  --  7.7*  --  7.5*  7.6*  MG 2.4  --   --   --  2.6*  PHOS 3.5  --  3.4  --  3.6   < > = values in this interval not displayed.  Liver Function Tests Recent Labs    08/04/20 1630 08/05/20 0425  ALBUMIN 2.1* 2.1*   No results for input(s): LIPASE, AMYLASE in the last 72 hours. Cardiac Enzymes No results for input(s): CKTOTAL, CKMB, CKMBINDEX, TROPONINI in the last 72 hours.  BNP: BNP (last 3 results) Recent Labs    07/16/2020 1211  BNP 465.5*    ProBNP (last 3 results) No results for input(s): PROBNP in the last 8760 hours.   D-Dimer No results for input(s): DDIMER in the last 72 hours. Hemoglobin A1C No results for input(s): HGBA1C in the last 72 hours. Fasting Lipid Panel No results for input(s): CHOL, HDL, LDLCALC, TRIG, CHOLHDL, LDLDIRECT in the last 72 hours. Thyroid Function Tests No results for input(s): TSH, T4TOTAL, T3FREE, THYROIDAB in the last 72 hours.  Invalid input(s): FREET3  Other results:   Imaging    CT HEAD WO CONTRAST  Result Date: 08/04/2020 CLINICAL DATA:  Stroke, follow-up. EXAM: CT HEAD WITHOUT CONTRAST TECHNIQUE: Contiguous axial images were obtained from the base of the skull through the vertex without intravenous contrast. COMPARISON:  No pertinent prior exams  available for comparison. FINDINGS: Brain: Mild cerebral and cerebellar atrophy. Cortical/subcortical hypodensity measuring 2.4 x 1.7 cm within the right occipital lobe, compatible with acute/early subacute infarction (series 3, image 15). Abnormal hypodensity within the right middle cerebellar peduncle measuring 2.4 x 1.9 cm in transaxial dimensions, also likely reflecting an acute/early subacute infarct (series 3, image 11) (series 5, image 46). Small infarcts within the bilateral cerebellar hemispheres, also new as compared to the prior head CT of 03/22/2019 and likely acute/early subacute (series 3, images 7, 9, 10). Subcentimeter lacunar infarct within the left pons, also possibly acute/subacute (series 5, image 41). Chronic lacunar infarct within the right caudate nucleus, new from the prior CT. There is no acute intracranial hemorrhage. No extra-axial fluid collection. No evidence of intracranial mass. No midline shift. Vascular: No hyperdense vessel.  Atherosclerotic calcifications. Skull: Normal. Negative for fracture or focal lesion. Sinuses/Orbits: Visualized orbits show no acute finding. Large fluid level within the right maxillary sinus. Moderate fluid levels within the bilateral sphenoid sinuses. Fairly extensive partial opacification of the bilateral ethmoid air cells. Other: Bilateral mastoid effusions. These results will be called to the ordering clinician or representative by the Radiologist Assistant, and communication documented in the PACS or [employer]. IMPRESSION: Multiple posterior circulation infarcts within the bilateral cerebellum, right middle cerebellar peduncle, left pons, and cortical/subcortical right occipital lobe. These infarcts are new as compared to the head CT of 03/22/2019 and likely acute/early subacute. No evidence of hemorrhagic conversion. No significant mass effect at this time. Chronic right basal ganglia lacunar infarct, new from the prior CT. Mild generalized  parenchymal atrophy. Paranasal sinus disease and bilateral mastoid effusions in the presence of life-support tubes. Electronically Signed   By: Kellie Simmering DO   On: 08/04/2020 15:12     Medications:     Scheduled Medications: . sodium chloride   Intravenous Once  . sodium chloride   Intravenous Once  . amiodarone  200 mg Per Tube BID  . artificial tears  1 application Both Eyes F6E  . aspirin EC  325 mg Oral Daily   Or  . aspirin  324 mg Per Tube Daily  . B-complex with vitamin C  1 tablet Per Tube Daily  . bisacodyl  10 mg Oral Daily   Or  . bisacodyl  10 mg Rectal Daily  . chlorhexidine gluconate (MEDLINE KIT)  15 mL  Mouth Rinse BID  . Chlorhexidine Gluconate Cloth  6 each Topical Daily  . clonazepam  0.25 mg Per Tube BID  . docusate  200 mg Per Tube Daily  . feeding supplement (PROSource TF)  45 mL Per Tube QID  . HYDROmorphone HCl  2 mg Per Tube Q6H  . insulin aspart  10 Units Subcutaneous Q4H  . insulin aspart  3-9 Units Subcutaneous Q4H  . insulin detemir  29 Units Subcutaneous Q12H  . mouth rinse  15 mL Mouth Rinse 10 times per day  . metoCLOPramide (REGLAN) injection  5 mg Intravenous Q8H  . midodrine  5 mg Per Tube TID WC  . pantoprazole sodium  40 mg Per Tube Daily  . polyethylene glycol  17 g Per Tube Daily  . QUEtiapine  25 mg Per Tube BID  . rosuvastatin  40 mg Per Tube Daily  . sodium chloride flush  10-40 mL Intracatheter Q12H  . sodium chloride flush  3 mL Intravenous Q12H    Infusions: .  prismasol BGK 4/2.5 500 mL/hr at 08/05/20 0800  . sodium chloride Stopped (08/01/20 1202)  . sodium chloride    . dexmedetomidine (PRECEDEX) IV infusion 1 mcg/kg/hr (08/05/20 0959)  . dextrose    . feeding supplement (VITAL 1.5 CAL) 1,000 mL (08/03/20 2003)  . fentaNYL infusion INTRAVENOUS 200 mcg/hr (08/05/20 1025)  . heparin 1,400 Units/hr (08/05/20 0900)  . impella catheter heparin 50 unit/mL in dextrose 5% 11.9 mL/hr at 07/31/20 0148  . lactated ringers    .  norepinephrine (LEVOPHED) Adult infusion 1 mcg/min (08/05/20 0900)  . prismasol BGK 2/2.5 replacement solution    . prismasol BGK 4/2.5 1,500 mL/hr at 08/05/20 0827    PRN Medications: sodium chloride, Place/Maintain arterial line **AND** sodium chloride, acetaminophen, dextrose, fentaNYL, fentaNYL (SUBLIMAZE) injection, heparin, lactated ringers, metoprolol tartrate, ondansetron (ZOFRAN) IV, sodium chloride, sodium chloride flush, sodium chloride flush, traMADol  Assessment/Plan   1. Cardiogenic shock: Ischemic cardiomyopathy, post-CABG on 4/26.  He has Impella 5.5 in place, at P7 with adequate flow and no alarms.  Limited echo 4/29 with EF< 20%, the RV appears normal in size with severe systolic dysfunction. Hemodynamically stable today off NE and on Impella P7.    Started CVVH on 5/3, pulling 100 cc/hr net negative.  - Continue Impella P7  For now Continue heparin gtt. I did bedside u/s and Impella at 4.5 cm.  - Continue to pull around ~ 100 cc net negative via CVVH.  - On midodrine 5 mg tid. On NE 1 2. CAD: s/p CABG x 5 with LIMA-LAD, seq SVG-D1/ramus, seq SVG-PDA/PLV.   - ASA - Crestor 3. Anemia: Post-op bleeding, back to OR with multiple products given on 4/26 post-CABG.  Hgb stable 8.6 4. Thrombocytopenia: Plts 90K -> 112->114->128->143->143 ->140 -> 154  today, suspect low post-op/post-surgical bleeding and multiple blood products as well as sepsis.  Stable with Impella.  5. PAD: Extensive history.  6. AAA: Monitoring as outpatient.  7. Type 2 DM: Insulin.  Hgb A1c was 11.6, poor control.  8. Atrial fibrillation: Rate-controlled.  Continue per tube amiodarone for now.   9. Neuro: intubated and sedated, not following commands with sedation wean.  - Not following commands. CT head per Dr Aundra Dubin.   10. ID: Now afebrile.  PCT 7.5 on 4/29.  Currently on cefepime.  - Continue cefepime per CCM. Nochange 11. Acute hypoxemic respiratory failure: Pulmonary edema, possible PNA.  On vent,  unable to wean just yet with marked volume  overload.  May need trach if cannot extubate soon.  - Per CCM.  12. AKI:  CVVH started with marked volume overload and worsening renal function.  Still making some urine.  Avoid hypotension and excessive CVVH rates to allow future renal recovery.  13. Ileus: Seems to be resolving, tube feeds restarted.   - Continue Reglan and neostigmine.  14. Anoxic brain injury - Head CT 5/6 with multiple infarcts.   I think we have reached the point where a meaningful outcome is no longer possible. I will consult Palliative care to initiate [place] conversations.   CRITICAL CARE Performed by: Glori Bickers  Total critical care time: 45 minutes  Critical care time was exclusive of separately billable procedures and treating other patients.  Critical care was necessary to treat or prevent imminent or life-threatening deterioration.  Critical care was time spent personally by me (independent of midlevel providers or residents) on the following activities: development of treatment plan with patient and/or surrogate as well as nursing, discussions with consultants, evaluation of patient's response to treatment, examination of patient, obtaining history from patient or surrogate, ordering and performing treatments and interventions, ordering and review of laboratory studies, ordering and review of radiographic studies, pulse oximetry and re-evaluation of patient's condition.   Glori Bickers MD 08/05/2020 11:01 AM

## 2020-08-05 NOTE — Progress Notes (Signed)
Palliative Medicine Progress Note    Referral received for Danny Carpenter :goals of care discussion. Chart reviewed and updates received from [employer]. Patient assessed and is unable to engage appropriately in discussions. Attempted to contact patient's spouse Danny Carpenter. Unable to reach or leave voicemail - phone number listed is not in service.  I was able to speak with patient's daughter Danny Carpenter via phone. Briefly introduced role of palliative care and provided assistance with coordination of a multidisciplinary family meeting. Explained the importance of this meeting in the context of patient's recent strokes and implications on quality of life. She will contact her family members to determine their availability for scheduling and return my call. She also informs me that family leans heavily on patient's daughter-in-law Danny Carpenter, who is a Marine scientist and awaiting a call with updates from the medical team. I have entered her number [phone] in patient's chart for consistent communication.    I then received a call from Danny Carpenter. She is familiar with palliative care and states she prefers to meet in person for Danny Carpenter. [place] be able to meet on Sunday 5/8 or Monday 5/9. She anticipates 7 family members will be in attendance - patient's wife, 3 adult children, and their 3 spouses. She will discuss further and call me with a finalized date and time.    Detailed note and recommendations to follow once GOC has been completed.   Thank you for your referral and allowing PMT to assist in [name] care.   Danny Carpenter, [facility] Palliative Medicine Team  Team Phone # [phone]   NO CHARGE

## 2020-08-05 NOTE — Progress Notes (Signed)
ANTICOAGULATION CONSULT NOTE  Pharmacy Consult for Heparin Indication: Impella 5.5  No Known Allergies  Patient Measurements: Height: '6\' 1"'$  (185.4 cm) Weight: 125.3 kg (276 lb 3.8 oz) IBW/kg (Calculated) : 79.9 Heparin Dosing Weight: 102.8 kg  Vital Signs: Temp: 99 F (37.2 C) (05/07 1200) Temp Source: Oral (05/07 1200) Pulse Rate: 64 (05/07 0700)  Labs: Recent Labs    08/04/20 0304 08/04/20 0351 08/04/20 0800 08/04/20 0850 08/04/20 1630 08/04/20 1815 08/05/20 0400 08/05/20 0425 08/05/20 0430 08/05/20 1401 08/05/20 1652  HGB 8.7*   < > 9.2*   < >  --   --  8.5* 8.6*  --  8.1*  --   HCT 28.6*   < > 29.8*   < >  --   --  25.0* 28.3*  --  26.8*  --   PLT 140*  --  158  --   --   --   --  154  --   --   --   HEPARINUNFRC 0.19*  --   --   --   --  0.26*  --   --  0.16*  --  0.18*  CREATININE 1.93*  --   --   --  2.19*  --   --  2.21*  2.17*  --   --  2.55*   < > = values in this interval not displayed.    Estimated Creatinine Clearance: 38.5 mL/min (A) (by C-G formula based on SCr of 2.55 mg/dL (H)).   Assessment: 70 yo M presents with NSTEMI and multivessel CAD, now s/p CABG with Impella support. Pharmacy asked to manage systemic heparin.  Heparin level is subtherapeutic at 0.18, bloody sputum has resolved but now with some melena.  Goal of Therapy:  Heparin level 0.2-0.3 units/ml Monitor platelets by anticoagulation protocol: Yes   Plan:  Continue heparin purge solution (50 units/ml) Increase IV heparin to 1450 units/hr Check q12h heparin level for now   Arrie Senate, PharmD, BCPS, [facility] Clinical Pharmacist [phone] Please check AMION for all [place] numbers 08/05/2020

## 2020-08-05 NOTE — Progress Notes (Addendum)
ANTICOAGULATION CONSULT NOTE  Pharmacy Consult for Heparin Indication: Impella 5.5  No Known Allergies  Patient Measurements: Height: '6\' 1"'$  (185.4 cm) Weight: 125.3 kg (276 lb 3.8 oz) IBW/kg (Calculated) : 79.9 Heparin Dosing Weight: 102.8 kg  Vital Signs: Temp: 98.9 F (37.2 C) (05/07 0347) Temp Source: Oral (05/07 0347) BP: 118/105 (05/07 0500) Pulse Rate: 64 (05/07 0700)  Labs: Recent Labs    08/04/20 0304 08/04/20 0351 08/04/20 0800 08/04/20 0850 08/04/20 1630 08/04/20 1815 08/05/20 0400 08/05/20 0425 08/05/20 0430  HGB 8.7*   < > 9.2* 10.2*  --   --  8.5* 8.6*  --   HCT 28.6*   < > 29.8* 30.0*  --   --  25.0* 28.3*  --   PLT 140*  --  158  --   --   --   --  154  --   HEPARINUNFRC 0.19*  --   --   --   --  0.26*  --   --  0.16*  CREATININE 1.93*  --   --   --  2.19*  --   --  2.21*  2.17*  --    < > = values in this interval not displayed.    Estimated Creatinine Clearance: 44.4 mL/min (A) (by C-G formula based on SCr of 2.21 mg/dL (H)).   Assessment: 70 yo M presents with NSTEMI and multivessel CAD, now s/p CABG with Impella support. Pharmacy asked to manage systemic heparin.  Heparin level is subtherapeutic at 0.16, on 1350 units/hr. Hgb 8.6, plt 154. LDH up slightly to 408. Purge solution is running at 11.7 mL/hr (585units/hr) - pressures in 500s, on P7. No infusion issues. Did have bloody sputum when suctioning which is new - will monitor closely.  Goal of Therapy:  Heparin level 0.2-0.3 units/ml Monitor platelets by anticoagulation protocol: Yes   Plan:  Continue heparin purge solution (50 units/ml) Increase IV heparin to 1400 units/hr Check q12h heparin level for now  Antonietta Jewel, PharmD, [place] Pharmacist  Phone: [phone] 08/05/2020 7:16 AM  Please check AMION for all [place] phone numbers After 10:00 PM, call [place] [phone]

## 2020-08-06 DIAGNOSIS — Z9889 Other specified postprocedural states: Secondary | ICD-10-CM

## 2020-08-06 LAB — POCT I-STAT, CHEM 8
BUN: 42 mg/dL — ABNORMAL HIGH (ref 8–23)
BUN: 41 mg/dL — ABNORMAL HIGH (ref 8–23)
BUN: 41 mg/dL — ABNORMAL HIGH (ref 8–23)
BUN: 38 mg/dL — ABNORMAL HIGH (ref 8–23)
BUN: 46 mg/dL — ABNORMAL HIGH (ref 8–23)
BUN: 38 mg/dL — ABNORMAL HIGH (ref 8–23)
BUN: 46 mg/dL — ABNORMAL HIGH (ref 8–23)
BUN: 37 mg/dL — ABNORMAL HIGH (ref 8–23)
BUN: 42 mg/dL — ABNORMAL HIGH (ref 8–23)
BUN: 37 mg/dL — ABNORMAL HIGH (ref 8–23)
BUN: 34 mg/dL — ABNORMAL HIGH (ref 8–23)
Calcium, Ion: 0.5 mmol/L — CL (ref 1.15–1.40)
Calcium, Ion: 0.49 mmol/L — CL (ref 1.15–1.40)
Calcium, Ion: 0.69 mmol/L — CL (ref 1.15–1.40)
Calcium, Ion: 0.4 mmol/L — CL (ref 1.15–1.40)
Calcium, Ion: 1.06 mmol/L — ABNORMAL LOW (ref 1.15–1.40)
Calcium, Ion: 0.4 mmol/L — CL (ref 1.15–1.40)
Calcium, Ion: 1.08 mmol/L — ABNORMAL LOW (ref 1.15–1.40)
Calcium, Ion: 0.42 mmol/L — CL (ref 1.15–1.40)
Calcium, Ion: 1.13 mmol/L — ABNORMAL LOW (ref 1.15–1.40)
Calcium, Ion: 0.45 mmol/L — CL (ref 1.15–1.40)
Calcium, Ion: 0.37 mmol/L — CL (ref 1.15–1.40)
Chloride: 93 mmol/L — ABNORMAL LOW (ref 98–111)
Chloride: 93 mmol/L — ABNORMAL LOW (ref 98–111)
Chloride: 92 mmol/L — ABNORMAL LOW (ref 98–111)
Chloride: 91 mmol/L — ABNORMAL LOW (ref 98–111)
Chloride: 94 mmol/L — ABNORMAL LOW (ref 98–111)
Chloride: 90 mmol/L — ABNORMAL LOW (ref 98–111)
Chloride: 94 mmol/L — ABNORMAL LOW (ref 98–111)
Chloride: 89 mmol/L — ABNORMAL LOW (ref 98–111)
Chloride: 91 mmol/L — ABNORMAL LOW (ref 98–111)
Chloride: 88 mmol/L — ABNORMAL LOW (ref 98–111)
Chloride: 90 mmol/L — ABNORMAL LOW (ref 98–111)
Creatinine, Ser: 1.8 mg/dL — ABNORMAL HIGH (ref 0.61–1.24)
Creatinine, Ser: 1.8 mg/dL — ABNORMAL HIGH (ref 0.61–1.24)
Creatinine, Ser: 1.9 mg/dL — ABNORMAL HIGH (ref 0.61–1.24)
Creatinine, Ser: 1.8 mg/dL — ABNORMAL HIGH (ref 0.61–1.24)
Creatinine, Ser: 2.2 mg/dL — ABNORMAL HIGH (ref 0.61–1.24)
Creatinine, Ser: 1.7 mg/dL — ABNORMAL HIGH (ref 0.61–1.24)
Creatinine, Ser: 2.1 mg/dL — ABNORMAL HIGH (ref 0.61–1.24)
Creatinine, Ser: 1.7 mg/dL — ABNORMAL HIGH (ref 0.61–1.24)
Creatinine, Ser: 2.1 mg/dL — ABNORMAL HIGH (ref 0.61–1.24)
Creatinine, Ser: 1.8 mg/dL — ABNORMAL HIGH (ref 0.61–1.24)
Creatinine, Ser: 1.6 mg/dL — ABNORMAL HIGH (ref 0.61–1.24)
Glucose, Bld: 173 mg/dL — ABNORMAL HIGH (ref 70–99)
Glucose, Bld: 170 mg/dL — ABNORMAL HIGH (ref 70–99)
Glucose, Bld: 157 mg/dL — ABNORMAL HIGH (ref 70–99)
Glucose, Bld: 156 mg/dL — ABNORMAL HIGH (ref 70–99)
Glucose, Bld: 185 mg/dL — ABNORMAL HIGH (ref 70–99)
Glucose, Bld: 201 mg/dL — ABNORMAL HIGH (ref 70–99)
Glucose, Bld: 222 mg/dL — ABNORMAL HIGH (ref 70–99)
Glucose, Bld: 223 mg/dL — ABNORMAL HIGH (ref 70–99)
Glucose, Bld: 244 mg/dL — ABNORMAL HIGH (ref 70–99)
Glucose, Bld: 220 mg/dL — ABNORMAL HIGH (ref 70–99)
Glucose, Bld: 200 mg/dL — ABNORMAL HIGH (ref 70–99)
HCT: 27 % — ABNORMAL LOW (ref 39.0–52.0)
HCT: 27 % — ABNORMAL LOW (ref 39.0–52.0)
HCT: 29 % — ABNORMAL LOW (ref 39.0–52.0)
HCT: 25 % — ABNORMAL LOW (ref 39.0–52.0)
HCT: 28 % — ABNORMAL LOW (ref 39.0–52.0)
HCT: 24 % — ABNORMAL LOW (ref 39.0–52.0)
HCT: 27 % — ABNORMAL LOW (ref 39.0–52.0)
HCT: 24 % — ABNORMAL LOW (ref 39.0–52.0)
HCT: 27 % — ABNORMAL LOW (ref 39.0–52.0)
HCT: 28 % — ABNORMAL LOW (ref 39.0–52.0)
HCT: 28 % — ABNORMAL LOW (ref 39.0–52.0)
Hemoglobin: 9.2 g/dL — ABNORMAL LOW (ref 13.0–17.0)
Hemoglobin: 9.2 g/dL — ABNORMAL LOW (ref 13.0–17.0)
Hemoglobin: 9.9 g/dL — ABNORMAL LOW (ref 13.0–17.0)
Hemoglobin: 8.5 g/dL — ABNORMAL LOW (ref 13.0–17.0)
Hemoglobin: 9.5 g/dL — ABNORMAL LOW (ref 13.0–17.0)
Hemoglobin: 8.2 g/dL — ABNORMAL LOW (ref 13.0–17.0)
Hemoglobin: 9.2 g/dL — ABNORMAL LOW (ref 13.0–17.0)
Hemoglobin: 8.2 g/dL — ABNORMAL LOW (ref 13.0–17.0)
Hemoglobin: 9.2 g/dL — ABNORMAL LOW (ref 13.0–17.0)
Hemoglobin: 9.5 g/dL — ABNORMAL LOW (ref 13.0–17.0)
Hemoglobin: 9.5 g/dL — ABNORMAL LOW (ref 13.0–17.0)
Potassium: 4.6 mmol/L (ref 3.5–5.1)
Potassium: 4.6 mmol/L (ref 3.5–5.1)
Potassium: 4.8 mmol/L (ref 3.5–5.1)
Potassium: 4.5 mmol/L (ref 3.5–5.1)
Potassium: 4.9 mmol/L (ref 3.5–5.1)
Potassium: 4.5 mmol/L (ref 3.5–5.1)
Potassium: 4.8 mmol/L (ref 3.5–5.1)
Potassium: 4.5 mmol/L (ref 3.5–5.1)
Potassium: 4.6 mmol/L (ref 3.5–5.1)
Potassium: 4.4 mmol/L (ref 3.5–5.1)
Potassium: 4.3 mmol/L (ref 3.5–5.1)
Sodium: 138 mmol/L (ref 135–145)
Sodium: 138 mmol/L (ref 135–145)
Sodium: 137 mmol/L (ref 135–145)
Sodium: 136 mmol/L (ref 135–145)
Sodium: 138 mmol/L (ref 135–145)
Sodium: 136 mmol/L (ref 135–145)
Sodium: 138 mmol/L (ref 135–145)
Sodium: 136 mmol/L (ref 135–145)
Sodium: 138 mmol/L (ref 135–145)
Sodium: 138 mmol/L (ref 135–145)
Sodium: 138 mmol/L (ref 135–145)
TCO2: 27 mmol/L (ref 22–32)
TCO2: 29 mmol/L (ref 22–32)
TCO2: 29 mmol/L (ref 22–32)
TCO2: 30 mmol/L (ref 22–32)
TCO2: 30 mmol/L (ref 22–32)
TCO2: 31 mmol/L (ref 22–32)
TCO2: 32 mmol/L (ref 22–32)
TCO2: 30 mmol/L (ref 22–32)
TCO2: 32 mmol/L (ref 22–32)
TCO2: 33 mmol/L — ABNORMAL HIGH (ref 22–32)
TCO2: 32 mmol/L (ref 22–32)

## 2020-08-06 LAB — COOXEMETRY PANEL
Carboxyhemoglobin: 1.5 % (ref 0.5–1.5)
Methemoglobin: 1.2 % (ref 0.0–1.5)
O2 Saturation: 59.5 %
Total hemoglobin: 7.1 g/dL — ABNORMAL LOW (ref 12.0–16.0)

## 2020-08-06 LAB — GLUCOSE, CAPILLARY
Glucose-Capillary: 129 mg/dL — ABNORMAL HIGH (ref 70–99)
Glucose-Capillary: 137 mg/dL — ABNORMAL HIGH (ref 70–99)
Glucose-Capillary: 177 mg/dL — ABNORMAL HIGH (ref 70–99)
Glucose-Capillary: 185 mg/dL — ABNORMAL HIGH (ref 70–99)
Glucose-Capillary: 209 mg/dL — ABNORMAL HIGH (ref 70–99)
Glucose-Capillary: 222 mg/dL — ABNORMAL HIGH (ref 70–99)

## 2020-08-06 LAB — RENAL FUNCTION PANEL
Albumin: 2.2 g/dL — ABNORMAL LOW (ref 3.5–5.0)
Albumin: 2.1 g/dL — ABNORMAL LOW (ref 3.5–5.0)
Anion gap: 10 (ref 5–15)
Anion gap: 12 (ref 5–15)
BUN: 46 mg/dL — ABNORMAL HIGH (ref 8–23)
BUN: 43 mg/dL — ABNORMAL HIGH (ref 8–23)
CO2: 29 mmol/L (ref 22–32)
CO2: 31 mmol/L (ref 22–32)
Calcium: 8.5 mg/dL — ABNORMAL LOW (ref 8.9–10.3)
Calcium: 8.9 mg/dL (ref 8.9–10.3)
Chloride: 97 mmol/L — ABNORMAL LOW (ref 98–111)
Chloride: 93 mmol/L — ABNORMAL LOW (ref 98–111)
Creatinine, Ser: 2.28 mg/dL — ABNORMAL HIGH (ref 0.61–1.24)
Creatinine, Ser: 2.3 mg/dL — ABNORMAL HIGH (ref 0.61–1.24)
GFR, Estimated: 30 mL/min — ABNORMAL LOW (ref >60)
GFR, Estimated: 30 mL/min — ABNORMAL LOW (ref >60)
Glucose, Bld: 134 mg/dL — ABNORMAL HIGH (ref 70–99)
Glucose, Bld: 226 mg/dL — ABNORMAL HIGH (ref 70–99)
Phosphorus: 4.1 mg/dL (ref 2.5–4.6)
Phosphorus: 3.9 mg/dL (ref 2.5–4.6)
Potassium: 4.9 mmol/L (ref 3.5–5.1)
Potassium: 4.7 mmol/L (ref 3.5–5.1)
Sodium: 136 mmol/L (ref 135–145)
Sodium: 136 mmol/L (ref 135–145)

## 2020-08-06 LAB — BASIC METABOLIC PANEL
Anion gap: 11 (ref 5–15)
BUN: 45 mg/dL — ABNORMAL HIGH (ref 8–23)
CO2: 28 mmol/L (ref 22–32)
Calcium: 8.6 mg/dL — ABNORMAL LOW (ref 8.9–10.3)
Chloride: 96 mmol/L — ABNORMAL LOW (ref 98–111)
Creatinine, Ser: 2.28 mg/dL — ABNORMAL HIGH (ref 0.61–1.24)
GFR, Estimated: 30 mL/min — ABNORMAL LOW (ref >60)
Glucose, Bld: 133 mg/dL — ABNORMAL HIGH (ref 70–99)
Potassium: 4.8 mmol/L (ref 3.5–5.1)
Sodium: 135 mmol/L (ref 135–145)

## 2020-08-06 LAB — POCT I-STAT 7, (LYTES, BLD GAS, ICA,H+H)
Acid-Base Excess: 2 mmol/L (ref 0.0–2.0)
Acid-Base Excess: 2 mmol/L (ref 0.0–2.0)
Acid-Base Excess: 4 mmol/L — ABNORMAL HIGH (ref 0.0–2.0)
Acid-Base Excess: 5 mmol/L — ABNORMAL HIGH (ref 0.0–2.0)
Acid-Base Excess: 12 mmol/L — ABNORMAL HIGH (ref 0.0–2.0)
Acid-Base Excess: 13 mmol/L — ABNORMAL HIGH (ref 0.0–2.0)
Bicarbonate: 28.3 mmol/L — ABNORMAL HIGH (ref 20.0–28.0)
Bicarbonate: 28.3 mmol/L — ABNORMAL HIGH (ref 20.0–28.0)
Bicarbonate: 29.9 mmol/L — ABNORMAL HIGH (ref 20.0–28.0)
Bicarbonate: 31 mmol/L — ABNORMAL HIGH (ref 20.0–28.0)
Bicarbonate: 37 mmol/L — ABNORMAL HIGH (ref 20.0–28.0)
Bicarbonate: 38.7 mmol/L — ABNORMAL HIGH (ref 20.0–28.0)
Calcium, Ion: 1.09 mmol/L — ABNORMAL LOW (ref 1.15–1.40)
Calcium, Ion: 1.06 mmol/L — ABNORMAL LOW (ref 1.15–1.40)
Calcium, Ion: 1.1 mmol/L — ABNORMAL LOW (ref 1.15–1.40)
Calcium, Ion: 1.09 mmol/L — ABNORMAL LOW (ref 1.15–1.40)
Calcium, Ion: 1.05 mmol/L — ABNORMAL LOW (ref 1.15–1.40)
Calcium, Ion: 1.06 mmol/L — ABNORMAL LOW (ref 1.15–1.40)
HCT: 23 % — ABNORMAL LOW (ref 39.0–52.0)
HCT: 23 % — ABNORMAL LOW (ref 39.0–52.0)
HCT: 26 % — ABNORMAL LOW (ref 39.0–52.0)
HCT: 25 % — ABNORMAL LOW (ref 39.0–52.0)
HCT: 25 % — ABNORMAL LOW (ref 39.0–52.0)
HCT: 25 % — ABNORMAL LOW (ref 39.0–52.0)
Hemoglobin: 7.8 g/dL — ABNORMAL LOW (ref 13.0–17.0)
Hemoglobin: 7.8 g/dL — ABNORMAL LOW (ref 13.0–17.0)
Hemoglobin: 8.8 g/dL — ABNORMAL LOW (ref 13.0–17.0)
Hemoglobin: 8.5 g/dL — ABNORMAL LOW (ref 13.0–17.0)
Hemoglobin: 8.5 g/dL — ABNORMAL LOW (ref 13.0–17.0)
Hemoglobin: 8.5 g/dL — ABNORMAL LOW (ref 13.0–17.0)
O2 Saturation: 99 %
O2 Saturation: 98 %
O2 Saturation: 97 %
O2 Saturation: 99 %
O2 Saturation: 99 %
O2 Saturation: 99 %
Patient temperature: 98.7
Patient temperature: 98.7
Patient temperature: 99.3
Patient temperature: 99.3
Patient temperature: 99.7
Patient temperature: 100.2
Potassium: 4.9 mmol/L (ref 3.5–5.1)
Potassium: 4.7 mmol/L (ref 3.5–5.1)
Potassium: 4.9 mmol/L (ref 3.5–5.1)
Potassium: 4.7 mmol/L (ref 3.5–5.1)
Potassium: 4.7 mmol/L (ref 3.5–5.1)
Potassium: 4.6 mmol/L (ref 3.5–5.1)
Sodium: 136 mmol/L (ref 135–145)
Sodium: 137 mmol/L (ref 135–145)
Sodium: 136 mmol/L (ref 135–145)
Sodium: 136 mmol/L (ref 135–145)
Sodium: 136 mmol/L (ref 135–145)
Sodium: 137 mmol/L (ref 135–145)
TCO2: 30 mmol/L (ref 22–32)
TCO2: 30 mmol/L (ref 22–32)
TCO2: 31 mmol/L (ref 22–32)
TCO2: 33 mmol/L — ABNORMAL HIGH (ref 22–32)
TCO2: 39 mmol/L — ABNORMAL HIGH (ref 22–32)
TCO2: 40 mmol/L — ABNORMAL HIGH (ref 22–32)
pCO2 arterial: 53.8 mmHg — ABNORMAL HIGH (ref 32.0–48.0)
pCO2 arterial: 51.5 mmHg — ABNORMAL HIGH (ref 32.0–48.0)
pCO2 arterial: 53.1 mmHg — ABNORMAL HIGH (ref 32.0–48.0)
pCO2 arterial: 53.5 mmHg — ABNORMAL HIGH (ref 32.0–48.0)
pCO2 arterial: 53 mmHg — ABNORMAL HIGH (ref 32.0–48.0)
pCO2 arterial: 56.2 mmHg — ABNORMAL HIGH (ref 32.0–48.0)
pH, Arterial: 7.329 — ABNORMAL LOW (ref 7.350–7.450)
pH, Arterial: 7.349 — ABNORMAL LOW (ref 7.350–7.450)
pH, Arterial: 7.36 (ref 7.350–7.450)
pH, Arterial: 7.373 (ref 7.350–7.450)
pH, Arterial: 7.454 — ABNORMAL HIGH (ref 7.350–7.450)
pH, Arterial: 7.449 (ref 7.350–7.450)
pO2, Arterial: 142 mmHg — ABNORMAL HIGH (ref 83.0–108.0)
pO2, Arterial: 113 mmHg — ABNORMAL HIGH (ref 83.0–108.0)
pO2, Arterial: 94 mmHg (ref 83.0–108.0)
pO2, Arterial: 125 mmHg — ABNORMAL HIGH (ref 83.0–108.0)
pO2, Arterial: 127 mmHg — ABNORMAL HIGH (ref 83.0–108.0)
pO2, Arterial: 123 mmHg — ABNORMAL HIGH (ref 83.0–108.0)

## 2020-08-06 LAB — CBC
HCT: 25.8 % — ABNORMAL LOW (ref 39.0–52.0)
Hemoglobin: 7.9 g/dL — ABNORMAL LOW (ref 13.0–17.0)
MCH: 28.8 pg (ref 26.0–34.0)
MCHC: 30.6 g/dL (ref 30.0–36.0)
MCV: 94.2 fL (ref 80.0–100.0)
Platelets: 175 10*3/uL (ref 150–400)
RBC: 2.74 MIL/uL — ABNORMAL LOW (ref 4.22–5.81)
RDW: 17.8 % — ABNORMAL HIGH (ref 11.5–15.5)
WBC: 21.2 10*3/uL — ABNORMAL HIGH (ref 4.0–10.5)
nRBC: 0.5 % — ABNORMAL HIGH (ref 0.0–0.2)

## 2020-08-06 LAB — HEPARIN LEVEL (UNFRACTIONATED)
Heparin Unfractionated: 0.22 IU/mL — ABNORMAL LOW (ref 0.30–0.70)
Heparin Unfractionated: 0.12 IU/mL — ABNORMAL LOW (ref 0.30–0.70)

## 2020-08-06 LAB — LACTATE DEHYDROGENASE: LDH: 324 U/L — ABNORMAL HIGH (ref 98–192)

## 2020-08-06 LAB — MAGNESIUM: Magnesium: 2.4 mg/dL (ref 1.7–2.4)

## 2020-08-06 MED ORDER — LEVALBUTEROL HCL 0.63 MG/3ML IN NEBU
0.6300 mg | INHALATION_SOLUTION | Freq: Four times a day (QID) | RESPIRATORY_TRACT | Status: DC
  Administered 2020-08-07 – 2020-08-10 (×14): 0.63 mg via RESPIRATORY_TRACT
  Filled 2020-08-06 (×15): qty 3

## 2020-08-06 NOTE — Progress Notes (Signed)
Patient ID: Danny Carpenter, male   DOB: 10/21/1950, 70 y.o.   MRN: [id number]     Advanced Heart Failure Rounding Note  PCP-Cardiologist: None   Subjective:    - 4/26 S/P CABG - 5/4 Swan removed with ongoing fevers, CVVH started - CT head on 5/6 with multiple posterior circulation infarcts.   Remains intubated/sedated.   Off NE.   CVVH ongoing, weight down, pulling up to 150 cc/hr net negative. CVP 18.   Gets agitated but will not follow commands.   Afebrile, WBCs still high at 21, off cefepime.   Impella P7 Flow 3.9 No alarms Waveforms ok  LDH 209 => 247 => 215 => 247=>329=>350 => 477 => 370=>366=> 408 => 324 Position of Impella 08/03/20 adequate   Objective:   Weight Range: 120.9 kg Body mass index is 35.17 kg/m.   Vital Signs:   Temp:  [98.7 F (37.1 C)-99.5 F (37.5 C)] 99 F (37.2 C) (05/08 0430) Pulse Rate:  [46-85] 66 (05/08 0715) Resp:  [7-30] 17 (05/08 0715) BP: (95-169)/(52-114) 138/68 (05/08 0700) SpO2:  [77 %-100 %] 100 % (05/08 0715) Arterial Line BP: (80-198)/(46-96) 140/58 (05/08 0715) FiO2 (%):  [40 %] 40 % (05/08 0709) Weight:  [120.9 kg] 120.9 kg (05/08 0615) Last BM Date: 08/05/20  Weight change: Filed Weights   08/02/20 0415 08/03/20 0400 08/06/20 0615  Weight: 127.9 kg 125.3 kg 120.9 kg    Intake/Output:   Intake/Output Summary (Last 24 hours) at 08/06/2020 0727 Last data filed at 08/06/2020 0700 Gross per 24 hour  Intake 4418.54 ml  Output 5219 ml  Net -800.46 ml      Physical Exam   General: Eyes open, not responsive, on vent.  Neck: Thick neck, JVP elevated  Lungs: Decreased BS at bases. CV: Nondisplaced PMI.  Heart regular S1/S2, no S3/S4, no murmur.  2+ edema to knees. Abdomen: Soft, nontender, no hepatosplenomegaly, no distention.  Skin: Intact without lesions or rashes.  Neurologic: Not responsive.  Extremities: No clubbing or cyanosis.  HEENT: Normal.    Telemetry   Atrial fibrillation 70s Personally  reviewed   Labs    CBC Recent Labs    08/04/20 0800 08/04/20 0850 08/05/20 0425 08/05/20 1401 08/06/20 0347 08/06/20 0348 08/06/20 0622 08/06/20 0624  WBC 18.1*  --  23.7*  --  21.2*  --   --   --   NEUTROABS 13.2*  --   --   --   --   --   --   --   HGB 9.2*   < > 8.6*   < > 7.9*   < > 9.9* 8.5*  HCT 29.8*   < > 28.3*   < > 25.8*   < > 29.0* 25.0*  MCV 92.8  --  93.4  --  94.2  --   --   --   PLT 158  --  154  --  175  --   --   --    < > = values in this interval not displayed.   Basic Metabolic Panel Recent Labs    08/05/20 0425 08/05/20 1652 08/05/20 1903 08/06/20 0347 08/06/20 0348 08/06/20 0622 08/06/20 0624  NA 133*  133* 133*   < > 136  135   < > 137 136  K 5.5*  5.5* 5.7*   < > 4.9  4.8   < > 4.8 4.7  CL 102  102 101   < > 97*  96*  --  92*  --  CO2 _0 --  29  28  --   --   --   GLUCOSE 193*  192* 203*   < > 134*  133*  --  157*  --   BUN 44*  43* 51*   < > 46*  45*  --  41*  --   CREATININE 2.21*  2.17* 2.55*   < > 2.28*  2.28*  --  1.90*  --   CALCIUM 7.5*  7.6* 7.7*  --  8.5*  8.6*  --   --   --   MG 2.6*  --   --  2.4  --   --   --   PHOS 3.6 5.5*  --  4.1  --   --   --    < > = values in this interval not displayed.   Liver Function Tests Recent Labs    08/05/20 1652 08/06/20 0347  ALBUMIN 2.1* 2.2*   No results for input(s): LIPASE, AMYLASE in the last 72 hours. Cardiac Enzymes No results for input(s): CKTOTAL, CKMB, CKMBINDEX, TROPONINI in the last 72 hours.  BNP: BNP (last 3 results) Recent Labs    07/13/2020 1211  BNP 465.5*    ProBNP (last 3 results) No results for input(s): PROBNP in the last 8760 hours.   D-Dimer No results for input(s): DDIMER in the last 72 hours. Hemoglobin A1C No results for input(s): HGBA1C in the last 72 hours. Fasting Lipid Panel No results for input(s): CHOL, HDL, LDLCALC, TRIG, CHOLHDL, LDLDIRECT in the last 72 hours. Thyroid Function Tests No results for input(s): TSH,  T4TOTAL, T3FREE, THYROIDAB in the last 72 hours.  Invalid input(s): FREET3  Other results:   Imaging    No results found.   Medications:     Scheduled Medications: . artificial tears  1 application Both Eyes U9N  . aspirin EC  325 mg Oral Daily   Or  . aspirin  324 mg Per Tube Daily  . B-complex with vitamin C  1 tablet Per Tube Daily  . bisacodyl  10 mg Oral Daily   Or  . bisacodyl  10 mg Rectal Daily  . chlorhexidine gluconate (MEDLINE KIT)  15 mL Mouth Rinse BID  . Chlorhexidine Gluconate Cloth  6 each Topical Daily  . clonazepam  0.25 mg Per Tube BID  . docusate  200 mg Per Tube Daily  . feeding supplement (PROSource TF)  45 mL Per Tube QID  . HYDROmorphone HCl  2 mg Per Tube Q6H  . insulin aspart  10 Units Subcutaneous Q4H  . insulin aspart  3-9 Units Subcutaneous Q4H  . insulin detemir  29 Units Subcutaneous Q12H  . mouth rinse  15 mL Mouth Rinse 10 times per day  . metoCLOPramide (REGLAN) injection  5 mg Intravenous Q8H  . midodrine  5 mg Per Tube TID WC  . pantoprazole sodium  40 mg Per Tube Daily  . polyethylene glycol  17 g Per Tube Daily  . QUEtiapine  25 mg Per Tube BID  . rosuvastatin  40 mg Per Tube Daily  . sodium chloride flush  10-40 mL Intracatheter Q12H    Infusions: .  prismasol BGK 4/2.5 500 mL/hr at 08/05/20 0800  . sodium chloride Stopped (08/01/20 1202)  . sodium chloride    . amiodarone 30 mg/hr (08/06/20 0700)  . calcium gluconate infusion for CRRT 20 g (08/05/20 1705)  . citrate dextrose 320 mL/hr at 08/06/20 2355  . dexmedetomidine (  PRECEDEX) IV infusion 0.4 mcg/kg/hr (08/06/20 0700)  . dextrose    . feeding supplement (VITAL 1.5 CAL) 1,000 mL (08/06/20 0413)  . fentaNYL infusion INTRAVENOUS 100 mcg/hr (08/06/20 0700)  . heparin 1,450 Units/hr (08/06/20 0700)  . impella catheter heparin 50 unit/mL in dextrose 5% 11.9 mL/hr at 07/31/20 0148  . norepinephrine (LEVOPHED) Adult infusion Stopped (08/06/20 0522)  . prismasol BGK 2/2.5  replacement solution 300 mL/hr at 08/05/20 1700  . prismasol BGK 4/2.5 1,500 mL/hr at 08/06/20 0616    PRN Medications: sodium chloride, Place/Maintain arterial line **AND** sodium chloride, acetaminophen, dextrose, fentaNYL, fentaNYL (SUBLIMAZE) injection, heparin, metoprolol tartrate, ondansetron (ZOFRAN) IV, sodium chloride, sodium chloride flush, traMADol  Assessment/Plan   1. Cardiogenic shock: Ischemic cardiomyopathy, post-CABG on 4/26.  He has Impella 5.5 in place, at P7 with adequate flow and no alarms.  Limited echo 4/29 with EF< 20%, the RV appears normal in size with severe systolic dysfunction. Hemodynamically stable today off NE and on Impella P7.  CVP 18, volume overloaded.  - Continue CVVH, pulling 100-150 cc/hr net UF.  - Continue Impella P7, on heparin gtt.   - On midodrine 5 mg tid, off NE.  2. CAD: s/p CABG x 5 with LIMA-LAD, seq SVG-D1/ramus, seq SVG-PDA/PLV.   - ASA - Crestor 3. Anemia: Post-op bleeding, back to OR with multiple products given on 4/26 post-CABG.  Hgb stable 8.6 4. Thrombocytopenia: Resolved. suspect low post-op/post-surgical bleeding and multiple blood products as well as sepsis.  Stable with Impella.  5. PAD: Extensive history.  6. AAA: Monitoring as outpatient.  7. Type 2 DM: Insulin.  Hgb A1c was 11.6, poor control.  8. Atrial fibrillation: Rate-controlled.  Continue per tube amiodarone for now.   9. Neuro: intubated and sedated, not following commands with sedation wean.  Head CT 5/6 with multiple posterior circulation infarcts.   - Palliative care following, goals of care meeting with family planned.   10. ID: Now afebrile.  PCT 7.5 on 4/29. WBCs elevated.  Has completed cefepime.  11. Acute hypoxemic respiratory failure: Pulmonary edema, possible PNA.  On vent, unable to wean just yet with marked volume overload.   - Per CCM.  12. AKI:  CVVH started with marked volume overload and worsening renal function.  Still making some urine.  Avoid  hypotension and excessive CVVH rates to allow future renal recovery.  13. Ileus: Seems to be resolving, tube feeds restarted.   - Continue Reglan and neostigmine.    I think we have reached the point where a meaningful outcome is no longer possible. Palliative care consulted to initiate [facility] conversations.   CRITICAL CARE Performed by: Loralie Champagne  Total critical care time: 40 minutes  Critical care time was exclusive of separately billable procedures and treating other patients.  Critical care was necessary to treat or prevent imminent or life-threatening deterioration.  Critical care was time spent personally by me (independent of midlevel providers or residents) on the following activities: development of treatment plan with patient and/or surrogate as well as nursing, discussions with consultants, evaluation of patient's response to treatment, examination of patient, obtaining history from patient or surrogate, ordering and performing treatments and interventions, ordering and review of laboratory studies, ordering and review of radiographic studies, pulse oximetry and re-evaluation of patient's condition.   Loralie Champagne MD 08/06/2020 7:27 AM

## 2020-08-06 NOTE — Progress Notes (Signed)
Danny Carpenter, MRN:  [id number], DOB:  11-23-1950, LOS: 78 ADMISSION DATE:  07/09/2020, CONSULTATION DATE: 07/28/2018 REFERRING MD: Dr. Orvan Seen, CHIEF COMPLAINT: Ventilator dependent  History of Present Illness:  Danny Carpenter is a 70 year old male with past medical history significant for hypertension, hyperlipidemia, type 2 diabetes, CVA, AAA, left foot amputation s/p fem pop bypass and heart failure who presented to the emergency department 4/21 after undergoing outpatient stress test that revealed EF of 20% and acute ischemia.  Since admission patient has been evaluated by cardiology and cardiothoracic surgery and patient underwent TEE and left heart cath.  LHC/RHC revealed severe three-vessel coronary artery disease with moderately elevated left heart and pulmonary artery pressures.  At that time patient was placed on goal-directed medical therapy for acute systolic congestive heart failure and CTS surgery was consulted for CABG potential.  Patient underwent CABG x5 with Dr. Orvan Seen 4/26, post CABG CODE BLUE called due to low flow on Impella, CTS notified.  Patient underwent bedside exploratory postoperative open heart where Dr. Julien Girt reopened the chest incision and hematoma was removed, patient's hemodynamics improved upon opening sternotomy.  PCCM consulted morning of 4/28 due to difficulty weaning ventilator  Pertinent  Medical History  Hypertension, hyperlipidemia, type 2 diabetes, CVA, AAA, left foot amputation s/p fem pop bypass and heart failure   Significant Hospital Events: Including procedures, antibiotic start and stop dates in addition to other pertinent events   . 4/21 presented to the ED for evaluation of abnormal stress test . 4/22 left and right heart cath severe multivessel disease . 4/26 CABG x5 with hemodynamic instability post requiring brief CPR with eventual return back to the OR anastomosis bleeding was seen . 4/28 remains ventilator dependent, slowly weaning  PEEP . 5/1 remains on vent, diuresing, no ready to come off  . 5/3 started on CRRT. . 5/5 requiring sedation for vent  . 5/6 CT scan shows multiple areas of posterior circulation infarction. . 5/8 tolerating CRRT with fluid removal.  Decreasing vasopressor requirements.  Interim History / Subjective:   Continues to require sedation to prevent nonpurposeful agitation.  No further bloody bowel movements.  Objective   Blood pressure 138/68, pulse 66, temperature 99.5 F (37.5 C), temperature source Oral, resp. rate 17, height '6\' 1"'$  (1.854 m), weight 120.9 kg, SpO2 100 %. CVP:  [6 mmHg-23 mmHg] 9 mmHg  Vent Mode: PRVC FiO2 (%):  [40 %] 40 % Set Rate:  [20 bmp] 20 bmp Vt Set:  [630 mL] 630 mL PEEP:  [5 cmH20] 5 cmH20 Plateau Pressure:  [16 cmH20-25 cmH20] 23 cmH20   Intake/Output Summary (Last 24 hours) at 08/06/2020 1543 Last data filed at 08/06/2020 1500 Gross per 24 hour  Intake 4962.06 ml  Output 6724 ml  Net -1761.94 ml   Filed Weights   08/02/20 0415 08/03/20 0400 08/06/20 0615  Weight: 127.9 kg 125.3 kg 120.9 kg    Examination: General: Critically ill appearing older adult M intubated sedated NAD HEENT: ETT secure slightly icteric sclera Pink mmm Neuro: Sedated.  Minimal response to painful stimuli. CV: impella p5. Cap refill < 3 sec. irreg rhythm.  PULM: Mechanically ventilated. Symmetrical chest expansion. Diminished basilar sounds. Dyssynchrony. Patient wants to take slow large breaths.  GI: Soft round ndnt  GU: CRRT Extremities: Anasarca. No acute joint deformity. No clubbing or cyanosis. L metatarsal amputation Skin: Midline sternotomy incision well approximated, c/d/healing   Point-of-care echocardiogram shows Impella tip in stable position.  Significant improvement in LV function now estimating EF  40% by visual estimation.  Labs/imaging that I have personally reviewed    CT head personally reviewed and shows multiple areas of brainstem and cerebellar  infarction.  Hemoglobin stable at 8.5.  Resolved Hospital Problem list     Assessment & Plan:   Critically ill due to acute hypoxic respiratory failure  R pleural effusion, mild pulmonary edema  Critically ill due to cardiogenic Shock requiring mechanical cardiac support Persistent encephalopathy-multifactorial including prolonged ICU stay and sedative infusions Evidence of acute posterior circulation CVA CAD sp CABG x5 AKI Colonic ileus Anemia, post op  Thrombocytopenia - stable  Low grade pyrexia of unclear etiology PAD s/p partial L foot amputation  AAA Poorly controlled DM2 with microvascular complications.  Possible GI bleed  Plan:  -Continue current ventilator strategy -Continue cardiac support primarily with Impella.  Has not tolerated decreasing Impella support to P5 with no change in blood pressure.  Remains on minimal norepinephrine -Continue aggressive fluid removal with CRRT targeting net 100-150 mL/h -Unable to fully stop sedation to assess best neurological examination.  - Family meeting tomorrow regarding direction of care. Still requiring mechanical support so prognosis from cardiac standpoint is guarded and further complicated with possibility of significant brain injury.    Best practice   Diet:  Tube Feed  - on hold for ileus, started Reglan and neostigmine.  Pain/Anxiety/Delirium protocol (if indicated): Yes (RASS goal -1) - fentanyl prn, wean Precedex as tolerated VAP protocol (if indicated): Yes DVT prophylaxis: Systemic AC - Heparin in Impella purge. GI prophylaxis: PPI Glucose control:  SSI Yes - SSI + basal + q4  Central venous access:  Yes, and it is still needed, Arterial line:  Yes, and it is still needed -- may need to replace  Foley:  Yes, and it is no longer needed - Can remove now that on CRRT Mobility:  bed rest  PT consulted: N/A Last date of multidisciplinary goals of care discussion: I spoke with the patient's sons today 5/6 and  informed them that he remains critically ill from a cardiovascular standpoint and that recovery is not guaranteed.  They also understand that presence of cerebral infarction would likely indicate a poor overall outcome and that this a transition to comfort care may be appropriate at that time.  Family meeting scheduled for 5/9. Code Status:  full code Disposition: ICU  Labs   CBC: Recent Labs  Lab 08/03/20 0337 08/04/20 0004 08/04/20 0304 08/04/20 0351 08/04/20 0800 08/04/20 0850 08/05/20 0425 08/05/20 1401 08/06/20 0343 08/06/20 0347 08/06/20 0348 08/06/20 0622 08/06/20 0624  WBC 14.5*  --  17.4*  --  18.1*  --  23.7*  --   --  21.2*  --   --   --   NEUTROABS  --   --   --   --  13.2*  --   --   --   --   --   --   --   --   HGB 9.3*   < > 8.7*   < > 9.2*   < > 8.6*   < > 9.2* 7.9* 8.8* 9.9* 8.5*  HCT 30.6*   < > 28.6*   < > 29.8*   < > 28.3*   < > 27.0* 25.8* 26.0* 29.0* 25.0*  MCV 94.4  --  93.2  --  92.8  --  93.4  --   --  94.2  --   --   --   PLT 143*  --  140*  --  158  --  154  --   --  175  --   --   --    < > = values in this interval not displayed.    Basic Metabolic Panel: Recent Labs  Lab 08/02/20 0316 08/02/20 1600 08/03/20 0337 08/03/20 1619 08/04/20 0304 08/04/20 0351 08/04/20 1630 08/05/20 0400 08/05/20 0425 08/05/20 1652 08/05/20 1903 08/05/20 2341 08/06/20 0122 08/06/20 0124 08/06/20 0343 08/06/20 0347 08/06/20 0348 08/06/20 0622 08/06/20 0624  NA 135   < > 136   < > 135   < > 136   < > 133*  133* 133*   < > 138 138   < > 138 136  135 136 137 136  K 4.7   < > 4.6   < > 4.9   < > 5.0   < > 5.5*  5.5* 5.7*   < > 4.6 4.6   < > 4.6 4.9  4.8 4.9 4.8 4.7  CL 106   < > 101   < > 104  --  104  --  102  102 101   < > 95* 93*  --  93* 97*  96*  --  92*  --   CO2 24   < > 26   < > 24  --  24  --  '24  23 23  '$ --   --   --   --   --  29  28  --   --   --   GLUCOSE 258*   < > 163*   < > 158*  --  55*  --  193*  192* 203*   < > 177* 173*  --  170*  134*  133*  --  157*  --   BUN 62*   < > 46*   < > 39*  --  42*  --  44*  43* 51*   < > 42* 42*  --  41* 46*  45*  --  41*  --   CREATININE 2.75*   < > 2.00*   < > 1.93*  --  2.19*  --  2.21*  2.17* 2.55*   < > 1.70* 1.80*  --  1.80* 2.28*  2.28*  --  1.90*  --   CALCIUM 7.4*   < > 7.9*   < > 7.7*  --  7.7*  --  7.5*  7.6* 7.7*  --   --   --   --   --  8.5*  8.6*  --   --   --   MG 2.2  --  2.5*  --  2.4  --   --   --  2.6*  --   --   --   --   --   --  2.4  --   --   --   PHOS 2.2*   < > 2.7   < > 3.5  --  3.4  --  3.6 5.5*  --   --   --   --   --  4.1  --   --   --    < > = values in this interval not displayed.   GFR: Estimated Creatinine Clearance: 50.7 mL/min (A) (by C-G formula based on SCr of 1.9 mg/dL (H)). Recent Labs  Lab 08/01/20 0819 08/02/20 0316 08/04/20 0304 08/04/20 0800 08/05/20 0425 08/06/20 0347  WBC  --    < > 17.4* 18.1* 23.7* 21.2*  LATICACIDVEN 0.8  --   --   --   --   --    < > = values in this interval not displayed.    Liver Function Tests: Recent Labs  Lab 08/04/20 0304 08/04/20 1630 08/05/20 0425 08/05/20 1652 08/06/20 0347  ALBUMIN 2.2* 2.1* 2.1* 2.1* 2.2*   No results for input(s): LIPASE, AMYLASE in the last 168 hours. No results for input(s): AMMONIA in the last 168 hours.  ABG    Component Value Date/Time   PHART 7.373 08/06/2020 0624   PCO2ART 53.5 (H) 08/06/2020 0624   PO2ART 125 (H) 08/06/2020 0624   HCO3 31.0 (H) 08/06/2020 0624   TCO2 33 (H) 08/06/2020 0624   ACIDBASEDEF 2.0 08/05/2020 0400   O2SAT 99.0 08/06/2020 0624     Coagulation Profile: No results for input(s): INR, PROTIME in the last 168 hours.  Cardiac Enzymes: No results for input(s): CKTOTAL, CKMB, CKMBINDEX, TROPONINI in the last 168 hours.  HbA1C: Hgb A1c MFr Bld  Date/Time Value Ref Range Status  07/06/2020 06:28 PM 11.6 (H) 4.8 - 5.6 % Final    Comment:    (NOTE) Pre diabetes:          5.7%-6.4%  Diabetes:              >6.4%  Glycemic control  for   <7.0% adults with diabetes   08/05/2019 03:09 AM 11.6 (H) 4.8 - 5.6 % Final    Comment:    (NOTE) Pre diabetes:          5.7%-6.4% Diabetes:              >6.4% Glycemic control for   <7.0% adults with diabetes     CBG: Recent Labs  Lab 08/05/20 1650 08/05/20 1900 08/05/20 2341 08/06/20 0354 08/06/20 0803  GLUCAP 189* 177* 149* 129* 137*    CRITICAL CARE Performed by: Kipp Brood   Total critical care time: 40 minutes  Critical care time was exclusive of separately billable procedures and treating other patients. Critical care was necessary to treat or prevent imminent or life-threatening deterioration.  Critical care was time spent personally by me on the following activities: development of treatment plan with patient and/or surrogate as well as nursing, discussions with consultants, evaluation of patient's response to treatment, examination of patient, obtaining history from patient or surrogate, ordering and performing treatments and interventions, ordering and review of laboratory studies, ordering and review of radiographic studies, pulse oximetry and re-evaluation of patient's condition.  Kipp Brood, MD [facility] ICU Physician [place]  Pager: [phone] Or Epic Secure Chat After hours: [phone].  08/06/2020, 3:43 PM      08/06/2020, 3:43 PM

## 2020-08-06 NOTE — Procedures (Signed)
Admit: 06/30/2020 LOS: 81  5M progressive AKI after CABG with prolonged shock (cardiogenic and ? Vasodilatory/septic), failure to achieve target volume status despite aggressive diuretics.    Current CRRT Prescription: Start Date: 08/01/20 Catheter: L Fem Temp IJ CCM placed 5/3 BFR: 300 Pre Blood Pump: 500 4K DFR: 1500 4K Replacement Rate: 300 2K Goal UF: 173m net neg/h Anticoagulation: Citrate Clotting: None since transition to citrate  S: Stable on CRRT, Tolerating UF K 4.9, P 4.1 GOC meeting today with palliative care scheduled Started on citrate yesterday because of frequent clotting, on systemic heparin but having quite a bit of bruising and reservation to add more potential anticoagulation globally  O: 05/07 0701 - 05/08 0700 In: 4430.3 [I.V.:2646.7; NG/GT:1505] Out: 5219 [Chest Tube:30]  Filed Weights   08/02/20 0415 08/03/20 0400 08/06/20 0615  Weight: 127.9 kg 125.3 kg 120.9 kg    Recent Labs  Lab 08/05/20 0425 08/05/20 1652 08/05/20 1903 08/06/20 0343 08/06/20 0347 08/06/20 0348 08/06/20 0622 08/06/20 0624  NA 133*  133* 133*   < > 138 136  135 136 137 136  K 5.5*  5.5* 5.7*   < > 4.6 4.9  4.8 4.9 4.8 4.7  CL 102  102 101   < > 93* 97*  96*  --  92*  --   CO2 _0 --   --  29  28  --   --   --   GLUCOSE 193*  192* 203*   < > 170* 134*  133*  --  157*  --   BUN 44*  43* 51*   < > 41* 46*  45*  --  41*  --   CREATININE 2.21*  2.17* 2.55*   < > 1.80* 2.28*  2.28*  --  1.90*  --   CALCIUM 7.5*  7.6* 7.7*  --   --  8.5*  8.6*  --   --   --   PHOS 3.6 5.5*  --   --  4.1  --   --   --    < > = values in this interval not displayed.   Recent Labs  Lab 08/04/20 0800 08/04/20 0850 08/05/20 0425 08/05/20 1401 08/06/20 0347 08/06/20 0348 08/06/20 0622 08/06/20 0624  WBC 18.1*  --  23.7*  --  21.2*  --   --   --   NEUTROABS 13.2*  --   --   --   --   --   --   --   HGB 9.2*   < > 8.6*   < > 7.9* 8.8* 9.9* 8.5*  HCT 29.8*   < > 28.3*    < > 25.8* 26.0* 29.0* 25.0*  MCV 92.8  --  93.4  --  94.2  --   --   --   PLT 158  --  154  --  175  --   --   --    < > = values in this interval not displayed.    Scheduled Meds: . artificial tears  1 application Both Eyes [id number] . aspirin EC  325 mg Oral Daily   Or  . aspirin  324 mg Per Tube Daily  . B-complex with vitamin C  1 tablet Per Tube Daily  . bisacodyl  10 mg Oral Daily   Or  . bisacodyl  10 mg Rectal Daily  . chlorhexidine gluconate (MEDLINE KIT)  15 mL Mouth Rinse BID  . Chlorhexidine Gluconate  Cloth  6 each Topical Daily  . clonazepam  0.25 mg Per Tube BID  . docusate  200 mg Per Tube Daily  . feeding supplement (PROSource TF)  45 mL Per Tube QID  . HYDROmorphone HCl  2 mg Per Tube Q6H  . insulin aspart  10 Units Subcutaneous Q4H  . insulin aspart  3-9 Units Subcutaneous Q4H  . insulin detemir  29 Units Subcutaneous Q12H  . mouth rinse  15 mL Mouth Rinse 10 times per day  . metoCLOPramide (REGLAN) injection  5 mg Intravenous Q8H  . midodrine  5 mg Per Tube TID WC  . pantoprazole sodium  40 mg Per Tube Daily  . polyethylene glycol  17 g Per Tube Daily  . QUEtiapine  25 mg Per Tube BID  . rosuvastatin  40 mg Per Tube Daily  . sodium chloride flush  10-40 mL Intracatheter Q12H   Continuous Infusions: .  prismasol BGK 4/2.5 500 mL/hr at 08/05/20 0800  . sodium chloride Stopped (08/01/20 1202)  . sodium chloride    . amiodarone 30 mg/hr (08/06/20 0800)  . calcium gluconate infusion for CRRT 20 g (08/05/20 1705)  . citrate dextrose 320 mL/hr at 08/06/20 6803  . dexmedetomidine (PRECEDEX) IV infusion 0.4 mcg/kg/hr (08/06/20 0800)  . dextrose    . feeding supplement (VITAL 1.5 CAL) 1,000 mL (08/06/20 0413)  . fentaNYL infusion INTRAVENOUS 100 mcg/hr (08/06/20 0800)  . heparin 1,450 Units/hr (08/06/20 0800)  . impella catheter heparin 50 unit/mL in dextrose 5% 11.9 mL/hr at 07/31/20 0148  . norepinephrine (LEVOPHED) Adult infusion Stopped (08/06/20 0522)  .  prismasol BGK 2/2.5 replacement solution 300 mL/hr at 08/06/20 1017  . prismasol BGK 4/2.5 1,500 mL/hr at 08/06/20 0939   PRN Meds:.sodium chloride, Place/Maintain arterial line **AND** sodium chloride, acetaminophen, dextrose, fentaNYL, fentaNYL (SUBLIMAZE) injection, heparin, metoprolol tartrate, ondansetron (ZOFRAN) IV, sodium chloride, sodium chloride flush, traMADol  ABG    Component Value Date/Time   PHART 7.373 08/06/2020 0624   PCO2ART 53.5 (H) 08/06/2020 0624   PO2ART 125 (H) 08/06/2020 0624   HCO3 31.0 (H) 08/06/2020 0624   TCO2 33 (H) 08/06/2020 0624   ACIDBASEDEF 2.0 08/05/2020 0400   O2SAT 99.0 08/06/2020 0624    A/P  1. Dialysis dependent AKI, progressive, nonoliguric 2/2 cardiorenal syndrome probably some ATN 2. Acute systolic CHF / ICM; impella, off inotropes; on NE; massive vol overload; slowly improving 3. CAD s/p  5V CABG 4/26 4. Anemia, mild, per primary 5. DM2 6. PAD hx/o Fem Pop Bypass 7. Diuretic Resistance despite lasix gtt and metolazone 8. Fevers,  9. VDRF 10. Hypophoshatemia on CRRT, stable 11. Numerous ischemic bilateral posterior CVAs, neurology following 12. Hyperkalemia, improved after placing post replacement bath at 2K   Continue CRRT at current settings   agree with goals of care conversation, grim prognosis, support transition to comfort measures   Rexene Agent, MD  [facility]

## 2020-08-06 NOTE — Progress Notes (Addendum)
[place] for Heparin Indication: Impella 5.5  No Known Allergies  Patient Measurements: Height: '6\' 1"'$  (185.4 cm) Weight: 120.9 kg (266 lb 8.6 oz) IBW/kg (Calculated) : 79.9 Heparin Dosing Weight: 102.8 kg  Vital Signs: Temp: 99.5 F (37.5 C) (05/08 1200) Temp Source: Oral (05/08 1600) BP: 138/68 (05/08 0700) Pulse Rate: 66 (05/08 0715)  Labs: Recent Labs    08/04/20 0800 08/04/20 0850 08/05/20 0425 08/05/20 0430 08/05/20 1652 08/05/20 1903 08/06/20 0347 08/06/20 0348 08/06/20 1337 08/06/20 1339 08/06/20 1703 08/06/20 1739  HGB 9.2*   < > 8.6*   < >  --    < > 7.9*   < > 8.2* 9.2*  --  9.2*  8.2*  HCT 29.8*   < > 28.3*   < >  --    < > 25.8*   < > 24.0* 27.0*  --  27.0*  24.0*  PLT 158  --  154  --   --   --  175  --   --   --   --   --   HEPARINUNFRC  --    < >  --    < > 0.18*  --  0.22*  --   --   --  0.12*  --   CREATININE  --    < > 2.21*  2.17*  --  2.55*   < > 2.28*  2.28*   < > 2.10* 1.70*  --  1.70*  2.10*   < > = values in this interval not displayed.    Estimated Creatinine Clearance: 45.9 mL/min (A) (by C-G formula based on SCr of 2.1 mg/dL (H)).   Assessment: 70 yo M presents with NSTEMI and multivessel CAD, now s/p CABG with Impella support. Pharmacy asked to manage systemic heparin.  Heparin level is down to 0.12, per nursing heparin purge was off briefly this afternoon and purge flow is now running less than previously. Will increase systemic heparin slightly and recheck in am. Rectal bleeding has been ongoing but is stable, bloody sputum has resolved.  Goal of Therapy:  Heparin level 0.2-0.3 units/ml Monitor platelets by anticoagulation protocol: Yes   Plan:  Continue heparin purge solution (50 units/ml) Increase IV heparin to 1500 units/hr Check q12h heparin level for now   Arrie Senate, PharmD, BCPS, [facility] Clinical Pharmacist [phone] Please check AMION for all [facility]  numbers 08/06/2020

## 2020-08-06 NOTE — Plan of Care (Signed)
  Problem: Clinical Measurements: Goal: Ability to maintain clinical measurements within normal limits will improve Outcome: Progressing Goal: Will remain free from infection Outcome: Progressing Goal: Diagnostic test results will improve Outcome: Not Progressing Goal: Respiratory complications will improve Outcome: Not Progressing Goal: Cardiovascular complication will be avoided Outcome: Not Progressing   Problem: Activity: Goal: Risk for activity intolerance will decrease Outcome: Not Progressing   Problem: Nutrition: Goal: Adequate nutrition will be maintained Outcome: Progressing   Problem: Coping: Goal: Level of anxiety will decrease Outcome: Not Progressing   Problem: Elimination: Goal: Will not experience complications related to bowel motility Outcome: Not Progressing Goal: Will not experience complications related to urinary retention Outcome: Not Applicable   Problem: Pain Managment: Goal: General experience of comfort will improve Outcome: Progressing   Problem: Safety: Goal: Ability to remain free from injury will improve Outcome: Progressing   Problem: Skin Integrity: Goal: Risk for impaired skin integrity will decrease Outcome: Progressing   Problem: Activity: Goal: Risk for activity intolerance will decrease Outcome: Not Progressing   Problem: Cardiac: Goal: Will achieve and/or maintain hemodynamic stability Outcome: Not Progressing   Problem: Clinical Measurements: Goal: Postoperative complications will be avoided or minimized Outcome: Not Progressing   Problem: Respiratory: Goal: Respiratory status will improve Outcome: Not Progressing   Problem: Skin Integrity: Goal: Wound healing without signs and symptoms of infection Outcome: Progressing Goal: Risk for impaired skin integrity will decrease Outcome: Not Progressing   Problem: Urinary Elimination: Goal: Ability to achieve and maintain adequate renal perfusion and functioning will  improve Outcome: Not Applicable   Problem: Activity: Goal: Ability to tolerate increased activity will improve Outcome: Not Progressing   Problem: Respiratory: Goal: Ability to maintain a clear airway and adequate ventilation will improve Outcome: Not Progressing   Problem: Role Relationship: Goal: Method of communication will improve Outcome: Not Progressing

## 2020-08-06 NOTE — Progress Notes (Signed)
[place] for Heparin Indication: Impella 5.5  No Known Allergies  Patient Measurements: Height: '6\' 1"'$  (185.4 cm) Weight: 120.9 kg (266 lb 8.6 oz) IBW/kg (Calculated) : 79.9 Heparin Dosing Weight: 102.8 kg  Vital Signs: Temp: 99 F (37.2 C) (05/08 0430) Temp Source: Oral (05/08 0430) BP: 138/68 (05/08 0700) Pulse Rate: 66 (05/08 0715)  Labs: Recent Labs    08/04/20 0800 08/04/20 0850 08/05/20 0425 08/05/20 0430 08/05/20 1401 08/05/20 1652 08/05/20 1903 08/06/20 0343 08/06/20 0347 08/06/20 0348 08/06/20 0622 08/06/20 0624  HGB 9.2*   < > 8.6*  --    < >  --    < > 9.2* 7.9* 8.8* 9.9* 8.5*  HCT 29.8*   < > 28.3*  --    < >  --    < > 27.0* 25.8* 26.0* 29.0* 25.0*  PLT 158  --  154  --   --   --   --   --  175  --   --   --   HEPARINUNFRC  --    < >  --  0.16*  --  0.18*  --   --  0.22*  --   --   --   CREATININE  --    < > 2.21*  2.17*  --   --  2.55*   < > 1.80* 2.28*  2.28*  --  1.90*  --    < > = values in this interval not displayed.    Estimated Creatinine Clearance: 50.7 mL/min (A) (by C-G formula based on SCr of 1.9 mg/dL (H)).   Assessment: 70 yo M presents with NSTEMI and multivessel CAD, now s/p CABG with Impella support. Pharmacy asked to manage systemic heparin.  Heparin level is therapeutic at 0.22, on heparin infusion at 1450 units/hr. Bloody sputum has resolved, had some melena (but not further since yesterday). Hgb 8.5, plt 175. LDH 324. Purge flow running at 11.7 mL/hr (585 units/hr), pressures 400-500.  Goal of Therapy:  Heparin level 0.2-0.3 units/ml Monitor platelets by anticoagulation protocol: Yes   Plan:  Continue heparin purge solution (50 units/ml) Continue IV heparin at 1450 units/hr Check q12h heparin level for now  Antonietta Jewel, PharmD, [facility] Pharmacist  Phone: [phone] 08/06/2020 7:28 AM  Please check AMION for all [place] phone numbers After 10:00 PM, call [place]  [phone]

## 2020-08-06 NOTE — Progress Notes (Signed)
[address]       [place] [address]             [phone]        CARDIOTHORACIC SURGERY PROGRESS NOTE   R12 Days Post-Op Procedure(s) (LRB): EXPLORATION POST OPERATIVE OPEN HEART (N/A)  Subjective: No events.  Sedated on vent.  No purposeful movements when sedation off  Objective: Vital signs: BP Readings from Last 1 Encounters:  08/06/20 138/68   Pulse Readings from Last 1 Encounters:  08/06/20 66   Resp Readings from Last 1 Encounters:  08/06/20 17   Temp Readings from Last 1 Encounters:  08/06/20 99.9 F (37.7 C) (Oral)    Hemodynamics: CVP:  [6 mmHg-27 mmHg] 9 mmHg  Mixed venous co-ox 59%   Physical Exam:  Rhythm:   Afib w/ controlled rate  Breath sounds: coarse  Heart sounds:  irregualr  Incisions:  Clean and dry  Abdomen:  Soft, non-distended  Extremities:  Warm, adequately perfused   Intake/Output from previous day: 05/07 0701 - 05/08 0700 In: 4430.3 [I.V.:2646.7; NG/GT:1505] Out: 5219 [Chest Tube:30] Intake/Output this shift: Total I/O In: 222.8 [I.V.:124.2; Other:23.6; NG/GT:75] Out: -   Lab Results:  CBC: Recent Labs    08/05/20 0425 08/05/20 1401 08/06/20 0347 08/06/20 0348 08/06/20 0622 08/06/20 0624  WBC 23.7*  --  21.2*  --   --   --   HGB 8.6*   < > 7.9*   < > 9.9* 8.5*  HCT 28.3*   < > 25.8*   < > 29.0* 25.0*  PLT 154  --  175  --   --   --    < > = values in this interval not displayed.    BMET:  Recent Labs    08/05/20 1652 08/05/20 1903 08/06/20 0347 08/06/20 0348 08/06/20 0622 08/06/20 0624  NA 133*   < > 136  135   < > 137 136  K 5.7*   < > 4.9  4.8   < > 4.8 4.7  CL 101   < > 97*  96*  --  92*  --   CO2 23  --  29  28  --   --   --   GLUCOSE 203*   < > 134*  133*  --  157*  --   BUN 51*   < > 46*  45*  --  41*  --   CREATININE 2.55*   < > 2.28*  2.28*  --  1.90*  --   CALCIUM 7.7*  --  8.5*  8.6*  --   --   --    < > = values in this interval not displayed.     PT/INR:   No results for input(s): LABPROT, INR in the last 72 hours.  CBG (last 3)  Recent Labs    08/05/20 2341 08/06/20 0354 08/06/20 0803  GLUCAP 149* 129* 137*    ABG    Component Value Date/Time   PHART 7.373 08/06/2020 0624   PCO2ART 53.5 (H) 08/06/2020 0624   PO2ART 125 (H) 08/06/2020 0624   HCO3 31.0 (H) 08/06/2020 0624   TCO2 33 (H) 08/06/2020 0624   ACIDBASEDEF 2.0 08/05/2020 0400   O2SAT 99.0 08/06/2020 0624    CXR: n/a  Assessment/Plan: S/P Procedure(s) (LRB): EXPLORATION POST OPERATIVE OPEN HEART (N/A)  No significant changes although now w/ low grade fever Neurologic status poor w/ multiple strokes in posterior circulation noted on CT  head - long term prognosis related to neuro status somewhat unclear but likely poor Hemodynamics remain tenuous although improved mixed venous co-ox remains on full support Impella Oxygenation stable Leukocytosis now w/ low grade fever ARF on CVVHD   I agree that long-term prognosis is grim  Plans for Dr Lynetta Mare and Palliative Care Team to meet w/ family later today or tomorrow for [place] conference   Rexene Alberts, MD 08/06/2020 10:12 AM

## 2020-08-06 NOTE — Progress Notes (Addendum)
    Progress Note from the Palliative Medicine Team at [facility]   Patient Name: Danny Carpenter       Date: 08/06/2020 DOB: 09/16/1950  Age: 70 y.o. MRN#: [id number] Attending Physician: Wonda Olds, MD Primary Care Physician: Serita Grammes, MD Admit Date: 07/02/2020   Medical records reviewed   This NP spoke with bedside RN and then spoke to daughter in law/ [name] main spokes person for patient at this time,  to clarify meeting time for family meeting tomorrow.    Plan is for  12:88meting tomorrow.   KJoelene Millinand the patient's spouse will be present for sure and likely several other family members.   Questions and concerns addressed    No charge  MWadie LessenNP  Palliative Medicine Team Team Phone # [phone] [phone]

## 2020-08-07 ENCOUNTER — Inpatient Hospital Stay (HOSPITAL_COMMUNITY): Payer: No Typology Code available for payment source

## 2020-08-07 DIAGNOSIS — Z9889 Other specified postprocedural states: Secondary | ICD-10-CM | POA: Diagnosis not present

## 2020-08-07 DIAGNOSIS — N186 End stage renal disease: Secondary | ICD-10-CM

## 2020-08-07 DIAGNOSIS — Z515 Encounter for palliative care: Secondary | ICD-10-CM

## 2020-08-07 DIAGNOSIS — Z95811 Presence of heart assist device: Secondary | ICD-10-CM

## 2020-08-07 DIAGNOSIS — Z7189 Other specified counseling: Secondary | ICD-10-CM

## 2020-08-07 DIAGNOSIS — Z951 Presence of aortocoronary bypass graft: Secondary | ICD-10-CM | POA: Diagnosis not present

## 2020-08-07 LAB — POCT I-STAT, CHEM 8
BUN: 35 mg/dL — ABNORMAL HIGH (ref 8–23)
BUN: 34 mg/dL — ABNORMAL HIGH (ref 8–23)
BUN: 38 mg/dL — ABNORMAL HIGH (ref 8–23)
BUN: 33 mg/dL — ABNORMAL HIGH (ref 8–23)
BUN: 36 mg/dL — ABNORMAL HIGH (ref 8–23)
BUN: 33 mg/dL — ABNORMAL HIGH (ref 8–23)
BUN: 35 mg/dL — ABNORMAL HIGH (ref 8–23)
BUN: 33 mg/dL — ABNORMAL HIGH (ref 8–23)
BUN: 36 mg/dL — ABNORMAL HIGH (ref 8–23)
BUN: 31 mg/dL — ABNORMAL HIGH (ref 8–23)
BUN: 35 mg/dL — ABNORMAL HIGH (ref 8–23)
Calcium, Ion: 0.44 mmol/L — CL (ref 1.15–1.40)
Calcium, Ion: 0.46 mmol/L — CL (ref 1.15–1.40)
Calcium, Ion: 1.08 mmol/L — ABNORMAL LOW (ref 1.15–1.40)
Calcium, Ion: 0.45 mmol/L — CL (ref 1.15–1.40)
Calcium, Ion: 1.11 mmol/L — ABNORMAL LOW (ref 1.15–1.40)
Calcium, Ion: 0.44 mmol/L — CL (ref 1.15–1.40)
Calcium, Ion: 1.04 mmol/L — ABNORMAL LOW (ref 1.15–1.40)
Calcium, Ion: 0.49 mmol/L — CL (ref 1.15–1.40)
Calcium, Ion: 1.09 mmol/L — ABNORMAL LOW (ref 1.15–1.40)
Calcium, Ion: 0.36 mmol/L — CL (ref 1.15–1.40)
Calcium, Ion: 1.04 mmol/L — ABNORMAL LOW (ref 1.15–1.40)
Chloride: 86 mmol/L — ABNORMAL LOW (ref 98–111)
Chloride: 86 mmol/L — ABNORMAL LOW (ref 98–111)
Chloride: 87 mmol/L — ABNORMAL LOW (ref 98–111)
Chloride: 85 mmol/L — ABNORMAL LOW (ref 98–111)
Chloride: 86 mmol/L — ABNORMAL LOW (ref 98–111)
Chloride: 85 mmol/L — ABNORMAL LOW (ref 98–111)
Chloride: 89 mmol/L — ABNORMAL LOW (ref 98–111)
Chloride: 85 mmol/L — ABNORMAL LOW (ref 98–111)
Chloride: 87 mmol/L — ABNORMAL LOW (ref 98–111)
Chloride: 85 mmol/L — ABNORMAL LOW (ref 98–111)
Chloride: 88 mmol/L — ABNORMAL LOW (ref 98–111)
Creatinine, Ser: 1.7 mg/dL — ABNORMAL HIGH (ref 0.61–1.24)
Creatinine, Ser: 1.7 mg/dL — ABNORMAL HIGH (ref 0.61–1.24)
Creatinine, Ser: 1.9 mg/dL — ABNORMAL HIGH (ref 0.61–1.24)
Creatinine, Ser: 1.7 mg/dL — ABNORMAL HIGH (ref 0.61–1.24)
Creatinine, Ser: 2 mg/dL — ABNORMAL HIGH (ref 0.61–1.24)
Creatinine, Ser: 1.8 mg/dL — ABNORMAL HIGH (ref 0.61–1.24)
Creatinine, Ser: 2.1 mg/dL — ABNORMAL HIGH (ref 0.61–1.24)
Creatinine, Ser: 1.7 mg/dL — ABNORMAL HIGH (ref 0.61–1.24)
Creatinine, Ser: 2 mg/dL — ABNORMAL HIGH (ref 0.61–1.24)
Creatinine, Ser: 1.6 mg/dL — ABNORMAL HIGH (ref 0.61–1.24)
Creatinine, Ser: 2 mg/dL — ABNORMAL HIGH (ref 0.61–1.24)
Glucose, Bld: 199 mg/dL — ABNORMAL HIGH (ref 70–99)
Glucose, Bld: 189 mg/dL — ABNORMAL HIGH (ref 70–99)
Glucose, Bld: 216 mg/dL — ABNORMAL HIGH (ref 70–99)
Glucose, Bld: 196 mg/dL — ABNORMAL HIGH (ref 70–99)
Glucose, Bld: 211 mg/dL — ABNORMAL HIGH (ref 70–99)
Glucose, Bld: 189 mg/dL — ABNORMAL HIGH (ref 70–99)
Glucose, Bld: 217 mg/dL — ABNORMAL HIGH (ref 70–99)
Glucose, Bld: 181 mg/dL — ABNORMAL HIGH (ref 70–99)
Glucose, Bld: 210 mg/dL — ABNORMAL HIGH (ref 70–99)
Glucose, Bld: 184 mg/dL — ABNORMAL HIGH (ref 70–99)
Glucose, Bld: 210 mg/dL — ABNORMAL HIGH (ref 70–99)
HCT: 30 % — ABNORMAL LOW (ref 39.0–52.0)
HCT: 27 % — ABNORMAL LOW (ref 39.0–52.0)
HCT: 29 % — ABNORMAL LOW (ref 39.0–52.0)
HCT: 25 % — ABNORMAL LOW (ref 39.0–52.0)
HCT: 27 % — ABNORMAL LOW (ref 39.0–52.0)
HCT: 24 % — ABNORMAL LOW (ref 39.0–52.0)
HCT: 27 % — ABNORMAL LOW (ref 39.0–52.0)
HCT: 27 % — ABNORMAL LOW (ref 39.0–52.0)
HCT: 29 % — ABNORMAL LOW (ref 39.0–52.0)
HCT: 25 % — ABNORMAL LOW (ref 39.0–52.0)
HCT: 28 % — ABNORMAL LOW (ref 39.0–52.0)
Hemoglobin: 10.2 g/dL — ABNORMAL LOW (ref 13.0–17.0)
Hemoglobin: 9.2 g/dL — ABNORMAL LOW (ref 13.0–17.0)
Hemoglobin: 9.9 g/dL — ABNORMAL LOW (ref 13.0–17.0)
Hemoglobin: 8.5 g/dL — ABNORMAL LOW (ref 13.0–17.0)
Hemoglobin: 9.2 g/dL — ABNORMAL LOW (ref 13.0–17.0)
Hemoglobin: 8.2 g/dL — ABNORMAL LOW (ref 13.0–17.0)
Hemoglobin: 9.2 g/dL — ABNORMAL LOW (ref 13.0–17.0)
Hemoglobin: 9.2 g/dL — ABNORMAL LOW (ref 13.0–17.0)
Hemoglobin: 9.9 g/dL — ABNORMAL LOW (ref 13.0–17.0)
Hemoglobin: 8.5 g/dL — ABNORMAL LOW (ref 13.0–17.0)
Hemoglobin: 9.5 g/dL — ABNORMAL LOW (ref 13.0–17.0)
Potassium: 4.1 mmol/L (ref 3.5–5.1)
Potassium: 4.2 mmol/L (ref 3.5–5.1)
Potassium: 4.4 mmol/L (ref 3.5–5.1)
Potassium: 4.1 mmol/L (ref 3.5–5.1)
Potassium: 4.1 mmol/L (ref 3.5–5.1)
Potassium: 3.9 mmol/L (ref 3.5–5.1)
Potassium: 3.9 mmol/L (ref 3.5–5.1)
Potassium: 4 mmol/L (ref 3.5–5.1)
Potassium: 4.2 mmol/L (ref 3.5–5.1)
Potassium: 4.1 mmol/L (ref 3.5–5.1)
Potassium: 4.3 mmol/L (ref 3.5–5.1)
Sodium: 139 mmol/L (ref 135–145)
Sodium: 136 mmol/L (ref 135–145)
Sodium: 138 mmol/L (ref 135–145)
Sodium: 137 mmol/L (ref 135–145)
Sodium: 137 mmol/L (ref 135–145)
Sodium: 136 mmol/L (ref 135–145)
Sodium: 138 mmol/L (ref 135–145)
Sodium: 137 mmol/L (ref 135–145)
Sodium: 138 mmol/L (ref 135–145)
Sodium: 136 mmol/L (ref 135–145)
Sodium: 138 mmol/L (ref 135–145)
TCO2: 34 mmol/L — ABNORMAL HIGH (ref 22–32)
TCO2: 34 mmol/L — ABNORMAL HIGH (ref 22–32)
TCO2: 35 mmol/L — ABNORMAL HIGH (ref 22–32)
TCO2: 33 mmol/L — ABNORMAL HIGH (ref 22–32)
TCO2: 35 mmol/L — ABNORMAL HIGH (ref 22–32)
TCO2: 36 mmol/L — ABNORMAL HIGH (ref 22–32)
TCO2: 38 mmol/L — ABNORMAL HIGH (ref 22–32)
TCO2: 35 mmol/L — ABNORMAL HIGH (ref 22–32)
TCO2: 39 mmol/L — ABNORMAL HIGH (ref 22–32)
TCO2: 36 mmol/L — ABNORMAL HIGH (ref 22–32)
TCO2: 36 mmol/L — ABNORMAL HIGH (ref 22–32)

## 2020-08-07 LAB — CBC
HCT: 25.1 % — ABNORMAL LOW (ref 39.0–52.0)
Hemoglobin: 7.8 g/dL — ABNORMAL LOW (ref 13.0–17.0)
MCH: 29.1 pg (ref 26.0–34.0)
MCHC: 31.1 g/dL (ref 30.0–36.0)
MCV: 93.7 fL (ref 80.0–100.0)
Platelets: 212 10*3/uL (ref 150–400)
RBC: 2.68 MIL/uL — ABNORMAL LOW (ref 4.22–5.81)
RDW: 18.3 % — ABNORMAL HIGH (ref 11.5–15.5)
WBC: 21.2 10*3/uL — ABNORMAL HIGH (ref 4.0–10.5)
nRBC: 0.4 % — ABNORMAL HIGH (ref 0.0–0.2)

## 2020-08-07 LAB — RENAL FUNCTION PANEL
Albumin: 2.1 g/dL — ABNORMAL LOW (ref 3.5–5.0)
Albumin: 2.1 g/dL — ABNORMAL LOW (ref 3.5–5.0)
Anion gap: 12 (ref 5–15)
Anion gap: 11 (ref 5–15)
BUN: 40 mg/dL — ABNORMAL HIGH (ref 8–23)
BUN: 35 mg/dL — ABNORMAL HIGH (ref 8–23)
CO2: 35 mmol/L — ABNORMAL HIGH (ref 22–32)
CO2: 38 mmol/L — ABNORMAL HIGH (ref 22–32)
Calcium: 9.1 mg/dL (ref 8.9–10.3)
Calcium: 9.3 mg/dL (ref 8.9–10.3)
Chloride: 90 mmol/L — ABNORMAL LOW (ref 98–111)
Chloride: 89 mmol/L — ABNORMAL LOW (ref 98–111)
Creatinine, Ser: 2.19 mg/dL — ABNORMAL HIGH (ref 0.61–1.24)
Creatinine, Ser: 2.13 mg/dL — ABNORMAL HIGH (ref 0.61–1.24)
GFR, Estimated: 32 mL/min — ABNORMAL LOW (ref >60)
GFR, Estimated: 33 mL/min — ABNORMAL LOW (ref >60)
Glucose, Bld: 157 mg/dL — ABNORMAL HIGH (ref 70–99)
Glucose, Bld: 181 mg/dL — ABNORMAL HIGH (ref 70–99)
Phosphorus: 3.5 mg/dL (ref 2.5–4.6)
Phosphorus: 3.6 mg/dL (ref 2.5–4.6)
Potassium: 4.3 mmol/L (ref 3.5–5.1)
Potassium: 4.3 mmol/L (ref 3.5–5.1)
Sodium: 137 mmol/L (ref 135–145)
Sodium: 138 mmol/L (ref 135–145)

## 2020-08-07 LAB — POCT I-STAT 7, (LYTES, BLD GAS, ICA,H+H)
Acid-Base Excess: 13 mmol/L — ABNORMAL HIGH (ref 0.0–2.0)
Bicarbonate: 38.6 mmol/L — ABNORMAL HIGH (ref 20.0–28.0)
Calcium, Ion: 1.05 mmol/L — ABNORMAL LOW (ref 1.15–1.40)
HCT: 26 % — ABNORMAL LOW (ref 39.0–52.0)
Hemoglobin: 8.8 g/dL — ABNORMAL LOW (ref 13.0–17.0)
O2 Saturation: 98 %
Patient temperature: 99.4
Potassium: 4.4 mmol/L (ref 3.5–5.1)
Sodium: 136 mmol/L (ref 135–145)
TCO2: 40 mmol/L — ABNORMAL HIGH (ref 22–32)
pCO2 arterial: 58.4 mmHg — ABNORMAL HIGH (ref 32.0–48.0)
pH, Arterial: 7.43 (ref 7.350–7.450)
pO2, Arterial: 101 mmHg (ref 83.0–108.0)

## 2020-08-07 LAB — LACTATE DEHYDROGENASE: LDH: 385 U/L — ABNORMAL HIGH (ref 98–192)

## 2020-08-07 LAB — CALCIUM, IONIZED: Calcium, Ionized, Serum: 4.3 mg/dL — ABNORMAL LOW (ref 4.5–5.6)

## 2020-08-07 LAB — COOXEMETRY PANEL
Carboxyhemoglobin: 1.3 % (ref 0.5–1.5)
Carboxyhemoglobin: 1.4 % (ref 0.5–1.5)
Methemoglobin: 1.1 % (ref 0.0–1.5)
Methemoglobin: 1 % (ref 0.0–1.5)
O2 Saturation: 51.2 %
O2 Saturation: 46.7 %
Total hemoglobin: 8.7 g/dL — ABNORMAL LOW (ref 12.0–16.0)
Total hemoglobin: 7.9 g/dL — ABNORMAL LOW (ref 12.0–16.0)

## 2020-08-07 LAB — GLUCOSE, CAPILLARY
Glucose-Capillary: 148 mg/dL — ABNORMAL HIGH (ref 70–99)
Glucose-Capillary: 169 mg/dL — ABNORMAL HIGH (ref 70–99)
Glucose-Capillary: 240 mg/dL — ABNORMAL HIGH (ref 70–99)
Glucose-Capillary: 202 mg/dL — ABNORMAL HIGH (ref 70–99)
Glucose-Capillary: 158 mg/dL — ABNORMAL HIGH (ref 70–99)

## 2020-08-07 LAB — MAGNESIUM: Magnesium: 2.2 mg/dL (ref 1.7–2.4)

## 2020-08-07 LAB — HEPARIN LEVEL (UNFRACTIONATED)
Heparin Unfractionated: 0.25 IU/mL — ABNORMAL LOW (ref 0.30–0.70)
Heparin Unfractionated: 0.22 IU/mL — ABNORMAL LOW (ref 0.30–0.70)

## 2020-08-07 MED ORDER — HYDRALAZINE HCL 25 MG PO TABS
25.0000 mg | ORAL_TABLET | Freq: Three times a day (TID) | ORAL | Status: DC
  Administered 2020-08-07: 25 mg via ORAL
  Filled 2020-08-07: qty 1

## 2020-08-07 MED ORDER — HYDRALAZINE HCL 25 MG PO TABS: 25.0000 mg | ORAL_TABLET | Freq: Three times a day (TID) | ORAL | Status: DC

## 2020-08-07 NOTE — Progress Notes (Signed)
[place] for Heparin Indication: Impella 5.5  No Known Allergies  Patient Measurements: Height: '6\' 1"'$  (185.4 cm) Weight: 117.7 kg (259 lb 7.7 oz) IBW/kg (Calculated) : 79.9 Heparin Dosing Weight: 102.8 kg  Vital Signs: Temp: 98.9 F (37.2 C) (05/09 1116) Temp Source: Oral (05/09 1116) BP: 123/42 (05/09 1700) Pulse Rate: 71 (05/09 1700)  Labs: Recent Labs    08/05/20 0425 08/05/20 0430 08/06/20 0347 08/06/20 0348 08/06/20 1703 08/06/20 1739 08/07/20 0408 08/07/20 0413 08/07/20 1421 08/07/20 1620 08/07/20 1623 08/07/20 1627  HGB 8.6*   < > 7.9*   < >  --    < > 7.8*   < > 9.2* 9.5*  --  8.5*  HCT 28.3*   < > 25.8*   < >  --    < > 25.1*   < > 27.0* 28.0*  --  25.0*  PLT 154  --  175  --   --   --  212  --   --   --   --   --   HEPARINUNFRC  --    < > 0.22*  --  0.12*  --  0.25*  --   --   --  0.22*  --   CREATININE 2.21*  2.17*   < > 2.28*  2.28*   < > 2.30*   < >  --    < > 2.00* 1.60* 2.13* 2.00*   < > = values in this interval not displayed.    Estimated Creatinine Clearance: 47.5 mL/min (A) (by C-G formula based on SCr of 2 mg/dL (H)).   Assessment: 70 yo M presents with NSTEMI and multivessel CAD, now s/p CABG with Impella support. Pharmacy asked to manage systemic heparin.  Heparin level is at goal at 0.22 on 1500 units/hr + 590 units/hr from purge solution. Rectal bleeding has been ongoing but is stable, bloody sputum has resolved.  No new bleeding issues or concerns noted per nursing.   Goal of Therapy:  Heparin level 0.2-0.3 units/ml Monitor platelets by anticoagulation protocol: Yes   Plan:  Continue heparin purge solution (50 units/ml) Continue IV heparin to 1500 units/hr Check q12h heparin level for now  Erin Hearing PharmD., BCPS Clinical Pharmacist 08/07/2020 5:50 PM

## 2020-08-07 NOTE — Progress Notes (Signed)
13 Days Post-Op Procedure(s) (LRB): EXPLORATION POST OPERATIVE OPEN HEART (N/A) Subjective: sedated  Objective: Vital signs in last 24 hours: Temp:  [98.9 F (37.2 C)-100.2 F (37.9 C)] 98.9 F (37.2 C) (05/09 1116) Pulse Rate:  [58-88] 78 (05/09 1600) Cardiac Rhythm: Atrial fibrillation (05/09 1200) Resp:  [13-26] 23 (05/09 1600) BP: (66-187)/(45-84) 134/78 (05/09 1600) SpO2:  [88 %-100 %] 99 % (05/09 1600) Arterial Line BP: (99-198)/(42-76) 124/52 (05/09 1600) FiO2 (%):  [40 %] 40 % (05/09 1521) Weight:  [117.7 kg] 117.7 kg (05/09 0615)  Hemodynamic parameters for last 24 hours: CVP:  [4 mmHg-31 mmHg] 10 mmHg  Intake/Output from previous day: 05/08 0701 - 05/09 0700 In: 4614.7 [I.V.:2798; NG/GT:1575] Out: 7031 [Chest Tube:40] Intake/Output this shift: Total I/O In: 1607.4 [I.V.:1008.3; Other:104.1; NG/GT:495] Out: 2810 [Other:2510; Stool:300]  General appearance: sedated Neurologic: not following commands Heart: regular rate and rhythm, S1, S2 normal, no murmur, click, rub or gallop Lungs: diminished breath sounds bibasilar Abdomen: mildly distended Extremities: edema 1+  Lab Results: Recent Labs    08/06/20 0347 08/06/20 0348 08/07/20 0408 08/07/20 0632 08/07/20 1620 08/07/20 1627  WBC 21.2*  --  21.2*  --   --   --   HGB 7.9*   < > 7.8*   < > 9.5* 8.5*  HCT 25.8*   < > 25.1*   < > 28.0* 25.0*  PLT 175  --  212  --   --   --    < > = values in this interval not displayed.   BMET:  Recent Labs    08/06/20 1703 08/06/20 1739 08/07/20 0413 08/07/20 [id number] 08/07/20 1620 08/07/20 1627  NA 136   < > 137   < > 138 136  K 4.7   < > 4.3   < > 4.1 4.3  CL 93*   < > 90*   < > 85* 88*  CO2 31  --  35*  --   --   --   GLUCOSE 226*   < > 157*   < > 210* 184*  BUN 43*   < > 40*   < > 31* 35*  CREATININE 2.30*   < > 2.19*   < > 1.60* 2.00*  CALCIUM 8.9  --  9.1  --   --   --    < > = values in this interval not displayed.    PT/INR: No results for input(s):  LABPROT, INR in the last 72 hours. ABG    Component Value Date/Time   PHART 7.430 08/07/2020 0633   HCO3 38.6 (H) 08/07/2020 0633   TCO2 36 (H) 08/07/2020 1627   ACIDBASEDEF 2.0 08/05/2020 0400   O2SAT 46.7 08/07/2020 0821   CBG (last 3)  Recent Labs    08/07/20 0821 08/07/20 1115 08/07/20 1618  GLUCAP 169* 240* 202*    Assessment/Plan: S/P Procedure(s) (LRB): EXPLORATION POST OPERATIVE OPEN HEART (N/A) agree with plan for trach/enteral feeding access  Would remove impella after trach is performed   LOS: 18 days    Danny Carpenter 08/07/2020

## 2020-08-07 NOTE — Consult Note (Signed)
Consultation Note Date: 08/07/2020   Patient Name: Danny Carpenter  DOB: 1950-05-23  MRN: [id number]  Age / Sex: 70 y.o., male  PCP: Serita Grammes, MD Referring Physician: Wonda Olds, MD  Reason for Consultation: Establishing goals of care and Psychosocial/spiritual support  HPI/Patient Profile: 70 y.o. male  admitted on 07/03/2020 with  past medical history significant for hypertension, hyperlipidemia, type 2 diabetes, CVA, AAA, left foot amputation s/p fem pop bypass and heart failure who presented to the emergency department 4/21 after undergoing outpatient stress test that revealed EF of 20% and acute ischemia.  Since admission patient has been evaluated by cardiology and cardiothoracic surgery and patient underwent TEE and left heart cath.  LHC/RHC revealed severe three-vessel coronary artery disease with moderately elevated left heart and pulmonary artery pressures.  At that time patient was placed on goal-directed medical therapy for acute systolic congestive heart failure and CTS surgery was consulted for CABG potential.  Patient underwent CABG x5 with Dr. Orvan Seen 4/26, post CABG CODE BLUE called due to low flow on Impella, CTS notified.  Patient underwent bedside exploratory postoperative open heart where Dr. Julien Girt reopened the chest incision and hematoma was removed, patient's hemodynamics improved upon opening sternotomy.  PCCM consulted morning of 4/28 due to difficulty weaning ventilator  CRRT initiated on 08/01/2020, currently remains on CRRT tolerating fluid removal.  Head CT on 08/04/2020 significant for multiple areas of posterior circulation infarct.  Per CCM note patient has made some improvement from cardiac standpoint, but neurologic recovery remains uncertain.  Long-term HD is a certainty.  Family face treatment option decisions, advanced directive decisions and anticipatory care  needs.     Clinical Assessment and Goals of Care:   This NP Wadie Lessen reviewed medical records, received report from team, assessed the patient and then meet with with the patient's large family to discuss diagnosis, prognosis, GOCs and options.  (Patient has a large family, to include his wife/Levine daughter [name], and 2 sons [name] and [name].  He also has several stepchildren and present were multiple grandchildren and in-laws)   I had coordinated CCM, neurology and cardiothoracic to be present for family meeting in order to update them from each specialty area.  Unfortunately family was told that they had to wait down stairs at the visitor entrance, and they waited 30 minutes before contacting me.  I was able to meet with a large family and update them on the medical situation.  Concept of Palliative Care was introduced as specialized medical care for people and their families living with serious illness.  If focuses on providing relief from the symptoms and stress of a serious illness.  The goal is to improve quality of life for both the patient and the family.   Values and goals of care important to patient and family were attempted to be elicited.  A detailed discussion was had today regarding patient's current medical situation.  All family members understand the seriousness of the current situation.    Family member speak to understanding the patient's multiple comorbidities even before this hospitalization.  They speak to his significant cardiac disease, peripheral arterial disease status post partial left foot amputation, diabetes, and chronic kidney disease.  Created space and opportunity for patient  and family to explore thoughts and feelings regarding current medical situation.  All family members speak to love and apprecaition of [name].  He was a long-distance Administrator, loved people and loved to talk.  Most recently he was  the caregiver to his  wife who has significant health issues.  At baseline patient was totally independent and in fact was the caregiver for his ill wife   Plan of Care; -no compression, no cardioversion in the event of cardia arrest -as discussed with primary team, family is in agreement to move forward with trach and PEG.  Hopefully this will enable the patient to be weaned off sedation for a better evaluation of underlying neurologic assessment. - Family will make decisions dependant on outcomes.   All family agree quality of life is priority, patient would not want to live dependant.  Family will make ongoing decisions dependant on patient outcomes.   Education offered on the importance of ongoing  conversation with all family members and medical providers ensuring decsions are within the context of the patients values and GOCs.   As discussed with primary team's, family is in agreement to move forward with trach and PEG.  Hopefully this will enable the patient to be weaned off sedation for a better evaluation of underlying neurologic assessment.    Questions and concerns addressed.  Patient  encouraged to call with questions or concerns.     PMT will continue to support holistically.     F/U meeting is scheduled for Monday 08-14-20 at 1200 with family      HPOA/ Glenard Haring Prehn/daughter-- document copied for scanning    SUMMARY OF RECOMMENDATIONS    Code Status/Advance Care Planning:  Limited code   Additional Recommendations (Limitations, Scope, Preferences):  Full Scope Treatment  Psycho-social/Spiritual:   Desire for further Chaplaincy support:yes  Additional Recommendations: Grief/Bereavement Support  Prognosis:   Unable to determine  Discharge Planning: To Be Determined      Primary Diagnoses: Present on Admission: **None**   I have reviewed the medical record, interviewed the patient and family, and examined the patient. The following aspects are pertinent.  Past Medical  History:  Diagnosis Date  . AAA (abdominal aortic aneurysm) without rupture (Experiment) 06/11/2019   infrarenal   Last Assessment & Plan:  Uptodate.  Stable at 4.1cm Repeat visit to vascular surg in 12/2015.  Marland Kitchen Anxiety   . Arthritis   . Chronic fatigue 01/09/2015   Last Assessment & Plan:  Chronic fatigue. Suspect multifactorial including OSA, hyperglycemia (patient not taking insulin as prescribed), pain medications. No other constitutional sx.  Will plan for repeat lab work.   - follow up in 1 mth after sleep study results.  . Chronic pain 04/05/2013   Last Assessment & Plan:   Known Degenerative disc disease and bilateral peripheral neuropathic leg pain   continue cymbalta 48m  reports has trialed injections in past as well as  545mhydrocodone with no success. Also tried tramadol in the past with no benefit.  Prescribed Norco 7.5/325mg Hydrocodone/ Acetaminophen 1-1.5 tabs every 8 hours prn #150. 1 months through 04/17/14  UTox 06/14/14 app  . COPD (chronic obstructive pulmonary disease) ([facility]  . CVA (cerebral vascular accident) ([facility]  . DDD (degenerative disc disease) 10/08/2004  . Depression   . Diabetic foot infection ([facility]  . Fracture of rib    Left  . GERD (gastroesophageal reflux disease)   . HTN (hypertension)   . Hypercholesteremia   . Hyperlipidemia 06/11/2019   On Lipitor 8049mer day  Last Assessment & Plan:  PLAN: HLD continue Lipitor 32m53mily  . Osteomyelitis (HCC)[place]/08/2019   left foot  . PAD (peripheral artery disease) (HCC)[place]/28/2020  . Peripheral vascular disease (  HCC)   . PONV (postoperative nausea and vomiting)   . PVD (peripheral vascular disease) ([place])   . Seizure [facility])    one time in Oct. 2020  . Senile nuclear sclerosis 12/06/2011  . Sleep apnea    no cpap  . Subacute osteomyelitis of left foot ([place])   . Type 2 diabetes mellitus with other specified complication [facility])    Social History   Socioeconomic History  . Marital status: Married     Spouse name: Not on file  . Number of children: Not on file  . Years of education: Not on file  . Highest education level: Not on file  Occupational History  . Occupation: Disabled Administrator  Tobacco Use  . Smoking status: Former Smoker    Packs/day: 1.00    Years: 40.00    Pack years: 40.00    Types: Cigarettes    Quit date: 04/01/2009    Years since quitting: 11.3  . Smokeless tobacco: Never Used  Vaping Use  . Vaping Use: Never used  Substance and Sexual Activity  . Alcohol use: Not Currently  . Drug use: Never  . Sexual activity: Not on file  Other Topics Concern  . Not on file  Social History Narrative   Lives with wife in [place]   Social Determinants of Health   Financial Resource Strain: Not on file  Food Insecurity: Not on file  Transportation Needs: Not on file  Physical Activity: Not on file  Stress: Not on file  Social Connections: Not on file   Family History  Problem Relation Age of Onset  . Cancer Mother   . Cancer Father    Scheduled Meds: . artificial tears  1 application Both Eyes W9N  . aspirin EC  325 mg Oral Daily   Or  . aspirin  324 mg Per Tube Daily  . B-complex with vitamin C  1 tablet Per Tube Daily  . bisacodyl  10 mg Oral Daily   Or  . bisacodyl  10 mg Rectal Daily  . chlorhexidine gluconate (MEDLINE KIT)  15 mL Mouth Rinse BID  . Chlorhexidine Gluconate Cloth  6 each Topical Daily  . clonazepam  0.25 mg Per Tube BID  . docusate  200 mg Per Tube Daily  . feeding supplement (PROSource TF)  45 mL Per Tube QID  . HYDROmorphone HCl  2 mg Per Tube Q6H  . insulin aspart  10 Units Subcutaneous Q4H  . insulin aspart  3-9 Units Subcutaneous Q4H  . insulin detemir  29 Units Subcutaneous Q12H  . levalbuterol  0.63 mg Nebulization Q6H  . mouth rinse  15 mL Mouth Rinse 10 times per day  . metoCLOPramide (REGLAN) injection  5 mg Intravenous Q8H  . pantoprazole sodium  40 mg Per Tube Daily  . polyethylene glycol  17 g Per Tube Daily   . QUEtiapine  25 mg Per Tube BID  . rosuvastatin  40 mg Per Tube Daily  . sodium chloride flush  10-40 mL Intracatheter Q12H   Continuous Infusions: .  prismasol BGK 4/2.5 500 mL/hr at 08/05/20 0800  . sodium chloride Stopped (08/01/20 1202)  . sodium chloride    . amiodarone 30 mg/hr (08/07/20 1000)  . calcium gluconate infusion for CRRT 20 g (08/07/20 0659)  . citrate dextrose 370 mL/hr at 08/07/20 1034  . dexmedetomidine (PRECEDEX) IV infusion 0.2 mcg/kg/hr (08/07/20 1000)  . dextrose    . feeding supplement (VITAL 1.5 CAL) 1,000 mL (08/07/20 0015)  .  fentaNYL infusion INTRAVENOUS 75 mcg/hr (08/07/20 1000)  . heparin 1,500 Units/hr (08/07/20 1046)  . impella catheter heparin 50 unit/mL in dextrose 5% 11.9 mL/hr at 07/31/20 0148  . norepinephrine (LEVOPHED) Adult infusion Stopped (08/06/20 1420)  . prismasol BGK 2/2.5 replacement solution 300 mL/hr at 08/07/20 0358  . prismasol BGK 4/2.5 1,500 mL/hr at 08/07/20 1020   PRN Meds:.sodium chloride, Place/Maintain arterial line **AND** sodium chloride, acetaminophen, dextrose, fentaNYL, fentaNYL (SUBLIMAZE) injection, heparin, metoprolol tartrate, ondansetron (ZOFRAN) IV, sodium chloride, sodium chloride flush, traMADol Medications Prior to Admission:  Prior to Admission medications   Medication Sig Start Date End Date Taking? Authorizing Provider  amLODipine (NORVASC) 10 MG tablet Take 1 tablet (10 mg total) by mouth daily. 01/21/19  Yes Masoudi, Elhamalsadat, MD  aspirin EC 81 MG tablet Take 81 mg by mouth daily. Swallow whole.   Yes [provider]  clopidogrel (PLAVIX) 75 MG tablet Take 75 mg by mouth daily.   Yes [provider]  DULoxetine (CYMBALTA) 30 MG capsule Take 30 mg by mouth daily.   Yes [provider]  gabapentin (NEURONTIN) 600 MG tablet Take 600 mg by mouth 3 (three) times daily.   Yes [provider]  Insulin Degludec (TRESIBA) 100 UNIT/ML SOLN Inject 62 Units into the skin in the  morning. Every morning to control blood sugar   Yes [provider]  levocetirizine (XYZAL) 5 MG tablet Take 5 mg by mouth at bedtime.   Yes [provider]  lisinopril (ZESTRIL) 40 MG tablet Take 40 mg by mouth daily.   Yes [provider]  metFORMIN (GLUCOPHAGE) 1000 MG tablet Take 1,000 mg by mouth 2 (two) times daily with a meal.   Yes [provider]  nitroGLYCERIN (NITROSTAT) 0.4 MG SL tablet Place 0.4 mg under the tongue every 5 (five) minutes as needed for chest pain.   Yes [provider]  pantoprazole (PROTONIX) 40 MG tablet Take 40 mg by mouth daily before breakfast.   Yes [provider]  rosuvastatin (CRESTOR) 20 MG tablet Take 20 mg by mouth daily.   Yes [provider]  Vitamin D, Ergocalciferol, (DRISDOL) 1.25 MG (50000 UNIT) CAPS capsule Take 50,000 Units by mouth 2 (two) times a week.   Yes [provider]   No Known Allergies Review of Systems  Unable to perform ROS: Intubated    Physical Exam Constitutional:      Appearance: He is normal weight. He is ill-appearing.     Interventions: He is intubated.  Cardiovascular:     Rate and Rhythm: Normal rate.  Pulmonary:     Effort: He is intubated.  Skin:    General: Skin is warm and dry.     Vital Signs: BP 112/74   Pulse 77   Temp 99.3 F (37.4 C) (Oral)   Resp (!) 21   Ht 6' 1"  (1.854 m)   Wt 117.7 kg   SpO2 100%   BMI 34.23 kg/m  Pain Scale: CPOT   Pain Score: 0-No pain   SpO2: SpO2: 100 % O2 Device:SpO2: 100 % O2 Flow Rate: .O2 Flow Rate (L/min): 60 L/min  IO: Intake/output summary:   Intake/Output Summary (Last 24 hours) at 08/07/2020 1107 Last data filed at 08/07/2020 1100 Gross per 24 hour  Intake 4300.01 ml  Output 6699 ml  Net -2398.99 ml    LBM: Last BM Date: 08/06/20 Baseline Weight: Weight: 109.8 kg Most recent weight: Weight: 117.7 kg     Palliative Assessment/Data:  Discussed with Dr Norval Morton and Dr Orvan Seen  and Dr Lorrin Goodell  Time In: 1330  Time Out: 6147 Time Total: 75 minutes Greater than 50%  of this time was spent counseling and coordinating care related to the above assessment and plan.  Signed by: Wadie Lessen, NP   Please contact Palliative Medicine Team phone at [phone] for questions and concerns.  For individual provider: See Shea Evans

## 2020-08-07 NOTE — Progress Notes (Signed)
  Echocardiogram 2D Echocardiogram has been performed.  Danny Carpenter 08/07/2020, 10:52 AM

## 2020-08-07 NOTE — Progress Notes (Signed)
Patient ID: Danny Carpenter, male   DOB: 14-Oct-1950, 70 y.o.   MRN: [id number]     Advanced Heart Failure Rounding Note  PCP-Cardiologist: None   Subjective:    - 4/26 S/P CABG - 5/4 Swan removed with ongoing fevers, CVVH started - CT head on 5/6 with multiple posterior circulation infarcts.   Remains intubated/sedated.   Off NE. SBP elevated, currently in 160s.   CVVH ongoing, weight down, pulling 100 cc/hr net negative. CVP 14. Weight still significantly up from baseline.    Gets agitated but will not follow commands. Eyes open.   Tm 100.2. WBCs still high at 21, off cefepime.   Impella P5 Flow 3 No alarms Waveforms ok  LDH 209 => 247 => 215 => 247=>329=>350 => 477 => 370=>366=> 408 => 324 => 385 Position of Impella 08/03/20 adequate Co-ox lower today at 51% on decreased Impella speed with hypertension   Objective:   Weight Range: 117.7 kg Body mass index is 34.23 kg/m.   Vital Signs:   Temp:  [99.2 F (37.3 C)-100.2 F (37.9 C)] 99.4 F (37.4 C) (05/09 0600) Pulse Rate:  [58-88] 70 (05/09 0615) Resp:  [12-26] 20 (05/09 0615) BP: (66-187)/(40-106) 187/84 (05/09 0327) SpO2:  [88 %-100 %] 98 % (05/09 0615) Arterial Line BP: (86-221)/(42-100) 127/49 (05/09 0615) FiO2 (%):  [40 %] 40 % (05/09 0327) Weight:  [117.7 kg] 117.7 kg (05/09 0615) Last BM Date: 08/06/20  Weight change: Filed Weights   08/03/20 0400 08/06/20 0615 08/07/20 0615  Weight: 125.3 kg 120.9 kg 117.7 kg    Intake/Output:   Intake/Output Summary (Last 24 hours) at 08/07/2020 0655 Last data filed at 08/07/2020 0600 Gross per 24 hour  Intake 4626.56 ml  Output 7073 ml  Net -2446.44 ml      Physical Exam   General: On vent Neck: JVP 14 cm, no thyromegaly or thyroid nodule.  Lungs: Decreased at bases.  CV: Nondisplaced PMI.  Heart irregular S1/S2, no S3/S4, no murmur.  1+ edema to knees. Abdomen: Soft, nontender, no hepatosplenomegaly, no distention.  Skin: Intact without lesions or rashes.   Neurologic: Eyes open, does not follow commands. Extremities: No clubbing or cyanosis.  HEENT: Normal.    Telemetry   Atrial fibrillation 70s Personally reviewed   Labs    CBC Recent Labs    08/04/20 0800 08/04/20 0850 08/06/20 0347 08/06/20 0348 08/07/20 0408 08/07/20 0632 08/07/20 0633  WBC 18.1*   < > 21.2*  --  21.2*  --   --   NEUTROABS 13.2*  --   --   --   --   --   --   HGB 9.2*   < > 7.9*   < > 7.8* 10.2* 8.8*  HCT 29.8*   < > 25.8*   < > 25.1* 30.0* 26.0*  MCV 92.8   < > 94.2  --  93.7  --   --   PLT 158   < > 175  --  212  --   --    < > = values in this interval not displayed.   Basic Metabolic Panel Recent Labs    08/06/20 0347 08/06/20 0348 08/06/20 1703 08/06/20 1739 08/07/20 0413 08/07/20 0632 08/07/20 0633  NA 136  135   < > 136   < > 137 139 136  K 4.9  4.8   < > 4.7   < > 4.3 4.1 4.4  CL 97*  96*   < > 93*   < >  90* 86*  --   CO2 29  28  --  31  --  35*  --   --   GLUCOSE 134*  133*   < > 226*   < > 157* 199*  --   BUN 46*  45*   < > 43*   < > 40* 35*  --   CREATININE 2.28*  2.28*   < > 2.30*   < > 2.19* 1.70*  --   CALCIUM 8.5*  8.6*  --  8.9  --  9.1  --   --   MG 2.4  --   --   --  2.2  --   --   PHOS 4.1  --  3.9  --  3.5  --   --    < > = values in this interval not displayed.   Liver Function Tests Recent Labs    08/06/20 1703 08/07/20 0413  ALBUMIN 2.1* 2.1*   No results for input(s): LIPASE, AMYLASE in the last 72 hours. Cardiac Enzymes No results for input(s): CKTOTAL, CKMB, CKMBINDEX, TROPONINI in the last 72 hours.  BNP: BNP (last 3 results) Recent Labs    07/22/2020 1211  BNP 465.5*    ProBNP (last 3 results) No results for input(s): PROBNP in the last 8760 hours.   D-Dimer No results for input(s): DDIMER in the last 72 hours. Hemoglobin A1C No results for input(s): HGBA1C in the last 72 hours. Fasting Lipid Panel No results for input(s): CHOL, HDL, LDLCALC, TRIG, CHOLHDL, LDLDIRECT in the last 72  hours. Thyroid Function Tests No results for input(s): TSH, T4TOTAL, T3FREE, THYROIDAB in the last 72 hours.  Invalid input(s): FREET3  Other results:   Imaging    No results found.   Medications:     Scheduled Medications: . artificial tears  1 application Both Eyes L8G  . aspirin EC  325 mg Oral Daily   Or  . aspirin  324 mg Per Tube Daily  . B-complex with vitamin C  1 tablet Per Tube Daily  . bisacodyl  10 mg Oral Daily   Or  . bisacodyl  10 mg Rectal Daily  . chlorhexidine gluconate (MEDLINE KIT)  15 mL Mouth Rinse BID  . Chlorhexidine Gluconate Cloth  6 each Topical Daily  . clonazepam  0.25 mg Per Tube BID  . docusate  200 mg Per Tube Daily  . feeding supplement (PROSource TF)  45 mL Per Tube QID  . hydrALAZINE  25 mg Oral Q8H  . HYDROmorphone HCl  2 mg Per Tube Q6H  . insulin aspart  10 Units Subcutaneous Q4H  . insulin aspart  3-9 Units Subcutaneous Q4H  . insulin detemir  29 Units Subcutaneous Q12H  . levalbuterol  0.63 mg Nebulization Q6H  . mouth rinse  15 mL Mouth Rinse 10 times per day  . metoCLOPramide (REGLAN) injection  5 mg Intravenous Q8H  . midodrine  5 mg Per Tube TID WC  . pantoprazole sodium  40 mg Per Tube Daily  . polyethylene glycol  17 g Per Tube Daily  . QUEtiapine  25 mg Per Tube BID  . rosuvastatin  40 mg Per Tube Daily  . sodium chloride flush  10-40 mL Intracatheter Q12H    Infusions: .  prismasol BGK 4/2.5 500 mL/hr at 08/05/20 0800  . sodium chloride Stopped (08/01/20 1202)  . sodium chloride    . amiodarone 30 mg/hr (08/07/20 0600)  . calcium gluconate infusion for CRRT 20  g (08/06/20 1444)  . citrate dextrose 350 mL/hr at 08/07/20 0634  . dexmedetomidine (PRECEDEX) IV infusion 0.3 mcg/kg/hr (08/07/20 0606)  . dextrose    . feeding supplement (VITAL 1.5 CAL) 1,000 mL (08/07/20 0015)  . fentaNYL infusion INTRAVENOUS 75 mcg/hr (08/07/20 0600)  . heparin 1,500 Units/hr (08/07/20 0600)  . impella catheter heparin 50 unit/mL in  dextrose 5% 11.9 mL/hr at 07/31/20 0148  . norepinephrine (LEVOPHED) Adult infusion Stopped (08/06/20 1420)  . prismasol BGK 2/2.5 replacement solution 300 mL/hr at 08/07/20 0358  . prismasol BGK 4/2.5 1,500 mL/hr at 08/07/20 0310    PRN Medications: sodium chloride, Place/Maintain arterial line **AND** sodium chloride, acetaminophen, dextrose, fentaNYL, fentaNYL (SUBLIMAZE) injection, heparin, metoprolol tartrate, ondansetron (ZOFRAN) IV, sodium chloride, sodium chloride flush, traMADol  Assessment/Plan   1. Cardiogenic shock: Ischemic cardiomyopathy, post-CABG on 4/26.  He has Impella 5.5 in place, at P5 with no alarms.  Limited echo 4/29 with EF< 20%, the RV appears normal in size with severe systolic dysfunction.  Hypertensive off NE.  Co-ox lower at 51% today.  CVP 14 on CVVH pulling 100 cc/hr.  - Increased Impella back to P6 with low co-ox, flow up to 3.3 L/min.  Resend co-ox. Continue heparin gtt.  - Continue CVVH, pulling 100 cc/hr net UF.  - SBP up to 200s overnight, currently 160s => d/c midodrine and add hydralazine 25 mg tid.  2. CAD: s/p CABG x 5 with LIMA-LAD, seq SVG-D1/ramus, seq SVG-PDA/PLV.   - ASA - Crestor 3. Anemia: Post-op bleeding, back to OR with multiple products given on 4/26 post-CABG.  Hgb lower at 7.8.  - Transfuse < 7.5.  4. Thrombocytopenia: Resolved. suspect low post-op/post-surgical bleeding and multiple blood products as well as sepsis.  Stable with Impella.  5. PAD: Extensive history.  6. AAA: Monitoring as outpatient.  7. Type 2 DM: Insulin.  Hgb A1c was 11.6, poor control.  8. Atrial fibrillation: Rate-controlled.  Continue amiodarone gtt.    9. Neuro: intubated and sedated, not following commands with sedation wean.  Head CT 5/6 with multiple posterior circulation infarcts.   - Palliative care following, goals of care meeting with family planned for today.   - May need neurology consult.  10. ID:  PCT 7.5 on 4/29. WBCs elevated.  Has completed  cefepime. Tm 100.2 with WBCs still high at 21K.   - Depending on direction of family meeting this morning, may need to resume empiric antibiotic course.  11. Acute hypoxemic respiratory failure: Pulmonary edema, possible PNA.  On vent, unable to wean just yet with marked volume overload.   - Per CCM.  12. AKI:  CVVH started with marked volume overload and worsening renal function.  Avoid hypotension and excessive CVVH rates. Still volume overloaded and above baseline weight.  - Continue CVVH 100 cc/hr UF rate.  13. Ileus: Seems to be resolved, tube feeds ongoing.   I think we have reached the point where a meaningful outcome is no longer possible. Palliative care consulted to initiate [place] conversations.   CRITICAL CARE Performed by: Loralie Champagne  Total critical care time: 40 minutes  Critical care time was exclusive of separately billable procedures and treating other patients.  Critical care was necessary to treat or prevent imminent or life-threatening deterioration.  Critical care was time spent personally by me (independent of midlevel providers or residents) on the following activities: development of treatment plan with patient and/or surrogate as well as nursing, discussions with consultants, evaluation of patient's response to treatment,  examination of patient, obtaining history from patient or surrogate, ordering and performing treatments and interventions, ordering and review of laboratory studies, ordering and review of radiographic studies, pulse oximetry and re-evaluation of patient's condition.   Loralie Champagne MD 08/07/2020 6:55 AM

## 2020-08-07 NOTE — Progress Notes (Signed)
Admit: 07/16/2020 LOS: 78  83M progressive AKI after CABG with prolonged shock (cardiogenic and ? Vasodilatory/septic), failure to achieve target volume status despite aggressive diuretics.    Current CRRT Prescription: Start Date: 08/01/20 Catheter: L Fem Temp IJ CCM placed 5/3 BFR: 300 Pre Blood Pump: 500 4K DFR: 1500 4K Replacement Rate: 300 2K Goal UF: 173m net neg/h Anticoagulation: Citrate Clotting: None since transition to citrate  S: Stable on CRRT, Tolerating UF  -2.4 liters last 24 hours K 4.4, P 3.5 GOC meeting today with palliative care scheduled   O: 05/08 0701 - 05/09 0700 In: 4614.7 [I.V.:2798; NG/GT:1575] Out: 7031 [Chest Tube:40]  PE Eyes open but no commands, restless in bed Lungs-  CBS bilat- vent CV- RRR- fresh sternotomy Abd-  Distended Ext-  Pitting edema to dep areas   Filed Weights   08/03/20 0400 08/06/20 0615 08/07/20 0615  Weight: 125.3 kg 120.9 kg 117.7 kg    Recent Labs  Lab 08/06/20 0347 08/06/20 0348 08/06/20 1703 08/06/20 1739 08/06/20 2226 08/07/20 0413 08/07/20 0632 08/07/20 0633  NA 136  135   < > 136   < > 138 137 139 136  K 4.9  4.8   < > 4.7   < > 4.3 4.3 4.1 4.4  CL 97*  96*   < > 93*   < > 90* 90* 86*  --   CO2 29  28  --  31  --   --  35*  --   --   GLUCOSE 134*  133*   < > 226*   < > 200* 157* 199*  --   BUN 46*  45*   < > 43*   < > 34* 40* 35*  --   CREATININE 2.28*  2.28*   < > 2.30*   < > 1.60* 2.19* 1.70*  --   CALCIUM 8.5*  8.6*  --  8.9  --   --  9.1  --   --   PHOS 4.1  --  3.9  --   --  3.5  --   --    < > = values in this interval not displayed.   Recent Labs  Lab 08/04/20 0800 08/04/20 0850 08/05/20 0425 08/05/20 1401 08/06/20 0347 08/06/20 0348 08/07/20 0408 08/07/20 0632 08/07/20 0633  WBC 18.1*  --  23.7*  --  21.2*  --  21.2*  --   --   NEUTROABS 13.2*  --   --   --   --   --   --   --   --   HGB 9.2*   < > 8.6*   < > 7.9*   < > 7.8* 10.2* 8.8*  HCT 29.8*   < > 28.3*   < > 25.8*   <  > 25.1* 30.0* 26.0*  MCV 92.8  --  93.4  --  94.2  --  93.7  --   --   PLT 158  --  154  --  175  --  212  --   --    < > = values in this interval not displayed.    Scheduled Meds: . artificial tears  1 application Both Eyes QL8V . aspirin EC  325 mg Oral Daily   Or  . aspirin  324 mg Per Tube Daily  . B-complex with vitamin C  1 tablet Per Tube Daily  . bisacodyl  10 mg Oral Daily   Or  . bisacodyl  10 mg Rectal Daily  . chlorhexidine gluconate (MEDLINE KIT)  15 mL Mouth Rinse BID  . Chlorhexidine Gluconate Cloth  6 each Topical Daily  . clonazepam  0.25 mg Per Tube BID  . docusate  200 mg Per Tube Daily  . feeding supplement (PROSource TF)  45 mL Per Tube QID  . hydrALAZINE  25 mg Oral Q8H  . HYDROmorphone HCl  2 mg Per Tube Q6H  . insulin aspart  10 Units Subcutaneous Q4H  . insulin aspart  3-9 Units Subcutaneous Q4H  . insulin detemir  29 Units Subcutaneous Q12H  . levalbuterol  0.63 mg Nebulization Q6H  . mouth rinse  15 mL Mouth Rinse 10 times per day  . metoCLOPramide (REGLAN) injection  5 mg Intravenous Q8H  . pantoprazole sodium  40 mg Per Tube Daily  . polyethylene glycol  17 g Per Tube Daily  . QUEtiapine  25 mg Per Tube BID  . rosuvastatin  40 mg Per Tube Daily  . sodium chloride flush  10-40 mL Intracatheter Q12H   Continuous Infusions: .  prismasol BGK 4/2.5 500 mL/hr at 08/05/20 0800  . sodium chloride Stopped (08/01/20 1202)  . sodium chloride    . amiodarone 30 mg/hr (08/07/20 0700)  . calcium gluconate infusion for CRRT 20 g (08/07/20 0659)  . citrate dextrose 350 mL/hr at 08/07/20 0634  . dexmedetomidine (PRECEDEX) IV infusion 0.2 mcg/kg/hr (08/07/20 0700)  . dextrose    . feeding supplement (VITAL 1.5 CAL) 1,000 mL (08/07/20 0015)  . fentaNYL infusion INTRAVENOUS 75 mcg/hr (08/07/20 0700)  . heparin 1,500 Units/hr (08/07/20 0700)  . impella catheter heparin 50 unit/mL in dextrose 5% 11.9 mL/hr at 07/31/20 0148  . norepinephrine (LEVOPHED) Adult  infusion Stopped (08/06/20 1420)  . prismasol BGK 2/2.5 replacement solution 300 mL/hr at 08/07/20 0358  . prismasol BGK 4/2.5 1,500 mL/hr at 08/07/20 0657   PRN Meds:.sodium chloride, Place/Maintain arterial line **AND** sodium chloride, acetaminophen, dextrose, fentaNYL, fentaNYL (SUBLIMAZE) injection, heparin, metoprolol tartrate, ondansetron (ZOFRAN) IV, sodium chloride, sodium chloride flush, traMADol  ABG    Component Value Date/Time   PHART 7.430 08/07/2020 0633   PCO2ART 58.4 (H) 08/07/2020 0633   PO2ART 101 08/07/2020 0633   HCO3 38.6 (H) 08/07/2020 0633   TCO2 40 (H) 08/07/2020 0633   ACIDBASEDEF 2.0 08/05/2020 0400   O2SAT 98.0 08/07/2020 0633    A/P  1. Dialysis dependent AKI, progressive, nonoliguric 2/2 cardiorenal syndrome probably some ATN 2. Acute systolic CHF / ICM; impella, off inotropes; massive vol overload; slowly improving 3. CAD s/p  5V CABG 4/26 4. Anemia, mild, per primary 5. DM2 6. PAD hx/o Fem Pop Bypass 7. Diuretic Resistance despite lasix gtt and metolazone-  Volume status correcting with CRRT  8. Fevers,  9. VDRF 10. Hypophoshatemia on CRRT, stable 11. Numerous ischemic bilateral posterior CVAs, neurology following 12. Hyperkalemia, improved after placing post replacement bath at 2K   Continue CRRT at current settings - no repletion needed   agree with goals of care conversation, grim prognosis, support transition to comfort measures   Louis Meckel, MD  [place]

## 2020-08-07 NOTE — Progress Notes (Signed)
NAMEElliott Carpenter, MRN:  [id number], DOB:  [birth year], LOS: 28 ADMISSION DATE:  07/15/2020, CONSULTATION DATE: 07/28/2018 REFERRING MD: Dr. Orvan Seen, CHIEF COMPLAINT: Ventilator dependent  History of Present Illness:  Danny Carpenter is a 70 year old male with past medical history significant for hypertension, hyperlipidemia, type 2 diabetes, CVA, AAA, left foot amputation s/p fem pop bypass and heart failure who presented to the emergency department 4/21 after undergoing outpatient stress test that revealed EF of 20% and acute ischemia.  Since admission patient has been evaluated by cardiology and cardiothoracic surgery and patient underwent TEE and left heart cath.  LHC/RHC revealed severe three-vessel coronary artery disease with moderately elevated left heart and pulmonary artery pressures.  At that time patient was placed on goal-directed medical therapy for acute systolic congestive heart failure and CTS surgery was consulted for CABG potential.  Patient underwent CABG x5 with Dr. Orvan Seen 4/26, post CABG CODE BLUE called due to low flow on Impella, CTS notified.  Patient underwent bedside exploratory postoperative open heart where Dr. Julien Girt reopened the chest incision and hematoma was removed, patient's hemodynamics improved upon opening sternotomy.  PCCM consulted morning of 4/28 due to difficulty weaning ventilator  Pertinent  Medical History  Hypertension, hyperlipidemia, type 2 diabetes, CVA, AAA, left foot amputation s/p fem pop bypass and heart failure   Significant Hospital Events: Including procedures, antibiotic start and stop dates in addition to other pertinent events   . 4/21 presented to the ED for evaluation of abnormal stress test . 4/22 left and right heart cath severe multivessel disease . 4/26 CABG x5 with hemodynamic instability post requiring brief CPR with eventual return back to the OR anastomosis bleeding was seen . 4/28 remains ventilator dependent, slowly weaning  PEEP . 5/1 remains on vent, diuresing, no ready to come off  . 5/3 started on CRRT. . 5/5 requiring sedation for vent  . 5/6 CT scan shows multiple areas of posterior circulation infarction. . 5/8 tolerating CRRT with fluid removal.  Decreasing vasopressor requirements.  Interim History / Subjective:   Continues to require sedation to prevent nonpurposeful agitation.  No further bloody bowel movements.  Was able to tolerate decrease in Impella to P5 overnight, increased again to P6 for marginal SCVO2.   Objective   Blood pressure (!) 113/54, pulse 71, temperature 98.9 F (37.2 C), temperature source Oral, resp. rate (!) 21, height '6\' 1"'$  (1.854 m), weight 117.7 kg, SpO2 100 %. CVP:  [4 mmHg-38 mmHg] 4 mmHg  Vent Mode: PRVC FiO2 (%):  [40 %] 40 % Set Rate:  [20 bmp] 20 bmp Vt Set:  [630 mL] 630 mL PEEP:  [5 cmH20] 5 cmH20 Plateau Pressure:  [17 cmH20-27 cmH20] 24 cmH20   Intake/Output Summary (Last 24 hours) at 08/07/2020 1302 Last data filed at 08/07/2020 1200 Gross per 24 hour  Intake 4279.89 ml  Output 6725 ml  Net -2445.11 ml   Filed Weights   08/03/20 0400 08/06/20 0615 08/07/20 0615  Weight: 125.3 kg 120.9 kg 117.7 kg    Examination: General: Critically ill appearing older adult M intubated sedated NAD HEENT: ETT secure slightly icteric sclera Neuro: Sedated.  Will move head from side to side to voice. Spontaneously lifts left arm  CV: Impella p6. Cap refill < 3 sec. irreg rhythm. HS normal, chest tubes out and sternum well healed.  PULM: Mechanically ventilated. Symmetrical chest expansion. Diminished basilar sounds. Synchronous.  GI: Soft round, no tenderness.   GU: foley catheter in place, scrotal edema Extremities: Anasarca  improving. No acute joint deformity. No clubbing or cyanosis. L metatarsal amputation Skin: Line sites are intact  Labs/imaging that I have personally reviewed    CT head personally reviewed and shows multiple areas of brainstem and cerebellar  infarction.  Leukocytosis at 21.2, HB 8.2, PLT normal 212   ScvO2 46 on P5.  Echo today shows improved LVSF to EF 40%   Resolved Hospital Problem list     Assessment & Plan:   Critically ill due to acute hypoxic respiratory failure  R pleural effusion, mild pulmonary edema  Critically ill due to cardiogenic Shock requiring mechanical cardiac support Persistent encephalopathy-multifactorial including prolonged ICU stay and sedative infusions Evidence of acute posterior circulation CVA CAD sp CABG x5 AKI Colonic ileus Anemia, post op  Thrombocytopenia - stable  Low grade pyrexia and leukocus of unclear etiology PAD s/p partial L foot amputation  AAA Poorly controlled DM2 with microvascular complications.   Plan:  -Continue current ventilator strategy -Continue cardiac support primarily with Impella and continue fluid removal.  Remains on minimal norepinephrine -Continue aggressive fluid removal with CRRT targeting net -50-100 mL/h - decrease rate as developing a contraction alkalosis.  -Unable to fully stop sedation to assess best neurological examination.  - Family meeting to be arranged to direct [place]. Patient has made some improvement from cardiac standpoint, but neurological recovery remains an open question. Long term HD is a certainty. Reasonable to trach patient to allow for complete sedation interruption to see what best neurological examination to better determine most appropriate direction of care.    Best practice   Diet:  Tube Feed   Pain/Anxiety/Delirium protocol (if indicated): Yes (RASS goal -1) - fentanyl prn, wean Precedex as tolerated VAP protocol (if indicated): Yes DVT prophylaxis: Systemic AC - Heparin in Impella purge. GI prophylaxis: PPI Glucose control:  SSI Yes - SSI + basal + q4  Central venous access:  Yes, and it is still needed, Arterial line:  Yes, and it is still needed  Foley:  N/A - removed now that on CRRT Mobility:  bed rest  PT  consulted: N/A Last date of multidisciplinary goals of care discussion: I spoke with the patient's sons today 5/6 and informed them that he remains critically ill from a cardiovascular standpoint and that recovery is not guaranteed.  They also understand that presence of cerebral infarction would likely indicate a poor overall outcome and that this a transition to comfort care may be appropriate at that time.  Family meeting scheduled for 5/9 but no one showed up.  Code Status:  full code Disposition: ICU  Labs   CBC: Recent Labs  Lab 08/04/20 0304 08/04/20 0351 08/04/20 0800 08/04/20 0850 08/05/20 0425 08/05/20 1401 08/06/20 0347 08/06/20 0348 08/07/20 0408 08/07/20 [id number] 08/07/20 0841 08/07/20 1030 08/07/20 1032 08/07/20 1226 08/07/20 1229  WBC 17.4*  --  18.1*  --  23.7*  --  21.2*  --  21.2*  --   --   --   --   --   --   NEUTROABS  --   --  13.2*  --   --   --   --   --   --   --   --   --   --   --   --   HGB 8.7*   < > 9.2*   < > 8.6*   < > 7.9*   < > 7.8*   < > 9.2* 8.5* 9.2* 9.2* 8.2*  HCT 28.6*   < >  29.8*   < > 28.3*   < > 25.8*   < > 25.1*   < > 27.0* 25.0* 27.0* 27.0* 24.0*  MCV 93.2  --  92.8  --  93.4  --  94.2  --  93.7  --   --   --   --   --   --   PLT 140*  --  158  --  154  --  175  --  212  --   --   --   --   --   --    < > = values in this interval not displayed.    Basic Metabolic Panel: Recent Labs  Lab 08/03/20 0337 08/03/20 1619 08/04/20 0304 08/04/20 0351 08/05/20 0425 08/05/20 1652 08/05/20 1903 08/06/20 0347 08/06/20 0348 08/06/20 1703 08/06/20 1739 08/07/20 0413 08/07/20 [id number] 08/07/20 0841 08/07/20 1030 08/07/20 1032 08/07/20 1226 08/07/20 1229  NA 136   < > 135   < > 133*  133* 133*   < > 136  135   < > 136   < > 137   < > 136 137 137 138 136  K 4.6   < > 4.9   < > 5.5*  5.5* 5.7*   < > 4.9  4.8   < > 4.7   < > 4.3   < > 4.4 4.1 4.1 3.9 3.9  CL 101   < > 104   < > 102  102 101   < > 97*  96*   < > 93*   < > 90*   < > 87* 86*  85* 85* 89*  CO2 26   < > 24   < > '24  23 23  '$ --  29  28  --  31  --  35*  --   --   --   --   --   --   GLUCOSE 163*   < > 158*   < > 193*  192* 203*   < > 134*  133*   < > 226*   < > 157*   < > 189* 196* 211* 217* 189*  BUN 46*   < > 39*   < > 44*  43* 51*   < > 46*  45*   < > 43*   < > 40*   < > 38* 36* 33* 33* 35*  CREATININE 2.00*   < > 1.93*   < > 2.21*  2.17* 2.55*   < > 2.28*  2.28*   < > 2.30*   < > 2.19*   < > 1.90* 2.00* 1.70* 1.80* 2.10*  CALCIUM 7.9*   < > 7.7*   < > 7.5*  7.6* 7.7*  --  8.5*  8.6*  --  8.9  --  9.1  --   --   --   --   --   --   MG 2.5*  --  2.4  --  2.6*  --   --  2.4  --   --   --  2.2  --   --   --   --   --   --   PHOS 2.7   < > 3.5   < > 3.6 5.5*  --  4.1  --  3.9  --  3.5  --   --   --   --   --   --    < > =  values in this interval not displayed.   GFR: Estimated Creatinine Clearance: 45.2 mL/min (A) (by C-G formula based on SCr of 2.1 mg/dL (H)). Recent Labs  Lab 08/01/20 0819 08/02/20 0316 08/04/20 0800 08/05/20 0425 08/06/20 0347 08/07/20 0408  WBC  --    < > 18.1* 23.7* 21.2* 21.2*  LATICACIDVEN 0.8  --   --   --   --   --    < > = values in this interval not displayed.    Liver Function Tests: Recent Labs  Lab 08/05/20 0425 08/05/20 1652 08/06/20 0347 08/06/20 1703 08/07/20 0413  ALBUMIN 2.1* 2.1* 2.2* 2.1* 2.1*   No results for input(s): LIPASE, AMYLASE in the last 168 hours. No results for input(s): AMMONIA in the last 168 hours.  ABG    Component Value Date/Time   PHART 7.430 08/07/2020 0633   PCO2ART 58.4 (H) 08/07/2020 0633   PO2ART 101 08/07/2020 0633   HCO3 38.6 (H) 08/07/2020 0633   TCO2 38 (H) 08/07/2020 1229   ACIDBASEDEF 2.0 08/05/2020 0400   O2SAT 46.7 08/07/2020 0821     Coagulation Profile: No results for input(s): INR, PROTIME in the last 168 hours.  Cardiac Enzymes: No results for input(s): CKTOTAL, CKMB, CKMBINDEX, TROPONINI in the last 168 hours.  HbA1C: Hgb A1c MFr Bld  Date/Time Value  Ref Range Status  07/22/2020 06:28 PM 11.6 (H) 4.8 - 5.6 % Final    Comment:    (NOTE) Pre diabetes:          5.7%-6.4%  Diabetes:              >6.4%  Glycemic control for   <7.0% adults with diabetes   08/05/2019 03:09 AM 11.6 (H) 4.8 - 5.6 % Final    Comment:    (NOTE) Pre diabetes:          5.7%-6.4% Diabetes:              >6.4% Glycemic control for   <7.0% adults with diabetes     CBG: Recent Labs  Lab 08/06/20 1946 08/06/20 2353 08/07/20 0410 08/07/20 0821 08/07/20 1115  GLUCAP 209* 177* 148* 169* 240*    CRITICAL CARE Performed by: Kipp Brood   Total critical care time: 40 minutes  Critical care time was exclusive of separately billable procedures and treating other patients. Critical care was necessary to treat or prevent imminent or life-threatening deterioration.  Critical care was time spent personally by me on the following activities: development of treatment plan with patient and/or surrogate as well as nursing, discussions with consultants, evaluation of patient's response to treatment, examination of patient, obtaining history from patient or surrogate, ordering and performing treatments and interventions, ordering and review of laboratory studies, ordering and review of radiographic studies, pulse oximetry and re-evaluation of patient's condition.  Kipp Brood, MD [facility] ICU Physician [place]  Pager: [phone] Or Epic Secure Chat After hours: [phone].  08/07/2020, 1:02 PM      08/07/2020, 1:02 PM

## 2020-08-07 NOTE — Progress Notes (Signed)
eLink Physician-Brief Progress Note Patient Name: Danny Carpenter DOB: [birth year] MRN: [id number]   Date of Service  08/07/2020  HPI/Events of Note  RN requests flexiseal due to frequent liquid stools with skin breakdown now seen.  Platelets are > 100k. Patient is on heparin drip.   eICU Interventions  OK to insert rectal tube to mitigate risk of further skin breakdown.      Intervention Category Minor Interventions: Routine modifications to care plan (e.g. PRN medications for pain, fever)  Marily Lente Leo Weyandt 08/07/2020, 12:44 AM

## 2020-08-07 NOTE — Progress Notes (Signed)
[facility] for Heparin Indication: Impella 5.5  No Known Allergies  Patient Measurements: Height: '6\' 1"'$  (185.4 cm) Weight: 117.7 kg (259 lb 7.7 oz) IBW/kg (Calculated) : 79.9 Heparin Dosing Weight: 102.8 kg  Vital Signs: Temp: 98.9 F (37.2 C) (05/09 1116) Temp Source: Oral (05/09 1116) BP: 113/54 (05/09 1200) Pulse Rate: 71 (05/09 1200)  Labs: Recent Labs    08/05/20 0425 08/05/20 0430 08/06/20 0347 08/06/20 0348 08/06/20 1703 08/06/20 1739 08/07/20 0408 08/07/20 0413 08/07/20 1032 08/07/20 1226 08/07/20 1229  HGB 8.6*   < > 7.9*   < >  --    < > 7.8*   < > 9.2* 9.2* 8.2*  HCT 28.3*   < > 25.8*   < >  --    < > 25.1*   < > 27.0* 27.0* 24.0*  PLT 154  --  175  --   --   --  212  --   --   --   --   HEPARINUNFRC  --    < > 0.22*  --  0.12*  --  0.25*  --   --   --   --   CREATININE 2.21*  2.17*   < > 2.28*  2.28*   < > 2.30*   < >  --    < > 1.70* 1.80* 2.10*   < > = values in this interval not displayed.    Estimated Creatinine Clearance: 45.2 mL/min (A) (by C-G formula based on SCr of 2.1 mg/dL (H)).   Assessment: 70 yo M presents with NSTEMI and multivessel CAD, now s/p CABG with Impella support. Pharmacy asked to manage systemic heparin.  Heparin level is down to 0.24 within goal range on systemic heparin at 1500 units/hr + 590 units/hr from purge solution. Rectal bleeding has been ongoing but is stable, bloody sputum has resolved.  Hgb fairly stable, platelet count okay.  Goal of Therapy:  Heparin level 0.2-0.3 units/ml Monitor platelets by anticoagulation protocol: Yes   Plan:  Continue heparin purge solution (50 units/ml) Continue IV heparin to 1500 units/hr Check q12h heparin level for now   [place] Crane, Vena [place], BCPS, BCCP Clinical Pharmacist  08/07/2020 1:05 PM   [facility] pharmacy phone numbers are listed on [email or web address]

## 2020-08-08 ENCOUNTER — Inpatient Hospital Stay (HOSPITAL_COMMUNITY): Payer: No Typology Code available for payment source

## 2020-08-08 ENCOUNTER — Encounter (HOSPITAL_COMMUNITY): Admission: EM | Disposition: E | Payer: Self-pay | Source: Ambulatory Visit | Attending: Cardiothoracic Surgery

## 2020-08-08 DIAGNOSIS — Z951 Presence of aortocoronary bypass graft: Secondary | ICD-10-CM | POA: Diagnosis not present

## 2020-08-08 DIAGNOSIS — Z515 Encounter for palliative care: Secondary | ICD-10-CM | POA: Diagnosis not present

## 2020-08-08 DIAGNOSIS — I25700 Atherosclerosis of coronary artery bypass graft(s), unspecified, with unstable angina pectoris: Secondary | ICD-10-CM | POA: Diagnosis not present

## 2020-08-08 DIAGNOSIS — Z930 Tracheostomy status: Secondary | ICD-10-CM

## 2020-08-08 LAB — POCT I-STAT, CHEM 8
BUN: 30 mg/dL — ABNORMAL HIGH (ref 8–23)
BUN: 43 mg/dL — ABNORMAL HIGH (ref 8–23)
BUN: 31 mg/dL — ABNORMAL HIGH (ref 8–23)
BUN: 44 mg/dL — ABNORMAL HIGH (ref 8–23)
BUN: 31 mg/dL — ABNORMAL HIGH (ref 8–23)
BUN: 36 mg/dL — ABNORMAL HIGH (ref 8–23)
BUN: 30 mg/dL — ABNORMAL HIGH (ref 8–23)
BUN: 42 mg/dL — ABNORMAL HIGH (ref 8–23)
Calcium, Ion: 0.33 mmol/L — CL (ref 1.15–1.40)
Calcium, Ion: 1 mmol/L — ABNORMAL LOW (ref 1.15–1.40)
Calcium, Ion: 0.45 mmol/L — CL (ref 1.15–1.40)
Calcium, Ion: 0.99 mmol/L — ABNORMAL LOW (ref 1.15–1.40)
Calcium, Ion: 0.42 mmol/L — CL (ref 1.15–1.40)
Calcium, Ion: 0.97 mmol/L — ABNORMAL LOW (ref 1.15–1.40)
Calcium, Ion: 0.35 mmol/L — CL (ref 1.15–1.40)
Calcium, Ion: 0.97 mmol/L — ABNORMAL LOW (ref 1.15–1.40)
Chloride: 82 mmol/L — ABNORMAL LOW (ref 98–111)
Chloride: 86 mmol/L — ABNORMAL LOW (ref 98–111)
Chloride: 82 mmol/L — ABNORMAL LOW (ref 98–111)
Chloride: 85 mmol/L — ABNORMAL LOW (ref 98–111)
Chloride: 83 mmol/L — ABNORMAL LOW (ref 98–111)
Chloride: 83 mmol/L — ABNORMAL LOW (ref 98–111)
Chloride: 83 mmol/L — ABNORMAL LOW (ref 98–111)
Chloride: 85 mmol/L — ABNORMAL LOW (ref 98–111)
Creatinine, Ser: 1.6 mg/dL — ABNORMAL HIGH (ref 0.61–1.24)
Creatinine, Ser: 2 mg/dL — ABNORMAL HIGH (ref 0.61–1.24)
Creatinine, Ser: 1.8 mg/dL — ABNORMAL HIGH (ref 0.61–1.24)
Creatinine, Ser: 2.2 mg/dL — ABNORMAL HIGH (ref 0.61–1.24)
Creatinine, Ser: 1.9 mg/dL — ABNORMAL HIGH (ref 0.61–1.24)
Creatinine, Ser: 2.2 mg/dL — ABNORMAL HIGH (ref 0.61–1.24)
Creatinine, Ser: 1.7 mg/dL — ABNORMAL HIGH (ref 0.61–1.24)
Creatinine, Ser: 2.1 mg/dL — ABNORMAL HIGH (ref 0.61–1.24)
Glucose, Bld: 202 mg/dL — ABNORMAL HIGH (ref 70–99)
Glucose, Bld: 236 mg/dL — ABNORMAL HIGH (ref 70–99)
Glucose, Bld: 146 mg/dL — ABNORMAL HIGH (ref 70–99)
Glucose, Bld: 181 mg/dL — ABNORMAL HIGH (ref 70–99)
Glucose, Bld: 193 mg/dL — ABNORMAL HIGH (ref 70–99)
Glucose, Bld: 214 mg/dL — ABNORMAL HIGH (ref 70–99)
Glucose, Bld: 203 mg/dL — ABNORMAL HIGH (ref 70–99)
Glucose, Bld: 241 mg/dL — ABNORMAL HIGH (ref 70–99)
HCT: 25 % — ABNORMAL LOW (ref 39.0–52.0)
HCT: 25 % — ABNORMAL LOW (ref 39.0–52.0)
HCT: 23 % — ABNORMAL LOW (ref 39.0–52.0)
HCT: 27 % — ABNORMAL LOW (ref 39.0–52.0)
HCT: 23 % — ABNORMAL LOW (ref 39.0–52.0)
HCT: 24 % — ABNORMAL LOW (ref 39.0–52.0)
HCT: 26 % — ABNORMAL LOW (ref 39.0–52.0)
HCT: 28 % — ABNORMAL LOW (ref 39.0–52.0)
Hemoglobin: 8.5 g/dL — ABNORMAL LOW (ref 13.0–17.0)
Hemoglobin: 8.5 g/dL — ABNORMAL LOW (ref 13.0–17.0)
Hemoglobin: 7.8 g/dL — ABNORMAL LOW (ref 13.0–17.0)
Hemoglobin: 9.2 g/dL — ABNORMAL LOW (ref 13.0–17.0)
Hemoglobin: 7.8 g/dL — ABNORMAL LOW (ref 13.0–17.0)
Hemoglobin: 8.2 g/dL — ABNORMAL LOW (ref 13.0–17.0)
Hemoglobin: 8.8 g/dL — ABNORMAL LOW (ref 13.0–17.0)
Hemoglobin: 9.5 g/dL — ABNORMAL LOW (ref 13.0–17.0)
Potassium: 3.8 mmol/L (ref 3.5–5.1)
Potassium: 4.2 mmol/L (ref 3.5–5.1)
Potassium: 3.9 mmol/L (ref 3.5–5.1)
Potassium: 4.3 mmol/L (ref 3.5–5.1)
Potassium: 3.8 mmol/L (ref 3.5–5.1)
Potassium: 3.8 mmol/L (ref 3.5–5.1)
Potassium: 3.9 mmol/L (ref 3.5–5.1)
Potassium: 4.1 mmol/L (ref 3.5–5.1)
Sodium: 137 mmol/L (ref 135–145)
Sodium: 138 mmol/L (ref 135–145)
Sodium: 137 mmol/L (ref 135–145)
Sodium: 139 mmol/L (ref 135–145)
Sodium: 138 mmol/L (ref 135–145)
Sodium: 138 mmol/L (ref 135–145)
Sodium: 137 mmol/L (ref 135–145)
Sodium: 139 mmol/L (ref 135–145)
TCO2: 39 mmol/L — ABNORMAL HIGH (ref 22–32)
TCO2: 42 mmol/L — ABNORMAL HIGH (ref 22–32)
TCO2: 36 mmol/L — ABNORMAL HIGH (ref 22–32)
TCO2: 42 mmol/L — ABNORMAL HIGH (ref 22–32)
TCO2: 36 mmol/L — ABNORMAL HIGH (ref 22–32)
TCO2: 42 mmol/L — ABNORMAL HIGH (ref 22–32)
TCO2: 36 mmol/L — ABNORMAL HIGH (ref 22–32)
TCO2: 41 mmol/L — ABNORMAL HIGH (ref 22–32)

## 2020-08-08 LAB — MAGNESIUM: Magnesium: 2.3 mg/dL (ref 1.7–2.4)

## 2020-08-08 LAB — RENAL FUNCTION PANEL
Albumin: 2 g/dL — ABNORMAL LOW (ref 3.5–5.0)
Albumin: 2 g/dL — ABNORMAL LOW (ref 3.5–5.0)
Anion gap: 13 (ref 5–15)
Anion gap: 14 (ref 5–15)
BUN: 33 mg/dL — ABNORMAL HIGH (ref 8–23)
BUN: 33 mg/dL — ABNORMAL HIGH (ref 8–23)
CO2: 40 mmol/L — ABNORMAL HIGH (ref 22–32)
CO2: 41 mmol/L — ABNORMAL HIGH (ref 22–32)
Calcium: 9.1 mg/dL (ref 8.9–10.3)
Calcium: 8.9 mg/dL (ref 8.9–10.3)
Chloride: 85 mmol/L — ABNORMAL LOW (ref 98–111)
Chloride: 85 mmol/L — ABNORMAL LOW (ref 98–111)
Creatinine, Ser: 2.54 mg/dL — ABNORMAL HIGH (ref 0.61–1.24)
Creatinine, Ser: 2.29 mg/dL — ABNORMAL HIGH (ref 0.61–1.24)
GFR, Estimated: 27 mL/min — ABNORMAL LOW (ref >60)
GFR, Estimated: 30 mL/min — ABNORMAL LOW (ref >60)
Glucose, Bld: 188 mg/dL — ABNORMAL HIGH (ref 70–99)
Glucose, Bld: 191 mg/dL — ABNORMAL HIGH (ref 70–99)
Phosphorus: 2.9 mg/dL (ref 2.5–4.6)
Phosphorus: 3.7 mg/dL (ref 2.5–4.6)
Potassium: 4.1 mmol/L (ref 3.5–5.1)
Potassium: 3.8 mmol/L (ref 3.5–5.1)
Sodium: 138 mmol/L (ref 135–145)
Sodium: 140 mmol/L (ref 135–145)

## 2020-08-08 LAB — CULTURE, RESPIRATORY W GRAM STAIN: Culture: NORMAL

## 2020-08-08 LAB — GLUCOSE, CAPILLARY
Glucose-Capillary: 108 mg/dL — ABNORMAL HIGH (ref 70–99)
Glucose-Capillary: 170 mg/dL — ABNORMAL HIGH (ref 70–99)
Glucose-Capillary: 170 mg/dL — ABNORMAL HIGH (ref 70–99)
Glucose-Capillary: 171 mg/dL — ABNORMAL HIGH (ref 70–99)
Glucose-Capillary: 184 mg/dL — ABNORMAL HIGH (ref 70–99)
Glucose-Capillary: 198 mg/dL — ABNORMAL HIGH (ref 70–99)

## 2020-08-08 LAB — CBC
HCT: 25 % — ABNORMAL LOW (ref 39.0–52.0)
Hemoglobin: 7.5 g/dL — ABNORMAL LOW (ref 13.0–17.0)
MCH: 28.1 pg (ref 26.0–34.0)
MCHC: 30 g/dL (ref 30.0–36.0)
MCV: 93.6 fL (ref 80.0–100.0)
Platelets: 236 10*3/uL (ref 150–400)
RBC: 2.67 MIL/uL — ABNORMAL LOW (ref 4.22–5.81)
RDW: 18.7 % — ABNORMAL HIGH (ref 11.5–15.5)
WBC: 18.1 10*3/uL — ABNORMAL HIGH (ref 4.0–10.5)
nRBC: 0.2 % (ref 0.0–0.2)

## 2020-08-08 LAB — COOXEMETRY PANEL
Carboxyhemoglobin: 1.4 % (ref 0.5–1.5)
Methemoglobin: 1.2 % (ref 0.0–1.5)
O2 Saturation: 53.2 %
Total hemoglobin: 8.2 g/dL — ABNORMAL LOW (ref 12.0–16.0)

## 2020-08-08 LAB — ECHOCARDIOGRAM LIMITED
Height: 73 in
Weight: 4151.7 oz

## 2020-08-08 LAB — PROCALCITONIN: Procalcitonin: 1.19 ng/mL

## 2020-08-08 LAB — LACTATE DEHYDROGENASE: LDH: 384 U/L — ABNORMAL HIGH (ref 98–192)

## 2020-08-08 LAB — CALCIUM, IONIZED: Calcium, Ionized, Serum: 4.4 mg/dL — ABNORMAL LOW (ref 4.5–5.6)

## 2020-08-08 LAB — HEPARIN LEVEL (UNFRACTIONATED): Heparin Unfractionated: 0.13 IU/mL — ABNORMAL LOW (ref 0.30–0.70)

## 2020-08-08 SURGERY — CREATION, TRACHEOSTOMY
Anesthesia: General

## 2020-08-08 MED ORDER — PRISMASOL BGK 4/2.5 32-4-2.5 MEQ/L REPLACEMENT SOLN: Status: DC

## 2020-08-08 MED ORDER — MIDODRINE HCL 5 MG PO TABS
5.0000 mg | ORAL_TABLET | Freq: Three times a day (TID) | ORAL | Status: DC
  Administered 2020-08-08 – 2020-08-10 (×6): 5 mg
  Filled 2020-08-08 (×5): qty 1

## 2020-08-08 MED ORDER — HEPARIN (PORCINE) 25000 UT/250ML-% IV SOLN
1200.0000 [IU]/h | INTRAVENOUS | Status: DC
  Administered 2020-08-09 (×2): 1550 [IU]/h via INTRAVENOUS
  Administered 2020-08-10 – 2020-08-11 (×2): 1650 [IU]/h via INTRAVENOUS
  Administered 2020-08-12 (×2): 1600 [IU]/h via INTRAVENOUS
  Administered 2020-08-13: 1550 [IU]/h via INTRAVENOUS
  Administered 2020-08-14: 1200 [IU]/h via INTRAVENOUS
  Administered 2020-08-16 – 2020-08-17 (×2): 1150 [IU]/h via INTRAVENOUS
  Filled 2020-08-08 (×10): qty 250

## 2020-08-08 MED ORDER — VECURONIUM BROMIDE 10 MG IV SOLR
10.0000 mg | Freq: Once | INTRAVENOUS | Status: AC
  Administered 2020-08-08: 10 mg via INTRAVENOUS
  Filled 2020-08-08: qty 10

## 2020-08-08 MED ORDER — MIDAZOLAM HCL 2 MG/2ML IJ SOLN
5.0000 mg | Freq: Once | INTRAMUSCULAR | Status: AC
  Administered 2020-08-08: 5 mg via INTRAVENOUS
  Filled 2020-08-08: qty 6

## 2020-08-08 MED ORDER — ACETAMINOPHEN 160 MG/5ML PO SOLN
650.0000 mg | Freq: Four times a day (QID) | ORAL | Status: DC | PRN
  Administered 2020-08-08 – 2020-08-23 (×22): 650 mg
  Filled 2020-08-08 (×19): qty 20.3

## 2020-08-08 MED ORDER — DARBEPOETIN ALFA 150 MCG/0.3ML IJ SOSY
150.0000 ug | PREFILLED_SYRINGE | INTRAMUSCULAR | Status: DC
  Filled 2020-08-08: qty 0.3

## 2020-08-08 MED ORDER — DARBEPOETIN ALFA 150 MCG/0.3ML IJ SOSY
150.0000 ug | PREFILLED_SYRINGE | INTRAMUSCULAR | Status: DC
  Administered 2020-08-09 – 2020-08-16 (×2): 150 ug via SUBCUTANEOUS
  Filled 2020-08-08 (×3): qty 0.3

## 2020-08-08 MED ORDER — SODIUM PHOSPHATES 45 MMOLE/15ML IV SOLN
20.0000 mmol | Freq: Once | INTRAVENOUS | Status: AC
  Administered 2020-08-08: 20 mmol via INTRAVENOUS
  Filled 2020-08-08: qty 6.67

## 2020-08-08 MED ORDER — SODIUM CHLORIDE 0.9 % IV SOLN
1.0000 g | Freq: Three times a day (TID) | INTRAVENOUS | Status: DC
  Administered 2020-08-08 – 2020-08-12 (×13): 1 g via INTRAVENOUS
  Filled 2020-08-08 (×15): qty 1

## 2020-08-08 MED ORDER — MIDODRINE HCL 5 MG PO TABS
5.0000 mg | ORAL_TABLET | Freq: Three times a day (TID) | ORAL | Status: DC
  Administered 2020-08-08: 5 mg via ORAL

## 2020-08-08 MED ORDER — PROSOURCE TF PO LIQD
45.0000 mL | Freq: Every day | ORAL | Status: DC
  Administered 2020-08-08 – 2020-08-22 (×67): 45 mL
  Filled 2020-08-08 (×64): qty 45

## 2020-08-08 NOTE — Progress Notes (Addendum)
Patient ID: Danny Carpenter, male   DOB: 1950-09-25, 70 y.o.   MRN: [id number]     Advanced Heart Failure Rounding Note  PCP-Cardiologist: None   Subjective:    - 4/26 S/P CABG - 5/4 Swan removed with ongoing fevers, CVVH started - CT head on 5/6 with multiple posterior circulation infarcts.   Remains intubated/sedated.   CVVH ongoing, weight down, pulling 100 cc/hr net negative. CVP 15-16  Weight still up.    Impella P6  Flow 3.3  No alarms Waveforms ok  LDH 209 => 247 => 215 => 247=>329=>350 => 477 => 370=>366=> 408 => 324 => 385=> 384 Position of Impella 08/07/20 adequate Co-ox lower today at 53% on decreased Impella speed with hypertension   Objective:   Weight Range: 114 kg Body mass index is 33.16 kg/m.   Vital Signs:   Temp:  [98.9 F (37.2 C)-99.6 F (37.6 C)] 99.6 F (37.6 C) (05/10 0346) Pulse Rate:  [60-83] 68 (05/10 0700) Resp:  [16-26] 20 (05/10 0700) BP: (82-149)/(34-90) 105/71 (05/10 0700) SpO2:  [93 %-100 %] 99 % (05/10 0700) Arterial Line BP: (76-174)/(39-73) 117/54 (05/10 0700) FiO2 (%):  [40 %] 40 % (05/10 0400) Weight:  [710 kg] 114 kg (05/10 0500) Last BM Date: 08/07/20  Weight change: Filed Weights   08/06/20 0615 08/07/20 0615 08/20/2020 0500  Weight: 120.9 kg 117.7 kg 114 kg    Intake/Output:   Intake/Output Summary (Last 24 hours) at 08/03/2020 0722 Last data filed at 08/01/2020 0700 Gross per 24 hour  Intake 3706.82 ml  Output 7647 ml  Net -3940.18 ml      Physical Exam  CVP 15-16  General:  Intubated  HEENT: normal Neck: supple. no JVD. Carotids 2+ bilat; no bruits. No lymphadenopathy or thryomegaly appreciated. Cor: PMI nondisplaced. Irregular rate & rhythm. No rubs, gallops or murmurs. Lungs: Rhonchi throughout  Abdomen: soft, nontender, nondistended. No hepatosplenomegaly. No bruits or masses. Good bowel sounds. Extremities: no cyanosis, clubbing, rash, edema Neuro: Intubated   Telemetry   A fib 70s   Labs     CBC Recent Labs    08/06/20 0347 08/06/20 0348 08/07/20 0408 08/07/20 0632 08/12/2020 0059 08/21/2020 0100  WBC 21.2*  --  21.2*  --   --   --   HGB 7.9*   < > 7.8*   < > 8.5* 8.5*  HCT 25.8*   < > 25.1*   < > 25.0* 25.0*  MCV 94.2  --  93.7  --   --   --   PLT 175  --  212  --   --   --    < > = values in this interval not displayed.   Basic Metabolic Panel Recent Labs    08/07/20 0413 08/07/20 0632 08/07/20 1623 08/07/20 1627 08/14/2020 0100 08/21/2020 0433  NA 137   < > 138   < > 138 138  K 4.3   < > 4.3   < > 3.8 4.1  CL 90*   < > 89*   < > 82* 85*  CO2 35*  --  38*  --   --  40*  GLUCOSE 157*   < > 181*   < > 236* 188*  BUN 40*   < > 35*   < > 30* 33*  CREATININE 2.19*   < > 2.13*   < > 1.60* 2.54*  CALCIUM 9.1  --  9.3  --   --  9.1  MG 2.2  --   --   --   --  2.3  PHOS 3.5  --  3.6  --   --  2.9   < > = values in this interval not displayed.   Liver Function Tests Recent Labs    08/07/20 1623 08/14/2020 0433  ALBUMIN 2.1* 2.0*   No results for input(s): LIPASE, AMYLASE in the last 72 hours. Cardiac Enzymes No results for input(s): CKTOTAL, CKMB, CKMBINDEX, TROPONINI in the last 72 hours.  BNP: BNP (last 3 results) Recent Labs    07/21/2020 1211  BNP 465.5*    ProBNP (last 3 results) No results for input(s): PROBNP in the last 8760 hours.   D-Dimer No results for input(s): DDIMER in the last 72 hours. Hemoglobin A1C No results for input(s): HGBA1C in the last 72 hours. Fasting Lipid Panel No results for input(s): CHOL, HDL, LDLCALC, TRIG, CHOLHDL, LDLDIRECT in the last 72 hours. Thyroid Function Tests No results for input(s): TSH, T4TOTAL, T3FREE, THYROIDAB in the last 72 hours.  Invalid input(s): FREET3  Other results:   Imaging    No results found.   Medications:     Scheduled Medications: . artificial tears  1 application Both Eyes V5I  . aspirin EC  325 mg Oral Daily   Or  . aspirin  324 mg Per Tube Daily  . B-complex with  vitamin C  1 tablet Per Tube Daily  . bisacodyl  10 mg Oral Daily   Or  . bisacodyl  10 mg Rectal Daily  . chlorhexidine gluconate (MEDLINE KIT)  15 mL Mouth Rinse BID  . Chlorhexidine Gluconate Cloth  6 each Topical Daily  . clonazepam  0.25 mg Per Tube BID  . docusate  200 mg Per Tube Daily  . feeding supplement (PROSource TF)  45 mL Per Tube QID  . HYDROmorphone HCl  2 mg Per Tube Q6H  . insulin aspart  10 Units Subcutaneous Q4H  . insulin aspart  3-9 Units Subcutaneous Q4H  . insulin detemir  29 Units Subcutaneous Q12H  . levalbuterol  0.63 mg Nebulization Q6H  . mouth rinse  15 mL Mouth Rinse 10 times per day  . metoCLOPramide (REGLAN) injection  5 mg Intravenous Q8H  . pantoprazole sodium  40 mg Per Tube Daily  . polyethylene glycol  17 g Per Tube Daily  . QUEtiapine  25 mg Per Tube BID  . rosuvastatin  40 mg Per Tube Daily  . sodium chloride flush  10-40 mL Intracatheter Q12H    Infusions: .  prismasol BGK 4/2.5 500 mL/hr at 08/05/20 0800  . sodium chloride Stopped (08/01/20 1202)  . sodium chloride    . amiodarone 30 mg/hr (08/01/2020 0700)  . calcium gluconate infusion for CRRT 20 g (08/11/2020 0326)  . citrate dextrose 390 mL/hr at 08/07/20 1419  . dexmedetomidine (PRECEDEX) IV infusion 0.3 mcg/kg/hr (08/17/2020 0700)  . dextrose    . feeding supplement (VITAL 1.5 CAL) 1,000 mL (08/07/20 0015)  . fentaNYL infusion INTRAVENOUS 50 mcg/hr (08/07/2020 0700)  . heparin 1,500 Units/hr (08/17/2020 0700)  . impella catheter heparin 50 unit/mL in dextrose 5% 11.9 mL/hr at 07/31/20 0148  . norepinephrine (LEVOPHED) Adult infusion 12 mcg/min (08/10/2020 0700)  . prismasol BGK 2/2.5 replacement solution 300 mL/hr at 08/07/20 2122  . prismasol BGK 4/2.5 1,500 mL/hr at 08/18/2020 0642    PRN Medications: sodium chloride, Place/Maintain arterial line **AND** sodium chloride, acetaminophen, dextrose, fentaNYL, fentaNYL (SUBLIMAZE) injection, heparin, metoprolol tartrate, ondansetron (ZOFRAN)  IV, sodium chloride, sodium chloride flush, traMADol  Assessment/Plan   1. Cardiogenic  shock: Ischemic cardiomyopathy, post-CABG on 4/26.  He has Impella 5.5 in place, at P5 with no alarms.  Limited echo 4/29 with EF< 20%, the RV appears normal in size with severe systolic dysfunction. Now back on NE 12. Echo with some improvement, EF in 30-35% range on 5/9 echo. Co-ox lower at 53% today.  ?Component of septic/distributive shock with increased pressor requirement today. CVP remains 16.  - With LV function looking a bit better, decrease Impella to P5.  Position looked ok on echo yesterday. Continue heparin gtt.  - Continue CVVH, pulling 100 cc/hr net UF.  2. CAD: s/p CABG x 5 with LIMA-LAD, seq SVG-D1/ramus, seq SVG-PDA/PLV.   - ASA - Crestor 3. Anemia: Post-op bleeding, back to OR with multiple products given on 4/26 post-CABG.  Hgb 8.5  - Transfuse < 7.5.  4. Thrombocytopenia: Resolved. suspect low post-op/post-surgical bleeding and multiple blood products as well as sepsis.  Check CBC  5. PAD: Extensive history.  6. AAA: Monitoring as outpatient.  7. Type 2 DM: Insulin.  Hgb A1c was 11.6, poor control.  8. Atrial fibrillation: Rate controlled.  Continue amiodarone gtt.    9. Neuro: intubated and sedated, not following commands with sedation wean.  Head CT 5/6 with multiple posterior circulation infarcts.   - Palliative Care following.  - Repeat head CT today.  10. ID:  PCT 7.5 on 4/29. WBCs elevated.  Has completed cefepime. Tm 99.6  with WBCs still high at 21K.   - Depending on direction of family meeting this morning, may need to resume empiric antibiotic course.  11. Acute hypoxemic respiratory failure: Pulmonary edema, possible PNA.  On vent, unable to wean just yet with marked volume overload.  Sputum- Mod GNR Few gram +. ?Component of septic shock now.  - CXR today.  - Procalcitonin.  - Discuss initiation of abx with CCM.  12. AKI:  CVVH started with marked volume overload and  worsening renal function.  Avoid hypotension and excessive CVVH rates. Still volume overloaded and above baseline weight. CVP 16  - Continue CVVH 100 cc/hr UF rate.  13. Ileus: Seems to be resolved, tube feeds ongoing.   Palliative Care following for GOC. Impella to P5 per Dr Aundra Dubin. Check CBC. CXR today.   Amy Clegg NP-C  08/10/2020 7:22 AM  Patient seen with NP, agree with the above note.   Family meeting yesterday, plan currently to move forward with trach/PEG to try to more easily wean sedation and get a better idea of neurologic status.   Back on NE at 12, Impella at P6.  Good position of Impella on echo yesterday.  Weight down 8 lbs with CVVH but CVP still 16.  No CBC yet this morning.  Sputum culture with GNRs.   Few short NSVT runs.   General: Eyes open, on vent.  Neck:JVP 14 cm, thick, no thyromegaly or thyroid nodule.  Lungs: Rhonchi.  CV: Nondisplaced PMI.  Heart regular S1/S2, no S3/S4, no murmur.  1+ edema to knees. Abdomen: Soft, nontender, no hepatosplenomegaly, no distention.  Skin: Intact without lesions or rashes.  Neurologic: Will not respond/follow commands but eyes open.  Extremities: No clubbing or cyanosis.  HEENT: Normal.   Echo yesterday showed good Impella position, EF looked better in the 30-35% range.  Today, requiring more norepinephrine.  ?Developing component of septic shock with GNRs in sputum. Still volume up with CVP 16.  - CXR today.  - Will decrease Impella slowly, P5 for now. Continue heparin gtt.  -  Continue CVVH pulling 100 cc/hr.  - Wean NE as MAP allows (currently ok), add back midodrine 5 mg tid.  - Send PCT, with GNRs in sputum and concern for distributive shock component, will add meropenem if ok with CCM (had cefepime course already).   Still not responsive.  Repeat head CT today.  If not worse, plan for eventual trach/PEG to allow easier sedation weaning to determine neurologic status.   CRITICAL CARE Performed by: Loralie Champagne  Total critical care time: 35 minutes  Critical care time was exclusive of separately billable procedures and treating other patients.  Critical care was necessary to treat or prevent imminent or life-threatening deterioration.  Critical care was time spent personally by me on the following activities: development of treatment plan with patient and/or surrogate as well as nursing, discussions with consultants, evaluation of patient's response to treatment, examination of patient, obtaining history from patient or surrogate, ordering and performing treatments and interventions, ordering and review of laboratory studies, ordering and review of radiographic studies, pulse oximetry and re-evaluation of patient's condition.  Loralie Champagne 08/02/2020 7:53 AM

## 2020-08-08 NOTE — Progress Notes (Signed)
[facility] for Heparin Indication: Impella 5.5  No Known Allergies  Patient Measurements: Height: '6\' 1"'$  (185.4 cm) Weight: 114 kg (251 lb 5.2 oz) IBW/kg (Calculated) : 79.9 Heparin Dosing Weight: 102.8 kg  Vital Signs: Temp: 99.6 F (37.6 C) (05/10 0346) Temp Source: Axillary (05/10 0346) BP: 105/71 (05/10 0700) Pulse Rate: 68 (05/10 0700)  Labs: Recent Labs    08/06/20 0347 08/06/20 0348 08/07/20 0408 08/07/20 0413 08/07/20 1623 08/07/20 1627 08/01/2020 0059 07/30/2020 0100 08/07/2020 0433 08/28/2020 0443  HGB 7.9*   < > 7.8*   < >  --  8.5* 8.5* 8.5*  --   --   HCT 25.8*   < > 25.1*   < >  --  25.0* 25.0* 25.0*  --   --   PLT 175  --  212  --   --   --   --   --   --   --   HEPARINUNFRC 0.22*   < > 0.25*  --  0.22*  --   --   --   --  0.13*  CREATININE 2.28*  2.28*   < >  --    < > 2.13* 2.00* 2.00* 1.60* 2.54*  --    < > = values in this interval not displayed.    Estimated Creatinine Clearance: 36.8 mL/min (A) (by C-G formula based on SCr of 2.54 mg/dL (H)).   Assessment: 70 yo M presents with NSTEMI and multivessel CAD, now s/p CABG with Impella support. Pharmacy asked to manage systemic heparin.  Heparin level is below goal on 1500 units/hr + 585 units/hr from purge solution. Rectal bleeding has been ongoing but is stable, bloody sputum has resolved.  No new bleeding issues or concerns noted per nursing.   Goal of Therapy:  Heparin level 0.2-0.3 units/ml Monitor platelets by anticoagulation protocol: Yes   Plan:  Continue heparin purge solution (50 units/ml) Increase IV Heparin to 1550 units/hr Check q12h heparin level for now  [place] Crane, Roylene Reason, [facility] Clinical Pharmacist  08/01/2020 7:40 AM   [facility] pharmacy phone numbers are listed on [email or web address]

## 2020-08-08 NOTE — Progress Notes (Signed)
14 Days Post-Op Procedure(s) (LRB): EXPLORATION POST OPERATIVE OPEN HEART (N/A) Subjective: sedated  Objective: Vital signs in last 24 hours: Temp:  [98.9 F (37.2 C)-101.3 F (38.5 C)] 101.3 F (38.5 C) (05/10 0807) Pulse Rate:  [60-83] 83 (05/10 0900) Cardiac Rhythm: Atrial fibrillation (05/09 2000) Resp:  [17-26] 19 (05/10 0900) BP: (82-149)/(34-92) 125/92 (05/10 0900) SpO2:  [93 %-100 %] 99 % (05/10 0900) Arterial Line BP: (76-174)/(39-73) 125/52 (05/10 0900) FiO2 (%):  [40 %] 40 % (05/10 0810) Weight:  PE:5023248 kg] 114 kg (05/10 0500)  Hemodynamic parameters for last 24 hours: CVP:  [4 mmHg-18 mmHg] 16 mmHg  Intake/Output from previous day: 05/09 0701 - 05/10 0700 In: 3706.8 [I.V.:2770.9; NG/GT:660] Out: 7647 [Urine:540; Stool:550] Intake/Output this shift: Total I/O In: 456.4 [I.V.:235.6; Other:21.6; NG/GT:110; IV Piggyback:89.2] Out: 546 [Other:546]  General appearance: distracted Neurologic: right sided weakness Heart: regular rate and rhythm, S1, S2 normal, no murmur, click, rub or gallop Lungs: clear to auscultation bilaterally Abdomen: soft, non-tender; bowel sounds normal; no masses,  no organomegaly Extremities: extremities normal, atraumatic, no cyanosis or edema Wound: c/d/i  Lab Results: Recent Labs    08/07/20 0408 08/07/20 0632 08/15/2020 0100 08/11/2020 0747  WBC 21.2*  --   --  18.1*  HGB 7.8*   < > 8.5* 7.5*  HCT 25.1*   < > 25.0* 25.0*  PLT 212  --   --  236   < > = values in this interval not displayed.   BMET:  Recent Labs    08/07/20 1623 08/07/20 1627 08/16/2020 0100 08/28/2020 0433  NA 138   < > 138 138  K 4.3   < > 3.8 4.1  CL 89*   < > 82* 85*  CO2 38*  --   --  40*  GLUCOSE 181*   < > 236* 188*  BUN 35*   < > 30* 33*  CREATININE 2.13*   < > 1.60* 2.54*  CALCIUM 9.3  --   --  9.1   < > = values in this interval not displayed.    PT/INR: No results for input(s): LABPROT, INR in the last 72 hours. ABG    Component Value Date/Time    PHART 7.430 08/07/2020 0633   HCO3 38.6 (H) 08/07/2020 0633   TCO2 39 (H) 08/03/2020 0100   ACIDBASEDEF 2.0 08/05/2020 0400   O2SAT 53.2 08/19/2020 0439   CBG (last 3)  Recent Labs    08/07/20 2359 08/20/2020 0340 08/10/2020 0758  GLUCAP 170* 170* 171*    Assessment/Plan: S/P Procedure(s) (LRB): EXPLORATION POST OPERATIVE OPEN HEART (N/A) trach/g-tube today  F/u repeat head CT   LOS: 19 days    Wonda Olds 08/09/2020

## 2020-08-08 NOTE — Progress Notes (Signed)
Nutrition Follow-up  DOCUMENTATION CODES:   Not applicable  INTERVENTION:    ContinueTube Feeding via Cortrak:  Vital 1.5 at 55 ml/hr Increase Pro-source TF 45 mL 5 times daily Provides 2180 kcals, 144 g of protein and 1003 mL of free water  Continue B-complex with C  If pt with significant output via rectal tube, consider reducing scheduled bowel regimen   NUTRITION DIAGNOSIS:   Inadequate oral intake related to acute illness as evidenced by NPO status.  Being addressed via TF   GOAL:   Patient will meet greater than or equal to 90% of their needs  Progressing   MONITOR:   Vent status,TF tolerance,Labs,Weight trends  REASON FOR ASSESSMENT:   Ventilator    ASSESSMENT:   70 yo male admitted with abnormal stress test with chest pain, SOB, elevated troponin with acute on chronic CHF. PMH includes HTN, HLD, DM, CVA, AAA, left food amputation, CHF  4/26 CABG x 5, brief CPR, Intubated 4/29 Cortrak inserted, gastric 5/03 CRRT initiated 5/06 CT head: multiple posterior circulation infarcts  CRRT continues, UF 100 ml/hr. Impella remains in place Pt remains on vent support, sedate with fentanyl and precedex. Remains on levophed, stable requirements  Noted plan for trach/PEG  Tolerating Vital 1.5 at 55 ml/hr  Current wt 114 kg-trending down, Admission weight 107.6 kg. Pt with 2-3+ edema  Labs: reviewed Meds: B-complex with C, aranesp, colace, dulcolax, ss novolog, levemir, reglan, miralax, sodium phosphate    Diet Order:   Diet Order            Diet NPO time specified  Diet effective now                 EDUCATION NEEDS:   Not appropriate for education at this time  Skin:  Skin Assessment: Reviewed RN Assessment  Last BM:  5/9 rectal tube  Height:   Ht Readings from Last 1 Encounters:  08/03/20 '6\' 1"'$  (1.854 m)    Weight:   Wt Readings from Last 1 Encounters:  08/29/2020 114 kg    BMI:  Body mass index is 33.16 kg/m.  Estimated  Nutritional Needs:   Kcal:  2100-2300 kcals  Protein:  130-150 g  Fluid:  >/= 2 L   Kerman Passey MS, RDN, LDN, CNSC Registered Dietitian III Clinical Nutrition RD Pager and On-Call Pager Number Located in [place]

## 2020-08-08 NOTE — Progress Notes (Signed)
Admit: 07/12/2020 LOS: 57  72M progressive AKI after CABG with prolonged shock (cardiogenic and ? Vasodilatory/septic), failure to achieve target volume status despite aggressive diuretics.    Current CRRT Prescription: Start Date: 08/01/20 Catheter: L Fem Temp IJ CCM placed 5/3 BFR: 300 Pre Blood Pump: 500 4K DFR: 1500 4K Replacement Rate: 300 4K Goal UF: 189m net neg/h Anticoagulation: Citrate Clotting: None since transition to citrate  S: Stable on CRRT, Tolerating UF  -3.9 liters last 24 hours K 4.1, P 2.9 GOC meeting -  Now partial code but wanting to proceed with PEG and trach and continue to monitor for improvement   O: 05/09 0701 - 05/10 0700 In: 3706.8 [I.V.:2770.9; NG/GT:660] Out: 7647 [Urine:540; Stool:550]  PE Eyes open but no commands, restless in bed Lungs-  CBS bilat- vent CV- RRR- fresh sternotomy Abd-  Distended Ext-  Pitting edema to dep areas   Filed Weights   08/06/20 0615 08/07/20 0615 08/27/2020 0500  Weight: 120.9 kg 117.7 kg 114 kg    Recent Labs  Lab 08/07/20 0413 08/07/20 0632 08/07/20 1623 08/07/20 1627 08/26/2020 0059 08/03/2020 0100 08/27/2020 0433  NA 137   < > 138   < > 137 138 138  K 4.3   < > 4.3   < > 4.2 3.8 4.1  CL 90*   < > 89*   < > 86* 82* 85*  CO2 35*  --  38*  --   --   --  40*  GLUCOSE 157*   < > 181*   < > 202* 236* 188*  BUN 40*   < > 35*   < > 43* 30* 33*  CREATININE 2.19*   < > 2.13*   < > 2.00* 1.60* 2.54*  CALCIUM 9.1  --  9.3  --   --   --  9.1  PHOS 3.5  --  3.6  --   --   --  2.9   < > = values in this interval not displayed.   Recent Labs  Lab 08/04/20 0800 08/04/20 0850 08/05/20 0425 08/05/20 1401 08/06/20 0347 08/06/20 0348 08/07/20 0408 08/07/20 0638705/09/22 1627 08/15/2020 0059 08/19/2020 0100  WBC 18.1*  --  23.7*  --  21.2*  --  21.2*  --   --   --   --   NEUTROABS 13.2*  --   --   --   --   --   --   --   --   --   --   HGB 9.2*   < > 8.6*   < > 7.9*   < > 7.8*   < > 8.5* 8.5* 8.5*  HCT 29.8*   <  > 28.3*   < > 25.8*   < > 25.1*   < > 25.0* 25.0* 25.0*  MCV 92.8  --  93.4  --  94.2  --  93.7  --   --   --   --   PLT 158  --  154  --  175  --  212  --   --   --   --    < > = values in this interval not displayed.    Scheduled Meds: . artificial tears  1 application Both Eyes QF6E . aspirin EC  325 mg Oral Daily   Or  . aspirin  324 mg Per Tube Daily  . B-complex with vitamin C  1 tablet Per Tube Daily  . bisacodyl  10 mg Oral Daily   Or  . bisacodyl  10 mg Rectal Daily  . chlorhexidine gluconate (MEDLINE KIT)  15 mL Mouth Rinse BID  . Chlorhexidine Gluconate Cloth  6 each Topical Daily  . clonazepam  0.25 mg Per Tube BID  . docusate  200 mg Per Tube Daily  . feeding supplement (PROSource TF)  45 mL Per Tube QID  . HYDROmorphone HCl  2 mg Per Tube Q6H  . insulin aspart  10 Units Subcutaneous Q4H  . insulin aspart  3-9 Units Subcutaneous Q4H  . insulin detemir  29 Units Subcutaneous Q12H  . levalbuterol  0.63 mg Nebulization Q6H  . mouth rinse  15 mL Mouth Rinse 10 times per day  . metoCLOPramide (REGLAN) injection  5 mg Intravenous Q8H  . pantoprazole sodium  40 mg Per Tube Daily  . polyethylene glycol  17 g Per Tube Daily  . QUEtiapine  25 mg Per Tube BID  . rosuvastatin  40 mg Per Tube Daily  . sodium chloride flush  10-40 mL Intracatheter Q12H   Continuous Infusions: .  prismasol BGK 4/2.5 500 mL/hr at 08/05/20 0800  . sodium chloride Stopped (08/01/20 1202)  . sodium chloride    . amiodarone 30 mg/hr (08/13/2020 0700)  . calcium gluconate infusion for CRRT 20 g (08/14/2020 0326)  . citrate dextrose 390 mL/hr at 08/07/20 1419  . dexmedetomidine (PRECEDEX) IV infusion 0.3 mcg/kg/hr (08/12/2020 0700)  . dextrose    . feeding supplement (VITAL 1.5 CAL) 1,000 mL (08/07/20 0015)  . fentaNYL infusion INTRAVENOUS 50 mcg/hr (08/13/2020 0700)  . heparin 1,500 Units/hr (08/29/2020 0700)  . impella catheter heparin 50 unit/mL in dextrose 5% 11.9 mL/hr at 07/31/20 0148  .  norepinephrine (LEVOPHED) Adult infusion 12 mcg/min (08/21/2020 0700)  . prismasol BGK 2/2.5 replacement solution 300 mL/hr at 08/07/20 2122  . prismasol BGK 4/2.5 1,500 mL/hr at 08/11/2020 0642   PRN Meds:.sodium chloride, Place/Maintain arterial line **AND** sodium chloride, acetaminophen, dextrose, fentaNYL, fentaNYL (SUBLIMAZE) injection, heparin, metoprolol tartrate, ondansetron (ZOFRAN) IV, sodium chloride, sodium chloride flush, traMADol  ABG    Component Value Date/Time   PHART 7.430 08/07/2020 0633   PCO2ART 58.4 (H) 08/07/2020 0633   PO2ART 101 08/07/2020 0633   HCO3 38.6 (H) 08/07/2020 0633   TCO2 39 (H) 07/30/2020 0100   ACIDBASEDEF 2.0 08/05/2020 0400   O2SAT 53.2 08/06/2020 0439    A/P  1. Dialysis dependent AKI, progressive, nonoliguric 2/2 cardiorenal syndrome probably some ATN 2. Acute systolic CHF / ICM; impella, off inotropes; massive vol overload; slowly improving 3. CAD s/p  5V CABG 4/26 4. Anemia-  Will add ESA  5. DM2 6. PAD hx/o Fem Pop Bypass 7. Diuretic Resistance despite lasix gtt and metolazone-  Volume status correcting with CRRT  8. Fevers,  9. VDRF 10. Hypophoshatemia on CRRT, will replete today  11. Numerous ischemic bilateral posterior CVAs, neurology following 12. Hyperkalemia, improved after placing post replacement bath at 2K--- now will change all baths to 4 K on 08/16/2020   Continue CRRT -  Change all baths to 4 K   agree with goals of care conversation, grim prognosis.  Partial code-  But proceeding with peg and trach.  Therefore if he survives and remains RRT dep his only long term dispo option is [place], [place]  [employer]

## 2020-08-08 NOTE — Procedures (Signed)
Diagnostic Bronchoscopy  Danny Carpenter  [id number]  [birth year]  Date:08/09/2020  Time:3:29 PM   Provider Performing:Teria Khachatryan R Torry Istre   Procedure: Diagnostic Bronchoscopy [id number])  Indication(s) Assist with direct visualization of tracheostomy placement  Consent Risks of the procedure as well as the alternatives and risks of each were explained to the patient and/or caregiver.  Consent for the procedure was obtained.   Anesthesia See separate tracheostomy note   Time Out Verified patient identification, verified procedure, site/side was marked, verified correct patient position, special equipment/implants available, medications/allergies/relevant history reviewed, required imaging and test results available.   Sterile Technique Usual hand hygiene, masks, gowns, and gloves were used   Procedure Description Bronchoscope advanced through endotracheal tube and into airway.  After suctioning out tracheal secretions, bronchoscope used to provide direct visualization of tracheostomy placement.   Complications/Tolerance None; patient tolerated the procedure well.   EBL None  Specimen(s) None  [name], PA-C

## 2020-08-08 NOTE — Progress Notes (Signed)
Patient ID: Danny Carpenter, male   DOB: 19-May-1950, 70 y.o.   MRN: [id number]  TCTS Evening Rounds:   Hemodynamically stable on NE 15. Impella turned down to P4 today.  Trached today by CCM. Remains on vent.  CRRT   CBC    Component Value Date/Time   WBC 18.1 (H) 08/27/2020 0747   RBC 2.67 (L) 08/23/2020 0747   HGB 8.8 (L) 08/20/2020 1729   HCT 26.0 (L) 08/07/2020 1729   PLT 236 08/29/2020 0747   MCV 93.6 08/27/2020 0747   MCH 28.1 08/10/2020 0747   MCHC 30.0 07/31/2020 0747   RDW 18.7 (H) 08/17/2020 0747   LYMPHSABS 1.7 08/04/2020 0800   MONOABS 1.2 (H) 08/04/2020 0800   EOSABS 0.7 (H) 08/04/2020 0800   BASOSABS 0.1 08/04/2020 0800     BMET    Component Value Date/Time   NA 137 08/25/2020 1729   K 4.1 08/20/2020 1729   CL 85 (L) 08/28/2020 1729   CO2 41 (H) 08/13/2020 1600   GLUCOSE 203 (H) 08/27/2020 1729   BUN 42 (H) 08/07/2020 1729   CREATININE 2.10 (H) 08/09/2020 1729   CALCIUM 8.9 08/17/2020 1600   GFRNONAA 30 (L) 08/10/2020 1600   GFRAA >60 08/08/2019 0352     A/P:  Continue current plans

## 2020-08-08 NOTE — Progress Notes (Signed)
Pt transported to CT and back to 2H13 without any complications.

## 2020-08-08 NOTE — Progress Notes (Signed)
    Progress Note from the Palliative Medicine Team at [facility]   Patient Name: Danny Carpenter       Date: 08/21/2020 DOB: 19-Sep-1950  Age: 70 y.o. MRN#: [id number] Attending Physician: Wonda Olds, MD Primary Care Physician: Serita Grammes, MD Admit Date: 07/19/2020   Medical records reviewed   70 y.o. male  admitted on 07/22/2020 with  past medical history significant for hypertension, hyperlipidemia, type 2 diabetes, CVA, AAA, left foot amputation s/p fem pop bypass and heart failure who presented to the emergency department 4/21 after undergoing outpatient stress test that revealed EF of 20% and acute ischemia.  Since admission patient has been evaluated by cardiology and cardiothoracic surgery and patient underwent TEE and left heart cath. LHC/RHC revealed severe three-vessel coronary artery disease with moderately elevated left heart and pulmonary artery pressures. At that time patient was placed on goal-directed medical therapy for acute systolic congestive heart failure and CTS surgery was consulted for CABG potential.  Patient underwent CABG x5 with Dr. Orvan Seen 4/26, post CABG CODE BLUE called due to low flow on Impella, CTS notified. Patient underwent bedside exploratory postoperative open heart where Dr. Julien Girt reopened the chest incision and hematoma was removed, patient's hemodynamics improved upon opening sternotomy.  PCCM consulted morning of 4/28 due to difficulty weaning ventilator.  He was trach today 08-07-20  CRRT initiated on 08/01/2020, currently remains on CRRT tolerating fluid removal.  Head CT on 08/04/2020 significant for multiple areas of posterior circulation infarct.  Per CCM note patient has made some improvement from cardiac standpoint, but neurologic recovery remains uncertain.  Long-term HD is a certainty.  Decision made yesterday with  large family meeting to proceed with trach,, PEG and continued current medical interventions in hopes of further  evaluation neurologic standing for Danny Carpenter.     This NP visited patient at the bedside as a follow up for palliative medicine needs and emotional support.   No family at bedside.     Spoke to [employer] by [employer] for family  Family understands the seriousness of the current medical/situation.  They remain open to ongoing medical interventions and are open to all offered and available medical interventions to prolong life at this time. Family is hopeful for improvement.  Questions and concerns addressed.  Emotional support offered.  Discussed with family  the importance of continued conversation with each other  and the  medical providers regarding overall plan of care and treatment options,  ensuring decisions are within the context of the patients values and GOCs.  All agree that quality is of utmost importance to [name]  Questions and concerns addressed     Total time spent on the unit was 25 minutes  Greater than 50% of the time was spent in counseling and coordination of care  Wadie Lessen NP  Palliative Medicine Team Team Phone # [phone] Pager [phone]

## 2020-08-08 NOTE — Procedures (Signed)
Percutaneous Tracheostomy Procedure Note   Danny Carpenter  [id number]  [birth year]  Date:08/09/2020  Time:3:39 PM   Provider Performing:Dontavia Brand  Procedure: Percutaneous Tracheostomy with Bronchoscopic Guidance ([id number])  Indication(s)  prolonged mechanical ventilation.  Inability to wean sedation  Consent Risks of the procedure as well as the alternatives and risks of each were explained to the patient and/or caregiver.  Consent for the procedure was obtained.  Anesthesia Etomidate, Versed, Fentanyl, Vecuronium   Time Out Verified patient identification, verified procedure, site/side was marked, verified correct patient position, special equipment/implants available, medications/allergies/relevant history reviewed, required imaging and test results available.   Sterile Technique Maximal sterile technique including sterile barrier drape, hand hygiene, sterile gown, sterile gloves, mask, hair covering.    Procedure Description Appropriate anatomy identified by palpation.  Patient's neck prepped and draped in sterile fashion.  1% lidocaine with epinephrine was used to anesthetize skin overlying neck.  1.5cm incision made and blunt dissection performed until tracheal rings could be easily palpated.   Then a size 6 cuffed Shiley tracheostomy was placed under bronchoscopic visualization using usual Seldinger technique and serial dilation.   Bronchoscope confirmed placement above the carina.  Tracheostomy was sutured in place with adhesive pad to protect skin under pressure.    Patient connected to ventilator.   Complications/Tolerance None; patient tolerated the procedure well. Chest X-ray is ordered to confirm no post-procedural complication.   EBL Minimal   Specimen(s) None   Kipp Brood, MD [facility] ICU Physician [place]  Pager: [phone] Or Epic Secure Chat After hours: [phone].  08/02/2020, 3:40 PM

## 2020-08-08 NOTE — Progress Notes (Signed)
ANTICOAGULATION CONSULT NOTE  Pharmacy Consult for Heparin Indication: Impella 5.5  No Known Allergies  Patient Measurements: Height: '6\' 1"'$  (185.4 cm) Weight: 114 kg (251 lb 5.2 oz) IBW/kg (Calculated) : 79.9 Heparin Dosing Weight: 102.8 kg  Vital Signs: Temp: 98.9 F (37.2 C) (05/10 1546) Temp Source: Oral (05/10 1546) BP: 147/48 (05/10 1700) Pulse Rate: 69 (05/10 1700)  Labs: Recent Labs    08/06/20 0347 08/06/20 0348 08/07/20 0408 08/07/20 0413 08/07/20 1623 08/07/20 1627 08/25/2020 0443 08/26/2020 0747 08/28/2020 1304 08/06/2020 1306 08/22/2020 1503 08/06/2020 1512 07/30/2020 1600  HGB 7.9*   < > 7.8*   < >  --    < >  --  7.5*   < > 7.8* 8.2* 7.8*  --   HCT 25.8*   < > 25.1*   < >  --    < >  --  25.0*   < > 23.0* 24.0* 23.0*  --   PLT 175  --  212  --   --   --   --  236  --   --   --   --   --   HEPARINUNFRC 0.22*   < > 0.25*  --  0.22*  --  0.13*  --   --   --   --   --   --   CREATININE 2.28*  2.28*   < >  --    < > 2.13*   < >  --   --    < > 2.20* 1.90* 2.20* 2.29*   < > = values in this interval not displayed.    Estimated Creatinine Clearance: 40.8 mL/min (A) (by C-G formula based on SCr of 2.29 mg/dL (H)).   Assessment: 70 yo M presents with NSTEMI and multivessel CAD, now s/p CABG with Impella support. Pharmacy asked to manage systemic heparin.  Heparin level is below goal on 1500 units/hr + 585 units/hr from purge solution. Rectal bleeding has been ongoing but is stable, bloody sputum has resolved. Heparin held for trach, ok to restart in 4 hours per CCM.  Goal of Therapy:  Heparin level 0.2-0.3 units/ml Monitor platelets by anticoagulation protocol: Yes   Plan:  Continue heparin purge solution (50 units/ml) Resume IV Heparin 1550 units/hr ~1930 Check q12h heparin level for now - next tomorrow am  Arrie Senate, PharmD, BCPS, [facility] Clinical Pharmacist [phone] Please check AMION for all [place] numbers 08/19/2020

## 2020-08-08 NOTE — Progress Notes (Signed)
NAMEDevaun Carpenter, MRN:  [id number], DOB:  25-Jan-1951, LOS: 85 ADMISSION DATE:  07/05/2020, CONSULTATION DATE: 07/28/2018 REFERRING MD: Dr. Orvan Seen, CHIEF COMPLAINT: Ventilator dependent  History of Present Illness:  Danny Carpenter is a 70 year old male with past medical history significant for hypertension, hyperlipidemia, type 2 diabetes, CVA, AAA, left foot amputation s/p fem pop bypass and heart failure who presented to the emergency department 4/21 after undergoing outpatient stress test that revealed EF of 20% and acute ischemia.  Since admission patient has been evaluated by cardiology and cardiothoracic surgery and patient underwent TEE and left heart cath.  LHC/RHC revealed severe three-vessel coronary artery disease with moderately elevated left heart and pulmonary artery pressures.  At that time patient was placed on goal-directed medical therapy for acute systolic congestive heart failure and CTS surgery was consulted for CABG potential.  Patient underwent CABG x5 with Dr. Orvan Seen 4/26, post CABG CODE BLUE called due to low flow on Impella, CTS notified.  Patient underwent bedside exploratory postoperative open heart where Dr. Julien Girt reopened the chest incision and hematoma was removed, patient's hemodynamics improved upon opening sternotomy.  PCCM consulted morning of 4/28 due to difficulty weaning ventilator  Pertinent  Medical History  Hypertension, hyperlipidemia, type 2 diabetes, CVA, AAA, left foot amputation s/p fem pop bypass and heart failure   Significant Hospital Events: Including procedures, antibiotic start and stop dates in addition to other pertinent events   . 4/21 presented to the ED for evaluation of abnormal stress test . 4/22 left and right heart cath severe multivessel disease . 4/26 CABG x5 with hemodynamic instability post requiring brief CPR with eventual return back to the OR anastomosis bleeding was seen . 4/28 remains ventilator dependent, slowly weaning  PEEP . 5/1 remains on vent, diuresing, no ready to come off  . 5/3 started on CRRT. . 5/5 requiring sedation for vent  . 5/6 CT scan shows multiple areas of posterior circulation infarction. . 5/8 tolerating CRRT with fluid removal.  Decreasing vasopressor requirements.  Interim History / Subjective:   Increasing vasopressor requirements overnight.  Febrile.  Started on meropenem for gram-negative rods in sputum.  Objective   Blood pressure (!) 133/58, pulse 65, temperature (!) 101.4 F (38.6 C), temperature source Oral, resp. rate 20, height '6\' 1"'$  (1.854 m), weight 114 kg, SpO2 100 %. CVP:  [10 mmHg-18 mmHg] 16 mmHg  Vent Mode: PRVC FiO2 (%):  [40 %] 40 % Set Rate:  [20 bmp] 20 bmp Vt Set:  [630 mL] 630 mL PEEP:  [5 cmH20] 5 cmH20 Pressure Support:  [12 cmH20] 12 cmH20 Plateau Pressure:  [13 cmH20-31 cmH20] 23 cmH20   Intake/Output Summary (Last 24 hours) at 08/17/2020 1531 Last data filed at 08/29/2020 1500 Gross per 24 hour  Intake 3826.59 ml  Output 6663 ml  Net -2836.41 ml   Filed Weights   08/06/20 0615 08/07/20 0615 08/02/2020 0500  Weight: 120.9 kg 117.7 kg 114 kg    Examination: General: Critically ill appearing older adult M intubated sedated NAD HEENT: ETT secure slightly icteric sclera Neuro: Sedated.  Will move head from side to side when stimulated. Spontaneously lifts left arm  CV: Impella p4. Cap refill < 3 sec. irreg rhythm. HS normal, chest tubes out and sternum well healed.  PULM: Mechanically ventilated. Symmetrical chest expansion. Diminished basilar sounds. Synchronous with ventilator. GI: Soft round, no tenderness.   GU: foley catheter in place, scrotal edema Extremities: Anasarca improving. No acute joint deformity. No clubbing or cyanosis. L  metatarsal amputation Skin: Line sites are intact  Labs/imaging that I have personally reviewed    CT head personally reviewed and shows multiple areas of brainstem and cerebellar infarction-unchanged from  prior scan.  Leukocytosis improving at 18.1, HB 7.8 PLT normal 212   Echo today shows improved LVSF to EF 40%   Resolved Hospital Problem list     Assessment & Plan:   Critically ill due to acute hypoxic respiratory failure  R pleural effusion, mild pulmonary edema  Critically ill due to cardiogenic Shock requiring mechanical cardiac support Persistent encephalopathy-multifactorial including prolonged ICU stay and sedative infusions Evidence of acute posterior circulation CVA CAD sp CABG x5 AKI Colonic ileus Anemia, post op  Thrombocytopenia - stable  Low grade pyrexia and leukocus of unclear etiology PAD s/p partial L foot amputation  AAA Poorly controlled DM2 with microvascular complications.   Plan:  -Continue current ventilator strategy -Continue cardiac support primarily with Impella and continue fluid removal.  Wean Impella as tolerated follow SCV O2 -Continue aggressive fluid removal with CRRT targeting net -50-100 mL/h - decrease rate as developing a contraction alkalosis.  -Tracheostomy today to permit interruption of all sedatives and attempt to obtain best exam. -Continue meropenem for presumed HCAP.   Best practice   Diet:  Tube Feed   Pain/Anxiety/Delirium protocol (if indicated): Yes (RASS goal -1) - fentanyl prn, wean Precedex as tolerated VAP protocol (if indicated): Yes DVT prophylaxis: Systemic AC - Heparin in Impella purge. GI prophylaxis: PPI Glucose control:  SSI Yes - SSI + basal + q4  Central venous access:  Yes, and it is still needed, Arterial line:  Yes, and it is still needed  Foley:  N/A - removed now that on CRRT Mobility:  bed rest  PT consulted: N/A Last date of multidisciplinary goals of care discussion: I spoke with the patient's sons today 5/6 and informed them that he remains critically ill from a cardiovascular standpoint and that recovery is not guaranteed.  They also understand that presence of cerebral infarction would likely  indicate a poor overall outcome and that this a transition to comfort care may be appropriate at that time.  Code Status:  full code Disposition: ICU  Labs   CBC: Recent Labs  Lab 08/04/20 0800 08/04/20 0850 08/05/20 0425 08/05/20 1401 08/06/20 0347 08/06/20 0348 08/07/20 0408 08/07/20 [id number] 08/26/2020 0059 08/05/2020 0100 08/03/2020 0747 08/09/2020 1304 08/23/2020 1306  WBC 18.1*  --  23.7*  --  21.2*  --  21.2*  --   --   --  18.1*  --   --   NEUTROABS 13.2*  --   --   --   --   --   --   --   --   --   --   --   --   HGB 9.2*   < > 8.6*   < > 7.9*   < > 7.8*   < > 8.5* 8.5* 7.5* 9.2* 7.8*  HCT 29.8*   < > 28.3*   < > 25.8*   < > 25.1*   < > 25.0* 25.0* 25.0* 27.0* 23.0*  MCV 92.8  --  93.4  --  94.2  --  93.7  --   --   --  93.6  --   --   PLT 158  --  154  --  175  --  212  --   --   --  236  --   --    < > =  values in this interval not displayed.    Basic Metabolic Panel: Recent Labs  Lab 08/04/20 0304 08/04/20 0351 08/05/20 0425 08/05/20 1652 08/06/20 0347 08/06/20 0348 08/06/20 1703 08/06/20 1739 08/07/20 0413 08/07/20 [id number] 08/07/20 1623 08/07/20 1627 08/09/2020 0059 08/26/2020 0100 08/20/2020 0433 08/17/2020 1304 07/30/2020 1306  NA 135   < > 133*  133*   < > 136  135   < > 136   < > 137   < > 138   < > 137 138 138 139 137  K 4.9   < > 5.5*  5.5*   < > 4.9  4.8   < > 4.7   < > 4.3   < > 4.3   < > 4.2 3.8 4.1 3.9 4.3  CL 104   < > 102  102   < > 97*  96*   < > 93*   < > 90*   < > 89*   < > 86* 82* 85* 82* 85*  CO2 24   < > 24  23   < > 29  28  --  31  --  35*  --  38*  --   --   --  40*  --   --   GLUCOSE 158*   < > 193*  192*   < > 134*  133*   < > 226*   < > 157*   < > 181*   < > 202* 236* 188* 181* 146*  BUN 39*   < > 44*  43*   < > 46*  45*   < > 43*   < > 40*   < > 35*   < > 43* 30* 33* 31* 44*  CREATININE 1.93*   < > 2.21*  2.17*   < > 2.28*  2.28*   < > 2.30*   < > 2.19*   < > 2.13*   < > 2.00* 1.60* 2.54* 1.80* 2.20*  CALCIUM 7.7*   < > 7.5*  7.6*   <  > 8.5*  8.6*  --  8.9  --  9.1  --  9.3  --   --   --  9.1  --   --   MG 2.4  --  2.6*  --  2.4  --   --   --  2.2  --   --   --   --   --  2.3  --   --   PHOS 3.5   < > 3.6   < > 4.1  --  3.9  --  3.5  --  3.6  --   --   --  2.9  --   --    < > = values in this interval not displayed.   GFR: Estimated Creatinine Clearance: 42.5 mL/min (A) (by C-G formula based on SCr of 2.2 mg/dL (H)). Recent Labs  Lab 08/05/20 0425 08/06/20 0347 08/07/20 0408 08/02/2020 0747  PROCALCITON  --   --   --  1.19  WBC 23.7* 21.2* 21.2* 18.1*    Liver Function Tests: Recent Labs  Lab 08/06/20 0347 08/06/20 1703 08/07/20 0413 08/07/20 1623 08/17/2020 0433  ALBUMIN 2.2* 2.1* 2.1* 2.1* 2.0*   No results for input(s): LIPASE, AMYLASE in the last 168 hours. No results for input(s): AMMONIA in the last 168 hours.  ABG    Component Value Date/Time   PHART 7.430 08/07/2020 0633   PCO2ART 58.4 (H)  08/07/2020 0633   PO2ART 101 08/07/2020 0633   HCO3 38.6 (H) 08/07/2020 0633   TCO2 42 (H) 08/10/2020 1306   ACIDBASEDEF 2.0 08/05/2020 0400   O2SAT 53.2 08/22/2020 0439     Coagulation Profile: No results for input(s): INR, PROTIME in the last 168 hours.  Cardiac Enzymes: No results for input(s): CKTOTAL, CKMB, CKMBINDEX, TROPONINI in the last 168 hours.  HbA1C: Hgb A1c MFr Bld  Date/Time Value Ref Range Status  07/18/2020 06:28 PM 11.6 (H) 4.8 - 5.6 % Final    Comment:    (NOTE) Pre diabetes:          5.7%-6.4%  Diabetes:              >6.4%  Glycemic control for   <7.0% adults with diabetes   08/05/2019 03:09 AM 11.6 (H) 4.8 - 5.6 % Final    Comment:    (NOTE) Pre diabetes:          5.7%-6.4% Diabetes:              >6.4% Glycemic control for   <7.0% adults with diabetes     CBG: Recent Labs  Lab 08/07/20 1931 08/07/20 2359 08/21/2020 0340 08/26/2020 0758 08/06/2020 1138  GLUCAP 158* 170* 170* 171* 108*    CRITICAL CARE Performed by: Kipp Brood   Total critical care time:  40 minutes  Critical care time was exclusive of separately billable procedures and treating other patients. Critical care was necessary to treat or prevent imminent or life-threatening deterioration.  Critical care was time spent personally by me on the following activities: development of treatment plan with patient and/or surrogate as well as nursing, discussions with consultants, evaluation of patient's response to treatment, examination of patient, obtaining history from patient or surrogate, ordering and performing treatments and interventions, ordering and review of laboratory studies, ordering and review of radiographic studies, pulse oximetry and re-evaluation of patient's condition.  Kipp Brood, MD [facility] ICU Physician [place]  Pager: [phone] Or Epic Secure Chat After hours: [phone].  08/11/2020, 3:31 PM      08/29/2020, 3:31 PM

## 2020-08-09 ENCOUNTER — Inpatient Hospital Stay (HOSPITAL_COMMUNITY): Payer: No Typology Code available for payment source

## 2020-08-09 DIAGNOSIS — I63433 Cerebral infarction due to embolism of bilateral posterior cerebral arteries: Secondary | ICD-10-CM | POA: Diagnosis not present

## 2020-08-09 DIAGNOSIS — R4182 Altered mental status, unspecified: Secondary | ICD-10-CM | POA: Diagnosis not present

## 2020-08-09 DIAGNOSIS — I25700 Atherosclerosis of coronary artery bypass graft(s), unspecified, with unstable angina pectoris: Secondary | ICD-10-CM | POA: Diagnosis not present

## 2020-08-09 DIAGNOSIS — Z951 Presence of aortocoronary bypass graft: Secondary | ICD-10-CM | POA: Diagnosis not present

## 2020-08-09 DIAGNOSIS — Z515 Encounter for palliative care: Secondary | ICD-10-CM | POA: Diagnosis not present

## 2020-08-09 DIAGNOSIS — J9690 Respiratory failure, unspecified, unspecified whether with hypoxia or hypercapnia: Secondary | ICD-10-CM

## 2020-08-09 DIAGNOSIS — R570 Cardiogenic shock: Secondary | ICD-10-CM | POA: Diagnosis not present

## 2020-08-09 LAB — RENAL FUNCTION PANEL
Albumin: 2.1 g/dL — ABNORMAL LOW (ref 3.5–5.0)
Albumin: 2 g/dL — ABNORMAL LOW (ref 3.5–5.0)
Anion gap: 14 (ref 5–15)
Anion gap: 15 (ref 5–15)
BUN: 30 mg/dL — ABNORMAL HIGH (ref 8–23)
BUN: 30 mg/dL — ABNORMAL HIGH (ref 8–23)
CO2: 40 mmol/L — ABNORMAL HIGH (ref 22–32)
CO2: 40 mmol/L — ABNORMAL HIGH (ref 22–32)
Calcium: 9.1 mg/dL (ref 8.9–10.3)
Calcium: 9.1 mg/dL (ref 8.9–10.3)
Chloride: 86 mmol/L — ABNORMAL LOW (ref 98–111)
Chloride: 86 mmol/L — ABNORMAL LOW (ref 98–111)
Creatinine, Ser: 2.11 mg/dL — ABNORMAL HIGH (ref 0.61–1.24)
Creatinine, Ser: 2 mg/dL — ABNORMAL HIGH (ref 0.61–1.24)
GFR, Estimated: 33 mL/min — ABNORMAL LOW (ref >60)
GFR, Estimated: 36 mL/min — ABNORMAL LOW (ref >60)
Glucose, Bld: 220 mg/dL — ABNORMAL HIGH (ref 70–99)
Glucose, Bld: 155 mg/dL — ABNORMAL HIGH (ref 70–99)
Phosphorus: 3.6 mg/dL (ref 2.5–4.6)
Phosphorus: 2.9 mg/dL (ref 2.5–4.6)
Potassium: 4.3 mmol/L (ref 3.5–5.1)
Potassium: 4.2 mmol/L (ref 3.5–5.1)
Sodium: 140 mmol/L (ref 135–145)
Sodium: 141 mmol/L (ref 135–145)

## 2020-08-09 LAB — POCT I-STAT, CHEM 8
BUN: 28 mg/dL — ABNORMAL HIGH (ref 8–23)
BUN: 36 mg/dL — ABNORMAL HIGH (ref 8–23)
BUN: 31 mg/dL — ABNORMAL HIGH (ref 8–23)
BUN: 32 mg/dL — ABNORMAL HIGH (ref 8–23)
BUN: 28 mg/dL — ABNORMAL HIGH (ref 8–23)
BUN: 34 mg/dL — ABNORMAL HIGH (ref 8–23)
BUN: 26 mg/dL — ABNORMAL HIGH (ref 8–23)
BUN: 42 mg/dL — ABNORMAL HIGH (ref 8–23)
BUN: 28 mg/dL — ABNORMAL HIGH (ref 8–23)
BUN: 30 mg/dL — ABNORMAL HIGH (ref 8–23)
BUN: 26 mg/dL — ABNORMAL HIGH (ref 8–23)
BUN: 30 mg/dL — ABNORMAL HIGH (ref 8–23)
Calcium, Ion: 1.09 mmol/L — ABNORMAL LOW (ref 1.15–1.40)
Calcium, Ion: 1.09 mmol/L — ABNORMAL LOW (ref 1.15–1.40)
Calcium, Ion: 0.43 mmol/L — CL (ref 1.15–1.40)
Calcium, Ion: 1.01 mmol/L — ABNORMAL LOW (ref 1.15–1.40)
Calcium, Ion: 0.31 mmol/L — CL (ref 1.15–1.40)
Calcium, Ion: 1.01 mmol/L — ABNORMAL LOW (ref 1.15–1.40)
Calcium, Ion: 0.95 mmol/L — ABNORMAL LOW (ref 1.15–1.40)
Calcium, Ion: <0.3 mmol/L — CL (ref 1.15–1.40)
Calcium, Ion: 0.48 mmol/L — CL (ref 1.15–1.40)
Calcium, Ion: 0.96 mmol/L — ABNORMAL LOW (ref 1.15–1.40)
Calcium, Ion: 0.31 mmol/L — CL (ref 1.15–1.40)
Calcium, Ion: 0.97 mmol/L — ABNORMAL LOW (ref 1.15–1.40)
Chloride: 85 mmol/L — ABNORMAL LOW (ref 98–111)
Chloride: 87 mmol/L — ABNORMAL LOW (ref 98–111)
Chloride: 82 mmol/L — ABNORMAL LOW (ref 98–111)
Chloride: 84 mmol/L — ABNORMAL LOW (ref 98–111)
Chloride: 84 mmol/L — ABNORMAL LOW (ref 98–111)
Chloride: 84 mmol/L — ABNORMAL LOW (ref 98–111)
Chloride: 84 mmol/L — ABNORMAL LOW (ref 98–111)
Chloride: 85 mmol/L — ABNORMAL LOW (ref 98–111)
Chloride: 82 mmol/L — ABNORMAL LOW (ref 98–111)
Chloride: 85 mmol/L — ABNORMAL LOW (ref 98–111)
Chloride: 83 mmol/L — ABNORMAL LOW (ref 98–111)
Chloride: 85 mmol/L — ABNORMAL LOW (ref 98–111)
Creatinine, Ser: 1.7 mg/dL — ABNORMAL HIGH (ref 0.61–1.24)
Creatinine, Ser: 2.1 mg/dL — ABNORMAL HIGH (ref 0.61–1.24)
Creatinine, Ser: 2 mg/dL — ABNORMAL HIGH (ref 0.61–1.24)
Creatinine, Ser: 2 mg/dL — ABNORMAL HIGH (ref 0.61–1.24)
Creatinine, Ser: 1.6 mg/dL — ABNORMAL HIGH (ref 0.61–1.24)
Creatinine, Ser: 2 mg/dL — ABNORMAL HIGH (ref 0.61–1.24)
Creatinine, Ser: 1.6 mg/dL — ABNORMAL HIGH (ref 0.61–1.24)
Creatinine, Ser: 1.9 mg/dL — ABNORMAL HIGH (ref 0.61–1.24)
Creatinine, Ser: 1.8 mg/dL — ABNORMAL HIGH (ref 0.61–1.24)
Creatinine, Ser: 1.9 mg/dL — ABNORMAL HIGH (ref 0.61–1.24)
Creatinine, Ser: 1.6 mg/dL — ABNORMAL HIGH (ref 0.61–1.24)
Creatinine, Ser: 2 mg/dL — ABNORMAL HIGH (ref 0.61–1.24)
Glucose, Bld: 169 mg/dL — ABNORMAL HIGH (ref 70–99)
Glucose, Bld: 192 mg/dL — ABNORMAL HIGH (ref 70–99)
Glucose, Bld: 225 mg/dL — ABNORMAL HIGH (ref 70–99)
Glucose, Bld: 255 mg/dL — ABNORMAL HIGH (ref 70–99)
Glucose, Bld: 282 mg/dL — ABNORMAL HIGH (ref 70–99)
Glucose, Bld: 302 mg/dL — ABNORMAL HIGH (ref 70–99)
Glucose, Bld: 159 mg/dL — ABNORMAL HIGH (ref 70–99)
Glucose, Bld: 217 mg/dL — ABNORMAL HIGH (ref 70–99)
Glucose, Bld: 168 mg/dL — ABNORMAL HIGH (ref 70–99)
Glucose, Bld: 174 mg/dL — ABNORMAL HIGH (ref 70–99)
Glucose, Bld: 143 mg/dL — ABNORMAL HIGH (ref 70–99)
Glucose, Bld: 194 mg/dL — ABNORMAL HIGH (ref 70–99)
HCT: 24 % — ABNORMAL LOW (ref 39.0–52.0)
HCT: 28 % — ABNORMAL LOW (ref 39.0–52.0)
HCT: 24 % — ABNORMAL LOW (ref 39.0–52.0)
HCT: 27 % — ABNORMAL LOW (ref 39.0–52.0)
HCT: 23 % — ABNORMAL LOW (ref 39.0–52.0)
HCT: 26 % — ABNORMAL LOW (ref 39.0–52.0)
HCT: 27 % — ABNORMAL LOW (ref 39.0–52.0)
HCT: 27 % — ABNORMAL LOW (ref 39.0–52.0)
HCT: 24 % — ABNORMAL LOW (ref 39.0–52.0)
HCT: 25 % — ABNORMAL LOW (ref 39.0–52.0)
HCT: 23 % — ABNORMAL LOW (ref 39.0–52.0)
HCT: 26 % — ABNORMAL LOW (ref 39.0–52.0)
Hemoglobin: 8.2 g/dL — ABNORMAL LOW (ref 13.0–17.0)
Hemoglobin: 9.5 g/dL — ABNORMAL LOW (ref 13.0–17.0)
Hemoglobin: 8.2 g/dL — ABNORMAL LOW (ref 13.0–17.0)
Hemoglobin: 9.2 g/dL — ABNORMAL LOW (ref 13.0–17.0)
Hemoglobin: 7.8 g/dL — ABNORMAL LOW (ref 13.0–17.0)
Hemoglobin: 8.8 g/dL — ABNORMAL LOW (ref 13.0–17.0)
Hemoglobin: 9.2 g/dL — ABNORMAL LOW (ref 13.0–17.0)
Hemoglobin: 9.2 g/dL — ABNORMAL LOW (ref 13.0–17.0)
Hemoglobin: 8.2 g/dL — ABNORMAL LOW (ref 13.0–17.0)
Hemoglobin: 8.5 g/dL — ABNORMAL LOW (ref 13.0–17.0)
Hemoglobin: 7.8 g/dL — ABNORMAL LOW (ref 13.0–17.0)
Hemoglobin: 8.8 g/dL — ABNORMAL LOW (ref 13.0–17.0)
Potassium: 4.1 mmol/L (ref 3.5–5.1)
Potassium: 4.3 mmol/L (ref 3.5–5.1)
Potassium: 4.2 mmol/L (ref 3.5–5.1)
Potassium: 4.2 mmol/L (ref 3.5–5.1)
Potassium: 4.1 mmol/L (ref 3.5–5.1)
Potassium: 4.2 mmol/L (ref 3.5–5.1)
Potassium: 3.9 mmol/L (ref 3.5–5.1)
Potassium: 4.6 mmol/L (ref 3.5–5.1)
Potassium: 4.1 mmol/L (ref 3.5–5.1)
Potassium: 4.2 mmol/L (ref 3.5–5.1)
Potassium: 3.9 mmol/L (ref 3.5–5.1)
Potassium: 4.2 mmol/L (ref 3.5–5.1)
Sodium: 137 mmol/L (ref 135–145)
Sodium: 138 mmol/L (ref 135–145)
Sodium: 137 mmol/L (ref 135–145)
Sodium: 138 mmol/L (ref 135–145)
Sodium: 137 mmol/L (ref 135–145)
Sodium: 138 mmol/L (ref 135–145)
Sodium: 138 mmol/L (ref 135–145)
Sodium: 140 mmol/L (ref 135–145)
Sodium: 138 mmol/L (ref 135–145)
Sodium: 140 mmol/L (ref 135–145)
Sodium: 137 mmol/L (ref 135–145)
Sodium: 139 mmol/L (ref 135–145)
TCO2: 37 mmol/L — ABNORMAL HIGH (ref 22–32)
TCO2: 41 mmol/L — ABNORMAL HIGH (ref 22–32)
TCO2: 39 mmol/L — ABNORMAL HIGH (ref 22–32)
TCO2: 40 mmol/L — ABNORMAL HIGH (ref 22–32)
TCO2: 36 mmol/L — ABNORMAL HIGH (ref 22–32)
TCO2: 40 mmol/L — ABNORMAL HIGH (ref 22–32)
TCO2: 38 mmol/L — ABNORMAL HIGH (ref 22–32)
TCO2: 44 mmol/L — ABNORMAL HIGH (ref 22–32)
TCO2: 41 mmol/L — ABNORMAL HIGH (ref 22–32)
TCO2: 43 mmol/L — ABNORMAL HIGH (ref 22–32)
TCO2: 38 mmol/L — ABNORMAL HIGH (ref 22–32)
TCO2: 41 mmol/L — ABNORMAL HIGH (ref 22–32)

## 2020-08-09 LAB — MAGNESIUM: Magnesium: 2 mg/dL (ref 1.7–2.4)

## 2020-08-09 LAB — CBC
HCT: 24.9 % — ABNORMAL LOW (ref 39.0–52.0)
Hemoglobin: 7.5 g/dL — ABNORMAL LOW (ref 13.0–17.0)
MCH: 28.6 pg (ref 26.0–34.0)
MCHC: 30.1 g/dL (ref 30.0–36.0)
MCV: 95 fL (ref 80.0–100.0)
Platelets: 277 10*3/uL (ref 150–400)
RBC: 2.62 MIL/uL — ABNORMAL LOW (ref 4.22–5.81)
RDW: 19.4 % — ABNORMAL HIGH (ref 11.5–15.5)
WBC: 17 10*3/uL — ABNORMAL HIGH (ref 4.0–10.5)
nRBC: 0.1 % (ref 0.0–0.2)

## 2020-08-09 LAB — HEPARIN LEVEL (UNFRACTIONATED)
Heparin Unfractionated: 0.17 IU/mL — ABNORMAL LOW (ref 0.30–0.70)
Heparin Unfractionated: <0.1 IU/mL — ABNORMAL LOW (ref 0.30–0.70)

## 2020-08-09 LAB — LACTATE DEHYDROGENASE: LDH: 273 U/L — ABNORMAL HIGH (ref 98–192)

## 2020-08-09 LAB — CALCIUM, IONIZED: Calcium, Ionized, Serum: 4.4 mg/dL — ABNORMAL LOW (ref 4.5–5.6)

## 2020-08-09 LAB — GLUCOSE, CAPILLARY
Glucose-Capillary: 175 mg/dL — ABNORMAL HIGH (ref 70–99)
Glucose-Capillary: 181 mg/dL — ABNORMAL HIGH (ref 70–99)
Glucose-Capillary: 186 mg/dL — ABNORMAL HIGH (ref 70–99)
Glucose-Capillary: 197 mg/dL — ABNORMAL HIGH (ref 70–99)
Glucose-Capillary: 214 mg/dL — ABNORMAL HIGH (ref 70–99)
Glucose-Capillary: 214 mg/dL — ABNORMAL HIGH (ref 70–99)
Glucose-Capillary: 259 mg/dL — ABNORMAL HIGH (ref 70–99)

## 2020-08-09 LAB — COOXEMETRY PANEL
Carboxyhemoglobin: 1.4 % (ref 0.5–1.5)
Methemoglobin: 1.1 % (ref 0.0–1.5)
O2 Saturation: 46.8 %
Total hemoglobin: 7.8 g/dL — ABNORMAL LOW (ref 12.0–16.0)

## 2020-08-09 MED ORDER — DOBUTAMINE IN D5W 4-5 MG/ML-% IV SOLN
2.5000 ug/kg/min | INTRAVENOUS | Status: DC
  Administered 2020-08-09 – 2020-08-16 (×4): 2.5 ug/kg/min via INTRAVENOUS
  Filled 2020-08-09 (×3): qty 250

## 2020-08-09 MED ORDER — CHLORHEXIDINE GLUCONATE 0.12 % MT SOLN
OROMUCOSAL | Status: AC
  Filled 2020-08-09: qty 15

## 2020-08-09 MED ORDER — SENNOSIDES 8.8 MG/5ML PO SYRP
10.0000 mL | ORAL_SOLUTION | Freq: Every day | ORAL | Status: DC
  Administered 2020-08-09: 10 mL
  Filled 2020-08-09: qty 10

## 2020-08-09 MED ORDER — NUTRISOURCE FIBER PO PACK
1.0000 | PACK | Freq: Two times a day (BID) | ORAL | Status: DC
  Administered 2020-08-09 – 2020-08-22 (×26): 1
  Filled 2020-08-09 (×29): qty 1

## 2020-08-09 NOTE — Progress Notes (Signed)
      [address]       [place] [address]             [phone]      Sedation off, no change in mental status  BP (!) 134/98   Pulse 92   Temp (!) 100.6 F (38.1 C) (Oral)   Resp (!) 21   Ht '6\' 1"'$  (1.854 m)   Wt 108 kg   SpO2 100%   BMI 31.41 kg/m  Impella p4 with 2.3 L/min flow On CVVHD CVP 9  K= 4.2, HCt 23  Continue current Rx  Novelle Addair C. Roxan Hockey, MD [facility] [phone]

## 2020-08-09 NOTE — Progress Notes (Signed)
NAMEKollen Carpenter, MRN:  [id number], DOB:  10-Jun-1950, LOS: 52 ADMISSION DATE:  07/14/2020, CONSULTATION DATE: 07/28/2018 REFERRING MD: Dr. Orvan Seen, CHIEF COMPLAINT: Ventilator dependent  History of Present Illness:  Danny Carpenter is a 70 year old male with past medical history significant for hypertension, hyperlipidemia, type 2 diabetes, CVA, AAA, left foot amputation s/p fem pop bypass and heart failure who presented to the emergency department 4/21 after undergoing outpatient stress test that revealed EF of 20% and acute ischemia.  Since admission patient has been evaluated by cardiology and cardiothoracic surgery and patient underwent TEE and left heart cath.  LHC/RHC revealed severe three-vessel coronary artery disease with moderately elevated left heart and pulmonary artery pressures.  At that time patient was placed on goal-directed medical therapy for acute systolic congestive heart failure and CTS surgery was consulted for CABG potential.  Patient underwent CABG x5 with Dr. Orvan Seen 4/26, post CABG CODE BLUE called due to low flow on Impella, CTS notified.  Patient underwent bedside exploratory postoperative open heart where Dr. Julien Girt reopened the chest incision and hematoma was removed, patient's hemodynamics improved upon opening sternotomy.  PCCM consulted morning of 4/28 due to difficulty weaning ventilator  Pertinent  Medical History  Hypertension, hyperlipidemia, type 2 diabetes, CVA, AAA, left foot amputation s/p fem pop bypass and heart failure   Significant Hospital Events: Including procedures, antibiotic start and stop dates in addition to other pertinent events   . 4/21 presented to the ED for evaluation of abnormal stress test . 4/22 left and right heart cath severe multivessel disease . 4/26 CABG x5 with hemodynamic instability post requiring brief CPR with eventual return back to the OR anastomosis bleeding was seen . 4/28 remains ventilator dependent, slowly weaning  PEEP . 5/1 remains on vent, diuresing, no ready to come off  . 5/3 started on CRRT. . 5/5 requiring sedation for vent  . 5/6 CT scan shows multiple areas of posterior circulation infarction. . 5/8 tolerating CRRT with fluid removal.  Decreasing vasopressor requirements.  Interim History / Subjective:   Tracheostomy performed yesterday. Weaning off all sedation today.   Objective   Blood pressure 121/65, pulse 76, temperature 99.2 F (37.3 C), temperature source Oral, resp. rate 11, height '6\' 1"'$  (1.854 m), weight 108 kg, SpO2 100 %. CVP:  [8 mmHg-12 mmHg] 8 mmHg  Vent Mode: PSV;CPAP FiO2 (%):  [40 %-100 %] 40 % Set Rate:  [20 bmp] 20 bmp Vt Set:  [630 mL] 630 mL PEEP:  [5 cmH20] 5 cmH20 Pressure Support:  [12 cmH20] 12 cmH20 Plateau Pressure:  [14 cmH20-26 cmH20] 15 cmH20   Intake/Output Summary (Last 24 hours) at 08/09/2020 1258 Last data filed at 08/09/2020 1200 Gross per 24 hour  Intake 4284.9 ml  Output 6921 ml  Net -2636.1 ml   Filed Weights   08/07/20 0615 08/15/2020 0500 08/09/20 0500  Weight: 117.7 kg 114 kg 108 kg    Examination: General: Critically ill appearing older adult M intubated sedated NAD HEENT: ETT secure slightly icteric sclera Neuro: Sedated.  Will move head from side to side when stimulated. Spontaneously lifts left arm  CV: Impella p4. Cap refill < 3 sec. irreg rhythm. HS normal, chest tubes out and sternum well healed.  PULM: Mechanically ventilated. Symmetrical chest expansion. Diminished basilar sounds. Synchronous with ventilator. GI: Soft round, no tenderness.   GU: scrotal edema Extremities: Anasarca almost completely resolved. No acute joint deformity. No clubbing or cyanosis. L metatarsal amputation Skin: Line sites are intact  Labs/imaging  that I have personally reviewed    CT head personally reviewed and shows multiple areas of brainstem and cerebellar infarction-unchanged from prior scan.  Leukocytosis improving at 17, HB 7.8 PLT normal  212, ScvO2 48%  Echo  shows improved LVSF to EF 40%   Resolved Hospital Problem list     Assessment & Plan:   Critically ill due to acute hypoxic respiratory failure  R pleural effusion, mild pulmonary edema  Critically ill due to cardiogenic Shock requiring mechanical cardiac support Persistent encephalopathy-multifactorial including prolonged ICU stay and sedative infusions Evidence of acute posterior circulation CVA CAD sp CABG x5 AKI Colonic ileus Anemia, post op  Thrombocytopenia - stable  Low grade pyrexia and leukocus of unclear etiology PAD s/p partial L foot amputation  AAA Poorly controlled DM2 with microvascular complications.   Plan:  -Wean all sedation off now that tracheostomy in place. Observe for best neurological recovery. Appreciate Neurology input. - Run CRRT even as clinically euvolemic - Added dobutamine for inotropic support, continue to wean Impella - Transition to oral amiodarone soon.    Best practice   Diet:  Tube Feed   Pain/Anxiety/Delirium protocol (if indicated): Yes (RASS goal -1) - fentanyl prn, wean Precedex as tolerated VAP protocol (if indicated): Yes DVT prophylaxis: Systemic AC - Heparin in Impella purge. GI prophylaxis: PPI  Fiber added for diarrhea Glucose control:  SSI Yes - SSI + basal + q4  Central venous access:  Yes, and it is still needed, Arterial line:  Yes, and it is still needed  Foley:  N/A - removed now that on CRRT Mobility:  bed rest  PT consulted: N/A Last date of multidisciplinary goals of care discussion: I spoke with the patient's sons today 5/6 and informed them that he remains critically ill from a cardiovascular standpoint and that recovery is not guaranteed.  They also understand that presence of cerebral infarction would likely indicate a poor overall outcome and that this a transition to comfort care may be appropriate at that time.  Code Status:  full code Disposition: ICU  Labs   CBC: Recent Labs  Lab  08/04/20 0800 08/04/20 0850 08/05/20 0425 08/05/20 1401 08/06/20 0347 08/06/20 0348 08/07/20 0408 08/07/20 [id number] 08/07/2020 0747 08/14/2020 1304 08/09/20 0345 08/09/20 0413 08/09/20 0419 08/09/20 0618 08/09/20 0626  WBC 18.1*  --  23.7*  --  21.2*  --  21.2*  --  18.1*  --  17.0*  --   --   --   --   NEUTROABS 13.2*  --   --   --   --   --   --   --   --   --   --   --   --   --   --   HGB 9.2*   < > 8.6*   < > 7.9*   < > 7.8*   < > 7.5*   < > 7.5* 8.2* 9.2* 8.8* 7.8*  HCT 29.8*   < > 28.3*   < > 25.8*   < > 25.1*   < > 25.0*   < > 24.9* 24.0* 27.0* 26.0* 23.0*  MCV 92.8  --  93.4  --  94.2  --  93.7  --  93.6  --  95.0  --   --   --   --   PLT 158  --  154  --  175  --  212  --  236  --  277  --   --   --   --    < > =  values in this interval not displayed.    Basic Metabolic Panel: Recent Labs  Lab 08/05/20 0425 08/05/20 1652 08/06/20 0347 08/06/20 0348 08/07/20 0413 08/07/20 [id number] 08/07/20 1623 08/07/20 1627 08/28/2020 0433 08/04/2020 1304 08/09/2020 1600 08/17/2020 1710 08/09/20 0345 08/09/20 0413 08/09/20 0419 08/09/20 0618 08/09/20 0626  NA 133*  133*   < > 136  135   < > 137   < > 138   < > 138   < > 140   < > 140 137 138 138 137  K 5.5*  5.5*   < > 4.9  4.8   < > 4.3   < > 4.3   < > 4.1   < > 3.8   < > 4.3 4.2 4.2 4.1 4.2  CL 102  102   < > 97*  96*   < > 90*   < > 89*   < > 85*   < > 85*   < > 86* 84* 82* 84* 84*  CO2 24  23   < > 29  28   < > 35*  --  38*  --  40*  --  41*  --  40*  --   --   --   --   GLUCOSE 193*  192*   < > 134*  133*   < > 157*   < > 181*   < > 188*   < > 191*   < > 220* 225* 255* 302* 282*  BUN 44*  43*   < > 46*  45*   < > 40*   < > 35*   < > 33*   < > 33*   < > 30* 31* 32* 28* 34*  CREATININE 2.21*  2.17*   < > 2.28*  2.28*   < > 2.19*   < > 2.13*   < > 2.54*   < > 2.29*   < > 2.11* 2.00* 2.00* 1.60* 2.00*  CALCIUM 7.5*  7.6*   < > 8.5*  8.6*   < > 9.1  --  9.3  --  9.1  --  8.9  --  9.1  --   --   --   --   MG 2.6*  --  2.4  --   2.2  --   --   --  2.3  --   --   --  2.0  --   --   --   --   PHOS 3.6   < > 4.1   < > 3.5  --  3.6  --  2.9  --  3.7  --  3.6  --   --   --   --    < > = values in this interval not displayed.   GFR: Estimated Creatinine Clearance: 45.6 mL/min (A) (by C-G formula based on SCr of 2 mg/dL (H)). Recent Labs  Lab 08/06/20 0347 08/07/20 0408 08/15/2020 0747 08/09/20 0345  PROCALCITON  --   --  1.19  --   WBC 21.2* 21.2* 18.1* 17.0*    Liver Function Tests: Recent Labs  Lab 08/07/20 0413 08/07/20 1623 08/19/2020 0433 08/16/2020 1600 08/09/20 0345  ALBUMIN 2.1* 2.1* 2.0* 2.0* 2.1*   No results for input(s): LIPASE, AMYLASE in the last 168 hours. No results for input(s): AMMONIA in the last 168 hours.  ABG    Component Value Date/Time   PHART 7.430 08/07/2020 0633   PCO2ART 58.4 (H)  08/07/2020 0633   PO2ART 101 08/07/2020 0633   HCO3 38.6 (H) 08/07/2020 0633   TCO2 40 (H) 08/09/2020 0626   ACIDBASEDEF 2.0 08/05/2020 0400   O2SAT 46.8 08/09/2020 0430     Coagulation Profile: No results for input(s): INR, PROTIME in the last 168 hours.  Cardiac Enzymes: No results for input(s): CKTOTAL, CKMB, CKMBINDEX, TROPONINI in the last 168 hours.  HbA1C: Hgb A1c MFr Bld  Date/Time Value Ref Range Status  07/27/2020 06:28 PM 11.6 (H) 4.8 - 5.6 % Final    Comment:    (NOTE) Pre diabetes:          5.7%-6.4%  Diabetes:              >6.4%  Glycemic control for   <7.0% adults with diabetes   08/05/2019 03:09 AM 11.6 (H) 4.8 - 5.6 % Final    Comment:    (NOTE) Pre diabetes:          5.7%-6.4% Diabetes:              >6.4% Glycemic control for   <7.0% adults with diabetes     CBG: Recent Labs  Lab 08/16/2020 1956 08/09/20 0007 08/09/20 0418 08/09/20 0847 08/09/20 1130  GLUCAP 198* 197* 259* 214* 186*    CRITICAL CARE Performed by: Kipp Brood   Total critical care time: 40 minutes  Critical care time was exclusive of separately billable procedures and treating  other patients. Critical care was necessary to treat or prevent imminent or life-threatening deterioration.  Critical care was time spent personally by me on the following activities: development of treatment plan with patient and/or surrogate as well as nursing, discussions with consultants, evaluation of patient's response to treatment, examination of patient, obtaining history from patient or surrogate, ordering and performing treatments and interventions, ordering and review of laboratory studies, ordering and review of radiographic studies, pulse oximetry and re-evaluation of patient's condition.  Kipp Brood, MD [facility] ICU Physician [place]  Pager: [phone] Or Epic Secure Chat After hours: [phone].  08/09/2020, 12:58 PM      08/09/2020, 12:58 PM
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
## 2020-08-09 NOTE — Progress Notes (Signed)
[place] for Heparin Indication: Impella 5.5  No Known Allergies  Patient Measurements: Height: '6\' 1"'$  (185.4 cm) Weight: 108 kg (238 lb 1.6 oz) IBW/kg (Calculated) : 79.9 Heparin Dosing Weight: 102.8 kg  Vital Signs: Temp: 100.6 F (38.1 C) (05/11 1635) Temp Source: Oral (05/11 1635) BP: 134/98 (05/11 1900) Pulse Rate: 92 (05/11 1900)  Labs: Recent Labs    08/07/20 0408 08/07/20 0413 08/12/2020 0443 08/07/2020 0747 08/14/2020 1304 08/09/20 0345 08/09/20 0413 08/09/20 0500 08/09/20 0618 08/09/20 1652 08/09/20 1700 08/09/20 1805 08/09/20 1812  HGB 7.8*   < >  --  7.5*   < > 7.5*   < >  --    < > 8.2*  --  8.8* 7.8*  HCT 25.1*   < >  --  25.0*   < > 24.9*   < >  --    < > 24.0*  --  26.0* 23.0*  PLT 212  --   --  236  --  277  --   --   --   --   --   --   --   HEPARINUNFRC 0.25*   < > 0.13*  --   --   --   --  0.17*  --   --  <0.10*  --   --   CREATININE  --    < >  --   --    < > 2.11*   < >  --    < > 1.90*  --  1.60* 2.00*   < > = values in this interval not displayed.    Estimated Creatinine Clearance: 45.6 mL/min (A) (by C-G formula based on SCr of 2 mg/dL (H)).   Assessment: 70 yo M presents with NSTEMI and multivessel CAD, now s/p CABG with Impella support. Pharmacy asked to manage systemic heparin.  Rectal bleeding has been ongoing but is stable, bloody sputum has resolved. Heparin held for trach, ok to restart in 4 hours per CCM.  Heparin level this afternoon is undetectable, no new bleeding or infusion issues per nursing.  Goal of Therapy:  Heparin level 0.2-0.3 units/ml Monitor platelets by anticoagulation protocol: Yes   Plan:  Continue heparin purge solution (50 units/ml) Increase heparin to 1650 units/h Continue q12h coags   Arrie Senate, PharmD, [place], [facility] Clinical Pharmacist [phone] Please check AMION for all [facility] Pharmacy numbers 08/09/2020

## 2020-08-09 NOTE — Progress Notes (Signed)
SLP Cancellation Note  Patient Details Name: Danny Carpenter MRN: [id number] DOB: 08/20/1950   Cancelled treatment:       Reason Eval/Treat Not Completed: Patient not medically ready. Patient with new tracheostomy on previous date. Orders for SLP eval and treat for PMSV and swallowing received. Per RN, pt is not following commands but team is working on weaning sedation and weaning from the vent (currently on wean mode). Will follow pt closely for readiness for SLP interventions as appropriate - particularly as mentation begins to improve and/or he can attempt TC.     Osie Bond., M.A. [place] Acute Rehabilitation Services Pager [phone] Office [phone]  08/09/2020, 9:19 AM

## 2020-08-09 NOTE — Progress Notes (Addendum)
TCTS DAILY ICU PROGRESS NOTE                   [address]            [place] [address]          [phone]   15 Days Post-Op Procedure(s) (LRB): EXPLORATION POST OPERATIVE OPEN HEART (N/A)  Total Length of Stay:  LOS: 20 days   Subjective: Sedated on vent  Objective: Vital signs in last 24 hours: Temp:  [98.4 F (36.9 C)-102.4 F (39.1 C)] 99.3 F (37.4 C) (05/11 0800) Pulse Rate:  [59-88] 78 (05/11 0800) Cardiac Rhythm: Atrial fibrillation (05/11 0400) Resp:  [14-31] 25 (05/11 0800) BP: (88-165)/(32-105) 146/99 (05/11 0800) SpO2:  [93 %-100 %] 100 % (05/11 0800) Arterial Line BP: (93-173)/(35-63) 173/63 (05/11 0800) FiO2 (%):  [40 %-100 %] 40 % (05/11 0756) Weight:  [097 kg] 108 kg (05/11 0500)  Filed Weights   08/07/20 0615 08/07/2020 0500 08/09/20 0500  Weight: 117.7 kg 114 kg 108 kg    Weight change: -6 kg   Hemodynamic parameters for last 24 hours: CVP:  [12 mmHg] 12 mmHg  Intake/Output from previous day: 05/10 0701 - 05/11 0700 In: 4386.5 [I.V.:2817.4; NG/GT:639.8; IV Piggyback:679.8] Out: 3532 [Stool:150]  Intake/Output this shift: Total I/O In: 164.8 [I.V.:109.5; Other:10.3; NG/GT:45] Out: 276 [Other:276]  Current Meds: Scheduled Meds: . artificial tears  1 application Both Eyes D9M  . aspirin EC  325 mg Oral Daily   Or  . aspirin  324 mg Per Tube Daily  . B-complex with vitamin C  1 tablet Per Tube Daily  . bisacodyl  10 mg Oral Daily   Or  . bisacodyl  10 mg Rectal Daily  . chlorhexidine gluconate (MEDLINE KIT)  15 mL Mouth Rinse BID  . Chlorhexidine Gluconate Cloth  6 each Topical Daily  . clonazepam  0.25 mg Per Tube BID  . darbepoetin (ARANESP) injection - NON-DIALYSIS  150 mcg Subcutaneous Q Wed-1800  . docusate  200 mg Per Tube Daily  . feeding supplement (PROSource TF)  45 mL Per Tube 5 X Daily  . fiber  1 packet Per Tube BID  . HYDROmorphone HCl  2 mg Per Tube Q6H  . insulin aspart  10 Units Subcutaneous Q4H  .  insulin aspart  3-9 Units Subcutaneous Q4H  . insulin detemir  29 Units Subcutaneous Q12H  . levalbuterol  0.63 mg Nebulization Q6H  . mouth rinse  15 mL Mouth Rinse 10 times per day  . metoCLOPramide (REGLAN) injection  5 mg Intravenous Q8H  . midodrine  5 mg Per Tube TID WC  . pantoprazole sodium  40 mg Per Tube Daily  . polyethylene glycol  17 g Per Tube Daily  . QUEtiapine  25 mg Per Tube BID  . rosuvastatin  40 mg Per Tube Daily  . sodium chloride flush  10-40 mL Intracatheter Q12H   Continuous Infusions: .  prismasol BGK 4/2.5 500 mL/hr at 08/05/20 0800  .  prismasol BGK 4/2.5 300 mL/hr at 08/07/2020 1741  . sodium chloride Stopped (08/01/20 1202)  . sodium chloride    . amiodarone 30 mg/hr (08/09/20 0800)  . calcium gluconate infusion for CRRT 20 g (08/09/20 0332)  . citrate dextrose 430 mL/hr at 08/09/20 0425  . dexmedetomidine (PRECEDEX) IV infusion 0.1 mcg/kg/hr (08/09/20 0800)  . dextrose    . DOBUTamine    . feeding supplement (VITAL 1.5 CAL) 45 mL/hr at 08/09/20 0500  .  fentaNYL infusion INTRAVENOUS 25 mcg/hr (08/09/20 0800)  . heparin 1,550 Units/hr (08/09/20 0800)  . impella catheter heparin 50 unit/mL in dextrose 5% 11.9 mL/hr at 07/31/20 0148  . meropenem (MERREM) IV Stopped (08/09/20 0557)  . norepinephrine (LEVOPHED) Adult infusion 11 mcg/min (08/09/20 0800)  . prismasol BGK 4/2.5 1,500 mL/hr at 08/09/20 0731   PRN Meds:.sodium chloride, Place/Maintain arterial line **AND** sodium chloride, acetaminophen (TYLENOL) oral liquid 160 mg/5 mL, dextrose, fentaNYL, fentaNYL (SUBLIMAZE) injection, heparin, metoprolol tartrate, ondansetron (ZOFRAN) IV, sodium chloride, sodium chloride flush, traMADol  General appearance: uncertain , moves left - non purposeful, minimal movement on right Neurologic: obtunded, sedated Heart: irregularly irregular rhythm Lungs: coarse Abdomen: soft Extremities: + edema- improved Wound: no cellulitis or drainage  Lab  Results: CBC: Recent Labs    08/26/2020 0747 08/17/2020 1304 08/09/20 0345 08/09/20 0413 08/09/20 0618 08/09/20 0626  WBC 18.1*  --  17.0*  --   --   --   HGB 7.5*   < > 7.5*   < > 8.8* 7.8*  HCT 25.0*   < > 24.9*   < > 26.0* 23.0*  PLT 236  --  277  --   --   --    < > = values in this interval not displayed.   BMET:  Recent Labs    08/25/2020 1600 08/11/2020 1710 08/09/20 0345 08/09/20 0413 08/09/20 0618 08/09/20 0626  NA 140   < > 140   < > 138 137  K 3.8   < > 4.3   < > 4.1 4.2  CL 85*   < > 86*   < > 84* 84*  CO2 41*  --  40*  --   --   --   GLUCOSE 191*   < > 220*   < > 302* 282*  BUN 33*   < > 30*   < > 28* 34*  CREATININE 2.29*   < > 2.11*   < > 1.60* 2.00*  CALCIUM 8.9  --  9.1  --   --   --    < > = values in this interval not displayed.    CMET: Lab Results  Component Value Date   WBC 17.0 (H) 08/09/2020   HGB 7.8 (L) 08/09/2020   HCT 23.0 (L) 08/09/2020   PLT 277 08/09/2020   GLUCOSE 282 (H) 08/09/2020   CHOL 159 07/24/2020   TRIG 134 07/31/2020   HDL 35 (L) 07/03/2020   LDLCALC 86 07/14/2020   ALT 14 07/27/2020   AST 21 07/27/2020   NA 137 08/09/2020   K 4.2 08/09/2020   CL 84 (L) 08/09/2020   CREATININE 2.00 (H) 08/09/2020   BUN 34 (H) 08/09/2020   CO2 40 (H) 08/09/2020   INR 1.5 (H) 07/22/2020   HGBA1C 11.6 (H) 07/16/2020      PT/INR: No results for input(s): LABPROT, INR in the last 72 hours. Radiology: CT HEAD WO CONTRAST  Result Date: 08/10/2020 CLINICAL DATA:  Stroke, follow-up EXAM: CT HEAD WITHOUT CONTRAST TECHNIQUE: Contiguous axial images were obtained from the base of the skull through the vertex without intravenous contrast. Sagittal and coronal MPR images reconstructed from axial data set. COMPARISON:  08/04/2020 FINDINGS: Brain: Generalized atrophy. Normal ventricular morphology. No midline shift or mass effect. Again identified area of hypoattenuation in RIGHT occipital lobe consistent with small infarct. Small infarct LEFT  cerebellar hemisphere. Small infarcts RIGHT middle cerebellar peduncle and RIGHT caudate. LEFT pontine infarct seen on previous exam not well  demonstrated on current study. No intracranial hemorrhage, mass lesion, or additional infarct. No extra-axial fluid collection Vascular: Atherosclerotic calcifications of internal carotid and vertebral arteries at skull base. Skull: Motion artifacts degrade assessment of basilar osseous structures. No definite calvarial abnormalities. Sinuses/Orbits: Mucosal thickening in ethmoid air cells and frontal sinus. Air-fluid level sphenoid sinus. Other: N/A IMPRESSION: Multiple small infarcts again identified as above. No new intracranial abnormalities. Electronically Signed   By: Lavonia Dana M.D.   On: 07/31/2020 10:45   DG Chest Port 1 View  Result Date: 08/22/2020 CLINICAL DATA:  Tracheostomy. EXAM: PORTABLE CHEST 1 VIEW COMPARISON:  08/17/2020 FINDINGS: Interval placement tracheostomy tube with tip 4.5 centimeters above the carina. Feeding tube is in place, tip beyond the edge of the image. Unchanged appearance of Impella device. Unchanged appearance of LEFT subclavian central line. Stable cardiomegaly. Persistent significant opacity at both lung bases, likely increased on the LEFT. Patchy infiltrates within the lungs bilaterally. Remote LEFT rib fractures. IMPRESSION: Interval placement of tracheostomy tube. Electronically Signed   By: Nolon Nations M.D.   On: 08/13/2020 16:37     Assessment/Plan: S/P Procedure(s) (LRB): EXPLORATION POST OPERATIVE OPEN HEART (N/A)  1 Tmax 102.5- on merrem, + HTN, rate controlled afib on amio 2 remains fully ventillated, + trach, PCCM managing 3 CT stable yesterday - multiple small infarcts in post circulation , neurology consulting today to assist with next steps from CVA perspective. Weaning sedation , some movement- not purposeful4 AHF conts to manage pressors, impella, etc- Co-Ox only 46 today if accurate 5 nephrology  managing CVVH- creat 2.0 6 conts TF's 7 remains critical John Giovanni PA-C Pager [phone] 08/09/2020 8:31 AM   Pt seen and examined; agree with documentation. Appreciate multidisciplinary care. Kasai Beltran Z. Orvan Seen, [place]

## 2020-08-09 NOTE — Progress Notes (Signed)
PT Cancellation Note  Patient Details Name: Danny Carpenter MRN: [id number] DOB: [birth year]   Cancelled Treatment:    Reason Eval/Treat Not Completed: Medical issues which prohibited therapy.  Low responsiveness, not following direction and with femoral CRRT access.  RN asks to hold today.  Will see 5/12 as able and appropriate. 08/09/2020  Ginger Carne., PT Acute Rehabilitation Services [phone]  (pager) [phone]  (office)   Tessie Fass Dhiren Azimi 08/09/2020, 4:32 PM

## 2020-08-09 NOTE — Progress Notes (Signed)
EEG Completed; Results Pending  

## 2020-08-09 NOTE — Progress Notes (Signed)
[place] for Heparin Indication: Impella 5.5  No Known Allergies  Patient Measurements: Height: '6\' 1"'$  (185.4 cm) Weight: 108 kg (238 lb 1.6 oz) IBW/kg (Calculated) : 79.9 Heparin Dosing Weight: 102.8 kg  Vital Signs: Temp: 99.2 F (37.3 C) (05/11 1129) Temp Source: Oral (05/11 1129) BP: 120/57 (05/11 1300) Pulse Rate: 78 (05/11 1300)  Labs: Recent Labs    08/07/20 0408 08/07/20 0413 08/07/20 1623 08/07/20 1627 08/16/2020 0443 08/13/2020 0747 08/27/2020 1304 08/09/20 0345 08/09/20 0413 08/09/20 0419 08/09/20 0500 08/09/20 0618 08/09/20 0626  HGB 7.8*   < >  --    < >  --  7.5*   < > 7.5*   < > 9.2*  --  8.8* 7.8*  HCT 25.1*   < >  --    < >  --  25.0*   < > 24.9*   < > 27.0*  --  26.0* 23.0*  PLT 212  --   --   --   --  236  --  277  --   --   --   --   --   HEPARINUNFRC 0.25*  --  0.22*  --  0.13*  --   --   --   --   --  0.17*  --   --   CREATININE  --    < > 2.13*   < >  --   --    < > 2.11*   < > 2.00*  --  1.60* 2.00*   < > = values in this interval not displayed.    Estimated Creatinine Clearance: 45.6 mL/min (A) (by C-G formula based on SCr of 2 mg/dL (H)).   Assessment: 70 yo M presents with NSTEMI and multivessel CAD, now s/p CABG with Impella support. Pharmacy asked to manage systemic heparin.  Rectal bleeding has been ongoing but is stable, bloody sputum has resolved. Heparin held for trach, ok to restart in 4 hours per CCM.  Heparin level this AM just slightly below goal.  No overt bleeding or complications noted, but Hgb down to 7.8.  Goal of Therapy:  Heparin level 0.2-0.3 units/ml Monitor platelets by anticoagulation protocol: Yes   Plan:  Continue heparin purge solution (50 units/ml) Given low Hgb, will continue heparin at 1550 units/hr for now. Recheck heparin level at 5 PM.  [place] Crane, Vena [place], BCPS, BCCP Clinical Pharmacist  08/09/2020 1:22 PM   [facility] pharmacy phone numbers are listed on  [email or web address]

## 2020-08-09 NOTE — Progress Notes (Signed)
eLink Physician-Brief Progress Note Patient Name: Danny Carpenter DOB: 30-Aug-1950 MRN: [id number]   Date of Service  08/09/2020  HPI/Events of Note  Hyperglycemia.  eICU Interventions  Humalog 10 units SQ Q 4 hours ordered resumed as patient is close to goal enteric nutrition intake,        [name] U [name] 08/09/2020, 5:23 AM

## 2020-08-09 NOTE — TOC Progression Note (Signed)
Transition of Care (TOC) - Progression Note  Heart Failure   Patient Details  Name: Danny Carpenter MRN: [id number] Date of Birth: 1950/07/28  Transition of Care [facility]) CM/SW [place], [place] Phone Number: 08/09/2020, 9:39 AM  Clinical Narrative:   CSW attempted to visit the patient at bedside to introduce self as the heart failure social worker and to complete a very brief SDOH screening with the patient to address social needs as needed however the patient was unresponsive and unable to engage in conversation at this time.   CSW will continue to follow for discharge needs and will check back with the patient at another time.        Barriers to Discharge: Continued Medical Work up  Expected Discharge Plan and Services   In-house Referral: Clinical Social Work                                             Social Determinants of Health (SDOH) Interventions    Readmission Risk Interventions Readmission Risk Prevention Plan 01/29/2019 01/20/2019  Post Dischage Appt - Complete  Medication Screening - Complete  Transportation Screening Complete Complete  PCP or Specialist Appt within 5-7 Days Complete -  Home Care Screening Complete -  Medication Review (RN CM) Complete -  Some recent data might be hidden   Khalel Alms, MSW, LCSWA [phone] Heart Failure Social Worker

## 2020-08-09 NOTE — Progress Notes (Signed)
Admit: 07/22/2020 LOS: 84  76M progressive AKI after CABG with prolonged shock (cardiogenic and ? Vasodilatory/septic), failure to achieve target volume status despite aggressive diuretics.    Current CRRT Prescription: Start Date: 08/01/20 Catheter: L Fem Temp IJ CCM placed 5/3 BFR: 300 Pre Blood Pump: 500 4K DFR: 1500 4K Replacement Rate: 300 4K Goal UF: 151m net neg/h Anticoagulation: Citrate Clotting: None since transition to citrate  S: Stable on CRRT, Tolerating UF  -2.1 liters last 24 hours K 4.2, P 3.6 GOC meeting -  Now partial code but wanting to proceed with PEG and trach and continue to monitor for improvement-- s/p trach yesterday Had another head CT- multiple small infarcts as previous   O: 05/10 0701 - 05/11 0700 In: 4386.5 [I.V.:2817.4; NG/GT:639.8; IV Piggyback:679.8] Out: 6520 [Stool:150]  PE Eyes open but no commands, restless in bed Lungs-  CBS bilat- vent CV- RRR- fresh sternotomy Abd-  Distended Ext-  Pitting edema to dep areas   Filed Weights   08/07/20 0615 08/06/2020 0500 08/09/20 0500  Weight: 117.7 kg 114 kg 108 kg    Recent Labs  Lab 08/09/2020 0433 08/17/2020 1304 08/23/2020 1600 08/27/2020 1710 08/09/20 0345 08/09/20 0413 08/09/20 0419 08/09/20 0618 08/09/20 0626  NA 138   < > 140   < > 140   < > 138 138 137  K 4.1   < > 3.8   < > 4.3   < > 4.2 4.1 4.2  CL 85*   < > 85*   < > 86*   < > 82* 84* 84*  CO2 40*  --  41*  --  40*  --   --   --   --   GLUCOSE 188*   < > 191*   < > 220*   < > 255* 302* 282*  BUN 33*   < > 33*   < > 30*   < > 32* 28* 34*  CREATININE 2.54*   < > 2.29*   < > 2.11*   < > 2.00* 1.60* 2.00*  CALCIUM 9.1  --  8.9  --  9.1  --   --   --   --   PHOS 2.9  --  3.7  --  3.6  --   --   --   --    < > = values in this interval not displayed.   Recent Labs  Lab 08/04/20 0800 08/04/20 0850 08/07/20 0408 08/07/20 0568105/05/2020 0747 07/30/2020 1304 08/09/20 0345 08/09/20 0413 08/09/20 0419 08/09/20 0618 08/09/20 0626   WBC 18.1*   < > 21.2*  --  18.1*  --  17.0*  --   --   --   --   NEUTROABS 13.2*  --   --   --   --   --   --   --   --   --   --   HGB 9.2*   < > 7.8*   < > 7.5*   < > 7.5*   < > 9.2* 8.8* 7.8*  HCT 29.8*   < > 25.1*   < > 25.0*   < > 24.9*   < > 27.0* 26.0* 23.0*  MCV 92.8   < > 93.7  --  93.6  --  95.0  --   --   --   --   PLT 158   < > 212  --  236  --  277  --   --   --   --    < > =  values in this interval not displayed.    Scheduled Meds: . artificial tears  1 application Both Eyes W7P  . aspirin EC  325 mg Oral Daily   Or  . aspirin  324 mg Per Tube Daily  . B-complex with vitamin C  1 tablet Per Tube Daily  . bisacodyl  10 mg Oral Daily   Or  . bisacodyl  10 mg Rectal Daily  . chlorhexidine gluconate (MEDLINE KIT)  15 mL Mouth Rinse BID  . Chlorhexidine Gluconate Cloth  6 each Topical Daily  . clonazepam  0.25 mg Per Tube BID  . darbepoetin (ARANESP) injection - NON-DIALYSIS  150 mcg Subcutaneous Q Wed-1800  . docusate  200 mg Per Tube Daily  . feeding supplement (PROSource TF)  45 mL Per Tube 5 X Daily  . HYDROmorphone HCl  2 mg Per Tube Q6H  . insulin aspart  10 Units Subcutaneous Q4H  . insulin aspart  3-9 Units Subcutaneous Q4H  . insulin detemir  29 Units Subcutaneous Q12H  . levalbuterol  0.63 mg Nebulization Q6H  . mouth rinse  15 mL Mouth Rinse 10 times per day  . metoCLOPramide (REGLAN) injection  5 mg Intravenous Q8H  . midodrine  5 mg Per Tube TID WC  . pantoprazole sodium  40 mg Per Tube Daily  . polyethylene glycol  17 g Per Tube Daily  . QUEtiapine  25 mg Per Tube BID  . rosuvastatin  40 mg Per Tube Daily  . sodium chloride flush  10-40 mL Intracatheter Q12H   Continuous Infusions: .  prismasol BGK 4/2.5 500 mL/hr at 08/05/20 0800  .  prismasol BGK 4/2.5 300 mL/hr at 08/23/2020 1741  . sodium chloride Stopped (08/01/20 1202)  . sodium chloride    . amiodarone 30 mg/hr (08/09/20 0700)  . calcium gluconate infusion for CRRT 20 g (08/09/20 0332)  .  citrate dextrose 430 mL/hr at 08/09/20 0425  . dexmedetomidine (PRECEDEX) IV infusion 0.1 mcg/kg/hr (08/09/20 0700)  . dextrose    . feeding supplement (VITAL 1.5 CAL) 45 mL/hr at 08/09/20 0500  . fentaNYL infusion INTRAVENOUS 50 mcg/hr (08/09/20 0700)  . heparin 1,550 Units/hr (08/09/20 0700)  . impella catheter heparin 50 unit/mL in dextrose 5% 11.9 mL/hr at 07/31/20 0148  . meropenem (MERREM) IV Stopped (08/09/20 0557)  . norepinephrine (LEVOPHED) Adult infusion 11 mcg/min (08/09/20 0700)  . prismasol BGK 4/2.5 1,500 mL/hr at 08/09/20 0419   PRN Meds:.sodium chloride, Place/Maintain arterial line **AND** sodium chloride, acetaminophen (TYLENOL) oral liquid 160 mg/5 mL, dextrose, fentaNYL, fentaNYL (SUBLIMAZE) injection, heparin, metoprolol tartrate, ondansetron (ZOFRAN) IV, sodium chloride, sodium chloride flush, traMADol  ABG    Component Value Date/Time   PHART 7.430 08/07/2020 0633   PCO2ART 58.4 (H) 08/07/2020 0633   PO2ART 101 08/07/2020 0633   HCO3 38.6 (H) 08/07/2020 0633   TCO2 40 (H) 08/09/2020 0626   ACIDBASEDEF 2.0 08/05/2020 0400   O2SAT 46.8 08/09/2020 0430    A/P  1. Dialysis dependent AKI, progressive, nonoliguric 2/2 cardiorenal syndrome probably some ATN.  vascath now in for 8 days 2. Acute systolic CHF / ICM; impella, off inotropes; massive vol overload; slowly improving-  CVP 11 this AM-  Cards in room-  Can back off on UF to keep even-  Still edematous but because is third spacing 3. CAD s/p  5V CABG 4/26 4. Anemia-  Have added ESA  5. DM2 6. PAD hx/o Fem Pop Bypass 7. Diuretic Resistance despite lasix gtt and metolazone-  Volume status correcting with CRRT  8. Fevers,  9. VDRF 10. Hypophoshatemia on CRRT- replete PRN 11. Numerous ischemic bilateral posterior CVAs, neurology following 12. Hyperkalemia,- now all baths 4 K    Continue CRRT -    agree with goals of care conversation, grim prognosis.  Partial code-  But proceeding with peg and trach.   Therefore if he survives and remains RRT dep his only long term dispo option is Ken Caryl, MD  [employer]

## 2020-08-09 NOTE — Progress Notes (Signed)
NEUROLOGY CONSULTATION PROGRESS NOTE   Date of service: Aug 09, 2020 Patient Name: Chadwin Viergutz MRN:  [id number] DOB:  24-Jun-1950  Brief HPI  Taz Winkowski is a 70 y.o. male with PMH significant for hypertension, hyperlipidemia, type 2 diabetes mellitus, peripheral vascular disease,OSA not on CPAP, CVA, AAA, left foot amputation s/o fem pop bypass, and heart failure who presented to the ED following stress test evidence of EF < 20% and with acute ischemia requiring a CABG x 5 for severe CAD identified on heart catheterization. Following procedure patient with multiple complications: low flow on Impella, hematoma impairing hemodynamics requiring bedside surgical exploration and prolonged encephalopathy and inability to wean from ventilator / requiring sedation to tolerate ETT.  Unable to get MRI Brain. Initial CTH on 5/6 demonstrated multiple posterior circulation infarcts within the bilateral cerebellum, right middle cerebellar peduncle, left pons, and cortical/subcortical right occipital lobe. These infarcts are new as compared to the head CT of 03/22/2019 and likely acute/early subacute. Repeat CTH without contrast on 5/10 with multiple small infarcts again. No new intracranial abnormalities.  Initial neuro exam on Precedex and Fentanyl with preserved brainstem reflexes, flaccid bilateral lower extremities and right upper extremity, left upper extremity withdraws with application of noxious stimuli. Encephalopathy likely multifactorial, with multifocal strokes and possible anoxic brain injury on the DDx.  Family would like neurologic assessment off sedation to get a better understanding of his deficits prior to considering next steps.  He had tracheostomy on 5/10 with plan for PEG tube placement.  Primary team working on weaning off sedation.   Interval Hx   Fever with Tmax of 102.4 yesterday afternoon with leukocytosis. Started on Meropenem, Precedex weaned down to 0.'1mg'$ /Kg/hr, Fentanyl down  to 70mg/Hr.  Vitals   Vitals:   08/09/20 0615 08/09/20 0630 08/09/20 0645 08/09/20 0700  BP:      Pulse: 76 72 84 73  Resp: (!) 21 (!) '21 14 14  '$ Temp:      TempSrc:      SpO2: 100% 100% 100% 100%  Weight:      Height:         Body mass index is 31.41 kg/m.  Physical Exam   General: Laying comfortably in bed; trach in place. HENT: Normal oropharynx and mucosa. Normal external appearance of ears and nose. Neck: Supple, no pain or tenderness CV: No JVD. No peripheral edema. Pulmonary: Symmetric Chest rise. Breathing over Vent. Wound dressings in place, Abdomen: Soft to touch, non-tender. Ext: No cyanosis, edema, L foot amputation. Skin: No rash. Normal palpation of skin. Musculoskeletal: Normal digits and nails by inspection. No clubbing.  Neurologic Examination  Mental status/Cognition: eyes partially open when I approach him. Opens eyes a bit more when I yell out his name. No response to loud clap. Asymmetric grimace to nares stimulation with R upper and lower facial droop, does not close R eye. Vigorously moves head side to side with nares stimulation. Attempts to bring his LUE closer to his face with nares stimulation.  Brainstem and Cranial nerve reflexes: Corneals positive BL, weak on the right. Pupils 319mBL, round and reactive to light R Facial droop involving R upper and lower face.  Sensory/Motor: Tone: Increased extensor tone in BL upper extremities. Noted to have spontaneous movements in all extremities with a lot more spontaneous movement in LUE compared to RUE with spontaneous antigravity movement noted in LUE. He also seems to move his LLE more so than RLE but does have some spontaneous movement in the RLE.  No response to pinch in any of the extremities.  Reflexes:  Right Left Comments  Pectoralis      Biceps (C5/6) 2 2   Brachioradialis (C5/6) 2 2    Triceps (C6/7) 2 2    Patellar (L3/4) 2 2    Achilles (S1)      Hoffman      Plantar     Jaw jerk     Coordination/Complex Motor:  Unable to assess but no obvious tremors.  Labs   Basic Metabolic Panel:  Lab Results  Component Value Date   NA 137 08/09/2020   K 4.2 08/09/2020   CO2 40 (H) 08/09/2020   GLUCOSE 282 (H) 08/09/2020   BUN 34 (H) 08/09/2020   CREATININE 2.00 (H) 08/09/2020   CALCIUM 9.1 08/09/2020   GFRNONAA 33 (L) 08/09/2020   GFRAA >60 08/08/2019   HbA1c:  Lab Results  Component Value Date   HGBA1C 11.6 (H) 07/23/2020   LDL:  Lab Results  Component Value Date   LDLCALC 86 07/05/2020   Urine Drug Screen:     Component Value Date/Time   LABOPIA POSITIVE (A) 01/17/2019 1928   COCAINSCRNUR NONE DETECTED 01/17/2019 1928   LABBENZ NONE DETECTED 01/17/2019 1928   AMPHETMU NONE DETECTED 01/17/2019 1928   THCU NONE DETECTED 01/17/2019 1928   LABBARB NONE DETECTED 01/17/2019 1928    Alcohol Level No results found for: ETH No results found for: PHENYTOIN, ZONISAMIDE, LAMOTRIGINE, LEVETIRACETA No results found for: PHENYTOIN, PHENOBARB, VALPROATE, CBMZ  Imaging and Diagnostic studies   [facility] without contrast 08/04/20: Multiple posterior circulation infarcts within the bilateral cerebellum, right middle cerebellar peduncle, left pons, and cortical/subcortical right occipital lobe. These infarcts are new as compared to the head CT of 03/22/2019 and likely acute/early subacute. No evidence of hemorrhagic conversion. No significant mass effect at this time. Chronic right basal ganglia lacunar infarct, new from the prior CT. Mild generalized parenchymal atrophy. Paranasal sinus disease and bilateral mastoid effusions in the presence of life-support tubes.  [place] without contrast 08/17/2020: Multiple small infarcts again identified as above. No new intracranial abnormalities.  Impression   Hashem Filyaw is a 70 y.o. male who presented to the ED following stress test evidence of EF < 20% and with acute ischemia requiring a CABG x 5 for severe CAD identified on heart  catheterization. Following procedure patient with multiple complications: low flow on Impella, hematoma impairing hemodynamics requiring bedside surgical exploration and prolonged encephalopathy and inability to wean from ventilator / requiring sedation to tolerate ETT.  CTH with multiple posterior circulation infarcts. Exam with concerns for R sided deficit and more spontaneous movement in the LUE and LLE compared to the Right and a R upper and lower facial droop.  Is spoke to patient's daughter Ms. Felix Pacini and he did not have any baseline R sided weakness. He reported pain and weakness all over but no obvious unilateral weakness that they are aware of. He is R handed.  I suspect that the R sided weakness might be due to the pontine stroke on CT head, thou its tiny and he does have some movement on the right. I do think that the likely explanation for the noted posterior circulation stroke is cardioembolic in the setting of complicated CABG.  Recommendations  - rEEG ordered and pending. - Lipid panel and HbA1c ordered - Will need vessel imaging with either CT Angio head and neck or MR Angio head without contrast along with US Carotid duplex when stable. - We  will continue to follow along.  ______________________________________________________________________   Thank you for the opportunity to take part in the care of this patient. If you have any further questions, please contact the neurology consultation attending.  Signed,  [place] Pager Number [phone]

## 2020-08-09 NOTE — Progress Notes (Signed)
Progress Note from the Palliative Medicine Team at [facility]   Patient Name: Danny Carpenter        Date: 08/09/2020 DOB: December 23, 1950  Age: 70 y.o. MRN#: [id number] Attending Physician: Danny Olds, MD Primary Care Physician: Danny Grammes, MD Admit Date: 07/10/2020   Medical records reviewed   70 y.o. male  admitted on 07/08/2020 with  past medical history significant for hypertension, hyperlipidemia, type 2 diabetes, CVA, AAA, left foot amputation s/p fem pop bypass and heart failure who presented to the emergency department 4/21 after undergoing outpatient stress test that revealed EF of 20% and acute ischemia.  Since admission patient has been evaluated by cardiology and cardiothoracic surgery and patient underwent TEE and left heart cath. LHC/RHC revealed severe three-vessel coronary artery disease with moderately elevated left heart and pulmonary artery pressures. At that time patient was placed on goal-directed medical therapy for acute systolic congestive heart failure and CTS surgery was consulted for CABG potential.  Patient underwent CABG x5 with Dr. Orvan Carpenter 4/26, post CABG CODE BLUE called due to low flow on Impella, CTS notified. Patient underwent bedside exploratory postoperative open heart where Dr. Julien Carpenter reopened the chest incision and hematoma was removed, patient's hemodynamics improved upon opening sternotomy.  PCCM consulted morning of 4/28 due to difficulty weaning ventilator.  He was trach today 08-07-20.   EEG in progess  CRRT initiated on 08/01/2020, currently remains on CRRT tolerating fluid removal.  Head CT on 08/04/2020 significant for multiple areas of posterior circulation infarct.  Per CCM note patient has made some improvement from cardiac standpoint, but neurologic recovery remains uncertain.  Long-term HD is a certainty.  Decision made  with  large family meeting to proceed with trach,, PEG and continued current medical interventions in hopes  of further evaluation neurologic standing for Danny Carpenter.   Open to all offered and available medical interventions to prolong life    This NP visited patient at the bedside as a follow up for palliative medicine needs and emotional support.   No family at bedside.        Danny Carpenter is the main contact but today I spoke to his daughter/HPOA Danny Carpenter to update and support.  Danny Carpenter is the contact because she has medical background.  All family work together in the patient best interest   Family understands the seriousness of the current medical/situation.  Family is hopeful for improvement.   This nurse practitioner informed  the patient/family and the attending that I will be out of the hospital until Monday morning.  If the patient is still hospitalized I will follow-up at that time.  Call palliative medicine team phone # [phone] with questions or concerns in the  Family meeting is planned for Monday at 1200  Questions and concerns addressed.  Emotional support offered.  Discussed with family  the importance of continued conversation with each other  and the  medical providers regarding overall plan of care and treatment options,  ensuring decisions are within the context of the patients values and GOCs.  All agree that quality is of utmost importance to Danny Carpenter     Outcomes over the next few days will help family make decision regarding aggressiveness of care.  Questions and concerns addressed     Total time spent on the unit was 35 minutes  Greater than 50% of the time was spent in counseling and coordination of care  Danny Lessen NP  Palliative Medicine Team Team Phone # [phone]  Pager [phone]

## 2020-08-09 NOTE — Procedures (Signed)
Patient Name: Danny Carpenter  MRN: [id number]  Epilepsy Attending: Lora Havens  Referring Physician/Provider: Dr Donnetta Simpers Date: 08/09/2020 Duration: 23.10 mins  Patient history: 70 year old male with altered mental status. EEG to evaluate for seizures.  Level of alertness:  lethargic   AEDs during EEG study: Clonazepam  Technical aspects: This EEG study was done with scalp electrodes positioned according to the 10-20 International system of electrode placement. Electrical activity was acquired at a sampling rate of '500Hz'$  and reviewed with a high frequency filter of '70Hz'$  and a low frequency filter of '1Hz'$ . EEG data were recorded continuously and digitally stored.   Description: No clear posterior dominant rhythm was seen.  EEG showed continuous generalized 3 to 5 Hz theta-delta slowing.  Generalized periodic discharges with triphasic morphology at 1 Hz were also noted. Hyperventilation and photic stimulation were not performed.     ABNORMALITY - Continuous slow, generalized - Periodic discharges with triphasic morphology, generalized ( GPDs)  IMPRESSION: This study showed generalized periodic discharges with triphasic morphology at 1 Hz which is on the ictal-interictal continuum.  However, the frequency and morphology is more commonly seen in toxic-metabolic encephalopathy. Additionally there is evidence of suggestive of moderate diffuse encephalopathy, nonspecific etiology. No seizures or definite epileptiform discharges were seen throughout the recording.  Danny Carpenter

## 2020-08-10 ENCOUNTER — Inpatient Hospital Stay (HOSPITAL_COMMUNITY): Payer: No Typology Code available for payment source

## 2020-08-10 DIAGNOSIS — R570 Cardiogenic shock: Secondary | ICD-10-CM | POA: Diagnosis not present

## 2020-08-10 DIAGNOSIS — Z95811 Presence of heart assist device: Secondary | ICD-10-CM

## 2020-08-10 DIAGNOSIS — J90 Pleural effusion, not elsewhere classified: Secondary | ICD-10-CM

## 2020-08-10 DIAGNOSIS — I25700 Atherosclerosis of coronary artery bypass graft(s), unspecified, with unstable angina pectoris: Secondary | ICD-10-CM

## 2020-08-10 DIAGNOSIS — Z951 Presence of aortocoronary bypass graft: Secondary | ICD-10-CM | POA: Diagnosis not present

## 2020-08-10 LAB — GLUCOSE, CAPILLARY
Glucose-Capillary: 153 mg/dL — ABNORMAL HIGH (ref 70–99)
Glucose-Capillary: 164 mg/dL — ABNORMAL HIGH (ref 70–99)
Glucose-Capillary: 123 mg/dL — ABNORMAL HIGH (ref 70–99)
Glucose-Capillary: 66 mg/dL — ABNORMAL LOW (ref 70–99)
Glucose-Capillary: 96 mg/dL (ref 70–99)
Glucose-Capillary: 91 mg/dL (ref 70–99)

## 2020-08-10 LAB — CBC
HCT: 24 % — ABNORMAL LOW (ref 39.0–52.0)
Hemoglobin: 7.2 g/dL — ABNORMAL LOW (ref 13.0–17.0)
MCH: 28.6 pg (ref 26.0–34.0)
MCHC: 30 g/dL (ref 30.0–36.0)
MCV: 95.2 fL (ref 80.0–100.0)
Platelets: 296 10*3/uL (ref 150–400)
RBC: 2.52 MIL/uL — ABNORMAL LOW (ref 4.22–5.81)
RDW: 19.2 % — ABNORMAL HIGH (ref 11.5–15.5)
WBC: 13.8 10*3/uL — ABNORMAL HIGH (ref 4.0–10.5)
nRBC: 0.1 % (ref 0.0–0.2)

## 2020-08-10 LAB — RENAL FUNCTION PANEL
Albumin: 2 g/dL — ABNORMAL LOW (ref 3.5–5.0)
Albumin: 2.1 g/dL — ABNORMAL LOW (ref 3.5–5.0)
Anion gap: 11 (ref 5–15)
Anion gap: 12 (ref 5–15)
BUN: 28 mg/dL — ABNORMAL HIGH (ref 8–23)
BUN: 25 mg/dL — ABNORMAL HIGH (ref 8–23)
CO2: 41 mmol/L — ABNORMAL HIGH (ref 22–32)
CO2: 41 mmol/L — ABNORMAL HIGH (ref 22–32)
Calcium: 8.9 mg/dL (ref 8.9–10.3)
Calcium: 8.9 mg/dL (ref 8.9–10.3)
Chloride: 86 mmol/L — ABNORMAL LOW (ref 98–111)
Chloride: 86 mmol/L — ABNORMAL LOW (ref 98–111)
Creatinine, Ser: 2.02 mg/dL — ABNORMAL HIGH (ref 0.61–1.24)
Creatinine, Ser: 1.84 mg/dL — ABNORMAL HIGH (ref 0.61–1.24)
GFR, Estimated: 35 mL/min — ABNORMAL LOW (ref >60)
GFR, Estimated: 39 mL/min — ABNORMAL LOW (ref >60)
Glucose, Bld: 161 mg/dL — ABNORMAL HIGH (ref 70–99)
Glucose, Bld: 74 mg/dL (ref 70–99)
Phosphorus: 3 mg/dL (ref 2.5–4.6)
Phosphorus: 2.9 mg/dL (ref 2.5–4.6)
Potassium: 4 mmol/L (ref 3.5–5.1)
Potassium: 4.2 mmol/L (ref 3.5–5.1)
Sodium: 138 mmol/L (ref 135–145)
Sodium: 139 mmol/L (ref 135–145)

## 2020-08-10 LAB — POCT I-STAT, CHEM 8
BUN: 27 mg/dL — ABNORMAL HIGH (ref 8–23)
BUN: 32 mg/dL — ABNORMAL HIGH (ref 8–23)
BUN: 25 mg/dL — ABNORMAL HIGH (ref 8–23)
BUN: 26 mg/dL — ABNORMAL HIGH (ref 8–23)
BUN: 26 mg/dL — ABNORMAL HIGH (ref 8–23)
Calcium, Ion: 0.32 mmol/L — CL (ref 1.15–1.40)
Calcium, Ion: 0.98 mmol/L — ABNORMAL LOW (ref 1.15–1.40)
Calcium, Ion: 0.38 mmol/L — CL (ref 1.15–1.40)
Calcium, Ion: 0.95 mmol/L — ABNORMAL LOW (ref 1.15–1.40)
Calcium, Ion: 0.93 mmol/L — ABNORMAL LOW (ref 1.15–1.40)
Chloride: 84 mmol/L — ABNORMAL LOW (ref 98–111)
Chloride: 85 mmol/L — ABNORMAL LOW (ref 98–111)
Chloride: 83 mmol/L — ABNORMAL LOW (ref 98–111)
Chloride: 85 mmol/L — ABNORMAL LOW (ref 98–111)
Chloride: 84 mmol/L — ABNORMAL LOW (ref 98–111)
Creatinine, Ser: 1.6 mg/dL — ABNORMAL HIGH (ref 0.61–1.24)
Creatinine, Ser: 1.9 mg/dL — ABNORMAL HIGH (ref 0.61–1.24)
Creatinine, Ser: 1.5 mg/dL — ABNORMAL HIGH (ref 0.61–1.24)
Creatinine, Ser: 1.9 mg/dL — ABNORMAL HIGH (ref 0.61–1.24)
Creatinine, Ser: 1.8 mg/dL — ABNORMAL HIGH (ref 0.61–1.24)
Glucose, Bld: 169 mg/dL — ABNORMAL HIGH (ref 70–99)
Glucose, Bld: 202 mg/dL — ABNORMAL HIGH (ref 70–99)
Glucose, Bld: 138 mg/dL — ABNORMAL HIGH (ref 70–99)
Glucose, Bld: 179 mg/dL — ABNORMAL HIGH (ref 70–99)
Glucose, Bld: 79 mg/dL (ref 70–99)
HCT: 24 % — ABNORMAL LOW (ref 39.0–52.0)
HCT: 25 % — ABNORMAL LOW (ref 39.0–52.0)
HCT: 23 % — ABNORMAL LOW (ref 39.0–52.0)
HCT: 26 % — ABNORMAL LOW (ref 39.0–52.0)
HCT: 26 % — ABNORMAL LOW (ref 39.0–52.0)
Hemoglobin: 8.2 g/dL — ABNORMAL LOW (ref 13.0–17.0)
Hemoglobin: 8.5 g/dL — ABNORMAL LOW (ref 13.0–17.0)
Hemoglobin: 7.8 g/dL — ABNORMAL LOW (ref 13.0–17.0)
Hemoglobin: 8.8 g/dL — ABNORMAL LOW (ref 13.0–17.0)
Hemoglobin: 8.8 g/dL — ABNORMAL LOW (ref 13.0–17.0)
Potassium: 3.6 mmol/L (ref 3.5–5.1)
Potassium: 3.9 mmol/L (ref 3.5–5.1)
Potassium: 3.6 mmol/L (ref 3.5–5.1)
Potassium: 3.7 mmol/L (ref 3.5–5.1)
Potassium: 4.1 mmol/L (ref 3.5–5.1)
Sodium: 138 mmol/L (ref 135–145)
Sodium: 139 mmol/L (ref 135–145)
Sodium: 138 mmol/L (ref 135–145)
Sodium: 140 mmol/L (ref 135–145)
Sodium: 137 mmol/L (ref 135–145)
TCO2: 37 mmol/L — ABNORMAL HIGH (ref 22–32)
TCO2: 41 mmol/L — ABNORMAL HIGH (ref 22–32)
TCO2: 38 mmol/L — ABNORMAL HIGH (ref 22–32)
TCO2: 41 mmol/L — ABNORMAL HIGH (ref 22–32)
TCO2: 42 mmol/L — ABNORMAL HIGH (ref 22–32)

## 2020-08-10 LAB — POCT I-STAT 7, (LYTES, BLD GAS, ICA,H+H)
Acid-Base Excess: 23 mmol/L — ABNORMAL HIGH (ref 0.0–2.0)
Bicarbonate: 48 mmol/L — ABNORMAL HIGH (ref 20.0–28.0)
Calcium, Ion: 0.96 mmol/L — ABNORMAL LOW (ref 1.15–1.40)
HCT: 24 % — ABNORMAL LOW (ref 39.0–52.0)
Hemoglobin: 8.2 g/dL — ABNORMAL LOW (ref 13.0–17.0)
O2 Saturation: 98 %
Patient temperature: 98.9
Potassium: 3.6 mmol/L (ref 3.5–5.1)
Sodium: 139 mmol/L (ref 135–145)
TCO2: 50 mmol/L — ABNORMAL HIGH (ref 22–32)
pCO2 arterial: 56.2 mmHg — ABNORMAL HIGH (ref 32.0–48.0)
pH, Arterial: 7.541 — ABNORMAL HIGH (ref 7.350–7.450)
pO2, Arterial: 106 mmHg (ref 83.0–108.0)

## 2020-08-10 LAB — ECHOCARDIOGRAM LIMITED
Height: 73 in
Weight: 3950.64 oz

## 2020-08-10 LAB — COOXEMETRY PANEL
Carboxyhemoglobin: 1.6 % — ABNORMAL HIGH (ref 0.5–1.5)
Methemoglobin: 1.1 % (ref 0.0–1.5)
O2 Saturation: 57.9 %
Total hemoglobin: 7.4 g/dL — ABNORMAL LOW (ref 12.0–16.0)

## 2020-08-10 LAB — MAGNESIUM: Magnesium: 1.9 mg/dL (ref 1.7–2.4)

## 2020-08-10 LAB — PREPARE RBC (CROSSMATCH)

## 2020-08-10 LAB — HEPARIN LEVEL (UNFRACTIONATED)
Heparin Unfractionated: 0.36 IU/mL (ref 0.30–0.70)
Heparin Unfractionated: 0.31 IU/mL (ref 0.30–0.70)

## 2020-08-10 LAB — LACTATE DEHYDROGENASE: LDH: 265 U/L — ABNORMAL HIGH (ref 98–192)

## 2020-08-10 LAB — HEMOGLOBIN AND HEMATOCRIT, BLOOD
HCT: 26.8 % — ABNORMAL LOW (ref 39.0–52.0)
Hemoglobin: 8.2 g/dL — ABNORMAL LOW (ref 13.0–17.0)

## 2020-08-10 LAB — CALCIUM, IONIZED: Calcium, Ionized, Serum: 4.3 mg/dL — ABNORMAL LOW (ref 4.5–5.6)

## 2020-08-10 MED ORDER — DEXTROSE 50 % IV SOLN
12.5000 g | INTRAVENOUS | Status: AC
  Administered 2020-08-10: 12.5 g via INTRAVENOUS

## 2020-08-10 MED ORDER — LEVALBUTEROL HCL 0.63 MG/3ML IN NEBU
0.6300 mg | INHALATION_SOLUTION | Freq: Three times a day (TID) | RESPIRATORY_TRACT | Status: DC
  Administered 2020-08-10 – 2020-08-22 (×34): 0.63 mg via RESPIRATORY_TRACT
  Filled 2020-08-10 (×37): qty 3

## 2020-08-10 MED ORDER — DEXTROSE 50 % IV SOLN
INTRAVENOUS | Status: AC
  Filled 2020-08-10: qty 50

## 2020-08-10 MED ORDER — AMIODARONE HCL 200 MG PO TABS
200.0000 mg | ORAL_TABLET | Freq: Every day | ORAL | Status: DC
  Administered 2020-08-10: 200 mg
  Filled 2020-08-10: qty 1

## 2020-08-10 MED ORDER — HYDROMORPHONE HCL 1 MG/ML PO LIQD
0.5000 mg | Freq: Four times a day (QID) | ORAL | Status: AC
  Administered 2020-08-12 – 2020-08-14 (×8): 0.5 mg via ORAL
  Filled 2020-08-10 (×8): qty 1

## 2020-08-10 MED ORDER — SODIUM CHLORIDE 0.9 % IV SOLN: 100.0000 mg | INTRAVENOUS | Status: DC

## 2020-08-10 MED ORDER — HYDROMORPHONE HCL 1 MG/ML PO LIQD: 0.5000 mg | Freq: Four times a day (QID) | ORAL | Status: DC

## 2020-08-10 MED ORDER — SODIUM CHLORIDE 0.9% IV SOLUTION: Freq: Once | INTRAVENOUS | Status: AC

## 2020-08-10 MED ORDER — HYDROMORPHONE HCL 1 MG/ML PO LIQD
1.0000 mg | Freq: Four times a day (QID) | ORAL | Status: AC
  Administered 2020-08-10 – 2020-08-12 (×8): 1 mg via ORAL
  Filled 2020-08-10 (×8): qty 1

## 2020-08-10 MED ORDER — HYDROMORPHONE HCL 1 MG/ML PO LIQD: 1.0000 mg | Freq: Four times a day (QID) | ORAL | Status: DC

## 2020-08-10 MED ORDER — SODIUM CHLORIDE 0.9 % IV SOLN
200.0000 mg | Freq: Once | INTRAVENOUS | Status: DC
  Filled 2020-08-10: qty 200

## 2020-08-10 MED ORDER — MIDODRINE HCL 5 MG PO TABS
10.0000 mg | ORAL_TABLET | Freq: Three times a day (TID) | ORAL | Status: DC
  Administered 2020-08-10 – 2020-08-19 (×24): 10 mg
  Filled 2020-08-10 (×26): qty 2

## 2020-08-10 MED ORDER — METOCLOPRAMIDE HCL 5 MG/5ML PO SOLN
10.0000 mg | Freq: Three times a day (TID) | ORAL | Status: DC
  Administered 2020-08-11 – 2020-08-12 (×5): 10 mg
  Filled 2020-08-10 (×6): qty 10

## 2020-08-10 MED ORDER — SORBITOL 70 % SOLN
300.0000 mL | TOPICAL_OIL | Freq: Once | ORAL | Status: AC
  Administered 2020-08-10: 300 mL via RECTAL
  Filled 2020-08-10: qty 90

## 2020-08-10 NOTE — Progress Notes (Signed)
PT Cancellation Note  Patient Details Name: Danny Carpenter MRN: [id number] DOB: 1950/05/08   Cancelled Treatment:    Reason Eval/Treat Not Completed: Medical issues which prohibited therapy.   RN asks to hold if need to attempt mobility.  Pt is not able to participate well at this time and RN would like an order to mobilize with a femoral access on CRRT before getting up to EOB/OOB.  Will check back 5/13 and progress as able. 08/10/2020  Ginger Carne., PT Acute Rehabilitation Services [phone]  (pager) [phone]  (office)   Tessie Fass Tomisha Reppucci 08/10/2020, 1:03 PM

## 2020-08-10 NOTE — Progress Notes (Signed)
Pt placed back on PSV 8/5 due to increased WOB. Pt tolerated 4 hrs ATC.

## 2020-08-10 NOTE — Progress Notes (Signed)
Hypoglycemic Event  CBG: 66  Treatment: D50 25 mL (12.5 gm)  Symptoms: Shaky  Follow-up CBG: Time:1659 CBG Result: 96  Possible Reasons for Event: Inadequate meal intake TF stopped.    Cherlyn Cushing

## 2020-08-10 NOTE — Progress Notes (Addendum)
[facility] DAILY ICU PROGRESS NOTE                   [address]            [place] [address]          [phone]   16 Days Post-Op Procedure(s) (LRB): EXPLORATION POST OPERATIVE OPEN HEART (N/A)  Total Length of Stay:  LOS: 21 days   Subjective: No change in neuro status  Objective: Vital signs in last 24 hours: Temp:  [99 F (37.2 C)-102.4 F (39.1 C)] 99.4 F (37.4 C) (05/12 0735) Pulse Rate:  [73-95] 79 (05/12 0800) Cardiac Rhythm: Atrial fibrillation (05/12 0400) Resp:  [11-27] 20 (05/12 0800) BP: (115-147)/(49-110) 127/72 (05/12 0800) SpO2:  [0 %-100 %] 99 % (05/12 0800) Arterial Line BP: (88-148)/(36-58) 124/49 (05/12 0800) FiO2 (%):  [40 %] 40 % (05/12 0740) Weight:  [841 kg] 112 kg (05/12 0458)  Filed Weights   08/22/2020 0500 08/09/20 0500 08/10/20 0458  Weight: 114 kg 108 kg 112 kg    Weight change: 4 kg   Hemodynamic parameters for last 24 hours: CVP:  [6 mmHg-12 mmHg] 9 mmHg  Intake/Output from previous day: 05/11 0701 - 05/12 0700 In: 4485 [I.V.:2669; NG/GT:1259.8; IV Piggyback:307.2] Out: 4136 [Stool:100]  Intake/Output this shift: Total I/O In: 171.1 [I.V.:116.1; NG/GT:55] Out: -   Current Meds: Scheduled Meds: . artificial tears  1 application Both Eyes Y6A  . aspirin EC  325 mg Oral Daily   Or  . aspirin  324 mg Per Tube Daily  . B-complex with vitamin C  1 tablet Per Tube Daily  . chlorhexidine gluconate (MEDLINE KIT)  15 mL Mouth Rinse BID  . Chlorhexidine Gluconate Cloth  6 each Topical Daily  . clonazepam  0.25 mg Per Tube BID  . darbepoetin (ARANESP) injection - NON-DIALYSIS  150 mcg Subcutaneous Q Wed-1800  . docusate  200 mg Per Tube Daily  . feeding supplement (PROSource TF)  45 mL Per Tube 5 X Daily  . fiber  1 packet Per Tube BID  . HYDROmorphone HCl  2 mg Per Tube Q6H  . insulin aspart  10 Units Subcutaneous Q4H  . insulin aspart  3-9 Units Subcutaneous Q4H  . insulin detemir  29 Units Subcutaneous Q12H   . levalbuterol  0.63 mg Nebulization Q6H  . mouth rinse  15 mL Mouth Rinse 10 times per day  . metoCLOPramide (REGLAN) injection  5 mg Intravenous Q8H  . midodrine  5 mg Per Tube TID WC  . pantoprazole sodium  40 mg Per Tube Daily  . polyethylene glycol  17 g Per Tube Daily  . QUEtiapine  25 mg Per Tube BID  . rosuvastatin  40 mg Per Tube Daily  . sennosides  10 mL Per Tube QHS  . sodium chloride flush  10-40 mL Intracatheter Q12H   Continuous Infusions: .  prismasol BGK 4/2.5 500 mL/hr at 08/05/20 0800  .  prismasol BGK 4/2.5 300 mL/hr at 08/10/20 0359  . sodium chloride Stopped (08/01/20 1202)  . sodium chloride    . amiodarone 30 mg/hr (08/10/20 0800)  . calcium gluconate infusion for CRRT 20 g (08/09/20 2246)  . citrate dextrose 3,000 mL (08/10/20 0526)  . dexmedetomidine (PRECEDEX) IV infusion Stopped (08/09/20 1004)  . dextrose    . DOBUTamine 2.5 mcg/kg/min (08/10/20 0800)  . feeding supplement (VITAL 1.5 CAL) 1,000 mL (08/10/20 0214)  . fentaNYL infusion INTRAVENOUS Stopped (08/09/20 1939)  . heparin  1,650 Units/hr (08/10/20 0800)  . impella catheter heparin 50 unit/mL in dextrose 5% 10.5 mL/hr at 08/09/20 2227  . meropenem (MERREM) IV Stopped (08/10/20 0555)  . norepinephrine (LEVOPHED) Adult infusion 18 mcg/min (08/10/20 0800)  . prismasol BGK 4/2.5 1,500 mL/hr at 08/10/20 0730   PRN Meds:.sodium chloride, Place/Maintain arterial line **AND** sodium chloride, acetaminophen (TYLENOL) oral liquid 160 mg/5 mL, dextrose, fentaNYL, fentaNYL (SUBLIMAZE) injection, heparin, metoprolol tartrate, ondansetron (ZOFRAN) IV, sodium chloride, sodium chloride flush, traMADol  General appearance: obtunded Heart: irregularly irregular rhythm Lungs: clear anteriorly Abdomen: obese, mod distension Extremities: + edema Wound: incis ok  Lab Results: CBC: Recent Labs    08/09/20 0345 08/09/20 0413 08/10/20 0209 08/10/20 0333  WBC 17.0*  --   --  13.8*  HGB 7.5*   < > 8.2* 7.2*   HCT 24.9*   < > 24.0* 24.0*  PLT 277  --   --  296   < > = values in this interval not displayed.   BMET:  Recent Labs    08/09/20 1600 08/09/20 1643 08/10/20 0209 08/10/20 0333  NA 141   < > 138 138  K 4.2   < > 3.9 4.0  CL 86*   < > 85* 86*  CO2 40*  --   --  41*  GLUCOSE 155*   < > 169* 161*  BUN 30*   < > 32* 28*  CREATININE 2.00*   < > 1.90* 2.02*  CALCIUM 9.1  --   --  8.9   < > = values in this interval not displayed.    CMET: Lab Results  Component Value Date   WBC 13.8 (H) 08/10/2020   HGB 7.2 (L) 08/10/2020   HCT 24.0 (L) 08/10/2020   PLT 296 08/10/2020   GLUCOSE 161 (H) 08/10/2020   CHOL 159 07/07/2020   TRIG 134 07/31/2020   HDL 35 (L) 07/02/2020   LDLCALC 86 07/07/2020   ALT 14 07/27/2020   AST 21 07/27/2020   NA 138 08/10/2020   K 4.0 08/10/2020   CL 86 (L) 08/10/2020   CREATININE 2.02 (H) 08/10/2020   BUN 28 (H) 08/10/2020   CO2 41 (H) 08/10/2020   INR 1.5 (H) 07/24/2020   HGBA1C 11.6 (H) 07/19/2020      PT/INR: No results for input(s): LABPROT, INR in the last 72 hours. Radiology: EEG adult  Result Date: 08/09/2020 Lora Havens, MD     08/09/2020 10:35 AM Patient Name: Danny Carpenter MRN: [id number] Epilepsy Attending: Lora Havens Referring Physician/Provider: Dr Donnetta Simpers Date: 08/09/2020 Duration: 23.10 mins Patient history: 70 year old male with altered mental status. EEG to evaluate for seizures. Level of alertness:  lethargic AEDs during EEG study: Clonazepam Technical aspects: This EEG study was done with scalp electrodes positioned according to the 10-20 International system of electrode placement. Electrical activity was acquired at a sampling rate of 500Hz  and reviewed with a high frequency filter of 70Hz  and a low frequency filter of 1Hz . EEG data were recorded continuously and digitally stored. Description: No clear posterior dominant rhythm was seen.  EEG showed continuous generalized 3 to 5 Hz theta-delta slowing.   Generalized periodic discharges with triphasic morphology at 1 Hz were also noted. Hyperventilation and photic stimulation were not performed.   ABNORMALITY - Continuous slow, generalized - Periodic discharges with triphasic morphology, generalized ( GPDs) IMPRESSION: This study showed generalized periodic discharges with triphasic morphology at 1 Hz which is on the ictal-interictal continuum.  However, the frequency  and morphology is more commonly seen in toxic-metabolic encephalopathy. Additionally there is evidence of suggestive of moderate diffuse encephalopathy, nonspecific etiology. No seizures or definite epileptiform discharges were seen throughout the recording. [name]   Recent Results (from the past 720 hour(s))  Resp Panel by RT-PCR (Flu A&B, Covid) Nasopharyngeal Swab     Status: None   Collection Time: 07/19/2020  1:11 PM   Specimen: Nasopharyngeal Swab; Nasopharyngeal(NP) swabs in vial transport medium  Result Value Ref Range Status   SARS Coronavirus 2 by RT PCR NEGATIVE NEGATIVE Final    Comment: (NOTE) SARS-CoV-2 target nucleic acids are NOT DETECTED.  The SARS-CoV-2 RNA is generally detectable in upper respiratory specimens during the acute phase of infection. The lowest concentration of SARS-CoV-2 viral copies this assay can detect is 138 copies/mL. A negative result does not preclude SARS-Cov-2 infection and should not be used as the sole basis for treatment or other patient management decisions. A negative result may occur with  improper specimen collection/handling, submission of specimen other than nasopharyngeal swab, presence of viral mutation(s) within the areas targeted by this assay, and inadequate number of viral copies(<138 copies/mL). A negative result must be combined with clinical observations, patient history, and epidemiological information. The expected result is Negative.  Fact Sheet for Patients:  EntrepreneurPulse.com.au  Fact  Sheet for Healthcare Providers:  IncredibleEmployment.be  This test is no t yet approved or cleared by the [place] FDA and  has been authorized for detection and/or diagnosis of SARS-CoV-2 by FDA under an Emergency Use Authorization (EUA). This EUA will remain  in effect (meaning this test can be used) for the duration of the COVID-19 declaration under Section 564(b)(1) of the Act, 21 U.S.C.section 360bbb-3(b)(1), unless the authorization is terminated  or revoked sooner.       Influenza A by PCR NEGATIVE NEGATIVE Final   Influenza B by PCR NEGATIVE NEGATIVE Final    Comment: (NOTE) The Xpert Xpress SARS-CoV-2/FLU/RSV plus assay is intended as an aid in the diagnosis of influenza from Nasopharyngeal swab specimens and should not be used as a sole basis for treatment. Nasal washings and aspirates are unacceptable for Xpert Xpress SARS-CoV-2/FLU/RSV testing.  Fact Sheet for Patients: EntrepreneurPulse.com.au  Fact Sheet for Healthcare Providers: IncredibleEmployment.be  This test is not yet approved or cleared by the [place] FDA and has been authorized for detection and/or diagnosis of SARS-CoV-2 by FDA under an Emergency Use Authorization (EUA). This EUA will remain in effect (meaning this test can be used) for the duration of the COVID-19 declaration under Section 564(b)(1) of the Act, 21 U.S.C. section 360bbb-3(b)(1), unless the authorization is terminated or revoked.  Performed at [facility], [place] [address] [place], [place] [address]   Surgical pcr screen     Status: None   Collection Time: 07/24/20  7:58 PM   Specimen: Nasal Mucosa; Nasal Swab  Result Value Ref Range Status   MRSA, PCR NEGATIVE NEGATIVE Final   Staphylococcus aureus NEGATIVE NEGATIVE Final    Comment: (NOTE) The Xpert SA Assay (FDA approved for NASAL specimens in patients 11 years of age and older), is one component of a  comprehensive surveillance program. It is not intended to diagnose infection nor to guide or monitor treatment. Performed at [facility], [place] [address], [place], [place] [address]   Culture, blood (routine x 2)     Status: None   Collection Time: 07/27/20  8:18 AM   Specimen: BLOOD  Result Value  Ref Range Status   Specimen Description BLOOD RIGHT ANTECUBITAL  Final   Special Requests   Final    BOTTLES DRAWN AEROBIC AND ANAEROBIC Blood Culture adequate volume   Culture   Final    NO GROWTH 5 DAYS Performed at [facility], [address] [place], [place] [address]    Report Status 08/01/2020 FINAL  Final  Culture, blood (routine x 2)     Status: None   Collection Time: 07/27/20  8:25 AM   Specimen: BLOOD RIGHT HAND  Result Value Ref Range Status   Specimen Description BLOOD RIGHT HAND  Final   Special Requests   Final    BOTTLES DRAWN AEROBIC AND ANAEROBIC Blood Culture adequate volume   Culture   Final    NO GROWTH 5 DAYS Performed at [facility], [place] [address] [place], [place] [address]    Report Status 08/01/2020 FINAL  Final  Culture, Respiratory w Gram Stain     Status: None   Collection Time: 07/27/20  2:42 PM   Specimen: Tracheal Aspirate; Respiratory  Result Value Ref Range Status   Specimen Description TRACHEAL ASPIRATE  Final   Special Requests NONE  Final   Gram Stain   Final    RARE WBC PRESENT, PREDOMINANTLY PMN RARE GRAM NEGATIVE RODS    Culture   Final    RARE Normal respiratory flora-no Staph aureus or Pseudomonas seen Performed at [facility], [address], [place], [place] [address]    Report Status 07/30/2020 FINAL  Final  Culture, Respiratory w Gram Stain     Status: None   Collection Time: 08/06/20  5:13 PM   Specimen: Tracheal Aspirate; Respiratory  Result Value Ref Range Status   Specimen Description TRACHEAL ASPIRATE  Final   Special Requests NONE  Final   Gram Stain   Final    ABUNDANT WBC PRESENT,BOTH PMN  AND MONONUCLEAR FEW GRAM POSITIVE COCCI IN PAIRS MODERATE GRAM NEGATIVE RODS    Culture   Final    MODERATE Normal respiratory flora-no Staph aureus or Pseudomonas seen Performed at [facility], [address], [place], [place] [address]    Report Status 08/12/2020 FINAL  Final    Assessment/Plan: S/P Procedure(s) (LRB): EXPLORATION POST OPERATIVE OPEN HEART (N/A)  1 Tmax 102.4 2 conts CRRT per nephrology keeping even or a little positive 3 CO-OX 57- conts impella/ gtts per AHF team  4 leukocytosis is trending down- on MERREM 5 H/H down a bit - may need to consider transfusion soon  6 Vent - as per PCCM 7 neuro diagnostic w/u conts to help with goals of care    Danny Giovanni PA-C Pager [phone] 08/10/2020 8:03 AM   Pt seen and examined; agree with documentation. Nearing point of having Impella removed. Will discuss with Dr. Aundra Dubin. Atsushi Yom Z. Orvan Seen, [place]

## 2020-08-10 NOTE — Progress Notes (Signed)
[place] for Heparin Indication: Impella 5.5  No Known Allergies  Patient Measurements: Height: '6\' 1"'$  (185.4 cm) Weight: 112 kg (246 lb 14.6 oz) IBW/kg (Calculated) : 79.9 Heparin Dosing Weight: 102.8 kg  Vital Signs: Temp: 99.8 F (37.7 C) (05/12 1622) Temp Source: Oral (05/12 1622) BP: 135/68 (05/12 1800) Pulse Rate: 82 (05/12 1800)  Labs: Recent Labs    08/29/2020 0747 08/03/2020 1304 08/09/20 0345 08/09/20 0413 08/09/20 1700 08/09/20 1805 08/10/20 0333 08/10/20 0901 08/10/20 1016 08/10/20 1023 08/10/20 1759  HGB 7.5*   < > 7.5*   < >  --    < > 7.2* 8.2* 8.8* 7.8*  --   HCT 25.0*   < > 24.9*   < >  --    < > 24.0* 24.0* 26.0* 23.0*  --   PLT 236  --  277  --   --   --  296  --   --   --   --   HEPARINUNFRC  --   --   --    < > <0.10*  --  0.36  --   --   --  0.31  CREATININE  --    < > 2.11*   < >  --    < > 2.02*  --  1.50* 1.90* 1.84*   < > = values in this interval not displayed.    Estimated Creatinine Clearance: 50.4 mL/min (A) (by C-G formula based on SCr of 1.84 mg/dL (H)).   Assessment: 70 yo M presents with NSTEMI and multivessel CAD, now s/p CABG with Impella support. Pharmacy asked to manage systemic heparin.  Rectal bleeding has been ongoing but is stable, bloody sputum has resolved. Heparin held for trach, ok to restart in 4 hours per CCM.  Heparin level this afternoon is within goal range at 0.31.   Goal of Therapy:  Heparin level 0.2-0.3 units/ml Monitor platelets by anticoagulation protocol: Yes   Plan:  Continue heparin purge solution (50 units/ml) Continue IV heparin at 1650 units/h Continue q12h coags for now   Arrie Senate, PharmD, BCPS, [facility] Clinical Pharmacist [phone] Please check AMION for all [place] numbers 08/10/2020

## 2020-08-10 NOTE — Progress Notes (Signed)
[place] for Heparin Indication: Impella 5.5  No Known Allergies  Patient Measurements: Height: '6\' 1"'$  (185.4 cm) Weight: 112 kg (246 lb 14.6 oz) IBW/kg (Calculated) : 79.9 Heparin Dosing Weight: 102.8 kg  Vital Signs: Temp: 98.8 F (37.1 C) (05/12 1130) Temp Source: Oral (05/12 1130) BP: 143/96 (05/12 1100) Pulse Rate: 80 (05/12 1130)  Labs: Recent Labs    08/03/2020 0747 07/31/2020 1304 08/09/20 0345 08/09/20 0413 08/09/20 0500 08/09/20 0618 08/09/20 1700 08/09/20 1805 08/10/20 0333 08/10/20 0901 08/10/20 1016 08/10/20 1023  HGB 7.5*   < > 7.5*   < >  --    < >  --    < > 7.2* 8.2* 8.8* 7.8*  HCT 25.0*   < > 24.9*   < >  --    < >  --    < > 24.0* 24.0* 26.0* 23.0*  PLT 236  --  277  --   --   --   --   --  296  --   --   --   HEPARINUNFRC  --   --   --   --  0.17*  --  <0.10*  --  0.36  --   --   --   CREATININE  --    < > 2.11*   < >  --    < >  --    < > 2.02*  --  1.50* 1.90*   < > = values in this interval not displayed.    Estimated Creatinine Clearance: 48.8 mL/min (A) (by C-G formula based on SCr of 1.9 mg/dL (H)).   Assessment: 70 yo M presents with NSTEMI and multivessel CAD, now s/p CABG with Impella support. Pharmacy asked to manage systemic heparin.  Rectal bleeding has been ongoing but is stable, bloody sputum has resolved. Heparin held for trach, ok to restart in 4 hours per CCM.  Heparin level this morning is within goal range.  No overt bleeding or complications noted.  On IV heparin at 1650 units/hr systemically, plus 520 units/hr from Impella purge.  Goal of Therapy:  Heparin level 0.2-0.3 units/ml Monitor platelets by anticoagulation protocol: Yes   Plan:  Continue heparin purge solution (50 units/ml) Continue IV heparin at 1650 units/h Continue q12h coags for now   [place] Crane, Roylene Reason, [facility] Clinical Pharmacist  08/10/2020 12:14 PM   [facility] pharmacy phone numbers are listed on [email or web address]

## 2020-08-10 NOTE — Progress Notes (Signed)
Admit: 07/22/2020 LOS: 21  32M progressive AKI after CABG with prolonged shock (cardiogenic and ? Vasodilatory/septic), failure to achieve target volume status despite aggressive diuretics.    Current CRRT Prescription: Start Date: 08/01/20 Catheter: L Fem Temp IJ CCM placed 5/3 BFR: 300 Pre Blood Pump: 500 4K DFR: 1500 4K Replacement Rate: 300 4K Goal UF: keep even Anticoagulation: Citrate Clotting: None since transition to citrate  S: Stable on CRRT, have changed to keeping even-  Even ran a little positive overnight- one cartridge change K 4.0, P 3.0  s/p trach 5/10 No change in neuro status-  eeg-  Toxic metabolic encephalopathy    O: 05/11 0701 - 05/12 0700 In: 4304.5 [I.V.:2553.8; NG/GT:1204.8; IV Piggyback:307.2] Out: 3897 [Stool:50]  PE Eyes open but no commands, restless in bed Lungs-  CBS bilat- vent CV- RRR- fresh sternotomy Abd-  Distended Ext-  Pitting edema to dep areas   Filed Weights   08/29/2020 0500 08/09/20 0500 08/10/20 0458  Weight: 114 kg 108 kg 112 kg    Recent Labs  Lab 08/09/20 0345 08/09/20 0413 08/09/20 1600 08/09/20 1643 08/10/20 0204 08/10/20 0209 08/10/20 0333  NA 140   < > 141   < > 139 138 138  K 4.3   < > 4.2   < > 3.6 3.9 4.0  CL 86*   < > 86*   < > 84* 85* 86*  CO2 40*  --  40*  --   --   --  41*  GLUCOSE 220*   < > 155*   < > 202* 169* 161*  BUN 30*   < > 30*   < > 27* 32* 28*  CREATININE 2.11*   < > 2.00*   < > 1.60* 1.90* 2.02*  CALCIUM 9.1  --  9.1  --   --   --  8.9  PHOS 3.6  --  2.9  --   --   --  3.0   < > = values in this interval not displayed.   Recent Labs  Lab 08/04/20 0800 08/04/20 0850 08/17/2020 0747 08/29/2020 1304 08/09/20 0345 08/09/20 0413 08/10/20 0204 08/10/20 0209 08/10/20 0333  WBC 18.1*   < > 18.1*  --  17.0*  --   --   --  13.8*  NEUTROABS 13.2*  --   --   --   --   --   --   --   --   HGB 9.2*   < > 7.5*   < > 7.5*   < > 8.5* 8.2* 7.2*  HCT 29.8*   < > 25.0*   < > 24.9*   < > 25.0* 24.0*  24.0*  MCV 92.8   < > 93.6  --  95.0  --   --   --  95.2  PLT 158   < > 236  --  277  --   --   --  296   < > = values in this interval not displayed.    Scheduled Meds: . artificial tears  1 application Both Eyes Y6V  . aspirin EC  325 mg Oral Daily   Or  . aspirin  324 mg Per Tube Daily  . B-complex with vitamin C  1 tablet Per Tube Daily  . chlorhexidine gluconate (MEDLINE KIT)  15 mL Mouth Rinse BID  . Chlorhexidine Gluconate Cloth  6 each Topical Daily  . clonazepam  0.25 mg Per Tube BID  . darbepoetin (ARANESP) injection -  NON-DIALYSIS  150 mcg Subcutaneous Q Wed-1800  . docusate  200 mg Per Tube Daily  . feeding supplement (PROSource TF)  45 mL Per Tube 5 X Daily  . fiber  1 packet Per Tube BID  . HYDROmorphone HCl  2 mg Per Tube Q6H  . insulin aspart  10 Units Subcutaneous Q4H  . insulin aspart  3-9 Units Subcutaneous Q4H  . insulin detemir  29 Units Subcutaneous Q12H  . levalbuterol  0.63 mg Nebulization Q6H  . mouth rinse  15 mL Mouth Rinse 10 times per day  . metoCLOPramide (REGLAN) injection  5 mg Intravenous Q8H  . midodrine  5 mg Per Tube TID WC  . pantoprazole sodium  40 mg Per Tube Daily  . polyethylene glycol  17 g Per Tube Daily  . QUEtiapine  25 mg Per Tube BID  . rosuvastatin  40 mg Per Tube Daily  . sennosides  10 mL Per Tube QHS  . sodium chloride flush  10-40 mL Intracatheter Q12H   Continuous Infusions: .  prismasol BGK 4/2.5 500 mL/hr at 08/05/20 0800  .  prismasol BGK 4/2.5 300 mL/hr at 08/10/20 0359  . sodium chloride Stopped (08/01/20 1202)  . sodium chloride    . amiodarone 30 mg/hr (08/10/20 0600)  . calcium gluconate infusion for CRRT 20 g (08/09/20 2246)  . citrate dextrose 3,000 mL (08/10/20 0526)  . dexmedetomidine (PRECEDEX) IV infusion Stopped (08/09/20 1004)  . dextrose    . DOBUTamine 2.5 mcg/kg/min (08/10/20 0600)  . feeding supplement (VITAL 1.5 CAL) 1,000 mL (08/10/20 0214)  . fentaNYL infusion INTRAVENOUS Stopped (08/09/20 1939)   . heparin 1,650 Units/hr (08/10/20 0600)  . impella catheter heparin 50 unit/mL in dextrose 5% 10.5 mL/hr at 08/09/20 2227  . meropenem (MERREM) IV Stopped (08/10/20 0555)  . norepinephrine (LEVOPHED) Adult infusion 18 mcg/min (08/10/20 0600)  . prismasol BGK 4/2.5 1,500 mL/hr at 08/10/20 0359   PRN Meds:.sodium chloride, Place/Maintain arterial line **AND** sodium chloride, acetaminophen (TYLENOL) oral liquid 160 mg/5 mL, dextrose, fentaNYL, fentaNYL (SUBLIMAZE) injection, heparin, metoprolol tartrate, ondansetron (ZOFRAN) IV, sodium chloride, sodium chloride flush, traMADol  ABG    Component Value Date/Time   PHART 7.430 08/07/2020 0633   PCO2ART 58.4 (H) 08/07/2020 0633   PO2ART 101 08/07/2020 0633   HCO3 38.6 (H) 08/07/2020 0633   TCO2 41 (H) 08/10/2020 0209   ACIDBASEDEF 2.0 08/05/2020 0400   O2SAT 57.9 08/10/2020 0333    A/P  1. Dialysis dependent AKI, progressive, nonoliguric 2/2 cardiorenal syndrome probably some ATN.  vascath now in for 9 days 2. Acute systolic CHF / ICM; impella, off inotropes; massive vol overload; slowly improving-  CVP 11 yest-  Cards in room-  backed off on UF to keep even-  Still edematous but because is third spacing 3. CAD s/p  5V CABG 4/26 4. Anemia-  Have added ESA -  Transfuse PRN 5. DM2 6. PAD hx/o Fem Pop Bypass 7. Diuretic Resistance -  Volume status correcting with CRRT  8. Fevers,  9. VDRF 10. Hypophoshatemia on CRRT- replete PRN 11. Numerous ischemic bilateral posterior CVAs, neurology following 12. Hyperkalemia,- now all baths 4 K    Continue CRRT -    agree with goals of care conversation, grim prognosis given his prolonged neurological insult.  Partial code-  But proceeding with peg and trach.   if he survives and remains RRT dep his only long term dispo option is [place], [place]  [employer]

## 2020-08-10 NOTE — Progress Notes (Signed)
Palliative Medicine RN Note: Per HCPOA discussion w NP Wadie Lessen yesterday, I adjusted the demographics to have Tonita Cong be first contact.  Marjie Skiff Phoenyx Melka, RN, BSN, [facility] Palliative Medicine Team 08/10/2020 8:30 AM Office [phone]

## 2020-08-10 NOTE — Progress Notes (Signed)
NAMEPaula Carpenter, MRN:  [id number], DOB:  [birth year], LOS: 21 ADMISSION DATE:  07/10/2020, CONSULTATION DATE: 07/28/2018 REFERRING MD: Dr. Orvan Seen, CHIEF COMPLAINT: Ventilator dependent  History of Present Illness:  Danny Carpenter is a 70 year old male with past medical history significant for hypertension, hyperlipidemia, type 2 diabetes, CVA, AAA, left foot amputation s/p fem pop bypass and heart failure who presented to the emergency department 4/21 after undergoing outpatient stress test that revealed EF of 20% and acute ischemia.  Since admission patient has been evaluated by cardiology and cardiothoracic surgery and patient underwent TEE and left heart cath.  LHC/RHC revealed severe three-vessel coronary artery disease with moderately elevated left heart and pulmonary artery pressures.  At that time patient was placed on goal-directed medical therapy for acute systolic congestive heart failure and CTS surgery was consulted for CABG potential.  Patient underwent CABG x5 with Dr. Orvan Seen 4/26, post CABG CODE BLUE called due to low flow on Impella, CTS notified.  Patient underwent bedside exploratory postoperative open heart where Dr. Julien Girt reopened the chest incision and hematoma was removed, patient's hemodynamics improved upon opening sternotomy.  PCCM consulted morning of 4/28 due to difficulty weaning ventilator  Pertinent  Medical History  Hypertension, hyperlipidemia, type 2 diabetes, CVA, AAA, left foot amputation s/p fem pop bypass and heart failure   Significant Hospital Events: Including procedures, antibiotic start and stop dates in addition to other pertinent events   . 4/21 presented to the ED for evaluation of abnormal stress test . 4/22 left and right heart cath severe multivessel disease . 4/26 CABG x5 with hemodynamic instability post requiring brief CPR with eventual return back to the OR anastomosis bleeding was seen . 4/28 remains ventilator dependent, slowly weaning  PEEP . 5/1 remains on vent, diuresing, no ready to come off  . 5/3 started on CRRT. . 5/5 requiring sedation for vent  . 5/6 CT scan shows multiple areas of posterior circulation infarction. . 5/8 tolerating CRRT with fluid removal.  Decreasing vasopressor requirements. . 5/12 TCT today  Interim History / Subjective:   Patient on vent: PSV Levo 16 Dobutamine 2.5 CRRT running  Objective   Blood pressure 140/72, pulse 79, temperature 99.4 F (37.4 C), temperature source Oral, resp. rate 18, height '6\' 1"'$  (1.854 m), weight 112 kg, SpO2 100 %. CVP:  [6 mmHg-13 mmHg] 12 mmHg  Vent Mode: PSV;CPAP FiO2 (%):  [40 %] 40 % Set Rate:  [20 bmp] 20 bmp Vt Set:  [630 mL] 630 mL PEEP:  [5 cmH20] 5 cmH20 Pressure Support:  [10 [id number] cmH20] 10 cmH20 Plateau Pressure:  [15 cmH20-21 cmH20] 17 cmH20   Intake/Output Summary (Last 24 hours) at 08/10/2020 1034 Last data filed at 08/10/2020 1000 Gross per 24 hour  Intake 4653.69 ml  Output 4168 ml  Net 485.69 ml   Filed Weights   08/04/2020 0500 08/09/20 0500 08/10/20 0458  Weight: 114 kg 108 kg 112 kg    Examination: General: critically ill appearing male. NAD. On Vent HEENT: MM pink/moist; Mild scleral icterus Neuro: Not following verbal commands; flexion to painful stimuli; cough/gag in tact; corneal reflex intact CV: s1s2, no m/r/g, Impella in place PULM:  Coarse BS, Thick tracheal secretions, Vented: PS 5/5 40% GI: hard and slightly distended, bs hypoactive Extremities: warm/dry, 2+ LE edema, left metatarsal amputation Skin: no rashes, Sternum incision healing well   Labs/imaging that I have personally reviewed   ABG: Metabolic Alkalosis CO2 trending up 41 on 5/12 BUN 28 Creatinine 2.02 GFR  35 Chloride 86 Albumin 2 LDH 265 WBC 13.8 Hb 7.2  CoOx: 57.9  Trach aspirate 5/8: consistent with normal flora  EEG 5/11:  generalized periodic discharges with triphasic morphology at 1 Hz more commonaly seen in toxic-metabolic  encephalopathy. moderate diffuse encephalopathy  Resolved Hospital Problem list     Assessment & Plan:   Critically ill due to acute hypoxic respiratory failure  R pleural effusion, mild pulmonary edema  Plan: - Will start TCT as tolerated on 5/12 - Pulmonary Hygiene - Morning CXR to evaluate for R effusion - Consider stopping Meropenem: resp culture normal flora  Cardiogenic shock CAD sp CABG x5 - Continue Levo and Dobutamine for MAP >65 - Continue Impela per Cardiology - Trend CoOx/CVP   Acute Encephalopathy: multifactorial in setting of critical illness. Prolonged icu stay, and posterior circulation CVA - PO Dilaudid and prn fentanyl; consider weaning if interfering with progression - Neuro checks - Appreciate neurology input  AKI Oliguric - resume CRRT: keeping even - planning on stopping CRRT when circuit clots - App nephrology input - Trend BMP - prn bladder scan; in and out cath as needed  Colonic ileus - KUB ordered  Anemia, post op  Thrombocytopenia - stable  - Transfuse on unit on 5/12 - Trend CBC  AAA -supportive care  PAD s/p partial L foot amputation  Poorly controlled DM2 with microvascular complications.  -SSI and basal insulin -CBG monitoring    Best practice   Diet:  Tube Feed   Pain/Anxiety/Delirium protocol (if indicated): Yes (RASS goal 0)  VAP protocol (if indicated): Yes DVT prophylaxis: Systemic AC - Heparin in Impella purge. GI prophylaxis: PPI  Fiber added for diarrhea Glucose control:  SSI Yes - SSI + basal + q4  Central venous access:  Yes, and it is still needed, Arterial line:  Yes, and it is still needed  Foley:  N/A - removed now that on CRRT Mobility:  bed rest  PT consulted: N/A Last date of multidisciplinary goals of care discussion: I spoke with the patient's sons today 5/6 and informed them that he remains critically ill from a cardiovascular standpoint and that recovery is not guaranteed.  They also understand that  presence of cerebral infarction would likely indicate a poor overall outcome and that this a transition to comfort care may be appropriate at that time.  Code Status:  full code Disposition: ICU   CRITICAL CARE Performed by: Mick Sell   Total critical care time: 50 minutes  Critical care time was exclusive of separately billable procedures and treating other patients. Critical care was necessary to treat or prevent imminent or life-threatening deterioration.  Critical care was time spent personally by me on the following activities: development of treatment plan with patient and/or surrogate as well as nursing, discussions with consultants, evaluation of patient's response to treatment, examination of patient, obtaining history from patient or surrogate, ordering and performing treatments and interventions, ordering and review of laboratory studies, ordering and review of radiographic studies, pulse oximetry and re-evaluation of patient's condition.  JD Rexene Agent  Pulmonary & Critical Care 08/10/2020, 11:47 AM  Please see [email or web address] for pager details.  From 7A-7P if no response, please call [phone]. After hours, please call ELink [phone].

## 2020-08-10 NOTE — Progress Notes (Signed)
Patient ID: Danny Carpenter, male   DOB: [birth year], 70 y.o.   MRN: [id number]     Advanced Heart Failure Rounding Note  PCP-Cardiologist: None   Subjective:    - 4/26 S/P CABG - 5/4 Swan removed with ongoing fevers, CVVH started - CT head on 5/6 with multiple posterior circulation infarcts.  - 5/10 tracheostomy. CT head similar to prior with multiple small infarcts.   Remains intubated, eyes open but not following commands.    CVVH running even today. CVP 11.   He remains on NE 18, co-ox 58%.  He is on meropenem, Tm 101. Cultures negative.   Impella P4  Flow 2.3  No alarms Waveforms ok  LDH 209 => 247 => 215 => 247=>329=>350 => 477 => 370=>366=> 408 => 324 => 385=> 384 => 273 => 265 Co-ox 58%  Objective:   Weight Range: 112 kg Body mass index is 32.58 kg/m.   Vital Signs:   Temp:  [99 F (37.2 C)-102.4 F (39.1 C)] 99.4 F (37.4 C) (05/12 0735) Pulse Rate:  [73-95] 79 (05/12 0800) Resp:  [11-27] 20 (05/12 0800) BP: (115-147)/(49-110) 127/72 (05/12 0800) SpO2:  [0 %-100 %] 99 % (05/12 0800) Arterial Line BP: (88-148)/(36-58) 124/49 (05/12 0800) FiO2 (%):  [40 %] 40 % (05/12 0800) Weight:  [371 kg] 112 kg (05/12 0458) Last BM Date: 08/09/20  Weight change: Filed Weights   08/17/2020 0500 08/09/20 0500 08/10/20 0458  Weight: 114 kg 108 kg 112 kg    Intake/Output:   Intake/Output Summary (Last 24 hours) at 08/10/2020 0839 Last data filed at 08/10/2020 0803 Gross per 24 hour  Intake 4621.58 ml  Output 4050 ml  Net 571.58 ml      Physical Exam  CVP 11  General: NAD Neck: Thick, JVP 8-9 cm, no thyromegaly or thyroid nodule.  Lungs: Decreased BS at bases CV: Nondisplaced PMI.  Heart irregular S1/S2, no S3/S4, no murmur.  1+ edema to knees.  Abdomen: Soft, nontender, no hepatosplenomegaly, no distention.  Skin: Intact without lesions or rashes.  Neurologic: Eyes open, does not follow commands.  Extremities: No clubbing or cyanosis.  HEENT: Normal.     Telemetry   A fib 70s (personally reviewed)   Labs    CBC Recent Labs    08/09/20 0345 08/09/20 0413 08/10/20 0209 08/10/20 0333  WBC 17.0*  --   --  13.8*  HGB 7.5*   < > 8.2* 7.2*  HCT 24.9*   < > 24.0* 24.0*  MCV 95.0  --   --  95.2  PLT 277  --   --  296   < > = values in this interval not displayed.   Basic Metabolic Panel Recent Labs    08/09/20 0345 08/09/20 0413 08/09/20 1600 08/09/20 1643 08/10/20 0209 08/10/20 0333  NA 140   < > 141   < > 138 138  K 4.3   < > 4.2   < > 3.9 4.0  CL 86*   < > 86*   < > 85* 86*  CO2 40*  --  40*  --   --  41*  GLUCOSE 220*   < > 155*   < > 169* 161*  BUN 30*   < > 30*   < > 32* 28*  CREATININE 2.11*   < > 2.00*   < > 1.90* 2.02*  CALCIUM 9.1  --  9.1  --   --  8.9  MG 2.0  --   --   --   --  1.9  PHOS 3.6  --  2.9  --   --  3.0   < > = values in this interval not displayed.   Liver Function Tests Recent Labs    08/09/20 1600 08/10/20 0333  ALBUMIN 2.0* 2.0*   No results for input(s): LIPASE, AMYLASE in the last 72 hours. Cardiac Enzymes No results for input(s): CKTOTAL, CKMB, CKMBINDEX, TROPONINI in the last 72 hours.  BNP: BNP (last 3 results) Recent Labs    07/28/2020 1211  BNP 465.5*    ProBNP (last 3 results) No results for input(s): PROBNP in the last 8760 hours.   D-Dimer No results for input(s): DDIMER in the last 72 hours. Hemoglobin A1C No results for input(s): HGBA1C in the last 72 hours. Fasting Lipid Panel No results for input(s): CHOL, HDL, LDLCALC, TRIG, CHOLHDL, LDLDIRECT in the last 72 hours. Thyroid Function Tests No results for input(s): TSH, T4TOTAL, T3FREE, THYROIDAB in the last 72 hours.  Invalid input(s): FREET3  Other results:   Imaging    EEG adult  Result Date: 08/09/2020 Lora Havens, MD     08/09/2020 10:35 AM Patient Name: Danny Carpenter MRN: [id number] Epilepsy Attending: Lora Havens Referring Physician/Provider: Dr Donnetta Simpers Date: 08/09/2020  Duration: 23.10 mins Patient history: 70 year old male with altered mental status. EEG to evaluate for seizures. Level of alertness:  lethargic AEDs during EEG study: Clonazepam Technical aspects: This EEG study was done with scalp electrodes positioned according to the 10-20 International system of electrode placement. Electrical activity was acquired at a sampling rate of 500Hz  and reviewed with a high frequency filter of 70Hz  and a low frequency filter of 1Hz . EEG data were recorded continuously and digitally stored. Description: No clear posterior dominant rhythm was seen.  EEG showed continuous generalized 3 to 5 Hz theta-delta slowing.  Generalized periodic discharges with triphasic morphology at 1 Hz were also noted. Hyperventilation and photic stimulation were not performed.   ABNORMALITY - Continuous slow, generalized - Periodic discharges with triphasic morphology, generalized ( GPDs) IMPRESSION: This study showed generalized periodic discharges with triphasic morphology at 1 Hz which is on the ictal-interictal continuum.  However, the frequency and morphology is more commonly seen in toxic-metabolic encephalopathy. Additionally there is evidence of suggestive of moderate diffuse encephalopathy, nonspecific etiology. No seizures or definite epileptiform discharges were seen throughout the recording. [name]     Medications:     Scheduled Medications: . amiodarone  200 mg Per Tube Daily  . artificial tears  1 application Both Eyes D9I  . aspirin EC  325 mg Oral Daily   Or  . aspirin  324 mg Per Tube Daily  . B-complex with vitamin C  1 tablet Per Tube Daily  . chlorhexidine gluconate (MEDLINE KIT)  15 mL Mouth Rinse BID  . Chlorhexidine Gluconate Cloth  6 each Topical Daily  . clonazepam  0.25 mg Per Tube BID  . darbepoetin (ARANESP) injection - NON-DIALYSIS  150 mcg Subcutaneous Q Wed-1800  . docusate  200 mg Per Tube Daily  . feeding supplement (PROSource TF)  45 mL Per Tube 5  X Daily  . fiber  1 packet Per Tube BID  . HYDROmorphone HCl  2 mg Per Tube Q6H  . insulin aspart  10 Units Subcutaneous Q4H  . insulin aspart  3-9 Units Subcutaneous Q4H  . insulin detemir  29 Units Subcutaneous Q12H  . levalbuterol  0.63 mg Nebulization Q6H  . mouth rinse  15 mL Mouth Rinse 10  times per day  . metoCLOPramide (REGLAN) injection  5 mg Intravenous Q8H  . midodrine  5 mg Per Tube TID WC  . pantoprazole sodium  40 mg Per Tube Daily  . polyethylene glycol  17 g Per Tube Daily  . QUEtiapine  25 mg Per Tube BID  . rosuvastatin  40 mg Per Tube Daily  . sennosides  10 mL Per Tube QHS  . sodium chloride flush  10-40 mL Intracatheter Q12H    Infusions: .  prismasol BGK 4/2.5 500 mL/hr at 08/05/20 0800  .  prismasol BGK 4/2.5 300 mL/hr at 08/10/20 0359  . sodium chloride Stopped (08/01/20 1202)  . sodium chloride    . anidulafungin     Followed by  . [START ON 08/11/2020] anidulafungin    . calcium gluconate infusion for CRRT 20 g (08/09/20 2246)  . citrate dextrose 3,000 mL (08/10/20 0526)  . dexmedetomidine (PRECEDEX) IV infusion Stopped (08/09/20 1004)  . dextrose    . DOBUTamine 2.5 mcg/kg/min (08/10/20 0800)  . feeding supplement (VITAL 1.5 CAL) 1,000 mL (08/10/20 0214)  . fentaNYL infusion INTRAVENOUS Stopped (08/09/20 1939)  . heparin 1,650 Units/hr (08/10/20 0800)  . impella catheter heparin 50 unit/mL in dextrose 5% 10.5 mL/hr at 08/09/20 2227  . meropenem (MERREM) IV Stopped (08/10/20 0555)  . norepinephrine (LEVOPHED) Adult infusion 18 mcg/min (08/10/20 0800)  . prismasol BGK 4/2.5 1,500 mL/hr at 08/10/20 0730    PRN Medications: sodium chloride, Place/Maintain arterial line **AND** sodium chloride, acetaminophen (TYLENOL) oral liquid 160 mg/5 mL, dextrose, fentaNYL, fentaNYL (SUBLIMAZE) injection, heparin, metoprolol tartrate, ondansetron (ZOFRAN) IV, sodium chloride, sodium chloride flush, traMADol  Assessment/Plan   1. Cardiogenic shock: Ischemic  cardiomyopathy, post-CABG on 4/26.  He has Impella 5.5 in place, down to P4 with no alarms and stable LDH.  Limited echo 4/29 with EF< 20%, the RV appears normal in size with severe systolic dysfunction. Now on NE 18 and dobutamine 2.5. Echo with some improvement, EF in 30-35% range on 5/9 echo. Co-ox 58% today.  ?Component of septic/distributive shock with increased pressor requirement over the last couple days. CVP down to 11 with CVVH.  - Wean NE as BP allows (stable this morning). Continue dobutamine 2.5.  - Impella at P4, continue today.  Will wean and discontinue when NE down to around 5.  - Check Impella position by echo today.  Heparin gtt for Impella.  - Continue to run CVVH even today.  2. CAD: s/p CABG x 5 with LIMA-LAD, seq SVG-D1/ramus, seq SVG-PDA/PLV.   - ASA - Crestor 3. Anemia: Post-op bleeding, back to OR with multiple products given on 4/26 post-CABG.  Hgb 7.2  - Transfuse < 7.5 => 1 unit today.   4. Thrombocytopenia: Resolved. suspect low post-op/post-surgical bleeding and multiple blood products as well as sepsis.  5. PAD: Extensive history.  6. AAA: Monitoring as outpatient.  7. Type 2 DM: Insulin.  Hgb A1c was 11.6, poor control.  8. Atrial fibrillation: Rate controlled.   - Amiodarone to po today.    - heparin gtt . 9. Neuro: intubated and sedated, not following commands with sedation wean.  Head CT 5/6 with multiple posterior circulation infarcts. Head CT 5/10 no change.   - Palliative Care following.  10. ID:  PCT 7.5 on 4/29. Tm 101 last night. Cultures so far negative.  - D/c lines as able.  - Continue meropenem.  - Discussed fungal coverage with CCM, decided against this today.  11. Acute hypoxemic respiratory failure:  S/p tracheostomy on 5/10.  ?PNA, PCT 1.19 on 5/10.  - He is on meropenem.   12. AKI:  CVVH started with marked volume overload and worsening renal function.  Avoid hypotension and excessive CVVH rates. CVP 11.  - Run even today.   13. Ileus:  Seems to be resolved, tube feeds ongoing.   Continue sedation wean to see if he will awaken. Not ready to get Impella out yet, wait for lower pressor requirement.   CRITICAL CARE Performed by: Loralie Champagne  Total critical care time: 35 minutes  Critical care time was exclusive of separately billable procedures and treating other patients.  Critical care was necessary to treat or prevent imminent or life-threatening deterioration.  Critical care was time spent personally by me on the following activities: development of treatment plan with patient and/or surrogate as well as nursing, discussions with consultants, evaluation of patient's response to treatment, examination of patient, obtaining history from patient or surrogate, ordering and performing treatments and interventions, ordering and review of laboratory studies, ordering and review of radiographic studies, pulse oximetry and re-evaluation of patient's condition.  Loralie Champagne 08/10/2020 8:39 AM

## 2020-08-10 NOTE — Progress Notes (Signed)
EVENING ROUNDS NOTE :     [facility]       [place] [address]             [phone]                 16 Days Post-Op Procedure(s) (LRB): EXPLORATION POST OPERATIVE OPEN HEART (N/A)   Total Length of Stay:  LOS: 21 days  Events:   No changes    BP (!) 118/57   Pulse 76   Temp 99 F (37.2 C) (Oral)   Resp 20   Ht '6\' 1"'$  (1.854 m)   Wt 112 kg   SpO2 100%   BMI 32.58 kg/m   CVP:  [6 mmHg-19 mmHg] 12 mmHg  Vent Mode: PSV;CPAP FiO2 (%):  [40 %] 40 % Set Rate:  [20 bmp] 20 bmp Vt Set:  [630 mL] 630 mL PEEP:  [5 cmH20] 5 cmH20 Pressure Support:  [10 cmH20] 10 cmH20 Plateau Pressure:  [17 cmH20-21 cmH20] 17 cmH20  .  prismasol BGK 4/2.5 500 mL/hr at 08/05/20 0800  .  prismasol BGK 4/2.5 300 mL/hr at 08/10/20 0359  . sodium chloride Stopped (08/01/20 1202)  . sodium chloride    . calcium gluconate infusion for CRRT 20 g (08/09/20 2246)  . citrate dextrose 3,000 mL (08/10/20 0526)  . dextrose    . DOBUTamine 2.5 mcg/kg/min (08/10/20 1500)  . feeding supplement (VITAL 1.5 CAL) 1,000 mL (08/10/20 0214)  . heparin 1,650 Units/hr (08/10/20 1500)  . impella catheter heparin 50 unit/mL in dextrose 5% 10.5 mL/hr at 08/09/20 2227  . meropenem (MERREM) IV Stopped (08/10/20 1451)  . norepinephrine (LEVOPHED) Adult infusion 12 mcg/min (08/10/20 1500)  . prismasol BGK 4/2.5 1,500 mL/hr at 08/10/20 1426    I/O last 3 completed shifts: In: 6804.9 [I.V.:4204.3; Other:373.5; NG/GT:1709.8; IV Piggyback:517.2] Out: 7942 [Other:7792; Stool:150]   CBC Latest Ref Rng & Units 08/10/2020 08/10/2020 08/10/2020  WBC 4.0 - 10.5 K/uL - - -  Hemoglobin 13.0 - 17.0 g/dL 7.8(L) 8.8(L) 8.2(L)  Hematocrit 39.0 - 52.0 % 23.0(L) 26.0(L) 24.0(L)  Platelets 150 - 400 K/uL - - -    BMP Latest Ref Rng & Units 08/10/2020 08/10/2020 08/10/2020  Glucose 70 - 99 mg/dL 138(H) 179(H) -  BUN 8 - 23 mg/dL 26(H) 25(H) -  Creatinine 0.61 - 1.24 mg/dL 1.90(H) 1.50(H) -  Sodium 135 - 145  mmol/L 138 140 139  Potassium 3.5 - 5.1 mmol/L 3.7 3.6 3.6  Chloride 98 - 111 mmol/L 85(L) 83(L) -  CO2 22 - 32 mmol/L - - -  Calcium 8.9 - 10.3 mg/dL - - -    ABG    Component Value Date/Time   PHART 7.541 (H) 08/10/2020 0901   PCO2ART 56.2 (H) 08/10/2020 0901   PO2ART 106 08/10/2020 0901   HCO3 48.0 (H) 08/10/2020 0901   TCO2 41 (H) 08/10/2020 1023   ACIDBASEDEF 2.0 08/05/2020 0400   O2SAT 98.0 08/10/2020 0901       Melodie Bouillon, MD 08/10/2020 3:18 PM

## 2020-08-10 NOTE — Progress Notes (Signed)
Pt placed on PSV 10/5 per wean protocol. Pt is tolerating well at this time. RN aware. RT to continue to monitor.

## 2020-08-10 NOTE — Progress Notes (Signed)
Pt placed on 40% ATC per wean protocol. RN aware. RT to continue to monitor.

## 2020-08-10 NOTE — Progress Notes (Signed)
  Echocardiogram 2D Echocardiogram has been performed.  Danny Carpenter 08/10/2020, 10:03 AM

## 2020-08-11 ENCOUNTER — Inpatient Hospital Stay (HOSPITAL_COMMUNITY): Payer: No Typology Code available for payment source

## 2020-08-11 DIAGNOSIS — R609 Edema, unspecified: Secondary | ICD-10-CM

## 2020-08-11 DIAGNOSIS — N179 Acute kidney failure, unspecified: Secondary | ICD-10-CM | POA: Diagnosis not present

## 2020-08-11 DIAGNOSIS — R570 Cardiogenic shock: Secondary | ICD-10-CM | POA: Diagnosis not present

## 2020-08-11 DIAGNOSIS — I5023 Acute on chronic systolic (congestive) heart failure: Secondary | ICD-10-CM | POA: Diagnosis not present

## 2020-08-11 LAB — POCT I-STAT, CHEM 8
BUN: 23 mg/dL (ref 8–23)
BUN: 19 mg/dL (ref 8–23)
BUN: 23 mg/dL (ref 8–23)
Calcium, Ion: 0.33 mmol/L — CL (ref 1.15–1.40)
Calcium, Ion: 0.96 mmol/L — ABNORMAL LOW (ref 1.15–1.40)
Calcium, Ion: <0.3 mmol/L — CL (ref 1.15–1.40)
Chloride: 81 mmol/L — ABNORMAL LOW (ref 98–111)
Chloride: 84 mmol/L — ABNORMAL LOW (ref 98–111)
Chloride: 84 mmol/L — ABNORMAL LOW (ref 98–111)
Creatinine, Ser: 1.5 mg/dL — ABNORMAL HIGH (ref 0.61–1.24)
Creatinine, Ser: 1.3 mg/dL — ABNORMAL HIGH (ref 0.61–1.24)
Creatinine, Ser: 1.9 mg/dL — ABNORMAL HIGH (ref 0.61–1.24)
Glucose, Bld: 132 mg/dL — ABNORMAL HIGH (ref 70–99)
Glucose, Bld: 176 mg/dL — ABNORMAL HIGH (ref 70–99)
Glucose, Bld: 228 mg/dL — ABNORMAL HIGH (ref 70–99)
HCT: 28 % — ABNORMAL LOW (ref 39.0–52.0)
HCT: 26 % — ABNORMAL LOW (ref 39.0–52.0)
HCT: 31 % — ABNORMAL LOW (ref 39.0–52.0)
Hemoglobin: 9.5 g/dL — ABNORMAL LOW (ref 13.0–17.0)
Hemoglobin: 10.5 g/dL — ABNORMAL LOW (ref 13.0–17.0)
Hemoglobin: 8.8 g/dL — ABNORMAL LOW (ref 13.0–17.0)
Potassium: 3.9 mmol/L (ref 3.5–5.1)
Potassium: 3.8 mmol/L (ref 3.5–5.1)
Potassium: 3.9 mmol/L (ref 3.5–5.1)
Sodium: 139 mmol/L (ref 135–145)
Sodium: 138 mmol/L (ref 135–145)
Sodium: 139 mmol/L (ref 135–145)
TCO2: 39 mmol/L — ABNORMAL HIGH (ref 22–32)
TCO2: 36 mmol/L — ABNORMAL HIGH (ref 22–32)
TCO2: 43 mmol/L — ABNORMAL HIGH (ref 22–32)

## 2020-08-11 LAB — RENAL FUNCTION PANEL
Albumin: 2 g/dL — ABNORMAL LOW (ref 3.5–5.0)
Albumin: 2 g/dL — ABNORMAL LOW (ref 3.5–5.0)
Anion gap: 12 (ref 5–15)
Anion gap: 13 (ref 5–15)
BUN: 23 mg/dL (ref 8–23)
BUN: 34 mg/dL — ABNORMAL HIGH (ref 8–23)
CO2: 41 mmol/L — ABNORMAL HIGH (ref 22–32)
CO2: 41 mmol/L — ABNORMAL HIGH (ref 22–32)
Calcium: 9 mg/dL (ref 8.9–10.3)
Calcium: 8.5 mg/dL — ABNORMAL LOW (ref 8.9–10.3)
Chloride: 87 mmol/L — ABNORMAL LOW (ref 98–111)
Chloride: 85 mmol/L — ABNORMAL LOW (ref 98–111)
Creatinine, Ser: 1.94 mg/dL — ABNORMAL HIGH (ref 0.61–1.24)
Creatinine, Ser: 2.67 mg/dL — ABNORMAL HIGH (ref 0.61–1.24)
GFR, Estimated: 37 mL/min — ABNORMAL LOW (ref >60)
GFR, Estimated: 25 mL/min — ABNORMAL LOW (ref >60)
Glucose, Bld: 114 mg/dL — ABNORMAL HIGH (ref 70–99)
Glucose, Bld: 179 mg/dL — ABNORMAL HIGH (ref 70–99)
Phosphorus: 2.4 mg/dL — ABNORMAL LOW (ref 2.5–4.6)
Phosphorus: 3.4 mg/dL (ref 2.5–4.6)
Potassium: 3.9 mmol/L (ref 3.5–5.1)
Potassium: 3.4 mmol/L — ABNORMAL LOW (ref 3.5–5.1)
Sodium: 140 mmol/L (ref 135–145)
Sodium: 139 mmol/L (ref 135–145)

## 2020-08-11 LAB — POCT I-STAT 7, (LYTES, BLD GAS, ICA,H+H)
Acid-Base Excess: 27 mmol/L — ABNORMAL HIGH (ref 0.0–2.0)
Bicarbonate: 51.2 mmol/L — ABNORMAL HIGH (ref 20.0–28.0)
Calcium, Ion: 0.95 mmol/L — ABNORMAL LOW (ref 1.15–1.40)
HCT: 26 % — ABNORMAL LOW (ref 39.0–52.0)
Hemoglobin: 8.8 g/dL — ABNORMAL LOW (ref 13.0–17.0)
O2 Saturation: 99 %
Patient temperature: 100.9
Potassium: 3.8 mmol/L (ref 3.5–5.1)
Sodium: 138 mmol/L (ref 135–145)
TCO2: >50 mmol/L — ABNORMAL HIGH (ref 22–32)
pCO2 arterial: 52.8 mmHg — ABNORMAL HIGH (ref 32.0–48.0)
pH, Arterial: 7.598 — ABNORMAL HIGH (ref 7.350–7.450)
pO2, Arterial: 127 mmHg — ABNORMAL HIGH (ref 83.0–108.0)

## 2020-08-11 LAB — CBC
HCT: 27.2 % — ABNORMAL LOW (ref 39.0–52.0)
Hemoglobin: 8.1 g/dL — ABNORMAL LOW (ref 13.0–17.0)
MCH: 27.9 pg (ref 26.0–34.0)
MCHC: 29.8 g/dL — ABNORMAL LOW (ref 30.0–36.0)
MCV: 93.8 fL (ref 80.0–100.0)
Platelets: 311 10*3/uL (ref 150–400)
RBC: 2.9 MIL/uL — ABNORMAL LOW (ref 4.22–5.81)
RDW: 19.3 % — ABNORMAL HIGH (ref 11.5–15.5)
WBC: 12.5 10*3/uL — ABNORMAL HIGH (ref 4.0–10.5)
nRBC: 0.2 % (ref 0.0–0.2)

## 2020-08-11 LAB — MAGNESIUM
Magnesium: 1.9 mg/dL (ref 1.7–2.4)
Magnesium: 2.4 mg/dL (ref 1.7–2.4)

## 2020-08-11 LAB — GLUCOSE, CAPILLARY
Glucose-Capillary: 165 mg/dL — ABNORMAL HIGH (ref 70–99)
Glucose-Capillary: 184 mg/dL — ABNORMAL HIGH (ref 70–99)
Glucose-Capillary: 186 mg/dL — ABNORMAL HIGH (ref 70–99)
Glucose-Capillary: 186 mg/dL — ABNORMAL HIGH (ref 70–99)
Glucose-Capillary: 90 mg/dL (ref 70–99)
Glucose-Capillary: 90 mg/dL (ref 70–99)

## 2020-08-11 LAB — CALCIUM, IONIZED: Calcium, Ionized, Serum: 4.3 mg/dL — ABNORMAL LOW (ref 4.5–5.6)

## 2020-08-11 LAB — BASIC METABOLIC PANEL
Anion gap: 12 (ref 5–15)
BUN: 39 mg/dL — ABNORMAL HIGH (ref 8–23)
CO2: 39 mmol/L — ABNORMAL HIGH (ref 22–32)
Calcium: 8.2 mg/dL — ABNORMAL LOW (ref 8.9–10.3)
Chloride: 88 mmol/L — ABNORMAL LOW (ref 98–111)
Creatinine, Ser: 2.98 mg/dL — ABNORMAL HIGH (ref 0.61–1.24)
GFR, Estimated: 22 mL/min — ABNORMAL LOW (ref >60)
Glucose, Bld: 137 mg/dL — ABNORMAL HIGH (ref 70–99)
Potassium: 3.4 mmol/L — ABNORMAL LOW (ref 3.5–5.1)
Sodium: 139 mmol/L (ref 135–145)

## 2020-08-11 LAB — COOXEMETRY PANEL
Carboxyhemoglobin: 1.7 % — ABNORMAL HIGH (ref 0.5–1.5)
Methemoglobin: 1.1 % (ref 0.0–1.5)
O2 Saturation: 59.1 %
Total hemoglobin: 8.5 g/dL — ABNORMAL LOW (ref 12.0–16.0)

## 2020-08-11 LAB — LACTATE DEHYDROGENASE: LDH: 273 U/L — ABNORMAL HIGH (ref 98–192)

## 2020-08-11 LAB — HEPARIN LEVEL (UNFRACTIONATED): Heparin Unfractionated: 0.35 IU/mL (ref 0.30–0.70)

## 2020-08-11 MED ORDER — POTASSIUM CHLORIDE 10 MEQ/50ML IV SOLN
10.0000 meq | Freq: Once | INTRAVENOUS | Status: AC
  Administered 2020-08-11: 10 meq via INTRAVENOUS
  Filled 2020-08-11: qty 50

## 2020-08-11 MED ORDER — MAGNESIUM SULFATE 2 GM/50ML IV SOLN
2.0000 g | Freq: Once | INTRAVENOUS | Status: AC
  Administered 2020-08-11: 2 g via INTRAVENOUS
  Filled 2020-08-11: qty 50

## 2020-08-11 MED ORDER — AMIODARONE HCL IN DEXTROSE 360-4.14 MG/200ML-% IV SOLN
30.0000 mg/h | INTRAVENOUS | Status: DC
  Administered 2020-08-11 (×2): 30 mg/h via INTRAVENOUS
  Filled 2020-08-11 (×3): qty 200

## 2020-08-11 NOTE — Progress Notes (Signed)
Nephrology paged regarding evening Potassium of 3.4. Awaiting response.

## 2020-08-11 NOTE — Progress Notes (Signed)
SLP Cancellation Note  Patient Details Name: Danny Carpenter MRN: [id number] DOB: 1951/03/26   Cancelled treatment:       Reason Eval/Treat Not Completed: Patient not medically ready for inline PMV.  On full vent support after code Blue this am.  Will follow along for readiness.    Juan Quam Laurice 08/11/2020, 10:53 AM

## 2020-08-11 NOTE — Progress Notes (Signed)
PT Cancellation Note  Patient Details Name: Danny Carpenter MRN: [id number] DOB: [birth year]   Cancelled Treatment:    Reason Eval/Treat Not Completed: Patient not medically ready (pt remains with femoral CRRT access and not appropriate for mobility. Will sign off and await new order when medically appropriate)   [name] B [name] 08/11/2020, 7:07 AM  [place] Pager: [phone] Office: [phone]

## 2020-08-11 NOTE — Plan of Care (Signed)
  Problem: Elimination: Goal: Will not experience complications related to bowel motility Outcome: Progressing   Problem: Pain Managment: Goal: General experience of comfort will improve Outcome: Progressing   Problem: Safety: Goal: Ability to remain free from injury will improve Outcome: Progressing   Problem: Clinical Measurements: Goal: Postoperative complications will be avoided or minimized Outcome: Progressing

## 2020-08-11 NOTE — Progress Notes (Addendum)
Patient ID: Danny Carpenter, male   DOB: 25-Dec-1950, 70 y.o.   MRN: [id number]     Advanced Heart Failure Rounding Note  PCP-Cardiologist: None   Subjective:    - 4/26 S/P CABG - 5/4 Swan removed with ongoing fevers, CVVH started - CT head on 5/6 with multiple posterior circulation infarcts.  - 5/10 tracheostomy. CT head similar to prior with multiple small infarcts.   Remains on Vent through trach, eyes open but not following commands.    CVVH running even today. CVP 7-8   Hypotension overnight. NE titrated back up 4>>8>>now 10. On DBA 2.5. MAPs 67. Co-ox not drawn yet.   Remains on Impella at P-4, Flow 2.5    He is on meropenem. Now AF. WBC downtrending. Cultures negative.   KUB done yesterday>>dilated loops large bowel ? Obstruction vs ileus. TFs now on hold. Had BM yesterday after enema.   Impella P4  Flow 2.5 No alarms Waveforms ok  LDH 209 => 247 => 215 => 247=>329=>350 => 477 => 370=>366=> 408 => 324 => 385=> 384 => 273 => 265=>273  Co-ox pending  Position of Impella stable on 5/12 echo.   Objective:   Weight Range: 112.2 kg Body mass index is 32.63 kg/m.   Vital Signs:   Temp:  [98.8 F (37.1 C)-99.8 F (37.7 C)] 99.8 F (37.7 C) (05/12 1622) Pulse Rate:  [74-145] 90 (05/13 0630) Resp:  [15-25] 23 (05/13 0630) BP: (99-161)/(57-119) 99/67 (05/13 0600) SpO2:  [96 %-100 %] 100 % (05/13 0630) Arterial Line BP: (86-163)/(38-58) 112/53 (05/13 0630) FiO2 (%):  [40 %] 40 % (05/13 0434) Weight:  [112.2 kg] 112.2 kg (05/13 0500) Last BM Date: 08/11/20  Weight change: Filed Weights   08/09/20 0500 08/10/20 0458 08/11/20 0500  Weight: 108 kg 112 kg 112.2 kg    Intake/Output:   Intake/Output Summary (Last 24 hours) at 08/11/2020 0749 Last data filed at 08/11/2020 0700 Gross per 24 hour  Intake 5365.97 ml  Output 5867 ml  Net -501.03 ml      Physical Exam   PHYSICAL EXAM: CVP 7-8  General:  Critically ill, eyes open but not following commands. On vent  through trach HEENT: normal + TC Neck: supple. no JVD. Carotids 2+ bilat; no bruits. No lymphadenopathy or thyromegaly appreciated. Cor: PMI nondisplaced. Regular rate & rhythm. No rubs, gallops or murmurs. Lungs: clear Abdomen: soft, nontender, nondistended. No hepatosplenomegaly. No bruits or masses. Good bowel sounds. Extremities: no cyanosis, clubbing, rash, 1+ bilateral LE edema Neuro: awake but not following commands    Telemetry    A fib 70s (personally reviewed)   Labs    CBC Recent Labs    08/10/20 0333 08/10/20 0901 08/11/20 0337 08/11/20 0356  WBC 13.8*  --  12.5*  --   HGB 7.2*   < > 8.1* 8.8*  HCT 24.0*   < > 27.2* 26.0*  MCV 95.2  --  93.8  --   PLT 296  --  311  --    < > = values in this interval not displayed.   Basic Metabolic Panel Recent Labs    08/10/20 0333 08/10/20 0901 08/10/20 1759 08/10/20 1805 08/11/20 0337 08/11/20 0356  NA 138   < > 139 137 140 138  K 4.0   < > 4.2 4.1 3.9 3.8  CL 86*   < > 86* 84* 87*  --   CO2 41*  --  41*  --  41*  --   GLUCOSE 161*   < >  74 79 114*  --   BUN 28*   < > 25* 26* 23  --   CREATININE 2.02*   < > 1.84* 1.80* 1.94*  --   CALCIUM 8.9  --  8.9  --  9.0  --   MG 1.9  --   --   --  1.9  --   PHOS 3.0  --  2.9  --  2.4*  --    < > = values in this interval not displayed.   Liver Function Tests Recent Labs    08/10/20 1759 08/11/20 0337  ALBUMIN 2.1* 2.0*   No results for input(s): LIPASE, AMYLASE in the last 72 hours. Cardiac Enzymes No results for input(s): CKTOTAL, CKMB, CKMBINDEX, TROPONINI in the last 72 hours.  BNP: BNP (last 3 results) Recent Labs    07/22/2020 1211  BNP 465.5*    ProBNP (last 3 results) No results for input(s): PROBNP in the last 8760 hours.   D-Dimer No results for input(s): DDIMER in the last 72 hours. Hemoglobin A1C No results for input(s): HGBA1C in the last 72 hours. Fasting Lipid Panel No results for input(s): CHOL, HDL, LDLCALC, TRIG, CHOLHDL, LDLDIRECT  in the last 72 hours. Thyroid Function Tests No results for input(s): TSH, T4TOTAL, T3FREE, THYROIDAB in the last 72 hours.  Invalid input(s): FREET3  Other results:   Imaging    DG CHEST PORT 1 VIEW  Result Date: 08/10/2020 CLINICAL DATA:  Abdominal distension, pleural effusion. EXAM: PORTABLE CHEST 1 VIEW COMPARISON:  Chest radiograph dated 08/03/2020. FINDINGS: A tracheostomy tube terminates in the upper thoracic trachea. An Impella device is unchanged in position accounting for differences in projection. An enteric tube enters the stomach and terminates below the field of view. A left subclavian central venous catheter tip overlies the superior vena cava. Median sternotomy wires are redemonstrated. The heart is enlarged. Vascular calcifications are seen in the aortic arch. Mild bilateral interstitial opacities are decreased from prior exam. No pleural effusion or pneumothorax. IMPRESSION: Mild bilateral interstitial opacities are decreased in may represent pulmonary edema and/or pneumonia. No pleural effusion. Electronically Signed   By: Zerita Boers M.D.   On: 08/10/2020 14:15   DG Abd Portable 1V  Result Date: 08/10/2020 CLINICAL DATA:  Abdominal distension. EXAM: PORTABLE ABDOMEN - 1 VIEW COMPARISON:  Abdominal radiograph dated 08/02/2020. FINDINGS: Gas-filled, dilated, loops of large bowel are seen throughout the mid abdomen. Air-fluid levels and free intraperitoneal air cannot be excluded on the supine exam. An enteric tube terminates near the pylorus. Wires overlie the mid abdomen. IMPRESSION: Dilated loops of large bowel may represent obstruction versus ileus. Electronically Signed   By: Zerita Boers M.D.   On: 08/10/2020 14:18     Medications:     Scheduled Medications: . amiodarone  200 mg Per Tube Daily  . artificial tears  1 application Both Eyes H0Q  . aspirin EC  325 mg Oral Daily   Or  . aspirin  324 mg Per Tube Daily  . B-complex with vitamin C  1 tablet Per Tube  Daily  . chlorhexidine gluconate (MEDLINE KIT)  15 mL Mouth Rinse BID  . Chlorhexidine Gluconate Cloth  6 each Topical Daily  . darbepoetin (ARANESP) injection - NON-DIALYSIS  150 mcg Subcutaneous Q Wed-1800  . feeding supplement (PROSource TF)  45 mL Per Tube 5 X Daily  . fiber  1 packet Per Tube BID  . HYDROmorphone HCl  1 mg Oral Q6H   Followed by  . [  START ON 08/12/2020] HYDROmorphone HCl  0.5 mg Oral Q6H  . insulin aspart  3-9 Units Subcutaneous Q4H  . levalbuterol  0.63 mg Nebulization TID  . mouth rinse  15 mL Mouth Rinse 10 times per day  . metoCLOPramide  10 mg Per Tube TID AC  . midodrine  10 mg Per Tube TID WC  . pantoprazole sodium  40 mg Per Tube Daily  . rosuvastatin  40 mg Per Tube Daily  . sodium chloride flush  10-40 mL Intracatheter Q12H    Infusions: .  prismasol BGK 4/2.5 500 mL/hr at 08/05/20 0800  .  prismasol BGK 4/2.5 300 mL/hr at 08/10/20 2017  . sodium chloride Stopped (08/01/20 1202)  . sodium chloride    . calcium gluconate infusion for CRRT 60 mL/hr at 08/11/20 0700  . citrate dextrose 3,000 mL (08/10/20 0526)  . DOBUTamine 2.5 mcg/kg/min (08/11/20 0700)  . feeding supplement (VITAL 1.5 CAL) Stopped (08/10/20 1530)  . heparin 1,650 Units/hr (08/11/20 0700)  . impella catheter heparin 50 unit/mL in dextrose 5% 10.5 mL/hr at 08/09/20 2227  . meropenem (MERREM) IV Stopped (08/11/20 2263)  . norepinephrine (LEVOPHED) Adult infusion 8 mcg/min (08/11/20 0700)  . prismasol BGK 4/2.5 1,500 mL/hr at 08/11/20 0747    PRN Medications: sodium chloride, Place/Maintain arterial line **AND** sodium chloride, acetaminophen (TYLENOL) oral liquid 160 mg/5 mL, fentaNYL (SUBLIMAZE) injection, heparin, ondansetron (ZOFRAN) IV, sodium chloride, sodium chloride flush  Assessment/Plan   1. Cardiogenic shock: Ischemic cardiomyopathy, post-CABG on 4/26.  He has Impella 5.5 in place, down to P4 with no alarms and stable LDH.  Limited echo 4/29 with EF< 20%, the RV appears  normal in size with severe systolic dysfunction. Now on NE 10 and dobutamine 2.5. Echo with some improvement, EF in 30-35% range on 5/9 echo. Co-ox pending.  ?Component of septic/distributive shock with increased pressor requirement over the last couple days. CVP down to 7-8 with CVVH.  - Wean NE as BP allows. Continue dobutamine 2.5.  - Impella at P4. Will reduce to P-3   - Heparin gtt for Impella.  - Continue to run CVVH even today.  2. CAD: s/p CABG x 5 with LIMA-LAD, seq SVG-D1/ramus, seq SVG-PDA/PLV.   - ASA - Crestor 3. Anemia: Post-op bleeding, back to OR with multiple products given on 4/26 post-CABG.  Hgb 8.1  - Transfuse < 7.5    4. Thrombocytopenia: Resolved. suspect low post-op/post-surgical bleeding and multiple blood products as well as sepsis.  5. PAD: Extensive history.  6. AAA: Monitoring as outpatient.  7. Type 2 DM: Insulin.  Hgb A1c was 11.6, poor control.  8. Atrial fibrillation: Rate controlled.   - Amiodarone to po today.    - heparin gtt . 9. Neuro: intubated and sedated, not following commands with sedation wean.  Head CT 5/6 with multiple posterior circulation infarcts. Head CT 5/10 no change.   - Palliative Care following.  10. ID:  PCT 7.5 on 4/29. Now AF. Cultures so far negative.  - D/c lines as able.  - Continue meropenem.  - Discussed fungal coverage with CCM, decided against this today.  11. Acute hypoxemic respiratory failure: S/p tracheostomy on 5/10.  ?PNA, PCT 1.19 on 5/10.  - He is on meropenem.   12. AKI:  CVVH started with marked volume overload and worsening renal function.  Avoid hypotension and excessive CVVH rates. CVP 7-8 - Run even today.   13. Ileus: Seems to be improving. Had BM yesterday.    Danny  Ladoris Carpenter 08/11/2020 7:49 AM  Patient seen with PA, agree with the above note.   Currently on dobutamine 2.5 + NE 11.  Has not had co-ox this morning.  CVP 7-8, CVVH running even for the last day.  Remains on meropenem, afebrile.    Eyes open, does not follow commands.   General: NAD Neck: Trach, no thyromegaly or thyroid nodule.  Lungs: Decreased at bases.  CV: Nondisplaced PMI.  Heart irregular S1/S2, no S3/S4, no murmur.  1+ ankle edema. Abdomen: Soft, nontender, no hepatosplenomegaly, no distention.  Skin: Intact without lesions or rashes.  Neurologic: Eyes open, no response.  Extremities: No clubbing or cyanosis.  HEENT: Normal.   Still no purposeful response off IV sedation. Would give the weekend and then need to reassess goals of care.   Wean down NE this morning and decrease Impella to P3.  If he does ok with P3 and NE dose can come down some, can remove Impella (today or tomorrow, to reassess => discussed with Dr. Orvan Seen).  Heparin gtt ongoing with Impella and afib.   Continue meropenem for possible PNA/sepsis.   CRITICAL CARE Performed by: Loralie Champagne  Total critical care time: 35 minutes  Critical care time was exclusive of separately billable procedures and treating other patients.  Critical care was necessary to treat or prevent imminent or life-threatening deterioration.  Critical care was time spent personally by me on the following activities: development of treatment plan with patient and/or surrogate as well as nursing, discussions with consultants, evaluation of patient's response to treatment, examination of patient, obtaining history from patient or surrogate, ordering and performing treatments and interventions, ordering and review of laboratory studies, ordering and review of radiographic studies, pulse oximetry and re-evaluation of patient's condition.  Loralie Champagne 08/11/2020 8:25 AM

## 2020-08-11 NOTE — Procedures (Signed)
Central Venous Catheter Insertion Procedure Note  Izyk Goldfine  [id number]  1950-05-16  Date:08/11/20  Time:3:36 PM   Provider Performing:Jream Broyles D Rollene Rotunda   Procedure: Insertion of Non-tunneled Central Venous Catheter(36556)with US guidance [id number])    Indication(s) Hemodialysis  Consent Risks of the procedure as well as the alternatives and risks of each were explained to the patient and/or caregiver.  Consent for the procedure was obtained and is signed in the bedside chart  Anesthesia Topical only with 1% lidocaine   Timeout Verified patient identification, verified procedure, site/side was marked, verified correct patient position, special equipment/implants available, medications/allergies/relevant history reviewed, required imaging and test results available.  Sterile Technique Maximal sterile technique including full sterile barrier drape, hand hygiene, sterile gown, sterile gloves, mask, hair covering, sterile ultrasound probe cover (if used).  Procedure Description Area of catheter insertion was cleaned with chlorhexidine and draped in sterile fashion.   With real-time ultrasound guidance a HD catheter was placed into the right internal jugular vein.  Nonpulsatile blood flow and easy flushing noted in all ports.  The catheter was sutured in place and sterile dressing applied.  Complications/Tolerance None; patient tolerated the procedure well. Chest X-ray is ordered to verify placement for internal jugular or subclavian cannulation.  Chest x-ray is not ordered for femoral cannulation.  EBL Minimal  Specimen(s) None      JD Rexene Agent [facility] 08/11/2020, 3:37 PM  Please see [email or web address] for pager details.  From 7A-7P if no response, please call [phone]. After hours, please call ELink [phone].

## 2020-08-11 NOTE — Progress Notes (Signed)
Patient placed on full vent support due to code Blue.

## 2020-08-11 NOTE — Progress Notes (Addendum)
Danny Carpenter, MRN:  [id number], DOB:  05/15/1950, LOS: 64 ADMISSION DATE:  07/14/2020, CONSULTATION DATE: 07/28/2018 REFERRING MD: Dr. Orvan Seen, CHIEF COMPLAINT: Ventilator dependent  History of Present Illness:  Danny Carpenter is a 70 year old male with past medical history significant for hypertension, hyperlipidemia, type 2 diabetes, CVA, AAA, left foot amputation s/p fem pop bypass and heart failure who presented to the emergency department 4/21 after undergoing outpatient stress test that revealed EF of 20% and acute ischemia.  Since admission patient has been evaluated by cardiology and cardiothoracic surgery and patient underwent TEE and left heart cath.  LHC/RHC revealed severe three-vessel coronary artery disease with moderately elevated left heart and pulmonary artery pressures.  At that time patient was placed on goal-directed medical therapy for acute systolic congestive heart failure and CTS surgery was consulted for CABG potential.  Patient underwent CABG x5 with Dr. Orvan Seen 4/26, post CABG CODE BLUE called due to low flow on Impella, CTS notified.  Patient underwent bedside exploratory postoperative open heart where Dr. Julien Girt reopened the chest incision and hematoma was removed, patient's hemodynamics improved upon opening sternotomy.  PCCM consulted morning of 4/28 due to difficulty weaning ventilator  Pertinent  Medical History  Hypertension, hyperlipidemia, type 2 diabetes, CVA, AAA, left foot amputation s/p fem pop bypass and heart failure   Significant Hospital Events: Including procedures, antibiotic start and stop dates in addition to other pertinent events   . 4/21 presented to the ED for evaluation of abnormal stress test . 4/22 left and right heart cath severe multivessel disease . 4/26 CABG x5 with hemodynamic instability post requiring brief CPR with eventual return back to the OR anastomosis bleeding was seen . 4/28 remains ventilator dependent, slowly weaning  PEEP . 5/1 remains on vent, diuresing, no ready to come off  . 5/3 started on CRRT. . 5/5 requiring sedation for vent  . 5/6 CT scan shows multiple areas of posterior circulation infarction. . 5/8 tolerating CRRT with fluid removal.  Decreasing vasopressor requirements. . 5/12 TCT today . 5/13 VT event, self converted, febrile  Interim History / Subjective:   Remains critically ill.  Had a brief VT event this morning.  Self converted out of VT.  No shocks were given.  Objective   Blood pressure (!) 127/59, pulse 84, temperature (!) 101.48 F (38.6 C), resp. rate (!) 21, height '6\' 1"'$  (1.854 m), weight 112.2 kg, SpO2 96 %. CVP:  [4 mmHg-21 mmHg] 9 mmHg  Vent Mode: PRVC FiO2 (%):  [40 %] 40 % Set Rate:  [20 bmp] 20 bmp Vt Set:  [630 mL] 630 mL PEEP:  [5 cmH20] 5 cmH20 Pressure Support:  [8 cmH20] 8 cmH20 Plateau Pressure:  [21 cmH20-23 cmH20] 21 cmH20   Intake/Output Summary (Last 24 hours) at 08/11/2020 1332 Last data filed at 08/11/2020 1200 Gross per 24 hour  Intake 4313.1 ml  Output 5425 ml  Net -1111.9 ml   Filed Weights   08/09/20 0500 08/10/20 0458 08/11/20 0500  Weight: 108 kg 112 kg 112.2 kg    Examination: General: Morbidly obese, critically ill trached on life support HEENT: Membranes moist, trach in place, does not track Neuro: Does not follow commands, has flexion to painful stimuli cough and gag are present CV: Regular rate rhythm, S1-S2 PULM: Bilateral breath sounds, thick tracheal secretions GI: Mildly distended Extremities: Bilateral lower extremity edema, left forefoot amputation Skin: Healing sternal incision   Labs/imaging that I have personally reviewed    Trach aspirate 5/8: consistent  with normal flora  EEG 5/11:  generalized periodic discharges with triphasic morphology at 1 Hz more commonaly seen in toxic-metabolic encephalopathy. moderate diffuse encephalopathy  Labs from this morning reviewed. Sodium 138 Serum creatinine 1.9  Resolved  Hospital Problem list     Assessment & Plan:   Critically ill due to acute hypoxic respiratory failure  R pleural effusion, mild pulmonary edema  Plan: -Remains on full vent support - Trach in place - Continue routine trach care - Chest x-ray as needed. - Continue to wean from vent as tolerated.  Cardiogenic shock CAD sp CABG x5 -Remains persistent on Levophed - Continue Impella per cardiology and thoracic surgery - Trend coox  Acute Encephalopathy: multifactorial in setting of critical illness. Prolonged icu stay, and posterior circulation CVA -Dilaudid plus as needed fentanyl  Ongoing fevers - Plan to exchange HD catheter line from femoral location today. - If we can obtain peripherals would like to remove left subclavian - Both CVL's have been in greater than 10 days  AKI Oliguric Plan: CRRT holiday today. - Discussed with nephrology - Exchange catheter site. - We will need to hold heparin for replacement of HD catheter.  Colonic ileus -KUB completed- possible colonic ileus  Anemia, post op  Thrombocytopenia - stable  -Last transfusion on 512 - Trend CBC  AAA Supportive care  PAD s/p partial L foot amputation  Poorly controlled DM2 with microvascular complications.  -SSI plus basal insulin CBG monitoring  Goals of care: Appreciate input from cardiology services regarding recovery. I question whether or not we should get our palliative care team involved at some point.  Best practice   Diet:  Tube Feed   Pain/Anxiety/Delirium protocol (if indicated): Yes (RASS goal 0)  VAP protocol (if indicated): Yes DVT prophylaxis: Systemic AC - Heparin in Impella purge. GI prophylaxis: PPI  Fiber added for diarrhea Glucose control:  SSI Yes - SSI + basal + q4  Central venous access:  Yes, and it is still needed, Arterial line:  Yes, and it is still needed  Foley:  N/A - removed now that on CRRT Mobility:  bed rest  PT consulted: N/A Last date of  multidisciplinary goals of care discussion: on going  Code Status:  limited Disposition: ICU  This patient is critically ill with multiple organ system failure; which, requires frequent high complexity decision making, assessment, support, evaluation, and titration of therapies. This was completed through the application of advanced monitoring technologies and extensive interpretation of multiple databases. During this encounter critical care time was devoted to patient care services described in this note for 33 minutes.  Garner Nash, DO [facility] 08/11/2020 1:32 PM

## 2020-08-11 NOTE — Progress Notes (Signed)
Upon laying patient on back post bath patient went into sustained VTACH with brief loss of pulse. Code called. Patient spontaneously broke out of rhythm. Dr Aundra Dubin notified of event and ordered IV Amiodarone to start at '30mg'$ /hr.

## 2020-08-11 NOTE — Progress Notes (Signed)
Admit: 07/06/2020 LOS: 20  3M progressive AKI after CABG with prolonged shock (cardiogenic and ? Vasodilatory/septic), failure to achieve target volume status despite aggressive diuretics.    Current CRRT Prescription: Start Date: 08/01/20 Catheter: L Fem Temp IJ CCM placed 5/3 BFR: 300 Pre Blood Pump: 500 4K DFR: 1500 4K Replacement Rate: 300 4K Goal UF: keep even Anticoagulation: Citrate Clotting: None since transition to citrate  S: Stable on CRRT,   keeping even- given one unit of blood yest K 3.8, P 2.4  s/p trach 5/10 No change in neuro status-  eeg-  Toxic metabolic encephalopathy Weaning impalla    O: 05/12 0701 - 05/13 0700 In: 5366 [I.V.:2687.9; Blood:390; NG/GT:1337.5; IV Piggyback:300.1] Out: 5867 [Stool:1750]  PE Eyes open but no commands, restless in bed Lungs-  CBS bilat- vent CV- RRR- fresh sternotomy Abd-  Distended Ext-  Pitting edema to dep areas   Filed Weights   08/09/20 0500 08/10/20 0458 08/11/20 0500  Weight: 108 kg 112 kg 112.2 kg    Recent Labs  Lab 08/10/20 0333 08/10/20 0901 08/10/20 1759 08/10/20 1805 08/11/20 0337 08/11/20 0356  NA 138   < > 139 137 140 138  K 4.0   < > 4.2 4.1 3.9 3.8  CL 86*   < > 86* 84* 87*  --   CO2 41*  --  41*  --  41*  --   GLUCOSE 161*   < > 74 79 114*  --   BUN 28*   < > 25* 26* 23  --   CREATININE 2.02*   < > 1.84* 1.80* 1.94*  --   CALCIUM 8.9  --  8.9  --  9.0  --   PHOS 3.0  --  2.9  --  2.4*  --    < > = values in this interval not displayed.   Recent Labs  Lab 08/09/20 0345 08/09/20 0413 08/10/20 0333 08/10/20 0901 08/10/20 1805 08/11/20 0337 08/11/20 0356  WBC 17.0*  --  13.8*  --   --  12.5*  --   HGB 7.5*   < > 7.2*   < > 8.8* 8.1* 8.8*  HCT 24.9*   < > 24.0*   < > 26.0* 27.2* 26.0*  MCV 95.0  --  95.2  --   --  93.8  --   PLT 277  --  296  --   --  311  --    < > = values in this interval not displayed.    Scheduled Meds: . amiodarone  200 mg Per Tube Daily  . artificial  tears  1 application Both Eyes E7N  . aspirin EC  325 mg Oral Daily   Or  . aspirin  324 mg Per Tube Daily  . B-complex with vitamin C  1 tablet Per Tube Daily  . chlorhexidine gluconate (MEDLINE KIT)  15 mL Mouth Rinse BID  . Chlorhexidine Gluconate Cloth  6 each Topical Daily  . darbepoetin (ARANESP) injection - NON-DIALYSIS  150 mcg Subcutaneous Q Wed-1800  . feeding supplement (PROSource TF)  45 mL Per Tube 5 X Daily  . fiber  1 packet Per Tube BID  . HYDROmorphone HCl  1 mg Oral Q6H   Followed by  . [START ON 08/12/2020] HYDROmorphone HCl  0.5 mg Oral Q6H  . insulin aspart  3-9 Units Subcutaneous Q4H  . levalbuterol  0.63 mg Nebulization TID  . mouth rinse  15 mL Mouth Rinse 10 times per  day  . metoCLOPramide  10 mg Per Tube TID AC  . midodrine  10 mg Per Tube TID WC  . pantoprazole sodium  40 mg Per Tube Daily  . rosuvastatin  40 mg Per Tube Daily  . sodium chloride flush  10-40 mL Intracatheter Q12H   Continuous Infusions: .  prismasol BGK 4/2.5 500 mL/hr at 08/05/20 0800  .  prismasol BGK 4/2.5 300 mL/hr at 08/10/20 2017  . sodium chloride Stopped (08/01/20 1202)  . sodium chloride    . calcium gluconate infusion for CRRT 60 mL/hr at 08/11/20 0700  . citrate dextrose 3,000 mL (08/10/20 0526)  . DOBUTamine 2.5 mcg/kg/min (08/11/20 0700)  . feeding supplement (VITAL 1.5 CAL) Stopped (08/10/20 1530)  . heparin 1,650 Units/hr (08/11/20 0700)  . impella catheter heparin 50 unit/mL in dextrose 5% 10.5 mL/hr at 08/09/20 2227  . meropenem (MERREM) IV Stopped (08/11/20 4196)  . norepinephrine (LEVOPHED) Adult infusion 8 mcg/min (08/11/20 0700)  . prismasol BGK 4/2.5 1,500 mL/hr at 08/11/20 0747   PRN Meds:.sodium chloride, Place/Maintain arterial line **AND** sodium chloride, acetaminophen (TYLENOL) oral liquid 160 mg/5 mL, fentaNYL (SUBLIMAZE) injection, heparin, ondansetron (ZOFRAN) IV, sodium chloride, sodium chloride flush  ABG    Component Value Date/Time   PHART 7.598  (H) 08/11/2020 0356   PCO2ART 52.8 (H) 08/11/2020 0356   PO2ART 127 (H) 08/11/2020 0356   HCO3 51.2 (H) 08/11/2020 0356   TCO2 >50 (H) 08/11/2020 0356   ACIDBASEDEF 2.0 08/05/2020 0400   O2SAT 99.0 08/11/2020 0356    A/P  1. Dialysis dependent AKI, progressive, nonoliguric 2/2 cardiorenal syndrome probably some ATN.  vascath now in for 10 days 2. Acute systolic CHF / ICM; impella, off inotropes; massive vol overload; slowly improving-  CVP 11 yest-  Cards in room-  backed off on UF to keep even-  Still edematous but because is third spacing 3. CAD s/p  5V CABG 4/26 4. Anemia-  Have added ESA -  Transfuse PRN-  Given one unit on 5/12 5. DM2 6. PAD hx/o Fem Pop Bypass 7. Diuretic Resistance -  Volume status correcting with CRRT - now keeping even 8. Fevers,  9. VDRF 10. Hypophoshatemia on CRRT- replete PRN 11. Numerous ischemic bilateral posterior CVAs, neurology following 12. Hyperkalemia,- now all baths 4 K    Will try to give a CRRT holiday and see what UOP and labs do  ( will place foley) -  His baseline renal function albeit 6 weeks ago was a crt under 2.  May want to remove line for now.  There is a pretty significant chance we will need to resume CRRT in 48 hours or so -  At that time would need new line    agree with goals of care conversation, grim prognosis given his prolonged neurological insult.  Partial code-  But proceeding with peg and trach.   if he survives and remains RRT dep his only long term dispo option is [place], [place]  [employer]

## 2020-08-11 NOTE — Progress Notes (Signed)
Heparin paused per CCM for HD Catheter placement at 1430.

## 2020-08-11 NOTE — Progress Notes (Signed)
Nutrition Follow-up  DOCUMENTATION CODES:   Not applicable  INTERVENTION:   Tube Feeding via Cortrak:  Recommend trial of restarting trickle TF of Vital 1.5 at 20 ml/hr Goal rate: Vital 1.5 at 55 ml/hr Pro-source TF45 mL 5 times daily Provides 2180 kcals, 144 g of protein and 1003 mL of free water  Recommend holding nutrisource fiber for now given colonic ileus  Continue B-complex with C  Agree with pro motility agent at this time   NUTRITION DIAGNOSIS:   Inadequate oral intake related to acute illness as evidenced by NPO status.  GOAL:   Patient will meet greater than or equal to 90% of their needs  MONITOR:   Vent status,TF tolerance,Labs,Weight trends  REASON FOR ASSESSMENT:   Ventilator    ASSESSMENT:   70 yo male admitted with abnormal stress test with chest pain, SOB, elevated troponin with acute on chronic CHF. PMH includes HTN, HLD, DM, CVA, AAA, left food amputation, CHF  4/26 CABG x 5, brief CPR, Intubated 4/29 Cortrak inserted, gastric 5/03 CRRT initiated 5/06 CT head: multiple posterior circulation infarcts 5/10 Trach placed  Pt remains on vent support, remains on CRRT Remains on levophed, dobutamine. Increased requirements today after brief VT arrest  TF on hold since 5/12 secondary to colonic ileus  +stool with rectal tube in place. Pt received a SMOG enema yesterday with good results Noted order for nutrisource fiber BID, reglan TID  Abd xray this AM with distended bowel loops of large intestine  Labs: reviewed Meds: B complex with C, aranesp, nutrisource fiber, ss novolog, reglan    Diet Order:   Diet Order            Diet NPO time specified  Diet effective midnight                 EDUCATION NEEDS:   Not appropriate for education at this time  Skin:  Skin Assessment: Reviewed RN Assessment  Last BM:  5/9 rectal tube  Height:   Ht Readings from Last 1 Encounters:  08/03/20 '6\' 1"'$  (1.854 m)    Weight:   Wt Readings  from Last 1 Encounters:  08/11/20 112.2 kg    BMI:  Body mass index is 32.63 kg/m.  Estimated Nutritional Needs:   Kcal:  2100-2300 kcals  Protein:  130-150 g  Fluid:  >/= 2 L   Kerman Passey MS, RDN, LDN, CNSC Registered Dietitian III Clinical Nutrition RD Pager and On-Call Pager Number Located in [place]

## 2020-08-11 NOTE — Progress Notes (Signed)
Lower ext venous duplex  has been completed. Refer to [facility] under chart review to view preliminary results.   08/11/2020  1:26 PM Elion Hocker, Bonnye Fava

## 2020-08-11 NOTE — Progress Notes (Signed)
17 Days Post-Op Procedure(s) (LRB): EXPLORATION POST OPERATIVE OPEN HEART (N/A) Subjective: Intubated/sedated  Objective: Vital signs in last 24 hours: Temp:  [98.8 F (37.1 C)-99.8 F (37.7 C)] 99.8 F (37.7 C) (05/12 1622) Pulse Rate:  [74-145] 90 (05/13 0630) Cardiac Rhythm: Atrial fibrillation (05/12 2000) Resp:  [15-25] 23 (05/13 0630) BP: (99-161)/(57-119) 99/67 (05/13 0600) SpO2:  [96 %-100 %] 100 % (05/13 0630) Arterial Line BP: (86-163)/(38-58) 112/53 (05/13 0630) FiO2 (%):  [40 %] 40 % (05/13 0434) Weight:  [112.2 kg] 112.2 kg (05/13 0500)  Hemodynamic parameters for last 24 hours: CVP:  [4 mmHg-21 mmHg] 9 mmHg  Intake/Output from previous day: 05/12 0701 - 05/13 0700 In: 5366 [I.V.:2687.9; Blood:390; NG/GT:1337.5; IV Piggyback:300.1] Out: [id number] [Stool:1750] Intake/Output this shift: No intake/output data recorded.  General appearance: no distress Neurologic: cannot fully assess Heart: regular rate and rhythm, S1, S2 normal, no murmur, click, rub or gallop Lungs: rales bilaterally Abdomen: mildly distended, nontender Extremities: edema 1+ Wound: c/d/i  Lab Results: Recent Labs    08/10/20 0333 08/10/20 0901 08/11/20 0337 08/11/20 0356  WBC 13.8*  --  12.5*  --   HGB 7.2*   < > 8.1* 8.8*  HCT 24.0*   < > 27.2* 26.0*  PLT 296  --  311  --    < > = values in this interval not displayed.   BMET:  Recent Labs    08/10/20 1759 08/10/20 1805 08/11/20 0337 08/11/20 0356  NA 139 137 140 138  K 4.2 4.1 3.9 3.8  CL 86* 84* 87*  --   CO2 41*  --  41*  --   GLUCOSE 74 79 114*  --   BUN 25* 26* 23  --   CREATININE 1.84* 1.80* 1.94*  --   CALCIUM 8.9  --  9.0  --     PT/INR: No results for input(s): LABPROT, INR in the last 72 hours. ABG    Component Value Date/Time   PHART 7.598 (H) 08/11/2020 0356   HCO3 51.2 (H) 08/11/2020 0356   TCO2 >50 (H) 08/11/2020 0356   ACIDBASEDEF 2.0 08/05/2020 0400   O2SAT 99.0 08/11/2020 0356   CBG (last 3)  Recent  Labs    08/10/20 2045 08/11/20 0027 08/11/20 0348  GLUCAP 91 90 90    Assessment/Plan: S/P Procedure(s) (LRB): EXPLORATION POST OPERATIVE OPEN HEART (N/A) repeat KUB  Could consider removing impella in next day or so   LOS: 22 days    Wonda Olds 08/11/2020

## 2020-08-11 NOTE — Progress Notes (Signed)
   08/11/20 0958  Clinical Encounter Type  Visited With Health care provider  Visit Type Code  Referral From Nurse (Code Blue Page)  Consult/Referral To Chaplain  Chaplain responded to Code [facility] for Danny Carpenter.   Upon arrival Danny Carpenter was stable and code was clear.  Chaplain was not needed at this time.  Chaplain Sindy Mccune [place] [phone]

## 2020-08-11 NOTE — Progress Notes (Signed)
[place] for Heparin Indication: Impella 5.5  No Known Allergies  Patient Measurements: Height: '6\' 1"'$  (185.4 cm) Weight: 112.2 kg (247 lb 5.7 oz) IBW/kg (Calculated) : 79.9 Heparin Dosing Weight: 102.8 kg  Vital Signs: BP: 99/67 (05/13 0600) Pulse Rate: 90 (05/13 0630)  Labs: Recent Labs    08/09/20 0345 08/09/20 0413 08/10/20 0333 08/10/20 0901 08/10/20 1759 08/10/20 1805 08/11/20 0337 08/11/20 0338 08/11/20 0356  HGB 7.5*   < > 7.2*   < > 8.2* 8.8* 8.1*  --  8.8*  HCT 24.9*   < > 24.0*   < > 26.8* 26.0* 27.2*  --  26.0*  PLT 277  --  296  --   --   --  311  --   --   HEPARINUNFRC  --    < > 0.36  --  0.31  --   --  0.35  --   CREATININE 2.11*   < > 2.02*   < > 1.84* 1.80* 1.94*  --   --    < > = values in this interval not displayed.    Estimated Creatinine Clearance: 47.8 mL/min (A) (by C-G formula based on SCr of 1.94 mg/dL (H)).   Assessment: 70 yo M presents with NSTEMI and multivessel CAD, now s/p CABG with Impella support. Pharmacy asked to manage systemic heparin.  Rectal bleeding has been ongoing but is stable, bloody sputum has resolved. Heparin level remains therapeutic, CBC stable.  Goal of Therapy:  Heparin level 0.2-0.3 units/ml Monitor platelets by anticoagulation protocol: Yes   Plan:  Continue heparin purge solution (50 units/ml) Continue IV heparin at 1650 units/h Continue q12h coags for now   Danny Carpenter, PharmD, BCPS, [facility] Clinical Pharmacist [phone] Please check AMION for all [place] numbers 08/11/2020

## 2020-08-12 ENCOUNTER — Inpatient Hospital Stay (HOSPITAL_COMMUNITY): Payer: No Typology Code available for payment source

## 2020-08-12 DIAGNOSIS — I5023 Acute on chronic systolic (congestive) heart failure: Secondary | ICD-10-CM | POA: Diagnosis not present

## 2020-08-12 DIAGNOSIS — R4182 Altered mental status, unspecified: Secondary | ICD-10-CM | POA: Diagnosis not present

## 2020-08-12 DIAGNOSIS — I25700 Atherosclerosis of coronary artery bypass graft(s), unspecified, with unstable angina pectoris: Secondary | ICD-10-CM | POA: Diagnosis not present

## 2020-08-12 DIAGNOSIS — N179 Acute kidney failure, unspecified: Secondary | ICD-10-CM | POA: Diagnosis not present

## 2020-08-12 DIAGNOSIS — I6340 Cerebral infarction due to embolism of unspecified cerebral artery: Secondary | ICD-10-CM | POA: Insufficient documentation

## 2020-08-12 DIAGNOSIS — Z95811 Presence of heart assist device: Secondary | ICD-10-CM | POA: Diagnosis not present

## 2020-08-12 DIAGNOSIS — I214 Non-ST elevation (NSTEMI) myocardial infarction: Secondary | ICD-10-CM | POA: Diagnosis not present

## 2020-08-12 DIAGNOSIS — R570 Cardiogenic shock: Secondary | ICD-10-CM | POA: Diagnosis not present

## 2020-08-12 LAB — CBC
HCT: 24.2 % — ABNORMAL LOW (ref 39.0–52.0)
Hemoglobin: 7.4 g/dL — ABNORMAL LOW (ref 13.0–17.0)
MCH: 28.6 pg (ref 26.0–34.0)
MCHC: 30.6 g/dL (ref 30.0–36.0)
MCV: 93.4 fL (ref 80.0–100.0)
Platelets: 289 10*3/uL (ref 150–400)
RBC: 2.59 MIL/uL — ABNORMAL LOW (ref 4.22–5.81)
RDW: 19.1 % — ABNORMAL HIGH (ref 11.5–15.5)
WBC: 9.7 10*3/uL (ref 4.0–10.5)
nRBC: 0.2 % (ref 0.0–0.2)

## 2020-08-12 LAB — BASIC METABOLIC PANEL
Anion gap: 14 (ref 5–15)
Anion gap: 14 (ref 5–15)
BUN: 44 mg/dL — ABNORMAL HIGH (ref 8–23)
BUN: 53 mg/dL — ABNORMAL HIGH (ref 8–23)
CO2: 37 mmol/L — ABNORMAL HIGH (ref 22–32)
CO2: 35 mmol/L — ABNORMAL HIGH (ref 22–32)
Calcium: 8.1 mg/dL — ABNORMAL LOW (ref 8.9–10.3)
Calcium: 8.1 mg/dL — ABNORMAL LOW (ref 8.9–10.3)
Chloride: 89 mmol/L — ABNORMAL LOW (ref 98–111)
Chloride: 90 mmol/L — ABNORMAL LOW (ref 98–111)
Creatinine, Ser: 3.59 mg/dL — ABNORMAL HIGH (ref 0.61–1.24)
Creatinine, Ser: 3.94 mg/dL — ABNORMAL HIGH (ref 0.61–1.24)
GFR, Estimated: 18 mL/min — ABNORMAL LOW (ref >60)
GFR, Estimated: 16 mL/min — ABNORMAL LOW (ref >60)
Glucose, Bld: 111 mg/dL — ABNORMAL HIGH (ref 70–99)
Glucose, Bld: 225 mg/dL — ABNORMAL HIGH (ref 70–99)
Potassium: 3.5 mmol/L (ref 3.5–5.1)
Potassium: 3.8 mmol/L (ref 3.5–5.1)
Sodium: 140 mmol/L (ref 135–145)
Sodium: 139 mmol/L (ref 135–145)

## 2020-08-12 LAB — RENAL FUNCTION PANEL
Albumin: 1.9 g/dL — ABNORMAL LOW (ref 3.5–5.0)
Albumin: 2 g/dL — ABNORMAL LOW (ref 3.5–5.0)
Anion gap: 13 (ref 5–15)
Anion gap: 15 (ref 5–15)
BUN: 42 mg/dL — ABNORMAL HIGH (ref 8–23)
BUN: 52 mg/dL — ABNORMAL HIGH (ref 8–23)
CO2: 37 mmol/L — ABNORMAL HIGH (ref 22–32)
CO2: 34 mmol/L — ABNORMAL HIGH (ref 22–32)
Calcium: 8.3 mg/dL — ABNORMAL LOW (ref 8.9–10.3)
Calcium: 8.1 mg/dL — ABNORMAL LOW (ref 8.9–10.3)
Chloride: 87 mmol/L — ABNORMAL LOW (ref 98–111)
Chloride: 90 mmol/L — ABNORMAL LOW (ref 98–111)
Creatinine, Ser: 3.34 mg/dL — ABNORMAL HIGH (ref 0.61–1.24)
Creatinine, Ser: 3.88 mg/dL — ABNORMAL HIGH (ref 0.61–1.24)
GFR, Estimated: 19 mL/min — ABNORMAL LOW (ref >60)
GFR, Estimated: 16 mL/min — ABNORMAL LOW (ref >60)
Glucose, Bld: 186 mg/dL — ABNORMAL HIGH (ref 70–99)
Glucose, Bld: 224 mg/dL — ABNORMAL HIGH (ref 70–99)
Phosphorus: 3.2 mg/dL (ref 2.5–4.6)
Phosphorus: 5 mg/dL — ABNORMAL HIGH (ref 2.5–4.6)
Potassium: 3.2 mmol/L — ABNORMAL LOW (ref 3.5–5.1)
Potassium: 3.8 mmol/L (ref 3.5–5.1)
Sodium: 137 mmol/L (ref 135–145)
Sodium: 139 mmol/L (ref 135–145)

## 2020-08-12 LAB — CBC WITH DIFFERENTIAL/PLATELET
Abs Immature Granulocytes: 0.09 10*3/uL — ABNORMAL HIGH (ref 0.00–0.07)
Basophils Absolute: 0.1 10*3/uL (ref 0.0–0.1)
Basophils Relative: 1 %
Eosinophils Absolute: 0.5 10*3/uL (ref 0.0–0.5)
Eosinophils Relative: 5 %
HCT: 24.4 % — ABNORMAL LOW (ref 39.0–52.0)
Hemoglobin: 7.5 g/dL — ABNORMAL LOW (ref 13.0–17.0)
Immature Granulocytes: 1 %
Lymphocytes Relative: 13 %
Lymphs Abs: 1.4 10*3/uL (ref 0.7–4.0)
MCH: 28.3 pg (ref 26.0–34.0)
MCHC: 30.7 g/dL (ref 30.0–36.0)
MCV: 92.1 fL (ref 80.0–100.0)
Monocytes Absolute: 0.9 10*3/uL (ref 0.1–1.0)
Monocytes Relative: 8 %
Neutro Abs: 7.4 10*3/uL (ref 1.7–7.7)
Neutrophils Relative %: 72 %
Platelets: 306 10*3/uL (ref 150–400)
RBC: 2.65 MIL/uL — ABNORMAL LOW (ref 4.22–5.81)
RDW: 19.2 % — ABNORMAL HIGH (ref 11.5–15.5)
WBC: 10.2 10*3/uL (ref 4.0–10.5)
nRBC: 0.2 % (ref 0.0–0.2)

## 2020-08-12 LAB — COMPREHENSIVE METABOLIC PANEL
ALT: 60 U/L — ABNORMAL HIGH (ref 0–44)
AST: 81 U/L — ABNORMAL HIGH (ref 15–41)
Albumin: 2 g/dL — ABNORMAL LOW (ref 3.5–5.0)
Alkaline Phosphatase: 155 U/L — ABNORMAL HIGH (ref 38–126)
Anion gap: 15 (ref 5–15)
BUN: 53 mg/dL — ABNORMAL HIGH (ref 8–23)
CO2: 34 mmol/L — ABNORMAL HIGH (ref 22–32)
Calcium: 8 mg/dL — ABNORMAL LOW (ref 8.9–10.3)
Chloride: 89 mmol/L — ABNORMAL LOW (ref 98–111)
Creatinine, Ser: 4 mg/dL — ABNORMAL HIGH (ref 0.61–1.24)
GFR, Estimated: 16 mL/min — ABNORMAL LOW (ref >60)
Glucose, Bld: 225 mg/dL — ABNORMAL HIGH (ref 70–99)
Potassium: 3.8 mmol/L (ref 3.5–5.1)
Sodium: 138 mmol/L (ref 135–145)
Total Bilirubin: 1 mg/dL (ref 0.3–1.2)
Total Protein: 6 g/dL — ABNORMAL LOW (ref 6.5–8.1)

## 2020-08-12 LAB — GLUCOSE, CAPILLARY
Glucose-Capillary: 96 mg/dL (ref 70–99)
Glucose-Capillary: 157 mg/dL — ABNORMAL HIGH (ref 70–99)
Glucose-Capillary: 198 mg/dL — ABNORMAL HIGH (ref 70–99)
Glucose-Capillary: 152 mg/dL — ABNORMAL HIGH (ref 70–99)
Glucose-Capillary: 157 mg/dL — ABNORMAL HIGH (ref 70–99)

## 2020-08-12 LAB — HEPARIN LEVEL (UNFRACTIONATED)
Heparin Unfractionated: 0.27 IU/mL — ABNORMAL LOW (ref 0.30–0.70)
Heparin Unfractionated: 0.25 IU/mL — ABNORMAL LOW (ref 0.30–0.70)

## 2020-08-12 LAB — COOXEMETRY PANEL
Carboxyhemoglobin: 1.9 % — ABNORMAL HIGH (ref 0.5–1.5)
Methemoglobin: 0.9 % (ref 0.0–1.5)
O2 Saturation: 78.9 %
Total hemoglobin: 7.5 g/dL — ABNORMAL LOW (ref 12.0–16.0)

## 2020-08-12 LAB — MAGNESIUM
Magnesium: 2.3 mg/dL (ref 1.7–2.4)
Magnesium: 2.3 mg/dL (ref 1.7–2.4)

## 2020-08-12 LAB — PREPARE RBC (CROSSMATCH)

## 2020-08-12 LAB — CALCIUM, IONIZED: Calcium, Ionized, Serum: 4.3 mg/dL — ABNORMAL LOW (ref 4.5–5.6)

## 2020-08-12 LAB — LACTATE DEHYDROGENASE: LDH: 243 U/L — ABNORMAL HIGH (ref 98–192)

## 2020-08-12 MED ORDER — FUROSEMIDE 10 MG/ML IJ SOLN
160.0000 mg | Freq: Once | INTRAVENOUS | Status: AC
  Administered 2020-08-12: 160 mg via INTRAVENOUS
  Filled 2020-08-12: qty 16

## 2020-08-12 MED ORDER — LIDOCAINE HCL (CARDIAC) PF 100 MG/5ML IV SOSY
200.0000 mg | PREFILLED_SYRINGE | Freq: Once | INTRAVENOUS | Status: AC
  Administered 2020-08-12: 200 mg via INTRAVENOUS

## 2020-08-12 MED ORDER — SODIUM CHLORIDE 0.9% IV SOLUTION: Freq: Once | INTRAVENOUS | Status: AC

## 2020-08-12 MED ORDER — POTASSIUM CHLORIDE 10 MEQ/50ML IV SOLN
10.0000 meq | INTRAVENOUS | Status: AC
  Administered 2020-08-12 (×2): 10 meq via INTRAVENOUS
  Filled 2020-08-12: qty 50

## 2020-08-12 MED ORDER — MAGNESIUM SULFATE 50 % IJ SOLN
2.0000 g | Freq: Once | INTRAMUSCULAR | Status: DC
  Administered 2020-08-12: 2 g via INTRAVENOUS
  Filled 2020-08-12: qty 4

## 2020-08-12 MED ORDER — AMIODARONE LOAD VIA INFUSION
150.0000 mg | Freq: Once | INTRAVENOUS | Status: AC
  Filled 2020-08-12: qty 83.34

## 2020-08-12 MED ORDER — LIDOCAINE IN D5W 4-5 MG/ML-% IV SOLN
1.0000 mg/min | INTRAVENOUS | Status: DC
  Administered 2020-08-12 – 2020-08-13 (×2): 1 mg/min via INTRAVENOUS
  Filled 2020-08-12 (×2): qty 500

## 2020-08-12 MED ORDER — AMIODARONE HCL IN DEXTROSE 360-4.14 MG/200ML-% IV SOLN
60.0000 mg/h | INTRAVENOUS | Status: DC
  Administered 2020-08-12: 60 mg/h via INTRAVENOUS

## 2020-08-12 MED ORDER — AMIODARONE HCL IN DEXTROSE 360-4.14 MG/200ML-% IV SOLN
INTRAVENOUS | Status: AC
  Administered 2020-08-12: 150 mg via INTRAVENOUS
  Filled 2020-08-12: qty 200

## 2020-08-12 MED ORDER — AMIODARONE HCL 200 MG PO TABS: 200.0000 mg | ORAL_TABLET | Freq: Two times a day (BID) | ORAL | Status: DC

## 2020-08-12 MED ORDER — SODIUM CHLORIDE 0.9 % IV SOLN
1.0000 g | Freq: Two times a day (BID) | INTRAVENOUS | Status: AC
  Administered 2020-08-12 – 2020-08-14 (×5): 1 g via INTRAVENOUS
  Filled 2020-08-12 (×5): qty 1

## 2020-08-12 MED ORDER — AMIODARONE HCL 200 MG PO TABS
200.0000 mg | ORAL_TABLET | Freq: Two times a day (BID) | ORAL | Status: DC
  Administered 2020-08-12: 200 mg
  Filled 2020-08-12: qty 1

## 2020-08-12 MED ORDER — MAGNESIUM SULFATE 2 GM/50ML IV SOLN
2.0000 g | Freq: Once | INTRAVENOUS | Status: DC
  Filled 2020-08-12: qty 50

## 2020-08-12 MED ORDER — POTASSIUM CHLORIDE 10 MEQ/50ML IV SOLN
10.0000 meq | INTRAVENOUS | Status: AC
  Administered 2020-08-12 (×3): 10 meq via INTRAVENOUS
  Filled 2020-08-12 (×3): qty 50

## 2020-08-12 MED ORDER — AMIODARONE HCL IN DEXTROSE 360-4.14 MG/200ML-% IV SOLN: 30.0000 mg/h | INTRAVENOUS | Status: DC

## 2020-08-12 NOTE — Progress Notes (Addendum)
Interval hx  As I was doing my exam the patient went into refractory VTach. Code blue activated.       Vitals   Vitals:   08/12/20 1000 08/12/20 1100 08/12/20 1150 08/12/20 1200  BP: 131/81  (!) 132/55 124/66  Pulse: 71 73 73 70  Resp: 20 (!) 22 (!) 25 18  Temp: 99.86 F (37.7 C) 99.86 F (37.7 C)  99.86 F (37.7 C)  TempSrc:      SpO2: 99% 97% 98% 98%  Weight:      Height:         Body mass index is 32.63 kg/m.  Physical Exam   General: Laying comfortably in bed; trach in place. He is right handed  HENT: Normal oropharynx and mucosa. Normal external appearance of ears and nose. Neck: Supple, no pain or tenderness CV: No JVD. No peripheral edema. Pulmonary: Symmetric Chest rise. Breathing over Vent. Wound dressings in place, Abdomen: Soft to touch, non-tender. Ext: No cyanosis, edema, L foot amputation. Skin: No rash. Normal palpation of skin. Musculoskeletal: Normal digits and nails by inspection. No clubbing.  Neurologic Examination  Mental status/Cognition: eyes partially open when I approach him. Opens eyes a bit more when I yell out his name. No response to loud clap. Asymmetric grimace to nares stimulation with R upper and lower facial droop, does not close R eye. Vigorously moves head side to side with nares stimulation. Attempts to bring his LUE closer to his face with nares stimulation.  Brainstem and Cranial nerve reflexes: Corneals positive BL, weak on the right. Pupils 28m BL, round and reactive to light R Facial droop involving R upper and lower face.  Sensory/Motor: Tone: Increased extensor tone in BL upper extremities. Noted to have spontaneous movements in all extremities with a lot more spontaneous movement in LUE compared to RUE with spontaneous antigravity movement noted in LUE. He also seems to move his LLE more so than RLE but does have some spontaneous movement in the RLE.  No response to pinch in any of the extremities.  Reflexes:   Right Left Comments  Pectoralis      Biceps (C5/6) 2 2   Brachioradialis (C5/6) 2 2    Triceps (C6/7) 2 2    Patellar (L3/4) 2 2    Achilles (S1)      Hoffman      Plantar     Jaw jerk    Coordination/Complex Motor:  Unable to assess but no obvious tremors.  Labs   Basic Metabolic Panel:  Lab Results  Component Value Date   NA 140 08/12/2020   K 3.5 08/12/2020   CO2 37 (H) 08/12/2020   GLUCOSE 111 (H) 08/12/2020   BUN 44 (H) 08/12/2020   CREATININE 3.59 (H) 08/12/2020   CALCIUM 8.1 (L) 08/12/2020   GFRNONAA 18 (L) 08/12/2020   GFRAA >60 08/08/2019   HbA1c:  Lab Results  Component Value Date   HGBA1C 11.6 (H) 07/06/2020   LDL:  Lab Results  Component Value Date   LDLCALC 86 07/15/2020   Urine Drug Screen:     Component Value Date/Time   LABOPIA POSITIVE (A) 01/17/2019 1928   COCAINSCRNUR NONE DETECTED 01/17/2019 1928   LABBENZ NONE DETECTED 01/17/2019 1928   AMPHETMU NONE DETECTED 01/17/2019 1928   THCU NONE DETECTED 01/17/2019 1928   LABBARB NONE DETECTED 01/17/2019 1928    Alcohol Level No results found for: ETH No results found for: PHENYTOIN, ZONISAMIDE, LAMOTRIGINE, LEVETIRACETA No results found for: PHENYTOIN,  PHENOBARB, VALPROATE, CBMZ  Imaging and Diagnostic studies   CTH without contrast 08/04/20: Multiple posterior circulation infarcts within the bilateral cerebellum, right middle cerebellar peduncle, left pons, and cortical/subcortical right occipital lobe. These infarcts are new as compared to the head CT of 03/22/2019 and likely acute/early subacute. No evidence of hemorrhagic conversion. No significant mass effect at this time. Chronic right basal ganglia lacunar infarct, new from the prior CT. Mild generalized parenchymal atrophy. Paranasal sinus disease and bilateral mastoid effusions in the presence of life-support tubes.  [place] without contrast 08/06/2020: Multiple small infarcts again identified as above. No new intracranial  abnormalities.  EEG 08/09/20 Study showed generalized periodic discharges with triphasic morphology at 1 Hz which is on the ictal-interictal continuum.  However, the frequency and morphology is more commonly seen in toxic-metabolic encephalopathy. Additionally there is evidence of suggestive of moderate diffuse encephalopathy, nonspecific etiology. No seizures or definite epileptiform discharges were seen throughout the recording. Impression   Danny Carpenter is a 70 y.o. male who presented to the ED following stress test evidence of EF < 20% and with acute ischemia requiring a CABG x 5 for severe CAD identified on heart catheterization. Following procedure patient with multiple complications: low flow on Impella, hematoma impairing hemodynamics requiring bedside surgical exploration and prolonged encephalopathy and inability to wean from ventilator / requiring sedation to tolerate ETT.  CTH with multiple posterior circulation infarcts. Exam with concerns for R sided deficit and more spontaneous movement in the LUE and LLE compared to the Right and a R upper and lower facial droop.    I suspect that the R sided weakness might be due to the pontine stroke on CT head, thou its tiny and he does have some movement on the right. I do think that the likely explanation for the noted posterior circulation stroke is cardioembolic in the setting of complicated CABG.  Recommendations   - We will repeat EEG and make further recommendations.     ATTENDING NOTE: Pt was seen and examined.   70 year old male with history of diabetes, hyperlipidemia, hypertension, diabetes, PVD, CAD/MI, CHF, OSA and stroke admitted on 07/05/2020 for severe cardiomyopathy with EF less than 20% and severe CAD status post CABG, put on LVAD.  Postop with multiple complications including respiratory failure, renal failure, heart failure and encephalopathy.  Had trach and PEG on 5/10.  CT head on 5/6 and 5/10 showed multifocal ischemic  infarcts.  Not able to perform MRI.  EEG showed severe encephalopathy, no seizure.  On exam, eyes half way open, however, not following commands, nonverbal. Right gaze preference, not able to cross midline, tracking to the voice on the left, not consistently blinking to visual threat bilaterally, PERRL, weak corneal reflex bilaterally. No significant facial droop. Tongue protrusion not cooperative. On pain stimulation, able to have flexion of bicep on the right against gravity, no movement on the other limbs. Sensation, coordination not cooperative and gait not tested.  Etiology for patient embolic strokes likely due to severe cardiomyopathy, recent cardio procedure and an LVAD.  Not able to perform MRI due to LVAD, not able to perform CTA head and neck due to renal failure.  Currently on heparin IV and aspirin.  Recommend continue heparin IV and aspirin for stroke prevention.  Perform MRI and MRA when LVAD removed.  Of note, patient had V. tach with torsades this afternoon, reversed with CODE BLUE medications.  Currently CODE STATUS back to full code.  Neurology will follow up peripherally, please call us back  once LVAD removed and able to perform MRI.  Feel free to call with any further questions.  Rosalin Hawking, MD PhD Stroke Neurology 08/12/2020 7:07 PM

## 2020-08-12 NOTE — Progress Notes (Signed)
18 Days Post-Op Procedure(s) (LRB): EXPLORATION POST OPERATIVE OPEN HEART (N/A) Subjective: sedated  Objective: Vital signs in last 24 hours: Temp:  [99.3 F (37.4 C)-102.38 F (39.1 C)] 99.5 F (37.5 C) (05/14 0900) Pulse Rate:  [31-98] 70 (05/14 0900) Cardiac Rhythm: Atrial fibrillation (05/14 0800) Resp:  [9-24] 18 (05/14 0900) BP: (91-165)/(48-84) 146/65 (05/14 0900) SpO2:  [92 %-100 %] 99 % (05/14 0900) Arterial Line BP: (93-165)/(47-69) 121/55 (05/14 0900) FiO2 (%):  [40 %] 40 % (05/14 0754)  Hemodynamic parameters for last 24 hours: CVP:  [9 mmHg-23 mmHg] 10 mmHg  Intake/Output from previous day: 05/13 0701 - 05/14 0700 In: 2143.9 [I.V.:1023.4; NG/GT:420; IV Piggyback:454.9] Out: 2034 [Urine:1205; Stool:650] Intake/Output this shift: Total I/O In: 183.4 [I.V.:122; Other:21.2; IV Piggyback:40.2] Out: 70 [Urine:70]  General appearance: no distress Neurologic: followed commands yesterday Heart: regular rate and rhythm, S1, S2 normal, no murmur, click, rub or gallop Lungs: clear to auscultation bilaterally Abdomen: soft, non-tender; bowel sounds normal; no masses,  no organomegaly Extremities: edema 2+ Wound: c/d/i  Lab Results: Recent Labs    08/12/20 0252 08/12/20 0738  WBC 9.7 10.2  HGB 7.4* 7.5*  HCT 24.2* 24.4*  PLT 289 306   BMET:  Recent Labs    08/12/20 0252 08/12/20 0738  NA 137 140  K 3.2* 3.5  CL 87* 89*  CO2 37* 37*  GLUCOSE 186* 111*  BUN 42* 44*  CREATININE 3.34* 3.59*  CALCIUM 8.3* 8.1*    PT/INR: No results for input(s): LABPROT, INR in the last 72 hours. ABG    Component Value Date/Time   PHART 7.598 (H) 08/11/2020 0356   HCO3 51.2 (H) 08/11/2020 0356   TCO2 43 (H) 08/11/2020 0742   ACIDBASEDEF 2.0 08/05/2020 0400   O2SAT 78.9 08/12/2020 0252   CBG (last 3)  Recent Labs    08/11/20 1724 08/11/20 1932 08/12/20 0734  GLUCAP 186* 165* 96    Assessment/Plan: S/P Procedure(s) (LRB): EXPLORATION POST OPERATIVE OPEN  HEART (N/A) impella removal Monday   LOS: 23 days    Wonda Olds 08/12/2020

## 2020-08-12 NOTE — Progress Notes (Signed)
PHARMACY NOTE:  ANTIMICROBIAL RENAL DOSAGE ADJUSTMENT  Current antimicrobial regimen includes a mismatch between antimicrobial dosage and estimated renal function.  As per policy approved by the Pharmacy & Therapeutics and Medical Executive Committees, the antimicrobial dosage will be adjusted accordingly.  Current antimicrobial dosage:  Meropenem 1 gm IV q8 hrs  Indication: potential septic shock  Renal Function:  Estimated Creatinine Clearance: 25.8 mL/min (A) (by C-G formula based on SCr of 3.59 mg/dL (H)). '[]'$      On intermittent HD, scheduled: '[]'$      On CRRT    Antimicrobial dosage has been changed to:  Meropenem 1 gm IV q12 hrs  Additional comments: CRRT holiday started 5/13. Continuing to hold CRRT today, giving 1 dose of IV furosemide 160 mg. Will monitor renal function and any plans to restart CRRT.   Richardine Service, PharmD, BCPS PGY2 Cardiology Pharmacy Resident Phone: [phone] 08/12/2020  9:40 AM  Please check [email or web address] for unit-specific pharmacy phone numbers.

## 2020-08-12 NOTE — Progress Notes (Signed)
[place] for Heparin Indication: Impella 5.5  No Known Allergies  Patient Measurements: Height: '6\' 1"'$  (185.4 cm) Weight: 112.2 kg (247 lb 5.7 oz) IBW/kg (Calculated) : 79.9 Heparin Dosing Weight: 102.8 kg  Vital Signs: Temp: 101.12 F (38.4 C) (05/14 1800) Temp Source: Bladder (05/14 1500) BP: 130/79 (05/14 1800) Pulse Rate: 98 (05/14 1800)  Labs: Recent Labs    08/11/20 0337 08/11/20 0338 08/11/20 0356 08/11/20 0742 08/11/20 1722 08/12/20 0252 08/12/20 0738 08/12/20 1521  HGB 8.1*  --    < > 8.8*  --  7.4* 7.5*  --   HCT 27.2*  --    < > 26.0*  --  24.2* 24.4*  --   PLT 311  --   --   --   --  289 306  --   HEPARINUNFRC  --  0.35  --   --   --  0.27*  --  0.25*  CREATININE 1.94*  --    < > 1.90*   < > 3.34* 3.59* 4.00*  3.88*  3.94*   < > = values in this interval not displayed.    Estimated Creatinine Clearance: 23.9 mL/min (A) (by C-G formula based on SCr of 3.88 mg/dL (H)).   Assessment: 70 yo M presents with NSTEMI and multivessel CAD, now s/p CABG with Impella support. Pharmacy asked to manage systemic heparin.  Heparin was stopped for line exchange yesterday AM and he[arin resumed at lower rate 1600 units/hr. Heparin level therapeutic 0.25, CBC stable. Also receiving 550 u/hr heparin through purge 15m/hr and purge pressure stable about 500. Per discussion with RN, rectal bleeding resolved, no blood in sputum. Hgb stable 7.5.  Goal of Therapy:  Heparin level 0.2-0.3 units/ml Monitor platelets by anticoagulation protocol: Yes   Plan:  Continue heparin purge solution (50 units/ml) Continue IV heparin at 1600 units/h Continue q12h coags for now   LBed Bath & BeyondD. CPP, BCPS Clinical Pharmacist [phone]/14/2022 6:35 PM    Please check [email or web address] for unit-specific pharmacy phone numbers.

## 2020-08-12 NOTE — Progress Notes (Signed)
   08/12/20 1337  Clinical Encounter Type  Visited With Health care provider  Visit Type Code  Referral From Nurse  Consult/Referral To Chaplain   Chaplain responded to Code Blue. No current need for chaplain. Chaplain remains available.   This note was prepared by Chaplain Resident, Dante Gang, MDiv. Chaplain remains available as needed through the on-call pager: [phone].

## 2020-08-12 NOTE — Procedures (Addendum)
Patient Name: Danny Carpenter  MRN: [id number]  Epilepsy Attending: Lora Havens  Referring Physician/Provider: Claiborne Billings, PA Date: 08/12/2020 Duration: 26.45 mins  Patient history: 70 year old male with altered mental status. EEG to evaluate for seizures.  Level of alertness:  lethargic  AEDs during EEG study: None  Technical aspects: This EEG study was done with scalp electrodes positioned according to the 10-20 International system of electrode placement. Electrical activity was acquired at a sampling rate of '500Hz'$  and reviewed with a high frequency filter of '70Hz'$  and a low frequency filter of '1Hz'$ . EEG data were recorded continuously and digitally stored.   Description: No clear posterior dominant rhythm was seen.  EEG showed continuous generalized 3 to 5 Hz theta-delta slowing. Intermittent generalized periodic discharges with triphasic morphology at 1 Hz were also noted. Hyperventilation and photic stimulation were not performed.     ABNORMALITY - Continuous slow, generalized - Periodic discharges with triphasic morphology, generalized ( GPDs)  IMPRESSION: This study showed generalized periodic discharges with triphasic morphology at 1 Hz which is on the ictal-interictal continuum.  However, the frequency and morphology is more commonly seen in toxic-metabolic encephalopathy. Additionally there is evidence of suggestive of moderate diffuse encephalopathy, nonspecific etiology. No seizures or definite epileptiform discharges were seen throughout the recording.  Esau Fridman Barbra Sarks

## 2020-08-12 NOTE — Code Documentation (Signed)
  Patient Name: Danny Carpenter   MRN: [id number]   Date of Birth/ Sex: Aug 11, 1950 , male      Admission Date: 07/23/2020  Attending Provider: Wonda Olds, MD  Primary Diagnosis: <principal problem not specified>   Indication: Pt was in his usual state of health until this PM, when he was noted to be in torsades. Code blue was subsequently called. At the time of arrival on scene, noted patient is a partial code in sinus tachycardia.    Technical Description:  - CPR performance duration:  0 minutes  - Was defibrillation or cardioversion used? No   - Was external pacer placed? No  - Was patient intubated pre/post CPR? No   Medications Administered: Y = Yes; Blank = No Amiodarone    Atropine    Calcium    Epinephrine    Lidocaine    Magnesium    Norepinephrine    Phenylephrine    Sodium bicarbonate    Vasopressin     Post CPR evaluation:  - Final Status - Was patient successfully resuscitated ? Yes - What is current rhythm? Torsades to sinus tachycardia  - What is current hemodynamic status? Stable   Miscellaneous Information:  - Labs sent, including: none  - Primary team notified?  No  - Family Notified? No  - Additional notes/ transfer status: Patient is a partial code. Intermittently in and out of torsades multiple times today, spontaneously converted back to sinus tachycardia on arrival. Mg of 2.3.      Iona Beard, MD  08/12/2020, 4:10 PM

## 2020-08-12 NOTE — Progress Notes (Signed)
TCTS Progress Note  Responded to code blue which was related to Torsades. He received code drugs in alignment with current code status. Ultimately, he converted with A-V pacing. I talked with his daughter who has agreed to change his code status to "full" for now, as I explained that Torsades is most likely related to a treatable medical condition and that, overall, since Monday when limited code measures were instituted, his condition has improved.  The new order is placed on the chart for Full code. Danny Carpenter Z. Orvan Seen, [place]

## 2020-08-12 NOTE — Plan of Care (Signed)
  Problem: Clinical Measurements: Goal: Respiratory complications will improve Outcome: Progressing   Problem: Coping: Goal: Level of anxiety will decrease Outcome: Progressing   

## 2020-08-12 NOTE — Progress Notes (Addendum)
Patient began going in and out of Torsades/V-fib. Bensimhon, MD, called and made aware. Patient is a partial code with no CPR and no Defibrillation/cardioversion. Ordered to give '150mg'$  bolus of IV Amiodarone and start amiodarone gtt at '60mg'$ /hr.  Orvan Seen, MD, notified of situation. While on phone with Atkins, MD, patient became in a sustained rhythm of torsades and not breaking. Ordered to give '2mg'$  IV mag Sulfate amd 200gram IV Lidocaine. Atkins, MD, states he is on his way to bedside.  Family called and made aware of sitation.  Orvan Seen, MD, now at bedside.

## 2020-08-12 NOTE — Progress Notes (Signed)
EEG complete - results pending 

## 2020-08-12 NOTE — Progress Notes (Signed)
[facility] for Heparin Indication: Impella 5.5  No Known Allergies  Patient Measurements: Height: '6\' 1"'$  (185.4 cm) Weight: 112.2 kg (247 lb 5.7 oz) IBW/kg (Calculated) : 79.9 Heparin Dosing Weight: 102.8 kg  Vital Signs: Temp: 99.32 F (37.4 C) (05/14 0700) BP: 165/61 (05/14 0700) Pulse Rate: 31 (05/14 0700)  Labs: Recent Labs    08/10/20 1759 08/10/20 1805 08/11/20 0337 08/11/20 0338 08/11/20 0356 08/11/20 0742 08/11/20 1722 08/11/20 2209 08/12/20 0252 08/12/20 0738  HGB 8.2*   < > 8.1*  --    < > 8.8*  --   --  7.4* 7.5*  HCT 26.8*   < > 27.2*  --    < > 26.0*  --   --  24.2* 24.4*  PLT  --   --  311  --   --   --   --   --  289 306  HEPARINUNFRC 0.31  --   --  0.35  --   --   --   --  0.27*  --   CREATININE 1.84*   < > 1.94*  --    < > 1.90*   < > 2.98* 3.34* 3.59*   < > = values in this interval not displayed.    Estimated Creatinine Clearance: 25.8 mL/min (A) (by C-G formula based on SCr of 3.59 mg/dL (H)).   Assessment: 70 yo M presents with NSTEMI and multivessel CAD, now s/p CABG with Impella support. Pharmacy asked to manage systemic heparin.  Heparin was stopped for line exchange yesterday AM and resumed at lower rate 1600 units/hr. Resumed ~1730 last night. Heparin level therapeutic, CBC stable. Also receiving 530 u/hr heparin through purge. Per discussion with RN, rectal bleeding resolved, no blood in sputum. Hgb stable 7.5.  Goal of Therapy:  Heparin level 0.2-0.3 units/ml Monitor platelets by anticoagulation protocol: Yes   Plan:  Continue heparin purge solution (50 units/ml) Continue IV heparin at 1600 units/h Continue q12h coags for now   Richardine Service, PharmD, [facility] PGY2 Cardiology Pharmacy Resident Phone: [phone] 08/12/2020  9:26 AM  Please check [email or web address] for unit-specific pharmacy phone numbers.

## 2020-08-12 NOTE — Progress Notes (Signed)
[name], MRN:  [id number], DOB:  13-Nov-1950, LOS: 2 ADMISSION DATE:  07/19/2020, CONSULTATION DATE: 07/28/2018 REFERRING MD: Dr. Orvan Seen, CHIEF COMPLAINT: Ventilator dependent  History of Present Illness:  Danny Carpenter is a 70 year old male with past medical history significant for hypertension, hyperlipidemia, type 2 diabetes, CVA, AAA, left foot amputation s/p fem pop bypass and heart failure who presented to the emergency department 4/21 after undergoing outpatient stress test that revealed EF of 20% and acute ischemia.  Since admission patient has been evaluated by cardiology and cardiothoracic surgery and patient underwent TEE and left heart cath.  LHC/RHC revealed severe three-vessel coronary artery disease with moderately elevated left heart and pulmonary artery pressures.  At that time patient was placed on goal-directed medical therapy for acute systolic congestive heart failure and CTS surgery was consulted for CABG potential.  Patient underwent CABG x5 with Dr. Orvan Seen 4/26, post CABG CODE BLUE called due to low flow on Impella, CTS notified.  Patient underwent bedside exploratory postoperative open heart where Dr. Julien Girt reopened the chest incision and hematoma was removed, patient's hemodynamics improved upon opening sternotomy.  PCCM consulted morning of 4/28 due to difficulty weaning ventilator  Pertinent  Medical History  Hypertension, hyperlipidemia, type 2 diabetes, CVA, AAA, left foot amputation s/p fem pop bypass and heart failure   Significant Hospital Events: Including procedures, antibiotic start and stop dates in addition to other pertinent events   . 4/21 presented to the ED for evaluation of abnormal stress test . 4/22 left and right heart cath severe multivessel disease . 4/26 CABG x5 with hemodynamic instability post requiring brief CPR with eventual return back to the OR anastomosis bleeding was seen . 4/28 remains ventilator dependent, slowly weaning  PEEP . 5/1 remains on vent, diuresing, no ready to come off  . 5/3 started on CRRT. . 5/5 requiring sedation for vent  . 5/6 CT scan shows multiple areas of posterior circulation infarction. . 5/8 tolerating CRRT with fluid removal.  Decreasing vasopressor requirements. . 5/12 TCT today . 5/13 VT event, self converted, febrile  Interim History / Subjective:   Remains critically ill brief episode of VT yesterday.  Remains on mechanical vent support and Impella.  Objective   Blood pressure (!) 165/61, pulse (!) 31, temperature 99.32 F (37.4 C), resp. rate 18, height '6\' 1"'$  (1.854 m), weight 112.2 kg, SpO2 94 %. CVP:  [8 mmHg-23 mmHg] 23 mmHg  Vent Mode: PRVC FiO2 (%):  [40 %] 40 % Set Rate:  [20 bmp] 20 bmp Vt Set:  [630 mL] 630 mL PEEP:  [5 cmH20] 5 cmH20 Plateau Pressure:  [19 cmH20-22 cmH20] 22 cmH20   Intake/Output Summary (Last 24 hours) at 08/12/2020 [id number] Last data filed at 08/12/2020 0800 Gross per 24 hour  Intake 2016.09 ml  Output 1850 ml  Net 166.09 ml   Filed Weights   08/09/20 0500 08/10/20 0458 08/11/20 0500  Weight: 108 kg 112 kg 112.2 kg    Examination: General: Morbidly obese, critically ill trached on life support HEENT: Membranes moist, trach in place Neuro: Does not follow commands, will open eyes to voice, CV: Regular rhythm, S1-S2 PULM: Bilateral mechanically ventilated breath sounds, thick tracheal secretions present GI: Mildly distended Extremities: Bilateral lower extremity edema Skin: Central sternal wound healing   Labs/imaging that I have personally reviewed    Trach aspirate 5/8: consistent with normal flora  EEG 5/11:  generalized periodic discharges with triphasic morphology at 1 Hz more commonaly seen in toxic-metabolic encephalopathy.  moderate diffuse encephalopathy  Labs from this morning reviewed. Sodium 140 Creatinine 3.59 White blood cell count 2.2 Hemoglobin 7.5  Resolved Hospital Problem list     Assessment & Plan:    Critically ill due to acute hypoxic respiratory failure  R pleural effusion, mild pulmonary edema  Plan: Remains on full vent support Trach in place Routine trach care Chest x-ray as needed Wean off wean from vent, wean PEEP and FiO2 as tolerated.  Cardiogenic shock CAD sp CABG x5 Plan: Remains on Levophed Impella  Acute Encephalopathy: multifactorial in setting of critical illness. Prolonged icu stay, and posterior circulation CVA Dilaudid scheduled, as needed fentanyl  Ongoing fevers - HD catheter exchanged yesterday Would like to remove left subclavian however the catheter site does look clear No additional fevers overnight. Remains on meropenem per thoracic surgery  AKI Oliguric Plan: Remains on CRRT holiday Exchanged catheter site yesterday I suspect will need to restart CVVHD due to increased volume.  Colonic ileus KUB with possible colonic ileus Tolerating tube feeds at this time continue to observe  Anemia, post op  Thrombocytopenia - stable  Trend CBC  AAA Supportive care  PAD s/p partial L foot amputation  Poorly controlled DM2 with microvascular complications.  CBGs with SSI  Goals of care: [facility] cardiology service input. At some point I think we should consider involving palliative care  Best practice   Diet:  Tube Feed   Pain/Anxiety/Delirium protocol (if indicated): Yes (RASS goal 0)  VAP protocol (if indicated): Yes DVT prophylaxis: Systemic AC - Heparin in Impella purge. GI prophylaxis: PPI  Fiber added for diarrhea Glucose control:  SSI Yes - SSI + basal + q4  Central venous access:  Yes, and it is still needed, Arterial line:  Yes, and it is still needed  Foley:  N/A - removed now that on CRRT Mobility:  bed rest  PT consulted: N/A Last date of multidisciplinary goals of care discussion: on going  Code Status:  limited Disposition: ICU  This patient is critically ill with multiple organ system failure; which, requires  frequent high complexity decision making, assessment, support, evaluation, and titration of therapies. This was completed through the application of advanced monitoring technologies and extensive interpretation of multiple databases. During this encounter critical care time was devoted to patient care services described in this note for 32 minutes.  Garner Nash, DO [facility] 08/12/2020 9:07 AM
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
## 2020-08-12 NOTE — Progress Notes (Signed)
Admit: 07/27/2020 LOS: 23  71M progressive AKI after CABG with prolonged shock (cardiogenic and ? Vasodilatory/septic), failure to achieve target volume status despite aggressive diuretics.    Catheter: right IJ Temp IJ CCM placed 5/14  S: Stopped CRRT yesterday for holiday-  Had 1100 of UOP, ended up pretty even -  Seeing BUN and crt rise however-  Appreciate CCM placing new HD line s/p trach 5/10 No change in neuro status-  eeg-  Toxic metabolic encephalopathy Weaning impalla  Ectopy-  Replacing K gently    O: 05/13 0701 - 05/14 0700 In: 2005.6 [I.V.:981.8; NG/GT:420; IV Piggyback:368.8] Out: 2014 [Urine:1185; Stool:650]  PE Eyes open but no commands, restless in bed Lungs-  CBS bilat- vent CV- RRR- fresh sternotomy Abd-  Distended Ext-  Pitting edema to dep areas   Filed Weights   08/09/20 0500 08/10/20 0458 08/11/20 0500  Weight: 108 kg 112 kg 112.2 kg    Recent Labs  Lab 08/11/20 0337 08/11/20 0356 08/11/20 1722 08/11/20 2209 08/12/20 0252  NA 140   < > 139 139 137  K 3.9   < > 3.4* 3.4* 3.2*  CL 87*   < > 85* 88* 87*  CO2 41*  --  41* 39* 37*  GLUCOSE 114*   < > 179* 137* 186*  BUN 23   < > 34* 39* 42*  CREATININE 1.94*   < > 2.67* 2.98* 3.34*  CALCIUM 9.0  --  8.5* 8.2* 8.3*  PHOS 2.4*  --  3.4  --  3.2   < > = values in this interval not displayed.   Recent Labs  Lab 08/10/20 0333 08/10/20 0901 08/11/20 0337 08/11/20 0356 08/11/20 0736 08/11/20 0742 08/12/20 0252  WBC 13.8*  --  12.5*  --   --   --  9.7  HGB 7.2*   < > 8.1*   < > 10.5* 8.8* 7.4*  HCT 24.0*   < > 27.2*   < > 31.0* 26.0* 24.2*  MCV 95.2  --  93.8  --   --   --  93.4  PLT 296  --  311  --   --   --  289   < > = values in this interval not displayed.    Scheduled Meds: . artificial tears  1 application Both Eyes O9B  . aspirin EC  325 mg Oral Daily   Or  . aspirin  324 mg Per Tube Daily  . B-complex with vitamin C  1 tablet Per Tube Daily  . chlorhexidine gluconate (MEDLINE  KIT)  15 mL Mouth Rinse BID  . Chlorhexidine Gluconate Cloth  6 each Topical Daily  . darbepoetin (ARANESP) injection - NON-DIALYSIS  150 mcg Subcutaneous Q Wed-1800  . feeding supplement (PROSource TF)  45 mL Per Tube 5 X Daily  . fiber  1 packet Per Tube BID  . HYDROmorphone HCl  0.5 mg Oral Q6H  . insulin aspart  3-9 Units Subcutaneous Q4H  . levalbuterol  0.63 mg Nebulization TID  . mouth rinse  15 mL Mouth Rinse 10 times per day  . metoCLOPramide  10 mg Per Tube TID AC  . midodrine  10 mg Per Tube TID WC  . pantoprazole sodium  40 mg Per Tube Daily  . rosuvastatin  40 mg Per Tube Daily  . sodium chloride flush  10-40 mL Intracatheter Q12H   Continuous Infusions: . sodium chloride Stopped (08/01/20 1202)  . sodium chloride    . amiodarone  30 mg/hr (08/12/20 0601)  . calcium gluconate infusion for CRRT Stopped (08/11/20 0900)  . citrate dextrose 3,000 mL (08/10/20 0526)  . DOBUTamine 2.5 mcg/kg/min (08/12/20 0601)  . feeding supplement (VITAL 1.5 CAL) Stopped (08/10/20 1530)  . heparin 1,600 Units/hr (08/12/20 0601)  . impella catheter heparin 50 unit/mL in dextrose 5% 10.5 mL/hr at 08/09/20 2227  . meropenem (MERREM) IV 1 g (08/12/20 9767)  . norepinephrine (LEVOPHED) Adult infusion 6 mcg/min (08/12/20 0601)  . prismasol BGK 4/2.5 1,500 mL/hr at 08/11/20 0747   PRN Meds:.sodium chloride, Place/Maintain arterial line **AND** sodium chloride, acetaminophen (TYLENOL) oral liquid 160 mg/5 mL, fentaNYL (SUBLIMAZE) injection, heparin, ondansetron (ZOFRAN) IV, sodium chloride, sodium chloride flush  ABG    Component Value Date/Time   PHART 7.598 (H) 08/11/2020 0356   PCO2ART 52.8 (H) 08/11/2020 0356   PO2ART 127 (H) 08/11/2020 0356   HCO3 51.2 (H) 08/11/2020 0356   TCO2 43 (H) 08/11/2020 0742   ACIDBASEDEF 2.0 08/05/2020 0400   O2SAT 78.9 08/12/2020 0252    A/P  1. Dialysis dependent AKI, progressive, nonoliguric 2/2 cardiorenal syndrome probably some ATN.  Required CRRT from  5/3 to 5/13, currently on holiday.  Is nonoliguric but BUN and crt rising.  I think we can continue to hold CRRT for the next 24 hours 2. Acute systolic CHF / ICM; impella but weaning,  Currently on dobut and norepi- slowly improving per cards-  CVP 13-16 yest-  -  Still edematous but because is third spacing 3. CAD s/p  5V CABG 4/26 4. Anemia-  Have added ESA -  Transfuse PRN-  Given one unit on 5/12 5. DM2 6. PAD hx/o Fem Pop Bypass 7. Diuretic Resistance -  Volume status correcting with CRRT - now keeping even-  Managed to stay even with I's and O's last 24 hours, no lasix needed but I think would respond to it  8. Fevers,  9. VDRF 10. Hypophoshatemia on CRRT- replete PRN 11. Numerous ischemic bilateral posterior CVAs, neurology following 12. Hyperkalemia,- now hypo-  I am OK with gentle repletion    giving a CRRT holiday and see what UOP and labs do  ( will place foley) -  His baseline renal function albeit 6 weeks ago was a crt under 2.    There is a pretty significant chance we will need to resume CRRT in 24 hours or so -  CCM changed out line to fresh yesterday    agree with goals of care conversation, grim prognosis given his prolonged neurological insult.  Partial code-  But proceeding with peg and trach.   if he survives and remains RRT dep his only long term dispo option is [place], [place]  [employer]

## 2020-08-13 DIAGNOSIS — R570 Cardiogenic shock: Secondary | ICD-10-CM | POA: Diagnosis not present

## 2020-08-13 DIAGNOSIS — Z95811 Presence of heart assist device: Secondary | ICD-10-CM | POA: Diagnosis not present

## 2020-08-13 DIAGNOSIS — I5023 Acute on chronic systolic (congestive) heart failure: Secondary | ICD-10-CM | POA: Diagnosis not present

## 2020-08-13 DIAGNOSIS — I472 Ventricular tachycardia: Secondary | ICD-10-CM

## 2020-08-13 LAB — COOXEMETRY PANEL
Carboxyhemoglobin: 1.6 % — ABNORMAL HIGH (ref 0.5–1.5)
Methemoglobin: 1.1 % (ref 0.0–1.5)
O2 Saturation: 70 %
Total hemoglobin: 9.3 g/dL — ABNORMAL LOW (ref 12.0–16.0)

## 2020-08-13 LAB — RENAL FUNCTION PANEL
Albumin: 2 g/dL — ABNORMAL LOW (ref 3.5–5.0)
Anion gap: 17 — ABNORMAL HIGH (ref 5–15)
BUN: 62 mg/dL — ABNORMAL HIGH (ref 8–23)
CO2: 33 mmol/L — ABNORMAL HIGH (ref 22–32)
Calcium: 8.3 mg/dL — ABNORMAL LOW (ref 8.9–10.3)
Chloride: 90 mmol/L — ABNORMAL LOW (ref 98–111)
Creatinine, Ser: 4.33 mg/dL — ABNORMAL HIGH (ref 0.61–1.24)
GFR, Estimated: 14 mL/min — ABNORMAL LOW (ref >60)
Glucose, Bld: 125 mg/dL — ABNORMAL HIGH (ref 70–99)
Phosphorus: 5 mg/dL — ABNORMAL HIGH (ref 2.5–4.6)
Potassium: 3.3 mmol/L — ABNORMAL LOW (ref 3.5–5.1)
Sodium: 140 mmol/L (ref 135–145)

## 2020-08-13 LAB — TYPE AND SCREEN
ABO/RH(D): A POS
Antibody Screen: NEGATIVE
Unit division: 0
Unit division: 0
Unit division: 0

## 2020-08-13 LAB — CBC
HCT: 28.7 % — ABNORMAL LOW (ref 39.0–52.0)
Hemoglobin: 9.2 g/dL — ABNORMAL LOW (ref 13.0–17.0)
MCH: 29.1 pg (ref 26.0–34.0)
MCHC: 32.1 g/dL (ref 30.0–36.0)
MCV: 90.8 fL (ref 80.0–100.0)
Platelets: 289 10*3/uL (ref 150–400)
RBC: 3.16 MIL/uL — ABNORMAL LOW (ref 4.22–5.81)
RDW: 18.3 % — ABNORMAL HIGH (ref 11.5–15.5)
WBC: 9.2 10*3/uL (ref 4.0–10.5)
nRBC: 0.2 % (ref 0.0–0.2)

## 2020-08-13 LAB — BASIC METABOLIC PANEL
Anion gap: 17 — ABNORMAL HIGH (ref 5–15)
BUN: 69 mg/dL — ABNORMAL HIGH (ref 8–23)
CO2: 29 mmol/L (ref 22–32)
Calcium: 7.9 mg/dL — ABNORMAL LOW (ref 8.9–10.3)
Chloride: 92 mmol/L — ABNORMAL LOW (ref 98–111)
Creatinine, Ser: 4.87 mg/dL — ABNORMAL HIGH (ref 0.61–1.24)
GFR, Estimated: 12 mL/min — ABNORMAL LOW (ref >60)
Glucose, Bld: 95 mg/dL (ref 70–99)
Potassium: 3.3 mmol/L — ABNORMAL LOW (ref 3.5–5.1)
Sodium: 138 mmol/L (ref 135–145)

## 2020-08-13 LAB — HEPARIN LEVEL (UNFRACTIONATED)
Heparin Unfractionated: 0.33 IU/mL (ref 0.30–0.70)
Heparin Unfractionated: 0.41 IU/mL (ref 0.30–0.70)

## 2020-08-13 LAB — CBC WITH DIFFERENTIAL/PLATELET
Abs Immature Granulocytes: 0.1 10*3/uL — ABNORMAL HIGH (ref 0.00–0.07)
Basophils Absolute: 0.1 10*3/uL (ref 0.0–0.1)
Basophils Relative: 1 %
Eosinophils Absolute: 0.5 10*3/uL (ref 0.0–0.5)
Eosinophils Relative: 6 %
HCT: 28.9 % — ABNORMAL LOW (ref 39.0–52.0)
Hemoglobin: 9.2 g/dL — ABNORMAL LOW (ref 13.0–17.0)
Immature Granulocytes: 1 %
Lymphocytes Relative: 14 %
Lymphs Abs: 1.2 10*3/uL (ref 0.7–4.0)
MCH: 28.5 pg (ref 26.0–34.0)
MCHC: 31.8 g/dL (ref 30.0–36.0)
MCV: 89.5 fL (ref 80.0–100.0)
Monocytes Absolute: 0.9 10*3/uL (ref 0.1–1.0)
Monocytes Relative: 10 %
Neutro Abs: 5.9 10*3/uL (ref 1.7–7.7)
Neutrophils Relative %: 68 %
Platelets: 303 10*3/uL (ref 150–400)
RBC: 3.23 MIL/uL — ABNORMAL LOW (ref 4.22–5.81)
RDW: 18.6 % — ABNORMAL HIGH (ref 11.5–15.5)
WBC: 8.6 10*3/uL (ref 4.0–10.5)
nRBC: 0 % (ref 0.0–0.2)

## 2020-08-13 LAB — BPAM RBC
Blood Product Expiration Date: 202205212359
Blood Product Expiration Date: 202205212359
Blood Product Expiration Date: 202205302359
ISSUE DATE / TIME: 202205121329
ISSUE DATE / TIME: 202205141442
ISSUE DATE / TIME: 202205141442
Unit Type and Rh: 6200
Unit Type and Rh: 6200
Unit Type and Rh: 6200

## 2020-08-13 LAB — GLUCOSE, CAPILLARY
Glucose-Capillary: 117 mg/dL — ABNORMAL HIGH (ref 70–99)
Glucose-Capillary: 190 mg/dL — ABNORMAL HIGH (ref 70–99)
Glucose-Capillary: 182 mg/dL — ABNORMAL HIGH (ref 70–99)
Glucose-Capillary: 103 mg/dL — ABNORMAL HIGH (ref 70–99)
Glucose-Capillary: 153 mg/dL — ABNORMAL HIGH (ref 70–99)
Glucose-Capillary: 111 mg/dL — ABNORMAL HIGH (ref 70–99)

## 2020-08-13 LAB — CALCIUM, IONIZED: Calcium, Ionized, Serum: 4.4 mg/dL — ABNORMAL LOW (ref 4.5–5.6)

## 2020-08-13 LAB — MAGNESIUM
Magnesium: 2.8 mg/dL — ABNORMAL HIGH (ref 1.7–2.4)
Magnesium: 2.5 mg/dL — ABNORMAL HIGH (ref 1.7–2.4)

## 2020-08-13 LAB — LACTATE DEHYDROGENASE: LDH: 271 U/L — ABNORMAL HIGH (ref 98–192)

## 2020-08-13 MED ORDER — FENTANYL 2500MCG IN NS 250ML (10MCG/ML) PREMIX INFUSION
25.0000 ug/h | INTRAVENOUS | Status: DC
  Administered 2020-08-13 – 2020-08-14 (×2): 50 ug/h via INTRAVENOUS
  Administered 2020-08-16: 150 ug/h via INTRAVENOUS
  Administered 2020-08-16 (×2): 100 ug/h via INTRAVENOUS
  Administered 2020-08-16: 150 ug/h via INTRAVENOUS
  Filled 2020-08-13 (×5): qty 250

## 2020-08-13 MED ORDER — POTASSIUM CHLORIDE 20 MEQ PO PACK
20.0000 meq | PACK | Freq: Once | ORAL | Status: AC
  Administered 2020-08-13: 20 meq
  Filled 2020-08-13: qty 1

## 2020-08-13 MED ORDER — GERHARDT'S BUTT CREAM
TOPICAL_CREAM | Freq: Four times a day (QID) | CUTANEOUS | Status: DC
  Administered 2020-08-13 – 2020-08-23 (×7): 1 via TOPICAL
  Filled 2020-08-13 (×2): qty 1

## 2020-08-13 MED ORDER — FE FUMARATE-B12-VIT C-FA-IFC PO CAPS
1.0000 | ORAL_CAPSULE | Freq: Two times a day (BID) | ORAL | Status: DC
  Administered 2020-08-13 – 2020-08-22 (×17): 1 via ORAL
  Filled 2020-08-13 (×19): qty 1

## 2020-08-13 MED ORDER — INSULIN DETEMIR 100 UNIT/ML ~~LOC~~ SOLN
29.0000 [IU] | Freq: Two times a day (BID) | SUBCUTANEOUS | Status: DC
  Administered 2020-08-13 – 2020-08-16 (×7): 29 [IU] via SUBCUTANEOUS
  Filled 2020-08-13 (×9): qty 0.29

## 2020-08-13 MED ORDER — POTASSIUM CHLORIDE 2 MEQ/ML IV SOLN: 40.0000 meq | Freq: Once | INTRAVENOUS | Status: DC

## 2020-08-13 MED ORDER — POTASSIUM CHLORIDE 10 MEQ/50ML IV SOLN
10.0000 meq | INTRAVENOUS | Status: AC
  Administered 2020-08-13 (×2): 10 meq via INTRAVENOUS
  Filled 2020-08-13: qty 50

## 2020-08-13 MED ORDER — POLYVINYL ALCOHOL 1.4 % OP SOLN
1.0000 [drp] | OPHTHALMIC | Status: DC | PRN
  Administered 2020-08-13 – 2020-08-15 (×4): 1 [drp] via OPHTHALMIC
  Filled 2020-08-13: qty 15

## 2020-08-13 NOTE — Progress Notes (Signed)
19 Days Post-Op Procedure(s) (LRB): EXPLORATION POST OPERATIVE OPEN HEART (N/A) Subjective: sedated  Objective: Vital signs in last 24 hours: Temp:  [98.8 F (37.1 C)-102.74 F (39.3 C)] 99 F (37.2 C) (05/15 0700) Pulse Rate:  [35-109] 98 (05/15 0817) Cardiac Rhythm: A-V Sequential paced (05/15 0400) Resp:  [16-30] 25 (05/15 0817) BP: (100-168)/(55-146) 115/71 (05/15 0817) SpO2:  [87 %-100 %] 100 % (05/15 0817) Arterial Line BP: (47-147)/(44-85) 122/77 (05/15 0700) FiO2 (%):  [40 %-100 %] 50 % (05/15 0817)  Hemodynamic parameters for last 24 hours: CVP:  [6 mmHg-20 mmHg] 8 mmHg  Intake/Output from previous day: 05/14 0701 - 05/15 0700 In: 2631.1 [I.V.:1023.6; Blood:742.5; NG/GT:150; IV Piggyback:456.1] Out: 2995 [Urine:2695; Emesis/NG output:100; Stool:200] Intake/Output this shift: No intake/output data recorded.  General appearance: no distress Neurologic: unable to fully assess Heart: irregularly irregular rhythm Lungs: clear to auscultation bilaterally Abdomen: mildly distended Extremities: edema 1+ Wound: c/d/i  Lab Results: Recent Labs    08/12/20 0738 08/13/20 0306  WBC 10.2 9.2  HGB 7.5* 9.2*  HCT 24.4* 28.7*  PLT 306 289   BMET:  Recent Labs    08/12/20 1521 08/13/20 0326  NA 138  139  139 140  K 3.8  3.8  3.8 3.3*  CL 89*  90*  90* 90*  CO2 34*  34*  35* 33*  GLUCOSE 225*  224*  225* 125*  BUN 53*  52*  53* 62*  CREATININE 4.00*  3.88*  3.94* 4.33*  CALCIUM 8.0*  8.1*  8.1* 8.3*    PT/INR: No results for input(s): LABPROT, INR in the last 72 hours. ABG    Component Value Date/Time   PHART 7.598 (H) 08/11/2020 0356   HCO3 51.2 (H) 08/11/2020 0356   TCO2 43 (H) 08/11/2020 0742   ACIDBASEDEF 2.0 08/05/2020 0400   O2SAT 70.0 08/13/2020 0306   CBG (last 3)  Recent Labs    08/12/20 2326 08/13/20 0338 08/13/20 0741  GLUCAP 157* 117* 190*    Assessment/Plan: S/P Procedure(s) (LRB): EXPLORATION POST OPERATIVE OPEN  HEART (N/A) restart TFs  Continue impella support Full code Continue Dobut/norepi   LOS: 24 days    Wonda [facility] 08/13/2020

## 2020-08-13 NOTE — Progress Notes (Signed)
NAMEOrell Palomares, MRN:  [id number], DOB:  12/02/1950, LOS: 24 ADMISSION DATE:  07/19/2020, CONSULTATION DATE: 07/28/2018 REFERRING MD: Dr. Orvan Seen, CHIEF COMPLAINT: Ventilator dependent  History of Present Illness:  Salvator Muhl is a 70 year old male with past medical history significant for hypertension, hyperlipidemia, type 2 diabetes, CVA, AAA, left foot amputation s/p fem pop bypass and heart failure who presented to the emergency department 4/21 after undergoing outpatient stress test that revealed EF of 20% and acute ischemia.  Since admission patient has been evaluated by cardiology and cardiothoracic surgery and patient underwent TEE and left heart cath.  LHC/RHC revealed severe three-vessel coronary artery disease with moderately elevated left heart and pulmonary artery pressures.  At that time patient was placed on goal-directed medical therapy for acute systolic congestive heart failure and CTS surgery was consulted for CABG potential.  Patient underwent CABG x5 with Dr. Orvan Seen 4/26, post CABG CODE BLUE called due to low flow on Impella, CTS notified.  Patient underwent bedside exploratory postoperative open heart where Dr. Julien Girt reopened the chest incision and hematoma was removed, patient's hemodynamics improved upon opening sternotomy.  PCCM consulted morning of 4/28 due to difficulty weaning ventilator  Pertinent  Medical History  Hypertension, hyperlipidemia, type 2 diabetes, CVA, AAA, left foot amputation s/p fem pop bypass and heart failure   Significant Hospital Events: Including procedures, antibiotic start and stop dates in addition to other pertinent events   . 4/21 presented to the ED for evaluation of abnormal stress test . 4/22 left and right heart cath severe multivessel disease . 4/26 CABG x5 with hemodynamic instability post requiring brief CPR with eventual return back to the OR anastomosis bleeding was seen . 4/28 remains ventilator dependent, slowly weaning  PEEP . 5/1 remains on vent, diuresing, no ready to come off  . 5/3 started on CRRT. . 5/5 requiring sedation for vent  . 5/6 CT scan shows multiple areas of posterior circulation infarction. . 5/8 tolerating CRRT with fluid removal.  Decreasing vasopressor requirements. . 5/12 TCT today . 5/13 VT event, self converted, febrile . 5/14 spent ~30 mins in Torsades until paced out, code status reversed by Dr. Julien Girt, patient spent 30 mins with no pulsatility and no flow except for the impella.   Interim History / Subjective:   Again with an episode of VT yesterday. Was paced out. Code status reversed by Dr. Julien Girt   Objective   Blood pressure 136/81, pulse (!) 102, temperature 99 F (37.2 C), resp. rate 17, height '6\' 1"'$  (1.854 m), weight 112.2 kg, SpO2 100 %. CVP:  [6 mmHg-20 mmHg] 8 mmHg  Vent Mode: PRVC FiO2 (%):  [40 %-100 %] 60 % Set Rate:  [20 bmp] 20 bmp Vt Set:  [630 mL] 630 mL PEEP:  [5 cmH20] 5 cmH20 Pressure Support:  [14 cmH20] 14 cmH20 Plateau Pressure:  [16 cmH20-20 cmH20] 20 cmH20   Intake/Output Summary (Last 24 hours) at 08/13/2020 0730 Last data filed at 08/13/2020 0700 Gross per 24 hour  Intake 2631.09 ml  Output 2995 ml  Net -363.91 ml   Filed Weights   08/09/20 0500 08/10/20 0458 08/11/20 0500  Weight: 108 kg 112 kg 112.2 kg    Examination: General: morbidly obese male,  HEENT: trach in place  Neuro: he will withdraw to pain, follows no commands  CV: RRR, s1 s2 PULM: BL vented breaths  GI: obese, milidly distended  Extremities: BL LE edema  Skin: pressure wounds    Labs/imaging that I have personally  reviewed    Trach aspirate 5/8: consistent with normal flora  EEG 5/11:  generalized periodic discharges with triphasic morphology at 1 Hz more commonaly seen in toxic-metabolic encephalopathy. moderate diffuse encephalopathy  Labs from this morning reviewed. Sodium 140 Potassium 3.3 Creatinine 4.33 White blood cell count 9.2 Hemoglobin  9.2  Resolved Hospital Problem list     Assessment & Plan:   Critically ill due to acute hypoxic respiratory failure  R pleural effusion, mild pulmonary edema  Plan: Remains on full vent support  Trach in place  Continue trach care  cxr prn  I see no point in weaning this patient.  Remains unresponsive   PCCM will be available for ventilatory support questions.   Cardiogenic shock CAD sp CABG x5 Plan: Per TCTS  Remains on NEPI and impella   Acute Encephalopathy: multifactorial in setting of critical illness. Prolonged icu stay, and posterior circulation CVA 5/14 spent 40mns, no pulsatility VT, torsades, was a limited code, Dr. ABradd Burnerpaced out of VT, only flow would have been from impella during this time. I suspect he now has a profound anoxic brain injury  - dilaudid - prn fent  - could consider MRI to confirm but he has pacer wires   Ongoing fevers - HD catheter exchanged yesterday Would like to remove left subclavian however the catheter site does look clear No additional fevers overnight. - remains on meropenem per TCTS   AKI Oliguric Plan: - crrt restart per nephrology   Colonic ileus KUB with possible colonic ileus - tolerating TF currently  - continue to observe   Anemia, post op  Thrombocytopenia - stable  Trend CBC  AAA Supportive care  PAD s/p partial L foot amputation  Poorly controlled DM2 with microvascular complications.  CBGS + SSI  Best practice   Diet:  Tube Feed   Pain/Anxiety/Delirium protocol (if indicated): Yes (RASS goal 0)  VAP protocol (if indicated): Yes DVT prophylaxis: Systemic AC - Heparin in Impella purge. GI prophylaxis: PPI  Fiber added for diarrhea Glucose control:  SSI Yes - SSI + basal + q4  Central venous access:  Yes, and it is still needed, Arterial line:  Yes, and it is still needed  Foley:  N/A - removed now that on CRRT Mobility:  bed rest  PT consulted: N/A Last date of multidisciplinary goals of care  discussion: on going  Code Status:  full code Disposition: ICU  This patient is critically ill with multiple organ system failure; which, requires frequent high complexity decision making, assessment, support, evaluation, and titration of therapies. This was completed through the application of advanced monitoring technologies and extensive interpretation of multiple databases. During this encounter critical care time was devoted to patient care services described in this note for 33 minutes.  [facility] Critical Care 08/13/2020 7:30 AM

## 2020-08-13 NOTE — Progress Notes (Signed)
Admit: 07/23/2020 LOS: 24  42M progressive AKI after CABG with prolonged shock (cardiogenic and ? Vasodilatory/septic), failure to achieve target volume status despite aggressive diuretics.    Catheter: right IJ Temp IJ CCM placed 5/14  S:  Had Torsades yesterday-  Now on lidocaine drip Remains off of CRRT -  Had 2600 of UOP, ended up pretty even -  Seeing BUN and crt rise however but only minimally-   s/p trach 5/10 No change in neuro status-  eeg-  Toxic metabolic encephalopathy Weaning impalla  k being repleted gently     O: 05/14 0701 - 05/15 0700 In: 2631.1 [I.V.:1023.6; Blood:742.5; NG/GT:150; IV Piggyback:456.1] Out: 2995 [Urine:2695; Emesis/NG output:100; Stool:200]  PE Eyes open but no commands, restless in bed Lungs-  CBS bilat- vent CV- RRR- fresh sternotomy Abd-  Distended Ext-  Pitting edema to dep areas   Filed Weights   08/09/20 0500 08/10/20 0458 08/11/20 0500  Weight: 108 kg 112 kg 112.2 kg    Recent Labs  Lab 08/12/20 0252 08/12/20 0738 08/12/20 1521 08/13/20 0326  NA 137 140 138  139  139 140  K 3.2* 3.5 3.8  3.8  3.8 3.3*  CL 87* 89* 89*  90*  90* 90*  CO2 37* 37* 34*  34*  35* 33*  GLUCOSE 186* 111* 225*  224*  225* 125*  BUN 42* 44* 53*  52*  53* 62*  CREATININE 3.34* 3.59* 4.00*  3.88*  3.94* 4.33*  CALCIUM 8.3* 8.1* 8.0*  8.1*  8.1* 8.3*  PHOS 3.2  --  5.0* 5.0*   Recent Labs  Lab 08/12/20 0252 08/12/20 0738 08/13/20 0306  WBC 9.7 10.2 9.2  NEUTROABS  --  7.4  --   HGB 7.4* 7.5* 9.2*  HCT 24.2* 24.4* 28.7*  MCV 93.4 92.1 90.8  PLT 289 306 289    Scheduled Meds: . artificial tears  1 application Both Eyes K4Y  . aspirin EC  325 mg Oral Daily   Or  . aspirin  324 mg Per Tube Daily  . B-complex with vitamin C  1 tablet Per Tube Daily  . chlorhexidine gluconate (MEDLINE KIT)  15 mL Mouth Rinse BID  . Chlorhexidine Gluconate Cloth  6 each Topical Daily  . darbepoetin (ARANESP) injection - NON-DIALYSIS  150 mcg  Subcutaneous Q Wed-1800  . feeding supplement (PROSource TF)  45 mL Per Tube 5 X Daily  . fiber  1 packet Per Tube BID  . HYDROmorphone HCl  0.5 mg Oral Q6H  . insulin aspart  3-9 Units Subcutaneous Q4H  . levalbuterol  0.63 mg Nebulization TID  . mouth rinse  15 mL Mouth Rinse 10 times per day  . midodrine  10 mg Per Tube TID WC  . pantoprazole sodium  40 mg Per Tube Daily  . rosuvastatin  40 mg Per Tube Daily  . sodium chloride flush  10-40 mL Intracatheter Q12H   Continuous Infusions: . sodium chloride Stopped (08/01/20 1202)  . sodium chloride    . calcium gluconate infusion for CRRT Stopped (08/11/20 0900)  . citrate dextrose 3,000 mL (08/10/20 0526)  . DOBUTamine 2.5 mcg/kg/min (08/13/20 0700)  . feeding supplement (VITAL 1.5 CAL) Stopped (08/10/20 1530)  . heparin 1,600 Units/hr (08/13/20 0700)  . impella catheter heparin 50 unit/mL in dextrose 5% 10.5 mL/hr at 08/09/20 2227  . lidocaine 1 mg/min (08/13/20 0700)  . magnesium sulfate bolus IVPB    . meropenem (MERREM) IV Stopped (08/12/20 2217)  . norepinephrine (  LEVOPHED) Adult infusion 2 mcg/min (08/13/20 0700)  . prismasol BGK 4/2.5 1,500 mL/hr at 08/11/20 0747   PRN Meds:.sodium chloride, Place/Maintain arterial line **AND** sodium chloride, acetaminophen (TYLENOL) oral liquid 160 mg/5 mL, fentaNYL (SUBLIMAZE) injection, heparin, ondansetron (ZOFRAN) IV, sodium chloride, sodium chloride flush  ABG    Component Value Date/Time   PHART 7.598 (H) 08/11/2020 0356   PCO2ART 52.8 (H) 08/11/2020 0356   PO2ART 127 (H) 08/11/2020 0356   HCO3 51.2 (H) 08/11/2020 0356   TCO2 43 (H) 08/11/2020 0742   ACIDBASEDEF 2.0 08/05/2020 0400   O2SAT 70.0 08/13/2020 0306    A/P  1. Dialysis dependent AKI  secondary to  cardiorenal syndrome probably some ATN s/p CABG.  Required CRRT from 5/3 to 5/13, currently on holiday.  Is nonoliguric - BUN and crt rising but only slightly.   continue to hold CRRT for the next 24 hours 2. Acute  systolic CHF / ICM; impella but weaning,  Currently on dobut and norepi- slowly improving per cards-  CVP 7-9   -  Still edematous but because is third spacing 3. CAD s/p  5V CABG 4/26 4. Anemia-  Have added ESA -  Transfuse PRN-  Given one unit on 5/12 5. DM2 6. PAD hx/o Fem Pop Bypass 7. Diuretic Resistance -  Volume status correcting with CRRT -   Managed to stay even with I's and O's last 24 hours, lasix given by other service  8. VDRF  9. Numerous ischemic bilateral posterior CVAs, neurology following-- this is the major issue 10. Hypokalemia-   gentle repletion    giving a CRRT holiday and see what UOP and labs do-  His baseline renal function albeit 6 weeks ago was a crt under 2.    There is a pretty significant chance we will need to resume CRRT/IHD in 24 hours or so -  CCM changed out line to fresh on 5/13 for that purpose    agree with goals of care conversation, grim prognosis given his prolonged neurological insult.  Partial code-  But proceeding with peg and trach.   if he survives and remains RRT dep his only long term dispo option is [place], [place]  [employer]

## 2020-08-13 NOTE — Plan of Care (Signed)
  Problem: Respiratory: Goal: Respiratory status will improve Outcome: Progressing   

## 2020-08-13 NOTE — Progress Notes (Addendum)
Patient ID: Danny Carpenter, male   DOB: 1950/09/23, 70 y.o.   MRN: [id number]     Advanced Heart Failure Rounding Note  PCP-Cardiologist: None   Subjective:    - 4/26 S/P CABG - 5/4 Swan removed with ongoing fevers, CVVH started - CT head on 5/6 with multiple posterior circulation infarcts.  - 5/10 tracheostomy. CT head similar to prior with multiple small infarcts.  - 5/14 Torsades -> paced out by Dr. Orvan Seen.   Had torsades yesterday and paced out by Dr. Orvan Seen. Amio gtt switched to lidocaine. No recurrence.   On NE 2. DBA 2.5. Impella turned back up from P3 -> P7 yesterday after code  Co-ox 70% Remains v paced   Remains on Vent through trach. Will open eyes and withdraw to pain but will not follow commands.   Has been off CRRT since Friday for HD holiday. Made about 2.5 L of urine yesterday. Cr up to 4.3  He is on meropenem. Tmax 102.7   Impella P7 Flow 4.0 Waveforms ok LDH 271 Position of Impella stable on 5/12 echo.   Objective:   Weight Range: 112.2 kg Body mass index is 32.63 kg/m.   Vital Signs:   Temp:  [98.8 F (37.1 C)-102.74 F (39.3 C)] 99 F (37.2 C) (05/15 0700) Pulse Rate:  [35-109] 98 (05/15 0817) Resp:  [16-30] 25 (05/15 0817) BP: (100-168)/(55-146) 115/71 (05/15 0817) SpO2:  [87 %-100 %] 100 % (05/15 0817) Arterial Line BP: (47-147)/(44-85) 122/77 (05/15 0700) FiO2 (%):  [40 %-100 %] 50 % (05/15 0817) Last BM Date: 08/12/20  Weight change: Filed Weights   08/09/20 0500 08/10/20 0458 08/11/20 0500  Weight: 108 kg 112 kg 112.2 kg    Intake/Output:   Intake/Output Summary (Last 24 hours) at 08/13/2020 0956 Last data filed at 08/13/2020 0700 Gross per 24 hour  Intake 2487.31 ml  Output 2945 ml  Net -457.69 ml      Physical Exam   General:  Critically ill, eyes open but not following commands. On vent through trach HEENT: normal Neck: supple. LIJ HD cath. Carotids 2+ bilat; no bruits. No lymphadenopathy or thryomegaly appreciated. Cor:  Impella site ok. Irregular rate & rhythm. No rubs, gallops or murmurs. Lungs: coarse Abdomen: soft, nontender, nondistended. No hepatosplenomegaly. No bruits or masses. Good bowel sounds. Extremities: no cyanosis, clubbing, rash, 2+ edema Neuro: as above  Telemetry    AV pacing with intermittent capture Personally reviewed    Labs    CBC Recent Labs    08/12/20 0738 08/13/20 0306  WBC 10.2 9.2  NEUTROABS 7.4  --   HGB 7.5* 9.2*  HCT 24.4* 28.7*  MCV 92.1 90.8  PLT 306 388   Basic Metabolic Panel Recent Labs    08/12/20 0738 08/12/20 1521 08/13/20 0306 08/13/20 0326  NA 140 138  139  139  --  140  K 3.5 3.8  3.8  3.8  --  3.3*  CL 89* 89*  90*  90*  --  90*  CO2 37* 34*  34*  35*  --  33*  GLUCOSE 111* 225*  224*  225*  --  125*  BUN 44* 53*  52*  53*  --  62*  CREATININE 3.59* 4.00*  3.88*  3.94*  --  4.33*  CALCIUM 8.1* 8.0*  8.1*  8.1*  --  8.3*  MG 2.3  --  2.8*  --   PHOS  --  5.0*  --  5.0*   Liver Function Tests  Recent Labs    08/12/20 1521 08/13/20 0326  AST 81*  --   ALT 60*  --   ALKPHOS 155*  --   BILITOT 1.0  --   PROT 6.0*  --   ALBUMIN 2.0*  2.0* 2.0*   No results for input(s): LIPASE, AMYLASE in the last 72 hours. Cardiac Enzymes No results for input(s): CKTOTAL, CKMB, CKMBINDEX, TROPONINI in the last 72 hours.  BNP: BNP (last 3 results) Recent Labs    07/01/2020 1211  BNP 465.5*    ProBNP (last 3 results) No results for input(s): PROBNP in the last 8760 hours.   D-Dimer No results for input(s): DDIMER in the last 72 hours. Hemoglobin A1C No results for input(s): HGBA1C in the last 72 hours. Fasting Lipid Panel No results for input(s): CHOL, HDL, LDLCALC, TRIG, CHOLHDL, LDLDIRECT in the last 72 hours. Thyroid Function Tests No results for input(s): TSH, T4TOTAL, T3FREE, THYROIDAB in the last 72 hours.  Invalid input(s): FREET3  Other results:   Imaging    DG Chest 1 View  Result Date:  08/12/2020 CLINICAL DATA:  Status post code. EXAM: CHEST  1 VIEW COMPARISON:  08/12/2020 at 6:28 a.m. FINDINGS: RIGHT IJ central line, LEFT-sided PICC line, and feeding tube are in place as before. Unchanged appearance of the tracheostomy and Impella device. Stable cardiomegaly. There is increased patchy infiltrate in the RIGHT LOWER lobe. Remote rib fractures. IMPRESSION: Increased infiltrate in the RIGHT LOWER lobe. Stable cardiomegaly. Electronically Signed   By: Nolon Nations M.D.   On: 08/12/2020 15:55   EEG adult  Result Date: 08/12/2020 Lora Havens, MD     08/12/2020  6:42 PM Patient Name: Danny Carpenter MRN: [id number] Epilepsy Attending: Lora Havens Referring Physician/Provider: Claiborne Billings, PA Date: 08/12/2020 Duration: 26.45 mins Patient history: 70 year old male with altered mental status. EEG to evaluate for seizures. Level of alertness:  lethargic AEDs during EEG study: None Technical aspects: This EEG study was done with scalp electrodes positioned according to the 10-20 International system of electrode placement. Electrical activity was acquired at a sampling rate of 500Hz  and reviewed with a high frequency filter of 70Hz  and a low frequency filter of 1Hz . EEG data were recorded continuously and digitally stored. Description: No clear posterior dominant rhythm was seen.  EEG showed continuous generalized 3 to 5 Hz theta-delta slowing. Intermittent generalized periodic discharges with triphasic morphology at 1 Hz were also noted. Hyperventilation and photic stimulation were not performed.    ABNORMALITY - Continuous slow, generalized - Periodic discharges with triphasic morphology, generalized ( GPDs)  IMPRESSION: This study showed generalized periodic discharges with triphasic morphology at 1 Hz which is on the ictal-interictal continuum.  However, the frequency and morphology is more commonly seen in toxic-metabolic encephalopathy. Additionally there is evidence of suggestive of  moderate diffuse encephalopathy, nonspecific etiology. No seizures or definite epileptiform discharges were seen throughout the recording.  [name]     Medications:     Scheduled Medications: . artificial tears  1 application Both Eyes D3T  . aspirin EC  325 mg Oral Daily   Or  . aspirin  324 mg Per Tube Daily  . B-complex with vitamin C  1 tablet Per Tube Daily  . chlorhexidine gluconate (MEDLINE KIT)  15 mL Mouth Rinse BID  . Chlorhexidine Gluconate Cloth  6 each Topical Daily  . darbepoetin (ARANESP) injection - NON-DIALYSIS  150 mcg Subcutaneous Q Wed-1800  . feeding supplement (PROSource TF)  45 mL Per  Tube 5 X Daily  . ferrous [id number] C-folic acid  1 capsule Oral BID PC  . fiber  1 packet Per Tube BID  . HYDROmorphone HCl  0.5 mg Oral Q6H  . insulin aspart  3-9 Units Subcutaneous Q4H  . levalbuterol  0.63 mg Nebulization TID  . mouth rinse  15 mL Mouth Rinse 10 times per day  . midodrine  10 mg Per Tube TID WC  . pantoprazole sodium  40 mg Per Tube Daily  . rosuvastatin  40 mg Per Tube Daily  . sodium chloride flush  10-40 mL Intracatheter Q12H    Infusions: . sodium chloride Stopped (08/01/20 1202)  . sodium chloride    . calcium gluconate infusion for CRRT Stopped (08/11/20 0900)  . citrate dextrose 3,000 mL (08/10/20 0526)  . DOBUTamine 2.5 mcg/kg/min (08/13/20 0700)  . feeding supplement (VITAL 1.5 CAL) Stopped (08/10/20 1530)  . heparin 1,550 Units/hr (08/13/20 0926)  . impella catheter heparin 50 unit/mL in dextrose 5% 10.5 mL/hr at 08/09/20 2227  . lidocaine 1 mg/min (08/13/20 0700)  . magnesium sulfate bolus IVPB    . meropenem (MERREM) IV Stopped (08/12/20 2217)  . norepinephrine (LEVOPHED) Adult infusion 2 mcg/min (08/13/20 0700)    PRN Medications: sodium chloride, Place/Maintain arterial line **AND** sodium chloride, acetaminophen (TYLENOL) oral liquid 160 mg/5 mL, fentaNYL (SUBLIMAZE) injection, heparin, ondansetron (ZOFRAN) IV,  sodium chloride, sodium chloride flush  Assessment/Plan   1. Cardiogenic shock: Ischemic cardiomyopathy, post-CABG on 4/26.  He has Impella 5.5 in place, down to P4 with no alarms and stable LDH.  Limited echo 4/29 with EF< 20%, the RV appears normal in size with severe systolic dysfunction. Now on NE 10 and dobutamine 2.5. Echo with some improvement, EF in 30-35% range on 5/9 echo. ?Component of septic/distributive shock with increased pressor requirement over the last couple days. Had torsades arrest on 5/14 and paced out. Now Full Code again. Impella now back at P-7 (just turned down to p-6 by Dr. Orvan Seen this am). On lidocaine  - Wean NE as BP allows. Continue dobutamine 2.5.  - Impella now at P-6. Timing of repeat wean per Drs. Mclean and Atkins - Heparin gtt for Impella.  - Off CVVHD currently. Had good urine output with lasix 160 IV yesterday but BUN/Cr climbing.  2. CAD: s/p CABG x 5 with LIMA-LAD, seq SVG-D1/ramus, seq SVG-PDA/PLV.   - ASA - Crestor - No current s/s ischemia 3. Anemia: Post-op bleeding, back to OR with multiple products given on 4/26 post-CABG.  Hgb 8.1  - Transfuse < 7.5    4. Thrombocytopenia: Resolved. suspect low post-op/post-surgical bleeding and multiple blood products as well as sepsis.  5. PAD: Extensive history.  6. AAA: Monitoring as outpatient.  7. Type 2 DM: Insulin.  Hgb A1c was 11.6, poor control.  8. Atrial fibrillation: Rate controlled.   - Off amio with torsades and possible QT prolongation 9. Neuro: intubated and sedated, not following commands with sedation wean.  Head CT 5/6 with multiple posterior circulation infarcts. Head CT 5/10 no change.  - awake but not following commands. - Neuro following - Palliative Care following.  10. ID:  PCT 7.5 on 4/29. Tmax 102.7 Cultures so far negative.  - D/c lines as able.  - Continue meropenem.  - Dr. Aundra Dubin discussed fungal coverage with CCM last week and decided to hold for now 11. Acute hypoxemic  respiratory failure: S/p tracheostomy on 5/10.  ?PNA, PCT 1.19 on 5/10.  - He is on  meropenem.   12. AKI:  CVVH started with marked volume overload and worsening renal function.  Avoid hypotension and excessive CVVH rates. - On CVVHD holiday - Good urine output with 160 IV lasix yesterday but BUN/Cr climbing again. Renal following 13. Ileus: Improved 14. Torsades - paced out by Dr Orvan Seen on 5/14 - on lido gtt at 1. Level pending  - remains A paced with intermittent capture. 63. GOC - was limited code and now changed back to Full Code  CRITICAL CARE Performed by: Glori Bickers  Total critical care time: 45 minutes  Critical care time was exclusive of separately billable procedures and treating other patients.  Critical care was necessary to treat or prevent imminent or life-threatening deterioration.  Critical care was time spent personally by me (independent of midlevel providers or residents) on the following activities: development of treatment plan with patient and/or surrogate as well as nursing, discussions with consultants, evaluation of patient's response to treatment, examination of patient, obtaining history from patient or surrogate, ordering and performing treatments and interventions, ordering and review of laboratory studies, ordering and review of radiographic studies, pulse oximetry and re-evaluation of patient's condition.   Glori Bickers, MD 08/13/2020 9:56 AM

## 2020-08-13 NOTE — Progress Notes (Signed)
[place] for Heparin Indication: Impella 5.5  No Known Allergies  Patient Measurements: Height: '6\' 1"'$  (185.4 cm) Weight: 112.2 kg (247 lb 5.7 oz) IBW/kg (Calculated) : 79.9 Heparin Dosing Weight: 102.8 kg  Vital Signs: Temp: 99 F (37.2 C) (05/15 0700) BP: 136/81 (05/15 0600) Pulse Rate: 102 (05/15 0700)  Labs: Recent Labs    08/12/20 0252 08/12/20 0738 08/12/20 1521 08/13/20 0306 08/13/20 0326  HGB 7.4* 7.5*  --  9.2*  --   HCT 24.2* 24.4*  --  28.7*  --   PLT 289 306  --  289  --   HEPARINUNFRC 0.27*  --  0.25* 0.33  --   CREATININE 3.34* 3.59* 4.00*  3.88*  3.94*  --  4.33*    Estimated Creatinine Clearance: 21.4 mL/min (A) (by C-G formula based on SCr of 4.33 mg/dL (H)).   Assessment: 70 yo M presents with NSTEMI and multivessel CAD, now s/p CABG with Impella support. Pharmacy asked to manage systemic heparin.  Heparin level slightly supratherapeutic 0.33 on infusion rate 1600 units/hr. Also receiving 570 u/hr heparin through purge 11.4 ml/hr and purge pressure stable about 400-500. No overt bleeding or infusion issues per discussion with nursing. Hgb improved 9.2, received 2u PRBC yesterday. LDH stable 271, pltc stable.  Goal of Therapy:  Heparin level 0.2-0.3 units/ml Monitor platelets by anticoagulation protocol: Yes   Plan:  Continue heparin purge solution (50 units/ml) Decrease IV heparin to 1550 units/h Check 6hr HL  Richardine Service, PharmD, BCPS PGY2 Cardiology Pharmacy Resident Phone: [phone] 08/13/2020  7:44 AM  Please check [email or web address] for unit-specific pharmacy phone numbers.

## 2020-08-13 NOTE — Progress Notes (Signed)
79yom s/p CABG - remains intubated, on implella support and AKI - currently on CRRT holiday  S/p Torsades de Pointes arrest 5/14 and lidocaine drip '1mg'$ /min started VT quiet today Lidocaine level 5/16 am 1.3 (within range  < 5)   Continue current drip rate and recheck level in am    Byrnes Mill.D. CPP, BCPS Clinical Pharmacist [phone] 08/13/2020 4:46 PM

## 2020-08-13 NOTE — Progress Notes (Signed)
[place] for Heparin Indication: Impella 5.5  No Known Allergies  Patient Measurements: Height: '6\' 1"'$  (185.4 cm) Weight: 112.2 kg (247 lb 5.7 oz) IBW/kg (Calculated) : 79.9 Heparin Dosing Weight: 102.8 kg  Vital Signs: Temp: 100.76 F (38.2 C) (05/15 1615) Temp Source: Bladder (05/15 1200) BP: 109/73 (05/15 1615) Pulse Rate: 96 (05/15 1615)  Labs: Recent Labs    08/12/20 0738 08/12/20 1521 08/13/20 0306 08/13/20 0326 08/13/20 1526 08/13/20 1607  HGB 7.5*  --  9.2*  --  9.2*  --   HCT 24.4*  --  28.7*  --  28.9*  --   PLT 306  --  289  --  303  --   HEPARINUNFRC  --  0.25* 0.33  --   --  0.41  CREATININE 3.59* 4.00*  3.88*  3.94*  --  4.33* 4.87*  --     Estimated Creatinine Clearance: 19.1 mL/min (A) (by C-G formula based on SCr of 4.87 mg/dL (H)).   Assessment: 70 yo M presents with NSTEMI and multivessel CAD, now s/p CABG with Impella support. Pharmacy asked to manage systemic heparin.  Heparin level trending up to 0.4 on heparin  infusion rate 1500 units/hr. Could be since off CRRT and Cr climbing 4.8. Also receiving 550 u/hr heparin through purge 11 ml/hr and purge pressure stable about 400-500. No overt bleeding or infusion issues per discussion with nursing. Hgb improved 9.2, received 2u PRBC 5/14. LDH stable 271, pltc stable.  Goal of Therapy:  Heparin level 0.2-0.3 units/ml Monitor platelets by anticoagulation protocol: Yes   Plan:  Contine heparin purge solution (50 units/ml) Decrease IV heparin to 1400 units/h q12 labs at No Name.D. CPP, BCPS Clinical Pharmacist [phone] 08/13/2020 5:34 PM    Please check [email or web address] for unit-specific pharmacy phone numbers.

## 2020-08-13 NOTE — Plan of Care (Signed)
  Problem: Clinical Measurements: Goal: Ability to maintain clinical measurements within normal limits will improve Outcome: Progressing Goal: Respiratory complications will improve Outcome: Progressing Goal: Cardiovascular complication will be avoided Outcome: Progressing   Problem: Pain Managment: Goal: General experience of comfort will improve Outcome: Progressing   Problem: Safety: Goal: Ability to remain free from injury will improve Outcome: Progressing   Problem: Skin Integrity: Goal: Risk for impaired skin integrity will decrease Outcome: Progressing   Problem: Respiratory: Goal: Respiratory status will improve Outcome: Progressing

## 2020-08-14 ENCOUNTER — Inpatient Hospital Stay (HOSPITAL_COMMUNITY): Payer: No Typology Code available for payment source

## 2020-08-14 DIAGNOSIS — R570 Cardiogenic shock: Secondary | ICD-10-CM | POA: Diagnosis not present

## 2020-08-14 DIAGNOSIS — J9690 Respiratory failure, unspecified, unspecified whether with hypoxia or hypercapnia: Secondary | ICD-10-CM | POA: Diagnosis not present

## 2020-08-14 DIAGNOSIS — N179 Acute kidney failure, unspecified: Secondary | ICD-10-CM

## 2020-08-14 DIAGNOSIS — R4182 Altered mental status, unspecified: Secondary | ICD-10-CM | POA: Diagnosis not present

## 2020-08-14 DIAGNOSIS — I5043 Acute on chronic combined systolic (congestive) and diastolic (congestive) heart failure: Secondary | ICD-10-CM | POA: Diagnosis not present

## 2020-08-14 DIAGNOSIS — Z515 Encounter for palliative care: Secondary | ICD-10-CM | POA: Diagnosis not present

## 2020-08-14 DIAGNOSIS — Z951 Presence of aortocoronary bypass graft: Secondary | ICD-10-CM | POA: Diagnosis not present

## 2020-08-14 DIAGNOSIS — J9601 Acute respiratory failure with hypoxia: Secondary | ICD-10-CM | POA: Diagnosis not present

## 2020-08-14 LAB — BASIC METABOLIC PANEL
Anion gap: 14 (ref 5–15)
BUN: 72 mg/dL — ABNORMAL HIGH (ref 8–23)
CO2: 27 mmol/L (ref 22–32)
Calcium: 7.9 mg/dL — ABNORMAL LOW (ref 8.9–10.3)
Chloride: 96 mmol/L — ABNORMAL LOW (ref 98–111)
Creatinine, Ser: 4.84 mg/dL — ABNORMAL HIGH (ref 0.61–1.24)
GFR, Estimated: 12 mL/min — ABNORMAL LOW (ref >60)
Glucose, Bld: 142 mg/dL — ABNORMAL HIGH (ref 70–99)
Potassium: 3.2 mmol/L — ABNORMAL LOW (ref 3.5–5.1)
Sodium: 137 mmol/L (ref 135–145)

## 2020-08-14 LAB — CBC
HCT: 29.3 % — ABNORMAL LOW (ref 39.0–52.0)
Hemoglobin: 9.3 g/dL — ABNORMAL LOW (ref 13.0–17.0)
MCH: 28.9 pg (ref 26.0–34.0)
MCHC: 31.7 g/dL (ref 30.0–36.0)
MCV: 91 fL (ref 80.0–100.0)
Platelets: 284 10*3/uL (ref 150–400)
RBC: 3.22 MIL/uL — ABNORMAL LOW (ref 4.22–5.81)
RDW: 18.7 % — ABNORMAL HIGH (ref 11.5–15.5)
WBC: 8 10*3/uL (ref 4.0–10.5)
nRBC: 0 % (ref 0.0–0.2)

## 2020-08-14 LAB — COOXEMETRY PANEL
Carboxyhemoglobin: 1.4 % (ref 0.5–1.5)
Methemoglobin: 1.2 % (ref 0.0–1.5)
O2 Saturation: 68.4 %
Total hemoglobin: 10 g/dL — ABNORMAL LOW (ref 12.0–16.0)

## 2020-08-14 LAB — HEPARIN LEVEL (UNFRACTIONATED)
Heparin Unfractionated: 0.37 IU/mL (ref 0.30–0.70)
Heparin Unfractionated: 0.28 IU/mL — ABNORMAL LOW (ref 0.30–0.70)

## 2020-08-14 LAB — GLUCOSE, CAPILLARY
Glucose-Capillary: 153 mg/dL — ABNORMAL HIGH (ref 70–99)
Glucose-Capillary: 117 mg/dL — ABNORMAL HIGH (ref 70–99)
Glucose-Capillary: 139 mg/dL — ABNORMAL HIGH (ref 70–99)
Glucose-Capillary: 102 mg/dL — ABNORMAL HIGH (ref 70–99)
Glucose-Capillary: 94 mg/dL (ref 70–99)
Glucose-Capillary: 102 mg/dL — ABNORMAL HIGH (ref 70–99)

## 2020-08-14 LAB — LIDOCAINE LEVEL: Lidocaine Lvl: 1.5 ug/mL (ref 1.5–5.0)

## 2020-08-14 LAB — LACTATE DEHYDROGENASE: LDH: 290 U/L — ABNORMAL HIGH (ref 98–192)

## 2020-08-14 MED ORDER — POTASSIUM CHLORIDE 20 MEQ PO PACK
40.0000 meq | PACK | Freq: Once | ORAL | Status: AC
  Administered 2020-08-14: 40 meq
  Filled 2020-08-14: qty 2

## 2020-08-14 MED ORDER — SODIUM CHLORIDE 0.9 % IV SOLN
200.0000 mg | Freq: Once | INTRAVENOUS | Status: AC
  Administered 2020-08-14: 200 mg via INTRAVENOUS
  Filled 2020-08-14: qty 200

## 2020-08-14 MED ORDER — SODIUM CHLORIDE 0.9 % IV SOLN
100.0000 mg | INTRAVENOUS | Status: DC
  Administered 2020-08-15: 100 mg via INTRAVENOUS
  Filled 2020-08-14 (×2): qty 100

## 2020-08-14 MED ORDER — POTASSIUM CHLORIDE 10 MEQ/50ML IV SOLN
10.0000 meq | INTRAVENOUS | Status: AC
  Administered 2020-08-14 (×3): 10 meq via INTRAVENOUS
  Filled 2020-08-14 (×2): qty 50

## 2020-08-14 MED ORDER — MEXILETINE HCL 200 MG PO CAPS
200.0000 mg | ORAL_CAPSULE | Freq: Two times a day (BID) | ORAL | Status: DC
  Administered 2020-08-14 – 2020-08-23 (×19): 200 mg
  Filled 2020-08-14 (×20): qty 1

## 2020-08-14 NOTE — Progress Notes (Addendum)
SLP Cancellation Note  Patient Details Name: Danny Carpenter MRN: [id number] DOB: 08/02/1950   Cancelled treatment:       Reason Eval/Treat Not Completed: Patient not medically ready. SLP continues to follow up on pt, but he is currently sedated on the vent, not following commands per MD notes. Will f/u as able.    Osie Bond., M.A. [facility] Pager [phone] Office [phone]  08/14/2020, 9:05 AM

## 2020-08-14 NOTE — Plan of Care (Signed)
  Problem: Clinical Measurements: Goal: Cardiovascular complication will be avoided Outcome: Progressing   Problem: Nutrition: Goal: Adequate nutrition will be maintained Outcome: Progressing   Problem: Pain Managment: Goal: General experience of comfort will improve Outcome: Progressing   

## 2020-08-14 NOTE — Progress Notes (Signed)
20 Days Post-Op Procedure(s) (LRB): EXPLORATION POST OPERATIVE OPEN HEART (N/A) Subjective: sedated  Objective: Vital signs in last 24 hours: Temp:  [99.32 F (37.4 C)-101.48 F (38.6 C)] 100.76 F (38.2 C) (05/16 0700) Pulse Rate:  [36-118] 89 (05/16 0700) Cardiac Rhythm: A-V Sequential paced (05/15 2000) Resp:  [12-33] 20 (05/16 0700) BP: (92-152)/(50-110) 108/71 (05/16 0700) SpO2:  [97 %-100 %] 98 % (05/16 0700) Arterial Line BP: (86-156)/(47-126) 102/56 (05/16 0702) FiO2 (%):  [40 %-50 %] 40 % (05/16 0405) Weight:  [110.4 kg] 110.4 kg (05/16 0600)  Hemodynamic parameters for last 24 hours: CVP:  [3 mmHg-22 mmHg] 6 mmHg  Intake/Output from previous day: 05/15 0701 - 05/16 0700 In: 2168.5 [I.V.:1043.7; NG/GT:534.5; IV Piggyback:318.3] Out: 2700 [Urine:2200; Stool:500] Intake/Output this shift: No intake/output data recorded.  General appearance: slowed mentation Neurologic: cannot fully assess Heart: regular rate and rhythm, S1, S2 normal, no murmur, click, rub or gallop Lungs: diminished breath sounds bibasilar Abdomen: distended Extremities: edema 1+ Wound: c/d/i  Lab Results: Recent Labs    08/13/20 1526 08/14/20 0428  WBC 8.6 8.0  HGB 9.2* 9.3*  HCT 28.9* 29.3*  PLT 303 284   BMET:  Recent Labs    08/13/20 1526 08/14/20 0428  NA 138 137  K 3.3* 3.2*  CL 92* 96*  CO2 29 27  GLUCOSE 95 142*  BUN 69* 72*  CREATININE 4.87* 4.84*  CALCIUM 7.9* 7.9*    PT/INR: No results for input(s): LABPROT, INR in the last 72 hours. ABG    Component Value Date/Time   PHART 7.598 (H) 08/11/2020 0356   HCO3 51.2 (H) 08/11/2020 0356   TCO2 43 (H) 08/11/2020 0742   ACIDBASEDEF 2.0 08/05/2020 0400   O2SAT 68.4 08/14/2020 0428   CBG (last 3)  Recent Labs    08/13/20 1924 08/13/20 2324 08/14/20 0426  GLUCAP 153* 111* 153*    Assessment/Plan: S/P Procedure(s) (LRB): EXPLORATION POST OPERATIVE OPEN HEART (N/A) FUO search  Consider pan-CT scan   LOS: 25  days    Wonda Olds 08/14/2020

## 2020-08-14 NOTE — Progress Notes (Addendum)
Patient ID: Danny Carpenter, male   DOB: Jan 24, 1951, 70 y.o.   MRN: [id number]     Advanced Heart Failure Rounding Note  PCP-Cardiologist: None   Subjective:    - 4/26 S/P CABG - 5/4 Swan removed with ongoing fevers, CVVH started - CT head on 5/6 with multiple posterior circulation infarcts.  - 5/10 tracheostomy. CT head similar to prior with multiple small infarcts.  - 5/14 Torsades -> paced out by Dr. Orvan Seen. Impella turned back up from P3 -> P7  after code. Amio gtt switched to lidocaine.    Rhythm stable. No further VT. A-V paced. Lidocaine level pending   Impella now at P-6. Remains on NE 2. DBA 2.5.  Co-ox 68%.     Has been off CRRT since 5/13 for HD holiday. Made about 2.2 L of urine yesterday. Wt down 4 lb. CVP 6-7  He is on meropenem. Tmax 101.4   Impella P6 Flow 3.5 Waveforms ok LDH 290 Position of Impella stable on 5/12 echo.   Objective:   Weight Range: 110.4 kg Body mass index is 32.11 kg/m.   Vital Signs:   Temp:  [99.14 F (37.3 C)-101.48 F (38.6 C)] 100.76 F (38.2 C) (05/16 0700) Pulse Rate:  [36-118] 89 (05/16 0700) Resp:  [12-33] 20 (05/16 0700) BP: (92-152)/(50-110) 108/71 (05/16 0700) SpO2:  [97 %-100 %] 98 % (05/16 0700) Arterial Line BP: (86-156)/(47-126) 102/56 (05/16 0702) FiO2 (%):  [40 %-50 %] 40 % (05/16 0405) Weight:  [110.4 kg] 110.4 kg (05/16 0600) Last BM Date: 08/13/20  Weight change: Filed Weights   08/10/20 0458 08/11/20 0500 08/14/20 0600  Weight: 112 kg 112.2 kg 110.4 kg    Intake/Output:   Intake/Output Summary (Last 24 hours) at 08/14/2020 0725 Last data filed at 08/14/2020 0700 Gross per 24 hour  Intake 2168.46 ml  Output 2700 ml  Net -531.54 ml      Physical Exam   CVP 6-7 General:  Critically ill, eyes patched (? Corneal abrasions). On vent through trach HEENT: normal Neck: supple. LIJ HD cath. Carotids 2+ bilat; no bruits. No lymphadenopathy or thryomegaly appreciated. Cor: Impella site ok. Irregular rate  & rhythm. No rubs, gallops or murmurs. + Zoll pads  Lungs: coarse bilaterally. No wheezing Abdomen: soft, nontender, nondistended. No hepatosplenomegaly. No bruits or masses. Good bowel sounds. Extremities: no cyanosis, clubbing, rash, trace bilateral LE edema Neuro: moves bilateral upper extremities. Not following commands   Telemetry    AV pacing 90s Personally reviewed  Labs    CBC Recent Labs    08/12/20 0738 08/13/20 0306 08/13/20 1526 08/14/20 0428  WBC 10.2   < > 8.6 8.0  NEUTROABS 7.4  --  5.9  --   HGB 7.5*   < > 9.2* 9.3*  HCT 24.4*   < > 28.9* 29.3*  MCV 92.1   < > 89.5 91.0  PLT 306   < > 303 284   < > = values in this interval not displayed.   Basic Metabolic Panel Recent Labs    08/12/20 1521 08/13/20 0306 08/13/20 0326 08/13/20 1526 08/14/20 0428  NA 138  139  139  --  140 138 137  K 3.8  3.8  3.8  --  3.3* 3.3* 3.2*  CL 89*  90*  90*  --  90* 92* 96*  CO2 34*  34*  35*  --  33* 29 27  GLUCOSE 225*  224*  225*  --  125* 95 142*  BUN 53*  52*  53*  --  62* 69* 72*  CREATININE 4.00*  3.88*  3.94*  --  4.33* 4.87* 4.84*  CALCIUM 8.0*  8.1*  8.1*  --  8.3* 7.9* 7.9*  MG  --  2.8*  --  2.5*  --   PHOS 5.0*  --  5.0*  --   --    Liver Function Tests Recent Labs    08/12/20 1521 08/13/20 0326  AST 81*  --   ALT 60*  --   ALKPHOS 155*  --   BILITOT 1.0  --   PROT 6.0*  --   ALBUMIN 2.0*  2.0* 2.0*   No results for input(s): LIPASE, AMYLASE in the last 72 hours. Cardiac Enzymes No results for input(s): CKTOTAL, CKMB, CKMBINDEX, TROPONINI in the last 72 hours.  BNP: BNP (last 3 results) Recent Labs    07/24/2020 1211  BNP 465.5*    ProBNP (last 3 results) No results for input(s): PROBNP in the last 8760 hours.   D-Dimer No results for input(s): DDIMER in the last 72 hours. Hemoglobin A1C No results for input(s): HGBA1C in the last 72 hours. Fasting Lipid Panel No results for input(s): CHOL, HDL, LDLCALC, TRIG, CHOLHDL,  LDLDIRECT in the last 72 hours. Thyroid Function Tests No results for input(s): TSH, T4TOTAL, T3FREE, THYROIDAB in the last 72 hours.  Invalid input(s): FREET3  Other results:   Imaging    No results found.   Medications:     Scheduled Medications: . aspirin EC  325 mg Oral Daily   Or  . aspirin  324 mg Per Tube Daily  . chlorhexidine gluconate (MEDLINE KIT)  15 mL Mouth Rinse BID  . Chlorhexidine Gluconate Cloth  6 each Topical Daily  . darbepoetin (ARANESP) injection - NON-DIALYSIS  150 mcg Subcutaneous Q Wed-1800  . feeding supplement (PROSource TF)  45 mL Per Tube 5 X Daily  . ferrous [id number] C-folic acid  1 capsule Oral BID PC  . fiber  1 packet Per Tube BID  . Gerhardt's butt cream   Topical QID  . insulin aspart  3-9 Units Subcutaneous Q4H  . insulin detemir  29 Units Subcutaneous BID  . levalbuterol  0.63 mg Nebulization TID  . mouth rinse  15 mL Mouth Rinse 10 times per day  . midodrine  10 mg Per Tube TID WC  . pantoprazole sodium  40 mg Per Tube Daily  . rosuvastatin  40 mg Per Tube Daily  . sodium chloride flush  10-40 mL Intracatheter Q12H    Infusions: . sodium chloride Stopped (08/01/20 1202)  . sodium chloride    . DOBUTamine 2.5 mcg/kg/min (08/14/20 0700)  . feeding supplement (VITAL 1.5 CAL) 30 mL/hr at 08/14/20 0313  . fentaNYL infusion INTRAVENOUS 125 mcg/hr (08/14/20 0700)  . heparin 1,200 Units/hr (08/14/20 0700)  . impella catheter heparin 50 unit/mL in dextrose 5% 10.5 mL/hr at 08/09/20 2227  . lidocaine 1 mg/min (08/14/20 0700)  . magnesium sulfate bolus IVPB    . meropenem (MERREM) IV Stopped (08/13/20 2223)  . norepinephrine (LEVOPHED) Adult infusion 2 mcg/min (08/14/20 0700)  . potassium chloride 50 mL/hr at 08/14/20 0700    PRN Medications: sodium chloride, Place/Maintain arterial line **AND** sodium chloride, acetaminophen (TYLENOL) oral liquid 160 mg/5 mL, fentaNYL (SUBLIMAZE) injection, heparin, ondansetron (ZOFRAN)  IV, polyvinyl alcohol, sodium chloride flush  Assessment/Plan   1. Cardiogenic shock: Ischemic cardiomyopathy, post-CABG on 4/26.  He has Impella 5.5 in place, down to P4 with no alarms  and stable LDH.  Limited echo 4/29 with EF< 20%, the RV appears normal in size with severe systolic dysfunction. Now on NE 10 and dobutamine 2.5. Echo with some improvement, EF in 30-35% range on 5/9 echo. ?Component of septic/distributive shock with increased pressor requirement over the last couple days. Had torsades arrest on 5/14 and paced out. Now Full Code again. Impella now back at P-6. On lidocaine  - Wean NE as BP allows. Continue dobutamine 2.5.  - Impella now at P-6. Timing of repeat wean per Drs. Mylan Schwarz and Atkins - Heparin gtt for Impella.  - Off CVVHD currently. Had good urine output yesterday BUN/Cr fairly stable  2. CAD: s/p CABG x 5 with LIMA-LAD, seq SVG-D1/ramus, seq SVG-PDA/PLV.   - ASA - Crestor - No current s/s ischemia 3. Anemia: Post-op bleeding, back to OR with multiple products given on 4/26 post-CABG.  Hgb 9.3 - Transfuse < 7.5    4. Thrombocytopenia: Resolved. suspect low post-op/post-surgical bleeding and multiple blood products as well as sepsis.  5. PAD: Extensive history.  6. AAA: Monitoring as outpatient.  7. Type 2 DM: Insulin.  Hgb A1c was 11.6, poor control.  8. Atrial fibrillation: Rate controlled.   - Off amio with torsades and possible QT prolongation 9. Neuro: intubated and sedated, not following commands with sedation wean.  Head CT 5/6 with multiple posterior circulation infarcts. Head CT 5/10 no change.  - awake but not following commands. - Neuro following - Palliative Care following.  10. ID:  PCT 7.5 on 4/29. Tmax 101.5 Cultures so far negative.  - D/c lines as able.  - Continue meropenem. - add empiric antifungal    - Dr. Aundra Dubin discussed fungal coverage with CCM last week and decided to hold for now 11. Acute hypoxemic respiratory failure: S/p tracheostomy  on 5/10.  ?PNA, PCT 1.19 on 5/10.  - He is on meropenem.   12. AKI:  CVVH started with marked volume overload and worsening renal function.  Avoid hypotension and excessive CVVH rates. - On CVVHD holiday - Good urine output  BUN/Cr stable today. Renal following 13. Ileus: Improved 14. Torsades - paced out by Dr Orvan Seen on 5/14 - on lido gtt at 1. Level pending  - remains A paced with intermittent capture. 50. GOC - was limited code and now changed back to [place], [place] 08/14/2020  Patient seen with PA, agree with the above note.   Off RRT, made 2200 cc urine yesterday with stable creatinine 4.84.  CVP 6-7, co-ox 68%.  He remains on dobutamine 2.5 and NE 1.    He is on meropenem, still running fevers.  Cultures so far negative.   Impella remains in place at P6, no alarms.   No further VT   Trach, FiO2 0.4.   General: NAD, trach Neck: No JVD, no thyromegaly or thyroid nodule.  Lungs: Decreased at bases.  CV: Nondisplaced PMI.  Heart regular S1/S2, no S3/S4, no murmur.  1+ ankle edema.  Abdomen: Soft, nontender, no hepatosplenomegaly, no distention.  Skin: Intact without lesions or rashes.  Neurologic: ?Some purposeful movement (pulling at lines).  Extremities: No clubbing or cyanosis.  HEENT: Normal.   Continue dobutamine 2.5, can wean down NE as MAP allows.  Decrease Impella to P5, restart slow wean.   Good UOP, creatinine stable.  CVP 6-7.  Will not give Lasix today, replace K gently.  ?Some degree of renal recovery.   Febrile still, will reculture and hold for fungi.  Will add antifungal coverage with Eraxis. Will finish meropenem today.  ?ID consult.   Still difficult to tell neurological status, pulling some at lines per nursing.   CRITICAL CARE Performed by: Loralie Champagne  Total critical care time: 40 minutes  Critical care time was exclusive of separately billable procedures and treating other patients.  Critical care was necessary to treat  or prevent imminent or life-threatening deterioration.  Critical care was time spent personally by me on the following activities: development of treatment plan with patient and/or surrogate as well as nursing, discussions with consultants, evaluation of patient's response to treatment, examination of patient, obtaining history from patient or surrogate, ordering and performing treatments and interventions, ordering and review of laboratory studies, ordering and review of radiographic studies, pulse oximetry and re-evaluation of patient's condition.  Loralie Champagne 08/14/2020 7:53 AM

## 2020-08-14 NOTE — Progress Notes (Signed)
Nutrition Follow-up  DOCUMENTATION CODES:   Not applicable  INTERVENTION:   Tube Feeding via Cortrak:  Goal rate: Vital 1.5 at 55 ml/hr Pro-source TF45 mL5 times daily Provides 2180 kcals, 144g of protein and 1003 mL of free water   NUTRITION DIAGNOSIS:   Inadequate oral intake related to acute illness as evidenced by NPO status.  Being addressed via TF GOAL:   Patient will meet greater than or equal to 90% of their needs  Progressing  MONITOR:   Vent status,TF tolerance,Labs,Weight trends  REASON FOR ASSESSMENT:   Ventilator    ASSESSMENT:   71 yo male admitted with abnormal stress test with chest pain, SOB, elevated troponin with acute on chronic CHF. PMH includes HTN, HLD, DM, CVA, AAA, left food amputation, CHF   4/26 CABG x 5, brief CPR, Intubated 4/29 Cortrak inserted, gastric 5/03 CRRT initiated 5/06 CT head: multiple posterior circulation infarcts 5/10 Trach placed 5/13 CRRT discontinued 5/14 Torsades  Pt remains on vent support, off CRRT-holding on RRT a this time Impella remains in place. Remains on levophed and dopamine  Vital 1.5 at 30 ml/hr, increasing to 40 ml/hr, goal 55 ml/hr  Rectal tube in place, +stool Off reglan  Labs: potassium 2.3 (L) Meds: nutrisource fiber, trinsicon, ss novolog, levemir, KCl   Diet Order:   Diet Order            Diet NPO time specified  Diet effective midnight                 EDUCATION NEEDS:   Not appropriate for education at this time  Skin:  Skin Assessment: Skin Integrity Issues: Skin Integrity Issues:: DTI,Other (Comment) DTI: buttocks Other: skin tear to anus  Last BM:  5/16 rectal tube  Height:   Ht Readings from Last 1 Encounters:  08/03/20 '6\' 1"'$  (1.854 m)    Weight:   Wt Readings from Last 1 Encounters:  08/14/20 110.4 kg   BMI:  Body mass index is 32.11 kg/m.  Estimated Nutritional Needs:   Kcal:  2100-2300 kcals  Protein:  130-150 g  Fluid:  >/= 2 L   Kerman Passey MS, RDN, LDN, CNSC Registered Dietitian III Clinical Nutrition RD Pager and On-Call Pager Number Located in [place]

## 2020-08-14 NOTE — Progress Notes (Signed)
ANTICOAGULATION CONSULT NOTE - Follow Up Consult  Pharmacy Consult for heparin Indication: Impella  Labs: Recent Labs    08/12/20 1521 08/13/20 0306 08/13/20 0326 08/13/20 1526 08/13/20 1607 08/14/20 0428  HGB  --  9.2*  --  9.2*  --  9.3*  HCT  --  28.7*  --  28.9*  --  29.3*  PLT  --  289  --  303  --  284  HEPARINUNFRC 0.25* 0.33  --   --  0.41 0.37  CREATININE 4.00*  3.88*  3.94*  --  4.33* 4.87*  --   --     Assessment: 70yo male remains supratherapeutic on heparin with very little change in level after rate decrease; no gtt issues or signs of bleeding per RN.  Pt getting ~550 units/hr via purge in addition to systemic infusion at 1400 units/hr.  Goal of Therapy:  Heparin level 0.2-0.3 units/ml   Plan:  Will decrease heparin gtt by 2 units/kg/hr to 1200 units/hr and check level with next scheduled labs.    Wynona Neat, PharmD, BCPS  08/14/2020,5:25 AM

## 2020-08-14 NOTE — Progress Notes (Signed)
Admit: 07/17/2020 LOS: 25  65M progressive AKI after CABG with prolonged shock (cardiogenic and ? Vasodilatory/septic), failure to achieve target volume status despite aggressive diuretics.    Catheter: right IJ Temp IJ CCM placed 5/14  S:  No VT. Impella at p-6 and on norepi and dobutamine. Weight improving without CRRT with stable creatinine and uop of 2.1L.   O: 05/15 0701 - 05/16 0700 In: 2168.5 [I.V.:1043.7; NG/GT:534.5; IV Piggyback:318.3] Out: 2700 [Urine:2200; Stool:500]  Vitals:   08/14/20 0804 08/14/20 0900  BP: 132/69 (!) 156/85  Pulse: 93 89  Resp: (!) 24 18  Temp:  99.86 F (37.7 C)  SpO2:  100%     PE Eyes lying in bed in no distress Lungs-bilateral chest rise, ventilated CV-normal rate, sternotomy scar Abd-  Distended Ext-minimal pitting edema  Filed Weights   08/10/20 0458 08/11/20 0500 08/14/20 0600  Weight: 112 kg 112.2 kg 110.4 kg    Recent Labs  Lab 08/12/20 0252 08/12/20 0738 08/12/20 1521 08/13/20 0326 08/13/20 1526 08/14/20 0428  NA 137   < > 138  139  139 140 138 137  K 3.2*   < > 3.8  3.8  3.8 3.3* 3.3* 3.2*  CL 87*   < > 89*  90*  90* 90* 92* 96*  CO2 37*   < > 34*  34*  35* 33* 29 27  GLUCOSE 186*   < > 225*  224*  225* 125* 95 142*  BUN 42*   < > 53*  52*  53* 62* 69* 72*  CREATININE 3.34*   < > 4.00*  3.88*  3.94* 4.33* 4.87* 4.84*  CALCIUM 8.3*   < > 8.0*  8.1*  8.1* 8.3* 7.9* 7.9*  PHOS 3.2  --  5.0* 5.0*  --   --    < > = values in this interval not displayed.   Recent Labs  Lab 08/12/20 0738 08/13/20 0306 08/13/20 1526 08/14/20 0428  WBC 10.2 9.2 8.6 8.0  NEUTROABS 7.4  --  5.9  --   HGB 7.5* 9.2* 9.2* 9.3*  HCT 24.4* 28.7* 28.9* 29.3*  MCV 92.1 90.8 89.5 91.0  PLT 306 289 303 284    Scheduled Meds: . aspirin EC  325 mg Oral Daily   Or  . aspirin  324 mg Per Tube Daily  . chlorhexidine gluconate (MEDLINE KIT)  15 mL Mouth Rinse BID  . Chlorhexidine Gluconate Cloth  6 each Topical Daily  .  darbepoetin (ARANESP) injection - NON-DIALYSIS  150 mcg Subcutaneous Q Wed-1800  . feeding supplement (PROSource TF)  45 mL Per Tube 5 X Daily  . ferrous [id number] C-folic acid  1 capsule Oral BID PC  . fiber  1 packet Per Tube BID  . Gerhardt's butt cream   Topical QID  . insulin aspart  3-9 Units Subcutaneous Q4H  . insulin detemir  29 Units Subcutaneous BID  . levalbuterol  0.63 mg Nebulization TID  . mouth rinse  15 mL Mouth Rinse 10 times per day  . mexiletine  200 mg Per Tube Q12H  . midodrine  10 mg Per Tube TID WC  . pantoprazole sodium  40 mg Per Tube Daily  . potassium chloride  40 mEq Per Tube Once  . rosuvastatin  40 mg Per Tube Daily  . sodium chloride flush  10-40 mL Intracatheter Q12H   Continuous Infusions: . sodium chloride Stopped (08/01/20 1202)  . sodium chloride    . [START ON 08/15/2020] anidulafungin    .  anidulafungin    . DOBUTamine 2.5 mcg/kg/min (08/14/20 1000)  . feeding supplement (VITAL 1.5 CAL) 30 mL/hr at 08/14/20 0800  . fentaNYL infusion INTRAVENOUS 50 mcg/hr (08/14/20 1000)  . heparin 1,200 Units/hr (08/14/20 1000)  . impella catheter heparin 50 unit/mL in dextrose 5% 10.5 mL/hr at 08/09/20 2227  . meropenem (MERREM) IV Stopped (08/13/20 2223)  . norepinephrine (LEVOPHED) Adult infusion Stopped (08/14/20 0939)  . potassium chloride 50 mL/hr at 08/14/20 1000   PRN Meds:.sodium chloride, Place/Maintain arterial line **AND** sodium chloride, acetaminophen (TYLENOL) oral liquid 160 mg/5 mL, fentaNYL (SUBLIMAZE) injection, heparin, polyvinyl alcohol, sodium chloride flush  ABG    Component Value Date/Time   PHART 7.598 (H) 08/11/2020 0356   PCO2ART 52.8 (H) 08/11/2020 0356   PO2ART 127 (H) 08/11/2020 0356   HCO3 51.2 (H) 08/11/2020 0356   TCO2 43 (H) 08/11/2020 0742   ACIDBASEDEF 2.0 08/05/2020 0400   O2SAT 68.4 08/14/2020 0428    A/P  1. Dialysis dependent AKI  secondary to  cardiorenal syndrome probably some ATN s/p CABG.   Required CRRT from 5/3 to 5/13.  BL Crt <2. Creatinine is now stable with minimal rise in BUN.  Good urine output.  Improving volume status.  Continue to hold CRRT for now. 2. Acute systolic CHF / ICM; impella but weaning,  Currently on dobut and norepi-CVP acceptable.  Management per primary.  Urine output adequate.  Could consider Lasix if needed 3. CAD s/p  5V CABG 4/26 4. Anemia-ESA on board-  Transfuse PRN-  Given one unit on 5/12 5. DM2 6. PAD hx/o Fem Pop Bypass 7. Diuretic Resistance -required use of CRRT as above.  Urine output adequate at this time 8. VDRF -remains ventilated 9. Numerous ischemic bilateral posterior CVAs, neurology following-- this is the major issue 10. Hypokalemia-   gentle repletion   agree with goals of care conversation, grim prognosis given his prolonged neurological insult.  Partial code-  But proceeding with peg and trach.   if he survives and remains RRT dep his only long term dispo option is [place], [place]  [employer]

## 2020-08-14 NOTE — Progress Notes (Signed)
[facility] Progress Note Patient Name: Danny Carpenter DOB: Feb 23, 1951 MRN: [id number]   Date of Service  08/14/2020  HPI/Events of Note  K 3.2, Cr 4.8 Has cortrack. CVL also.   eICU Interventions  Replacement per protocol ordered.      Intervention Category Intermediate Interventions: Electrolyte abnormality - evaluation and management  Elmer Sow 08/14/2020, 5:52 AM

## 2020-08-14 NOTE — Progress Notes (Signed)
NAMELopez Carpenter, MRN:  [id number], DOB:  1950/10/05, LOS: 78 ADMISSION DATE:  07/23/2020, CONSULTATION DATE: 07/28/2018 REFERRING MD: Dr. Orvan Seen, CHIEF COMPLAINT: Ventilator dependent  History of Present Illness:  Danny Carpenter is a 70 year old male with past medical history significant for hypertension, hyperlipidemia, type 2 diabetes, CVA, AAA, left foot amputation s/p fem pop bypass and heart failure who presented to the emergency department 4/21 after undergoing outpatient stress test that revealed EF of 20% and acute ischemia.  Since admission patient has been evaluated by cardiology and cardiothoracic surgery and patient underwent TEE and left heart cath.  LHC/RHC revealed severe three-vessel coronary artery disease with moderately elevated left heart and pulmonary artery pressures.  At that time patient was placed on goal-directed medical therapy for acute systolic congestive heart failure and CTS surgery was consulted for CABG potential.  Patient underwent CABG x5 with Dr. Orvan Seen 4/26, post CABG CODE BLUE called due to low flow on Impella, CTS notified.  Patient underwent bedside exploratory postoperative open heart where Dr. Julien Girt reopened the chest incision and hematoma was removed, patient's hemodynamics improved upon opening sternotomy.  PCCM consulted morning of 4/28 due to difficulty weaning ventilator  Pertinent  Medical History  Hypertension, hyperlipidemia, type 2 diabetes, CVA, AAA, left foot amputation s/p fem pop bypass and heart failure   Significant Hospital Events: Including procedures, antibiotic start and stop dates in addition to other pertinent events   . 4/21 presented to the ED for evaluation of abnormal stress test . 4/22 left and right heart cath severe multivessel disease . 4/26 CABG x5 with hemodynamic instability post requiring brief CPR with eventual return back to the OR anastomosis bleeding was seen . 4/28 remains ventilator dependent, slowly weaning  PEEP . 5/1 remains on vent, diuresing, no ready to come off  . 5/3 started on CRRT. . 5/5 requiring sedation for vent  . 5/6 CT scan shows multiple areas of posterior circulation infarction. . 5/8 tolerating CRRT with fluid removal.  Decreasing vasopressor requirements. . 5/12 TCT today . 5/13 VT event, self converted, febrile . 5/14 spent ~30 mins in Torsades until paced out, code status reversed by Dr. Julien Girt, patient spent 30 mins with no pulsatility and no flow except for the impella.  . 5/16 starting eraxis for persistent fevers  Interim History / Subjective:  Persistently febrile overnight, Tmax 101. On RRT holiday, UOP has picked up.  Objective   Blood pressure 132/69, pulse 93, temperature 100.22 F (37.9 C), resp. rate (!) 24, height '6\' 1"'$  (1.854 m), weight 110.4 kg, SpO2 100 %. CVP:  [3 mmHg-22 mmHg] 10 mmHg  Vent Mode: PRVC FiO2 (%):  [40 %] 40 % Set Rate:  [20 bmp] 20 bmp Vt Set:  [630 mL] 630 mL PEEP:  [5 cmH20] 5 cmH20 Plateau Pressure:  [18 cmH20-22 cmH20] 19 cmH20   Intake/Output Summary (Last 24 hours) at 08/14/2020 0914 Last data filed at 08/14/2020 0900 Gross per 24 hour  Intake 2273.35 ml  Output 2535 ml  Net -261.65 ml   Filed Weights   08/10/20 0458 08/11/20 0500 08/14/20 0600  Weight: 112 kg 112.2 kg 110.4 kg   I/O -531, net -15.6L for admission  Examination: General: Critically ill-appearing man lying in bed in no acute distress HEENT: Conjunctival injection, scleral edema, eyes covered Neck: Trach in place, no bleeding or drainage Neuro: Withdraws from pain, moves arms towards his trach during suctioning, intact tracheal cough reflex.  Not responding to commands.  Nonpurposeful response to verbal  stimulation. CV: S1-S2, regular rate and rhythm PULM: Breathing comfortably on mechanical ventilation, rhonchi cleared with suctioning GI: Obese, soft, mildly distended Extremities: Pitting lower extremity edema bilaterally, erythema and onychomycosis on  the right foot.  Amputated left digits. Skin: No rashes or ecchymosis.  No erythema around right subclavian Impella   Labs/imaging that I have personally reviewed    5/14 blood cultures> NGTD 5/8 trach aspirate> normal flora  EEG 5/11:  generalized periodic discharges with triphasic morphology at 1 Hz more commonaly seen in toxic-metabolic encephalopathy. Moderate diffuse encephalopathy  Labs reviewed Potassium 3.2 Bicarb 27 BUN 72, uptrending Creatinine 4.84, uptrending LDH 290, uptrending White blood cell count 8 H/H 9.3/29.3  coox 68% on impella P6, NE, dobutamine  CXR personally reviewed> pulmonary edema, possible RUL infiltrate. Impella, RIJ dialysis catheter in place.  Resolved Hospital Problem list     Assessment & Plan:   Acute hypoxic vent- dependent  respiratory failure  Tracheostomy dependent R pleural effusion, mild pulmonary edema  Plan: -LTVV 4-8cc/kg IBW with goal Pplat<30 and DP<15 -VAP prevention protocol -PAD protocol for sedation-- hasn't been needing -routine trach care -daily SAT & SBT as appropriate; mental status precludes extubation.  Still on very significant cardiac support.  Cardiogenic shock due to ischemic cardiomyopathy CAD s/p CABG x5 - Continue Impella, down to P5 support.  Management per cardiology. - Remains on norepinephrine and dobutamine. -Continue aspirin and statin - Started mexiletine, discontinue lidocaine today - Continue monitoring on telemetry  Acute Encephalopathy: multifactorial in setting of critical illness. Prolonged icu stay, posterior circulation CVA.  Concern for anoxic brain injury during low flow state -Unable to get brain MRI due to pacing wires for further evaluation - Avoid sedating meds as able - Delirium precautions - Continue to monitor renal function and renally dose medications  Ongoing fevers - HD catheter and Foley exchanged -Unable to remove other devices at this time due to critical illness. -  Cultures - Adding Eraxis  AKI Oliguria improved - Strict I's/O - Continue diuretics - Renally dose meds and avoid nephrotoxic meds -con't daily monitoring  Colonic ileus -Increasing TF towards goal, monitor for intolerance.  Acute blood loss anemia due to operative blood loss and critical illness Thrombocytopenia -resolved - Serial CBCs - Transfuse for hemoglobin less than 7 or hemodynamically significant bleeding  AAA -Supportive care, not a candidate for intervention  Hyperglycemia- controlled.  History of poorly controlled diabetes. -Continue long-acting insulin for sliding scale insulin - Goal BG 140-180 while admitted to the ICU  PAD s/p partial L foot amputation  -monitor peripheral circulation -Aspirin and statin  Best practice   Diet:  Tube Feed   Pain/Anxiety/Delirium protocol (if indicated): Yes (RASS goal 0)  VAP protocol (if indicated): Yes DVT prophylaxis: Systemic AC - Heparin  GI prophylaxis: PPI  Fiber added for diarrhea Glucose control:  SSI Yes - SSI + basal  Central venous access:  Yes, and it is still needed, Arterial line:  Yes, and it is still needed  Foley:  Yes, and it is still needed  Mobility:  bed rest  PT consulted: N/A Last date of multidisciplinary goals of care discussion: ongoing -- Palliative meeting today Code Status:  full code Disposition: ICU  This patient is critically ill with multiple organ system failure; which, requires frequent high complexity decision making, assessment, support, evaluation, and titration of therapies. This was completed through the application of advanced monitoring technologies and extensive interpretation of multiple databases. During this encounter critical care time was devoted  to patient care services described in this note for 35 minutes.  Julian Hy, DO [facility] 08/14/2020 12:33 PM

## 2020-08-14 NOTE — Progress Notes (Signed)
[place] for Heparin Indication: Impella 5.5  No Known Allergies  Patient Measurements: Height: '6\' 1"'$  (185.4 cm) Weight: 110.4 kg (243 lb 6.2 oz) IBW/kg (Calculated) : 79.9 Heparin Dosing Weight: 102.8 kg  Vital Signs: Temp: 100.94 F (38.3 C) (05/16 1400) Temp Source: Bladder (05/16 1200) BP: 150/75 (05/16 1544) Pulse Rate: 90 (05/16 1544)  Labs: Recent Labs    08/13/20 0306 08/13/20 0326 08/13/20 1526 08/13/20 1607 08/14/20 0428 08/14/20 1650  HGB 9.2*  --  9.2*  --  9.3*  --   HCT 28.7*  --  28.9*  --  29.3*  --   PLT 289  --  303  --  284  --   HEPARINUNFRC 0.33  --   --  0.41 0.37 0.28*  CREATININE  --  4.33* 4.87*  --  4.84*  --     Estimated Creatinine Clearance: 19 mL/min (A) (by C-G formula based on SCr of 4.84 mg/dL (H)).   Assessment: 70 yo M presents with NSTEMI and multivessel CAD, now s/p CABG with Impella support. Pharmacy asked to manage systemic heparin.  Heparin level 0.28 on heparin  infusion rate 1200 units/hr.  No overt bleeding or infusion issues. Hgb9.3, pltc stable.  Goal of Therapy:  Heparin level 0.2-0.3 units/ml Monitor platelets by anticoagulation protocol: Yes   Plan:  Contine heparin purge solution (50 units/ml) Increase IV heparin to 1250 units/h q12 labs at [place], PharmD, [place] Pharmacist Please see AMION for all Pharmacists' Contact Phone Numbers 08/14/2020, 6:13 PM

## 2020-08-14 NOTE — Progress Notes (Signed)
Progress Note from the Palliative Medicine Team at [facility]   Patient Name: Danny Carpenter       Date: 08/14/2020 DOB: 1951-03-06  Age: 70 y.o. MRN#: [id number] Attending Physician: Wonda Olds, MD Primary Care Physician: Serita Grammes, MD Admit Date: 07/29/2020   Medical records reviewed   70 y.o. male  admitted on 07/14/2020 with  past medical history significant for hypertension, hyperlipidemia, type 2 diabetes, CVA, AAA, left foot amputation s/p fem pop bypass and heart failure who presented to the emergency department 4/21 after undergoing outpatient stress test that revealed EF of 20% and acute ischemia.  Since admission patient was evaluated by cardiology and cardiothoracic surgery and patient underwent TEE and left heart cath. LHC/RHC revealed severe three-vessel coronary artery disease with moderately elevated left heart and pulmonary artery pressures. At that time patient was placed on goal-directed medical therapy for acute systolic congestive heart failure and CTS surgery was consulted for CABG potential.  Patient underwent CABG x5 with Dr. Orvan Seen 4/26, post CABG CODE BLUE called due to low flow on Impella, CTS notified. Patient underwent bedside exploratory postoperative open heart where Dr. Julien Girt reopened the chest incision and hematoma was removed, patient's hemodynamics improved upon opening sternotomy.  PCCM consulted 4/28 due to difficulty weaning ventilator.  He was trached  08-07-20  CRRT initiated on 08/01/2020, currently on hold.       Head CT on 08/04/2020 significant for multiple areas of posterior circulation infarct.  08-12-20 30 minutes in Torsades until paced out, code status reversed  08-14-20 persistent fevers  Per CCM note patient has made some improvement from cardiac standpoint, but neurologic recovery remains uncertain.  Long-term HD is un- certain.  Decision made last week with  large family meeting to proceed with trach,, PEG and  continued current medical interventions in hopes of further evaluation of the neurologic standing for Danny Carpenter.     This NP visited patient at the bedside as a follow up as scheduled  for palliative medicine needs and emotional support, I met with large family again as they face treatment option decisions, advanced directive decisions  and anticipatory care needs.   I spoke with NP at [facility] program in [place], she shares with me conversation she has had with Mr Carpenter in the past reflecting importance of quality of life.   MOST form placed in hard chart for scanning.  Patient's wife, multiple children, including Angel/HPOA and several other family members gather.  We reviewed the current medical situation.  Discussed best case scenario vs worst case scenario.  Questions and concerns.  Education offered on regarding concept of overall failure to thrive when a physical body is subject to long term critical illness.   Dr Julien Girt  spoke with family, he discussed with family that he feels patient's current medical conditions  are treatable.  Several family members express confusion, not really seeing any improvement but hearing that more time will result in recovery.  This is a very difficult hard time for th family.  Family as a whole  see Mr Carpenter as "in there" and they remain hopeful that more time will serve the patient well, as he improves to baseline.  Family verbalize trust, and they seek guidance from Dr Julien Girt.  They look to him for guidance  in treatment and GOC decisions    Family understands the seriousness of the current medical/situation.  They remain open to ongoing medical interventions and are open to all offered and available medical  interventions to prolong life at this time.  Family is hopeful for improvement.  Questions and concerns addressed.  Emotional support offered.  Discussed with family  the importance of continued conversation with each other  and the  medical  providers regarding overall plan of care and treatment options,  ensuring decisions are within the context of the patients values and GOCs.  All agree that quality is of utmost importance to Danny Carpenter  PMT will continue to support holistically.  Questions and concerns addressed     Total time spent on the unit was  60 minutes  Greater than 50% of the time was spent in counseling and coordination of care  Wadie Lessen NP  Palliative Medicine Team Team Phone # [phone] Pager [phone]

## 2020-08-15 ENCOUNTER — Inpatient Hospital Stay (HOSPITAL_COMMUNITY): Payer: No Typology Code available for payment source

## 2020-08-15 ENCOUNTER — Inpatient Hospital Stay: Payer: Self-pay

## 2020-08-15 DIAGNOSIS — R509 Fever, unspecified: Secondary | ICD-10-CM

## 2020-08-15 DIAGNOSIS — J9601 Acute respiratory failure with hypoxia: Secondary | ICD-10-CM | POA: Diagnosis not present

## 2020-08-15 DIAGNOSIS — I6340 Cerebral infarction due to embolism of unspecified cerebral artery: Secondary | ICD-10-CM | POA: Diagnosis not present

## 2020-08-15 DIAGNOSIS — I5021 Acute systolic (congestive) heart failure: Secondary | ICD-10-CM | POA: Diagnosis not present

## 2020-08-15 DIAGNOSIS — R570 Cardiogenic shock: Secondary | ICD-10-CM | POA: Diagnosis not present

## 2020-08-15 DIAGNOSIS — N179 Acute kidney failure, unspecified: Secondary | ICD-10-CM | POA: Diagnosis not present

## 2020-08-15 DIAGNOSIS — Z951 Presence of aortocoronary bypass graft: Secondary | ICD-10-CM | POA: Diagnosis not present

## 2020-08-15 DIAGNOSIS — E1169 Type 2 diabetes mellitus with other specified complication: Secondary | ICD-10-CM

## 2020-08-15 DIAGNOSIS — I5043 Acute on chronic combined systolic (congestive) and diastolic (congestive) heart failure: Secondary | ICD-10-CM | POA: Diagnosis not present

## 2020-08-15 DIAGNOSIS — I509 Heart failure, unspecified: Secondary | ICD-10-CM | POA: Diagnosis not present

## 2020-08-15 DIAGNOSIS — Z930 Tracheostomy status: Secondary | ICD-10-CM | POA: Diagnosis not present

## 2020-08-15 LAB — GLUCOSE, CAPILLARY
Glucose-Capillary: 126 mg/dL — ABNORMAL HIGH (ref 70–99)
Glucose-Capillary: 130 mg/dL — ABNORMAL HIGH (ref 70–99)
Glucose-Capillary: 151 mg/dL — ABNORMAL HIGH (ref 70–99)
Glucose-Capillary: 162 mg/dL — ABNORMAL HIGH (ref 70–99)
Glucose-Capillary: 170 mg/dL — ABNORMAL HIGH (ref 70–99)
Glucose-Capillary: 210 mg/dL — ABNORMAL HIGH (ref 70–99)
Glucose-Capillary: 215 mg/dL — ABNORMAL HIGH (ref 70–99)
Glucose-Capillary: 220 mg/dL — ABNORMAL HIGH (ref 70–99)

## 2020-08-15 LAB — CBC
HCT: 30 % — ABNORMAL LOW (ref 39.0–52.0)
Hemoglobin: 9.3 g/dL — ABNORMAL LOW (ref 13.0–17.0)
MCH: 28.8 pg (ref 26.0–34.0)
MCHC: 31 g/dL (ref 30.0–36.0)
MCV: 92.9 fL (ref 80.0–100.0)
Platelets: 266 10*3/uL (ref 150–400)
RBC: 3.23 MIL/uL — ABNORMAL LOW (ref 4.22–5.81)
RDW: 18.9 % — ABNORMAL HIGH (ref 11.5–15.5)
WBC: 9.7 10*3/uL (ref 4.0–10.5)
nRBC: 0 % (ref 0.0–0.2)

## 2020-08-15 LAB — CALCIUM, IONIZED: Calcium, Ionized, Serum: 4.3 mg/dL — ABNORMAL LOW (ref 4.5–5.6)

## 2020-08-15 LAB — BASIC METABOLIC PANEL
Anion gap: 13 (ref 5–15)
BUN: 80 mg/dL — ABNORMAL HIGH (ref 8–23)
CO2: 26 mmol/L (ref 22–32)
Calcium: 7.7 mg/dL — ABNORMAL LOW (ref 8.9–10.3)
Chloride: 100 mmol/L (ref 98–111)
Creatinine, Ser: 4.89 mg/dL — ABNORMAL HIGH (ref 0.61–1.24)
GFR, Estimated: 12 mL/min — ABNORMAL LOW (ref >60)
Glucose, Bld: 229 mg/dL — ABNORMAL HIGH (ref 70–99)
Potassium: 3.5 mmol/L (ref 3.5–5.1)
Sodium: 139 mmol/L (ref 135–145)

## 2020-08-15 LAB — LIDOCAINE LEVEL: Lidocaine Lvl: 1.3 ug/mL — ABNORMAL LOW (ref 1.5–5.0)

## 2020-08-15 LAB — COOXEMETRY PANEL
Carboxyhemoglobin: 1.4 % (ref 0.5–1.5)
Carboxyhemoglobin: 1.4 % (ref 0.5–1.5)
Methemoglobin: 1.1 % (ref 0.0–1.5)
Methemoglobin: 1.2 % (ref 0.0–1.5)
O2 Saturation: 75.3 %
O2 Saturation: 66.2 %
Total hemoglobin: 11.4 g/dL — ABNORMAL LOW (ref 12.0–16.0)
Total hemoglobin: 9.1 g/dL — ABNORMAL LOW (ref 12.0–16.0)

## 2020-08-15 LAB — HEPARIN LEVEL (UNFRACTIONATED)
Heparin Unfractionated: 0.39 IU/mL (ref 0.30–0.70)
Heparin Unfractionated: 0.39 IU/mL (ref 0.30–0.70)

## 2020-08-15 LAB — ECHOCARDIOGRAM LIMITED
Height: 73 in
Weight: 3894.21 oz

## 2020-08-15 LAB — LACTATE DEHYDROGENASE: LDH: 468 U/L — ABNORMAL HIGH (ref 98–192)

## 2020-08-15 MED ORDER — IOHEXOL 9 MG/ML PO SOLN
ORAL | Status: AC
  Filled 2020-08-15: qty 1000

## 2020-08-15 MED ORDER — INSULIN ASPART 100 UNIT/ML IJ SOLN
3.0000 [IU] | INTRAMUSCULAR | Status: DC
  Administered 2020-08-15 – 2020-08-17 (×7): 3 [IU] via SUBCUTANEOUS

## 2020-08-15 MED ORDER — ROSUVASTATIN CALCIUM 5 MG PO TABS
10.0000 mg | ORAL_TABLET | Freq: Every day | ORAL | Status: DC
  Administered 2020-08-15 – 2020-08-22 (×8): 10 mg
  Filled 2020-08-15 (×8): qty 2

## 2020-08-15 MED ORDER — POTASSIUM CHLORIDE 20 MEQ PO PACK
60.0000 meq | PACK | Freq: Once | ORAL | Status: AC
  Administered 2020-08-15: 60 meq
  Filled 2020-08-15: qty 3

## 2020-08-15 MED ORDER — POTASSIUM CHLORIDE 20 MEQ PO PACK: 40.0000 meq | PACK | Freq: Once | ORAL | Status: DC

## 2020-08-15 MED ORDER — FUROSEMIDE 10 MG/ML IJ SOLN
40.0000 mg | Freq: Once | INTRAMUSCULAR | Status: AC
  Administered 2020-08-15: 40 mg via INTRAVENOUS
  Filled 2020-08-15: qty 4

## 2020-08-15 MED ORDER — DEXTROSE 10 % IV SOLN: INTRAVENOUS | Status: AC

## 2020-08-15 NOTE — Progress Notes (Signed)
[place] for Heparin Indication: Impella 5.5  No Known Allergies  Patient Measurements: Height: '6\' 1"'$  (185.4 cm) Weight: 110.4 kg (243 lb 6.2 oz) IBW/kg (Calculated) : 79.9 Heparin Dosing Weight: 102.8 kg  Vital Signs: Temp: 99.5 F (37.5 C) (05/17 1700) Temp Source: Bladder (05/17 1600) BP: 111/56 (05/17 1700) Pulse Rate: 89 (05/17 1700)  Labs: Recent Labs    08/13/20 1526 08/13/20 1607 08/14/20 0428 08/14/20 1650 08/15/20 0413 08/15/20 1632  HGB 9.2*  --  9.3*  --  9.3*  --   HCT 28.9*  --  29.3*  --  30.0*  --   PLT 303  --  284  --  266  --   HEPARINUNFRC  --    < > 0.37 0.28* 0.39 0.39  CREATININE 4.87*  --  4.84*  --  4.89*  --    < > = values in this interval not displayed.    Estimated Creatinine Clearance: 18.8 mL/min (A) (by C-G formula based on SCr of 4.89 mg/dL (H)).   Assessment: 70 yo M presents with NSTEMI and multivessel CAD, now s/p CABG with Impella support. Pharmacy asked to manage systemic heparin.  Heparin level 0.39, slightly above goal, CBC stable, LDH up.  Goal of Therapy:  Heparin level 0.2-0.3 units/ml Monitor platelets by anticoagulation protocol: Yes   Plan:  Contine heparin purge solution (50 units/ml) Reduce IV heparin to 1150 units/h q12 labs at [place], PharmD, [place] Pharmacist Please see AMION for all Pharmacists' Contact Phone Numbers 08/15/2020, 5:38 PM

## 2020-08-15 NOTE — Progress Notes (Signed)
[place] for Heparin Indication: Impella 5.5  No Known Allergies  Patient Measurements: Height: '6\' 1"'$  (185.4 cm) Weight: 110.4 kg (243 lb 6.2 oz) IBW/kg (Calculated) : 79.9 Heparin Dosing Weight: 102.8 kg  Vital Signs: Temp: 100.4 F (38 C) (05/17 0645) BP: 119/69 (05/17 0630) Pulse Rate: 90 (05/17 0645)  Labs: Recent Labs    08/13/20 1526 08/13/20 1607 08/14/20 0428 08/14/20 1650 08/15/20 0413  HGB 9.2*  --  9.3*  --  9.3*  HCT 28.9*  --  29.3*  --  30.0*  PLT 303  --  284  --  266  HEPARINUNFRC  --    < > 0.37 0.28* 0.39  CREATININE 4.87*  --  4.84*  --  4.89*   < > = values in this interval not displayed.    Estimated Creatinine Clearance: 18.8 mL/min (A) (by C-G formula based on SCr of 4.89 mg/dL (H)).   Assessment: 70 yo M presents with NSTEMI and multivessel CAD, now s/p CABG with Impella support. Pharmacy asked to manage systemic heparin.  Heparin level 0.39, slightly above goal, CBC stable, LDH up.  Goal of Therapy:  Heparin level 0.2-0.3 units/ml Monitor platelets by anticoagulation protocol: Yes   Plan:  Contine heparin purge solution (50 units/ml) Reduce IV heparin to 1200 units/h q12 labs at [place], PharmD, BCPS, [place] Pharmacist [phone] Please check AMION for all [place] numbers 08/15/2020

## 2020-08-15 NOTE — Progress Notes (Signed)
Impella alarming retrograde flow alarm. Dr Orvan Seen at the bedside for PM rounds. Increased P-level to P6 temporarily and restarted sedation. Order received once MAP is in the 80's to decrease P-level back to P3.

## 2020-08-15 NOTE — Plan of Care (Signed)
  Problem: Clinical Measurements: Goal: Ability to maintain clinical measurements within normal limits will improve Outcome: Progressing   Problem: Nutrition: Goal: Adequate nutrition will be maintained Outcome: Progressing   Problem: Elimination: Goal: Will not experience complications related to bowel motility Outcome: Progressing   Problem: Safety: Goal: Ability to remain free from injury will improve Outcome: Progressing   Problem: Respiratory: Goal: Ability to maintain a clear airway and adequate ventilation will improve Outcome: Progressing

## 2020-08-15 NOTE — Progress Notes (Addendum)
Patient ID: Danny Carpenter, male   DOB: Aug 06, 1950, 70 y.o.   MRN: [id number]     Advanced Heart Failure Rounding Note  PCP-Cardiologist: None   Subjective:    - 4/26 S/P CABG - 5/4 Swan removed with ongoing fevers, CVVH started - CT head on 5/6 with multiple posterior circulation infarcts.  - 5/10 tracheostomy. CT head similar to prior with multiple small infarcts.  - 5/14 Torsades -> paced out by Dr. Orvan Seen. Impella turned back up from P3 -> P7  after code. Amio gtt switched to lidocaine.   - 5/16 Eraxis added for persistent fevers. Cultures resent>>pending   Rhythm stable. No further VT. A-V paced. Now off lidocaine and on Mexiletine.    Remains febrile, mTemp overnight 101.6. Repeat BC NGTD.   Impella now at P-4. Off NE . On DBA 2.5.  Co-ox 75%.   Has been off CRRT since 5/13 for HD holiday. Made about 2.1 L of urine yesterday. CVP 10-11.  SCr stable, 4.89.   CXR done today. Interpretation pending.   Impella P4 Flow 2.1 Waveforms ok LDH 290>>468  Position of Impella stable on 5/12 echo.   Objective:   Weight Range: 110.4 kg Body mass index is 32.11 kg/m.   Vital Signs:   Temp:  [99.86 F (37.7 C)-103.1 F (39.5 C)] 100.4 F (38 C) (05/17 0645) Pulse Rate:  [86-102] 90 (05/17 0645) Resp:  [5-27] 23 (05/17 0645) BP: (104-156)/(61-85) 119/69 (05/17 0630) SpO2:  [97 %-100 %] 100 % (05/17 0645) Arterial Line BP: (92-169)/(-38-103) 142/68 (05/17 0645) FiO2 (%):  [40 %] 40 % (05/17 0425) Last BM Date: 08/14/20  Weight change: Filed Weights   08/10/20 0458 08/11/20 0500 08/14/20 0600  Weight: 112 kg 112.2 kg 110.4 kg    Intake/Output:   Intake/Output Summary (Last 24 hours) at 08/15/2020 0729 Last data filed at 08/15/2020 0700 Gross per 24 hour  Intake 2763.46 ml  Output 2370 ml  Net 393.46 ml      Physical Exam   CVP 6-7 General:  Critically ill, on vent through trach  HEENT: normal Neck: supple. LIJ HD cath. + Lf axillary impella Carotids 2+ bilat;  no bruits. No lymphadenopathy or thryomegaly appreciated. Cor: Impella site ok. RRR. No rubs, gallops or murmurs.  Lungs: coarse bilaterally. No wheezing Abdomen: soft, nontender, nondistended. No hepatosplenomegaly. No bruits or masses. Good bowel sounds. Extremities: no cyanosis, clubbing, rash, trace bilateral LE edema, left TMA Neuro: moves bilateral upper extremities. Not following commands   Telemetry    AV pacing 90s Personally reviewed  Labs    CBC Recent Labs    08/12/20 0738 08/13/20 0306 08/13/20 1526 08/14/20 0428 08/15/20 0413  WBC 10.2   < > 8.6 8.0 9.7  NEUTROABS 7.4  --  5.9  --   --   HGB 7.5*   < > 9.2* 9.3* 9.3*  HCT 24.4*   < > 28.9* 29.3* 30.0*  MCV 92.1   < > 89.5 91.0 92.9  PLT 306   < > 303 284 266   < > = values in this interval not displayed.   Basic Metabolic Panel Recent Labs    08/12/20 1521 08/13/20 0306 08/13/20 0326 08/13/20 1526 08/14/20 0428 08/15/20 0413  NA 138  139  139  --  140 138 137 139  K 3.8  3.8  3.8  --  3.3* 3.3* 3.2* 3.5  CL 89*  90*  90*  --  90* 92* 96* 100  CO2 34*  34*  35*  --  33* _0 GLUCOSE 225*  224*  225*  --  125* 95 142* 229*  BUN 53*  52*  53*  --  62* 69* 72* 80*  CREATININE 4.00*  3.88*  3.94*  --  4.33* 4.87* 4.84* 4.89*  CALCIUM 8.0*  8.1*  8.1*  --  8.3* 7.9* 7.9* 7.7*  MG  --  2.8*  --  2.5*  --   --   PHOS 5.0*  --  5.0*  --   --   --    Liver Function Tests Recent Labs    08/12/20 1521 08/13/20 0326  AST 81*  --   ALT 60*  --   ALKPHOS 155*  --   BILITOT 1.0  --   PROT 6.0*  --   ALBUMIN 2.0*  2.0* 2.0*   No results for input(s): LIPASE, AMYLASE in the last 72 hours. Cardiac Enzymes No results for input(s): CKTOTAL, CKMB, CKMBINDEX, TROPONINI in the last 72 hours.  BNP: BNP (last 3 results) Recent Labs    07/12/2020 1211  BNP 465.5*    ProBNP (last 3 results) No results for input(s): PROBNP in the last 8760 hours.   D-Dimer No results for input(s):  DDIMER in the last 72 hours. Hemoglobin A1C No results for input(s): HGBA1C in the last 72 hours. Fasting Lipid Panel No results for input(s): CHOL, HDL, LDLCALC, TRIG, CHOLHDL, LDLDIRECT in the last 72 hours. Thyroid Function Tests No results for input(s): TSH, T4TOTAL, T3FREE, THYROIDAB in the last 72 hours.  Invalid input(s): FREET3  Other results:   Imaging    No results found.   Medications:     Scheduled Medications: . aspirin EC  325 mg Oral Daily   Or  . aspirin  324 mg Per Tube Daily  . chlorhexidine gluconate (MEDLINE KIT)  15 mL Mouth Rinse BID  . Chlorhexidine Gluconate Cloth  6 each Topical Daily  . darbepoetin (ARANESP) injection - NON-DIALYSIS  150 mcg Subcutaneous Q Wed-1800  . feeding supplement (PROSource TF)  45 mL Per Tube 5 X Daily  . ferrous [id number] C-folic acid  1 capsule Oral BID PC  . fiber  1 packet Per Tube BID  . Gerhardt's butt cream   Topical QID  . insulin aspart  3-9 Units Subcutaneous Q4H  . insulin detemir  29 Units Subcutaneous BID  . levalbuterol  0.63 mg Nebulization TID  . mouth rinse  15 mL Mouth Rinse 10 times per day  . mexiletine  200 mg Per Tube Q12H  . midodrine  10 mg Per Tube TID WC  . pantoprazole sodium  40 mg Per Tube Daily  . rosuvastatin  40 mg Per Tube Daily  . sodium chloride flush  10-40 mL Intracatheter Q12H    Infusions: . sodium chloride 10 mL/hr at 08/15/20 0700  . sodium chloride    . anidulafungin    . DOBUTamine 2.5 mcg/kg/min (08/15/20 0700)  . feeding supplement (VITAL 1.5 CAL) 1,000 mL (08/14/20 1811)  . fentaNYL infusion INTRAVENOUS 50 mcg/hr (08/15/20 0700)  . heparin 1,200 Units/hr (08/15/20 0718)  . impella catheter heparin 50 unit/mL in dextrose 5% 10.5 mL/hr at 08/09/20 2227  . norepinephrine (LEVOPHED) Adult infusion Stopped (08/14/20 0939)    PRN Medications: sodium chloride, Place/Maintain arterial line **AND** sodium chloride, acetaminophen (TYLENOL) oral liquid 160 mg/5 mL,  fentaNYL (SUBLIMAZE) injection, heparin, polyvinyl alcohol, sodium chloride flush  Assessment/Plan   1. Cardiogenic shock: Ischemic cardiomyopathy,  post-CABG on 4/26.  He has Impella 5.5 in place, down to P4 with no alarms and stable LDH.  Limited echo 4/29 with EF< 20%, the RV appears normal in size with severe systolic dysfunction. Now on NE 10 and dobutamine 2.5. Echo with some improvement, EF in 30-35% range on 5/9 echo. ?Component of septic/distributive shock with increased pressor requirement over the last couple days. Had torsades arrest on 5/14 and paced out. Now Full Code again. No further VT since 5/14. Impella down to P-4. On DBA 2.5. off NE. Co-ox 75%  - Continue dobutamine 2.5.  - Impella now at P-4. Timing of repeat wean per Drs. Gaye Scorza and Atkins - Heparin gtt for Impella.  - Off CVVHD currently. Had good urine output yesterday BUN/Cr fairly stable  2. CAD: s/p CABG x 5 with LIMA-LAD, seq SVG-D1/ramus, seq SVG-PDA/PLV.   - ASA - Crestor - No current s/s ischemia 3. Anemia: Post-op bleeding, back to OR with multiple products given on 4/26 post-CABG.  Hgb 9.7 - Transfuse < 7.5    4. Thrombocytopenia: Resolved. suspect low post-op/post-surgical bleeding and multiple blood products as well as sepsis.  5. PAD: Extensive history.  6. AAA: Monitoring as outpatient.  7. Type 2 DM: Insulin.  Hgb A1c was 11.6, poor control.  8. Atrial fibrillation: Rate controlled.   - Off amio with torsades and possible QT prolongation 9. Neuro: intubated and sedated, not following commands with sedation wean.  Head CT 5/6 with multiple posterior circulation infarcts. Head CT 5/10 no change.  - awake but not following commands. - Neuro following - Palliative Care following.  10. ID:  PCT 7.5 on 4/29. Tmax 101.7 Cultures so far negative.  - D/c lines as able.  - Currently off meropenem. - Eraxis added 5/16 - Pan culture and ID consult   11. Acute hypoxemic respiratory failure: S/p tracheostomy on  5/10.  ?PNA, PCT 1.19 on 5/10.  -currently off meropenem.   12. AKI:  CVVH started with marked volume overload and worsening renal function.  Avoid hypotension and excessive CVVH rates. - On CVVHD holiday - Good urine output  BUN/Cr stable today. Renal following 13. Ileus: Improved 14. Torsades - paced out by Dr Orvan Seen on 5/14 - No recurrence. Now off lidocaine gtt - continue Mexiletine  - remains A paced with intermittent capture. 74. GOC - was limited code and now changed back to [place], [place] 08/15/2020  Patient seen with PA, agree with the above note.   Off RRT, made 2110 cc urine yesterday with stable creatinine 4.89.  CVP 10-11, co-ox 75%.  He remains on dobutamine 2.5 and is off NE.  Still febrile, cultures remain negative.  Finished meropenem course today, on Eraxis.   Impella remains in place, turned down to P4 today, no alarms. LDH higher at 468 but stable platelets and hgb.   No further VT, on mexiletine.    Trach, FiO2 0.4.   Withdraws from pain.   General: Not responsive Neck: Tracheostomy, JVP 10 cm, no thyromegaly or thyroid nodule.  Lungs: Decreased at bases.  CV: Nondisplaced PMI.  Heart regular S1/S2, no S3/S4, no murmur.  1+ ankle edema.  Abdomen: Soft, nontender, no hepatosplenomegaly, no distention.  Skin: Intact without lesions or rashes.  Neurologic: Not responsive, per nursing will withdraw from pain. Extremities: No clubbing or cyanosis.  HEENT: Normal.   Continue dobutamine 2.5, decreased Impella to P4 this morning. Repeat co-ox at noon and decrease to P3 if remains adequate.  LDH higher but plts and hgb stable, hopefully does not signify Impella-related hemolysis.  Will get limited echo for position today.  If he weans successfully today, plan to remove Impella tomorrow.   Good UOP, BUN/creatinine fairly stable.  CVP 10-11.  Will give Lasix 40 mg IV x 1 today and replace K.  ?Some degree of renal recovery.   Febrile  still, has completed meropenem course.  Cultures negative.  Remains on Eraxis.   - CT chest/abd/pelvis. - ID consult. - Hopefully Impella out tomorrow.    Still difficult to tell neurological status, withdraws from pain per nursing.  Will get CT head again today when goes down for other CTs.   CRITICAL CARE Performed by: Loralie Champagne  Total critical care time: 35 minutes  Critical care time was exclusive of separately billable procedures and treating other patients.  Critical care was necessary to treat or prevent imminent or life-threatening deterioration.  Critical care was time spent personally by me on the following activities: development of treatment plan with patient and/or surrogate as well as nursing, discussions with consultants, evaluation of patient's response to treatment, examination of patient, obtaining history from patient or surrogate, ordering and performing treatments and interventions, ordering and review of laboratory studies, ordering and review of radiographic studies, pulse oximetry and re-evaluation of patient's condition.   Loralie Champagne 08/15/2020 8:04 AM

## 2020-08-15 NOTE — Progress Notes (Signed)
Ventilator patient transported from 2H13 to CT3 and back without any complications.

## 2020-08-15 NOTE — Progress Notes (Signed)
21 Days Post-Op Procedure(s) (LRB): EXPLORATION POST OPERATIVE OPEN HEART (N/A) Subjective: sedated  Objective: Vital signs in last 24 hours: Temp:  [99.86 F (37.7 C)-103.1 F (39.5 C)] 100.4 F (38 C) (05/17 0645) Pulse Rate:  [86-102] 90 (05/17 0645) Cardiac Rhythm: A-V Sequential paced (05/16 2000) Resp:  [5-27] 23 (05/17 0645) BP: (104-156)/(61-85) 119/69 (05/17 0630) SpO2:  [97 %-100 %] 100 % (05/17 0645) Arterial Line BP: (92-169)/(-38-103) 142/68 (05/17 0645) FiO2 (%):  [40 %] 40 % (05/17 0425)  Hemodynamic parameters for last 24 hours: CVP:  [9 mmHg-17 mmHg] 9 mmHg  Intake/Output from previous day: 05/16 0701 - 05/17 0700 In: 2763.5 [I.V.:585.2; NG/GT:1362.3; IV Piggyback:567] Out: 2370 [Urine:2110; Emesis/NG output:160; Stool:100] Intake/Output this shift: No intake/output data recorded.  General appearance: no distress Neurologic: responding to light touch Heart: regular rate and rhythm, S1, S2 normal, no murmur, click, rub or gallop Lungs: clear to auscultation bilaterally Abdomen: soft, non-tender; bowel sounds normal; no masses,  no organomegaly Extremities: edema 1+ Wound: c/d/i  Lab Results: Recent Labs    08/14/20 0428 08/15/20 0413  WBC 8.0 9.7  HGB 9.3* 9.3*  HCT 29.3* 30.0*  PLT 284 266   BMET:  Recent Labs    08/14/20 0428 08/15/20 0413  NA 137 139  K 3.2* 3.5  CL 96* 100  CO2 27 26  GLUCOSE 142* 229*  BUN 72* 80*  CREATININE 4.84* 4.89*  CALCIUM 7.9* 7.7*    PT/INR: No results for input(s): LABPROT, INR in the last 72 hours. ABG    Component Value Date/Time   PHART 7.598 (H) 08/11/2020 0356   HCO3 51.2 (H) 08/11/2020 0356   TCO2 43 (H) 08/11/2020 0742   ACIDBASEDEF 2.0 08/05/2020 0400   O2SAT 75.3 08/15/2020 0413   CBG (last 3)  Recent Labs    08/15/20 0006 08/15/20 0413 08/15/20 0427  GLUCAP 170* 215* 210*    Assessment/Plan: S/P Procedure(s) (LRB): EXPLORATION POST OPERATIVE OPEN HEART (N/A) HD plan per nephro   Impella to P4 ID consult Consider pan-CT scan for FUO   LOS: 26 days    Wonda Olds 08/15/2020

## 2020-08-15 NOTE — Progress Notes (Signed)
Admit: 07/12/2020 LOS: 45  60M progressive AKI after CABG with prolonged shock (cardiogenic and ? Vasodilatory/septic), failure to achieve target volume status despite aggressive diuretics.    Catheter: right IJ Temp IJ CCM placed 5/14  S:  Persistent fever, UOP 2.1L.  On dobutamine only.  Plans to wean Impella.  Working up fever extensively by primary team today.   O: 05/16 0701 - 05/17 0700 In: 2763.5 [I.V.:585.2; BW/LS:9373.4; IV Piggyback:567] Out: 2370 [Urine:2110; Emesis/NG output:160; Stool:100]  Vitals:   08/15/20 0845 08/15/20 0900  BP:  130/67  Pulse: 89 80  Resp: (!) 25 (!) 25  Temp: (!) 101.3 F (38.5 C) (!) 101.48 F (38.6 C)  SpO2: 100% 100%     PE Eyes lying in bed in no distress sedated Lungs-bilateral chest rise, ventilated CV-normal rate, sternotomy scar Abd-  Distended, soft Ext-minimal pitting edema  Filed Weights   08/10/20 0458 08/11/20 0500 08/14/20 0600  Weight: 112 kg 112.2 kg 110.4 kg    Recent Labs  Lab 08/12/20 0252 08/12/20 0738 08/12/20 1521 08/13/20 0326 08/13/20 1526 08/14/20 0428 08/15/20 0413  NA 137   < > 138  139  139 140 138 137 139  K 3.2*   < > 3.8  3.8  3.8 3.3* 3.3* 3.2* 3.5  CL 87*   < > 89*  90*  90* 90* 92* 96* 100  CO2 37*   < > 34*  34*  35* 33* 29 27 26   GLUCOSE 186*   < > 225*  224*  225* 125* 95 142* 229*  BUN 42*   < > 53*  52*  53* 62* 69* 72* 80*  CREATININE 3.34*   < > 4.00*  3.88*  3.94* 4.33* 4.87* 4.84* 4.89*  CALCIUM 8.3*   < > 8.0*  8.1*  8.1* 8.3* 7.9* 7.9* 7.7*  PHOS 3.2  --  5.0* 5.0*  --   --   --    < > = values in this interval not displayed.   Recent Labs  Lab 08/12/20 0738 08/13/20 0306 08/13/20 1526 08/14/20 0428 08/15/20 0413  WBC 10.2   < > 8.6 8.0 9.7  NEUTROABS 7.4  --  5.9  --   --   HGB 7.5*   < > 9.2* 9.3* 9.3*  HCT 24.4*   < > 28.9* 29.3* 30.0*  MCV 92.1   < > 89.5 91.0 92.9  PLT 306   < > 303 284 266   < > = values in this interval not displayed.     Scheduled Meds: . aspirin EC  325 mg Oral Daily   Or  . aspirin  324 mg Per Tube Daily  . chlorhexidine gluconate (MEDLINE KIT)  15 mL Mouth Rinse BID  . Chlorhexidine Gluconate Cloth  6 each Topical Daily  . darbepoetin (ARANESP) injection - NON-DIALYSIS  150 mcg Subcutaneous Q Wed-1800  . feeding supplement (PROSource TF)  45 mL Per Tube 5 X Daily  . ferrous [id number] C-folic acid  1 capsule Oral BID PC  . fiber  1 packet Per Tube BID  . Gerhardt's butt cream   Topical QID  . insulin aspart  3-9 Units Subcutaneous Q4H  . insulin detemir  29 Units Subcutaneous BID  . iohexol      . levalbuterol  0.63 mg Nebulization TID  . mouth rinse  15 mL Mouth Rinse 10 times per day  . mexiletine  200 mg Per Tube Q12H  . midodrine  10 mg  Per Tube TID WC  . pantoprazole sodium  40 mg Per Tube Daily  . rosuvastatin  10 mg Per Tube Daily  . sodium chloride flush  10-40 mL Intracatheter Q12H   Continuous Infusions: . sodium chloride Stopped (08/15/20 0833)  . sodium chloride    . anidulafungin 78 mL/hr at 08/15/20 0900  . DOBUTamine 2.5 mcg/kg/min (08/15/20 0900)  . feeding supplement (VITAL 1.5 CAL) 1,000 mL (08/14/20 1811)  . fentaNYL infusion INTRAVENOUS Stopped (08/15/20 0720)  . heparin 1,200 Units/hr (08/15/20 0900)  . impella catheter heparin 50 unit/mL in dextrose 5% 10.5 mL/hr at 08/09/20 2227  . norepinephrine (LEVOPHED) Adult infusion Stopped (08/14/20 0939)   PRN Meds:.sodium chloride, Place/Maintain arterial line **AND** sodium chloride, acetaminophen (TYLENOL) oral liquid 160 mg/5 mL, fentaNYL (SUBLIMAZE) injection, heparin, polyvinyl alcohol, sodium chloride flush  ABG    Component Value Date/Time   PHART 7.598 (H) 08/11/2020 0356   PCO2ART 52.8 (H) 08/11/2020 0356   PO2ART 127 (H) 08/11/2020 0356   HCO3 51.2 (H) 08/11/2020 0356   TCO2 43 (H) 08/11/2020 0742   ACIDBASEDEF 2.0 08/05/2020 0400   O2SAT 75.3 08/15/2020 0413    A/P  1. Dialysis dependent AKI   secondary to  cardiorenal syndrome probably some ATN s/p CABG.  Required CRRT from 5/3 to 5/13.  BL Crt <2.  Creatinine and BUN essentially stable.  Good urine output.  Improving volume status.  Continue to hold CRRT for now.  Primary team to trial Lasix today.  May need high-dose given his decreased GFR 2. Acute systolic CHF / ICM; impella but weaning,  Currently on dobut-CVP acceptable.  Management per primary.  Urine output adequate.  Could consider Lasix if needed 3. CAD s/p  5V CABG 4/26 4. Anemia-ESA on board-  Transfuse PRN-  Given one unit on 5/12 5. DM2 6. PAD hx/o Fem Pop Bypass 7. VDRF -remains ventilated 8. Numerous ischemic bilateral posterior CVAs, neurology following-- this is the major issue 9. Fevers: Extensive fevers.  Working up and involving ID by primary team today.   agree with goals of care conversation, grim prognosis given his prolonged neurological insult.  Partial code-  But proceeding with peg and trach.   if he survives and remains RRT dep his only long term dispo option is [place], [place]  [employer]

## 2020-08-15 NOTE — Progress Notes (Signed)
  Echocardiogram 2D Echocardiogram limited has been performed.  Danny Carpenter M 08/15/2020, 10:20 AM

## 2020-08-15 NOTE — Progress Notes (Signed)
Inpatient Diabetes Program Recommendations  AACE/ADA: New Consensus Statement on Inpatient Glycemic Control   Target Ranges:  Prepandial:   less than 140 mg/dL      Peak postprandial:   less than 180 mg/dL (1-2 hours)      Critically ill patients:  140 - 180 mg/dL   Results for LADARRIOUS, VU (MRN [id number]) as of 08/15/2020 11:02  Ref. Range 08/14/2020 08:37 08/14/2020 12:48 08/14/2020 15:02 08/14/2020 16:57 08/14/2020 19:47 08/15/2020 00:06 08/15/2020 04:13 08/15/2020 04:27 08/15/2020 07:59  Glucose-Capillary Latest Ref Range: 70 - 99 mg/dL 117 (H) 139 (H) 102 (H) 94 102 (H) 170 (H) 215 (H) 210 (H) 220 (H)   Review of Glycemic Control  Current orders for Inpatient glycemic control: Levemir 29 units BID, Novolog 3-9 units Q4H; Vital @ 55 ml/hr  Inpatient Diabetes Program Recommendations:    Insulin:  Please consider ordering Novolog 5 units Q4H for tube feeding coverage. If tube feeding is stopped or held then Novolog tube feeding coverage should also be stopped or held.  Thanks, Barnie Alderman, RN, MSN, CDE Diabetes Coordinator Inpatient Diabetes Program [phone] (Team Pager from 8am to 5pm)

## 2020-08-15 NOTE — Progress Notes (Signed)
    Progress Note from the Palliative Medicine Team at [facility]   Patient Name: Danny Carpenter        Date: 08/15/2020 DOB: 04/22/1950  Age: 70 y.o. MRN#: [id number] Attending Physician: Wonda Olds, MD Primary Care Physician: Serita Grammes, MD Admit Date: 07/29/2020   Medical records reviewed   70 y.o. male  admitted on 07/24/2020 with  past medical history significant for hypertension, hyperlipidemia, type 2 diabetes, CVA, AAA, left foot amputation s/p fem pop bypass and heart failure who presented to the emergency department 4/21 after undergoing outpatient stress test that revealed EF of 20% and acute ischemia.  Since admission patient was evaluated by cardiology and cardiothoracic surgery and patient underwent TEE and left heart cath. LHC/RHC revealed severe three-vessel coronary artery disease with moderately elevated left heart and pulmonary artery pressures. At that time patient was placed on goal-directed medical therapy for acute systolic congestive heart failure and CTS surgery was consulted for CABG potential.  Patient underwent CABG x5 with Dr. Orvan Seen 4/26, post CABG CODE BLUE called due to low flow on Impella, CTS notified. Patient underwent bedside exploratory postoperative open heart where Dr. Julien Girt reopened the chest incision and hematoma was removed, patient's hemodynamics improved upon opening sternotomy.  PCCM consulted 4/28 due to difficulty weaning ventilator.  He was trached  08-07-20  CRRT initiated on 08/01/2020, currently on hold.       Head CT on 08/04/2020 significant for multiple areas of posterior circulation infarct.  08-12-20 30 minutes in [place] until paced out, code status reversed  08-14-20 persistent fevers.  ID consulted, pan CT.  Creatine today 4.89.  Minimally responsive to verbal stimuli   Decision made last week with  large family meeting to proceed with trach,, PEG and continued current medical interventions in hopes  improvement.  Spoke to daughter in law/ [place] by telephone.  Updated on current medical situation and answered questions to the best of my ability.  Family understands the seriousness of the current medical/situation.  They remain open to ongoing medical interventions and are open to all offered and available medical interventions to prolong life at this time.  Family is hopeful for improvement.  Discussed with Joelene Millin   the importance of continued conversation with family   and the  medical providers regarding overall plan of care and treatment options,  ensuring decisions are within the context of the patients values and GOCs and best interest.  PMT will continue to support holistically.   Total time spent on the unit was  35 minutes  Greater than 50% of the time was spent in counseling and coordination of care  Wadie Lessen NP  Palliative Medicine Team Team Phone # [phone] Pager [phone]

## 2020-08-15 NOTE — Progress Notes (Signed)
Patient ID: Danny Carpenter, male   DOB: 1950-06-17, 70 y.o.   MRN: [id number]  TCTS Evening Rounds:   Hemodynamically stable on dobut 2.5. Impella decreased to P3  Fever to 101.5 this am. 99 this pm. Seen by ID. On Merrem and Eraxis.  Remains on vent Reportedly follows commands.  Urine output good    CBC    Component Value Date/Time   WBC 9.7 08/15/2020 0413   RBC 3.23 (L) 08/15/2020 0413   HGB 9.3 (L) 08/15/2020 0413   HCT 30.0 (L) 08/15/2020 0413   PLT 266 08/15/2020 0413   MCV 92.9 08/15/2020 0413   MCH 28.8 08/15/2020 0413   MCHC 31.0 08/15/2020 0413   RDW 18.9 (H) 08/15/2020 0413   LYMPHSABS 1.2 08/13/2020 1526   MONOABS 0.9 08/13/2020 1526   EOSABS 0.5 08/13/2020 1526   BASOSABS 0.1 08/13/2020 1526     BMET    Component Value Date/Time   NA 139 08/15/2020 0413   K 3.5 08/15/2020 0413   CL 100 08/15/2020 0413   CO2 26 08/15/2020 0413   GLUCOSE 229 (H) 08/15/2020 0413   BUN 80 (H) 08/15/2020 0413   CREATININE 4.89 (H) 08/15/2020 0413   CALCIUM 7.7 (L) 08/15/2020 0413   GFRNONAA 12 (L) 08/15/2020 0413   GFRAA >60 08/08/2019 0352     A/P:  Stable day.  Continue current plans

## 2020-08-15 NOTE — Progress Notes (Signed)
NAMEJakobi Carpenter, MRN:  [id number], DOB:  [birth year], LOS: 2 ADMISSION DATE:  07/24/2020, CONSULTATION DATE: 07/28/2018 REFERRING MD: Dr. Orvan Seen, CHIEF COMPLAINT: Ventilator dependent  History of Present Illness:  Danny Carpenter is a 70 year old male with past medical history significant for hypertension, hyperlipidemia, type 2 diabetes, CVA, AAA, left foot amputation s/p fem pop bypass and heart failure who presented to the emergency department 4/21 after undergoing outpatient stress test that revealed EF of 20% and acute ischemia.  Since admission patient has been evaluated by cardiology and cardiothoracic surgery and patient underwent TEE and left heart cath.  LHC/RHC revealed severe three-vessel coronary artery disease with moderately elevated left heart and pulmonary artery pressures.  At that time patient was placed on goal-directed medical therapy for acute systolic congestive heart failure and CTS surgery was consulted for CABG potential.  Patient underwent CABG x5 with Dr. Orvan Seen 4/26, post CABG CODE BLUE called due to low flow on Impella, CTS notified.  Patient underwent bedside exploratory postoperative open heart where Dr. Julien Girt reopened the chest incision and hematoma was removed, patient's hemodynamics improved upon opening sternotomy.  PCCM consulted morning of 4/28 due to difficulty weaning ventilator  Pertinent  Medical History  Hypertension, hyperlipidemia, type 2 diabetes, CVA, AAA, left foot amputation s/p fem pop bypass and heart failure   Significant Hospital Events: Including procedures, antibiotic start and stop dates in addition to other pertinent events   . 4/21 presented to the ED for evaluation of abnormal stress test . 4/22 left and right heart cath severe multivessel disease . 4/26 CABG x5 with hemodynamic instability post requiring brief CPR with eventual return back to the OR anastomosis bleeding was seen . 4/28 remains ventilator dependent, slowly weaning  PEEP . 5/1 remains on vent, diuresing, no ready to come off  . 5/3 started on CRRT. . 5/5 requiring sedation for vent  . 5/6 CT scan shows multiple areas of posterior circulation infarction. . 5/8 tolerating CRRT with fluid removal.  Decreasing vasopressor requirements. . 5/12 TCT today . 5/13 VT event, self converted, febrile . 5/14 spent ~30 mins in Torsades until paced out, code status reversed by Dr. Julien Girt, patient spent 30 mins with no pulsatility and no flow except for the impella.  . 5/16 starting eraxis for persistent fevers . 5/17 remains febrile; planning to pan-CT  Interim History / Subjective:  Remains febrile overnight. Not responsive.  Objective   Blood pressure 119/69, pulse 90, temperature (!) 100.4 F (38 C), resp. rate (!) 23, height '6\' 1"'$  (1.854 m), weight 110.4 kg, SpO2 100 %. CVP:  [9 mmHg-17 mmHg] 9 mmHg  Vent Mode: PRVC FiO2 (%):  [40 %] 40 % Set Rate:  [20 bmp] 20 bmp Vt Set:  [630 mL] 630 mL PEEP:  [5 cmH20] 5 cmH20 Plateau Pressure:  [18 [id number] cmH20] 20 cmH20   Intake/Output Summary (Last 24 hours) at 08/15/2020 0729 Last data filed at 08/15/2020 0700 Gross per 24 hour  Intake 2763.46 ml  Output 2370 ml  Net 393.46 ml   Filed Weights   08/10/20 0458 08/11/20 0500 08/14/20 0600  Weight: 112 kg 112.2 kg 110.4 kg   I/O +394, net -16.8L for admission  Examination: General: critically ill appearing man lying in bed in NAD HEENT: [name], conjunctival injection, dressings covering eyes Neck: trach w/o bleeding or erythema surrounding it Neuro: less nonpurposeful movement today, not tracking to verbal stimulation, not following commands. CV: S1S2, paced rhythm PULM: synchronous with MV, rhonchi cleared with suctioning,  minimal thin secretions. GI: obese, soft Extremities: BLE edema, mid-foot amputation on the left. Red first toe on the right with onychomycosis. Skin: no rashes, no significant bruising.   Labs/imaging that I have personally reviewed     5/16 blood cultures> NGTD 5/14 blood cx> NGTD  Labs reviewed Potassium 3.5 Bicarb 26 BUN 80, uptrending Creatinine 4.89, uptrending LDH 468, uptrending White blood cell count 9.7 H/H 9.3/30  coox 75.3% on impella P4, dobutamine  CXR personally reviewed> trach, impella, RIJ dialysis catheter in place. Bilateral pulmonary edema, cardiomegaly.  Resolved Hospital Problem list     Assessment & Plan:   Acute hypoxic vent- dependent  respiratory failure  Tracheostomy dependent R pleural effusion, mild pulmonary edema  Plan: -Con't LTVV -VAP prevention protocol -PAD protocol; not needing sedation -Routine trach care per protocol -daily SAT & SBT; mental status precludes extubation. Still on very significant cardiac support.  Cardiogenic shock due to ischemic cardiomyopathy CAD s/p CABG x5 -Continue Impella, weaning support.  Management per cardiology. Increased LDH does not seem to correlate with platelets or Hb, so unlikely hemolysis. -Remains on norepinephrine and dobutamine. -Continue aspirin and statin -Started mexiletine. Previously on lidocaine. -Continue monitoring on telemetry.   Acute Encephalopathy: multifactorial in setting of critical illness. Prolonged icu stay, posterior circulation CVA.  Concern for anoxic brain injury during low flow state -Unable to get brain MRI due to pacing wires for further evaluation. Head CT today. - Avoid sedating meds - Delirium precautions - Continue to monitor renal function and renally dose medications  Ongoing fevers - HD catheter and Foley exchanged -Unable to remove other devices at this time due to critical illness. - Cultures pending - con't Eraxis, add back broad spectrum antibiotics. Pan-CT today.  AKI Oliguria improved - Strict I's/Os - Continue diuretics - Renally dose meds and avoid nephrotoxic meds -con't daily monitoring  Colonic ileus -Increasing TF towards goal, monitor for intolerance.  Acute blood  loss anemia due to operative blood loss and critical illness Thrombocytopenia -resolved - Serial CBCs - Transfuse for hemoglobin less than 7 or hemodynamically significant bleeding  AAA -Supportive care, not a candidate for intervention  Hyperglycemia- uncontrolled.  History of poorly controlled diabetes. -Continue long-acting insulin + sliding scale insulin - Goal BG 140-180 while admitted to the ICU --adding TF coverage Q4h  PAD s/p partial L foot amputation  -monitor peripheral circulation -Aspirin and statin daily  Best practice   Diet:  Tube Feed   Pain/Anxiety/Delirium protocol (if indicated): Yes (RASS goal 0)  VAP protocol (if indicated): Yes DVT prophylaxis: Systemic AC - Heparin  GI prophylaxis: PPI  Fiber added for diarrhea Glucose control:  SSI Yes - SSI + basal  Central venous access:  Yes, and it is still needed, Arterial line:  Yes, and it is still needed  Foley:  Yes, and it is still needed  Mobility:  bed rest  PT consulted: N/A Last date of multidisciplinary goals of care discussion: ongoing  Code Status:  full code Disposition: ICU  This patient is critically ill with multiple organ system failure; which, requires frequent high complexity decision making, assessment, support, evaluation, and titration of therapies. This was completed through the application of advanced monitoring technologies and extensive interpretation of multiple databases. During this encounter critical care time was devoted to patient care services described in this note for 37 minutes.  Julian Hy, DO [facility] 08/15/2020 7:29 AM

## 2020-08-15 NOTE — Progress Notes (Signed)
eLink Physician-Brief Progress Note Patient Name: Danny Carpenter DOB: [birth year] MRN: [id number]   Date of Service  08/15/2020  HPI/Events of Note  Patient will have enteral nutrition on hold for 6 hours for a scheduled abdominal ultrasound, patient has severe cardiomyopathy with an EF of 20 % and CHF.  eICU Interventions  Will order D 10 % gtt at 20 ml / hour x 12 hours to minimize iv fluid load.        Frederik Pear 08/15/2020, 9:23 PM

## 2020-08-15 NOTE — Consult Note (Signed)
[facility] for Infectious Disease    Date of Admission:  07/10/2020     Total days of antibiotics 18  Cefepime through 4/26 >> 5/05  Meropenem 5/10 > 5/16  Vancomycin 4/26 >> 4/30  Eraxis 5/16 > current             Reason for Consult: Fever in critically ill patient     Referring Provider: Aundra Dubin  Primary Care Provider: Serita Grammes, MD   Assessment: Danny Carpenter is a 70 y.o. male admitted for CAD now s/p CABG x 5 with post op complicated course due to post-CPR arrest x 2, re-exploration in OR for bleeding anastomosis, respiratory failure s/p trach, cardiogenic shock requiring Impella LVAD support + inotropes, AKI requiring CRRT (currently on holiday) and recurrent fevers and leukocytosis.   It is not clear as to the cause of his fevers presently - many reasons for potential fevers including invasive lines, life-dependent medical devices, psoas hematoma, critically ill at risk for infection, surgical procedures. His leukocytosis seemed to respond in correlation with meropenem vs course of cefepime, however fevers have persisted through both courses. Blood cultures no growth, however these were drawn after 4 days of meropenem. Respiratory culture from a few weeks ago with GNR on stain but nothing growing out.  No MRSA identified anywhere on cultures.  Non-contrasted CT scan of Chest/Abdomen/Pelvis unrevealing for any intrathoracic/abdominal abscesses. He does have some mentions of abnormal gallbladder and a psoas muscle hematoma.   Recommend to remove the left [place] central line once hemodynamically able to do so. Hopefully he will get the Impella LVAD out tomorrow if he continues to do well. Reasonable to treat for another 24 hours of anidulafungin to ensure no candidemia from blood cultures drawn a few days ago since yeast is a slow grower, but I don't suspect that he has invasive fungal infection and not confident that it will help his fevers.   Hemodynamically he is  improved and would feel comfortable observing off antibiotics to get more information.    Plan: 1. Would hold off on meropenem for now 2. OK to continue eraxis tonight with reevaluation tomorrow  3. Follow pending blood cultures  4. Would remove L [place] line once able.  5. Follow fever curve      Active Problems:   Acute on chronic heart failure (HCC)   Non-ST elevation (NSTEMI) myocardial infarction [facility])   Essential hypertension   Hx of CABG   Cardiogenic shock (HCC)   Coronary artery disease involving coronary bypass graft of native heart with unstable angina pectoris (HCC)   Pleural effusion on right   AKI (acute kidney injury) ([place])   Cerebral embolism with cerebral infarction   . aspirin EC  325 mg Oral Daily   Or  . aspirin  324 mg Per Tube Daily  . chlorhexidine gluconate (MEDLINE KIT)  15 mL Mouth Rinse BID  . Chlorhexidine Gluconate Cloth  6 each Topical Daily  . darbepoetin (ARANESP) injection - NON-DIALYSIS  150 mcg Subcutaneous Q Wed-1800  . feeding supplement (PROSource TF)  45 mL Per Tube 5 X Daily  . ferrous [id number] C-folic acid  1 capsule Oral BID PC  . fiber  1 packet Per Tube BID  . Gerhardt's butt cream   Topical QID  . insulin aspart  3-9 Units Subcutaneous Q4H  . insulin detemir  29 Units Subcutaneous BID  . levalbuterol  0.63 mg Nebulization TID  . mouth rinse  15  mL Mouth Rinse 10 times per day  . mexiletine  200 mg Per Tube Q12H  . midodrine  10 mg Per Tube TID WC  . pantoprazole sodium  40 mg Per Tube Daily  . rosuvastatin  10 mg Per Tube Daily  . sodium chloride flush  10-40 mL Intracatheter Q12H    HPI: Danny Carpenter is a 70 y.o. male admitted on 4/21 with chest pain and shortness of breath from home.   History obtained from chart as the patient is ventilated and non-verbal.   PMHx including HTN, HLD, uncontrolled T2DM, remote CVA, AAA, left foot amputation 2020 2/2 osteomyelitis, PAD s/p fem-pop peripheral bypass, heart failure.    Taken for right and left heart catheterization on 4/22 - LVEF newly reduced to 24% and severe multivessel CAD.  TCTS consulted - Dr. Orvan Seen took him for high risk CABG x 5. Complicated post op course with post-CABG cardiac arrest s/p CPR, Impella placement, open chest at the bedside and return trip to OR for exploration, respiratory failure s/p tracheostomy, AKI   HF team consulted on 4/27 to assist with post op heart failure management.  PCCM also consulted for post op ventilator management.    During hospitalization he has had fevers and leukocytosis. Initially treated with vancomycin (4/26 >> 4/30) and Cefepime (4/29 >> 5/5). Initially seemed to help fever curve but then WBC trended up to > 23K on 5/06. Fevers returned on 5/10 and meropenem was added, which resolved the leukocytosis but his high fevers persisted. Anidulafungin added 5/16. Blood cultures drawn 5 days into his current antimicrobial regimen. ID consulted on 5/17 for assistance with fevers.  Sputum cutlure 4/28 with rare gram neg rods without any growth other than respiratory flora.  5/14 - code blue for torsades  Impella weaning off slowly.  CRRT on hold for now with good urine output but increased creatinine.  Non-contrasted C/A/P CT scan with psoas muscle hematoma 5.5x3.6x3.1 cm, some stranding behind the sternotomy without any fluid collection, mildly distended GB,   Invasive lines - L [place] TLC (14 days), Impella, HD catheter LIJ (placed 5/14), arterial line    Review of Systems: Review of Systems  Unable to perform ROS: Patient nonverbal    Past Medical History:  Diagnosis Date  . AAA (abdominal aortic aneurysm) without rupture ([place]) 06/11/2019   infrarenal   Last Assessment & Plan:  Uptodate.  Stable at 4.1cm Repeat visit to vascular surg in 12/2015.  Marland Kitchen Anxiety   . Arthritis   . Chronic fatigue 01/09/2015   Last Assessment & Plan:  Chronic fatigue. Suspect multifactorial including OSA, hyperglycemia (patient not  taking insulin as prescribed), pain medications. No other constitutional sx.  Will plan for repeat lab work.   - follow up in 1 mth after sleep study results.  . Chronic pain 04/05/2013   Last Assessment & Plan:   Known Degenerative disc disease and bilateral peripheral neuropathic leg pain   continue cymbalta 41m  reports has trialed injections in past as well as  566mhydrocodone with no success. Also tried tramadol in the past with no benefit.  Prescribed Norco 7.5/325mg Hydrocodone/ Acetaminophen 1-1.5 tabs every 8 hours prn #150. 1 months through 04/17/14  UTox 06/14/14 app  . COPD (chronic obstructive pulmonary disease) ([facility]ult[facility]  . CVA (cerebral vascular accident) ([facility]  . DDD (degenerative disc disease) 10/08/2004  . Depression   . Diabetic foot infection (HCVerdi10/18/2020  . Fracture of rib    Left  . GERD (  gastroesophageal reflux disease)   . HTN (hypertension)   . Hypercholesteremia   . Hyperlipidemia 06/11/2019   On Lipitor 58m per day  Last Assessment & Plan:  PLAN: HLD continue Lipitor 889mdaily  . Osteomyelitis ([facility]05/08/2019   left foot  . PAD (peripheral artery disease) (HCCalifornia10/28/2020  . Peripheral vascular disease (HCSalam[place]  . PONV (postoperative nausea and vomiting)   . PVD (peripheral vascular disease) (HCOcone[place]  . Seizure ([facility]   one time in Oct. 2020  . Senile nuclear sclerosis 12/06/2011  . Sleep apnea    no cpap  . Subacute osteomyelitis of left foot (HCCall[place]  . Type 2 diabetes mellitus with other specified complication ([facility]    Social History   Tobacco Use  . Smoking status: Former Smoker    Packs/day: 1.00    Years: 40.00    Pack years: 40.00    Types: Cigarettes    Quit date: 04/01/2009    Years since quitting: 11.3  . Smokeless tobacco: Never Used  Vaping Use  . Vaping Use: Never used  Substance Use Topics  . Alcohol use: Not Currently  . Drug use: Never    Family History  Problem Relation Age of Onset  . Cancer Mother   . Cancer Father    No  Known Allergies  OBJECTIVE: Blood pressure (!) 134/99, pulse 89, temperature 100.22 F (37.9 C), resp. rate (!) 24, height 6' 1"  (1.854 m), weight 110.4 kg, SpO2 100 %.  Physical Exam Vitals reviewed.  Constitutional:      General: He is not in acute distress.    Appearance: He is ill-appearing.     Interventions: He is intubated.     Comments: Sedated on ventilator  Neck:     Comments: Trach in place - some bloody drainage and erythema from friction at the base of cannula to chest. No open skin. No purulence.   R IJ HD trialysis catheter in place, clean. Dressing is coming off a bit from weight of device.  Cardiovascular:     Rate and Rhythm: Normal rate and regular rhythm.     Heart sounds: No murmur heard.     Comments: AV paced on telemetry Pulmonary:     Effort: Pulmonary effort is normal. He is intubated.     Breath sounds: Normal breath sounds.     Comments: No secretions in suction tubing.  Chest:     Comments: Midsternal incision healing well. No signs of dehiscence or drainage. Impella cutdown incision without signs of infection  Abdominal:     Palpations: Abdomen is soft.     Tenderness: There is no abdominal tenderness.     Comments: TF infusion through NG tube. Rectal pouch with dark brown stool   Skin:    General: Skin is warm and dry.     Capillary Refill: Capillary refill takes less than 2 seconds.     Findings: Ecchymosis (R forearm ) present.     Lab Results Lab Results  Component Value Date   WBC 9.7 08/15/2020   HGB 9.3 (L) 08/15/2020   HCT 30.0 (L) 08/15/2020   MCV 92.9 08/15/2020   PLT 266 08/15/2020    Lab Results  Component Value Date   CREATININE 4.89 (H) 08/15/2020   BUN 80 (H) 08/15/2020   NA 139 08/15/2020   K 3.5 08/15/2020   CL 100 08/15/2020   CO2 26 08/15/2020    Lab Results  Component Value Date  ALT 60 (H) 08/12/2020   AST 81 (H) 08/12/2020   ALKPHOS 155 (H) 08/12/2020   BILITOT 1.0 08/12/2020      Microbiology: Recent Results (from the past 240 hour(s))  Culture, Respiratory w Gram Stain     Status: None   Collection Time: 08/06/20  5:13 PM   Specimen: Tracheal Aspirate; Respiratory  Result Value Ref Range Status   Specimen Description TRACHEAL ASPIRATE  Final   Special Requests NONE  Final   Gram Stain   Final    ABUNDANT WBC PRESENT,BOTH PMN AND MONONUCLEAR FEW GRAM POSITIVE COCCI IN PAIRS MODERATE GRAM NEGATIVE RODS    Culture   Final    MODERATE Normal respiratory flora-no Staph aureus or Pseudomonas seen Performed at [facility], [address] [place], [place] [address]    Report Status 08/27/2020 FINAL  Final  Culture, blood (routine x 2)     Status: None (Preliminary result)   Collection Time: 08/12/20  3:20 PM   Specimen: BLOOD RIGHT HAND  Result Value Ref Range Status   Specimen Description BLOOD RIGHT HAND  Final   Special Requests   Final    BOTTLES DRAWN AEROBIC AND ANAEROBIC Blood Culture adequate volume   Culture   Final    NO GROWTH 3 DAYS Performed at [facility], [place] [address] [place], [place] [address]    Report Status PENDING  Incomplete  Culture, blood (routine x 2)     Status: None (Preliminary result)   Collection Time: 08/12/20  3:20 PM   Specimen: BLOOD LEFT HAND  Result Value Ref Range Status   Specimen Description BLOOD LEFT HAND  Final   Special Requests   Final    BOTTLES DRAWN AEROBIC AND ANAEROBIC Blood Culture adequate volume   Culture   Final    NO GROWTH 3 DAYS Performed at [facility], [place] [address], [place], [place] [address]    Report Status PENDING  Incomplete  Culture, blood (routine x 2)     Status: None (Preliminary result)   Collection Time: 08/14/20  8:55 AM   Specimen: BLOOD  Result Value Ref Range Status   Specimen Description BLOOD RIGHT ANTECUBITAL  Final   Special Requests   Final    BOTTLES DRAWN AEROBIC AND ANAEROBIC Blood Culture adequate volume   Culture   Final    NO GROWTH < 24  HOURS Performed at [facility], [place] [address], [place], [place] [address]    Report Status PENDING  Incomplete  Culture, blood (routine x 2)     Status: None (Preliminary result)   Collection Time: 08/14/20  8:55 AM   Specimen: BLOOD  Result Value Ref Range Status   Specimen Description BLOOD LEFT ANTECUBITAL  Final   Special Requests   Final    BOTTLES DRAWN AEROBIC AND ANAEROBIC Blood Culture adequate volume   Culture   Final    NO GROWTH < 24 HOURS Performed at [facility], [place] [address] [place], [place] [address]    Report Status PENDING  Incomplete     Janene Madeira, MSN, NP-C [facility] [email or web address] Pager: [phone] Office: [phone] Gerald: [phone]

## 2020-08-15 NOTE — Progress Notes (Signed)
Oral contrast given per order at 0800 and 0900. CT planned for 1000.

## 2020-08-16 ENCOUNTER — Inpatient Hospital Stay (HOSPITAL_COMMUNITY): Payer: No Typology Code available for payment source

## 2020-08-16 DIAGNOSIS — R509 Fever, unspecified: Secondary | ICD-10-CM | POA: Diagnosis not present

## 2020-08-16 DIAGNOSIS — R7989 Other specified abnormal findings of blood chemistry: Secondary | ICD-10-CM

## 2020-08-16 DIAGNOSIS — J9601 Acute respiratory failure with hypoxia: Secondary | ICD-10-CM | POA: Diagnosis not present

## 2020-08-16 DIAGNOSIS — K819 Cholecystitis, unspecified: Secondary | ICD-10-CM

## 2020-08-16 DIAGNOSIS — R609 Edema, unspecified: Secondary | ICD-10-CM | POA: Diagnosis not present

## 2020-08-16 DIAGNOSIS — L8990 Pressure ulcer of unspecified site, unspecified stage: Secondary | ICD-10-CM | POA: Insufficient documentation

## 2020-08-16 DIAGNOSIS — R570 Cardiogenic shock: Secondary | ICD-10-CM | POA: Diagnosis not present

## 2020-08-16 DIAGNOSIS — N179 Acute kidney failure, unspecified: Secondary | ICD-10-CM | POA: Diagnosis not present

## 2020-08-16 DIAGNOSIS — K810 Acute cholecystitis: Secondary | ICD-10-CM

## 2020-08-16 HISTORY — PX: IR PERC CHOLECYSTOSTOMY: IMG2326

## 2020-08-16 LAB — HEPATIC FUNCTION PANEL
ALT: 105 U/L — ABNORMAL HIGH (ref 0–44)
AST: 303 U/L — ABNORMAL HIGH (ref 15–41)
Albumin: 1.8 g/dL — ABNORMAL LOW (ref 3.5–5.0)
Alkaline Phosphatase: 517 U/L — ABNORMAL HIGH (ref 38–126)
Bilirubin, Direct: 1.1 mg/dL — ABNORMAL HIGH (ref 0.0–0.2)
Indirect Bilirubin: 0.7 mg/dL (ref 0.3–0.9)
Total Bilirubin: 1.8 mg/dL — ABNORMAL HIGH (ref 0.3–1.2)
Total Protein: 5.8 g/dL — ABNORMAL LOW (ref 6.5–8.1)

## 2020-08-16 LAB — MAGNESIUM: Magnesium: 2.3 mg/dL (ref 1.7–2.4)

## 2020-08-16 LAB — BASIC METABOLIC PANEL
Anion gap: 11 (ref 5–15)
Anion gap: 13 (ref 5–15)
BUN: 83 mg/dL — ABNORMAL HIGH (ref 8–23)
BUN: 87 mg/dL — ABNORMAL HIGH (ref 8–23)
CO2: 26 mmol/L (ref 22–32)
CO2: 24 mmol/L (ref 22–32)
Calcium: 7.6 mg/dL — ABNORMAL LOW (ref 8.9–10.3)
Calcium: 7.6 mg/dL — ABNORMAL LOW (ref 8.9–10.3)
Chloride: 103 mmol/L (ref 98–111)
Chloride: 104 mmol/L (ref 98–111)
Creatinine, Ser: 3.98 mg/dL — ABNORMAL HIGH (ref 0.61–1.24)
Creatinine, Ser: 3.87 mg/dL — ABNORMAL HIGH (ref 0.61–1.24)
GFR, Estimated: 16 mL/min — ABNORMAL LOW (ref >60)
GFR, Estimated: 16 mL/min — ABNORMAL LOW (ref >60)
Glucose, Bld: 131 mg/dL — ABNORMAL HIGH (ref 70–99)
Glucose, Bld: 50 mg/dL — ABNORMAL LOW (ref 70–99)
Potassium: 3.5 mmol/L (ref 3.5–5.1)
Potassium: 4.1 mmol/L (ref 3.5–5.1)
Sodium: 140 mmol/L (ref 135–145)
Sodium: 141 mmol/L (ref 135–145)

## 2020-08-16 LAB — CBC
HCT: 29.2 % — ABNORMAL LOW (ref 39.0–52.0)
Hemoglobin: 9 g/dL — ABNORMAL LOW (ref 13.0–17.0)
MCH: 28.8 pg (ref 26.0–34.0)
MCHC: 30.8 g/dL (ref 30.0–36.0)
MCV: 93.6 fL (ref 80.0–100.0)
Platelets: 255 10*3/uL (ref 150–400)
RBC: 3.12 MIL/uL — ABNORMAL LOW (ref 4.22–5.81)
RDW: 19.5 % — ABNORMAL HIGH (ref 11.5–15.5)
WBC: 9.4 10*3/uL (ref 4.0–10.5)
nRBC: 0 % (ref 0.0–0.2)

## 2020-08-16 LAB — HEPARIN LEVEL (UNFRACTIONATED)
Heparin Unfractionated: 0.37 IU/mL (ref 0.30–0.70)
Heparin Unfractionated: <0.1 IU/mL — ABNORMAL LOW (ref 0.30–0.70)

## 2020-08-16 LAB — LACTATE DEHYDROGENASE: LDH: 386 U/L — ABNORMAL HIGH (ref 98–192)

## 2020-08-16 LAB — PROTIME-INR
INR: 1.2 (ref 0.8–1.2)
Prothrombin Time: 15.4 seconds — ABNORMAL HIGH (ref 11.4–15.2)

## 2020-08-16 LAB — COOXEMETRY PANEL
Carboxyhemoglobin: 1.3 % (ref 0.5–1.5)
Methemoglobin: 0.9 % (ref 0.0–1.5)
O2 Saturation: 69.4 %
Total hemoglobin: 10.3 g/dL — ABNORMAL LOW (ref 12.0–16.0)

## 2020-08-16 LAB — GLUCOSE, CAPILLARY
Glucose-Capillary: 145 mg/dL — ABNORMAL HIGH (ref 70–99)
Glucose-Capillary: 168 mg/dL — ABNORMAL HIGH (ref 70–99)
Glucose-Capillary: 77 mg/dL (ref 70–99)
Glucose-Capillary: 67 mg/dL — ABNORMAL LOW (ref 70–99)
Glucose-Capillary: 108 mg/dL — ABNORMAL HIGH (ref 70–99)
Glucose-Capillary: 96 mg/dL (ref 70–99)

## 2020-08-16 LAB — AMMONIA: Ammonia: 18 umol/L (ref 9–35)

## 2020-08-16 MED ORDER — LIDOCAINE HCL (PF) 1 % IJ SOLN
INTRAMUSCULAR | Status: AC
  Filled 2020-08-16: qty 30

## 2020-08-16 MED ORDER — SODIUM CHLORIDE 0.9 % IV SOLN: 2.0000 g | INTRAVENOUS | Status: DC

## 2020-08-16 MED ORDER — NOREPINEPHRINE 4 MG/250ML-% IV SOLN: 0.0000 ug/min | INTRAVENOUS | Status: DC

## 2020-08-16 MED ORDER — FUROSEMIDE 10 MG/ML IJ SOLN
40.0000 mg | Freq: Once | INTRAMUSCULAR | Status: AC
  Administered 2020-08-16: 40 mg via INTRAVENOUS
  Filled 2020-08-16: qty 4

## 2020-08-16 MED ORDER — PIPERACILLIN-TAZOBACTAM 3.375 G IVPB
3.3750 g | Freq: Three times a day (TID) | INTRAVENOUS | Status: AC
  Administered 2020-08-17 – 2020-08-22 (×16): 3.375 g via INTRAVENOUS
  Filled 2020-08-16 (×16): qty 50

## 2020-08-16 MED ORDER — PIPERACILLIN-TAZOBACTAM 3.375 G IVPB 30 MIN
3.3750 g | Freq: Once | INTRAVENOUS | Status: AC
  Administered 2020-08-16: 3.375 g via INTRAVENOUS
  Filled 2020-08-16 (×2): qty 50

## 2020-08-16 MED ORDER — NOREPINEPHRINE 4 MG/250ML-% IV SOLN: 2.0000 ug/min | INTRAVENOUS | Status: DC

## 2020-08-16 MED ORDER — POTASSIUM CHLORIDE 10 MEQ/50ML IV SOLN
10.0000 meq | INTRAVENOUS | Status: AC
  Administered 2020-08-16 (×3): 10 meq via INTRAVENOUS
  Filled 2020-08-16 (×3): qty 50

## 2020-08-16 MED ORDER — SODIUM CHLORIDE 0.9 % IV SOLN: 250.0000 mL | INTRAVENOUS | Status: DC

## 2020-08-16 MED ORDER — SODIUM CHLORIDE 0.9% FLUSH
5.0000 mL | Freq: Three times a day (TID) | INTRAVENOUS | Status: DC
  Administered 2020-08-17 – 2020-08-24 (×20): 5 mL

## 2020-08-16 MED ORDER — SODIUM CHLORIDE 0.9 % IV SOLN
250.0000 mL | INTRAVENOUS | Status: DC
  Administered 2020-08-16: 250 mL via INTRAVENOUS

## 2020-08-16 MED ORDER — MIDAZOLAM HCL 2 MG/2ML IJ SOLN
INTRAMUSCULAR | Status: AC
  Filled 2020-08-16: qty 2

## 2020-08-16 MED ORDER — IOHEXOL 300 MG/ML  SOLN
50.0000 mL | Freq: Once | INTRAMUSCULAR | Status: AC | PRN
  Administered 2020-08-16: 15 mL

## 2020-08-16 MED ORDER — NOREPINEPHRINE 4 MG/250ML-% IV SOLN
2.0000 ug/min | INTRAVENOUS | Status: DC
  Administered 2020-08-16 (×2): 2 ug/min via INTRAVENOUS
  Filled 2020-08-16 (×2): qty 250

## 2020-08-16 MED ORDER — PIPERACILLIN-TAZOBACTAM 3.375 G IVPB
3.3750 g | Freq: Three times a day (TID) | INTRAVENOUS | Status: DC
  Filled 2020-08-16: qty 50

## 2020-08-16 NOTE — Progress Notes (Signed)
[place] [facility]  Date of Admission:  07/28/2020           Reason for visit: Follow up on fevers  Current antibiotics: Piperacillin tazobactam 5/18--present  Previous antibiotics: Cefepime 4/26 >> 5/05 Meropenem 5/10 > 5/16 Vancomycin 4/26 >> 4/30 Eraxis 5/16 > 5/18  ASSESSMENT:    Fevers: Multiple etiologies of potential fevers including indwelling central lines, psoas hematoma noted on CT, and abnormal gallbladder with findings concerning for a calculus cholecystitis.  Agree with attempted IR cholecystostomy tube drain placement in order to achieve source control. Elevated LFTs: In the setting of a calculus cholecystitis. Acute kidney injury: Requiring CRRT earlier in the admission but currently off dialysis at this time. Cardiogenic shock Type 2 diabetes Multiple posterior circulation infarcts noted on CT: Neurology following peripherally and recommending MRI when LVAD removed  PLAN:    Start piperacillin tazobactam per pharmacy dosed for current renal function Follow-up IR plans for cholecystostomy tube placement Given negative blood cultures would stop antifungals Monitor fever curve, WBC, LFTs Line removal/holidays per CCM when safe to do so   Principal Problem:   Acute acalculous cholecystitis Active Problems:   Acute on chronic heart failure (HCC)   Non-ST elevation (NSTEMI) myocardial infarction [facility])   Essential hypertension   Hx of CABG   Cardiogenic shock ([place])   Coronary artery disease involving coronary bypass graft of native heart with unstable angina pectoris (HCC)   Pleural effusion on right   AKI (acute kidney injury) ([place])   Cerebral embolism with cerebral infarction    MEDICATIONS:    Scheduled Meds: . aspirin EC  325 mg Oral Daily   Or  . aspirin  324 mg Per Tube Daily  . chlorhexidine gluconate (MEDLINE KIT)  15 mL Mouth Rinse BID  . Chlorhexidine Gluconate Cloth  6 each Topical Daily  . darbepoetin (ARANESP)  injection - NON-DIALYSIS  150 mcg Subcutaneous Q Wed-1800  . feeding supplement (PROSource TF)  45 mL Per Tube 5 X Daily  . ferrous [id number] C-folic acid  1 capsule Oral BID PC  . fiber  1 packet Per Tube BID  . Gerhardt's butt cream   Topical QID  . insulin aspart  3 Units Subcutaneous Q4H  . insulin aspart  3-9 Units Subcutaneous Q4H  . insulin detemir  29 Units Subcutaneous BID  . levalbuterol  0.63 mg Nebulization TID  . mouth rinse  15 mL Mouth Rinse 10 times per day  . mexiletine  200 mg Per Tube Q12H  . midodrine  10 mg Per Tube TID WC  . pantoprazole sodium  40 mg Per Tube Daily  . rosuvastatin  10 mg Per Tube Daily  . sodium chloride flush  10-40 mL Intracatheter Q12H   Continuous Infusions: . sodium chloride 10 mL/hr at 08/16/20 1100  . sodium chloride    . sodium chloride    . sodium chloride    . DOBUTamine 2.5 mcg/kg/min (08/16/20 1100)  . feeding supplement (VITAL 1.5 CAL) 55 mL/hr at 08/16/20 0700  . fentaNYL infusion INTRAVENOUS 150 mcg/hr (08/16/20 1100)  . heparin 1,100 Units/hr (08/16/20 1100)  . impella catheter heparin 50 unit/mL in dextrose 5% 10.5 mL/hr at 08/09/20 2227  . norepinephrine (LEVOPHED) Adult infusion Stopped (08/16/20 0849)  . piperacillin-tazobactam    . piperacillin-tazobactam (ZOSYN)  IV    . potassium chloride 10 mEq (08/16/20 1149)   PRN Meds:.sodium chloride, Place/Maintain arterial line **AND** sodium chloride, acetaminophen (TYLENOL) oral liquid 160  mg/5 mL, fentaNYL (SUBLIMAZE) injection, polyvinyl alcohol, sodium chloride flush  SUBJECTIVE:   24 hour events:  Right upper quadrant ultrasound concerning for a calculus cholecystitis Fever curve improved LFTs increased with AST 303, ALT 105, alk phos 517 WBC 9.4 Possible plan for Impella removal today after cholecystostomy tube placement Creatinine 3.98   Unable to obtain subjective history  Review of Systems  Unable to perform ROS: Intubated      OBJECTIVE:    Blood pressure 124/71, pulse 81, temperature 98.96 F (37.2 C), resp. rate 14, height 6' 1"  (1.854 m), weight 114.3 kg, SpO2 100 %. Body mass index is 33.25 kg/m.  Physical Exam  General: Vital signs reviewed.  Patient is currently intubated and sedated on the ventilator. Head: Normocephalic and atraumatic.   Neck: Supple, trach in place Pulmonary/Chest: Ventilated breath sounds. Abdominal: Soft, + tender Musculoskeletal: s/p left TMA Extremities: No swelling or edema. No cyanosis or clubbing. Neurological: Sedated, unresponsive. Skin: Warm, dry and intact. No rashes or erythema. Right IJ HD cath, left [place] CVC, FOley, rectal tube    Lab Results: Lab Results  Component Value Date   WBC 9.4 08/16/2020   HGB 9.0 (L) 08/16/2020   HCT 29.2 (L) 08/16/2020   MCV 93.6 08/16/2020   PLT 255 08/16/2020    Lab Results  Component Value Date   NA 140 08/16/2020   K 3.5 08/16/2020   CO2 26 08/16/2020   GLUCOSE 131 (H) 08/16/2020   BUN 83 (H) 08/16/2020   CREATININE 3.98 (H) 08/16/2020   CALCIUM 7.6 (L) 08/16/2020   GFRNONAA 16 (L) 08/16/2020   GFRAA >60 08/08/2019    Lab Results  Component Value Date   ALT 105 (H) 08/16/2020   AST 303 (H) 08/16/2020   ALKPHOS 517 (H) 08/16/2020   BILITOT 1.8 (H) 08/16/2020    No results found for: CRP  No results found for: ESRSEDRATE   I have reviewed the micro and lab results in Epic.  Imaging: CT HEAD WO CONTRAST  Result Date: 08/15/2020 CLINICAL DATA:  Delirium, acute encephalopathy, CABG, multiple infarcts EXAM: CT HEAD WITHOUT CONTRAST TECHNIQUE: Contiguous axial images were obtained from the base of the skull through the vertex without intravenous contrast. COMPARISON:  08/06/2020 FINDINGS: Brain: No evidence of new infarction, hemorrhage, hydrocephalus, extra-axial collection or mass lesion/mass effect. Hypodensity of the right cerebellar pedicle is again noted (series 7, image 10); other previously seen parenchymal hypodensities  less well appreciated. Vascular: No hyperdense vessel or unexpected calcification. Skull: Normal. Negative for fracture or focal lesion. Sinuses/Orbits: No acute finding. Other: None. IMPRESSION: 1. No acute intracranial pathology. 2. Hypodensity of the right cerebellar pedicle is again noted; other previously seen parenchymal hypodensities less well appreciated. Findings are generally in keeping with expected evolution of small subacute infarctions. No evidence of hemorrhage or other complication. MRI is more sensitive for the assessment of acutely superimposed diffusion restricting infarction if clinically suspected. Electronically Signed   By: Eddie Candle M.D.   On: 08/15/2020 11:22   DG CHEST PORT 1 VIEW  Result Date: 08/15/2020 CLINICAL DATA:  Shortness of breath EXAM: PORTABLE CHEST 1 VIEW COMPARISON:  Aug 14, 2020. FINDINGS: Tracheostomy catheter tip is 5.8 cm above the carina. Enteric tube tip below stomach. Impella device present, unchanged in position. Right jugular catheter tip in superior vena cava. Port-A-Cath tip in superior vena cava. No pneumothorax. There is mild bibasilar atelectasis. No edema or consolidation. Heart is upper normal in size with pulmonary vascularity normal. There is aortic  atherosclerosis. No adenopathy no bone lesions. IMPRESSION: Tube and catheter positions as described pneumothorax. Mild bibasilar atelectasis. Stable cardiac silhouette. Electronically Signed   By: Lowella Grip III M.D.   On: 08/15/2020 08:01   VAS [place] LOWER EXTREMITY VENOUS (DVT)  Result Date: 08/16/2020  Lower Venous DVT Study Patient Name:  [facility]  Date of Exam:   08/16/2020 Medical Rec #: [id number]      Accession #:    [id number] Date of Birth: 03-Aug-1950     Patient Gender: M Patient Age:   69Y Exam Location:  [facility] Procedure:      VAS [place] LOWER EXTREMITY VENOUS (DVT) Referring Phys: [id number] [place]  --------------------------------------------------------------------------------  Indications: Edema.  Comparison Study: 08/11/20 previous Performing Technologist: Abram Sander RVS  Examination Guidelines: A complete evaluation includes B-mode imaging, spectral Doppler, color Doppler, and power Doppler as needed of all accessible portions of each vessel. Bilateral testing is considered an integral part of a complete examination. Limited examinations for reoccurring indications may be performed as noted. The reflux portion of the exam is performed with the patient in reverse Trendelenburg.  +---------+---------------+---------+-----------+----------+-------------------+ RIGHT    CompressibilityPhasicitySpontaneityPropertiesThrombus Aging      +---------+---------------+---------+-----------+----------+-------------------+ CFV      Full           Yes      Yes                                      +---------+---------------+---------+-----------+----------+-------------------+ SFJ      Full                                                             +---------+---------------+---------+-----------+----------+-------------------+ FV Prox  Full                                                             +---------+---------------+---------+-----------+----------+-------------------+ FV Mid   Full                                                             +---------+---------------+---------+-----------+----------+-------------------+ FV DistalFull                                                             +---------+---------------+---------+-----------+----------+-------------------+ PFV      Full                                                             +---------+---------------+---------+-----------+----------+-------------------+  POP      Full           Yes      Yes                                       +---------+---------------+---------+-----------+----------+-------------------+ PTV      Full                                                             +---------+---------------+---------+-----------+----------+-------------------+ PERO                                                  Not well visualized +---------+---------------+---------+-----------+----------+-------------------+   +---------+---------------+---------+-----------+----------+-------------------+ LEFT     CompressibilityPhasicitySpontaneityPropertiesThrombus Aging      +---------+---------------+---------+-----------+----------+-------------------+ CFV      Full           Yes      Yes                                      +---------+---------------+---------+-----------+----------+-------------------+ SFJ      Full                                                             +---------+---------------+---------+-----------+----------+-------------------+ FV Prox  Full                                                             +---------+---------------+---------+-----------+----------+-------------------+ FV Mid   Full                                                             +---------+---------------+---------+-----------+----------+-------------------+ FV DistalFull                                                             +---------+---------------+---------+-----------+----------+-------------------+ PFV      Full                                                             +---------+---------------+---------+-----------+----------+-------------------+ POP      Full  Yes      Yes                                      +---------+---------------+---------+-----------+----------+-------------------+ PTV      Full                                                             +---------+---------------+---------+-----------+----------+-------------------+  PERO                                                  Not well visualized +---------+---------------+---------+-----------+----------+-------------------+     Summary: BILATERAL: - No evidence of deep vein thrombosis seen in the lower extremities, bilaterally. -No evidence of popliteal cyst, bilaterally.   *See table(s) above for measurements and observations.    Preliminary    ECHOCARDIOGRAM LIMITED  Result Date: 08/15/2020    ECHOCARDIOGRAM LIMITED REPORT   Patient Name:   [facility] Date of Exam: 08/15/2020 Medical Rec #:  [id number]     Height:       73.0 in Accession #:    [id number]    Weight:       243.4 lb Date of Birth:  1950-09-27    BSA:          2.339 m Patient Age:    28 years      BP:           130/67 mmHg Patient Gender: M             HR:           80 bpm. Exam Location:  Inpatient Procedure: Limited Echo Indications:    CHF-Acute Systolic W97.98  History:        Patient has prior history of Echocardiogram examinations, most                 recent 08/10/2020. Signs/Symptoms:Fever. Cardiogenic shock:                 Ischemic cardiomyopathy, post-CABG on 4/26. Impella 5.5 down to                 P4. Thrombocytopenia, Anemia, AAA, Acute kidney injury.  Sonographer:    Darlina Sicilian RDCS Referring Phys: [place]  Conclusion(s)/Recommendation(s): LIMITED ECHO for Impella cannula placement. Left Ventricle: Left ventricular ejection fraction, by estimation, is <20%. The left ventricle has severely decreased function. Compared to prior, Similar cannular placement (5.2 cm from the annulus). Rudean Haskell MD Electronically signed by Rudean Haskell MD Signature Date/Time: 08/15/2020/11:01:39 AM    Final    [place] EKG SITE RITE  Result Date: 08/15/2020 If Site Rite image not attached, placement could not be confirmed due to current cardiac rhythm.  CT CHEST ABDOMEN PELVIS WO CONTRAST  Result Date: 08/15/2020 CLINICAL DATA:  Sepsis, recent CABG EXAM: CT CHEST, ABDOMEN AND  PELVIS WITHOUT CONTRAST TECHNIQUE: Multidetector CT imaging of the chest, abdomen and pelvis was performed following the standard protocol without IV contrast. COMPARISON:  09/28/2018 FINDINGS: CT CHEST FINDINGS Cardiovascular: Aortic atherosclerosis. Cardiomegaly status post median sternotomy and CABG. Extensive 3  vessel coronary artery calcifications and/or stents. Right subclavian approach Impella device position within the left ventricle. Right internal jugular and left subclavian venous catheters. No pericardial effusion. Mediastinum/Nodes: No enlarged mediastinal, hilar, or axillary lymph nodes. Tracheostomy. Thyroid and esophagus demonstrate no significant findings. Lungs/Pleura: Small, right greater than left bilateral pleural effusions and associated atelectasis or consolidation. Musculoskeletal: No chest wall mass or suspicious bone lesions identified. Status post median sternotomy and CABG with postoperative retrosternal stranding and fluid (series 3, image 26). There is no discrete fluid collection identified. Superficial metallic pellets in the left anterior chest wall. CT ABDOMEN PELVIS FINDINGS Hepatobiliary: No solid liver abnormality is seen. The gallbladder is mildly distended with some suggestion of wall thickening and dependent calcified sludge. No discrete gallstones identified. No biliary ductal dilatation. Pancreas: Unremarkable. No pancreatic ductal dilatation or surrounding inflammatory changes. Spleen: Normal in size without significant abnormality. Adrenals/Urinary Tract: Adrenal glands are unremarkable. Small nonobstructive calculus of the superior pole of the right kidney. No hydronephrosis. Bladder is decompressed by a Foley catheter. Thickening of the urinary bladder wall. Stomach/Bowel: Stomach is within normal limits. Enteric feeding tube with tip in the duodenal bulb. Appendix is not clearly visualized. No evidence of bowel wall thickening, distention, or inflammatory changes.  Rectal tube. Vascular/Lymphatic: Aortic atherosclerosis. Redemonstrated calcified aneurysm of the infrarenal abdominal aorta measuring 4.5 x 4.3 cm (series 3, image 80). No enlarged abdominal or pelvic lymph nodes. Reproductive: No mass or other abnormality. Other: No abdominal wall hernia or abnormality. Anasarca. Trace ascites throughout the abdomen and pelvis. Musculoskeletal: No acute or significant osseous findings. There is a small hematoma within the left psoas muscle body, measuring approximately 5.5 x 3.6 x 3.1 cm (series 3, image 88, series 6, image 122). IMPRESSION: 1. Status post median sternotomy and CABG with postoperative retrosternal stranding and fluid. There is no discrete fluid collection or other evidence of postoperative complication identified. 2. The gallbladder is mildly distended with some suggestion of wall thickening and dependent calcified sludge. No discrete gallstones identified. If there is clinical concern for acute cholecystitis, right upper quadrant ultrasound or HIDA may be useful to further evaluate. 3. There is a small hematoma within the left psoas muscle body, measuring approximately 5.5 x 3.6 x 3.1 cm. 4. Small, right greater than left bilateral pleural effusions and associated atelectasis or consolidation. 5. Trace ascites and anasarca. 6. Extensive support apparatus as detailed above including tracheostomy and Impella device. 7. Redemonstrated calcified aneurysm of the infrarenal abdominal aorta measuring 4.5 x 4.3 cm. Recommend follow-up CT/MR every 6 months and vascular consultation when clinically appropriate. This recommendation follows ACR consensus guidelines: White Paper of the ACR Incidental Findings Committee II on Vascular Findings. J Am Coll Radiol 2013; 10:789-794. Aortic Atherosclerosis (ICD10-I70.0). Electronically Signed   By: Eddie Candle M.D.   On: 08/15/2020 11:39   US Abdomen Limited RUQ (LIVER/GB)  Result Date: 08/16/2020 CLINICAL DATA:  Fever EXAM:  ULTRASOUND ABDOMEN LIMITED RIGHT UPPER QUADRANT COMPARISON:  CT Aug 15, 2020 FINDINGS: Gallbladder: Sludge in a dilated gallbladder with wall thickening measuring 3.5 mm and trace pericholecystic fluid. No cholelithiasis visualized. Common bile duct: Diameter: 3 mm Liver: No focal lesion identified. Within normal limits in parenchymal echogenicity. Portal vein is patent on color Doppler imaging with normal direction of blood flow towards the liver. Other: None. IMPRESSION: 1. Sludge in a dilated gallbladder with wall thickening and trace pericholecystic fluid. No cholelithiasis visualized. Findings are suspicious for acute acalculous cholecystitis. Electronically Signed   By: Dahlia Bailiff  MD   On: 08/16/2020 03:35     Imaging independently reviewed in Epic.    [facility] [phone] pager 08/16/2020, 11:53 AM  I spent greater than 35 minutes with the patient including greater than 50% of time in face to face counsel of the patient and in coordination of their care.

## 2020-08-16 NOTE — Progress Notes (Signed)
Admit: 07/22/2020 LOS: 75  52M progressive AKI after CABG with prolonged shock (cardiogenic and ? Vasodilatory/septic), failure to achieve target volume status despite aggressive diuretics.    Catheter: right IJ Temp IJ CCM placed 5/14  S: Fever gone.  Continues to have good urine output.  On dobutamine and a little bit of norepinephrine.  Creatinine improving.  Minimally interactive   O: 05/17 0701 - 05/18 0700 In: 9381 [I.V.:807.3; NG/GT:2210; IV Piggyback:130] Out: 3300 [Urine:2340; Emesis/NG output:60; Stool:900]  Vitals:   08/16/20 0846 08/16/20 0900  BP:  (!) 144/81  Pulse:  89  Resp:  (!) 22  Temp:  98.96 F (37.2 C)  SpO2: 100% 100%     PE Eyes lying in bed in no distress Lungs-bilateral chest rise, ventilated CV-normal rate, sternotomy scar Abd-no pain with palpation, soft Ext-minimal pitting edema  Filed Weights   08/11/20 0500 08/14/20 0600 08/16/20 0500  Weight: 112.2 kg 110.4 kg 114.3 kg    Recent Labs  Lab 08/12/20 0252 08/12/20 0738 08/12/20 1521 08/13/20 0326 08/13/20 1526 08/14/20 0428 08/15/20 0413 08/16/20 0429  NA 137   < > 138  139  139 140   < > 137 139 140  K 3.2*   < > 3.8  3.8  3.8 3.3*   < > 3.2* 3.5 3.5  CL 87*   < > 89*  90*  90* 90*   < > 96* 100 103  CO2 37*   < > 34*  34*  35* 33*   < > _0 GLUCOSE 186*   < > 225*  224*  225* 125*   < > 142* 229* 131*  BUN 42*   < > 53*  52*  53* 62*   < > 72* 80* 83*  CREATININE 3.34*   < > 4.00*  3.88*  3.94* 4.33*   < > 4.84* 4.89* 3.98*  CALCIUM 8.3*   < > 8.0*  8.1*  8.1* 8.3*   < > 7.9* 7.7* 7.6*  PHOS 3.2  --  5.0* 5.0*  --   --   --   --    < > = values in this interval not displayed.   Recent Labs  Lab 08/12/20 0738 08/13/20 0306 08/13/20 1526 08/14/20 0428 08/15/20 0413 08/16/20 0429  WBC 10.2   < > 8.6 8.0 9.7 9.4  NEUTROABS 7.4  --  5.9  --   --   --   HGB 7.5*   < > 9.2* 9.3* 9.3* 9.0*  HCT 24.4*   < > 28.9* 29.3* 30.0* 29.2*  MCV 92.1   < > 89.5  91.0 92.9 93.6  PLT 306   < > 303 284 266 255   < > = values in this interval not displayed.    Scheduled Meds: . aspirin EC  325 mg Oral Daily   Or  . aspirin  324 mg Per Tube Daily  . chlorhexidine gluconate (MEDLINE KIT)  15 mL Mouth Rinse BID  . Chlorhexidine Gluconate Cloth  6 each Topical Daily  . darbepoetin (ARANESP) injection - NON-DIALYSIS  150 mcg Subcutaneous Q Wed-1800  . feeding supplement (PROSource TF)  45 mL Per Tube 5 X Daily  . ferrous [id number] C-folic acid  1 capsule Oral BID PC  . fiber  1 packet Per Tube BID  . furosemide  40 mg Intravenous Once  . Gerhardt's butt cream   Topical QID  . insulin aspart  3 Units Subcutaneous Q4H  .  insulin aspart  3-9 Units Subcutaneous Q4H  . insulin detemir  29 Units Subcutaneous BID  . levalbuterol  0.63 mg Nebulization TID  . mouth rinse  15 mL Mouth Rinse 10 times per day  . mexiletine  200 mg Per Tube Q12H  . midodrine  10 mg Per Tube TID WC  . pantoprazole sodium  40 mg Per Tube Daily  . rosuvastatin  10 mg Per Tube Daily  . sodium chloride flush  10-40 mL Intracatheter Q12H   Continuous Infusions: . sodium chloride Stopped (08/15/20 1637)  . sodium chloride    . sodium chloride    . sodium chloride    . dextrose Stopped (08/16/20 0137)  . DOBUTamine 2.5 mcg/kg/min (08/16/20 0800)  . feeding supplement (VITAL 1.5 CAL) 55 mL/hr at 08/16/20 0700  . fentaNYL infusion INTRAVENOUS 100 mcg/hr (08/16/20 0800)  . heparin 1,150 Units/hr (08/16/20 0800)  . impella catheter heparin 50 unit/mL in dextrose 5% 10.5 mL/hr at 08/09/20 2227  . norepinephrine (LEVOPHED) Adult infusion Stopped (08/16/20 0745)  . piperacillin-tazobactam    . piperacillin-tazobactam (ZOSYN)  IV    . potassium chloride 10 mEq (08/16/20 0851)   PRN Meds:.sodium chloride, Place/Maintain arterial line **AND** sodium chloride, acetaminophen (TYLENOL) oral liquid 160 mg/5 mL, fentaNYL (SUBLIMAZE) injection, polyvinyl alcohol, sodium chloride  flush  ABG    Component Value Date/Time   PHART 7.598 (H) 08/11/2020 0356   PCO2ART 52.8 (H) 08/11/2020 0356   PO2ART 127 (H) 08/11/2020 0356   HCO3 51.2 (H) 08/11/2020 0356   TCO2 43 (H) 08/11/2020 0742   ACIDBASEDEF 2.0 08/05/2020 0400   O2SAT 69.4 08/16/2020 0429    A/P  1. Dialysis dependent AKI  secondary to  cardiorenal syndrome probably some ATN s/p CABG.  Required CRRT from 5/3 to 5/13.  BL Crt <2.  Creatinine now improving.  Good urine output.  Improving volume status.  If his creatinine continues to improve with good urine output may sign off in the next day or 2. 2. Acute systolic CHF / ICM; impella but weaning possibly will remove today,  Currently on dobut-CVP acceptable.  Management per primary.  Urine output adequate.  Could consider Lasix if needed 3. CAD s/p  5V CABG 4/26 4. Anemia-ESA on board-  Transfuse PRN-  Given one unit on 5/12 5. DM2 6. PAD hx/o Fem Pop Bypass 7. VDRF -remains ventilated 8. Numerous ischemic bilateral posterior CVAs, neurology following-- this is the major issue 9. Fevers: Extensive fevers.  Now improved.  Possibly was drug fever related to meropenem   agree with goals of care conversation, grim prognosis given his prolonged neurological insult.  Partial code-  But proceeding with peg and trach.   Fortunately he has not needed renal replacement therapy for several days.   Reesa Chew, MD  [facility]

## 2020-08-16 NOTE — Sedation Documentation (Signed)
No sedation given during procedure by this RN.

## 2020-08-16 NOTE — Progress Notes (Signed)
[place] for Heparin Indication: Impella 5.5  No Known Allergies  Patient Measurements: Height: '6\' 1"'$  (185.4 cm) Weight: 114.3 kg (251 lb 15.8 oz) IBW/kg (Calculated) : 79.9 Heparin Dosing Weight: 102.8 kg  Vital Signs: Temp: 98.96 F (37.2 C) (05/18 0900) Temp Source: Bladder (05/18 0800) BP: 144/81 (05/18 0900) Pulse Rate: 89 (05/18 0900)  Labs: Recent Labs    08/14/20 0428 08/14/20 1650 08/15/20 0413 08/15/20 1632 08/16/20 0429  HGB 9.3*  --  9.3*  --  9.0*  HCT 29.3*  --  30.0*  --  29.2*  PLT 284  --  266  --  255  HEPARINUNFRC 0.37   < > 0.39 0.39 0.37  CREATININE 4.84*  --  4.89*  --  3.98*   < > = values in this interval not displayed.    Estimated Creatinine Clearance: 23.5 mL/min (A) (by C-G formula based on SCr of 3.98 mg/dL (H)).   Assessment: 70 yo M presents with NSTEMI and multivessel CAD, now s/p CABG with Impella support. Pharmacy asked to manage systemic heparin.  Heparin level 0.37, slightly above goal,  CBC low but stable, LDH down to 386  Goal of Therapy:  Heparin level 0.2-0.3 units/ml Monitor platelets by anticoagulation protocol: Yes   Plan:  Contine heparin purge solution (50 units/ml) Reduce IV heparin to 1100 units/h q12 labs at [place], PharmD PGY1 Pharmacy Resident 08/16/2020 9:37 AM

## 2020-08-16 NOTE — Progress Notes (Signed)
NAMEChirag Carpenter, MRN:  [id number], DOB:  November 11, 1950, LOS: 61 ADMISSION DATE:  07/16/2020, CONSULTATION DATE: 07/28/2018 REFERRING MD: Dr. Orvan Seen, CHIEF COMPLAINT: Ventilator dependent  History of Present Illness:  Danny Carpenter is a 70 year old male with past medical history significant for hypertension, hyperlipidemia, type 2 diabetes, CVA, AAA, left foot amputation s/p fem pop bypass and heart failure who presented to the emergency department 4/21 after undergoing outpatient stress test that revealed EF of 20% and acute ischemia.  Since admission patient has been evaluated by cardiology and cardiothoracic surgery and patient underwent TEE and left heart cath.  LHC/RHC revealed severe three-vessel coronary artery disease with moderately elevated left heart and pulmonary artery pressures.  At that time patient was placed on goal-directed medical therapy for acute systolic congestive heart failure and CTS surgery was consulted for CABG potential.  Patient underwent CABG x5 with Dr. Orvan Seen 4/26, post CABG CODE BLUE called due to low flow on Impella, CTS notified.  Patient underwent bedside exploratory postoperative open heart where Dr. Julien Girt reopened the chest incision and hematoma was removed, patient's hemodynamics improved upon opening sternotomy.  PCCM consulted morning of 4/28 due to difficulty weaning ventilator  Pertinent  Medical History  Hypertension, hyperlipidemia, type 2 diabetes, CVA, AAA, left foot amputation s/p fem pop bypass and heart failure   Significant Hospital Events: Including procedures, antibiotic start and stop dates in addition to other pertinent events   . 4/21 presented to the ED for evaluation of abnormal stress test . 4/22 left and right heart cath severe multivessel disease . 4/26 CABG x5 with hemodynamic instability post requiring brief CPR with eventual return back to the OR anastomosis bleeding was seen . 4/28 remains ventilator dependent, slowly weaning  PEEP . 5/1 remains on vent, diuresing, no ready to come off  . 5/3 started on CRRT. . 5/5 requiring sedation for vent  . 5/6 CT scan shows multiple areas of posterior circulation infarction. . 5/8 tolerating CRRT with fluid removal.  Decreasing vasopressor requirements. . 5/12 TCT today . 5/13 VT event, self converted, febrile . 5/14 spent ~30 mins in [place] until paced out, code status reversed by Dr. Julien Girt, patient spent 30 mins with no pulsatility and no flow except for the impella.  . 5/16 starting eraxis for persistent fevers . 5/17 remains febrile; planning to pan-CT  Interim History / Subjective:  Afebrile overnight. Impella P3 this morning, now with back flow alarms.  Objective   Blood pressure 114/60, pulse 89, temperature 99 F (37.2 C), resp. rate 20, height '6\' 1"'$  (1.854 m), weight 114.3 kg, SpO2 100 %. CVP:  [5 mmHg-12 mmHg] 5 mmHg  Vent Mode: PRVC FiO2 (%):  [40 %] 40 % Set Rate:  [20 bmp] 20 bmp Vt Set:  [630 mL] 630 mL PEEP:  [5 cmH20] 5 cmH20 Pressure Support:  [12 cmH20] 12 cmH20 Plateau Pressure:  [19 cmH20-25 cmH20] 19 cmH20   Intake/Output Summary (Last 24 hours) at 08/16/2020 0734 Last data filed at 08/16/2020 0700 Gross per 24 hour  Intake 3391.98 ml  Output 3300 ml  Net 91.98 ml   Filed Weights   08/11/20 0500 08/14/20 0600 08/16/20 0500  Weight: 112.2 kg 110.4 kg 114.3 kg   I/O +394, net -16.8L for admission  Examination: General:critically ill appearing man lying in bed in NAD HEENT: [place]/AT, eyes with better conjunctival injection, non icteric sclera. Neck: trach without bleeding or erythema Neuro: eyes open, moves head with verbal stimulation but not moving extremities purposefully, not  following commands. Reflexive grip strength. CV: S1S2, irreg rhythm, reg rate PULM: synchronous with MV, CTAB. No tracheal secretions GI: obese, soft Extremities: +edema, forefoot amputation on left Skin: pallor, no rashes   Labs/imaging that I have  personally reviewed    5/16 blood cultures> NGTD 5/14 blood cx> NGTD  Labs reviewed Potassium 3.5 Bicarb 26 BUN 80, uptrending Creatinine 4.89, uptrending LDH 468, uptrending White blood cell count 9.7 H/H 9.3/30  coox 75.3% on impella P4, dobutamine  CXR personally reviewed> trach, impella, RIJ dialysis catheter in place. Bilateral pulmonary edema, cardiomegaly.  Resolved Hospital Problem list     Assessment & Plan:   Acute hypoxic vent- dependent  respiratory failure  Tracheostomy dependent R pleural effusion, mild pulmonary edema  Plan: -Con't LTVV, 4-8cc/kg IBW with goal Pplat<30 and DP<15 -VAP prevention protocol -Pad protocol, no sedation needed so far -Routine trach care per protocol -Daily SAT & SBT; mental status precludes extubation. Still on very significant cardiac support.  Cardiogenic shock due to ischemic cardiomyopathy CAD s/p CABG x5 -Continue Impella, weaning support.  Management per cardiology. Increased LDH from sepsis- better since adding eraxis. -Remains on norepinephrine and dobutamine. -Continue aspirin and statin -Started mexiletine. Previously on lidocaine. -Continue monitoring on telemetry.   Acute Encephalopathy: multifactorial in setting of critical illness. Prolonged icu stay, posterior circulation CVA.  Concern for anoxic brain injury during low flow state -Unable to get brain MRI due to pacing wires for further evaluation. Head CT today. - Avoid sedating meds - Delirium precautions - Continue to monitor renal function and renally dose medications  Ongoing fevers - HD catheter and Foley exchanged previously -hopefully removing impella and possibly pacer wires today - Cultures pending - con't Eraxis, add back zosyn.    AKI Oliguria improved - strict I/Os - Continue diuretics - Renally dose meds and avoid nephrotoxic meds -con't daily monitoring  Colonic ileus, resolved -TF on hold for procedure today. Increasing TF towards  goal, monitor for intolerance.  Acute blood loss anemia due to operative blood loss and critical illness Thrombocytopenia -resolved - Serial CBCs - Transfuse for hemoglobin less than 7 or hemodynamically significant bleeding  AAA -Supportive care; not a candidate for intervention  Hyperglycemia- controlled.  History of poorly controlled diabetes PTA. -Continue long-acting insulin + sliding scale insulin - Goal BG 140-180 while admitted to the ICU --adding TF coverage Q4h  PAD s/p partial L foot amputation  -monitor peripheral circulation-- has been stable this week -Aspirin and statin daily  Best practice   Diet:  Tube Feed  - on hold for procedures Pain/Anxiety/Delirium protocol (if indicated): Yes (RASS goal 0)  VAP protocol (if indicated): Yes DVT prophylaxis: Systemic AC - Heparin  GI prophylaxis: PPI   Glucose control:  SSI Yes - SSI + basal  Central venous access:  Yes, and it is still needed, Arterial line:  Yes, and it is still needed  Foley:  Yes, and it is still needed  Mobility:  bed rest  PT consulted: N/A Last date of multidisciplinary goals of care discussion: ongoing  Code Status:  full code Disposition: ICU  This patient is critically ill with multiple organ system failure; which, requires frequent high complexity decision making, assessment, support, evaluation, and titration of therapies. This was completed through the application of advanced monitoring technologies and extensive interpretation of multiple databases. During this encounter critical care time was devoted to patient care services described in this note for 45 minutes.  Julian Hy, DO [facility]  08/16/2020 7:34 AM

## 2020-08-16 NOTE — Progress Notes (Addendum)
Patient ID: Danny Carpenter, male   DOB: 1950-07-11, 70 y.o.   MRN: [id number]     Advanced Heart Failure Rounding Note  PCP-Cardiologist: None   Subjective:    - 4/26 S/P CABG - 5/4 Swan removed with ongoing fevers, CVVH started - CT head on 5/6 with multiple posterior circulation infarcts.  - 5/10 tracheostomy. CT head similar to prior with multiple small infarcts.  - 5/14 Torsades -> paced out by Dr. Orvan Seen. Impella turned back up from P3 -> P7  after code. Amio gtt switched to lidocaine.   - 5/16 Eraxis added for persistent fevers. Cultures resent>>pending  - 5/17 CT chest/abdomen/pelvis and abdominal US suggestive of acalculous cholecystitis  Rhythm stable. No further VT. A-V paced. Now off lidocaine and on Mexiletine.   Impella now at P-3. No alarms. Remains on DBA 2.5. Off NE.  Co-ox 69%.   Has been off CRRT since 5/13 for HD holiday. Made about 2.3 L of urine yesterday. CVP 6.  SCr trending down 4.89>>3.98.    mTemp overnight 99.3. WBC nl.  Repeat BC NGTD. Patient is on Eraxis for fungal coverage.   CT of A/P yesterday notable for distended GB w/ wall thickening. F/u US c/w acute acalculous cholecystitis.   Per RN, intermittently follows commands.   Impella P3 Flow 2.1 Waveforms ok LDH 290>>468>>386  Position of Impella stable on 5/12 echo.   Objective:   Weight Range: 114.3 kg Body mass index is 33.25 kg/m.   Vital Signs:   Temp:  [98.4 F (36.9 C)-101.48 F (38.6 C)] 99 F (37.2 C) (05/18 0700) Pulse Rate:  [76-99] 89 (05/18 0700) Resp:  [16-28] 20 (05/18 0700) BP: (111-167)/(56-99) 114/60 (05/18 0700) SpO2:  [99 %-100 %] 100 % (05/18 0700) Arterial Line BP: (110-181)/(44-93) 116/55 (05/18 0700) FiO2 (%):  [40 %] 40 % (05/18 0400) Weight:  [114.3 kg] 114.3 kg (05/18 0500) Last BM Date: 08/15/20  Weight change: Filed Weights   08/11/20 0500 08/14/20 0600 08/16/20 0500  Weight: 112.2 kg 110.4 kg 114.3 kg    Intake/Output:   Intake/Output Summary  (Last 24 hours) at 08/16/2020 0802 Last data filed at 08/16/2020 0700 Gross per 24 hour  Intake 2798.78 ml  Output 3100 ml  Net -301.22 ml      Physical Exam   CVP 6 General:  Critically ill, on vent through trach  HEENT: normal Neck: supple. Lt subclavian CVC + Rt IJ HD cath. + Lf axillary impella (site ok) Carotids 2+ bilat; no bruits. No lymphadenopathy or thryomegaly appreciated. Cor: Impella site ok. RRR. No rubs, gallops or murmurs.  Lungs: coarse bilaterally. No wheezing Abdomen: soft, nontender, nondistended. No hepatosplenomegaly. No bruits or masses. Good bowel sounds. Extremities: no cyanosis, clubbing, rash, trace bilateral LE edema, left TMA Neuro: moves bilateral upper extremities. Intermittently follows commands   Telemetry    AV paced 90s Personally reviewed  Labs    CBC Recent Labs    08/13/20 1526 08/14/20 0428 08/15/20 0413 08/16/20 0429  WBC 8.6   < > 9.7 9.4  NEUTROABS 5.9  --   --   --   HGB 9.2*   < > 9.3* 9.0*  HCT 28.9*   < > 30.0* 29.2*  MCV 89.5   < > 92.9 93.6  PLT 303   < > 266 255   < > = values in this interval not displayed.   Basic Metabolic Panel Recent Labs    08/13/20 1526 08/14/20 0428 08/15/20 0413 08/16/20 0429  NA  138   < > 139 140  K 3.3*   < > 3.5 3.5  CL 92*   < > 100 103  CO2 29   < > 26 26  GLUCOSE 95   < > 229* 131*  BUN 69*   < > 80* 83*  CREATININE 4.87*   < > 4.89* 3.98*  CALCIUM 7.9*   < > 7.7* 7.6*  MG 2.5*  --   --   --    < > = values in this interval not displayed.   Liver Function Tests No results for input(s): AST, ALT, ALKPHOS, BILITOT, PROT, ALBUMIN in the last 72 hours. No results for input(s): LIPASE, AMYLASE in the last 72 hours. Cardiac Enzymes No results for input(s): CKTOTAL, CKMB, CKMBINDEX, TROPONINI in the last 72 hours.  BNP: BNP (last 3 results) Recent Labs    07/26/2020 1211  BNP 465.5*    ProBNP (last 3 results) No results for input(s): PROBNP in the last 8760  hours.   D-Dimer No results for input(s): DDIMER in the last 72 hours. Hemoglobin A1C No results for input(s): HGBA1C in the last 72 hours. Fasting Lipid Panel No results for input(s): CHOL, HDL, LDLCALC, TRIG, CHOLHDL, LDLDIRECT in the last 72 hours. Thyroid Function Tests No results for input(s): TSH, T4TOTAL, T3FREE, THYROIDAB in the last 72 hours.  Invalid input(s): FREET3  Other results:   Imaging    CT HEAD WO CONTRAST  Result Date: 08/15/2020 CLINICAL DATA:  Delirium, acute encephalopathy, CABG, multiple infarcts EXAM: CT HEAD WITHOUT CONTRAST TECHNIQUE: Contiguous axial images were obtained from the base of the skull through the vertex without intravenous contrast. COMPARISON:  08/17/2020 FINDINGS: Brain: No evidence of new infarction, hemorrhage, hydrocephalus, extra-axial collection or mass lesion/mass effect. Hypodensity of the right cerebellar pedicle is again noted (series 7, image 10); other previously seen parenchymal hypodensities less well appreciated. Vascular: No hyperdense vessel or unexpected calcification. Skull: Normal. Negative for fracture or focal lesion. Sinuses/Orbits: No acute finding. Other: None. IMPRESSION: 1. No acute intracranial pathology. 2. Hypodensity of the right cerebellar pedicle is again noted; other previously seen parenchymal hypodensities less well appreciated. Findings are generally in keeping with expected evolution of small subacute infarctions. No evidence of hemorrhage or other complication. MRI is more sensitive for the assessment of acutely superimposed diffusion restricting infarction if clinically suspected. Electronically Signed   By: Danny Carpenter M.D.   On: 08/15/2020 11:22   ECHOCARDIOGRAM LIMITED  Result Date: 08/15/2020    ECHOCARDIOGRAM LIMITED REPORT   Patient Name:   Danny Carpenter Date of Exam: 08/15/2020 Medical Rec #:  [id number]     Height:       73.0 in Accession #:    [id number]    Weight:       243.4 lb Date of Birth:   08/19/1950    BSA:          2.339 m Patient Age:    38 years      BP:           130/67 mmHg Patient Gender: M             HR:           80 bpm. Exam Location:  Inpatient Procedure: Limited Echo Indications:    CHF-Acute Systolic J85.63  History:        Patient has prior history of Echocardiogram examinations, most  recent 08/10/2020. Signs/Symptoms:Fever. Cardiogenic shock:                 Ischemic cardiomyopathy, post-CABG on 4/26. Impella 5.5 down to                 P4. Thrombocytopenia, Anemia, AAA, Acute kidney injury.  Sonographer:    Darlina Sicilian RDCS Referring Phys: [place]  Conclusion(s)/Recommendation(s): LIMITED ECHO for Impella cannula placement. Left Ventricle: Left ventricular ejection fraction, by estimation, is <20%. The left ventricle has severely decreased function. Compared to prior, Similar cannular placement (5.2 cm from the annulus). Rudean Haskell MD Electronically signed by Rudean Haskell MD Signature Date/Time: 08/15/2020/11:01:39 AM    Final    [place] EKG SITE RITE  Result Date: 08/15/2020 If Site Rite image not attached, placement could not be confirmed due to current cardiac rhythm.  CT CHEST ABDOMEN PELVIS WO CONTRAST  Result Date: 08/15/2020 CLINICAL DATA:  Sepsis, recent CABG EXAM: CT CHEST, ABDOMEN AND PELVIS WITHOUT CONTRAST TECHNIQUE: Multidetector CT imaging of the chest, abdomen and pelvis was performed following the standard protocol without IV contrast. COMPARISON:  09/28/2018 FINDINGS: CT CHEST FINDINGS Cardiovascular: Aortic atherosclerosis. Cardiomegaly status post median sternotomy and CABG. Extensive 3 vessel coronary artery calcifications and/or stents. Right subclavian approach Impella device position within the left ventricle. Right internal jugular and left subclavian venous catheters. No pericardial effusion. Mediastinum/Nodes: No enlarged mediastinal, hilar, or axillary lymph nodes. Tracheostomy. Thyroid and esophagus  demonstrate no significant findings. Lungs/Pleura: Small, right greater than left bilateral pleural effusions and associated atelectasis or consolidation. Musculoskeletal: No chest wall mass or suspicious bone lesions identified. Status post median sternotomy and CABG with postoperative retrosternal stranding and fluid (series 3, image 26). There is no discrete fluid collection identified. Superficial metallic pellets in the left anterior chest wall. CT ABDOMEN PELVIS FINDINGS Hepatobiliary: No solid liver abnormality is seen. The gallbladder is mildly distended with some suggestion of wall thickening and dependent calcified sludge. No discrete gallstones identified. No biliary ductal dilatation. Pancreas: Unremarkable. No pancreatic ductal dilatation or surrounding inflammatory changes. Spleen: Normal in size without significant abnormality. Adrenals/Urinary Tract: Adrenal glands are unremarkable. Small nonobstructive calculus of the superior pole of the right kidney. No hydronephrosis. Bladder is decompressed by a Foley catheter. Thickening of the urinary bladder wall. Stomach/Bowel: Stomach is within normal limits. Enteric feeding tube with tip in the duodenal bulb. Appendix is not clearly visualized. No evidence of bowel wall thickening, distention, or inflammatory changes. Rectal tube. Vascular/Lymphatic: Aortic atherosclerosis. Redemonstrated calcified aneurysm of the infrarenal abdominal aorta measuring 4.5 x 4.3 cm (series 3, image 80). No enlarged abdominal or pelvic lymph nodes. Reproductive: No mass or other abnormality. Other: No abdominal wall hernia or abnormality. Anasarca. Trace ascites throughout the abdomen and pelvis. Musculoskeletal: No acute or significant osseous findings. There is a small hematoma within the left psoas muscle body, measuring approximately 5.5 x 3.6 x 3.1 cm (series 3, image 88, series 6, image 122). IMPRESSION: 1. Status post median sternotomy and CABG with postoperative  retrosternal stranding and fluid. There is no discrete fluid collection or other evidence of postoperative complication identified. 2. The gallbladder is mildly distended with some suggestion of wall thickening and dependent calcified sludge. No discrete gallstones identified. If there is clinical concern for acute cholecystitis, right upper quadrant ultrasound or HIDA may be useful to further evaluate. 3. There is a small hematoma within the left psoas muscle body, measuring approximately 5.5 x 3.6 x 3.1 cm. 4. Small, right greater  than left bilateral pleural effusions and associated atelectasis or consolidation. 5. Trace ascites and anasarca. 6. Extensive support apparatus as detailed above including tracheostomy and Impella device. 7. Redemonstrated calcified aneurysm of the infrarenal abdominal aorta measuring 4.5 x 4.3 cm. Recommend follow-up CT/MR every 6 months and vascular consultation when clinically appropriate. This recommendation follows ACR consensus guidelines: White Paper of the ACR Incidental Findings Committee II on Vascular Findings. J Am Coll Radiol 2013; 10:789-794. Aortic Atherosclerosis (ICD10-I70.0). Electronically Signed   By: Danny Carpenter M.D.   On: 08/15/2020 11:39   US Abdomen Limited RUQ (LIVER/GB)  Result Date: 08/16/2020 CLINICAL DATA:  Fever EXAM: ULTRASOUND ABDOMEN LIMITED RIGHT UPPER QUADRANT COMPARISON:  CT Aug 15, 2020 FINDINGS: Gallbladder: Sludge in a dilated gallbladder with wall thickening measuring 3.5 mm and trace pericholecystic fluid. No cholelithiasis visualized. Common bile duct: Diameter: 3 mm Liver: No focal lesion identified. Within normal limits in parenchymal echogenicity. Portal vein is patent on color Doppler imaging with normal direction of blood flow towards the liver. Other: None. IMPRESSION: 1. Sludge in a dilated gallbladder with wall thickening and trace pericholecystic fluid. No cholelithiasis visualized. Findings are suspicious for acute acalculous  cholecystitis. Electronically Signed   By: Dahlia Bailiff MD   On: 08/16/2020 03:35     Medications:     Scheduled Medications: . aspirin EC  325 mg Oral Daily   Or  . aspirin  324 mg Per Tube Daily  . chlorhexidine gluconate (MEDLINE KIT)  15 mL Mouth Rinse BID  . Chlorhexidine Gluconate Cloth  6 each Topical Daily  . darbepoetin (ARANESP) injection - NON-DIALYSIS  150 mcg Subcutaneous Q Wed-1800  . feeding supplement (PROSource TF)  45 mL Per Tube 5 X Daily  . ferrous [id number] C-folic acid  1 capsule Oral BID PC  . fiber  1 packet Per Tube BID  . Gerhardt's butt cream   Topical QID  . insulin aspart  3 Units Subcutaneous Q4H  . insulin aspart  3-9 Units Subcutaneous Q4H  . insulin detemir  29 Units Subcutaneous BID  . levalbuterol  0.63 mg Nebulization TID  . mouth rinse  15 mL Mouth Rinse 10 times per day  . mexiletine  200 mg Per Tube Q12H  . midodrine  10 mg Per Tube TID WC  . pantoprazole sodium  40 mg Per Tube Daily  . rosuvastatin  10 mg Per Tube Daily  . sodium chloride flush  10-40 mL Intracatheter Q12H    Infusions: . sodium chloride Stopped (08/15/20 1637)  . sodium chloride    . sodium chloride    . sodium chloride    . anidulafungin Stopped (08/15/20 1013)  . dextrose Stopped (08/16/20 0137)  . DOBUTamine 2.5 mcg/kg/min (08/16/20 0700)  . feeding supplement (VITAL 1.5 CAL) 55 mL/hr at 08/16/20 0700  . fentaNYL infusion INTRAVENOUS 100 mcg/hr (08/16/20 0700)  . heparin 1,150 Units/hr (08/16/20 0714)  . impella catheter heparin 50 unit/mL in dextrose 5% 10.5 mL/hr at 08/09/20 2227  . norepinephrine (LEVOPHED) Adult infusion 2 mcg/min (08/16/20 0745)  . potassium chloride      PRN Medications: sodium chloride, Place/Maintain arterial line **AND** sodium chloride, acetaminophen (TYLENOL) oral liquid 160 mg/5 mL, fentaNYL (SUBLIMAZE) injection, heparin, polyvinyl alcohol, sodium chloride flush  Assessment/Plan   1. Cardiogenic shock: Ischemic  cardiomyopathy, post-CABG on 4/26.  He has Impella 5.5 in place, down to P4 with no alarms and stable LDH.  Limited echo 4/29 with EF< 20%, the RV appears normal in size  with severe systolic dysfunction. Now on NE 10 and dobutamine 2.5. Echo with some improvement, EF in 30-35% range on 5/9 echo. ?Component of septic/distributive shock with increased pressor requirement over the last couple days. Had torsades arrest on 5/14 and paced out. Now Full Code again. No further VT since 5/14. Impella down to P-3. On DBA 2.5. off NE. Co-ox 69%  - Continue dobutamine 2.5.  - Plan to remove impella after gallbladder is drained.  - Heparin gtt for Impella.  - Off CVVHD currently. Had good urine output yesterday. SCr down-trending. BUN 80>>83 2. CAD: s/p CABG x 5 with LIMA-LAD, seq SVG-D1/ramus, seq SVG-PDA/PLV.   - ASA - Crestor - No current s/s ischemia 3. Anemia: Post-op bleeding, back to OR with multiple products given on 4/26 post-CABG.  Hgb 9.0 - Transfuse < 7.5    4. Thrombocytopenia: Resolved. suspect low post-op/post-surgical bleeding and multiple blood products as well as sepsis.  5. PAD: Extensive history.  6. AAA: Monitoring as outpatient.  7. Type 2 DM: Insulin.  Hgb A1c was 11.6, poor control.  8. Atrial fibrillation: Rate controlled.   - Off amio with torsades and possible QT prolongation 9. Neuro: intubated and sedated, not following commands with sedation wean.  Head CT 5/6 with multiple posterior circulation infarcts. Head CT 5/10 no change.  - awake. Per nursing staff, intermittently following commands. - Neuro following - Check NH3 - Palliative Care following.  10. ID:  PCT 7.5 on 4/29. Cultures so far negative. Eraxis added 5/16 for persistent fevers. mTemp overnight 99.1 - d/w ID. Plan to pull Lt subclavian CVC today and replace w/ PICC - source of fever also likely 2/2  acute acalculous cholecystitis. Plan bilary drain placement per IR today - On Eraxis  - Start Zosyn for GN  coverage w/ bilary source 11. Acute hypoxemic respiratory failure: S/p tracheostomy on 5/10.  ?PNA, PCT 1.19 on 5/10.  -currently off meropenem.   - trach management per PCCM  12. AKI:  CVVH started with marked volume overload and worsening renal function.  Avoid hypotension and excessive CVVH rates. - On CVVHD holiday - Good urine output  BUN stable today. SCr down trending. Renal following 13. Ileus: Improved 14. Torsades - paced out by Dr Orvan Seen on 5/14 - No recurrence. Now off lidocaine gtt - continue Mexiletine  - remains A paced with intermittent capture. 66. GOC - was limited code and now changed back to Full Code 16. Acute acalculous cholecystitis  - d/w PCCM. Plan bilary drain placement today by IR - Zosyn for biliary coverage.  17. AAA: 4.5 cm on CT.   Lyda Jester, [place] 08/16/2020  Patient seen with PA, agree with the above note.   Off RRT, made 2340 cc urine yesterday with stable BUN, creatinine down to 3.98. CVP 6-7, co-ox 69%. He remains on dobutamine 2.5 and is off NE.  Tm 99, on Eraxis for fungal coverage.  CT abdomen and abdominal US both suggestive of acalculous cholecystitis.   Impella remains in place at P3.  LDH lower at 396.   Pacing turned off, HR in 70s-80s appears to be atrial fibrillation.   No further VT, on mexiletine.    Trach, FiO2 0.4.  Withdraws from pain.   General: eyes open Neck: No JVD, no thyromegaly or thyroid nodule. Trach Lungs: Decreased at bases.  CV: Sternotomy.  Heart irregular S1/S2, no S3/S4, no murmur.  Trace ankle edema.   Abdomen: Soft, nontender, no hepatosplenomegaly, no distention.  Skin: Intact without  lesions or rashes.  Neurologic: Movement, hard to tell if purposeful.  Extremities: No clubbing or cyanosis.  HEENT: Normal.    Continue dobutamine 2.5 and off NE.  Impella position stable on yesterday's echo, down to P3.  LDH lower, doubt significant hemolysis.  Think higher LDH yesterday likely due to  sepsis.  Hopefully remove Impella after cholecystostomy tube placed today.    Good UOP, creatinine lower. CVP 6-7. Will give Lasix 40 mg IV x 1 today to try to keep I/Os near even. Appears to have some renal recovery at this point.   No fever today.  Cultures negative.  Remains on Eraxis.  CT and [place] consistent with acalculous cholecystitis.   - Continue Eraxis for now.  - Add Zosyn for biliary coverage.  - Cholecystostomy tube today.   Underlying rhythm appears atrial fibrillation in 70s-80s.  Does not appear to need pacing, suspect can remove wires soon. Confirm with ECG.   Still difficult to tell neurological status, withdraws from pain per nursing.  CT head 5/17 unchanged. Check NH3.   CRITICAL CARE Performed by: Loralie Champagne  Total critical care time: 40 minutes  Critical care time was exclusive of separately billable procedures and treating other patients.  Critical care was necessary to treat or prevent imminent or life-threatening deterioration.  Critical care was time spent personally by me on the following activities: development of treatment plan with patient and/or surrogate as well as nursing, discussions with consultants, evaluation of patient's response to treatment, examination of patient, obtaining history from patient or surrogate, ordering and performing treatments and interventions, ordering and review of laboratory studies, ordering and review of radiographic studies, pulse oximetry and re-evaluation of patient's condition.  Loralie Champagne 08/16/2020 9:24 AM

## 2020-08-16 NOTE — Progress Notes (Signed)
22 Days Post-Op Procedure(s) (LRB): EXPLORATION POST OPERATIVE OPEN HEART (N/A) Subjective: Remains sedated Objective: Vital signs in last 24 hours: Temp:  [98.4 F (36.9 C)-101.48 F (38.6 C)] 98.78 F (37.1 C) (05/18 0800) Pulse Rate:  [76-96] 89 (05/18 0800) Cardiac Rhythm: A-V Sequential paced (05/18 0800) Resp:  [17-28] 20 (05/18 0800) BP: (111-167)/(56-99) 144/68 (05/18 0800) SpO2:  [99 %-100 %] 100 % (05/18 0800) Arterial Line BP: (110-181)/(44-72) 133/60 (05/18 0800) FiO2 (%):  [40 %] 40 % (05/18 0400) Weight:  [114.3 kg] 114.3 kg (05/18 0500)  Hemodynamic parameters for last 24 hours: CVP:  [5 mmHg-12 mmHg] 5 mmHg  Intake/Output from previous day: 05/17 0701 - 05/18 0700 In: 3392 [I.V.:807.3; NG/GT:2210; IV Piggyback:130] Out: 3300 [Urine:2340; Emesis/NG output:60; Stool:900] Intake/Output this shift: Total I/O In: 36.6 [I.V.:26; Other:10.6] Out: -   General appearance: no distress Neurologic: moving extremities spontaneously, inconsistently to command Heart: regular rate and rhythm, S1, S2 normal, no murmur, click, rub or gallop Lungs: clear to auscultation bilaterally Abdomen: abnormal findings:  distended Extremities: edema mild Wound: c/d/i  Lab Results: Recent Labs    08/15/20 0413 08/16/20 0429  WBC 9.7 9.4  HGB 9.3* 9.0*  HCT 30.0* 29.2*  PLT 266 255   BMET:  Recent Labs    08/15/20 0413 08/16/20 0429  NA 139 140  K 3.5 3.5  CL 100 103  CO2 26 26  GLUCOSE 229* 131*  BUN 80* 83*  CREATININE 4.89* 3.98*  CALCIUM 7.7* 7.6*    PT/INR: No results for input(s): LABPROT, INR in the last 72 hours. ABG    Component Value Date/Time   PHART 7.598 (H) 08/11/2020 0356   HCO3 51.2 (H) 08/11/2020 0356   TCO2 43 (H) 08/11/2020 0742   ACIDBASEDEF 2.0 08/05/2020 0400   O2SAT 69.4 08/16/2020 0429   CBG (last 3)  Recent Labs    08/15/20 2005 08/15/20 2346 08/16/20 0436  GLUCAP 151* 130* 145*    Assessment/Plan: S/P Procedure(s)  (LRB): EXPLORATION POST OPERATIVE OPEN HEART (N/A) plan IR consult for cholecystostomy tube  Leave impella until gb drained; hold anticoagulation according to IR specifications   LOS: 27 days    Wonda Olds 08/16/2020

## 2020-08-16 NOTE — Progress Notes (Addendum)
[facility] Progress Note Patient Name: Danny Carpenter DOB: [birth year] MRN: [id number]   Date of Service  08/16/2020  HPI/Events of Note  Blood pressure soft (MAP 60)  eICU Interventions  Peripheral Norepinephrine ordered targeting a MAP > 65 mmHg.     Intervention Category Intermediate Interventions: Hypotension - evaluation and management  Frederik Pear 08/16/2020, 7:18 AM

## 2020-08-16 NOTE — Progress Notes (Signed)
Lower extremity venous has been completed.   Preliminary results in CV Proc.   Abram Sander 08/16/2020 11:28 AM

## 2020-08-16 NOTE — Progress Notes (Signed)
eLink Physician-Brief Progress Note Patient Name: Danny Carpenter DOB: April 14, 1950 MRN: [id number]   Date of Service  08/16/2020  HPI/Events of Note  CXR reviewed.  eICU Interventions  PICC in good position. No pneumothorax.        Frederik Pear 08/16/2020, 7:53 PM

## 2020-08-16 NOTE — Progress Notes (Signed)
Peripherally Inserted Central Catheter Placement  The IV Nurse has discussed with the patient and/or persons authorized to consent for the patient, the purpose of this procedure and the potential benefits and risks involved with this procedure.  The benefits include less needle sticks, lab draws from the catheter, and the patient may be discharged home with the catheter. Risks include, but not limited to, infection, bleeding, blood clot (thrombus formation), and puncture of an artery; nerve damage and irregular heartbeat and possibility to perform a PICC exchange if needed/ordered by physician.  Alternatives to this procedure were also discussed.  Bard Power PICC patient education guide, fact sheet on infection prevention and patient information card has been provided to patient /or left at bedside.    PICC Placement Documentation  PICC Triple Lumen [id number] PICC Left Basilic 47 cm 0 cm (Active)  Indication for Insertion or Continuance of Line Vasoactive infusions 08/16/20 1755  Exposed Catheter (cm) 0 cm 08/16/20 1755  Site Assessment Clean;Dry;Intact 08/16/20 1755  Lumen #1 Status Flushed;Blood return noted 08/16/20 1755  Lumen #2 Status Flushed;Blood return noted 08/16/20 1755  Lumen #3 Status Flushed;Blood return noted 08/16/20 1755  Dressing Type Transparent 08/16/20 1755  Dressing Status Clean;Dry;Intact 08/16/20 1755  Antimicrobial disc in place? Yes 08/16/20 1755  Dressing Intervention New dressing;Other (Comment) 08/16/20 1755  Dressing Change Due 08/23/20 08/16/20 1755   Telephone consent signed by  Daughter    Danny Carpenter 08/16/2020, 5:56 PM

## 2020-08-16 NOTE — Progress Notes (Signed)
At bedside to do PICC insertion but patient will go to IR for procedure per RN.

## 2020-08-16 NOTE — Progress Notes (Signed)
      [id number]       [place] [address]             [phone]      Sedated on ventilator  BP (!) 131/101   Pulse 90   Temp 98.1 F (36.7 C) (Oral)   Resp (!) 23   Ht '6\' 1"'$  (1.854 m)   Wt 114.3 kg   SpO2 100%   BMI 33.25 kg/m  Dobutamine 2.5, norepi 1 mcg/min PRVC 20, 40%, 5 PEEP  Intake/Output Summary (Last 24 hours) at 08/16/2020 1817 Last data filed at 08/16/2020 1700 Gross per 24 hour  Intake 1923.33 ml  Output 1725 ml  Net 198.33 ml  no PM labs  Nemesio Castrillon C. Roxan Hockey, MD [facility] [phone]

## 2020-08-16 NOTE — Consult Note (Signed)
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 ventricle has moderately reduced systolic function. The cavity was dialated. There is no increase in right ventricular wall thickness. Left Atrium: Left atrial size was dilated. No left atrial/left atrial appendage thrombus was detected. Right Atrium: Right atrial size was dilated. Interatrial Septum: No  atrial level shunt detected by color flow Doppler. Pericardium: There is no evidence of pericardial effusion. Mitral Valve: The mitral valve is normal in structure. Mitral valve regurgitation is trivial by color flow Doppler. There is No evidence of mitral stenosis. Tricuspid Valve: The tricuspid valve was normal in structure. Tricuspid valve regurgitation is trivial by color flow Doppler. Aortic Valve: The aortic valve is tricuspid Aortic valve regurgitation was not visualized by color flow Doppler. There is no stenosis of the aortic valve. There is mild calcification present on the aortic valve right coronary cusp with mildly decreased mobility. Pulmonic Valve: The pulmonic valve was normal in structure. Pulmonic valve regurgitation is not visualized by color flow Doppler. Aorta: There is evidence of plaque in the descending aorta; Grade IV, measuring >33m in size. +--------------+--------++ LEFT VENTRICLE         +--------------+--------++ PLAX 2D                +--------------+--------++ LVOT diam:    2.10 cm  +--------------+--------++ LVOT Area:    3.46 cm +--------------+--------++                        +--------------+--------++ +-------------+------------++ AORTIC VALVE              +-------------+------------++ AV Vmax:     160.00 cm/s  +-------------+------------++ AV Vmean:    103.000 cm/s +-------------+------------++ AV VTI:      0.285 m      +-------------+------------++ AV Peak Grad:10.2 mmHg    +-------------+------------++ AV Mean Grad:5.0 mmHg     +-------------+------------++  +--------------+-------+ SHUNTS                +--------------+-------+ Systemic Diam:2.10 cm +--------------+-------+  RSuzette BattiestMD Electronically signed by RSuzette BattiestMD Signature Date/Time: 07/27/2020/7:57:21 PM    Final    VAS [place]PL[place] PRE CABG  Result Date: 07/24/2020 PREOPERATIVE VASCULAR EVALUATION [facility]  Date of Exam:    07/24/2020 Medical Rec #: [id number]     Accession #:    [id number] of Birth: 1May 09, 1952    Patient Gender: M Patient Age:   69Y [facility]      [facility] [place] PRE CABG Referring Phys: [id number]--------------------------------------------------------------------------------  Indications:            Pre-CABG. Risk Factors:           Hypertension, hyperlipidemia, Diabetes, past history of  smoking, PAD. Other Factors:          Left transmetatarsal amputation 08/06/2019. Vascular Interventions: 01/27/2019- left femoral artery and profunda femoral                         artery endarterectomy, left femoral-popliteal artery                         bypass graft. Comparison Study:       ABI 05/13/2019- right ABI/TBI= 0.73/0.69, left ABI/TBI=                         0.89/0.38 Performing Technologist: Maudry Mayhew MHA, RVT, RDCS, RDMS  Examination Guidelines: A complete evaluation includes B-mode imaging, spectral Doppler, color Doppler, and power Doppler as needed of all accessible portions of each vessel. Bilateral testing is considered an integral part of a complete examination. Limited examinations for reoccurring indications may be performed as noted.  Right Carotid Findings: +----------+--------+--------+--------+------------------------------+--------+           PSV cm/sEDV cm/sStenosisDescribe                      Comments +----------+--------+--------+--------+------------------------------+--------+ CCA Prox  53      6               smooth and heterogenous                +----------+--------+--------+--------+------------------------------+--------+ CCA Distal42      9               focal, smooth and heterogenous         +----------+--------+--------+--------+------------------------------+--------+ ICA Prox  67      29      1-39%                                           +----------+--------+--------+--------+------------------------------+--------+ ICA Distal84      33                                            tortuous +----------+--------+--------+--------+------------------------------+--------+ ECA       38                      smooth and heterogenous                +----------+--------+--------+--------+------------------------------+--------+ Portions of this table do not appear on this page. +----------+--------+-------+----------------+------------+           PSV cm/sEDV cmsDescribe        Arm Pressure +----------+--------+-------+----------------+------------+ [id number]             Multiphasic, WNL             +----------+--------+-------+----------------+------------+ +---------+--------+--+--------+--+---------+ VertebralPSV cm/s51EDV cm/s11Antegrade +---------+--------+--+--------+--+---------+ Left Carotid Findings: +----------+--------+--------+--------+------------------------------+--------+           PSV cm/sEDV cm/sStenosisDescribe                      Comments +----------+--------+--------+--------+------------------------------+--------+ CCA Prox  79      16                                                     +----------+--------+--------+--------+------------------------------+--------+  CCA Distal69      16              focal, smooth and heterogenous         +----------+--------+--------+--------+------------------------------+--------+ ICA Prox  146     44      40-59%  heterogenous and calcific              +----------+--------+--------+--------+------------------------------+--------+ ICA Distal100     28                                                     +----------+--------+--------+--------+------------------------------+--------+ ECA       53      5               heterogenous and calcific               +----------+--------+--------+--------+------------------------------+--------+ +----------+--------+--------+----------------+------------+ SubclavianPSV cm/sEDV cm/sDescribe        Arm Pressure +----------+--------+--------+----------------+------------+           153             Multiphasic, WNL             +----------+--------+--------+----------------+------------+ +---------+--------+--+--------+--+---------+ VertebralPSV cm/s39EDV cm/s11Antegrade +---------+--------+--+--------+--+---------+  ABI Findings: +---------+------------------+-----+----------+--------+ Right    Rt Pressure (mmHg)IndexWaveform  Comment  +---------+------------------+-----+----------+--------+ Brachial 126                    triphasic          +---------+------------------+-----+----------+--------+ PTA      106               0.84 monophasic         +---------+------------------+-----+----------+--------+ DP       98                0.78 monophasic         +---------+------------------+-----+----------+--------+ [place]               1.09                    +---------+------------------+-----+----------+--------+ +---------+------------------+-----+----------+--------------------------------+ Left     Lt Pressure (mmHg)IndexWaveform  Comment                          +---------+------------------+-----+----------+--------------------------------+ Brachial 120                    triphasic                                  +---------+------------------+-----+----------+--------------------------------+ PTA      128               1.02 monophasic                                 +---------+------------------+-----+----------+--------------------------------+ DP       112               0.89 monophasic                                 +---------+------------------+-----+----------+--------------------------------+ Great Toe  Unable  to obtain pressure due to                                           amputation                       +---------+------------------+-----+----------+--------------------------------+ +-------+---------------+----------------+ ABI/TBIToday's ABI/TBIPrevious ABI/TBI +-------+---------------+----------------+ Right  0.84/1.09      0.73/0.69        +-------+---------------+----------------+ Left   1.02/-         0.89/0.38        +-------+---------------+----------------+  Right Doppler Findings: +-----------+--------+-----+---------+-----------------------------------------+ Site       PressureIndexDoppler  Comments                                  +-----------+--------+-----+---------+-----------------------------------------+ Brachial   126          triphasic                                          +-----------+--------+-----+---------+-----------------------------------------+ Radial                  triphasic                                          +-----------+--------+-----+---------+-----------------------------------------+ Ulnar                   triphasic                                          +-----------+--------+-----+---------+-----------------------------------------+ Palmar Arch                      Signal obliterates with radial                                             compression, is unaffected with ulnar                                      compression                               +-----------+--------+-----+---------+-----------------------------------------+  Left Doppler Findings: +-----------+--------+-----+---------+-----------------------------------------+ Site       PressureIndexDoppler  Comments                                  +-----------+--------+-----+---------+-----------------------------------------+ Brachial   120          triphasic                                           +-----------+--------+-----+---------+-----------------------------------------+ Radial  triphasic                                          +-----------+--------+-----+---------+-----------------------------------------+ Ulnar                   triphasic                                          +-----------+--------+-----+---------+-----------------------------------------+ Palmar Arch                      Signal is unaffected with radial                                           compression, decreases 50% with ulnar                                      compression.                              +-----------+--------+-----+---------+-----------------------------------------+  Summary: Right Carotid: Velocities in the right ICA are consistent with a 1-39% stenosis. Left Carotid: Velocities in the left ICA are consistent with a 40-59% stenosis. Vertebrals:  Bilateral vertebral arteries demonstrate antegrade flow. Subclavians: Normal flow hemodynamics were seen in bilateral subclavian              arteries. Right ABI: Resting right ankle-brachial index indicates mild right lower extremity arterial disease. The right toe-brachial index is normal. Left ABI: Resting left ankle-brachial index is within normal range. No evidence of significant left lower extremity arterial disease. Right Upper Extremity: No significant arterial obstruction detected in the right upper extremity. Left Upper Extremity: No significant arterial obstruction detected in the left upper extremity.  Electronically signed by Ruta Hinds MD on 07/24/2020 at 4:12:13 PM.    Final    VAS [place] LOWER EXTREMITY VENOUS (DVT)  Result Date: 08/11/2020  Lower Venous DVT Study Patient [facility]:  [facility]  Date of Exam:   08/11/2020 Medical Rec #: [id number]      Accession #:    [id number] Date of Birth: 1950-07-02     Patient Gender: M Patient Age:   69Y Exam Location:  [facility] Procedure:      VAS [place]  LOWER EXTREMITY VENOUS (DVT) Referring Phys: [id number] [place] --------------------------------------------------------------------------------  Indications: Edema. Other Indications: Status post CABG 4/26 with right leg vein harvested. Limitations: Poor ultrasound/tissue interface. Comparison Study: No prior Performing Technologist: Oda Cogan RDMS, RVT  Examination Guidelines: A complete evaluation includes B-mode imaging, spectral Doppler, color Doppler, and power Doppler as needed of all accessible portions of each vessel. Bilateral testing is considered an integral part of a complete examination. Limited examinations for reoccurring indications may be performed as noted. The reflux portion of the exam is performed with the patient in reverse Trendelenburg.  +---------+---------------+---------+-----------+----------+--------------+ RIGHT    CompressibilityPhasicitySpontaneityPropertiesThrombus Aging +---------+---------------+---------+-----------+----------+--------------+ CFV      Full           Yes      Yes                                 +---------+---------------+---------+-----------+----------+--------------+  SFJ      Full                                                        +---------+---------------+---------+-----------+----------+--------------+ FV Prox  Full                                                        +---------+---------------+---------+-----------+----------+--------------+ FV Mid   Full                                                        +---------+---------------+---------+-----------+----------+--------------+ FV DistalFull                                                        +---------+---------------+---------+-----------+----------+--------------+ PFV      Full                                                        +---------+---------------+---------+-----------+----------+--------------+ POP      Full            Yes      Yes                                 +---------+---------------+---------+-----------+----------+--------------+ PTV      Full                                                        +---------+---------------+---------+-----------+----------+--------------+ PERO     Full                                                        +---------+---------------+---------+-----------+----------+--------------+ Hematomas are noted along the incision site in the thigh area.  +---------+---------------+---------+-----------+----------+--------------+ LEFT     CompressibilityPhasicitySpontaneityPropertiesThrombus Aging +---------+---------------+---------+-----------+----------+--------------+ CFV      Full           Yes      Yes                                 +---------+---------------+---------+-----------+----------+--------------+ SFJ      Full                                                        +---------+---------------+---------+-----------+----------+--------------+  FV Prox  Full                                                        +---------+---------------+---------+-----------+----------+--------------+ FV Mid   Full                                                        +---------+---------------+---------+-----------+----------+--------------+ FV DistalFull                                                        +---------+---------------+---------+-----------+----------+--------------+ PFV      Full                                                        +---------+---------------+---------+-----------+----------+--------------+ POP      Full           Yes      Yes                                 +---------+---------------+---------+-----------+----------+--------------+ PTV      Full                                                        +---------+---------------+---------+-----------+----------+--------------+ PERO      Full                                                        +---------+---------------+---------+-----------+----------+--------------+     Summary: BILATERAL: - No evidence of deep vein thrombosis seen in the lower extremities, bilaterally. -No evidence of popliteal cyst, bilaterally.   *See table(s) above for measurements and observations. Electronically signed by Servando Snare MD on 08/11/2020 at 4:56:30 PM.    Final    ECHOCARDIOGRAM LIMITED  Result Date: 08/15/2020    ECHOCARDIOGRAM LIMITED REPORT   Patient Name:   Danny Carpenter Date of Exam: 08/15/2020 Medical Rec #:  [id number]     Height:       73.0 in Accession #:    [id number]    Weight:       243.4 lb Date of Birth:  1951/03/30    BSA:          2.339 m Patient Age:    37 years      BP:           130/67 mmHg Patient Gender: M             HR:  80 bpm. Exam Location:  Inpatient Procedure: Limited Echo Indications:    CHF-Acute Systolic [id number]  History:        Patient has prior history of Echocardiogram examinations, most                 recent 08/10/2020. Signs/Symptoms:Fever. Cardiogenic shock:                 Ischemic cardiomyopathy, post-CABG on 4/26. Impella 5.5 down to                 P4. Thrombocytopenia, Anemia, AAA, Acute kidney injury.  Sonographer:    Darlina Sicilian RDCS Referring Phys: [place]  Conclusion(s)/Recommendation(s): LIMITED ECHO for Impella cannula placement. Left Ventricle: Left ventricular ejection fraction, by estimation, is <20%. The left ventricle has severely decreased function. Compared to prior, Similar cannular placement (5.2 cm from the annulus). Rudean Haskell MD Electronically signed by Rudean Haskell MD Signature Date/Time: 08/15/2020/11:01:39 AM    Final    ECHOCARDIOGRAM LIMITED  Result Date: 08/27/2020    ECHOCARDIOGRAM LIMITED REPORT   Patient Name:   Danny Carpenter Date of Exam: 08/07/2020 Medical Rec #:  [id number]     Height:       73.0 in Accession #:    [id number]    Weight:        259.5 lb Date of Birth:  12-30-1950    BSA:          2.403 m Patient Age:    75 years      BP:           138/63 mmHg Patient Gender: M             HR:           74 bpm. Exam Location:  Inpatient Procedure: Limited Echo Indications:    Impella positioning  History:        Patient has prior history of Echocardiogram examinations, most                 recent 08/03/2020. CHF and Cardiomyopathy, CAD, Prior CABG, PAD;                 Risk Factors:Hypertension. Cardiogenic shock.  Sonographer:    Dustin Flock Referring Phys: [id number] [place] ECHO for Impella cannula placement. Sanda Klein MD Electronically signed by Sanda Klein MD Signature Date/Time: 08/22/2020/9:06:09 AM    Final    ECHOCARDIOGRAM LIMITED  Result Date: 08/03/2020    ECHOCARDIOGRAM LIMITED REPORT   Patient Name:   Danny Carpenter Date of Exam: 08/03/2020 Medical Rec #:  [id number]     Height:       73.0 in Accession #:    [id number]    Weight:       276.2 lb Date of Birth:  08-18-1950    BSA:          2.468 m Patient Age:    37 years      BP:           118/79 mmHg Patient Gender: M             HR:           73 bpm. Exam Location:  Inpatient Procedure: Limited Echo and Color Doppler Indications:    I50.23 Acute on chronic systolic (congestive) heart failure  History:        Patient has prior history of Echocardiogram examinations, most  recent 08/02/2020. COPD, Stroke and PAD; Risk                 Factors:Hypertension, Diabetes, Dyslipidemia and Sleep Apnea.                 Seizures. AAA. GERD.  Sonographer:    Jonelle Sidle Dance Referring Phys: [place]  1. Left ventricular ejection fraction, by estimation, is <20%. The left ventricle has severely decreased function. Limited echo for Impella positioning. Compared to prior study support device has been pulled out of the left ventircle. Measured at 4.75 cm from the annulus. FINDINGS  Left Ventricle: Left ventricular ejection fraction, by estimation,  is <20%. The left ventricle has severely decreased function. Rudean Haskell MD Electronically signed by Rudean Haskell MD Signature Date/Time: 08/03/2020/5:58:54 PM    Final    ECHOCARDIOGRAM LIMITED  Result Date: 08/02/2020    ECHOCARDIOGRAM LIMITED REPORT   Patient Name:   Danny Carpenter Date of Exam: 08/02/2020 Medical Rec #:  [id number]     Height:       73.0 in Accession #:    [id number]    Weight:       282.0 lb Date of Birth:  1951-01-06    BSA:          2.490 m Patient Age:    45 years      BP:           141/89 mmHg Patient Gender: M             HR:           92 bpm. Exam Location:  Inpatient Procedure: Limited Echo Indications:    Impella positioning  History:        Patient has prior history of Echocardiogram examinations, most                 recent 07/31/2020. CHF and Cardiomyopathy, CAD, Prior CABG, PAD,                 Arrythmias:Atrial Fibrillation; Risk Factors:Diabetes.  Sonographer:    Dustin Flock Referring Phys: [place]  1. Limited echo for Impella positioning EF 20% no effusion Normal RV size Impella seems to be positioned too deep as far as 7 cm distal to aortic annulus with turbulance from outflow near aortic annulus. FINDINGS  Additional Comments: Limited echo for Impella positioning EF 20% no effusion Normal RV size Impella seems to be positioned too deep as far as 7 cm distal to aortic annulus with turbulance from outflow near aortic annulus. Jenkins Rouge MD Electronically signed by Jenkins Rouge MD Signature Date/Time: 08/02/2020/10:52:34 AM    Final    ECHOCARDIOGRAM LIMITED  Result Date: 07/31/2020    ECHOCARDIOGRAM LIMITED REPORT   Patient Name:   Danny Carpenter Date of Exam: 07/31/2020 Medical Rec #:  [id number]     Height:       73.0 in Accession #:    [id number]    Weight:       280.9 lb Date of Birth:  02/14/1951    BSA:          2.486 m Patient Age:    66 years      BP:           113/81 mmHg Patient Gender: M             HR:           89 bpm. Exam  Location:  Inpatient Procedure: Limited Echo Indications:    [id number] Acute systolic (congestive) heart failure  History:        Patient has prior history of Echocardiogram examinations, most                 recent 07/29/2020. Prior CABG; Risk Factors:Hypertension,                 Diabetes and Dyslipidemia.  Sonographer:    Merrie Roof RDCS Referring Phys: [place] Comments: This was a limited echo for Impella position. The Impella device was not moved. IMPRESSIONS  1. Left ventricular ejection fraction, by estimation, is <20%. The left ventricle has severely decreased function with dyskinetic septum. The left ventricular internal cavity size was mildly dilated. Impella 5.5 catheter present with tip at 5.3 cm from the aortic valve.  2. Right ventricular systolic function is moderately reduced. The right ventricular size is normal. FINDINGS  Left Ventricle: Left ventricular ejection fraction, by estimation, is <20%. The left ventricle has severely decreased function. The left ventricular internal cavity size was mildly dilated. There is no left ventricular hypertrophy. Right Ventricle: The right ventricular size is normal. Right ventricular systolic function is moderately reduced. Loralie Champagne MD Electronically signed by Loralie Champagne MD Signature Date/Time: 07/31/2020/1:52:44 PM    Final    ECHOCARDIOGRAM LIMITED  Result Date: 07/29/2020    ECHOCARDIOGRAM LIMITED REPORT   Patient Name:   Danny Carpenter Date of Exam: 07/29/2020 Medical Rec #:  [id number]     Height:       73.0 in Accession #:    [id number]    Weight:       272.5 lb Date of Birth:  [birth year]    BSA:          2.454 m Patient Age:    35 years      BP:           98/75 mmHg Patient Gender: M             HR:           66 bpm. Exam Location:  Inpatient Procedure: Limited Echo                                STAT ECHO  Dr. Loralie Champagne present at bedside for Impella placement measurements. Indications:    CHF-Acute Systolic [id number]                  Impella placement  History:        Patient has prior history of Echocardiogram examinations, most                 recent 07/28/2028. CHF, CAD, Prior CABG; Risk                 Factors:Hypertension, Diabetes and Dyslipidemia. Stroke. Cardiac                 shock. Impella device.  Sonographer:    Clayton Lefort RDCS (AE) Referring Phys: [id number] [facility]  Sonographer Comments: Technically difficult study due to poor echo windows, patient is morbidly obese and echo performed with patient supine and on artificial respirator. Image acquisition challenging due to patient body habitus. IMPRESSIONS  1. Impella placement - 2 images. Initial depth of 5.11 cm past the aortic valve appears adequate. FINDINGS  Left Ventricle: Impella placement - 2 images. Initial depth of  5.11 cm past the aortic valve appears adequate. Lyman Bishop MD Electronically signed by Lyman Bishop MD Signature Date/Time: 07/29/2020/11:55:05 AM    Final    ECHOCARDIOGRAM LIMITED  Result Date: 07/28/2020    ECHOCARDIOGRAM LIMITED REPORT   Patient Name:   [facility] Date of Exam: 07/28/2020 Medical Rec #:  [id number]     Height:       73.0 in Accession #:    [id number]    Weight:       272.5 lb Date of Birth:  [birth year]    BSA:          2.454 m Patient Age:    63 years      BP:           90/56 mmHg Patient Gender: M             HR:           86 bpm. Exam Location:  Inpatient Procedure: Limited Echo and Color Doppler Indications:    I50.40* Unspecified combined systolic (congestive) and diastolic                 (congestive) heart failure.Limited echo for Impella placemant.  History:        Patient has prior history of Echocardiogram examinations, most                 recent 07/26/2020. CHF, CAD, Prior CABG, Stroke; Risk                 Factors:Hypertension, Diabetes and Dyslipidemia. Cardiac shock.                 Impella device.  Sonographer:    Roseanna Rainbow RDCS Referring Phys: [id number] AMY D CLEGG  Sonographer Comments: Technically difficult  study due to poor echo windows and echo performed with patient supine and on artificial respirator. Image acquisition challenging due to patient body habitus. IMPRESSIONS  1. Impella cathteter noted across the aortic valve and in the left ventricle. Initially 5.9 cm distal to the aortic valve annulus. The catheter was withdrawn to 4.8 cm. Left ventricular ejection fraction, by estimation, is <20%. The left ventricle has severely decreased function. The left ventricle demonstrates global hypokinesis.  2. Right ventricular systolic function is severely reduced. The right ventricular size is normal.  3. The mitral valve is grossly normal.  4. The aortic valve was not well visualized. FINDINGS  Left Ventricle: Impella cathteter noted across the aortic valve and in the left ventricle. Initially 5.9 cm distal to the aortic valve annulus. The catheter was withdrawn to 4.8 cm. Left ventricular ejection fraction, by estimation, is <20%. The left ventricle has severely decreased function. The left ventricle demonstrates global hypokinesis. The left ventricular internal cavity size was normal in size. Right Ventricle: The right ventricular size is normal. No increase in right ventricular wall thickness. Right ventricular systolic function is severely reduced. Left Atrium: Left atrial size was not assessed. Right Atrium: Right atrial size was not assessed. Pericardium: There is no evidence of pericardial effusion. Mitral Valve: The mitral valve is grossly normal. Aortic Valve: The aortic valve was not well visualized. Skeet Latch MD Electronically signed by Skeet Latch MD Signature Date/Time: 07/28/2020/11:58:19 AM    Final    ECHOCARDIOGRAM LIMITED  Result Date: 07/26/2020    ECHOCARDIOGRAM LIMITED REPORT   Patient Name:   [facility] Date of Exam: 07/26/2020 Medical Rec #:  [id number]     Height:  73.0 in Accession #:    [id number]    Weight:       280.0 lb Date of Birth:  1950-08-15    BSA:          2.482  m Patient Age:    77 years      BP:           99/76 mmHg Patient Gender: M             HR:           92 bpm. Exam Location:  Inpatient Procedure: Limited Echo Indications:    I50.23 Acute on chronic systolic (congestive) heart failure  History:        Patient has prior history of Echocardiogram examinations, most                 recent 07/13/2020. COPD, Stroke and PAD; Risk                 Factors:Hypertension, Diabetes, Dyslipidemia, Sleep Apnea and                 GERD. Abdominal Aortic Aneurysm.  Sonographer:    Tiffany Dance Referring Phys: [phone] AMY D CLEGG IMPRESSIONS  1. Limited echo to evaluate impella positioning. Impella measures 4.9 cm from aortic valve  2. Left ventricular ejection fraction, by estimation, is <20%. The left ventricle has severely decreased function.  3. Right ventricular systolic function is severely reduced. The right ventricular size is normal. FINDINGS  Left Ventricle: Left ventricular ejection fraction, by estimation, is <20%. The left ventricle has severely decreased function. Right Ventricle: The right ventricular size is normal. Right ventricular systolic function is severely reduced. Oswaldo Milian MD Electronically signed by Oswaldo Milian MD Signature Date/Time: 07/26/2020/10:48:24 AM    Final    [place] EKG SITE RITE  Result Date: 08/15/2020 If Site Rite image not attached, placement could not be confirmed due to current cardiac rhythm.  [place] EKG SITE RITE  Result Date: 08/02/2020 If Site Rite image not attached, placement could not be confirmed due to current cardiac rhythm.  CT CHEST ABDOMEN PELVIS WO CONTRAST  Result Date: 08/15/2020 CLINICAL DATA:  Sepsis, recent CABG EXAM: CT CHEST, ABDOMEN AND PELVIS WITHOUT CONTRAST TECHNIQUE: Multidetector CT imaging of the chest, abdomen and pelvis was performed following the standard protocol without IV contrast. COMPARISON:  09/28/2018 FINDINGS: CT CHEST FINDINGS Cardiovascular: Aortic atherosclerosis. Cardiomegaly  status post median sternotomy and CABG. Extensive 3 vessel coronary artery calcifications and/or stents. Right subclavian approach Impella device position within the left ventricle. Right internal jugular and left subclavian venous catheters. No pericardial effusion. Mediastinum/Nodes: No enlarged mediastinal, hilar, or axillary lymph nodes. Tracheostomy. Thyroid and esophagus demonstrate no significant findings. Lungs/Pleura: Small, right greater than left bilateral pleural effusions and associated atelectasis or consolidation. Musculoskeletal: No chest wall mass or suspicious bone lesions identified. Status post median sternotomy and CABG with postoperative retrosternal stranding and fluid (series 3, image 26). There is no discrete fluid collection identified. Superficial metallic pellets in the left anterior chest wall. CT ABDOMEN PELVIS FINDINGS Hepatobiliary: No solid liver abnormality is seen. The gallbladder is mildly distended with some suggestion of wall thickening and dependent calcified sludge. No discrete gallstones identified. No biliary ductal dilatation. Pancreas: Unremarkable. No pancreatic ductal dilatation or surrounding inflammatory changes. Spleen: Normal in size without significant abnormality. Adrenals/Urinary Tract: Adrenal glands are unremarkable. Small nonobstructive calculus of the superior pole of the right kidney. No hydronephrosis. Bladder is decompressed by a Foley catheter.  Thickening of the urinary bladder wall. Stomach/Bowel: Stomach is within normal limits. Enteric feeding tube with tip in the duodenal bulb. Appendix is not clearly visualized. No evidence of bowel wall thickening, distention, or inflammatory changes. Rectal tube. Vascular/Lymphatic: Aortic atherosclerosis. Redemonstrated calcified aneurysm of the infrarenal abdominal aorta measuring 4.5 x 4.3 cm (series 3, image 80). No enlarged abdominal or pelvic lymph nodes. Reproductive: No mass or other abnormality. Other: No  abdominal wall hernia or abnormality. Anasarca. Trace ascites throughout the abdomen and pelvis. Musculoskeletal: No acute or significant osseous findings. There is a small hematoma within the left psoas muscle body, measuring approximately 5.5 x 3.6 x 3.1 cm (series 3, image 88, series 6, image 122). IMPRESSION: 1. Status post median sternotomy and CABG with postoperative retrosternal stranding and fluid. There is no discrete fluid collection or other evidence of postoperative complication identified. 2. The gallbladder is mildly distended with some suggestion of wall thickening and dependent calcified sludge. No discrete gallstones identified. If there is clinical concern for acute cholecystitis, right upper quadrant ultrasound or HIDA may be useful to further evaluate. 3. There is a small hematoma within the left psoas muscle body, measuring approximately 5.5 x 3.6 x 3.1 cm. 4. Small, right greater than left bilateral pleural effusions and associated atelectasis or consolidation. 5. Trace ascites and anasarca. 6. Extensive support apparatus as detailed above including tracheostomy and Impella device. 7. Redemonstrated calcified aneurysm of the infrarenal abdominal aorta measuring 4.5 x 4.3 cm. Recommend follow-up CT/MR every 6 months and vascular consultation when clinically appropriate. This recommendation follows ACR consensus guidelines: White Paper of the ACR Incidental Findings Committee II on Vascular Findings. J Am Coll Radiol 2013; 10:789-794. Aortic Atherosclerosis (ICD10-I70.0). Electronically Signed   By: Eddie Candle M.D.   On: 08/15/2020 11:39   US Abdomen Limited RUQ (LIVER/GB)  Result Date: 08/16/2020 CLINICAL DATA:  Fever EXAM: ULTRASOUND ABDOMEN LIMITED RIGHT UPPER QUADRANT COMPARISON:  CT Aug 15, 2020 FINDINGS: Gallbladder: Sludge in a dilated gallbladder with wall thickening measuring 3.5 mm and trace pericholecystic fluid. No cholelithiasis visualized. Common bile duct: Diameter: 3 mm  Liver: No focal lesion identified. Within normal limits in parenchymal echogenicity. Portal vein is patent on color Doppler imaging with normal direction of blood flow towards the liver. Other: None. IMPRESSION: 1. Sludge in a dilated gallbladder with wall thickening and trace pericholecystic fluid. No cholelithiasis visualized. Findings are suspicious for acute acalculous cholecystitis. Electronically Signed   By: Dahlia Bailiff MD   On: 08/16/2020 03:35    Labs:  CBC: Recent Labs    08/13/20 1526 08/14/20 0428 08/15/20 0413 08/16/20 0429  WBC 8.6 8.0 9.7 9.4  HGB 9.2* 9.3* 9.3* 9.0*  HCT 28.9* 29.3* 30.0* 29.2*  PLT 303 284 266 255    COAGS: Recent Labs    07/17/2020 1121 07/17/2020 1617 07/24/2020 1933  INR 1.0 1.4* 1.5*  APTT  --  99* 41*    BMP: Recent Labs    08/13/20 1526 08/14/20 0428 08/15/20 0413 08/16/20 0429  NA 138 137 139 140  K 3.3* 3.2* 3.5 3.5  CL 92* 96* 100 103  CO2 '29 27 26 26  '$ GLUCOSE 95 142* 229* 131*  BUN 69* 72* 80* 83*  CALCIUM 7.9* 7.9* 7.7* 7.6*  CREATININE 4.87* 4.84* 4.89* 3.98*  GFRNONAA 12* 12* 12* 16*    LIVER FUNCTION TESTS: Recent Labs    07/26/20 1409 07/27/20 0304 07/30/20 0559 08/12/20 0252 08/12/20 1521 08/13/20 0326 08/16/20 0429  BILITOT 1.2 1.1  --   --  1.0  --  1.8*  AST 26 21  --   --  81*  --  303*  ALT 13 14  --   --  60*  --  105*  ALKPHOS 28* 38  --   --  155*  --  517*  PROT 4.9* 5.0*  --   --  6.0*  --  5.8*  ALBUMIN 3.4* 3.1*   < > 1.9* 2.0*  2.0* 2.0* 1.8*   < > = values in this interval not displayed.    TUMOR MARKERS: No results for input(s): AFPTM, CEA, CA199, CHROMGRNA in the last 8760 hours.  Assessment and Plan: 70 y.o. male with significant past medical history including AAA, anxiety, COPD, prior CVA, degenerative disc disease, depression, GERD, hypertension, ischemic cardiomyopathy/CHF, hypercholesterolemia, peripheral arterial disease with prior femoropopliteal bypass, prior seizure, sleep  apnea, diabetes and prior foot osteomyelitis/left TMA.  He presented to [facility] in April of this year with chest pain and shortness of breath.  He subsequently underwent cardiac cath showing multivessel coronary artery disease and has since undergone CABG x5.  His postop course was complicated by arrest post CABG, status post CPR, need for Impella and inotropes, open chest at bedside with hematoma evacuation and returned OR for exploration.  He also developed acute kidney injury requiring CRRT  ,had trach placement for resp failure and noted to have multiple posterior circulation infarcts on CT.  He has had intermittent fevers and leukocytosis while in ICU and has been treated with broad-spectrum antibiotics.  Recent imaging has showed some mild distention of the gallbladder with some suggestion of wall thickening and dependent calcified sludge but no discrete gallstones.  Current temperature 99, BP 144/81, heart rate 89, WBC normal, hemoglobin 9, platelets 255k, creatinine 3.98, total bilirubin 1.8, AST 303, ALT 105, alkaline phosphatase 517.  Patient currently on Zosyn, Levophed, midodrine IV heparin, dobutamine;  request now received from critical care team for percutaneous cholecystostomy.Imaging studies have been reviewed by Dr. Serafina Royals and case d/w Dr. Carlis Abbott. Risks and benefits discussed with the patient's daughter,Angel Melkonian, including bleeding, infection, damage to adjacent structures,  and sepsis.  All of the patient's questions were answered, patient's family is agreeable to proceed. Consent signed and in chart. Procedure scheduled for today.      Thank you for this interesting consult.  I greatly enjoyed meeting [facility] and look forward to participating in their care.  A copy of this report was sent to the requesting provider on this date.  Electronically Signed: D. Rowe Robert, PA-C 08/16/2020, 9:43 AM   I spent a total of 30 minutes  in face to face in clinical  consultation, greater than 50% of which was counseling/coordinating care for percutaneous cholecystostomy

## 2020-08-16 NOTE — Progress Notes (Deleted)
eLink Physician-Brief Progress Note Patient Name: Braxxton Gange DOB: 08/15/1950 MRN: [id number]   Date of Service  08/16/2020  HPI/Events of Note  Patient with a soft blood pressure (MAP 60).  eICU Interventions  Peripheral Norepinephrine ordered targeting a MAP > 65 mmHg.        Kerry Kass Payten Beaumier 08/16/2020, 7:13 AM

## 2020-08-16 NOTE — Procedures (Signed)
Interventional Radiology Procedure Note  Procedure: Cholecystostomy   Findings: Please refer to procedural dictation for full description. 10.2 Fr percutaneous, transhepatic cholecystostomy placed.  Approximately 200 mL dark, bilious aspirate.  Sample sent for culture.  Complications: None immediate  Estimated Blood Loss: < 5 mL  Recommendations: Keep to bag drainage. Follow up cultures. IR will follow.   Ruthann Cancer, MD Pager: [phone]

## 2020-08-17 ENCOUNTER — Encounter (HOSPITAL_COMMUNITY): Payer: Self-pay | Admitting: Cardiothoracic Surgery

## 2020-08-17 DIAGNOSIS — I6340 Cerebral infarction due to embolism of unspecified cerebral artery: Secondary | ICD-10-CM | POA: Diagnosis not present

## 2020-08-17 DIAGNOSIS — Z515 Encounter for palliative care: Secondary | ICD-10-CM | POA: Diagnosis not present

## 2020-08-17 DIAGNOSIS — Z9889 Other specified postprocedural states: Secondary | ICD-10-CM | POA: Diagnosis not present

## 2020-08-17 DIAGNOSIS — R509 Fever, unspecified: Secondary | ICD-10-CM | POA: Diagnosis not present

## 2020-08-17 DIAGNOSIS — R570 Cardiogenic shock: Secondary | ICD-10-CM | POA: Diagnosis not present

## 2020-08-17 DIAGNOSIS — N179 Acute kidney failure, unspecified: Secondary | ICD-10-CM | POA: Diagnosis not present

## 2020-08-17 DIAGNOSIS — I5043 Acute on chronic combined systolic (congestive) and diastolic (congestive) heart failure: Secondary | ICD-10-CM | POA: Diagnosis not present

## 2020-08-17 DIAGNOSIS — K810 Acute cholecystitis: Secondary | ICD-10-CM | POA: Diagnosis not present

## 2020-08-17 DIAGNOSIS — J9601 Acute respiratory failure with hypoxia: Secondary | ICD-10-CM | POA: Diagnosis not present

## 2020-08-17 LAB — BASIC METABOLIC PANEL
Anion gap: 12 (ref 5–15)
Anion gap: 12 (ref 5–15)
BUN: 91 mg/dL — ABNORMAL HIGH (ref 8–23)
BUN: 93 mg/dL — ABNORMAL HIGH (ref 8–23)
CO2: 23 mmol/L (ref 22–32)
CO2: 24 mmol/L (ref 22–32)
Calcium: 7.7 mg/dL — ABNORMAL LOW (ref 8.9–10.3)
Calcium: 7.7 mg/dL — ABNORMAL LOW (ref 8.9–10.3)
Chloride: 105 mmol/L (ref 98–111)
Chloride: 107 mmol/L (ref 98–111)
Creatinine, Ser: 3.75 mg/dL — ABNORMAL HIGH (ref 0.61–1.24)
Creatinine, Ser: 3.76 mg/dL — ABNORMAL HIGH (ref 0.61–1.24)
GFR, Estimated: 17 mL/min — ABNORMAL LOW (ref >60)
GFR, Estimated: 17 mL/min — ABNORMAL LOW (ref >60)
Glucose, Bld: 190 mg/dL — ABNORMAL HIGH (ref 70–99)
Glucose, Bld: 164 mg/dL — ABNORMAL HIGH (ref 70–99)
Potassium: 3.7 mmol/L (ref 3.5–5.1)
Potassium: 4.4 mmol/L (ref 3.5–5.1)
Sodium: 140 mmol/L (ref 135–145)
Sodium: 143 mmol/L (ref 135–145)

## 2020-08-17 LAB — CBC
HCT: 27.2 % — ABNORMAL LOW (ref 39.0–52.0)
Hemoglobin: 8.5 g/dL — ABNORMAL LOW (ref 13.0–17.0)
MCH: 29.1 pg (ref 26.0–34.0)
MCHC: 31.3 g/dL (ref 30.0–36.0)
MCV: 93.2 fL (ref 80.0–100.0)
Platelets: 270 10*3/uL (ref 150–400)
RBC: 2.92 MIL/uL — ABNORMAL LOW (ref 4.22–5.81)
RDW: 19.4 % — ABNORMAL HIGH (ref 11.5–15.5)
WBC: 8.7 10*3/uL (ref 4.0–10.5)
nRBC: 0 % (ref 0.0–0.2)

## 2020-08-17 LAB — HEPARIN LEVEL (UNFRACTIONATED)
Heparin Unfractionated: 0.13 IU/mL — ABNORMAL LOW (ref 0.30–0.70)
Heparin Unfractionated: 0.15 IU/mL — ABNORMAL LOW (ref 0.30–0.70)
Heparin Unfractionated: 0.14 IU/mL — ABNORMAL LOW (ref 0.30–0.70)

## 2020-08-17 LAB — GLUCOSE, CAPILLARY
Glucose-Capillary: 140 mg/dL — ABNORMAL HIGH (ref 70–99)
Glucose-Capillary: 172 mg/dL — ABNORMAL HIGH (ref 70–99)
Glucose-Capillary: 187 mg/dL — ABNORMAL HIGH (ref 70–99)
Glucose-Capillary: 196 mg/dL — ABNORMAL HIGH (ref 70–99)
Glucose-Capillary: 202 mg/dL — ABNORMAL HIGH (ref 70–99)
Glucose-Capillary: 207 mg/dL — ABNORMAL HIGH (ref 70–99)
Glucose-Capillary: 88 mg/dL (ref 70–99)

## 2020-08-17 LAB — COOXEMETRY PANEL
Carboxyhemoglobin: 1.5 % (ref 0.5–1.5)
Methemoglobin: 0.9 % (ref 0.0–1.5)
O2 Saturation: 64.7 %
Total hemoglobin: 8.5 g/dL — ABNORMAL LOW (ref 12.0–16.0)

## 2020-08-17 LAB — CULTURE, BLOOD (ROUTINE X 2)
Culture: NO GROWTH
Culture: NO GROWTH
Special Requests: ADEQUATE
Special Requests: ADEQUATE

## 2020-08-17 LAB — MAGNESIUM
Magnesium: 2.4 mg/dL (ref 1.7–2.4)
Magnesium: 2.3 mg/dL (ref 1.7–2.4)

## 2020-08-17 LAB — LACTATE DEHYDROGENASE: LDH: 272 U/L — ABNORMAL HIGH (ref 98–192)

## 2020-08-17 MED ORDER — DEXTROSE 10 % IV SOLN: INTRAVENOUS | Status: DC

## 2020-08-17 MED ORDER — INSULIN DETEMIR 100 UNIT/ML ~~LOC~~ SOLN
10.0000 [IU] | Freq: Two times a day (BID) | SUBCUTANEOUS | Status: DC
  Administered 2020-08-18 (×2): 10 [IU] via SUBCUTANEOUS
  Filled 2020-08-17 (×3): qty 0.1

## 2020-08-17 MED ORDER — INSULIN ASPART 100 UNIT/ML IJ SOLN: 5.0000 [IU] | INTRAMUSCULAR | Status: DC

## 2020-08-17 MED ORDER — FUROSEMIDE 10 MG/ML IJ SOLN
60.0000 mg | Freq: Two times a day (BID) | INTRAMUSCULAR | Status: AC
  Administered 2020-08-17 (×2): 60 mg via INTRAVENOUS
  Filled 2020-08-17 (×2): qty 6

## 2020-08-17 MED ORDER — POTASSIUM CHLORIDE 10 MEQ/50ML IV SOLN
10.0000 meq | INTRAVENOUS | Status: AC
  Administered 2020-08-17 (×4): 10 meq via INTRAVENOUS
  Filled 2020-08-17 (×4): qty 50

## 2020-08-17 MED ORDER — INSULIN DETEMIR 100 UNIT/ML ~~LOC~~ SOLN
20.0000 [IU] | Freq: Two times a day (BID) | SUBCUTANEOUS | Status: DC
  Filled 2020-08-17 (×2): qty 0.2

## 2020-08-17 MED ORDER — INSULIN DETEMIR 100 UNIT/ML ~~LOC~~ SOLN
10.0000 [IU] | Freq: Two times a day (BID) | SUBCUTANEOUS | Status: DC
  Filled 2020-08-17: qty 0.1

## 2020-08-17 NOTE — Progress Notes (Signed)
Admit: 07/06/2020 LOS: 58  50M progressive AKI after CABG with prolonged shock (cardiogenic and ? Vasodilatory/septic), failure to achieve target volume status despite aggressive diuretics.    Catheter: right IJ Temp IJ CCM placed 5/14  S: Minimally febrile over the past 24 hours.  Urine output remains good.  Creatinine decreasing although BUN slightly higher at 91.   O: 05/18 0701 - 05/19 0700 In: 2014 [I.V.:922.5; NG/GT:495; IV Piggyback:250.8] Out: 1935 [Urine:1310; Drains:325; TMAUQ:333]  Vitals:   08/17/20 0800 08/17/20 0900  BP: (!) 141/89 (!) 160/84  Pulse: 80 86  Resp: 17 20  Temp: 99.86 F (37.7 C) 99.86 F (37.7 C)  SpO2: 100% 100%     PE Eyes lying in bed in no distress Lungs-bilateral chest rise, ventilated CV-normal rate, sternotomy scar Abd-no pain with palpation, soft Ext-minimal pitting edema  Filed Weights   08/14/20 0600 08/16/20 0500 08/17/20 0500  Weight: 110.4 kg 114.3 kg 114.1 kg    Recent Labs  Lab 08/12/20 0252 08/12/20 0738 08/12/20 1521 08/13/20 0326 08/13/20 1526 08/16/20 0429 08/16/20 1736 08/17/20 0412  NA 137   < > 138  139  139 140   < > 140 141 140  K 3.2*   < > 3.8  3.8  3.8 3.3*   < > 3.5 4.1 3.7  CL 87*   < > 89*  90*  90* 90*   < > 103 104 105  CO2 37*   < > 34*  34*  35* 33*   < > _0 GLUCOSE 186*   < > 225*  224*  225* 125*   < > 131* 50* 190*  BUN 42*   < > 53*  52*  53* 62*   < > 83* 87* 91*  CREATININE 3.34*   < > 4.00*  3.88*  3.94* 4.33*   < > 3.98* 3.87* 3.75*  CALCIUM 8.3*   < > 8.0*  8.1*  8.1* 8.3*   < > 7.6* 7.6* 7.7*  PHOS 3.2  --  5.0* 5.0*  --   --   --   --    < > = values in this interval not displayed.   Recent Labs  Lab 08/12/20 0738 08/13/20 0306 08/13/20 1526 08/14/20 0428 08/15/20 0413 08/16/20 0429 08/17/20 0412  WBC 10.2   < > 8.6   < > 9.7 9.4 8.7  NEUTROABS 7.4  --  5.9  --   --   --   --   HGB 7.5*   < > 9.2*   < > 9.3* 9.0* 8.5*  HCT 24.4*   < > 28.9*   < > 30.0*  29.2* 27.2*  MCV 92.1   < > 89.5   < > 92.9 93.6 93.2  PLT 306   < > 303   < > 266 255 270   < > = values in this interval not displayed.    Scheduled Meds: . aspirin EC  325 mg Oral Daily   Or  . aspirin  324 mg Per Tube Daily  . chlorhexidine gluconate (MEDLINE KIT)  15 mL Mouth Rinse BID  . Chlorhexidine Gluconate Cloth  6 each Topical Daily  . darbepoetin (ARANESP) injection - NON-DIALYSIS  150 mcg Subcutaneous Q Wed-1800  . feeding supplement (PROSource TF)  45 mL Per Tube 5 X Daily  . ferrous [id number] C-folic acid  1 capsule Oral BID PC  . fiber  1 packet Per Tube BID  . furosemide  60 mg Intravenous BID  . Gerhardt's butt cream   Topical QID  . insulin aspart  3 Units Subcutaneous Q4H  . insulin aspart  3-9 Units Subcutaneous Q4H  . insulin detemir  29 Units Subcutaneous BID  . levalbuterol  0.63 mg Nebulization TID  . mouth rinse  15 mL Mouth Rinse 10 times per day  . mexiletine  200 mg Per Tube Q12H  . midodrine  10 mg Per Tube TID WC  . pantoprazole sodium  40 mg Per Tube Daily  . rosuvastatin  10 mg Per Tube Daily  . sodium chloride flush  10-40 mL Intracatheter Q12H  . sodium chloride flush  5 mL Intracatheter Q8H   Continuous Infusions: . sodium chloride 15 mL/hr at 08/16/20 1500  . sodium chloride    . sodium chloride    . sodium chloride Stopped (08/17/20 0721)  . dextrose 50 mL/hr at 08/17/20 0938  . DOBUTamine 2.5 mcg/kg/min (08/17/20 0900)  . feeding supplement (VITAL 1.5 CAL) Stopped (08/17/20 0835)  . heparin 1,150 Units/hr (08/17/20 0900)  . impella catheter heparin 50 unit/mL in dextrose 5% 10.5 mL/hr at 08/09/20 2227  . norepinephrine (LEVOPHED) Adult infusion Stopped (08/17/20 0645)  . piperacillin-tazobactam (ZOSYN)  IV 12.5 mL/hr at 08/17/20 0900  . potassium chloride 10 mEq (08/17/20 0931)   PRN Meds:.sodium chloride, Place/Maintain arterial line **AND** sodium chloride, acetaminophen (TYLENOL) oral liquid 160 mg/5 mL, fentaNYL  (SUBLIMAZE) injection, polyvinyl alcohol, sodium chloride flush  ABG    Component Value Date/Time   PHART 7.598 (H) 08/11/2020 0356   PCO2ART 52.8 (H) 08/11/2020 0356   PO2ART 127 (H) 08/11/2020 0356   HCO3 51.2 (H) 08/11/2020 0356   TCO2 43 (H) 08/11/2020 0742   ACIDBASEDEF 2.0 08/05/2020 0400   O2SAT 64.7 08/17/2020 0420    A/P  1. Dialysis dependent AKI  secondary to  cardiorenal syndrome probably some ATN s/p CABG.  Required CRRT from 5/3 to 5/13.  BL Crt <2.  Creatinine now improving however BUN is higher. Likely multifactorial but overall stable/reassuring trend. Volume status acceptable. Okay to continue holding rrt.  2. Acute systolic CHF / ICM; impella but weaning. Currently on dobut-CVP acceptable.  Management per primary.  Urine output adequate.  Could consider Lasix if needed 3. CAD s/p  5V CABG 4/26 4. Anemia-ESA on board-  Transfuse PRN-  Given one unit on 5/12 5. DM2 6. PAD hx/o Fem Pop Bypass 7. VDRF -remains ventilated 8. Numerous ischemic bilateral posterior CVAs, neurology following-- this is the major issue 9. Fevers: Extensive fevers.  Now improved.  Possibly was drug fever related to meropenem; looking into cholecystitis as another possibility. On zosyn.   agree with goals of care conversation, grim prognosis given his prolonged neurological insult.  Partial code-  But proceeding with peg and trach.   Fortunately he has not needed renal replacement therapy for several days.   Reesa Chew, MD  [facility]

## 2020-08-17 NOTE — Progress Notes (Addendum)
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 complete examination. Limited examinations for reoccurring indications may be performed as noted.  Right Carotid Findings: +----------+--------+--------+--------+------------------------------+--------+           PSV cm/sEDV cm/sStenosisDescribe                      Comments  +----------+--------+--------+--------+------------------------------+--------+ CCA Prox  53      6               smooth and heterogenous                +----------+--------+--------+--------+------------------------------+--------+ CCA Distal42      9               focal, smooth and heterogenous         +----------+--------+--------+--------+------------------------------+--------+ ICA Prox  67      29      1-39%                                          +----------+--------+--------+--------+------------------------------+--------+ ICA Distal84      33                                            tortuous +----------+--------+--------+--------+------------------------------+--------+ ECA       38                      smooth and heterogenous                +----------+--------+--------+--------+------------------------------+--------+ Portions of this table do not appear  on this page. +----------+--------+-------+----------------+------------+           PSV cm/sEDV cmsDescribe        Arm Pressure +----------+--------+-------+----------------+------------+ [id number]             Multiphasic, WNL             +----------+--------+-------+----------------+------------+ +---------+--------+--+--------+--+---------+ VertebralPSV cm/s51EDV cm/s11Antegrade +---------+--------+--+--------+--+---------+ Left Carotid Findings: +----------+--------+--------+--------+------------------------------+--------+           PSV cm/sEDV cm/sStenosisDescribe                      Comments +----------+--------+--------+--------+------------------------------+--------+ CCA Prox  79      16                                                     +----------+--------+--------+--------+------------------------------+--------+ CCA Distal69      16              focal, smooth and heterogenous          +----------+--------+--------+--------+------------------------------+--------+ ICA Prox  146     44      40-59%  heterogenous and calcific              +----------+--------+--------+--------+------------------------------+--------+ ICA Distal100     28                                                     +----------+--------+--------+--------+------------------------------+--------+ ECA       53      5               heterogenous and calcific              +----------+--------+--------+--------+------------------------------+--------+ +----------+--------+--------+----------------+------------+ SubclavianPSV cm/sEDV cm/sDescribe        Arm Pressure +----------+--------+--------+----------------+------------+           153             Multiphasic, WNL             +----------+--------+--------+----------------+------------+ +---------+--------+--+--------+--+---------+ VertebralPSV cm/s39EDV cm/s11Antegrade +---------+--------+--+--------+--+---------+  ABI Findings: +---------+------------------+-----+----------+--------+ Right    Rt Pressure (mmHg)IndexWaveform  Comment  +---------+------------------+-----+----------+--------+ Brachial 126                    triphasic          +---------+------------------+-----+----------+--------+ PTA      106               0.84 monophasic         +---------+------------------+-----+----------+--------+ DP       98                0.78 monophasic         +---------+------------------+-----+----------+--------+ Richmond Campbell               1.09                    +---------+------------------+-----+----------+--------+ +---------+------------------+-----+----------+--------------------------------+ Left     Lt Pressure (mmHg)IndexWaveform  Comment                          +---------+------------------+-----+----------+--------------------------------+ Brachial 120  triphasic                                   +---------+------------------+-----+----------+--------------------------------+ PTA      128               1.02 monophasic                                 +---------+------------------+-----+----------+--------------------------------+ DP       112               0.89 monophasic                                 +---------+------------------+-----+----------+--------------------------------+ Great Toe                                 Unable to obtain pressure due to                                           amputation                       +---------+------------------+-----+----------+--------------------------------+ +-------+---------------+----------------+ ABI/TBIToday's ABI/TBIPrevious ABI/TBI +-------+---------------+----------------+ Right  0.84/1.09      0.73/0.69        +-------+---------------+----------------+ Left   1.02/-         0.89/0.38        +-------+---------------+----------------+  Right Doppler Findings: +-----------+--------+-----+---------+-----------------------------------------+ Site       PressureIndexDoppler  Comments                                  +-----------+--------+-----+---------+-----------------------------------------+ Brachial   126          triphasic                                          +-----------+--------+-----+---------+-----------------------------------------+ Radial                  triphasic                                          +-----------+--------+-----+---------+-----------------------------------------+ Ulnar                   triphasic                                          +-----------+--------+-----+---------+-----------------------------------------+ Palmar Arch                      Signal obliterates with radial                                             compression, is unaffected  with ulnar                                      compression                                +-----------+--------+-----+---------+-----------------------------------------+  Left Doppler Findings: +-----------+--------+-----+---------+-----------------------------------------+ Site       PressureIndexDoppler  Comments                                  +-----------+--------+-----+---------+-----------------------------------------+ Brachial   120          triphasic                                          +-----------+--------+-----+---------+-----------------------------------------+ Radial                  triphasic                                          +-----------+--------+-----+---------+-----------------------------------------+ Ulnar                   triphasic                                          +-----------+--------+-----+---------+-----------------------------------------+ Palmar Arch                      Signal is unaffected with radial                                           compression, decreases 50% with ulnar                                      compression.                              +-----------+--------+-----+---------+-----------------------------------------+  Summary: Right Carotid: Velocities in the right ICA are consistent with a 1-39% stenosis. Left Carotid: Velocities in the left ICA are consistent with a 40-59% stenosis. Vertebrals:  Bilateral vertebral arteries demonstrate antegrade flow. Subclavians: Normal flow hemodynamics were seen in bilateral subclavian              arteries. Right ABI: Resting right ankle-brachial index indicates mild right lower extremity arterial disease. The right toe-brachial index is normal. Left ABI: Resting left ankle-brachial index is within normal range. No evidence of significant left lower extremity arterial disease. Right Upper Extremity: No significant arterial obstruction detected in the right upper extremity. Left Upper Extremity: No significant arterial obstruction detected  in the left upper extremity.  Electronically signed by Ruta Hinds MD on 07/24/2020 at 4:12:13 PM.    Final    VAS [place] LOWER EXTREMITY VENOUS (DVT)  Result Date: 08/16/2020  Lower Venous DVT  Study Patient Name:  GRAYTON BEAVIN  Date of Exam:   08/16/2020 Medical Rec #: [id number]      Accession #:    [id number] Date of Birth: 1950-04-14     Patient Gender: M Patient Age:   69Y Exam Location:  [facility] Procedure:      VAS [place] LOWER EXTREMITY VENOUS (DVT) Referring Phys: [id number] [place] --------------------------------------------------------------------------------  Indications: Edema.  Comparison Study: 08/11/20 previous Performing Technologist: Abram Sander RVS  Examination Guidelines: A complete evaluation includes B-mode imaging, spectral Doppler, color Doppler, and power Doppler as needed of all accessible portions of each vessel. Bilateral testing is considered an integral part of a complete examination. Limited examinations for reoccurring indications may be performed as noted. The reflux portion of the exam is performed with the patient in reverse Trendelenburg.  +---------+---------------+---------+-----------+----------+-------------------+ RIGHT    CompressibilityPhasicitySpontaneityPropertiesThrombus Aging      +---------+---------------+---------+-----------+----------+-------------------+ CFV      Full           Yes      Yes                                      +---------+---------------+---------+-----------+----------+-------------------+ SFJ      Full                                                             +---------+---------------+---------+-----------+----------+-------------------+ FV Prox  Full                                                             +---------+---------------+---------+-----------+----------+-------------------+ FV Mid   Full                                                              +---------+---------------+---------+-----------+----------+-------------------+ FV DistalFull                                                             +---------+---------------+---------+-----------+----------+-------------------+ PFV      Full                                                             +---------+---------------+---------+-----------+----------+-------------------+ POP      Full           Yes      Yes                                      +---------+---------------+---------+-----------+----------+-------------------+  PTV      Full                                                             +---------+---------------+---------+-----------+----------+-------------------+ PERO                                                  Not well visualized +---------+---------------+---------+-----------+----------+-------------------+   +---------+---------------+---------+-----------+----------+-------------------+ LEFT     CompressibilityPhasicitySpontaneityPropertiesThrombus Aging      +---------+---------------+---------+-----------+----------+-------------------+ CFV      Full           Yes      Yes                                      +---------+---------------+---------+-----------+----------+-------------------+ SFJ      Full                                                             +---------+---------------+---------+-----------+----------+-------------------+ FV Prox  Full                                                             +---------+---------------+---------+-----------+----------+-------------------+ FV Mid   Full                                                             +---------+---------------+---------+-----------+----------+-------------------+ FV DistalFull                                                             +---------+---------------+---------+-----------+----------+-------------------+  PFV      Full                                                             +---------+---------------+---------+-----------+----------+-------------------+ POP      Full           Yes      Yes                                      +---------+---------------+---------+-----------+----------+-------------------+ PTV      Full                                                             +---------+---------------+---------+-----------+----------+-------------------+  PERO                                                  Not well visualized +---------+---------------+---------+-----------+----------+-------------------+     Summary: BILATERAL: - No evidence of deep vein thrombosis seen in the lower extremities, bilaterally. -No evidence of popliteal cyst, bilaterally.   *See table(s) above for measurements and observations. Electronically signed by Curt Jews MD on 08/16/2020 at 8:37:02 PM.    Final    VAS [place] LOWER EXTREMITY VENOUS (DVT)  Result Date: 08/11/2020  Lower Venous DVT Study Patient Name:  [facility]  Date of Exam:   08/11/2020 Medical Rec #: [id number]      Accession #:    [id number] Date of Birth: February 29, 1951     Patient Gender: M Patient Age:   69Y Exam Location:  [facility] Procedure:      VAS [place] LOWER EXTREMITY VENOUS (DVT) Referring Phys: [id number] [place] --------------------------------------------------------------------------------  Indications: Edema. Other Indications: Status post CABG 4/26 with right leg vein harvested. Limitations: Poor ultrasound/tissue interface. Comparison Study: No prior Performing Technologist: Oda Cogan RDMS, RVT  Examination Guidelines: A complete evaluation includes B-mode imaging, spectral Doppler, color Doppler, and power Doppler as needed of all accessible portions of each vessel. Bilateral testing is considered an integral part of a complete examination. Limited examinations for reoccurring indications may be performed  as noted. The reflux portion of the exam is performed with the patient in reverse Trendelenburg.  +---------+---------------+---------+-----------+----------+--------------+ RIGHT    CompressibilityPhasicitySpontaneityPropertiesThrombus Aging +---------+---------------+---------+-----------+----------+--------------+ CFV      Full           Yes      Yes                                 +---------+---------------+---------+-----------+----------+--------------+ SFJ      Full                                                        +---------+---------------+---------+-----------+----------+--------------+ FV Prox  Full                                                        +---------+---------------+---------+-----------+----------+--------------+ FV Mid   Full                                                        +---------+---------------+---------+-----------+----------+--------------+ FV DistalFull                                                        +---------+---------------+---------+-----------+----------+--------------+ PFV      Full                                                        +---------+---------------+---------+-----------+----------+--------------+  POP      Full           Yes      Yes                                 +---------+---------------+---------+-----------+----------+--------------+ PTV      Full                                                        +---------+---------------+---------+-----------+----------+--------------+ PERO     Full                                                        +---------+---------------+---------+-----------+----------+--------------+ Hematomas are noted along the incision site in the thigh area.  +---------+---------------+---------+-----------+----------+--------------+ LEFT     CompressibilityPhasicitySpontaneityPropertiesThrombus Aging  +---------+---------------+---------+-----------+----------+--------------+ CFV      Full           Yes      Yes                                 +---------+---------------+---------+-----------+----------+--------------+ SFJ      Full                                                        +---------+---------------+---------+-----------+----------+--------------+ FV Prox  Full                                                        +---------+---------------+---------+-----------+----------+--------------+ FV Mid   Full                                                        +---------+---------------+---------+-----------+----------+--------------+ FV DistalFull                                                        +---------+---------------+---------+-----------+----------+--------------+ PFV      Full                                                        +---------+---------------+---------+-----------+----------+--------------+ POP      Full           Yes      Yes                                 +---------+---------------+---------+-----------+----------+--------------+  PTV      Full                                                        +---------+---------------+---------+-----------+----------+--------------+ PERO     Full                                                        +---------+---------------+---------+-----------+----------+--------------+     Summary: BILATERAL: - No evidence of deep vein thrombosis seen in the lower extremities, bilaterally. -No evidence of popliteal cyst, bilaterally.   *See table(s) above for measurements and observations. Electronically signed by Servando Snare MD on 08/11/2020 at 4:56:30 PM.    Final    ECHOCARDIOGRAM LIMITED  Result Date: 08/15/2020    ECHOCARDIOGRAM LIMITED REPORT   Patient Name:   [facility] Date of Exam: 08/15/2020 Medical Rec #:  [id number]     Height:       73.0 in Accession #:     [id number]    Weight:       243.4 lb Date of Birth:  03-Mar-1951    BSA:          2.339 m Patient Age:    54 years      BP:           130/67 mmHg Patient Gender: M             HR:           80 bpm. Exam Location:  Inpatient Procedure: Limited Echo Indications:    CHF-Acute Systolic [id number]  History:        Patient has prior history of Echocardiogram examinations, most                 recent 08/10/2020. Signs/Symptoms:Fever. Cardiogenic shock:                 Ischemic cardiomyopathy, post-CABG on 4/26. Impella 5.5 down to                 P4. Thrombocytopenia, Anemia, AAA, Acute kidney injury.  Sonographer:    Darlina Sicilian RDCS Referring Phys: [place]  Conclusion(s)/Recommendation(s): LIMITED ECHO for Impella cannula placement. Left Ventricle: Left ventricular ejection fraction, by estimation, is <20%. The left ventricle has severely decreased function. Compared to prior, Similar cannular placement (5.2 cm from the annulus). Rudean Haskell MD Electronically signed by Rudean Haskell MD Signature Date/Time: 08/15/2020/11:01:39 AM    Final    ECHOCARDIOGRAM LIMITED  Result Date: 08/07/2020    ECHOCARDIOGRAM LIMITED REPORT   Patient Name:   [facility] Date of Exam: 08/07/2020 Medical Rec #:  [id number]     Height:       73.0 in Accession #:    [id number]    Weight:       259.5 lb Date of Birth:  1950/06/01    BSA:          2.403 m Patient Age:    28 years      BP:           138/63 mmHg Patient Gender: M  HR:           74 bpm. Exam Location:  Inpatient Procedure: Limited Echo Indications:    Impella positioning  History:        Patient has prior history of Echocardiogram examinations, most                 recent 08/03/2020. CHF and Cardiomyopathy, CAD, Prior CABG, PAD;                 Risk Factors:Hypertension. Cardiogenic shock.  Sonographer:    Dustin Flock Referring Phys: [id number] [place] ECHO for Impella cannula placement. Sanda Klein MD Electronically signed  by Sanda Klein MD Signature Date/Time: 08/09/2020/9:06:09 AM    Final    ECHOCARDIOGRAM LIMITED  Result Date: 08/03/2020    ECHOCARDIOGRAM LIMITED REPORT   Patient Name:   [facility] [place] Date of Exam: 08/03/2020 Medical Rec #:  [id number]     Height:       73.0 in Accession #:    [id number]    Weight:       276.2 lb Date of Birth:  11/23/1950    BSA:          2.468 m Patient Age:    79 years      BP:           118/79 mmHg Patient Gender: M             HR:           73 bpm. Exam Location:  Inpatient Procedure: Limited Echo and Color Doppler Indications:    I50.23 Acute on chronic systolic (congestive) heart failure  History:        Patient has prior history of Echocardiogram examinations, most                 recent 08/02/2020. COPD, Stroke and PAD; Risk                 Factors:Hypertension, Diabetes, Dyslipidemia and Sleep Apnea.                 Seizures. AAA. GERD.  Sonographer:    Jonelle Sidle Dance Referring Phys: [place]  1. Left ventricular ejection fraction, by estimation, is <20%. The left ventricle has severely decreased function. Limited echo for Impella positioning. Compared to prior study support device has been pulled out of the left ventircle. Measured at 4.75 cm from the annulus. FINDINGS  Left Ventricle: Left ventricular ejection fraction, by estimation, is <20%. The left ventricle has severely decreased function. Rudean Haskell MD Electronically signed by Rudean Haskell MD Signature Date/Time: 08/03/2020/5:58:54 PM    Final    ECHOCARDIOGRAM LIMITED  Result Date: 08/02/2020    ECHOCARDIOGRAM LIMITED REPORT   Patient Name:   [facility] Date of Exam: 08/02/2020 Medical Rec #:  [id number]     Height:       73.0 in Accession #:    [id number]    Weight:       282.0 lb Date of Birth:  [birth year]    BSA:          2.490 m Patient Age:    20 years      BP:           141/89 mmHg Patient Gender: M             HR:           92 bpm. Exam Location:  Inpatient Procedure: Limited  Echo Indications:    Impella positioning  History:        Patient has prior history of Echocardiogram examinations, most                 recent 07/31/2020. CHF and Cardiomyopathy, CAD, Prior CABG, PAD,                 Arrythmias:Atrial Fibrillation; Risk Factors:Diabetes.  Sonographer:    Dustin Flock Referring Phys: [place]  1. Limited echo for Impella positioning EF 20% no effusion Normal RV size Impella seems to be positioned too deep as far as 7 cm distal to aortic annulus with turbulance from outflow near aortic annulus. FINDINGS  Additional Comments: Limited echo for Impella positioning EF 20% no effusion Normal RV size Impella seems to be positioned too deep as far as 7 cm distal to aortic annulus with turbulance from outflow near aortic annulus. Jenkins Rouge MD Electronically signed by Jenkins Rouge MD Signature Date/Time: 08/02/2020/10:52:34 AM    Final    ECHOCARDIOGRAM LIMITED  Result Date: 07/31/2020    ECHOCARDIOGRAM LIMITED REPORT   Patient Name:   [facility] [place] Date of Exam: 07/31/2020 Medical Rec #:  [id number]     Height:       73.0 in Accession #:    [id number]    Weight:       280.9 lb Date of Birth:  Feb 10, 1951    BSA:          2.486 m Patient Age:    46 years      BP:           113/81 mmHg Patient Gender: M             HR:           89 bpm. Exam Location:  Inpatient Procedure: Limited Echo Indications:    [id number] Acute systolic (congestive) heart failure  History:        Patient has prior history of Echocardiogram examinations, most                 recent 07/29/2020. Prior CABG; Risk Factors:Hypertension,                 Diabetes and Dyslipidemia.  Sonographer:    Merrie Roof RDCS Referring Phys: Laie Comments: This was a limited echo for Impella position. The Impella device was not moved. IMPRESSIONS  1. Left ventricular ejection fraction, by estimation, is <20%. The left ventricle has severely decreased function with dyskinetic septum. The  left ventricular internal cavity size was mildly dilated. Impella 5.5 catheter present with tip at 5.3 cm from the aortic valve.  2. Right ventricular systolic function is moderately reduced. The right ventricular size is normal. FINDINGS  Left Ventricle: Left ventricular ejection fraction, by estimation, is <20%. The left ventricle has severely decreased function. The left ventricular internal cavity size was mildly dilated. There is no left ventricular hypertrophy. Right Ventricle: The right ventricular size is normal. Right ventricular systolic function is moderately reduced. Loralie Champagne MD Electronically signed by Loralie Champagne MD Signature Date/Time: 07/31/2020/1:52:44 PM    Final    ECHOCARDIOGRAM LIMITED  Result Date: 07/29/2020    ECHOCARDIOGRAM LIMITED REPORT   Patient Name:   [facility] Date of Exam: 07/29/2020 Medical Rec #:  [id number]     Height:       73.0 in Accession #:    [id number]    Weight:  272.5 lb Date of Birth:  07-25-1950    BSA:          2.454 m Patient Age:    50 years      BP:           98/75 mmHg Patient Gender: M             HR:           66 bpm. Exam Location:  Inpatient Procedure: Limited Echo                                STAT ECHO  Dr. Loralie Champagne present at bedside for Impella placement measurements. Indications:    CHF-Acute Systolic [id number]                 Impella placement  History:        Patient has prior history of Echocardiogram examinations, most                 recent 07/28/2028. CHF, CAD, Prior CABG; Risk                 Factors:Hypertension, Diabetes and Dyslipidemia. Stroke. Cardiac                 shock. Impella device.  Sonographer:    Clayton Lefort RDCS (AE) Referring Phys: [id number] Elby Showers [facility]  Sonographer Comments: Technically difficult study due to poor echo windows, patient is morbidly obese and echo performed with patient supine and on artificial respirator. Image acquisition challenging due to patient body habitus. IMPRESSIONS  1. Impella placement - 2  images. Initial depth of 5.11 cm past the aortic valve appears adequate. FINDINGS  Left Ventricle: Impella placement - 2 images. Initial depth of 5.11 cm past the aortic valve appears adequate. Lyman Bishop MD Electronically signed by Lyman Bishop MD Signature Date/Time: 07/29/2020/11:55:05 AM    Final    ECHOCARDIOGRAM LIMITED  Result Date: 07/28/2020    ECHOCARDIOGRAM LIMITED REPORT   Patient Name:   [facility] Date of Exam: 07/28/2020 Medical Rec #:  [id number]     Height:       73.0 in Accession #:    [id number]    Weight:       272.5 lb Date of Birth:  [birth year]    BSA:          2.454 m Patient Age:    62 years      BP:           90/56 mmHg Patient Gender: M             HR:           86 bpm. Exam Location:  Inpatient Procedure: Limited Echo and Color Doppler Indications:    I50.40* Unspecified combined systolic (congestive) and diastolic                 (congestive) heart failure.Limited echo for Impella placemant.  History:        Patient has prior history of Echocardiogram examinations, most                 recent 07/26/2020. CHF, CAD, Prior CABG, Stroke; Risk                 Factors:Hypertension, Diabetes and Dyslipidemia. Cardiac shock.                 Impella device.  Sonographer:  Roseanna Rainbow RDCS Referring Phys: [id number] AMY D CLEGG  Sonographer Comments: Technically difficult study due to poor echo windows and echo performed with patient supine and on artificial respirator. Image acquisition challenging due to patient body habitus. IMPRESSIONS  1. Impella cathteter noted across the aortic valve and in the left ventricle. Initially 5.9 cm distal to the aortic valve annulus. The catheter was withdrawn to 4.8 cm. Left ventricular ejection fraction, by estimation, is <20%. The left ventricle has severely decreased function. The left ventricle demonstrates global hypokinesis.  2. Right ventricular systolic function is severely reduced. The right ventricular size is normal.  3. The mitral valve is  grossly normal.  4. The aortic valve was not well visualized. FINDINGS  Left Ventricle: Impella cathteter noted across the aortic valve and in the left ventricle. Initially 5.9 cm distal to the aortic valve annulus. The catheter was withdrawn to 4.8 cm. Left ventricular ejection fraction, by estimation, is <20%. The left ventricle has severely decreased function. The left ventricle demonstrates global hypokinesis. The left ventricular internal cavity size was normal in size. Right Ventricle: The right ventricular size is normal. No increase in right ventricular wall thickness. Right ventricular systolic function is severely reduced. Left Atrium: Left atrial size was not assessed. Right Atrium: Right atrial size was not assessed. Pericardium: There is no evidence of pericardial effusion. Mitral Valve: The mitral valve is grossly normal. Aortic Valve: The aortic valve was not well visualized. Skeet Latch MD Electronically signed by Skeet Latch MD Signature Date/Time: 07/28/2020/11:58:19 AM    Final    ECHOCARDIOGRAM LIMITED  Result Date: 07/26/2020    ECHOCARDIOGRAM LIMITED REPORT   Patient Name:   [facility] Date of Exam: 07/26/2020 Medical Rec #:  [id number]     Height:       73.0 in Accession #:    [id number]    Weight:       280.0 lb Date of Birth:  Jul 21, 1950    BSA:          2.482 m Patient Age:    75 years      BP:           99/76 mmHg Patient Gender: M             HR:           92 bpm. Exam Location:  Inpatient Procedure: Limited Echo Indications:    I50.23 Acute on chronic systolic (congestive) heart failure  History:        Patient has prior history of Echocardiogram examinations, most                 recent 07/06/2020. COPD, Stroke and PAD; Risk                 Factors:Hypertension, Diabetes, Dyslipidemia, Sleep Apnea and                 GERD. Abdominal Aortic Aneurysm.  Sonographer:    Tiffany Dance Referring Phys: [phone] AMY D CLEGG IMPRESSIONS  1. Limited echo to evaluate impella  positioning. Impella measures 4.9 cm from aortic valve  2. Left ventricular ejection fraction, by estimation, is <20%. The left ventricle has severely decreased function.  3. Right ventricular systolic function is severely reduced. The right ventricular size is normal. FINDINGS  Left Ventricle: Left ventricular ejection fraction, by estimation, is <20%. The left ventricle has severely decreased function. Right Ventricle: The right ventricular size is normal. Right ventricular systolic function is severely reduced. Harrell Gave  Gardiner Rhyme MD Electronically signed by Oswaldo Milian MD Signature Date/Time: 07/26/2020/10:48:24 AM    Final    [place] EKG SITE RITE  Result Date: 08/15/2020 If Site Rite image not attached, placement could not be confirmed due to current cardiac rhythm.  [place] EKG SITE RITE  Result Date: 08/02/2020 If Site Rite image not attached, placement could not be confirmed due to current cardiac rhythm.  CT CHEST ABDOMEN PELVIS WO CONTRAST  Result Date: 08/15/2020 CLINICAL DATA:  Sepsis, recent CABG EXAM: CT CHEST, ABDOMEN AND PELVIS WITHOUT CONTRAST TECHNIQUE: Multidetector CT imaging of the chest, abdomen and pelvis was performed following the standard protocol without IV contrast. COMPARISON:  09/28/2018 FINDINGS: CT CHEST FINDINGS Cardiovascular: Aortic atherosclerosis. Cardiomegaly status post median sternotomy and CABG. Extensive 3 vessel coronary artery calcifications and/or stents. Right subclavian approach Impella device position within the left ventricle. Right internal jugular and left subclavian venous catheters. No pericardial effusion. Mediastinum/Nodes: No enlarged mediastinal, hilar, or axillary lymph nodes. Tracheostomy. Thyroid and esophagus demonstrate no significant findings. Lungs/Pleura: Small, right greater than left bilateral pleural effusions and associated atelectasis or consolidation. Musculoskeletal: No chest wall mass or suspicious bone lesions identified. Status  post median sternotomy and CABG with postoperative retrosternal stranding and fluid (series 3, image 26). There is no discrete fluid collection identified. Superficial metallic pellets in the left anterior chest wall. CT ABDOMEN PELVIS FINDINGS Hepatobiliary: No solid liver abnormality is seen. The gallbladder is mildly distended with some suggestion of wall thickening and dependent calcified sludge. No discrete gallstones identified. No biliary ductal dilatation. Pancreas: Unremarkable. No pancreatic ductal dilatation or surrounding inflammatory changes. Spleen: Normal in size without significant abnormality. Adrenals/Urinary Tract: Adrenal glands are unremarkable. Small nonobstructive calculus of the superior pole of the right kidney. No hydronephrosis. Bladder is decompressed by a Foley catheter. Thickening of the urinary bladder wall. Stomach/Bowel: Stomach is within normal limits. Enteric feeding tube with tip in the duodenal bulb. Appendix is not clearly visualized. No evidence of bowel wall thickening, distention, or inflammatory changes. Rectal tube. Vascular/Lymphatic: Aortic atherosclerosis. Redemonstrated calcified aneurysm of the infrarenal abdominal aorta measuring 4.5 x 4.3 cm (series 3, image 80). No enlarged abdominal or pelvic lymph nodes. Reproductive: No mass or other abnormality. Other: No abdominal wall hernia or abnormality. Anasarca. Trace ascites throughout the abdomen and pelvis. Musculoskeletal: No acute or significant osseous findings. There is a small hematoma within the left psoas muscle body, measuring approximately 5.5 x 3.6 x 3.1 cm (series 3, image 88, series 6, image 122). IMPRESSION: 1. Status post median sternotomy and CABG with postoperative retrosternal stranding and fluid. There is no discrete fluid collection or other evidence of postoperative complication identified. 2. The gallbladder is mildly distended with some suggestion of wall thickening and dependent calcified sludge.  No discrete gallstones identified. If there is clinical concern for acute cholecystitis, right upper quadrant ultrasound or HIDA may be useful to further evaluate. 3. There is a small hematoma within the left psoas muscle body, measuring approximately 5.5 x 3.6 x 3.1 cm. 4. Small, right greater than left bilateral pleural effusions and associated atelectasis or consolidation. 5. Trace ascites and anasarca. 6. Extensive support apparatus as detailed above including tracheostomy and Impella device. 7. Redemonstrated calcified aneurysm of the infrarenal abdominal aorta measuring 4.5 x 4.3 cm. Recommend follow-up CT/MR every 6 months and vascular consultation when clinically appropriate. This recommendation follows ACR consensus guidelines: White Paper of the ACR Incidental Findings Committee II on Vascular Findings. J Am Coll Radiol 2013; 10:789-794. Aortic Atherosclerosis (  ICD10-I70.0). Electronically Signed   By: Eddie Candle M.D.   On: 08/15/2020 11:39   US Abdomen Limited RUQ (LIVER/GB)  Result Date: 08/16/2020 CLINICAL DATA:  Fever EXAM: ULTRASOUND ABDOMEN LIMITED RIGHT UPPER QUADRANT COMPARISON:  CT Aug 15, 2020 FINDINGS: Gallbladder: Sludge in a dilated gallbladder with wall thickening measuring 3.5 mm and trace pericholecystic fluid. No cholelithiasis visualized. Common bile duct: Diameter: 3 mm Liver: No focal lesion identified. Within normal limits in parenchymal echogenicity. Portal vein is patent on color Doppler imaging with normal direction of blood flow towards the liver. Other: None. IMPRESSION: 1. Sludge in a dilated gallbladder with wall thickening and trace pericholecystic fluid. No cholelithiasis visualized. Findings are suspicious for acute acalculous cholecystitis. Electronically Signed   By: Dahlia Bailiff MD   On: 08/16/2020 03:35    PHYSICAL EXAM  Temp:  [98.1 F (36.7 C)-100.4 F (38 C)] 100.04 F (37.8 C) (05/19 1200) Pulse Rate:  [70-96] 77 (05/19 1200) Resp:  [7-23] 21 (05/19  1200) BP: (113-163)/(61-101) 135/65 (05/19 1200) SpO2:  [99 %-100 %] 100 % (05/19 1200) Arterial Line BP: (113-160)/(47-71) 129/47 (05/19 1200) FiO2 (%):  [40 %] 40 % (05/19 1107) Weight:  [114.1 kg] 114.1 kg (05/19 0500)  General - Well nourished, well developed, lethargic on trach.  Ophthalmologic - fundi not visualized due to noncooperation.  Cardiovascular - Regular rhythm and rate with frequent PACs on tele.  Neuro - lethargic but eyes open, right eye had difficulty with closure with subjunctiva edema, on NS gauze. Not following commands, nonverbal. eyes left gaze preference position, tracking to the voice on the left, but blinking to visual threat bilaterally, corneal reflex present bilaterally. Right mild facial droop, right eye difficulty closure, enlarged palpebral fissure. Tongue protrusion not cooperative. with stimulation, he was able to have flexion of bicep on the left against gravity, RUE no spontaneous movement but able to withdraw to pain but not against gravity, no movement on the b/l lower extremities. Left anterior foot amputated. Sensation, coordination not cooperative and gait not tested.   ASSESSMENT/PLAN 70 year old male with history of diabetes, hyperlipidemia, hypertension, diabetes, PVD, CAD/MI, CHF, OSA and stroke admitted on 07/24/2020 for severe cardiomyopathy with EF less than 20% and severe CAD status post CABG, put on LVAD.  Postop with multiple complications including respiratory failure, renal failure, heart failure and encephalopathy.  Had trach and PEG on 5/10.  CT head on 5/6 and 5/10 showed multifocal ischemic infarcts.  Not able to perform MRI.  EEG showed severe encephalopathy, no seizure. Repeat CT on 5/17 again showed stable known infarcts, no new intracranial pathology. He had fever and subsequently found to have acute cholecystitis s/p surgery.   Comparing with his neuro exam on 5/14, his mental status had some improvement although still nonverbal and  not following commands but more reactive than before. CT no new findings, no new infarct and no evidence of anoxic brain injury so far. Although MRI would be better to evaluate further, however, he has LVAD and cardiac wires, not candidate for MRI soon. Not able to perform CTA head and neck due to renal failure. But 07/24/20 carotid doppler showed left ICA 40-59% stenosis, right ICA unremarkable.   Etiology for patient embolic strokes likely due to severe cardiomyopathy, recent cardio procedure and an LVAD.  Currently on heparin IV and aspirin. Per bedside RN and notes, family is hopeful and pt condition is treatable. Also with neuro improvement, agree with aggressive care which seems reasonable. Will recommend to continue anticoagulation and  aspirin for stroke prevention.    Neurology will sign off. Please call with questions. Pt will follow up with stroke clinic NP at [facility] in about 4 weeks after discharge. Thanks for the consult.  Rosalin Hawking, MD PhD Stroke Neurology 08/17/2020 1:03 PM

## 2020-08-17 NOTE — Progress Notes (Addendum)
[place] for Heparin Indication: Impella 5.5  No Known Allergies  Patient Measurements: Height: '6\' 1"'$  (185.4 cm) Weight: 114.1 kg (251 lb 8.7 oz) IBW/kg (Calculated) : 79.9 Heparin Dosing Weight: 102.8 kg  Vital Signs: Temp: 99.9 F (37.7 C) (05/19 0600) Temp Source: Bladder (05/19 0400) BP: 121/65 (05/19 0600) Pulse Rate: 75 (05/19 0600)  Labs: Recent Labs    08/15/20 0413 08/15/20 1632 08/16/20 0429 08/16/20 1327 08/16/20 1736 08/17/20 0018 08/17/20 0412  HGB 9.3*  --  9.0*  --   --   --  8.5*  HCT 30.0*  --  29.2*  --   --   --  27.2*  PLT 266  --  255  --   --   --  270  LABPROT  --   --   --  15.4*  --   --   --   INR  --   --   --  1.2  --   --   --   HEPARINUNFRC 0.39   < > 0.37  --  <0.10* 0.13* 0.15*  CREATININE 4.89*  --  3.98*  --  3.87*  --  3.75*   < > = values in this interval not displayed.    Estimated Creatinine Clearance: 25 mL/min (A) (by C-G formula based on SCr of 3.75 mg/dL (H)).   Assessment: 70 yo M presents with NSTEMI and multivessel CAD, now s/p CABG with Impella support. Pharmacy asked to manage systemic heparin. Heparin was held following IR guided drain placement yesterday evening. Heparin was restarted at [place].   Heparin level 0.15 (subtherapeutic)  CBC low but stable  Goal of Therapy:  Heparin level 0.2-0.3 units/ml Monitor platelets by anticoagulation protocol: Yes   Plan:  Contine heparin purge solution (50 units/ml) Increase heparin infusion slightly to 1150 units / hr q12h labs at 5a/5p F/u surgical plans  Wilson Singer, PharmD PGY1 Pharmacy Resident 08/17/2020 6:44 AM

## 2020-08-17 NOTE — Progress Notes (Signed)
Patient ID: Danny Carpenter, male   DOB: 1951/03/23, 70 y.o.   MRN: [id number]     Advanced Heart Failure Rounding Note  PCP-Cardiologist: None   Subjective:    - 4/26 S/P CABG - 5/4 Swan removed with ongoing fevers, CVVH started - CT head on 5/6 with multiple posterior circulation infarcts.  - 5/10 tracheostomy. CT head similar to prior with multiple small infarcts.  - 5/14 Torsades -> paced out by Dr. Orvan Seen. Impella turned back up from P3 -> P7  after code. Amio gtt switched to lidocaine.   - 5/16 Eraxis added for persistent fevers. Cultures resent>>pending  - 5/17 CT chest/abdomen/pelvis and abdominal US suggestive of acalculous cholecystitis - 5/18 cholecystostomy tube  Continues on mexiletine, had NSVT yesterday in IR but now stable.  In AF in 70s.    Impella now at P-3. Remains on DBA 2.5. Off NE.  Co-ox 65%. Remains on heparin gtt.   Has been off CRRT since 5/13 for HD holiday. Made about 1.3 L of urine yesterday. CVP not hooked up currently.   SCr trending down 4.89>>3.98>>3.75 but BUN rising to 91.    mTemp overnight 100.4. WBC nl.  Now off Eraxis and on Zosyn for biliary coverage.  Has cholecystostomy tube in place.   Eyes open, does not follow commands for me.   Impella P3 Flow 1.9-2 Waveforms ok LDH 290>>468>>386 >>272 Position of Impella stable on 5/16  Objective:   Weight Range: 114.1 kg Body mass index is 33.19 kg/m.   Vital Signs:   Temp:  [98.1 F (36.7 C)-100.4 F (38 C)] 99.9 F (37.7 C) (05/19 0700) Pulse Rate:  [70-96] 76 (05/19 0700) Resp:  [7-24] 19 (05/19 0700) BP: (113-163)/(60-101) 127/63 (05/19 0700) SpO2:  [99 %-100 %] 100 % (05/19 0700) Arterial Line BP: (113-160)/(47-71) 118/67 (05/19 0700) FiO2 (%):  [40 %] 40 % (05/19 0211) Weight:  [114.1 kg] 114.1 kg (05/19 0500) Last BM Date: 08/16/20  Weight change: Filed Weights   08/14/20 0600 08/16/20 0500 08/17/20 0500  Weight: 110.4 kg 114.3 kg 114.1 kg     Intake/Output:   Intake/Output Summary (Last 24 hours) at 08/17/2020 0745 Last data filed at 08/17/2020 0700 Gross per 24 hour  Intake 2013.97 ml  Output 1935 ml  Net 78.97 ml      Physical Exam   General: NAD Neck: JVP 10 cm, no thyromegaly or thyroid nodule. Trach.  Lungs: Decreased at bases.  CV: Sternotomy.  Heart irregular S1/S2, no S3/S4, no murmur.  1+ edema to knees.   Abdomen: Soft, nontender, no hepatosplenomegaly, no distention.  Skin: Intact without lesions or rashes.  Neurologic: Eyes open, does not follow commands.  Extremities: No clubbing or cyanosis.  HEENT: Normal.    Telemetry    AF 70s Personally reviewed  Labs    CBC Recent Labs    08/16/20 0429 08/17/20 0412  WBC 9.4 8.7  HGB 9.0* 8.5*  HCT 29.2* 27.2*  MCV 93.6 93.2  PLT 255 233   Basic Metabolic Panel Recent Labs    08/16/20 1736 08/17/20 0412  NA 141 140  K 4.1 3.7  CL 104 105  CO2 24 23  GLUCOSE 50* 190*  BUN 87* 91*  CREATININE 3.87* 3.75*  CALCIUM 7.6* 7.7*  MG 2.3 2.4   Liver Function Tests Recent Labs    08/16/20 0429  AST 303*  ALT 105*  ALKPHOS 517*  BILITOT 1.8*  PROT 5.8*  ALBUMIN 1.8*   No results for input(s): LIPASE,  AMYLASE in the last 72 hours. Cardiac Enzymes No results for input(s): CKTOTAL, CKMB, CKMBINDEX, TROPONINI in the last 72 hours.  BNP: BNP (last 3 results) Recent Labs    07/03/2020 1211  BNP 465.5*    ProBNP (last 3 results) No results for input(s): PROBNP in the last 8760 hours.   D-Dimer No results for input(s): DDIMER in the last 72 hours. Hemoglobin A1C No results for input(s): HGBA1C in the last 72 hours. Fasting Lipid Panel No results for input(s): CHOL, HDL, LDLCALC, TRIG, CHOLHDL, LDLDIRECT in the last 72 hours. Thyroid Function Tests No results for input(s): TSH, T4TOTAL, T3FREE, THYROIDAB in the last 72 hours.  Invalid input(s): FREET3  Other results:   Imaging    IR Perc Cholecystostomy  Result Date:  08/16/2020 INDICATION: 70 year old male with sepsis and equivocal imaging findings of acute acalculous cholecystitis. EXAM: Percutaneous cholecystostomy tube placement MEDICATIONS: The patient is receiving systemic antibiotics as an inpatient which require no additional prophylactic coverage. ANESTHESIA/SEDATION: The patient was monitored by the ICU nurse throughout the procedure, no additional sedation was required. FLUOROSCOPY TIME:  Fluoroscopy Time: 1 minutes 18 seconds (15 mGy). COMPLICATIONS: None immediate. PROCEDURE: Informed written consent was obtained from the patient after a thorough discussion of the procedural risks, benefits and alternatives. All questions were addressed. Maximal Sterile Barrier Technique was utilized including caps, mask, sterile gowns, sterile gloves, sterile drape, hand hygiene and skin antiseptic. A timeout was performed prior to the initiation of the procedure. The patient was placed supine on the angiographic table. The patient's right upper quadrant was then prepped and draped in normal sterile fashion with maximum sterile barrier. Ultrasound demonstrates a distended gallbladder. Subdermal Local anesthesia was provided at the planned skin entry site. Under ultrasound guidance, deeper local anesthetic was provided through intercostal muscles and along the liver capsule. Ultrasound was used to puncture the gallbladder using a 21 gauge Chiba needle via a transhepatic approach with visualization of the lung treated to the gallbladder. A 0.018 inch wire was advanced into the lumen and a transition dilator placed. A gentle hand injection of contrast was performed. Cholecystogram demonstrates amorphous filling of the gallbladder lumen. No filling defects suggestive of gallstones. The cystic duct is obstructed. A 0.035 inch exchange wire was placed in the tract was dilated. A 10.2 French multipurpose drainage catheter was advanced into the gallbladder lumen. The drain was then secured  in place using a 0-silk suture and a Stayfix device. A sterile dressing was applied. The tube was placed to bag drainage. A culture was sent to the lab for analysis. The patient tolerated procedure well without evidence of immediate complication was transferred back to the floor in stable condition. IMPRESSION: Successful placement of percutaneous, transhepatic cholecystostomy tube. Ruthann Cancer, MD [facility] Radiology Electronically Signed   By: Ruthann Cancer MD   On: 08/16/2020 17:00   DG Chest Port 1 View  Result Date: 08/16/2020 CLINICAL DATA:  Status post PICC line placement. EXAM: PORTABLE CHEST 1 VIEW COMPARISON:  Aug 15, 2020 FINDINGS: There is stable tracheostomy tube, nasogastric tube, right jugular venous catheter, left subclavian venous catheter and Impella device positioning. Multiple sternal wires are noted. Mild linear atelectasis is seen within the bilateral lung bases. There is no evidence of a pleural effusion or pneumothorax. The cardiac silhouette is enlarged and unchanged in size. Multiple left-sided rib fractures are again seen. IMPRESSION: No significant interval change when compared to the prior study, dated Aug 15, 2020. Electronically Signed  By: Virgina Norfolk M.D.   On: 08/16/2020 19:14   VAS [place] LOWER EXTREMITY VENOUS (DVT)  Result Date: 08/16/2020  Lower Venous DVT Study Patient Name:  [facility] [place]  Date of Exam:   08/16/2020 Medical Rec #: [id number]      Accession #:    [id number] Date of Birth: July 04, 1950     Patient Gender: M Patient Age:   69Y Exam Location:  [facility] Procedure:      VAS [place] LOWER EXTREMITY VENOUS (DVT) Referring Phys: [id number] [place] --------------------------------------------------------------------------------  Indications: Edema.  Comparison Study: 08/11/20 previous Performing Technologist: Abram Sander RVS  Examination Guidelines: A complete evaluation includes B-mode imaging,  spectral Doppler, color Doppler, and power Doppler as needed of all accessible portions of each vessel. Bilateral testing is considered an integral part of a complete examination. Limited examinations for reoccurring indications may be performed as noted. The reflux portion of the exam is performed with the patient in reverse Trendelenburg.  +---------+---------------+---------+-----------+----------+-------------------+ RIGHT    CompressibilityPhasicitySpontaneityPropertiesThrombus Aging      +---------+---------------+---------+-----------+----------+-------------------+ CFV      Full           Yes      Yes                                      +---------+---------------+---------+-----------+----------+-------------------+ SFJ      Full                                                             +---------+---------------+---------+-----------+----------+-------------------+ FV Prox  Full                                                             +---------+---------------+---------+-----------+----------+-------------------+ FV Mid   Full                                                             +---------+---------------+---------+-----------+----------+-------------------+ FV DistalFull                                                             +---------+---------------+---------+-----------+----------+-------------------+ PFV      Full                                                             +---------+---------------+---------+-----------+----------+-------------------+ POP      Full           Yes      Yes                                      +---------+---------------+---------+-----------+----------+-------------------+  PTV      Full                                                             +---------+---------------+---------+-----------+----------+-------------------+ PERO                                                  Not  well visualized +---------+---------------+---------+-----------+----------+-------------------+   +---------+---------------+---------+-----------+----------+-------------------+ LEFT     CompressibilityPhasicitySpontaneityPropertiesThrombus Aging      +---------+---------------+---------+-----------+----------+-------------------+ CFV      Full           Yes      Yes                                      +---------+---------------+---------+-----------+----------+-------------------+ SFJ      Full                                                             +---------+---------------+---------+-----------+----------+-------------------+ FV Prox  Full                                                             +---------+---------------+---------+-----------+----------+-------------------+ FV Mid   Full                                                             +---------+---------------+---------+-----------+----------+-------------------+ FV DistalFull                                                             +---------+---------------+---------+-----------+----------+-------------------+ PFV      Full                                                             +---------+---------------+---------+-----------+----------+-------------------+ POP      Full           Yes      Yes                                      +---------+---------------+---------+-----------+----------+-------------------+ PTV      Full                                                             +---------+---------------+---------+-----------+----------+-------------------+  PERO                                                  Not well visualized +---------+---------------+---------+-----------+----------+-------------------+     Summary: BILATERAL: - No evidence of Danny vein thrombosis seen in the lower extremities, bilaterally. -No evidence of popliteal cyst,  bilaterally.   *See table(s) above for measurements and observations. Electronically signed by Curt Jews MD on 08/16/2020 at 8:37:02 PM.    Final      Medications:     Scheduled Medications: . aspirin EC  325 mg Oral Daily   Or  . aspirin  324 mg Per Tube Daily  . chlorhexidine gluconate (MEDLINE KIT)  15 mL Mouth Rinse BID  . Chlorhexidine Gluconate Cloth  6 each Topical Daily  . darbepoetin (ARANESP) injection - NON-DIALYSIS  150 mcg Subcutaneous Q Wed-1800  . feeding supplement (PROSource TF)  45 mL Per Tube 5 X Daily  . ferrous [id number] C-folic acid  1 capsule Oral BID PC  . fiber  1 packet Per Tube BID  . Gerhardt's butt cream   Topical QID  . insulin aspart  3 Units Subcutaneous Q4H  . insulin aspart  3-9 Units Subcutaneous Q4H  . insulin detemir  29 Units Subcutaneous BID  . levalbuterol  0.63 mg Nebulization TID  . mouth rinse  15 mL Mouth Rinse 10 times per day  . mexiletine  200 mg Per Tube Q12H  . midodrine  10 mg Per Tube TID WC  . pantoprazole sodium  40 mg Per Tube Daily  . rosuvastatin  10 mg Per Tube Daily  . sodium chloride flush  10-40 mL Intracatheter Q12H  . sodium chloride flush  5 mL Intracatheter Q8H    Infusions: . sodium chloride 15 mL/hr at 08/16/20 1500  . sodium chloride    . sodium chloride    . sodium chloride 10 mL/hr at 08/17/20 0700  . DOBUTamine 2.5 mcg/kg/min (08/17/20 0700)  . feeding supplement (VITAL 1.5 CAL) 55 mL/hr at 08/17/20 0400  . fentaNYL infusion INTRAVENOUS 200 mcg/hr (08/17/20 0700)  . heparin 1,150 Units/hr (08/17/20 0700)  . impella catheter heparin 50 unit/mL in dextrose 5% 10.5 mL/hr at 08/09/20 2227  . norepinephrine (LEVOPHED) Adult infusion Stopped (08/17/20 0645)  . piperacillin-tazobactam (ZOSYN)  IV 3.375 g (08/17/20 0721)    PRN Medications: sodium chloride, Place/Maintain arterial line **AND** sodium chloride, acetaminophen (TYLENOL) oral liquid 160 mg/5 mL, fentaNYL (SUBLIMAZE) injection, polyvinyl  alcohol, sodium chloride flush  Assessment/Plan   1. Cardiogenic shock: Ischemic cardiomyopathy, post-CABG on 4/26.  He has Impella 5.5 in place, down to P4 with no alarms and stable LDH.  Limited echo 4/29 with EF< 20%, the RV appears normal in size with severe systolic dysfunction. Now off NE and on dobutamine 2.5. Echo with some improvement, EF in 30-35% range on 5/9 echo. Suspect component of septic/distributive shock. Impella down to P-3. On DBA 2.5. Co-ox 65%.  Weight stable but less UOP yesterday, CVP not set up.   - Continue dobutamine 2.5.  - Impella ready for removal, hopefully today. LDH lower.  - Lasix 60 mg IV bid today.  2. CAD: s/p CABG x 5 with LIMA-LAD, seq SVG-D1/ramus, seq SVG-PDA/PLV.   - ASA  - Crestor 3. Anemia: Post-op bleeding, back to OR with multiple products given on 4/26 post-CABG.  Hgb  8.5.  - Transfuse < 7.5    4. Thrombocytopenia: Resolved. suspect low post-op/post-surgical bleeding and multiple blood products as well as sepsis.  5. PAD: Extensive history.  6. AAA: Monitoring as outpatient.  7. Type 2 DM: Insulin.  Hgb A1c was 11.6, poor control.  8. Atrial fibrillation: Rate controlled.   - Off amio with torsades and possible QT prolongation 9. Neuro: intubated and sedated, not following commands with sedation wean.  Head CT 5/6 with multiple posterior circulation infarcts. Head CT 5/10 no change.  Awake. Per nursing staff, has intermittently followed commands but not for me today. NH3 was not elevated.  - Neuro has seen.  - Palliative Care following.  10. ID:  PCT 7.5 on 4/29. Cultures so far negative. Tm 100.4.  Now with cholecystostomy drain for acalculous cholecystitis. Off Eraxis now and on Zosyn for biliary coverage. ID following.  11. Acute hypoxemic respiratory failure: S/p tracheostomy on 5/10. Has had PNA.  - trach management per PCCM  12. AKI:  CVVH started with marked volume overload and worsening renal function.  CVVH off currently, still making  urine but slowing.  Creatinine down but BUN up.  - Lasix 60 IV bid today.  - Concerned that he may require HD again eventually.  13. Ileus: Improved 14. Torsades: Paced out by Dr Orvan Seen on 5/14. Amiodarone stopped.  - continue Mexiletine  15. GOC: was limited code and now changed back to Full Code 16. Acute acalculous cholecystitis: S/p biliary drain placement.   - Zosyn for biliary coverage.  17. AAA: 4.5 cm on CT.   CRITICAL CARE Performed by: Loralie Champagne  Total critical care time: 40 minutes  Critical care time was exclusive of separately billable procedures and treating other patients.  Critical care was necessary to treat or prevent imminent or life-threatening deterioration.  Critical care was time spent personally by me on the following activities: development of treatment plan with patient and/or surrogate as well as nursing, discussions with consultants, evaluation of patient's response to treatment, examination of patient, obtaining history from patient or surrogate, ordering and performing treatments and interventions, ordering and review of laboratory studies, ordering and review of radiographic studies, pulse oximetry and re-evaluation of patient's condition.  Loralie Champagne 08/17/2020 7:45 AM

## 2020-08-17 NOTE — Progress Notes (Signed)
[place] [facility]  Date of Admission:  07/27/2020           Reason for visit: Follow up on fevers  Current antibiotics: Piperacillin tazobactam 5/18--present  Previous antibiotics: Cefepime 4/26 >> 5/05 Meropenem 5/10 >5/16 Vancomycin 4/26 >>4/30 Eraxis 5/16 >5/18  ASSESSMENT:    Fevers: Multiple potential etiologies for fever with concern for acalculous cholecystitis status post IR cholecystostomy tube drain placement 08/16/2020.  Cultures no growth to date, however, was on antibiotics around the time of this procedure.  Also had his subclavian CVC line removed 5/18. Elevated LFTs: In the setting of a calculus cholecystitis. AKI: Requiring CRRT earlier this admission but currently off dialysis at this time.  Temporary dialysis catheter remains in place. Cardiogenic shock: Hopeful for Impella removal hopefully today or tomorrow. Type 2 diabetes Multiple posterior circulation infarcts noted on CT: Neurology following peripherally and recommending MRI when LVAD removed  PLAN:    Continue piperacillin tazobactam per pharmacy dosed for current renal function Follow-up cholecystostomy tube cultures and drain output.  325 cc recorded in the last 24 hours. Monitor fever curve, LFTs Will follow   Principal Problem:   Acute acalculous cholecystitis Active Problems:   Acute on chronic heart failure (HCC)   Non-ST elevation (NSTEMI) myocardial infarction [facility])   Essential hypertension   Hx of CABG   Cardiogenic shock (HCC)   Coronary artery disease involving coronary bypass graft of native heart with unstable angina pectoris (HCC)   Pleural effusion on right   AKI (acute kidney injury) ([place])   Cerebral embolism with cerebral infarction   Pressure injury of skin    MEDICATIONS:    Scheduled Meds: . aspirin EC  325 mg Oral Daily   Or  . aspirin  324 mg Per Tube Daily  . chlorhexidine gluconate (MEDLINE KIT)  15 mL Mouth Rinse BID  .  Chlorhexidine Gluconate Cloth  6 each Topical Daily  . darbepoetin (ARANESP) injection - NON-DIALYSIS  150 mcg Subcutaneous Q Wed-1800  . feeding supplement (PROSource TF)  45 mL Per Tube 5 X Daily  . ferrous [id number] C-folic acid  1 capsule Oral BID PC  . fiber  1 packet Per Tube BID  . furosemide  60 mg Intravenous BID  . Gerhardt's butt cream   Topical QID  . insulin aspart  3-9 Units Subcutaneous Q4H  . insulin detemir  20 Units Subcutaneous BID  . levalbuterol  0.63 mg Nebulization TID  . mouth rinse  15 mL Mouth Rinse 10 times per day  . mexiletine  200 mg Per Tube Q12H  . midodrine  10 mg Per Tube TID WC  . pantoprazole sodium  40 mg Per Tube Daily  . rosuvastatin  10 mg Per Tube Daily  . sodium chloride flush  10-40 mL Intracatheter Q12H  . sodium chloride flush  5 mL Intracatheter Q8H   Continuous Infusions: . sodium chloride 15 mL/hr at 08/16/20 1500  . sodium chloride    . sodium chloride    . sodium chloride Stopped (08/17/20 0721)  . dextrose 50 mL/hr at 08/17/20 1100  . DOBUTamine 2.5 mcg/kg/min (08/17/20 1100)  . feeding supplement (VITAL 1.5 CAL) 1,000 mL (08/17/20 1117)  . heparin 1,150 Units/hr (08/17/20 1100)  . impella catheter heparin 50 unit/mL in dextrose 5% 10.5 mL/hr at 08/09/20 2227  . norepinephrine (LEVOPHED) Adult infusion Stopped (08/17/20 0645)  . piperacillin-tazobactam (ZOSYN)  IV 12.5 mL/hr at 08/17/20 1100  . potassium chloride 10  mEq (08/17/20 1139)   PRN Meds:.sodium chloride, Place/Maintain arterial line **AND** sodium chloride, acetaminophen (TYLENOL) oral liquid 160 mg/5 mL, fentaNYL (SUBLIMAZE) injection, polyvinyl alcohol, sodium chloride flush  SUBJECTIVE:   24 hour events:  Status post cholecystostomy tube placement Fever curve seems to be improving WBC normalized   Patient remains intubated and sedated  Review of Systems  Unable to perform ROS: Intubated      OBJECTIVE:   Blood pressure (!) 162/69, pulse 87,  temperature 100.04 F (37.8 C), resp. rate (!) 22, height 6' 1"  (1.854 m), weight 114.1 kg, SpO2 100 %. Body mass index is 33.19 kg/m.  Physical Exam Constitutional:      Comments: Intubated, sedated  HENT:     Head: Normocephalic and atraumatic.  Neck:     Comments: Tracheostomy in place Pulmonary:     Comments: Ventilated breath sounds diminished at the bases Abdominal:     Comments: Cholecystostomy tube in place with dark drainage fluid  Musculoskeletal:     Comments: Status post left TMA  Skin:    General: Skin is warm and dry.     Findings: No rash.  Neurological:     Comments: Sedated      Lab Results: Lab Results  Component Value Date   WBC 8.7 08/17/2020   HGB 8.5 (L) 08/17/2020   HCT 27.2 (L) 08/17/2020   MCV 93.2 08/17/2020   PLT 270 08/17/2020    Lab Results  Component Value Date   NA 140 08/17/2020   K 3.7 08/17/2020   CO2 23 08/17/2020   GLUCOSE 190 (H) 08/17/2020   BUN 91 (H) 08/17/2020   CREATININE 3.75 (H) 08/17/2020   CALCIUM 7.7 (L) 08/17/2020   GFRNONAA 17 (L) 08/17/2020   GFRAA >60 08/08/2019    Lab Results  Component Value Date   ALT 105 (H) 08/16/2020   AST 303 (H) 08/16/2020   ALKPHOS 517 (H) 08/16/2020   BILITOT 1.8 (H) 08/16/2020    No results found for: CRP  No results found for: ESRSEDRATE   I have reviewed the micro and lab results in Epic.  Imaging: IR Perc Cholecystostomy  Result Date: 08/16/2020 INDICATION: 70 year old male with sepsis and equivocal imaging findings of acute acalculous cholecystitis. EXAM: Percutaneous cholecystostomy tube placement MEDICATIONS: The patient is receiving systemic antibiotics as an inpatient which require no additional prophylactic coverage. ANESTHESIA/SEDATION: The patient was monitored by the ICU nurse throughout the procedure, no additional sedation was required. FLUOROSCOPY TIME:  Fluoroscopy Time: 1 minutes 18 seconds (15 mGy). COMPLICATIONS: None immediate. PROCEDURE: Informed written  consent was obtained from the patient after a thorough discussion of the procedural risks, benefits and alternatives. All questions were addressed. Maximal Sterile Barrier Technique was utilized including caps, mask, sterile gowns, sterile gloves, sterile drape, hand hygiene and skin antiseptic. A timeout was performed prior to the initiation of the procedure. The patient was placed supine on the angiographic table. The patient's right upper quadrant was then prepped and draped in normal sterile fashion with maximum sterile barrier. Ultrasound demonstrates a distended gallbladder. Subdermal Local anesthesia was provided at the planned skin entry site. Under ultrasound guidance, deeper local anesthetic was provided through intercostal muscles and along the liver capsule. Ultrasound was used to puncture the gallbladder using a 21 gauge Chiba needle via a transhepatic approach with visualization of the lung treated to the gallbladder. A 0.018 inch wire was advanced into the lumen and a transition dilator placed. A gentle hand injection of contrast was performed. Cholecystogram  demonstrates amorphous filling of the gallbladder lumen. No filling defects suggestive of gallstones. The cystic duct is obstructed. A 0.035 inch exchange wire was placed in the tract was dilated. A 10.2 French multipurpose drainage catheter was advanced into the gallbladder lumen. The drain was then secured in place using a 0-silk suture and a Stayfix device. A sterile dressing was applied. The tube was placed to bag drainage. A culture was sent to the lab for analysis. The patient tolerated procedure well without evidence of immediate complication was transferred back to the floor in stable condition. IMPRESSION: Successful placement of percutaneous, transhepatic cholecystostomy tube. Ruthann Cancer, MD [facility] Electronically Signed   By: Ruthann Cancer MD   On: 08/16/2020 17:00   DG  Chest Port 1 View  Result Date: 08/16/2020 CLINICAL DATA:  Status post PICC line placement. EXAM: PORTABLE CHEST 1 VIEW COMPARISON:  Aug 15, 2020 FINDINGS: There is stable tracheostomy tube, nasogastric tube, right jugular venous catheter, left subclavian venous catheter and Impella device positioning. Multiple sternal wires are noted. Mild linear atelectasis is seen within the bilateral lung bases. There is no evidence of a pleural effusion or pneumothorax. The cardiac silhouette is enlarged and unchanged in size. Multiple left-sided rib fractures are again seen. IMPRESSION: No significant interval change when compared to the prior study, dated Aug 15, 2020. Electronically Signed   By: Virgina Norfolk M.D.   On: 08/16/2020 19:14   VAS [place] LOWER EXTREMITY VENOUS (DVT)  Result Date: 08/16/2020  Lower Venous DVT Study Patient Name:  [facility]  Date of Exam:   08/16/2020 Medical Rec #: [id number]      Accession #:    [id number] Date of Birth: [birth year]     Patient Gender: M Patient Age:   69Y Exam Location:  [facility] Procedure:      VAS [place] LOWER EXTREMITY VENOUS (DVT) Referring Phys: [id number] [place] --------------------------------------------------------------------------------  Indications: Edema.  Comparison Study: 08/11/20 previous Performing Technologist: Abram Sander RVS  Examination Guidelines: A complete evaluation includes B-mode imaging, spectral Doppler, color Doppler, and power Doppler as needed of all accessible portions of each vessel. Bilateral testing is considered an integral part of a complete examination. Limited examinations for reoccurring indications may be performed as noted. The reflux portion of the exam is performed with the patient in reverse Trendelenburg.  +---------+---------------+---------+-----------+----------+-------------------+ RIGHT    CompressibilityPhasicitySpontaneityPropertiesThrombus Aging       +---------+---------------+---------+-----------+----------+-------------------+ CFV      Full           Yes      Yes                                      +---------+---------------+---------+-----------+----------+-------------------+ SFJ      Full                                                             +---------+---------------+---------+-----------+----------+-------------------+ FV Prox  Full                                                             +---------+---------------+---------+-----------+----------+-------------------+  FV Mid   Full                                                             +---------+---------------+---------+-----------+----------+-------------------+ FV DistalFull                                                             +---------+---------------+---------+-----------+----------+-------------------+ PFV      Full                                                             +---------+---------------+---------+-----------+----------+-------------------+ POP      Full           Yes      Yes                                      +---------+---------------+---------+-----------+----------+-------------------+ PTV      Full                                                             +---------+---------------+---------+-----------+----------+-------------------+ PERO                                                  Not well visualized +---------+---------------+---------+-----------+----------+-------------------+   +---------+---------------+---------+-----------+----------+-------------------+ LEFT     CompressibilityPhasicitySpontaneityPropertiesThrombus Aging      +---------+---------------+---------+-----------+----------+-------------------+ CFV      Full           Yes      Yes                                      +---------+---------------+---------+-----------+----------+-------------------+  SFJ      Full                                                             +---------+---------------+---------+-----------+----------+-------------------+ FV Prox  Full                                                             +---------+---------------+---------+-----------+----------+-------------------+ FV Mid   Full                                                             +---------+---------------+---------+-----------+----------+-------------------+  FV DistalFull                                                             +---------+---------------+---------+-----------+----------+-------------------+ PFV      Full                                                             +---------+---------------+---------+-----------+----------+-------------------+ POP      Full           Yes      Yes                                      +---------+---------------+---------+-----------+----------+-------------------+ PTV      Full                                                             +---------+---------------+---------+-----------+----------+-------------------+ PERO                                                  Not well visualized +---------+---------------+---------+-----------+----------+-------------------+     Summary: BILATERAL: - No evidence of deep vein thrombosis seen in the lower extremities, bilaterally. -No evidence of popliteal cyst, bilaterally.   *See table(s) above for measurements and observations. Electronically signed by Curt Jews MD on 08/16/2020 at 8:37:02 PM.    Final    [place] EKG SITE RITE  Result Date: 08/15/2020 If Site Rite image not attached, placement could not be confirmed due to current cardiac rhythm.  US Abdomen Limited RUQ (LIVER/GB)  Result Date: 08/16/2020 CLINICAL DATA:  Fever EXAM: ULTRASOUND ABDOMEN LIMITED RIGHT UPPER QUADRANT COMPARISON:  CT Aug 15, 2020 FINDINGS: Gallbladder: Sludge in a dilated gallbladder  with wall thickening measuring 3.5 mm and trace pericholecystic fluid. No cholelithiasis visualized. Common bile duct: Diameter: 3 mm Liver: No focal lesion identified. Within normal limits in parenchymal echogenicity. Portal vein is patent on color Doppler imaging with normal direction of blood flow towards the liver. Other: None. IMPRESSION: 1. Sludge in a dilated gallbladder with wall thickening and trace pericholecystic fluid. No cholelithiasis visualized. Findings are suspicious for acute acalculous cholecystitis. Electronically Signed   By: Dahlia Bailiff MD   On: 08/16/2020 03:35     Imaging independently reviewed in Epic.    [facility] [phone] pager 08/17/2020, 11:57 AM

## 2020-08-17 NOTE — Progress Notes (Signed)
Referring Physician(s): Clark,L  Supervising Physician: Corrie Mckusick  Patient Status:  [name] - In-pt  Chief Complaint:  cholecystitis  Subjective: Pt awake, sl more responsive today; latest temp 100   Allergies: Patient has no known allergies.  Medications: Prior to Admission medications   Medication Sig Start Date End Date Taking? Authorizing Provider  amLODipine (NORVASC) 10 MG tablet Take 1 tablet (10 mg total) by mouth daily. 01/21/19  Yes Masoudi, Elhamalsadat, MD  aspirin EC 81 MG tablet Take 81 mg by mouth daily. Swallow whole.   Yes [provider]  clopidogrel (PLAVIX) 75 MG tablet Take 75 mg by mouth daily.   Yes [provider]  DULoxetine (CYMBALTA) 30 MG capsule Take 30 mg by mouth daily.   Yes [provider]  gabapentin (NEURONTIN) 600 MG tablet Take 600 mg by mouth 3 (three) times daily.   Yes [provider]  Insulin Degludec (TRESIBA) 100 UNIT/ML SOLN Inject 62 Units into the skin in the morning. Every morning to control blood sugar   Yes [provider]  levocetirizine (XYZAL) 5 MG tablet Take 5 mg by mouth at bedtime.   Yes [provider]  lisinopril (ZESTRIL) 40 MG tablet Take 40 mg by mouth daily.   Yes [provider]  metFORMIN (GLUCOPHAGE) 1000 MG tablet Take 1,000 mg by mouth 2 (two) times daily with a meal.   Yes [provider]  nitroGLYCERIN (NITROSTAT) 0.4 MG SL tablet Place 0.4 mg under the tongue every 5 (five) minutes as needed for chest pain.   Yes [provider]  pantoprazole (PROTONIX) 40 MG tablet Take 40 mg by mouth daily before breakfast.   Yes [provider]  rosuvastatin (CRESTOR) 20 MG tablet Take 20 mg by mouth daily.   Yes [provider]  Vitamin D, Ergocalciferol, (DRISDOL) 1.25 MG (50000 UNIT) CAPS capsule Take 50,000 Units by mouth 2 (two) times a week.   Yes [provider]     Vital Signs: BP (!) 154/90   Pulse 81    Temp 99.32 F (37.4 C)   Resp 19   Ht '6\' 1"'$  (1.854 m)   Wt 251 lb 8.7 oz (114.1 kg)   SpO2 100%   BMI 33.19 kg/m  Insertion site ok Physical Exam GB drain intact, insertion site ok, OP 325 cc dark bile  Imaging: CT HEAD WO CONTRAST  Result Date: 08/15/2020 CLINICAL DATA:  Delirium, acute encephalopathy, CABG, multiple infarcts EXAM: CT HEAD WITHOUT CONTRAST TECHNIQUE: Contiguous axial images were obtained from the base of the skull through the vertex without intravenous contrast. COMPARISON:  08/28/2020 FINDINGS: Brain: No evidence of new infarction, hemorrhage, hydrocephalus, extra-axial collection or mass lesion/mass effect. Hypodensity of the right cerebellar pedicle is again noted (series 7, image 10); other previously seen parenchymal hypodensities less well appreciated. Vascular: No hyperdense vessel or unexpected calcification. Skull: Normal. Negative for fracture or focal lesion. Sinuses/Orbits: No acute finding. Other: None. IMPRESSION: 1. No acute intracranial pathology. 2. Hypodensity of the right cerebellar pedicle is again noted; other previously seen parenchymal hypodensities less well appreciated. Findings are generally in keeping with expected evolution of small subacute infarctions. No evidence of hemorrhage or other complication. MRI is more sensitive for the assessment of acutely superimposed diffusion restricting infarction if clinically suspected. Electronically Signed   By: Eddie Candle M.D.   On: 08/15/2020 11:22   IR Perc Cholecystostomy  Result Date: 08/16/2020 INDICATION: 70 year old male with sepsis and equivocal imaging findings of acute  acalculous cholecystitis. EXAM: Percutaneous cholecystostomy tube placement MEDICATIONS: The patient is receiving systemic antibiotics as an inpatient which require no additional prophylactic coverage. ANESTHESIA/SEDATION: The patient was monitored by the ICU nurse throughout the procedure, no additional sedation was required.  FLUOROSCOPY TIME:  Fluoroscopy Time: 1 minutes 18 seconds (15 mGy). COMPLICATIONS: None immediate. PROCEDURE: Informed written consent was obtained from the patient after a thorough discussion of the procedural risks, benefits and alternatives. All questions were addressed. Maximal Sterile Barrier Technique was utilized including caps, mask, sterile gowns, sterile gloves, sterile drape, hand hygiene and skin antiseptic. A timeout was performed prior to the initiation of the procedure. The patient was placed supine on the angiographic table. The patient's right upper quadrant was then prepped and draped in normal sterile fashion with maximum sterile barrier. Ultrasound demonstrates a distended gallbladder. Subdermal Local anesthesia was provided at the planned skin entry site. Under ultrasound guidance, deeper local anesthetic was provided through intercostal muscles and along the liver capsule. Ultrasound was used to puncture the gallbladder using a 21 gauge Chiba needle via a transhepatic approach with visualization of the lung treated to the gallbladder. A 0.018 inch wire was advanced into the lumen and a transition dilator placed. A gentle hand injection of contrast was performed. Cholecystogram demonstrates amorphous filling of the gallbladder lumen. No filling defects suggestive of gallstones. The cystic duct is obstructed. A 0.035 inch exchange wire was placed in the tract was dilated. A 10.2 French multipurpose drainage catheter was advanced into the gallbladder lumen. The drain was then secured in place using a 0-silk suture and a Stayfix device. A sterile dressing was applied. The tube was placed to bag drainage. A culture was sent to the lab for analysis. The patient tolerated procedure well without evidence of immediate complication was transferred back to the floor in stable condition. IMPRESSION: Successful placement of percutaneous, transhepatic cholecystostomy tube. Ruthann Cancer, MD [facility] Radiology Electronically Signed   By: Ruthann Cancer MD   On: 08/16/2020 17:00   DG Chest Port 1 View  Result Date: 08/16/2020 CLINICAL DATA:  Status post PICC line placement. EXAM: PORTABLE CHEST 1 VIEW COMPARISON:  Aug 15, 2020 FINDINGS: There is stable tracheostomy tube, nasogastric tube, right jugular venous catheter, left subclavian venous catheter and Impella device positioning. Multiple sternal wires are noted. Mild linear atelectasis is seen within the bilateral lung bases. There is no evidence of a pleural effusion or pneumothorax. The cardiac silhouette is enlarged and unchanged in size. Multiple left-sided rib fractures are again seen. IMPRESSION: No significant interval change when compared to the prior study, dated Aug 15, 2020. Electronically Signed   By: Virgina Norfolk M.D.   On: 08/16/2020 19:14   DG CHEST PORT 1 VIEW  Result Date: 08/15/2020 CLINICAL DATA:  Shortness of breath EXAM: PORTABLE CHEST 1 VIEW COMPARISON:  Aug 14, 2020. FINDINGS: Tracheostomy catheter tip is 5.8 cm above the carina. Enteric tube tip below stomach. Impella device present, unchanged in position. Right jugular catheter tip in superior vena cava. Port-A-Cath tip in superior vena cava. No pneumothorax. There is mild bibasilar atelectasis. No edema or consolidation. Heart is upper normal in size with pulmonary vascularity normal. There is aortic atherosclerosis. No adenopathy no bone lesions. IMPRESSION: Tube and catheter positions as described pneumothorax. Mild bibasilar atelectasis. Stable cardiac silhouette. Electronically Signed   By: Lowella Grip III M.D.   On: 08/15/2020 08:01   DG Chest Port 1 View  Result Date: 08/14/2020 CLINICAL  DATA:  70 year old male status post recent open heart surgery. EXAM: PORTABLE CHEST - 1 VIEW COMPARISON:  08/12/2020 FINDINGS: Stable cardiomediastinal silhouette. Right IJ central line in place with the tip in the right  innominate vein. Unchanged position of Impella device. Left subclavian central line in place with the tip near the innominate confluence/superior vena cava. Enteric feeding tube terminates off the inferior aspect of this image. Tracheostomy remains in place. Defibrillator pads in place. Similar appearing patchy opacities in the right lower lobe. No new pulmonary opacities, significant pleural effusion, or evidence of pneumothorax. Sternotomy wires in place. Multifocal rounded metallic densities project over the left upper abdomen, similar to comparison. IMPRESSION: Similar appearing subsegmental atelectasis in the right lower lobe versus asymmetric mild pulmonary edema. Stable support lines and tubes. Electronically Signed   By: Ruthann Cancer MD   On: 08/14/2020 08:36   VAS [place] LOWER EXTREMITY VENOUS (DVT)  Result Date: 08/16/2020  Lower Venous DVT Study Patient Name:  [facility]  Date of Exam:   08/16/2020 Medical Rec #: [id number]      Accession #:    [id number] Date of Birth: [birth year]     Patient Gender: M Patient Age:   69Y Exam Location:  [facility] Procedure:      VAS [place] LOWER EXTREMITY VENOUS (DVT) Referring Phys: [id number] [place] --------------------------------------------------------------------------------  Indications: Edema.  Comparison Study: 08/11/20 previous Performing Technologist: Abram Sander RVS  Examination Guidelines: A complete evaluation includes B-mode imaging, spectral Doppler, color Doppler, and power Doppler as needed of all accessible portions of each vessel. Bilateral testing is considered an integral part of a complete examination. Limited examinations for reoccurring indications may be performed as noted. The reflux portion of the exam is performed with the patient in reverse Trendelenburg.  +---------+---------------+---------+-----------+----------+-------------------+ RIGHT    CompressibilityPhasicitySpontaneityPropertiesThrombus Aging       +---------+---------------+---------+-----------+----------+-------------------+ CFV      Full           Yes      Yes                                      +---------+---------------+---------+-----------+----------+-------------------+ SFJ      Full                                                             +---------+---------------+---------+-----------+----------+-------------------+ FV Prox  Full                                                             +---------+---------------+---------+-----------+----------+-------------------+ FV Mid   Full                                                             +---------+---------------+---------+-----------+----------+-------------------+ FV DistalFull                                                             +---------+---------------+---------+-----------+----------+-------------------+  PFV      Full                                                             +---------+---------------+---------+-----------+----------+-------------------+ POP      Full           Yes      Yes                                      +---------+---------------+---------+-----------+----------+-------------------+ PTV      Full                                                             +---------+---------------+---------+-----------+----------+-------------------+ PERO                                                  Not well visualized +---------+---------------+---------+-----------+----------+-------------------+   +---------+---------------+---------+-----------+----------+-------------------+ LEFT     CompressibilityPhasicitySpontaneityPropertiesThrombus Aging      +---------+---------------+---------+-----------+----------+-------------------+ CFV      Full           Yes      Yes                                      +---------+---------------+---------+-----------+----------+-------------------+  SFJ      Full                                                             +---------+---------------+---------+-----------+----------+-------------------+ FV Prox  Full                                                             +---------+---------------+---------+-----------+----------+-------------------+ FV Mid   Full                                                             +---------+---------------+---------+-----------+----------+-------------------+ FV DistalFull                                                             +---------+---------------+---------+-----------+----------+-------------------+ PFV      Full                                                             +---------+---------------+---------+-----------+----------+-------------------+  POP      Full           Yes      Yes                                      +---------+---------------+---------+-----------+----------+-------------------+ PTV      Full                                                             +---------+---------------+---------+-----------+----------+-------------------+ PERO                                                  Not well visualized +---------+---------------+---------+-----------+----------+-------------------+     Summary: BILATERAL: - No evidence of deep vein thrombosis seen in the lower extremities, bilaterally. -No evidence of popliteal cyst, bilaterally.   *See table(s) above for measurements and observations. Electronically signed by Curt Jews MD on 08/16/2020 at 8:37:02 PM.    Final    ECHOCARDIOGRAM LIMITED  Result Date: 08/15/2020    ECHOCARDIOGRAM LIMITED REPORT   Patient Name:   [facility] Padmanabhan Date of Exam: 08/15/2020 Medical Rec #:  [id number]     Height:       73.0 in Accession #:    [id number]    Weight:       243.4 lb Date of Birth:  11-Jan-1951    BSA:          2.339 m Patient Age:    82 years      BP:           130/67 mmHg Patient  Gender: M             HR:           80 bpm. Exam Location:  Inpatient Procedure: Limited Echo Indications:    CHF-Acute Systolic [id number]  History:        Patient has prior history of Echocardiogram examinations, most                 recent 08/10/2020. Signs/Symptoms:Fever. Cardiogenic shock:                 Ischemic cardiomyopathy, post-CABG on 4/26. Impella 5.5 down to                 P4. Thrombocytopenia, Anemia, AAA, Acute kidney injury.  Sonographer:    Darlina Sicilian RDCS Referring Phys: Parker  Conclusion(s)/Recommendation(s): LIMITED ECHO for Impella cannula placement. Left Ventricle: Left ventricular ejection fraction, by estimation, is <20%. The left ventricle has severely decreased function. Compared to prior, Similar cannular placement (5.2 cm from the annulus). Rudean Haskell MD Electronically signed by Rudean Haskell MD Signature Date/Time: 08/15/2020/11:01:39 AM    Final    [place] EKG SITE RITE  Result Date: 08/15/2020 If Site Rite image not attached, placement could not be confirmed due to current cardiac rhythm.  CT CHEST ABDOMEN PELVIS WO CONTRAST  Result Date: 08/15/2020 CLINICAL DATA:  Sepsis, recent CABG EXAM: CT CHEST, ABDOMEN AND PELVIS WITHOUT CONTRAST TECHNIQUE: Multidetector CT imaging of the chest, abdomen and pelvis was  performed following the standard protocol without IV contrast. COMPARISON:  09/28/2018 FINDINGS: CT CHEST FINDINGS Cardiovascular: Aortic atherosclerosis. Cardiomegaly status post median sternotomy and CABG. Extensive 3 vessel coronary artery calcifications and/or stents. Right subclavian approach Impella device position within the left ventricle. Right internal jugular and left subclavian venous catheters. No pericardial effusion. Mediastinum/Nodes: No enlarged mediastinal, hilar, or axillary lymph nodes. Tracheostomy. Thyroid and esophagus demonstrate no significant findings. Lungs/Pleura: Small, right greater than left bilateral pleural  effusions and associated atelectasis or consolidation. Musculoskeletal: No chest wall mass or suspicious bone lesions identified. Status post median sternotomy and CABG with postoperative retrosternal stranding and fluid (series 3, image 26). There is no discrete fluid collection identified. Superficial metallic pellets in the left anterior chest wall. CT ABDOMEN PELVIS FINDINGS Hepatobiliary: No solid liver abnormality is seen. The gallbladder is mildly distended with some suggestion of wall thickening and dependent calcified sludge. No discrete gallstones identified. No biliary ductal dilatation. Pancreas: Unremarkable. No pancreatic ductal dilatation or surrounding inflammatory changes. Spleen: Normal in size without significant abnormality. Adrenals/Urinary Tract: Adrenal glands are unremarkable. Small nonobstructive calculus of the superior pole of the right kidney. No hydronephrosis. Bladder is decompressed by a Foley catheter. Thickening of the urinary bladder wall. Stomach/Bowel: Stomach is within normal limits. Enteric feeding tube with tip in the duodenal bulb. Appendix is not clearly visualized. No evidence of bowel wall thickening, distention, or inflammatory changes. Rectal tube. Vascular/Lymphatic: Aortic atherosclerosis. Redemonstrated calcified aneurysm of the infrarenal abdominal aorta measuring 4.5 x 4.3 cm (series 3, image 80). No enlarged abdominal or pelvic lymph nodes. Reproductive: No mass or other abnormality. Other: No abdominal wall hernia or abnormality. Anasarca. Trace ascites throughout the abdomen and pelvis. Musculoskeletal: No acute or significant osseous findings. There is a small hematoma within the left psoas muscle body, measuring approximately 5.5 x 3.6 x 3.1 cm (series 3, image 88, series 6, image 122). IMPRESSION: 1. Status post median sternotomy and CABG with postoperative retrosternal stranding and fluid. There is no discrete fluid collection or other evidence of  postoperative complication identified. 2. The gallbladder is mildly distended with some suggestion of wall thickening and dependent calcified sludge. No discrete gallstones identified. If there is clinical concern for acute cholecystitis, right upper quadrant ultrasound or HIDA may be useful to further evaluate. 3. There is a small hematoma within the left psoas muscle body, measuring approximately 5.5 x 3.6 x 3.1 cm. 4. Small, right greater than left bilateral pleural effusions and associated atelectasis or consolidation. 5. Trace ascites and anasarca. 6. Extensive support apparatus as detailed above including tracheostomy and Impella device. 7. Redemonstrated calcified aneurysm of the infrarenal abdominal aorta measuring 4.5 x 4.3 cm. Recommend follow-up CT/MR every 6 months and vascular consultation when clinically appropriate. This recommendation follows ACR consensus guidelines: White Paper of the ACR Incidental Findings Committee II on Vascular Findings. J Am Coll Radiol 2013; 10:789-794. Aortic Atherosclerosis (ICD10-I70.0). Electronically Signed   By: Eddie Candle M.D.   On: 08/15/2020 11:39   US Abdomen Limited RUQ (LIVER/GB)  Result Date: 08/16/2020 CLINICAL DATA:  Fever EXAM: ULTRASOUND ABDOMEN LIMITED RIGHT UPPER QUADRANT COMPARISON:  CT Aug 15, 2020 FINDINGS: Gallbladder: Sludge in a dilated gallbladder with wall thickening measuring 3.5 mm and trace pericholecystic fluid. No cholelithiasis visualized. Common bile duct: Diameter: 3 mm Liver: No focal lesion identified. Within normal limits in parenchymal echogenicity. Portal vein is patent on color Doppler imaging with normal direction of blood flow towards the liver. Other: None. IMPRESSION: 1. Sludge in a  dilated gallbladder with wall thickening and trace pericholecystic fluid. No cholelithiasis visualized. Findings are suspicious for acute acalculous cholecystitis. Electronically Signed   By: Dahlia Bailiff MD   On: 08/16/2020 03:35     Labs:  CBC: Recent Labs    08/14/20 0428 08/15/20 0413 08/16/20 0429 08/17/20 0412  WBC 8.0 9.7 9.4 8.7  HGB 9.3* 9.3* 9.0* 8.5*  HCT 29.3* 30.0* 29.2* 27.2*  PLT 284 266 255 270    COAGS: Recent Labs    07/07/2020 1121 07/19/2020 1617 07/04/2020 1933 08/16/20 1327  INR 1.0 1.4* 1.5* 1.2  APTT  --  99* 41*  --     BMP: Recent Labs    08/15/20 0413 08/16/20 0429 08/16/20 1736 08/17/20 0412  NA 139 140 141 140  K 3.5 3.5 4.1 3.7  CL 100 103 104 105  CO2 '26 26 24 23  '$ GLUCOSE 229* 131* 50* 190*  BUN 80* 83* 87* 91*  CALCIUM 7.7* 7.6* 7.6* 7.7*  CREATININE 4.89* 3.98* 3.87* 3.75*  GFRNONAA 12* 16* 16* 17*    LIVER FUNCTION TESTS: Recent Labs    07/26/20 1409 07/27/20 0304 07/30/20 0559 08/12/20 0252 08/12/20 1521 08/13/20 0326 08/16/20 0429  BILITOT 1.2 1.1  --   --  1.0  --  1.8*  AST 26 21  --   --  81*  --  303*  ALT 13 14  --   --  60*  --  105*  ALKPHOS 28* 38  --   --  155*  --  517*  PROT 4.9* 5.0*  --   --  6.0*  --  5.8*  ALBUMIN 3.4* 3.1*   < > 1.9* 2.0*  2.0* 2.0* 1.8*   < > = values in this interval not displayed.    Assessment and Plan: 70 y.o. male with significant past medical history including AAA, anxiety, COPD, prior CVA, degenerative disc disease, depression, GERD, hypertension, ischemic cardiomyopathy/CHF, hypercholesterolemia, peripheral arterial disease with prior femoropopliteal bypass, prior seizure, sleep apnea, diabetes and prior foot osteomyelitis/left TMA.  He presented to [facility] in April of this year with chest pain and shortness of breath.  He subsequently underwent cardiac cath showing multivessel coronary artery disease and has since undergone CABG x5.  His postop course was complicated by arrest post CABG, status post CPR, need for Impella and inotropes, open chest at bedside with hematoma evacuation and returned OR for exploration.  He also developed acute kidney injury requiring CRRT  ,had trach placement for  resp failure and noted to have multiple posterior circulation infarcts on CT.  He has had intermittent fevers and leukocytosis while in ICU and has been treated with broad-spectrum antibiotics.  Recent imaging has showed some mild distention of the gallbladder with some suggestion of wall thickening and dependent calcified sludge but no discrete gallstones; s/p GB drain placement yesterday; temp 100, WBC nl; hgb 8.5(9), creat 3.75, bile cx neg to date; pt will need to keep GB drain at least 4-6 weeks unless GB removed in interim; cont with drain irrigation, OP monitoring; other plans as per CCM, NEPH, ID, TCTS, CARDS  Electronically Signed: D. Rowe Robert, PA-C 08/17/2020, 3:12 PM   I spent a total of 15 minutes at the the patient's bedside AND on the patient's hospital floor or unit, greater than 50% of which was counseling/coordinating care for gallbladder drain    Patient ID: Danny Carpenter, male   DOB: 23-Oct-1950, 70 y.o.   MRN: [id number]

## 2020-08-17 NOTE — Anesthesia Preprocedure Evaluation (Addendum)
Anesthesia Evaluation  Patient identified by MRN, date of birth, ID band Patient awake  General Assessment Comment:Does not follow commands  Reviewed: Allergy & Precautions, NPO status , Patient's Chart, lab work & pertinent test results, Unable to perform ROS - Chart review only  History of Anesthesia Complications (+) PONV  Airway Mallampati: Trach  TM Distance: >3 FB Neck ROM: Full    Dental  (+) Poor Dentition   Pulmonary sleep apnea (does not use CPAP) , COPD, former smoker,  Intubated (cardiogenic shock)   breath sounds clear to auscultation       Cardiovascular hypertension, + CAD, + Past MI, + CABG (4/26/ CABG x5: required Impella for cardiogenic shock), + Peripheral Vascular Disease (AAA 4.5cm) and +CHF  + dysrhythmias (Tors[place]) Ventricular Tachycardia  Rhythm:Irregular Rate:Normal  impella with Dobutamine support   Neuro/Psych Anxiety Depression 08/04/2020 CT: multiple post circulation infarcts, does not follow commands CVA    GI/Hepatic GERD  ,Elevated LFTs   Endo/Other  diabetes (glu 138), Insulin Dependent  Renal/GU ARFRenal disease (off of CVVH presently, K+ 4.1)     Musculoskeletal  (+) Arthritis ,   Abdominal (+) + obese,   Peds  Hematology  (+) Blood dyscrasia (Hb 8.3), anemia ,   Anesthesia Other Findings   Reproductive/Obstetrics                            Anesthesia Physical Anesthesia Plan  ASA: IV  Anesthesia Plan: General   Post-op Pain Management:    Induction: Intravenous and Inhalational  PONV Risk Score and Plan: 3 and Treatment may vary due to age or medical condition  Airway Management Planned: Tracheostomy  Additional Equipment: Arterial line and TEE  Intra-op Plan:   Post-operative Plan: Post-operative intubation/ventilation  Informed Consent: I have reviewed the patients History and Physical, chart, labs and discussed the procedure including the  risks, benefits and alternatives for the proposed anesthesia with the patient or authorized representative who has indicated his/her understanding and acceptance.     Consent reviewed with POA  Plan Discussed with: CRNA and Surgeon  Anesthesia Plan Comments: (Discussed with Daughter, Pincus Balyeat, by telephone)       Anesthesia Quick Evaluation

## 2020-08-17 NOTE — Progress Notes (Signed)
23 Days Post-Op Procedure(s) (LRB): EXPLORATION POST OPERATIVE OPEN HEART (N/A) Subjective: sedated  Objective: Vital signs in last 24 hours: Temp:  [98.1 F (36.7 C)-100.4 F (38 C)] 99.86 F (37.7 C) (05/19 0800) Pulse Rate:  [70-96] 80 (05/19 0800) Cardiac Rhythm: Atrial fibrillation (05/19 0800) Resp:  [7-24] 17 (05/19 0800) BP: (113-163)/(60-101) 141/89 (05/19 0800) SpO2:  [99 %-100 %] 100 % (05/19 0800) Arterial Line BP: (113-160)/(47-71) 142/67 (05/19 0800) FiO2 (%):  [40 %] 40 % (05/19 0757) Weight:  [114.1 kg] 114.1 kg (05/19 0500)  Hemodynamic parameters for last 24 hours: CVP:  [5 mmHg] 5 mmHg  Intake/Output from previous day: 05/18 0701 - 05/19 0700 In: 2014 [I.V.:922.5; NG/GT:495; IV Piggyback:250.8] Out: 1935 [Urine:1310; Drains:325; Stool:300] Intake/Output this shift: Total I/O In: 277 [I.V.:39; Other:10; NG/GT:220; IV Piggyback:8] Out: 60 [Urine:60]  General appearance: no distress Neurologic: unable to fully assess Heart: regular rate and rhythm, S1, S2 normal, no murmur, click, rub or gallop Lungs: clear to auscultation bilaterally Abdomen: soft, non-tender; bowel sounds normal; no masses,  no organomegaly Extremities: edema mild Wound: c/d/i  Lab Results: Recent Labs    08/16/20 0429 08/17/20 0412  WBC 9.4 8.7  HGB 9.0* 8.5*  HCT 29.2* 27.2*  PLT 255 270   BMET:  Recent Labs    08/16/20 1736 08/17/20 0412  NA 141 140  K 4.1 3.7  CL 104 105  CO2 24 23  GLUCOSE 50* 190*  BUN 87* 91*  CREATININE 3.87* 3.75*  CALCIUM 7.6* 7.7*    PT/INR:  Recent Labs    08/16/20 1327  LABPROT 15.4*  INR 1.2   ABG    Component Value Date/Time   PHART 7.598 (H) 08/11/2020 0356   HCO3 51.2 (H) 08/11/2020 0356   TCO2 43 (H) 08/11/2020 0742   ACIDBASEDEF 2.0 08/05/2020 0400   O2SAT 64.7 08/17/2020 0420   CBG (last 3)  Recent Labs    08/17/20 0022 08/17/20 0427 08/17/20 0711  GLUCAP 140* 187* 196*    Assessment/Plan: S/P Procedure(s)  (LRB): EXPLORATION POST OPERATIVE OPEN HEART (N/A) can remove Impella 1st thing Friday morning--     LOS: 28 days    Danny Carpenter 08/17/2020

## 2020-08-17 NOTE — Progress Notes (Signed)
[place] assisted with tele visit. Son [place].

## 2020-08-17 NOTE — Progress Notes (Signed)
eLink Physician-Brief Progress Note Patient Name: Danny Carpenter DOB: May 26, 1950 MRN: [id number]   Date of Service  08/17/2020  HPI/Events of Note  Patient to have tube feeds turned off at midnight for Impella removal in AM. Due to get Levemir 20 units tonight.   eICU Interventions  Plan: 1. Decrease Levemir dose to 10 units Java Q 12 hours. 2. Add D10 IV infusion if Blood glucose < 100.     Intervention Category Major Interventions: Hyperglycemia - active titration of insulin therapy  Lysle Dingwall 08/17/2020, 10:09 PM

## 2020-08-17 NOTE — Progress Notes (Signed)
ANTICOAGULATION CONSULT NOTE  Pharmacy Consult for Heparin Indication: Impella 5.5  No Known Allergies  Patient Measurements: Height: '6\' 1"'$  (185.4 cm) Weight: 114.1 kg (251 lb 8.7 oz) IBW/kg (Calculated) : 79.9 Heparin Dosing Weight: 102.8 kg  Vital Signs: Temp: 100.04 F (37.8 C) (05/19 1610) Temp Source: Bladder (05/19 1600) BP: 159/84 (05/19 1600) Pulse Rate: 83 (05/19 1610)  Labs: Recent Labs    08/15/20 0413 08/15/20 1632 08/16/20 0429 08/16/20 1327 08/16/20 1736 08/17/20 0018 08/17/20 0412 08/17/20 1552  HGB 9.3*  --  9.0*  --   --   --  8.5*  --   HCT 30.0*  --  29.2*  --   --   --  27.2*  --   PLT 266  --  255  --   --   --  270  --   LABPROT  --   --   --  15.4*  --   --   --   --   INR  --   --   --  1.2  --   --   --   --   HEPARINUNFRC 0.39   < > 0.37  --  <0.10* 0.13* 0.15* 0.14*  CREATININE 4.89*  --  3.98*  --  3.87*  --  3.75*  --    < > = values in this interval not displayed.    Estimated Creatinine Clearance: 25 mL/min (A) (by C-G formula based on SCr of 3.75 mg/dL (H)).   Assessment: 70 yo M presents with NSTEMI and multivessel CAD, now s/p CABG with Impella support. Pharmacy asked to manage systemic heparin. Heparin was held following IR guided drain placement yesterday evening. Heparin was restarted at [place].   Heparin level remains low.  Goal of Therapy:  Heparin level 0.2-0.3 units/ml Monitor platelets by anticoagulation protocol: Yes   Plan:  Contine heparin purge solution (50 units/ml) Increase heparin infusion slightly to 1200 units / hr q12h labs at [place], PharmD, BCPS, [facility] Clinical Pharmacist Please check AMION for all [place] numbers 08/17/2020

## 2020-08-17 NOTE — Progress Notes (Signed)
ANTICOAGULATION CONSULT NOTE - Initial Consult  Pharmacy Consult for heparin Indication: Impella  Labs: Recent Labs    08/14/20 0428 08/14/20 1650 08/15/20 0413 08/15/20 1632 08/16/20 0429 08/16/20 1327 08/16/20 1736 08/17/20 0018  HGB 9.3*  --  9.3*  --  9.0*  --   --   --   HCT 29.3*  --  30.0*  --  29.2*  --   --   --   PLT 284  --  266  --  255  --   --   --   LABPROT  --   --   --   --   --  15.4*  --   --   INR  --   --   --   --   --  1.2  --   --   HEPARINUNFRC 0.37   < > 0.39   < > 0.37  --  <0.10* 0.13*  CREATININE 4.84*  --  4.89*  --  3.98*  --  3.87*  --    < > = values in this interval not displayed.    Assessment/Plan:  70yo male subtherapeutic on heparin after resuming but has been paused a few times since and likely needs more time to accumulate. Will continue gtt at current rate of 1100 units/hr and check additional level.   Wynona Neat, PharmD, BCPS  08/17/2020,1:26 AM

## 2020-08-17 NOTE — Progress Notes (Signed)
    Progress Note from the Palliative Medicine Team at [facility]   Patient Name: Danny Carpenter        Date: 08/17/2020 DOB: 21-May-1950  Age: 70 y.o. MRN#: [id number] Attending Physician: Wonda Olds, MD Primary Care Physician: Serita Grammes, MD Admit Date: 07/24/2020   Medical records reviewed   70 y.o. male  admitted on 07/04/2020 with  past medical history significant for hypertension, hyperlipidemia, type 2 diabetes, CVA, AAA, left foot amputation s/p fem pop bypass and heart failure who presented to the emergency department 4/21 after undergoing outpatient stress test that revealed EF of 20% and acute ischemia.  Since admission patient was evaluated by cardiology and cardiothoracic surgery and patient underwent TEE and left heart cath. LHC/RHC revealed severe three-vessel coronary artery disease with moderately elevated left heart and pulmonary artery pressures. At that time patient was placed on goal-directed medical therapy for acute systolic congestive heart failure and CTS surgery was consulted for CABG potential.  Patient underwent CABG x5 with Dr. Orvan Seen 4/26, post CABG CODE BLUE called due to low flow on Impella, CTS notified. Patient underwent bedside exploratory postoperative open heart where Dr. Julien Girt reopened the chest incision and hematoma was removed, patient's hemodynamics improved upon opening sternotomy.  PCCM consulted 4/28 due to difficulty weaning ventilator.  He was trached  08-07-20  CRRT initiated on 08/01/2020, currently on hold.       Head CT on 08/04/2020 significant for multiple areas of posterior circulation infarct.  08-12-20 30 minutes in [place] until paced out, code status reversed  08-14-20 persistent fevers.  ID consulted, pan CT.    08-17-20-creatinine improved, still making urine but slowly   HD on hold for now. Patient appears to be intermittently more awake today  Decision made last week with  large family meeting to proceed with  trach,, PEG and continued current medical interventions in hopes improvement.  Spoke to daughter/Angel  by telephone.  Updated on current medical situation and answered questions to the best of my ability.  Family understands the seriousness of the current medical/situation.  They remain open to ongoing medical interventions and are open to all offered and available medical interventions to prolong life at this time.  Family is hopeful for improvement.  Glenard Haring tells me " they [place] from all over[place]"  Discussed with Glenard Haring the importance of continued conversation with family   and the  medical providers regarding overall plan of care and treatment options,  ensuring decisions are within the context of the patients values and GOCs and best interest.  PMT will continue to support holistically.   This nurse practitioner informed  the family and the attending that I will be out of the hospital until Monday morning.  If the patient is still hospitalized I will follow-up at that time.  Call palliative medicine team phone # [phone] with questions or concerns.  Total time spent on the unit was  15 minutes  Greater than 50% of the time was spent in counseling and coordination of care  Wadie Lessen NP  Palliative Medicine Team Team Phone # [phone] Pager [phone]

## 2020-08-17 NOTE — Progress Notes (Signed)
NAMEJayzion Carpenter, MRN:  [id number], DOB:  Sep 30, 1950, LOS: 32 ADMISSION DATE:  07/04/2020, CONSULTATION DATE: 07/28/2018 REFERRING MD: Dr. Orvan Seen, CHIEF COMPLAINT: Ventilator dependent  History of Present Illness:  Danny Carpenter is a 70 year old male with past medical history significant for hypertension, hyperlipidemia, type 2 diabetes, CVA, AAA, left foot amputation s/p fem pop bypass and heart failure who presented to the emergency department 4/21 after undergoing outpatient stress test that revealed EF of 20% and acute ischemia.  Since admission patient has been evaluated by cardiology and cardiothoracic surgery and patient underwent TEE and left heart cath.  LHC/RHC revealed severe three-vessel coronary artery disease with moderately elevated left heart and pulmonary artery pressures.  At that time patient was placed on goal-directed medical therapy for acute systolic congestive heart failure and CTS surgery was consulted for CABG potential.  Patient underwent CABG x5 with Dr. Orvan Seen 4/26, post CABG CODE BLUE called due to low flow on Impella, CTS notified.  Patient underwent bedside exploratory postoperative open heart where Dr. Julien Girt reopened the chest incision and hematoma was removed, patient's hemodynamics improved upon opening sternotomy.  PCCM consulted morning of 4/28 due to difficulty weaning ventilator  Pertinent  Medical History  Hypertension, hyperlipidemia, type 2 diabetes, CVA, AAA, left foot amputation s/p fem pop bypass and heart failure   Significant Hospital Events: Including procedures, antibiotic start and stop dates in addition to other pertinent events   . 4/21 presented to the ED for evaluation of abnormal stress test . 4/22 left and right heart cath severe multivessel disease . 4/26 CABG x5 with hemodynamic instability post requiring brief CPR with eventual return back to the OR anastomosis bleeding was seen . 4/28 remains ventilator dependent, slowly weaning  PEEP . 5/1 remains on vent, diuresing, no ready to come off  . 5/3 started on CRRT. . 5/5 requiring sedation for vent  . 5/6 CT scan shows multiple areas of posterior circulation infarction. . 5/8 tolerating CRRT with fluid removal.  Decreasing vasopressor requirements. . 5/12 TCT today . 5/13 VT event, self converted, febrile . 5/14 spent ~30 mins in [place]ors[place] until paced out, code status reversed by Dr. Julien Girt, patient spent 30 mins with no pulsatility and no flow except for the impella.  . 5/16 starting eraxis for persistent fevers . 5/17 remains febrile; planning to pan-CT . 5/18 perc bili drain placed for acalculous cholecystitis. Eraxis stopped by ID. Marland Kitchen 5/19 fentanyl turned off  Interim History / Subjective:  Recurrent arrhythmias yesterday. Tmax 100.4  Objective   Blood pressure 121/65, pulse 75, temperature 99.9 F (37.7 C), resp. rate 18, height '6\' 1"'$  (1.854 m), weight 114.1 kg, SpO2 100 %. CVP:  [5 mmHg] 5 mmHg  Vent Mode: PRVC FiO2 (%):  [40 %] 40 % Set Rate:  [20 bmp] 20 bmp Vt Set:  [630 mL] 630 mL PEEP:  [5 cmH20] 5 cmH20 Pressure Support:  [12 cmH20] 12 cmH20 Plateau Pressure:  [20 cmH20-23 cmH20] 20 cmH20   Intake/Output Summary (Last 24 hours) at 08/17/2020 0654 Last data filed at 08/17/2020 0600 Gross per 24 hour  Intake 2235 ml  Output 1980 ml  Net 255 ml   Filed Weights   08/14/20 0600 08/16/20 0500 08/17/20 0500  Weight: 110.4 kg 114.3 kg 114.1 kg   I/O +79, net -13.8L for admission UOP 1310 Biliary drain 325  Examination: General: ill appearing man lying in bed in NAD HEENT: [place]/AT PERRL right eye bandaged.  Improved conjunctival injection. Neck: Trach site without  bleeding Neuro: Turns his head towards verbal stimulation, but not tracking with his eyes or following commands.  Nonpurposeful movement in all extremities. CV: S2S2, regular rate, not in a paced rhythm PULM: Breathing synchronously with no significant tracheal secretions.  Mild rhonchi  bilaterally  GI: obese, soft, NT. Biliary drain with dark green fluid. Extremities: mild pedal edema, no cyanosis or clubbing. Forefoot amputation on the left. Skin: pallor, no rashes, few ecchymose   Labs/imaging that I have personally reviewed    5/16 blood cultures> NGTD 5/14 blood cx> NGTD  Labs reviewed Potassium 3.7 Bicarb 23 BUN 91, uptrending Creatinine 3.75 LDH 272, downtrending White blood cell count 8.7 H/H 8.5/27.2  coox 64.7% on impella P3, dobutamine  Biliary fluid culture 5/18> abundant WBC 5/16 & 5/14 blood cx> NGTD  Resolved Hospital Problem list     Assessment & Plan:   Acute hypoxic vent- dependent  respiratory failure  Tracheostomy dependent R pleural effusion, mild pulmonary edema  Plan: -Con't LTVV, 4-8cc/kg IBW; goal Pplat <30 and DP<15 -VAP prevention protocol -PAD protocol- stopping fentanyl infusion for neuroprognostication. -Routine trach care per protocol -Cont' vent weaning.  Cardiogenic shock due to ischemic cardiomyopathy CAD s/p CABG x5 -Continue Impella, weaning support.  Management per cardiology. Increased LDH from sepsis- better since adding eraxis. -Remains on norepinephrine and dobutamine. -Continue aspirin and statin. -Started mexiletine. Previously on lidocaine. -Continue monitoring on telemetry.   Acute Encephalopathy: multifactorial in setting of critical illness. Prolonged icu stay, posterior circulation CVA. Concern for anoxic brain injury during low flow state -Unable to get brain MRI due to pacing wires for further evaluation. Head CT today. - Avoid sedating meds. - Delirium precautions. - Continue to monitor renal function and renally dose medications.  Ongoing fevers- improving - HD catheter and Foley exchanged previously. Con't dialysis catheter as he is still at risk of requiring CRRT -hopefully removing impella and pacer wires today - Cultures pending --con't biliary drain; cx pending- Eraxis stopped, add  back zosyn.    AKI Oliguria improved - strict I/Os - Continue diuretics - Renally dose meds and avoid nephrotoxic meds -con't daily monitoring  Colonic ileus, resolved -Increasing TF towards goal, monitor for intolerance. Acute blood loss anemia due to operative blood loss and critical illness Thrombocytopenia -resolved - daily CBCs - Transfuse for hemoglobin less than 7 or hemodynamically significant bleeding  AAA -Supportive care; not a candidate for intervention  Hyperglycemia- controlled.  History of poorly controlled diabetes PTA. -Continue long-acting insulin + sliding scale insulin - Goal BG 140-180 while admitted to the ICU --increasing TF coverage Q4h  PAD s/p partial L foot amputation  -monitor peripheral circulation-- has been stable this week -Aspirin and statin daily  Best practice   Diet:  Tube Feed  - on hold for procedures Pain/Anxiety/Delirium protocol (if indicated): Yes (RASS goal 0)  VAP protocol (if indicated): Yes DVT prophylaxis: Systemic AC - Heparin  GI prophylaxis: PPI   Glucose control:  SSI Yes - SSI + basal  Central venous access:  Yes, and it is still needed, Arterial line:  Yes, and it is still needed  Foley:  Yes, and it is still needed  Mobility:  bed rest  PT consulted: N/A Last date of multidisciplinary goals of care discussion: ongoing  Code Status:  full code Disposition: ICU  This patient is critically ill with multiple organ system failure; which, requires frequent high complexity decision making, assessment, support, evaluation, and titration of therapies. This was completed through the application of advanced  monitoring technologies and extensive interpretation of multiple databases. During this encounter critical care time was devoted to patient care services described in this note for 41 minutes.  Julian Hy, DO [facility] 08/17/2020 10:59 AM

## 2020-08-18 ENCOUNTER — Inpatient Hospital Stay (HOSPITAL_COMMUNITY): Payer: No Typology Code available for payment source

## 2020-08-18 ENCOUNTER — Inpatient Hospital Stay (HOSPITAL_COMMUNITY): Payer: No Typology Code available for payment source | Admitting: Anesthesiology

## 2020-08-18 ENCOUNTER — Encounter (HOSPITAL_COMMUNITY): Admission: EM | Disposition: E | Payer: Self-pay | Source: Ambulatory Visit | Attending: Cardiothoracic Surgery

## 2020-08-18 DIAGNOSIS — I5021 Acute systolic (congestive) heart failure: Secondary | ICD-10-CM

## 2020-08-18 DIAGNOSIS — I6340 Cerebral infarction due to embolism of unspecified cerebral artery: Secondary | ICD-10-CM | POA: Diagnosis not present

## 2020-08-18 DIAGNOSIS — R570 Cardiogenic shock: Secondary | ICD-10-CM | POA: Diagnosis not present

## 2020-08-18 DIAGNOSIS — K810 Acute cholecystitis: Secondary | ICD-10-CM | POA: Diagnosis not present

## 2020-08-18 DIAGNOSIS — N179 Acute kidney failure, unspecified: Secondary | ICD-10-CM | POA: Diagnosis not present

## 2020-08-18 HISTORY — PX: REMOVAL OF IMPELLA LEFT VENTRICULAR ASSIST DEVICE: SHX6556

## 2020-08-18 HISTORY — PX: TEE WITHOUT CARDIOVERSION: SHX5443

## 2020-08-18 LAB — BASIC METABOLIC PANEL
Anion gap: 12 (ref 5–15)
BUN: 94 mg/dL — ABNORMAL HIGH (ref 8–23)
CO2: 22 mmol/L (ref 22–32)
Calcium: 7.9 mg/dL — ABNORMAL LOW (ref 8.9–10.3)
Chloride: 107 mmol/L (ref 98–111)
Creatinine, Ser: 3.68 mg/dL — ABNORMAL HIGH (ref 0.61–1.24)
GFR, Estimated: 17 mL/min — ABNORMAL LOW (ref >60)
Glucose, Bld: 141 mg/dL — ABNORMAL HIGH (ref 70–99)
Potassium: 4.1 mmol/L (ref 3.5–5.1)
Sodium: 141 mmol/L (ref 135–145)

## 2020-08-18 LAB — CBC
HCT: 26.5 % — ABNORMAL LOW (ref 39.0–52.0)
Hemoglobin: 8.3 g/dL — ABNORMAL LOW (ref 13.0–17.0)
MCH: 29.1 pg (ref 26.0–34.0)
MCHC: 31.3 g/dL (ref 30.0–36.0)
MCV: 93 fL (ref 80.0–100.0)
Platelets: 312 10*3/uL (ref 150–400)
RBC: 2.85 MIL/uL — ABNORMAL LOW (ref 4.22–5.81)
RDW: 19 % — ABNORMAL HIGH (ref 11.5–15.5)
WBC: 9 10*3/uL (ref 4.0–10.5)
nRBC: 0 % (ref 0.0–0.2)

## 2020-08-18 LAB — ECHO INTRAOPERATIVE TEE
Height: 73 in
Weight: 3841.3 oz

## 2020-08-18 LAB — HEPATIC FUNCTION PANEL
ALT: 48 U/L — ABNORMAL HIGH (ref 0–44)
AST: 45 U/L — ABNORMAL HIGH (ref 15–41)
Albumin: 1.9 g/dL — ABNORMAL LOW (ref 3.5–5.0)
Alkaline Phosphatase: 418 U/L — ABNORMAL HIGH (ref 38–126)
Bilirubin, Direct: 0.4 mg/dL — ABNORMAL HIGH (ref 0.0–0.2)
Indirect Bilirubin: 0.8 mg/dL (ref 0.3–0.9)
Total Bilirubin: 1.2 mg/dL (ref 0.3–1.2)
Total Protein: 6.2 g/dL — ABNORMAL LOW (ref 6.5–8.1)

## 2020-08-18 LAB — COOXEMETRY PANEL
Carboxyhemoglobin: 1.5 % (ref 0.5–1.5)
Methemoglobin: 0.6 % (ref 0.0–1.5)
O2 Saturation: 68 %
Total hemoglobin: 8.8 g/dL — ABNORMAL LOW (ref 12.0–16.0)

## 2020-08-18 LAB — GLUCOSE, CAPILLARY
Glucose-Capillary: 163 mg/dL — ABNORMAL HIGH (ref 70–99)
Glucose-Capillary: 138 mg/dL — ABNORMAL HIGH (ref 70–99)
Glucose-Capillary: 266 mg/dL — ABNORMAL HIGH (ref 70–99)
Glucose-Capillary: 283 mg/dL — ABNORMAL HIGH (ref 70–99)
Glucose-Capillary: 299 mg/dL — ABNORMAL HIGH (ref 70–99)

## 2020-08-18 LAB — HEPARIN LEVEL (UNFRACTIONATED)
Heparin Unfractionated: 0.11 IU/mL — ABNORMAL LOW (ref 0.30–0.70)
Heparin Unfractionated: <0.1 IU/mL — ABNORMAL LOW (ref 0.30–0.70)

## 2020-08-18 LAB — LACTATE DEHYDROGENASE: LDH: 259 U/L — ABNORMAL HIGH (ref 98–192)

## 2020-08-18 SURGERY — REMOVAL, CARDIAC ASSIST DEVICE, IMPELLA
Anesthesia: General

## 2020-08-18 MED ORDER — MIDAZOLAM HCL 2 MG/2ML IJ SOLN
INTRAMUSCULAR | Status: AC
  Filled 2020-08-18: qty 2

## 2020-08-18 MED ORDER — HEPARIN (PORCINE) 25000 UT/250ML-% IV SOLN
1850.0000 [IU]/h | INTRAVENOUS | Status: DC
  Administered 2020-08-18: 1300 [IU]/h via INTRAVENOUS
  Administered 2020-08-19: 1700 [IU]/h via INTRAVENOUS
  Administered 2020-08-20 – 2020-08-21 (×3): 2250 [IU]/h via INTRAVENOUS
  Administered 2020-08-22 – 2020-08-23 (×2): 2150 [IU]/h via INTRAVENOUS
  Filled 2020-08-18 (×9): qty 250

## 2020-08-18 MED ORDER — MILRINONE LACTATE IN DEXTROSE 20-5 MG/100ML-% IV SOLN
0.1250 ug/kg/min | INTRAVENOUS | Status: DC
  Administered 2020-08-18 – 2020-08-21 (×4): 0.125 ug/kg/min via INTRAVENOUS
  Filled 2020-08-18 (×4): qty 100

## 2020-08-18 MED ORDER — HEMOSTATIC AGENTS (NO CHARGE) OPTIME
TOPICAL | Status: DC | PRN
  Administered 2020-08-18: 1 via TOPICAL

## 2020-08-18 MED ORDER — FENTANYL CITRATE (PF) 250 MCG/5ML IJ SOLN
INTRAMUSCULAR | Status: AC
  Filled 2020-08-18: qty 5

## 2020-08-18 MED ORDER — ROCURONIUM BROMIDE 10 MG/ML (PF) SYRINGE
PREFILLED_SYRINGE | INTRAVENOUS | Status: AC
  Filled 2020-08-18: qty 10

## 2020-08-18 MED ORDER — PROPOFOL 1000 MG/100ML IV EMUL
INTRAVENOUS | Status: AC
  Filled 2020-08-18: qty 100

## 2020-08-18 MED ORDER — ROCURONIUM BROMIDE 10 MG/ML (PF) SYRINGE
PREFILLED_SYRINGE | INTRAVENOUS | Status: DC | PRN
  Administered 2020-08-18: 50 mg via INTRAVENOUS

## 2020-08-18 MED ORDER — EPINEPHRINE HCL 5 MG/250ML IV SOLN IN NS
0.5000 ug/min | INTRAVENOUS | Status: AC
  Administered 2020-08-18: 2 ug/min via INTRAVENOUS
  Filled 2020-08-18: qty 250

## 2020-08-18 MED ORDER — EPINEPHRINE HCL 5 MG/250ML IV SOLN IN NS
0.5000 ug/min | INTRAVENOUS | Status: DC
Start: 2020-08-18 — End: 2020-08-19

## 2020-08-18 MED ORDER — INSULIN DETEMIR 100 UNIT/ML ~~LOC~~ SOLN
12.0000 [IU] | Freq: Two times a day (BID) | SUBCUTANEOUS | Status: DC
  Administered 2020-08-18: 12 [IU] via SUBCUTANEOUS
  Filled 2020-08-18 (×3): qty 0.12

## 2020-08-18 MED ORDER — INSULIN ASPART 100 UNIT/ML IJ SOLN
0.0000 [IU] | INTRAMUSCULAR | Status: DC
  Administered 2020-08-18 (×2): 8 [IU] via SUBCUTANEOUS
  Administered 2020-08-19: 5 [IU] via SUBCUTANEOUS
  Administered 2020-08-19: 8 [IU] via SUBCUTANEOUS
  Administered 2020-08-19 (×2): 5 [IU] via SUBCUTANEOUS
  Administered 2020-08-19 (×2): 8 [IU] via SUBCUTANEOUS
  Administered 2020-08-19: 3 [IU] via SUBCUTANEOUS
  Administered 2020-08-20 (×3): 5 [IU] via SUBCUTANEOUS
  Administered 2020-08-20: 11 [IU] via SUBCUTANEOUS
  Administered 2020-08-20 – 2020-08-22 (×9): 5 [IU] via SUBCUTANEOUS
  Administered 2020-08-22 (×4): 3 [IU] via SUBCUTANEOUS
  Administered 2020-08-22: 8 [IU] via SUBCUTANEOUS
  Administered 2020-08-23: 2 [IU] via SUBCUTANEOUS
  Administered 2020-08-23: 3 [IU] via SUBCUTANEOUS

## 2020-08-18 MED ORDER — FENTANYL CITRATE (PF) 250 MCG/5ML IJ SOLN
INTRAMUSCULAR | Status: DC | PRN
  Administered 2020-08-18: 100 ug via INTRAVENOUS
  Administered 2020-08-18: 50 ug via INTRAVENOUS

## 2020-08-18 MED ORDER — LIDOCAINE 2% (20 MG/ML) 5 ML SYRINGE
INTRAMUSCULAR | Status: AC
  Filled 2020-08-18: qty 5

## 2020-08-18 MED ORDER — 0.9 % SODIUM CHLORIDE (POUR BTL) OPTIME
TOPICAL | Status: DC | PRN
  Administered 2020-08-18: 1000 mL

## 2020-08-18 MED FILL — Medication: Qty: 1 | Status: AC

## 2020-08-18 SURGICAL SUPPLY — 43 items
ADH SKN CLS APL DERMABOND .7 (GAUZE/BANDAGES/DRESSINGS) ×1
AGENT HMST 10 BLLW SHRT CANN (HEMOSTASIS) ×1
BLADE CLIPPER SURG (BLADE) ×2 IMPLANT
BRUSH SCRUB EZ PLAIN DRY (MISCELLANEOUS) ×3 IMPLANT
CLIP VESOCCLUDE LG 6/CT (CLIP) ×2 IMPLANT
COVER SURGICAL LIGHT HANDLE (MISCELLANEOUS) ×2 IMPLANT
DERMABOND ADVANCED (GAUZE/BANDAGES/DRESSINGS) ×1
DERMABOND ADVANCED .7 DNX12 (GAUZE/BANDAGES/DRESSINGS) IMPLANT
DRAIN CHANNEL 10F 3/8 F FF (DRAIN) ×1 IMPLANT
DRAIN HEMOVAC 1/8 X 5 (WOUND CARE) IMPLANT
DRAPE WARM FLUID 44X44 (DRAPES) ×2 IMPLANT
DRSG AQUACEL AG ADV 3.5X 6 (GAUZE/BANDAGES/DRESSINGS) ×1 IMPLANT
EVACUATOR SILICONE 100CC (DRAIN) ×1 IMPLANT
GAUZE SPONGE 4X4 12PLY STRL (GAUZE/BANDAGES/DRESSINGS) ×1 IMPLANT
GAUZE SPONGE 4X4 12PLY STRL LF (GAUZE/BANDAGES/DRESSINGS) ×1 IMPLANT
GLOVE NEODERM STRL 7.5  LF PF (GLOVE) ×1
GLOVE NEODERM STRL 7.5 LF PF (GLOVE) ×1 IMPLANT
GLOVE SURG NEODERM 7.5  LF PF (GLOVE) ×1
GLOVE SURG UNDER POLY LF SZ7 (GLOVE) ×1 IMPLANT
GOWN STRL REUS W/ TWL LRG LVL3 (GOWN DISPOSABLE) ×2 IMPLANT
GOWN STRL REUS W/TWL LRG LVL3 (GOWN DISPOSABLE) ×10
HEMOSTAT HEMOBLAST BELLOWS (HEMOSTASIS) ×1 IMPLANT
INSERT FOGARTY SM (MISCELLANEOUS) ×2 IMPLANT
KIT BASIN OR (CUSTOM PROCEDURE TRAY) ×2 IMPLANT
NS IRRIG 1000ML POUR BTL (IV SOLUTION) ×3 IMPLANT
PACK GENERAL/GYN (CUSTOM PROCEDURE TRAY) ×2 IMPLANT
PACK UNIVERSAL I (CUSTOM PROCEDURE TRAY) ×2 IMPLANT
PAD ARMBOARD 7.5X6 YLW CONV (MISCELLANEOUS) ×4 IMPLANT
PAD ELECT DEFIB RADIOL ZOLL (MISCELLANEOUS) ×2 IMPLANT
SLEEVE SCD COMPRESS KNEE MED (STOCKING) ×1 IMPLANT
STAPLER VISISTAT 35W (STAPLE) IMPLANT
SUT ETHILON 3 0 PS 1 (SUTURE) ×1 IMPLANT
SUT MNCRL AB 4-0 PS2 18 (SUTURE) ×3 IMPLANT
SUT PROLENE 3 0 SH1 36 (SUTURE) ×1 IMPLANT
SUT PROLENE 4 0 RB 1 (SUTURE) ×2
SUT PROLENE 4-0 RB1 .5 CRCL 36 (SUTURE) ×1 IMPLANT
SUT SILK  1 MH (SUTURE) ×8
SUT SILK 1 MH (SUTURE) ×4 IMPLANT
SUT SILK 1 TIES 10X30 (SUTURE) ×2 IMPLANT
SUT VIC AB 2-0 CT1 36 (SUTURE) ×2 IMPLANT
TOWEL GREEN STERILE (TOWEL DISPOSABLE) ×2 IMPLANT
TOWEL GREEN STERILE FF (TOWEL DISPOSABLE) ×2 IMPLANT
WATER STERILE IRR 1000ML POUR (IV SOLUTION) ×3 IMPLANT

## 2020-08-18 NOTE — Progress Notes (Addendum)
ANTICOAGULATION CONSULT NOTE  Pharmacy Consult for Heparin Indication: stroke  No Known Allergies  Patient Measurements: Height: '6\' 1"'$  (185.4 cm) Weight: 108.9 kg (240 lb 1.3 oz) IBW/kg (Calculated) : 79.9 Heparin Dosing Weight: 102.8 kg  Vital Signs: Temp: 100.22 F (37.9 C) (05/20 0700) BP: 140/68 (05/20 0700) Pulse Rate: 81 (05/20 0700)  Labs: Recent Labs    08/16/20 0429 08/16/20 1327 08/16/20 1736 08/17/20 0412 08/17/20 1552 07/30/2020 0528  HGB 9.0*  --   --  8.5*  --  8.3*  HCT 29.2*  --   --  27.2*  --  26.5*  PLT 255  --   --  270  --  312  LABPROT  --  15.4*  --   --   --   --   INR  --  1.2  --   --   --   --   HEPARINUNFRC 0.37  --    < > 0.15* 0.14* 0.11*  CREATININE 3.98*  --    < > 3.75* 3.76* 3.68*   < > = values in this interval not displayed.    Estimated Creatinine Clearance: 24.9 mL/min (A) (by C-G formula based on SCr of 3.68 mg/dL (H)).   Assessment: 70 yo M presents with NSTEMI and multivessel CAD, now s/p CABG with Impella support. Pharmacy asked to manage systemic heparin. Heparin was held following IR guided drain placement and this morning had removal of Impella 5.5. Anticoagulation to be continued for stroke.   Heparin level this AM prior to surg subtherapeutic at 0.11 CBC: Hgb low stable 8.3,Plt 312  Goal of Therapy:  Heparin level 0.3-0.5 units/ml Monitor platelets by anticoagulation protocol: Yes   Plan:  Discontine heparin purge solution (50 units/ml) now that Impella is removed  Increase heparin infusion slightly to 1300 units / hr to start 2hr post-op at 1130 1700 HL  F/u s/sx bleeding, CBC, HL daily  Wilson Singer, PharmD PGY1 Pharmacy Resident 08/02/2020 9:06 AM

## 2020-08-18 NOTE — Transfer of Care (Signed)
Immediate Anesthesia Transfer of Care Note  Patient: Danny Carpenter  Procedure(s) Performed: REMOVAL OF IMPELLA LEFT VENTRICULAR ASSIST DEVICE (N/A ) TRANSESOPHAGEAL ECHOCARDIOGRAM (TEE) (N/A )  Patient Location: SICU  Anesthesia Type:General  Level of Consciousness: sedated and Patient remains intubated per anesthesia plan, Trach  Airway & Oxygen Therapy: Patient placed on Ventilator (see vital sign flow sheet for setting)  Post-op Assessment: Report given to RN and Post -op Vital signs reviewed and stable  Post vital signs: Reviewed and stable  Last Vitals:  Vitals Value Taken Time  BP    Temp    Pulse 92 08/09/2020 0859  Resp 0 08/28/2020 0859  SpO2 95 % 08/17/2020 0859  Vitals shown include unvalidated device data.  Last Pain:  Vitals:   08/17/20 1600  TempSrc: Bladder  PainSc:       Patients Stated Pain Goal: 2 ([id number])  Complications: No complications documented.

## 2020-08-18 NOTE — Progress Notes (Signed)
Referring Physician(s): Dr. Carlis Abbott  Supervising Physician: Daryll Brod  Patient Status:  [facility] - In-pt  Chief Complaint: Cholecystitis   Subjective: S/p OR this AM for Impella removal.  Intubated, sedated.  RUQ drain in place.   Allergies: Patient has no known allergies.  Medications: Prior to Admission medications   Medication Sig Start Date End Date Taking? Authorizing Provider  amLODipine (NORVASC) 10 MG tablet Take 1 tablet (10 mg total) by mouth daily. 01/21/19  Yes Masoudi, Elhamalsadat, MD  aspirin EC 81 MG tablet Take 81 mg by mouth daily. Swallow whole.   Yes [provider]  clopidogrel (PLAVIX) 75 MG tablet Take 75 mg by mouth daily.   Yes [provider]  DULoxetine (CYMBALTA) 30 MG capsule Take 30 mg by mouth daily.   Yes [provider]  gabapentin (NEURONTIN) 600 MG tablet Take 600 mg by mouth 3 (three) times daily.   Yes [provider]  Insulin Degludec (TRESIBA) 100 UNIT/ML SOLN Inject 62 Units into the skin in the morning. Every morning to control blood sugar   Yes [provider]  levocetirizine (XYZAL) 5 MG tablet Take 5 mg by mouth at bedtime.   Yes [provider]  lisinopril (ZESTRIL) 40 MG tablet Take 40 mg by mouth daily.   Yes [provider]  metFORMIN (GLUCOPHAGE) 1000 MG tablet Take 1,000 mg by mouth 2 (two) times daily with a meal.   Yes [provider]  nitroGLYCERIN (NITROSTAT) 0.4 MG SL tablet Place 0.4 mg under the tongue every 5 (five) minutes as needed for chest pain.   Yes [provider]  pantoprazole (PROTONIX) 40 MG tablet Take 40 mg by mouth daily before breakfast.   Yes [provider]  rosuvastatin (CRESTOR) 20 MG tablet Take 20 mg by mouth daily.   Yes [provider]  Vitamin D, Ergocalciferol, (DRISDOL) 1.25 MG (50000 UNIT) CAPS capsule Take 50,000 Units by mouth 2 (two) times a week.   Yes [provider]     Vital  Signs: BP (!) 113/55   Pulse 82   Temp 99.14 F (37.3 C)   Resp (!) 30   Ht '6\' 1"'$  (1.854 m)   Wt 240 lb 1.3 oz (108.9 kg)   SpO2 99%   BMI 31.67 kg/m   Physical Exam  NAD, intubated, sedated Abdomen: soft, non-distended. RUQ drain in place.  Insertion site c/d/i. Bloody, bilious output in foley bag.   Imaging: CT HEAD WO CONTRAST  Result Date: 08/15/2020 CLINICAL DATA:  Delirium, acute encephalopathy, CABG, multiple infarcts EXAM: CT HEAD WITHOUT CONTRAST TECHNIQUE: Contiguous axial images were obtained from the base of the skull through the vertex without intravenous contrast. COMPARISON:  07/30/2020 FINDINGS: Brain: No evidence of new infarction, hemorrhage, hydrocephalus, extra-axial collection or mass lesion/mass effect. Hypodensity of the right cerebellar pedicle is again noted (series 7, image 10); other previously seen parenchymal hypodensities less well appreciated. Vascular: No hyperdense vessel or unexpected calcification. Skull: Normal. Negative for fracture or focal lesion. Sinuses/Orbits: No acute finding. Other: None. IMPRESSION: 1. No acute intracranial pathology. 2. Hypodensity of the right cerebellar pedicle is again noted; other previously seen parenchymal hypodensities less well appreciated. Findings are generally in keeping with expected evolution of small subacute infarctions. No evidence of hemorrhage or other complication. MRI is more sensitive for the assessment of acutely superimposed diffusion restricting infarction if clinically suspected. Electronically Signed   By: Eddie Candle M.D.   On: 08/15/2020 11:22   IR  Perc Cholecystostomy  Result Date: 08/16/2020 INDICATION: 70 year old male with sepsis and equivocal imaging findings of acute acalculous cholecystitis. EXAM: Percutaneous cholecystostomy tube placement MEDICATIONS: The patient is receiving systemic antibiotics as an inpatient which require no additional prophylactic coverage. ANESTHESIA/SEDATION: The patient  was monitored by the ICU nurse throughout the procedure, no additional sedation was required. FLUOROSCOPY TIME:  Fluoroscopy Time: 1 minutes 18 seconds (15 mGy). COMPLICATIONS: None immediate. PROCEDURE: Informed written consent was obtained from the patient after a thorough discussion of the procedural risks, benefits and alternatives. All questions were addressed. Maximal Sterile Barrier Technique was utilized including caps, mask, sterile gowns, sterile gloves, sterile drape, hand hygiene and skin antiseptic. A timeout was performed prior to the initiation of the procedure. The patient was placed supine on the angiographic table. The patient's right upper quadrant was then prepped and draped in normal sterile fashion with maximum sterile barrier. Ultrasound demonstrates a distended gallbladder. Subdermal Local anesthesia was provided at the planned skin entry site. Under ultrasound guidance, deeper local anesthetic was provided through intercostal muscles and along the liver capsule. Ultrasound was used to puncture the gallbladder using a 21 gauge Chiba needle via a transhepatic approach with visualization of the lung treated to the gallbladder. A 0.018 inch wire was advanced into the lumen and a transition dilator placed. A gentle hand injection of contrast was performed. Cholecystogram demonstrates amorphous filling of the gallbladder lumen. No filling defects suggestive of gallstones. The cystic duct is obstructed. A 0.035 inch exchange wire was placed in the tract was dilated. A 10.2 French multipurpose drainage catheter was advanced into the gallbladder lumen. The drain was then secured in place using a 0-silk suture and a Stayfix device. A sterile dressing was applied. The tube was placed to bag drainage. A culture was sent to the lab for analysis. The patient tolerated procedure well without evidence of immediate complication was transferred back to the floor in stable condition. IMPRESSION: Successful  placement of percutaneous, transhepatic cholecystostomy tube. Ruthann Cancer, MD [facility] Electronically Signed   By: Ruthann Cancer MD   On: 08/16/2020 17:00   DG Chest Port 1 View  Result Date: 08/16/2020 CLINICAL DATA:  Status post PICC line placement. EXAM: PORTABLE CHEST 1 VIEW COMPARISON:  Aug 15, 2020 FINDINGS: There is stable tracheostomy tube, nasogastric tube, right jugular venous catheter, left subclavian venous catheter and Impella device positioning. Multiple sternal wires are noted. Mild linear atelectasis is seen within the bilateral lung bases. There is no evidence of a pleural effusion or pneumothorax. The cardiac silhouette is enlarged and unchanged in size. Multiple left-sided rib fractures are again seen. IMPRESSION: No significant interval change when compared to the prior study, dated Aug 15, 2020. Electronically Signed   By: Virgina Norfolk M.D.   On: 08/16/2020 19:14   DG CHEST PORT 1 VIEW  Result Date: 08/15/2020 CLINICAL DATA:  Shortness of breath EXAM: PORTABLE CHEST 1 VIEW COMPARISON:  Aug 14, 2020. FINDINGS: Tracheostomy catheter tip is 5.8 cm above the carina. Enteric tube tip below stomach. Impella device present, unchanged in position. Right jugular catheter tip in superior vena cava. Port-A-Cath tip in superior vena cava. No pneumothorax. There is mild bibasilar atelectasis. No edema or consolidation. Heart is upper normal in size with pulmonary vascularity normal. There is aortic atherosclerosis. No adenopathy no bone lesions. IMPRESSION: Tube and catheter positions as described pneumothorax. Mild bibasilar atelectasis. Stable cardiac silhouette. Electronically Signed   By: Lowella Grip III M.D.  On: 08/15/2020 08:01   VAS [place] LOWER EXTREMITY VENOUS (DVT)  Result Date: 08/16/2020  Lower Venous DVT Study Patient Name:  [facility]  Date of Exam:   08/16/2020 Medical Rec #: [id number]      Accession #:     [id number] Date of Birth: [birth year]     Patient Gender: M Patient Age:   69Y Exam Location:  [facility] Procedure:      VAS [place] LOWER EXTREMITY VENOUS (DVT) Referring Phys: [id number] [place] --------------------------------------------------------------------------------  Indications: Edema.  Comparison Study: 08/11/20 previous Performing Technologist: Abram Sander RVS  Examination Guidelines: A complete evaluation includes B-mode imaging, spectral Doppler, color Doppler, and power Doppler as needed of all accessible portions of each vessel. Bilateral testing is considered an integral part of a complete examination. Limited examinations for reoccurring indications may be performed as noted. The reflux portion of the exam is performed with the patient in reverse Trendelenburg.  +---------+---------------+---------+-----------+----------+-------------------+ RIGHT    CompressibilityPhasicitySpontaneityPropertiesThrombus Aging      +---------+---------------+---------+-----------+----------+-------------------+ CFV      Full           Yes      Yes                                      +---------+---------------+---------+-----------+----------+-------------------+ SFJ      Full                                                             +---------+---------------+---------+-----------+----------+-------------------+ FV Prox  Full                                                             +---------+---------------+---------+-----------+----------+-------------------+ FV Mid   Full                                                             +---------+---------------+---------+-----------+----------+-------------------+ FV DistalFull                                                             +---------+---------------+---------+-----------+----------+-------------------+ PFV      Full                                                              +---------+---------------+---------+-----------+----------+-------------------+ POP      Full           Yes      Yes                                      +---------+---------------+---------+-----------+----------+-------------------+  PTV      Full                                                             +---------+---------------+---------+-----------+----------+-------------------+ PERO                                                  Not well visualized +---------+---------------+---------+-----------+----------+-------------------+   +---------+---------------+---------+-----------+----------+-------------------+ LEFT     CompressibilityPhasicitySpontaneityPropertiesThrombus Aging      +---------+---------------+---------+-----------+----------+-------------------+ CFV      Full           Yes      Yes                                      +---------+---------------+---------+-----------+----------+-------------------+ SFJ      Full                                                             +---------+---------------+---------+-----------+----------+-------------------+ FV Prox  Full                                                             +---------+---------------+---------+-----------+----------+-------------------+ FV Mid   Full                                                             +---------+---------------+---------+-----------+----------+-------------------+ FV DistalFull                                                             +---------+---------------+---------+-----------+----------+-------------------+ PFV      Full                                                             +---------+---------------+---------+-----------+----------+-------------------+ POP      Full           Yes      Yes                                      +---------+---------------+---------+-----------+----------+-------------------+  PTV      Full                                                             +---------+---------------+---------+-----------+----------+-------------------+  PERO                                                  Not well visualized +---------+---------------+---------+-----------+----------+-------------------+     Summary: BILATERAL: - No evidence of deep vein thrombosis seen in the lower extremities, bilaterally. -No evidence of popliteal cyst, bilaterally.   *See table(s) above for measurements and observations. Electronically signed by Curt Jews MD on 08/16/2020 at 8:37:02 PM.    Final    ECHOCARDIOGRAM LIMITED  Result Date: 08/15/2020    ECHOCARDIOGRAM LIMITED REPORT   Patient Name:   Danny Carpenter Date of Exam: 08/15/2020 Medical Rec #:  [id number]     Height:       73.0 in Accession #:    [id number]    Weight:       243.4 lb Date of Birth:  [birth year]    BSA:          2.339 m Patient Age:    45 years      BP:           130/67 mmHg Patient Gender: M             HR:           80 bpm. Exam Location:  Inpatient Procedure: Limited Echo Indications:    CHF-Acute Systolic [id number]  History:        Patient has prior history of Echocardiogram examinations, most                 recent 08/10/2020. Signs/Symptoms:Fever. Cardiogenic shock:                 Ischemic cardiomyopathy, post-CABG on 4/26. Impella 5.5 down to                 P4. Thrombocytopenia, Anemia, AAA, Acute kidney injury.  Sonographer:    Darlina Sicilian RDCS Referring Phys: [place]  Conclusion(s)/Recommendation(s): LIMITED ECHO for Impella cannula placement. Left Ventricle: Left ventricular ejection fraction, by estimation, is <20%. The left ventricle has severely decreased function. Compared to prior, Similar cannular placement (5.2 cm from the annulus). Rudean Haskell MD Electronically signed by Rudean Haskell MD Signature Date/Time: 08/15/2020/11:01:39 AM    Final    [place] EKG SITE RITE  Result Date: 08/15/2020 If  Site Rite image not attached, placement could not be confirmed due to current cardiac rhythm.  CT CHEST ABDOMEN PELVIS WO CONTRAST  Result Date: 08/15/2020 CLINICAL DATA:  Sepsis, recent CABG EXAM: CT CHEST, ABDOMEN AND PELVIS WITHOUT CONTRAST TECHNIQUE: Multidetector CT imaging of the chest, abdomen and pelvis was performed following the standard protocol without IV contrast. COMPARISON:  09/28/2018 FINDINGS: CT CHEST FINDINGS Cardiovascular: Aortic atherosclerosis. Cardiomegaly status post median sternotomy and CABG. Extensive 3 vessel coronary artery calcifications and/or stents. Right subclavian approach Impella device position within the left ventricle. Right internal jugular and left subclavian venous catheters. No pericardial effusion. Mediastinum/Nodes: No enlarged mediastinal, hilar, or axillary lymph nodes. Tracheostomy. Thyroid and esophagus demonstrate no significant findings. Lungs/Pleura: Small, right greater than left bilateral pleural effusions and associated atelectasis or consolidation. Musculoskeletal: No chest wall mass or suspicious bone lesions identified. Status post median sternotomy and CABG with postoperative retrosternal stranding and fluid (series 3, image 26). There is no discrete fluid collection identified. Superficial metallic pellets in the left anterior chest wall. CT ABDOMEN PELVIS  FINDINGS Hepatobiliary: No solid liver abnormality is seen. The gallbladder is mildly distended with some suggestion of wall thickening and dependent calcified sludge. No discrete gallstones identified. No biliary ductal dilatation. Pancreas: Unremarkable. No pancreatic ductal dilatation or surrounding inflammatory changes. Spleen: Normal in size without significant abnormality. Adrenals/Urinary Tract: Adrenal glands are unremarkable. Small nonobstructive calculus of the superior pole of the right kidney. No hydronephrosis. Bladder is decompressed by a Foley catheter. Thickening of the urinary  bladder wall. Stomach/Bowel: Stomach is within normal limits. Enteric feeding tube with tip in the duodenal bulb. Appendix is not clearly visualized. No evidence of bowel wall thickening, distention, or inflammatory changes. Rectal tube. Vascular/Lymphatic: Aortic atherosclerosis. Redemonstrated calcified aneurysm of the infrarenal abdominal aorta measuring 4.5 x 4.3 cm (series 3, image 80). No enlarged abdominal or pelvic lymph nodes. Reproductive: No mass or other abnormality. Other: No abdominal wall hernia or abnormality. Anasarca. Trace ascites throughout the abdomen and pelvis. Musculoskeletal: No acute or significant osseous findings. There is a small hematoma within the left psoas muscle body, measuring approximately 5.5 x 3.6 x 3.1 cm (series 3, image 88, series 6, image 122). IMPRESSION: 1. Status post median sternotomy and CABG with postoperative retrosternal stranding and fluid. There is no discrete fluid collection or other evidence of postoperative complication identified. 2. The gallbladder is mildly distended with some suggestion of wall thickening and dependent calcified sludge. No discrete gallstones identified. If there is clinical concern for acute cholecystitis, right upper quadrant ultrasound or HIDA may be useful to further evaluate. 3. There is a small hematoma within the left psoas muscle body, measuring approximately 5.5 x 3.6 x 3.1 cm. 4. Small, right greater than left bilateral pleural effusions and associated atelectasis or consolidation. 5. Trace ascites and anasarca. 6. Extensive support apparatus as detailed above including tracheostomy and Impella device. 7. Redemonstrated calcified aneurysm of the infrarenal abdominal aorta measuring 4.5 x 4.3 cm. Recommend follow-up CT/MR every 6 months and vascular consultation when clinically appropriate. This recommendation follows ACR consensus guidelines: White Paper of the ACR Incidental Findings Committee II on Vascular Findings. J Am Coll  Radiol 2013; 10:789-794. Aortic Atherosclerosis (ICD10-I70.0). Electronically Signed   By: Eddie Candle M.D.   On: 08/15/2020 11:39   US Abdomen Limited RUQ (LIVER/GB)  Result Date: 08/16/2020 CLINICAL DATA:  Fever EXAM: ULTRASOUND ABDOMEN LIMITED RIGHT UPPER QUADRANT COMPARISON:  CT Aug 15, 2020 FINDINGS: Gallbladder: Sludge in a dilated gallbladder with wall thickening measuring 3.5 mm and trace pericholecystic fluid. No cholelithiasis visualized. Common bile duct: Diameter: 3 mm Liver: No focal lesion identified. Within normal limits in parenchymal echogenicity. Portal vein is patent on color Doppler imaging with normal direction of blood flow towards the liver. Other: None. IMPRESSION: 1. Sludge in a dilated gallbladder with wall thickening and trace pericholecystic fluid. No cholelithiasis visualized. Findings are suspicious for acute acalculous cholecystitis. Electronically Signed   By: Dahlia Bailiff MD   On: 08/16/2020 03:35    Labs:  CBC: Recent Labs    08/15/20 0413 08/16/20 0429 08/17/20 0412 08/11/2020 0528  WBC 9.7 9.4 8.7 9.0  HGB 9.3* 9.0* 8.5* 8.3*  HCT 30.0* 29.2* 27.2* 26.5*  PLT 266 255 270 312    COAGS: Recent Labs    07/29/2020 1121 07/08/2020 1617 07/26/2020 1933 08/16/20 1327  INR 1.0 1.4* 1.5* 1.2  APTT  --  99* 41*  --     BMP: Recent Labs    08/16/20 1736 08/17/20 0412 08/17/20 1552 08/27/2020 0528  NA 141 140  143 141  K 4.1 3.7 4.4 4.1  CL 104 105 107 107  CO2 '24 23 24 22  '$ GLUCOSE 50* 190* 164* 141*  BUN 87* 91* 93* 94*  CALCIUM 7.6* 7.7* 7.7* 7.9*  CREATININE 3.87* 3.75* 3.76* 3.68*  GFRNONAA 16* 17* 17* 17*    LIVER FUNCTION TESTS: Recent Labs    07/27/20 0304 07/30/20 0559 08/12/20 1521 08/13/20 0326 08/16/20 0429 08/02/2020 0528  BILITOT 1.1  --  1.0  --  1.8* 1.2  AST 21  --  81*  --  303* 45*  ALT 14  --  60*  --  105* 48*  ALKPHOS 38  --  155*  --  517* 418*  PROT 5.0*  --  6.0*  --  5.8* 6.2*  ALBUMIN 3.1*   < > 2.0*  2.0* 2.0*  1.8* 1.9*   < > = values in this interval not displayed.    Assessment and Plan: Sepsis s/p cholecystostomy tube placement 08/16/20 Patient remains critically ill.  WBC remains WNL. On zosyn.  NGTD from cultures.  Bloody, bilious drainage. 550 mL output overnight.  Continues pressors.  Concern for posterior circulation stroke with acute encephalopathy.   Electronically Signed: Docia Barrier, PA 08/17/2020, 2:46 PM   I spent a total of 15 Minutes at the the patient's bedside AND on the patient's hospital floor or unit, greater than 50% of which was counseling/coordinating care for sepsis.

## 2020-08-18 NOTE — H&P (Signed)
History and Physical Interval Note:  08/11/2020 7:25 AM  Danny Carpenter  has presented today for surgery, with the diagnosis of CHF.  The various methods of treatment have been discussed with the patient and family. After consideration of risks, benefits and other options for treatment, the patient has consented to  Procedure(s): REMOVAL OF IMPELLA LEFT VENTRICULAR ASSIST DEVICE (N/A) TRANSESOPHAGEAL ECHOCARDIOGRAM (TEE) (N/A) as a surgical intervention.  The patient's history has been reviewed, patient examined, no change in status, stable for surgery.  I have reviewed the patient's chart and labs.  Questions were answered to the patient's satisfaction.     Wonda Olds

## 2020-08-18 NOTE — Progress Notes (Signed)
[facility]  Date of Admission:  07/27/2020           Reason for visit: Follow up on fevers  Current antibiotics: Piperacillin tazobactam 5/18--present  Previous antibiotics: Cefepime 4/26 >> 5/05 Meropenem 5/10 >5/16 Vancomycin 4/26 >>4/30 Eraxis 5/16 >5/18  ASSESSMENT:    Fevers: Multiple potential etiologies with concern for a calculus cholecystitis status post IR cholecystostomy tube drain placement on 5/18.  Cultures currently no growth, however, he was on antibiotics around the time of this procedure.  Query the possibility of neurologic mediated fevers as well given his significant neurologic injury Elevated LFTs: Improving AKI: Requiring CRRT earlier this admission but currently off dialysis at this time.  Temporary dialysis catheter remains in place Cardiogenic shock: Impella device was removed today Type 2 diabetes Multiple posterior circulation infarcts: Eventually will need head MRI   PLAN:    Continue piperacillin tazobactam per pharmacy dosed for current renal function.  Anticipate empiric 7-day course unless cultures suggest otherwise Monitor fever curve, LFTs Consider neurologic etiology of fevers if they continue despite adequately treating his suspected acalculous cholecystitis as other ID work up has been negative Dr. Linus Salmons available over the weekend as needed otherwise Dr. West Bali will follow-up on Monday   Principal Problem:   Acute acalculous cholecystitis Active Problems:   Acute on chronic heart failure (HCC)   Non-ST elevation (NSTEMI) myocardial infarction [facility])   Essential hypertension   History of open heart surgery   Cardiogenic shock ([place])   Coronary artery disease involving coronary bypass graft of native heart with unstable angina pectoris (HCC)   Pleural effusion on right   AKI (acute kidney injury) ([place])   Cerebral embolism with cerebral infarction   Pressure injury of skin   Fever   Palliative  care by specialist    MEDICATIONS:    Scheduled Meds: . aspirin EC  325 mg Oral Daily   Or  . aspirin  324 mg Per Tube Daily  . chlorhexidine gluconate (MEDLINE KIT)  15 mL Mouth Rinse BID  . Chlorhexidine Gluconate Cloth  6 each Topical Daily  . darbepoetin (ARANESP) injection - NON-DIALYSIS  150 mcg Subcutaneous Q Wed-1800  . feeding supplement (PROSource TF)  45 mL Per Tube 5 X Daily  . ferrous [id number] C-folic acid  1 capsule Oral BID PC  . fiber  1 packet Per Tube BID  . Gerhardt's butt cream   Topical QID  . insulin aspart  3-9 Units Subcutaneous Q4H  . insulin detemir  10 Units Subcutaneous BID  . levalbuterol  0.63 mg Nebulization TID  . mouth rinse  15 mL Mouth Rinse 10 times per day  . mexiletine  200 mg Per Tube Q12H  . midodrine  10 mg Per Tube TID WC  . pantoprazole sodium  40 mg Per Tube Daily  . rosuvastatin  10 mg Per Tube Daily  . sodium chloride flush  10-40 mL Intracatheter Q12H  . sodium chloride flush  5 mL Intracatheter Q8H   Continuous Infusions: . sodium chloride 15 mL/hr at 08/16/20 1500  . sodium chloride    . sodium chloride    . sodium chloride 10 mL/hr at 08/07/2020 0735  . dextrose Stopped (08/17/20 1116)  . epinephrine 1 mcg/min (08/14/2020 1000)  . feeding supplement (VITAL 1.5 CAL) 1,000 mL (08/05/2020 0957)  . heparin 1,300 Units/hr (08/27/2020 1113)  . milrinone 0.125 mcg/kg/min (08/21/2020 1006)  . piperacillin-tazobactam (ZOSYN)  IV Stopped (07/31/2020 0350)  PRN Meds:.sodium chloride, Place/Maintain arterial line **AND** sodium chloride, acetaminophen (TYLENOL) oral liquid 160 mg/5 mL, fentaNYL (SUBLIMAZE) injection, polyvinyl alcohol, sodium chloride flush  SUBJECTIVE:   24 hour events:  No acute events noted overnight Impella removed this morning Minimally interactive Continues on dobutamine as well as epinephrine Continued fevers T-max 100.9.  WBC normal, LFTs improved, BMP stable  Not interactive.  Unable to obtain  subjective  Review of Systems  Unable to perform ROS: Intubated      OBJECTIVE:   Blood pressure (!) 133/57, pulse 80, temperature 99.32 F (37.4 C), resp. rate (!) 32, height 6' 1"  (1.854 m), weight 108.9 kg, SpO2 99 %. Body mass index is 31.67 kg/m.  Physical Exam Constitutional:      Comments: Chronically ill-appearing, lying in bed  HENT:     Head: Normocephalic and atraumatic.  Neck:     Comments: Tracheostomy in place Abdominal:     General: There is no distension.     Palpations: Abdomen is soft.     Tenderness: There is no abdominal tenderness.     Comments: Biliary drain in place  Musculoskeletal:     Comments: Status post left TMA  Skin:    General: Skin is warm and dry.     Findings: No rash.  Neurological:     Comments: Minimally interactive      Lab Results: Lab Results  Component Value Date   WBC 9.0 08/12/2020   HGB 8.3 (L) 08/13/2020   HCT 26.5 (L) 08/21/2020   MCV 93.0 08/09/2020   PLT 312 08/25/2020    Lab Results  Component Value Date   NA 141 08/01/2020   K 4.1 08/13/2020   CO2 22 08/22/2020   GLUCOSE 141 (H) 08/01/2020   BUN 94 (H) 08/07/2020   CREATININE 3.68 (H) 08/20/2020   CALCIUM 7.9 (L) 08/07/2020   GFRNONAA 17 (L) 08/07/2020   GFRAA >60 08/08/2019    Lab Results  Component Value Date   ALT 48 (H) 08/15/2020   AST 45 (H) 08/28/2020   ALKPHOS 418 (H) 08/23/2020   BILITOT 1.2 08/27/2020    No results found for: CRP  No results found for: ESRSEDRATE   I have reviewed the micro and lab results in Epic.  Imaging: IR Perc Cholecystostomy  Result Date: 08/16/2020 INDICATION: 70 year old male with sepsis and equivocal imaging findings of acute acalculous cholecystitis. EXAM: Percutaneous cholecystostomy tube placement MEDICATIONS: The patient is receiving systemic antibiotics as an inpatient which require no additional prophylactic coverage. ANESTHESIA/SEDATION: The patient was monitored by the ICU nurse throughout the  procedure, no additional sedation was required. FLUOROSCOPY TIME:  Fluoroscopy Time: 1 minutes 18 seconds (15 mGy). COMPLICATIONS: None immediate. PROCEDURE: Informed written consent was obtained from the patient after a thorough discussion of the procedural risks, benefits and alternatives. All questions were addressed. Maximal Sterile Barrier Technique was utilized including caps, mask, sterile gowns, sterile gloves, sterile drape, hand hygiene and skin antiseptic. A timeout was performed prior to the initiation of the procedure. The patient was placed supine on the angiographic table. The patient's right upper quadrant was then prepped and draped in normal sterile fashion with maximum sterile barrier. Ultrasound demonstrates a distended gallbladder. Subdermal Local anesthesia was provided at the planned skin entry site. Under ultrasound guidance, deeper local anesthetic was provided through intercostal muscles and along the liver capsule. Ultrasound was used to puncture the gallbladder using a 21 gauge Chiba needle via a transhepatic approach with visualization of the lung treated to  the gallbladder. A 0.018 inch wire was advanced into the lumen and a transition dilator placed. A gentle hand injection of contrast was performed. Cholecystogram demonstrates amorphous filling of the gallbladder lumen. No filling defects suggestive of gallstones. The cystic duct is obstructed. A 0.035 inch exchange wire was placed in the tract was dilated. A 10.2 French multipurpose drainage catheter was advanced into the gallbladder lumen. The drain was then secured in place using a 0-silk suture and a Stayfix device. A sterile dressing was applied. The tube was placed to bag drainage. A culture was sent to the lab for analysis. The patient tolerated procedure well without evidence of immediate complication was transferred back to the floor in stable condition. IMPRESSION: Successful placement of percutaneous, transhepatic  cholecystostomy tube. Ruthann Cancer, MD [facility] Radiology Electronically Signed   By: Ruthann Cancer MD   On: 08/16/2020 17:00   DG Chest Port 1 View  Result Date: 08/16/2020 CLINICAL DATA:  Status post PICC line placement. EXAM: PORTABLE CHEST 1 VIEW COMPARISON:  Aug 15, 2020 FINDINGS: There is stable tracheostomy tube, nasogastric tube, right jugular venous catheter, left subclavian venous catheter and Impella device positioning. Multiple sternal wires are noted. Mild linear atelectasis is seen within the bilateral lung bases. There is no evidence of a pleural effusion or pneumothorax. The cardiac silhouette is enlarged and unchanged in size. Multiple left-sided rib fractures are again seen. IMPRESSION: No significant interval change when compared to the prior study, dated Aug 15, 2020. Electronically Signed   By: Virgina Norfolk M.D.   On: 08/16/2020 19:14     Imaging independently reviewed in Epic.    [facility]ious [facility] [phone] pager 08/01/2020, 11:57 AM  I spent greater than 35 minutes with the patient including greater than 50% of time in face to face counsel of the patient and in coordination of their care.

## 2020-08-18 NOTE — Progress Notes (Signed)
Admit: 07/19/2020 LOS: 18  71M progressive AKI after CABG with prolonged shock (cardiogenic and ? Vasodilatory/septic), failure to achieve target volume status despite aggressive diuretics.    Catheter: right IJ Temp IJ CCM placed 5/14  S: Febrile again over the past 24 hours.  Impella removed this morning.  Currently on milrinone and epinephrine.  Blood pressure excellent.  Minimally interactive.   O: 05/19 0701 - 05/20 0700 In: 2145.4 [I.V.:562.8; NG/GT:951.5; IV Piggyback:331.6] Out: 1607 [Urine:2820; Drains:550; Stool:250]  Vitals:   08/22/2020 0700 08/26/2020 0858  BP: 140/68   Pulse: 81   Resp: 18 (!) 23  Temp: 100.22 F (37.9 C)   SpO2: 99% 99%     PE Eyes lying in bed in no distress Lungs-bilateral chest rise, coarse upper airway sounds CV-normal rate, sternotomy scar Abd-no pain with palpation, soft Ext-minimal pitting edema  Filed Weights   08/16/20 0500 08/17/20 0500 08/09/2020 0500  Weight: 114.3 kg 114.1 kg 108.9 kg    Recent Labs  Lab 08/12/20 0252 08/12/20 0738 08/12/20 1521 08/13/20 0326 08/13/20 1526 08/17/20 0412 08/17/20 1552 08/25/2020 0528  NA 137   < > 138  139  139 140   < > 140 143 141  K 3.2*   < > 3.8  3.8  3.8 3.3*   < > 3.7 4.4 4.1  CL 87*   < > 89*  90*  90* 90*   < > 105 107 107  CO2 37*   < > 34*  34*  35* 33*   < > _0 GLUCOSE 186*   < > 225*  224*  225* 125*   < > 190* 164* 141*  BUN 42*   < > 53*  52*  53* 62*   < > 91* 93* 94*  CREATININE 3.34*   < > 4.00*  3.88*  3.94* 4.33*   < > 3.75* 3.76* 3.68*  CALCIUM 8.3*   < > 8.0*  8.1*  8.1* 8.3*   < > 7.7* 7.7* 7.9*  PHOS 3.2  --  5.0* 5.0*  --   --   --   --    < > = values in this interval not displayed.   Recent Labs  Lab 08/12/20 0738 08/13/20 0306 08/13/20 1526 08/14/20 0428 08/16/20 0429 08/17/20 0412 08/19/2020 0528  WBC 10.2   < > 8.6   < > 9.4 8.7 9.0  NEUTROABS 7.4  --  5.9  --   --   --   --   HGB 7.5*   < > 9.2*   < > 9.0* 8.5* 8.3*  HCT 24.4*    < > 28.9*   < > 29.2* 27.2* 26.5*  MCV 92.1   < > 89.5   < > 93.6 93.2 93.0  PLT 306   < > 303   < > 255 270 312   < > = values in this interval not displayed.    Scheduled Meds: . aspirin EC  325 mg Oral Daily   Or  . aspirin  324 mg Per Tube Daily  . chlorhexidine gluconate (MEDLINE KIT)  15 mL Mouth Rinse BID  . Chlorhexidine Gluconate Cloth  6 each Topical Daily  . darbepoetin (ARANESP) injection - NON-DIALYSIS  150 mcg Subcutaneous Q Wed-1800  . feeding supplement (PROSource TF)  45 mL Per Tube 5 X Daily  . ferrous [id number] C-folic acid  1 capsule Oral BID PC  . fiber  1 packet Per Tube BID  .  Gerhardt's butt cream   Topical QID  . insulin aspart  3-9 Units Subcutaneous Q4H  . insulin detemir  10 Units Subcutaneous BID  . levalbuterol  0.63 mg Nebulization TID  . mouth rinse  15 mL Mouth Rinse 10 times per day  . mexiletine  200 mg Per Tube Q12H  . midodrine  10 mg Per Tube TID WC  . pantoprazole sodium  40 mg Per Tube Daily  . rosuvastatin  10 mg Per Tube Daily  . sodium chloride flush  10-40 mL Intracatheter Q12H  . sodium chloride flush  5 mL Intracatheter Q8H   Continuous Infusions: . sodium chloride 15 mL/hr at 08/16/20 1500  . sodium chloride    . sodium chloride    . sodium chloride 10 mL/hr at 08/20/2020 0735  . dextrose Stopped (08/17/20 1116)  . epinephrine 2 mcg/min (08/26/2020 0900)  . feeding supplement (VITAL 1.5 CAL) 1,000 mL (07/30/2020 0957)  . heparin    . milrinone 0.125 mcg/kg/min (08/28/2020 1006)  . piperacillin-tazobactam (ZOSYN)  IV Stopped (08/06/2020 0350)   PRN Meds:.sodium chloride, Place/Maintain arterial line **AND** sodium chloride, acetaminophen (TYLENOL) oral liquid 160 mg/5 mL, fentaNYL (SUBLIMAZE) injection, polyvinyl alcohol, sodium chloride flush  ABG    Component Value Date/Time   PHART 7.598 (H) 08/11/2020 0356   PCO2ART 52.8 (H) 08/11/2020 0356   PO2ART 127 (H) 08/11/2020 0356   HCO3 51.2 (H) 08/11/2020 0356   TCO2 43 (H)  08/11/2020 0742   ACIDBASEDEF 2.0 08/05/2020 0400   O2SAT 68.0 08/17/2020 0528    A/P  1. Dialysis dependent AKI  secondary to  cardiorenal syndrome probably some ATN s/p CABG.  Required CRRT from 5/3 to 5/13.  BL Crt <2.  Renal parameters including creatinine slowly improving. Likely multifactorial but overall stable/reassuring trend. Volume status acceptable. Okay to continue holding rrt.  2. Acute systolic CHF / ICM; impella now removed. Currently on milrinone and epinephrine hopefully will continue to wean.  Management per primary.  Urine output adequate.  Could consider Lasix if needed 3. CAD s/p  5V CABG 4/26 4. Anemia-ESA on board-  Transfuse PRN-  Given one unit on 5/12 5. DM2 6. PAD hx/o Fem Pop Bypass 7. VDRF -remains ventilated 8. Numerous ischemic bilateral posterior CVAs, neurology following-- this is the major issue 9. Fevers: Extensive fevers.  Initially improved but now persistent.  Antibiotics and treatment per primary team   agree with goals of care conversation, grim prognosis given his prolonged neurological insult.  Partial code-  But proceeding with peg and trach.   Fortunately he has not needed renal replacement therapy for several days.   Reesa Chew, MD  [facility]

## 2020-08-18 NOTE — Progress Notes (Signed)
  Echocardiogram Echocardiogram Transesophageal has been performed.  Danny Carpenter 08/23/2020, 8:42 AM

## 2020-08-18 NOTE — Brief Op Note (Signed)
07/16/2020 - 08/11/2020  8:32 AM  PATIENT:  Danny Carpenter  70 y.o. male  PRE-OPERATIVE DIAGNOSIS:  CONGESTIVE HEART FAILURE  POST-OPERATIVE DIAGNOSIS:  CONGESTIVE HEART FAILURE  PROCEDURE:  Procedure(s): REMOVAL OF IMPELLA LEFT VENTRICULAR ASSIST DEVICE (N/A) TRANSESOPHAGEAL ECHOCARDIOGRAM (TEE) (N/A)  SURGEON:  Surgeon(s) and Role:  Wonda Olds, MD - Primary  PHYSICIAN ASSISTANT: n/a  ASSISTANTS: staff   ANESTHESIA:   general  EBL:  20 mL   BLOOD ADMINISTERED:none  DRAINS: (10 Fr) Jackson-Pratt drain(s) with closed bulb suction in the right axillary wound bed   LOCAL MEDICATIONS USED:  NONE  SPECIMEN:  Source of Specimen:  stump of impella graft for gram stain, culture  DISPOSITION OF SPECIMEN:  micro  COUNTS:  YES  DICTATION: .Note written in EPIC  PLAN OF CARE: Admit to inpatient   PATIENT DISPOSITION:  ICU - intubated and hemodynamically stable.   Delay start of Pharmacological VTE agent (>24hrs) due to surgical blood loss or risk of bleeding: no

## 2020-08-18 NOTE — Progress Notes (Addendum)
Patient ID: Danny Carpenter, male   DOB: 10-07-1950, 70 y.o.   MRN: [id number]     Advanced Heart Failure Rounding Note  PCP-Cardiologist: None   Subjective:    - 4/26 S/P CABG - 5/4 Swan removed with ongoing fevers, CVVH started - CT head on 5/6 with multiple posterior circulation infarcts.  - 5/10 tracheostomy. CT head similar to prior with multiple small infarcts.  - 5/14 Torsades -> paced out by Dr. Orvan Seen. Impella turned back up from P3 -> P7  after code. Amio gtt switched to lidocaine.   - 5/16 Eraxis added for persistent fevers. Cultures resent>>pending  - 5/17 CT chest/abdomen/pelvis and abdominal US suggestive of acalculous cholecystitis - 5/18 cholecystostomy tube - 5/20 Return to the OR today for Impella removal. TEE with EF 30%-->started on epi 2 mcg with improved EF to 50%.   Continues on mexiletine, had NSVT yesterday in IR but now stable.     Making > 2.8 liters urine. Creatinine 3.7>3.6   Sedated.    Objective:   Weight Range: 108.9 kg Body mass index is 31.67 kg/m.   Vital Signs:   Temp:  [99.32 F (37.4 C)-100.94 F (38.3 C)] 100.22 F (37.9 C) (05/20 0700) Pulse Rate:  [71-90] 81 (05/20 0700) Resp:  [14-30] 23 (05/20 0858) BP: (126-162)/(56-102) 140/68 (05/20 0700) SpO2:  [96 %-100 %] 99 % (05/20 0858) Arterial Line BP: (96-156)/(47-67) 110/61 (05/20 0700) FiO2 (%):  [40 %] 40 % (05/20 0858) Weight:  [108.9 kg] 108.9 kg (05/20 0500) Last BM Date: 08/17/20  Weight change: Filed Weights   08/16/20 0500 08/17/20 0500 08/09/2020 0500  Weight: 114.3 kg 114.1 kg 108.9 kg    Intake/Output:   Intake/Output Summary (Last 24 hours) at 08/02/2020 0910 Last data filed at 08/21/2020 6808 Gross per 24 hour  Intake 2149.95 ml  Output 3560 ml  Net -1410.05 ml      Physical Exam  CVP 10  General:  No resp difficulty HEENT: Sedated Neck: Track ,supple. no JVD. Carotids 2+ bilat; no bruits. No lymphadenopathy or thryomegaly appreciated. Cor: PMI  nondisplaced. Irregular rate & rhythm. No rubs, gallops or murmurs. Sternal incision. JP drain R upper chest  Lungs: clear Abdomen: soft, nontender, nondistended. No hepatosplenomegaly. No bruits or masses. Good bowel sounds. R upper quadrant chole tube.  Extremities: no cyanosis, clubbing, rash, edema Neuro: Sedated. GU: Foley     Telemetry    A Fib    Labs    CBC Recent Labs    08/17/20 0412 08/14/2020 0528  WBC 8.7 9.0  HGB 8.5* 8.3*  HCT 27.2* 26.5*  MCV 93.2 93.0  PLT 270 811   Basic Metabolic Panel Recent Labs    08/17/20 0412 08/17/20 1552 08/17/2020 0528  NA 140 143 141  K 3.7 4.4 4.1  CL 105 107 107  CO2 _0 GLUCOSE 190* 164* 141*  BUN 91* 93* 94*  CREATININE 3.75* 3.76* 3.68*  CALCIUM 7.7* 7.7* 7.9*  MG 2.4 2.3  --    Liver Function Tests Recent Labs    08/16/20 0429 08/03/2020 0528  AST 303* 45*  ALT 105* 48*  ALKPHOS 517* 418*  BILITOT 1.8* 1.2  PROT 5.8* 6.2*  ALBUMIN 1.8* 1.9*   No results for input(s): LIPASE, AMYLASE in the last 72 hours. Cardiac Enzymes No results for input(s): CKTOTAL, CKMB, CKMBINDEX, TROPONINI in the last 72 hours.  BNP: BNP (last 3 results) Recent Labs    07/29/2020 1211  BNP 465.5*  ProBNP (last 3 results) No results for input(s): PROBNP in the last 8760 hours.   D-Dimer No results for input(s): DDIMER in the last 72 hours. Hemoglobin A1C No results for input(s): HGBA1C in the last 72 hours. Fasting Lipid Panel No results for input(s): CHOL, HDL, LDLCALC, TRIG, CHOLHDL, LDLDIRECT in the last 72 hours. Thyroid Function Tests No results for input(s): TSH, T4TOTAL, T3FREE, THYROIDAB in the last 72 hours.  Invalid input(s): FREET3  Other results:   Imaging    No results found.   Medications:     Scheduled Medications: . aspirin EC  325 mg Oral Daily   Or  . aspirin  324 mg Per Tube Daily  . chlorhexidine gluconate (MEDLINE KIT)  15 mL Mouth Rinse BID  . Chlorhexidine Gluconate Cloth  6  each Topical Daily  . darbepoetin (ARANESP) injection - NON-DIALYSIS  150 mcg Subcutaneous Q Wed-1800  . feeding supplement (PROSource TF)  45 mL Per Tube 5 X Daily  . ferrous [id number] C-folic acid  1 capsule Oral BID PC  . fiber  1 packet Per Tube BID  . Gerhardt's butt cream   Topical QID  . insulin aspart  3-9 Units Subcutaneous Q4H  . insulin detemir  10 Units Subcutaneous BID  . levalbuterol  0.63 mg Nebulization TID  . mouth rinse  15 mL Mouth Rinse 10 times per day  . mexiletine  200 mg Per Tube Q12H  . midodrine  10 mg Per Tube TID WC  . pantoprazole sodium  40 mg Per Tube Daily  . rosuvastatin  10 mg Per Tube Daily  . sodium chloride flush  10-40 mL Intracatheter Q12H  . sodium chloride flush  5 mL Intracatheter Q8H    Infusions: . sodium chloride 15 mL/hr at 08/16/20 1500  . sodium chloride    . sodium chloride    . sodium chloride 10 mL/hr at 08/26/2020 0735  . dextrose Stopped (08/17/20 1116)  . DOBUTamine Stopped (08/16/2020 0856)  . feeding supplement (VITAL 1.5 CAL) Stopped (08/29/2020 0000)  . heparin    . norepinephrine (LEVOPHED) Adult infusion Stopped (08/17/20 0645)  . piperacillin-tazobactam (ZOSYN)  IV Stopped (08/22/2020 0350)    PRN Medications: sodium chloride, Place/Maintain arterial line **AND** sodium chloride, acetaminophen (TYLENOL) oral liquid 160 mg/5 mL, fentaNYL (SUBLIMAZE) injection, polyvinyl alcohol, sodium chloride flush  Assessment/Plan   1. Cardiogenic shock: Ischemic cardiomyopathy, post-CABG on 4/26.  He has Impella 5.5 in place, down to P4 with no alarms and stable LDH.  Limited echo 4/29 with EF< 20%, the RV appears normal in size with severe systolic dysfunction.   Echo with some improvement, EF in 30-35% range on 5/9 echo. Suspect component of septic/distributive shock. Impella out today. EF 30% intraop improved to 50% on Epi.  - BP running high. Can come off epi and start milrinone 0.125 mcg to support RV.  - Hold parameters  placed for midodrine.  CVP 10-11 2. CAD: s/p CABG x 5 with LIMA-LAD, seq SVG-D1/ramus, seq SVG-PDA/PLV.   - ASA  - Crestor 3. Anemia: Post-op bleeding, back to OR with multiple products given on 4/26 post-CABG.  Hgb 8.3  - Transfuse < 7.5    4. Thrombocytopenia: Resolved. suspect low post-op/post-surgical bleeding and multiple blood products as well as sepsis.  Platelets 5. PAD: Extensive history.  6. AAA: Monitoring as outpatient.  7. Type 2 DM: Insulin.  Hgb A1C 11.6, poor control.  8. Atrial fibrillation: Rate controlled.   - Off amio with torsades and  possible QT prolongation - Restart heparin gtt. 9. Neuro: intubated and sedated, not following commands with sedation wean.  Head CT 5/6 with multiple posterior circulation infarcts. Head CT 5/10 no change.  Awake. Per nursing staff, has intermittently followed commands but not for me today. NH3 was not elevated.  - Neuro has seen.  - Palliative Care following.  - Eventually needs MRI head.  10. ID:  PCT 7.5 on 4/29. Cultures so far negative. Tm 100.4.  Now with cholecystostomy drain for acalculous cholecystitis. Off Eraxis now and on Zosyn for biliary coverage. ID following.  11. Acute hypoxemic respiratory failure: S/p tracheostomy on 5/10. Has had PNA.  - trach management per PCCM  12. AKI:  CVVH started with marked volume overload and worsening renal function.  CVVH off currently, making > 2 liters urine.  Creatinine down but BUN up.  - Concerned that he may require HD again eventually.  13. Ileus: Improved 14. Torsades: Paced out by Dr Orvan Seen on 5/14. Amiodarone stopped.  - continue Mexiletine  15. GOC: was limited code and now changed back to Full Code 16. Acute acalculous cholecystitis: S/p biliary drain placement.   - Zosyn for biliary coverage.  17. AAA: 4.5 cm on CT.   Amy Clegg NP-C  08/19/2020 9:10 AM  Patient seen with NP, agree with the above note.  He just returned from the OR after getting Impella out.   Currently on epinephrine 1.  Co-ox 68% today.  TEE in OR showed EF around 30% with RV dysfunction. CVP 10-11.    Eyes open, does not follow commands.   Tm 100.9, remains on Zosyn.  Biliary drain in place.   General: NAD Neck: Thick, JVP 10 cm, no thyromegaly or thyroid nodule. Trach Lungs: Decreased at bases.  CV: Nondisplaced PMI.  Heart regular S1/S2, no S3/S4, no murmur. 1+ ankle edema. Abdomen: Soft, nontender, no hepatosplenomegaly, no distention.  Skin: Intact without lesions or rashes.  Neurologic: Eyes open, does not follow commands.  Extremities: No clubbing or cyanosis.  HEENT: Normal.   Creatinine stable 3.68, JVP 10-11.  I will give him Lasix 60 mg IV x 1 today.    Co-ox 68%, came back form OR on epinephrine 2, now down to 1. MAP elevated.  - Add milrinone 0.125.  - Titrate off epinephrine.   He remains in atrial fibrillation.  Will resume heparin gtt.   No further VT, he is on mexiletine.   Eyes open, does not follow commands.  Eventually will need head MRI.   Low grade fever, remains on Zosyn for biliary coverage.   CRITICAL CARE Performed by: Loralie Champagne  Total critical care time: 35 minutes  Critical care time was exclusive of separately billable procedures and treating other patients.  Critical care was necessary to treat or prevent imminent or life-threatening deterioration.  Critical care was time spent personally by me on the following activities: development of treatment plan with patient and/or surrogate as well as nursing, discussions with consultants, evaluation of patient's response to treatment, examination of patient, obtaining history from patient or surrogate, ordering and performing treatments and interventions, ordering and review of laboratory studies, ordering and review of radiographic studies, pulse oximetry and re-evaluation of patient's condition.  Loralie Champagne 08/11/2020 10:08 AM

## 2020-08-18 NOTE — Progress Notes (Signed)
SLP Cancellation Note  Patient Details Name: Danny Carpenter MRN: [id number] DOB: 12/08/1950   Cancelled treatment:       Reason Eval/Treat Not Completed: Patient not medically ready - sedated on vent s/p Impella removal earlier today. Pt's mentation currently remains a barrier to attempting inline PMV, but will continue to follow for when he may be ready.    Osie Bond., M.A. [facility] Pager [phone] Office [phone]  08/01/2020, 12:10 PM

## 2020-08-18 NOTE — Progress Notes (Signed)
ANTICOAGULATION CONSULT NOTE  Pharmacy Consult for Heparin Indication: stroke  No Known Allergies  Patient Measurements: Height: '6\' 1"'$  (185.4 cm) Weight: 108.9 kg (240 lb 1.3 oz) IBW/kg (Calculated) : 79.9 Heparin Dosing Weight: 102.8 kg  Vital Signs: Temp: 100.76 F (38.2 C) (05/20 2000) Temp Source: Bladder (05/20 1600) BP: 135/59 (05/20 2000) Pulse Rate: 81 (05/20 2000)  Labs: Recent Labs    08/16/20 0429 08/16/20 1327 08/16/20 1736 08/17/20 0412 08/17/20 1552 08/04/2020 0528 08/17/2020 2008  HGB 9.0*  --   --  8.5*  --  8.3*  --   HCT 29.2*  --   --  27.2*  --  26.5*  --   PLT 255  --   --  270  --  312  --   LABPROT  --  15.4*  --   --   --   --   --   INR  --  1.2  --   --   --   --   --   HEPARINUNFRC 0.37  --    < > 0.15* 0.14* 0.11* <0.10*  CREATININE 3.98*  --    < > 3.75* 3.76* 3.68*  --    < > = values in this interval not displayed.    Estimated Creatinine Clearance: 24.9 mL/min (A) (by C-G formula based on SCr of 3.68 mg/dL (H)).   Assessment: 70 yo M presents with NSTEMI and multivessel CAD, now s/p CABG with Impella support. Pharmacy asked to manage systemic heparin. Heparin was held following IR guided drain placement and this morning had removal of Impella 5.5. Anticoagulation to be continued for stroke.   Heparin level this evening came back undetectable, on 1300 units/hr. Hgb 8.3, plt 312, LDH 259 this morning. No s/sx of bleeding or infusion issues per conversation with nursing.   Goal of Therapy:  Heparin level 0.3-0.5 units/ml Monitor platelets by anticoagulation protocol: Yes   Plan:  Increase heparin infusion to 1500 units / hr  Order heparin level in 8 hours  F/u s/sx bleeding, CBC, HL daily  Antonietta Jewel, PharmD, [place] Clinical Pharmacist  Phone: [phone] 08/17/2020 8:44 PM  Please check AMION for all [place] phone numbers After 10:00 PM, call [place] [phone]

## 2020-08-18 NOTE — Op Note (Signed)
Procedure(s): REMOVAL OF IMPELLA LEFT VENTRICULAR ASSIST DEVICE TRANSESOPHAGEAL ECHOCARDIOGRAM (TEE) Procedure Note  Danny Carpenter male 70 y.o. 08/28/2020  Procedure(s) and Anesthesia Type:    * REMOVAL OF IMPELLA LEFT VENTRICULAR ASSIST DEVICE - General    * TRANSESOPHAGEAL ECHOCARDIOGRAM (TEE) - General  Surgeon(s) and Role:    * Wonda Olds, MD - Primary   Indications: The patient is status post CABG several weeks ago which was supported hemodynamically with a right axillary 5.5 Impella LVAD.  He is improved to the point where this is considered removable.  Is taken the operating room after informed consent was obtained from the family.     Surgeon: Wonda Olds   Assistants: StaffGeneral endotracheal anesthesia  Anesthesia: General endotracheal anesthesia  ASA Class: 4    Procedure Detail  REMOVAL OF IMPELLA LEFT VENTRICULAR ASSIST DEVICE, TRANSESOPHAGEAL ECHOCARDIOGRAM (TEE) Patient is going to the operating room on the above date.  He was placed in the supine position.  Anesthesia was confirmed to be adequate.  The anterior chest was cleansed and draped with Betadine paint and soap.  Preop surgical pause was performed.  The prior right axillary incision was reopened sharply.  TEE was performed to evaluate the heart.  This portion of the procedure.  The Impella was turned off and withdrawn into the aorta.  The device was then removed through the chimney graft sewn to the axillary artery.  The graft was itself back flushed.  The stump of the graft is not oversewn with 3-0 Prolene.  The wound was copiously irrigated.  A 10 French drain was left in place.  The wound was then closed in layers.  Sterile dressing applied.  All sponge instruments and needle counts were correct and I was present for all aspects. Estimated Blood Loss:  less than 50 mL         Drains: JACKSON-PRATT (JP)   Blood Given: none          Specimens: Stump of graft for culture         Implants:  none        Complications:  * No complications entered in OR log *         Disposition: ICU - intubated and hemodynamically stable.         Condition: stable

## 2020-08-18 NOTE — Progress Notes (Signed)
Patient ID: Danny Carpenter, male   DOB: [birth year], 70 y.o.   MRN: [id number]  TCTS Evening Rounds:   Hemodynamically stable on milrinone 0.125 and epi 1 after Impella removal this am. Impella site looks dry with minimal JP output.  Remains on vent.  Urine output good    CBC    Component Value Date/Time   WBC 9.0 08/20/2020 0528   RBC 2.85 (L) 08/17/2020 0528   HGB 8.3 (L) 08/04/2020 0528   HCT 26.5 (L) 08/19/2020 0528   PLT 312 08/28/2020 0528   MCV 93.0 08/19/2020 0528   MCH 29.1 08/15/2020 0528   MCHC 31.3 08/13/2020 0528   RDW 19.0 (H) 08/07/2020 0528   LYMPHSABS 1.2 08/13/2020 1526   MONOABS 0.9 08/13/2020 1526   EOSABS 0.5 08/13/2020 1526   BASOSABS 0.1 08/13/2020 1526     BMET    Component Value Date/Time   NA 141 08/07/2020 0528   K 4.1 08/07/2020 0528   CL 107 07/31/2020 0528   CO2 22 08/02/2020 0528   GLUCOSE 141 (H) 08/27/2020 0528   BUN 94 (H) 08/17/2020 0528   CREATININE 3.68 (H) 08/20/2020 0528   CALCIUM 7.9 (L) 08/25/2020 0528   GFRNONAA 17 (L) 08/10/2020 0528   GFRAA >60 08/08/2019 0352     A/P:  Stable after Impella removal.

## 2020-08-18 NOTE — Anesthesia Postprocedure Evaluation (Signed)
Anesthesia Post Note  Patient: Danny Carpenter  Procedure(s) Performed: REMOVAL OF IMPELLA LEFT VENTRICULAR ASSIST DEVICE (N/A ) TRANSESOPHAGEAL ECHOCARDIOGRAM (TEE) (N/A )     Patient location during evaluation: SICU Anesthesia Type: General Level of consciousness: sedated and patient remains intubated per anesthesia plan Pain management: pain level controlled Vital Signs Assessment: post-procedure vital signs reviewed and stable Respiratory status: patient on ventilator - see flowsheet for VS (trach) Cardiovascular status: stable (stable on Milrinone and Epi infusions) Postop Assessment: no apparent nausea or vomiting Anesthetic complications: no   No complications documented.  Last Vitals:  Vitals:   08/11/2020 1700 08/28/2020 1800  BP: 115/76 (!) 142/59  Pulse: 82 87  Resp: (!) 25 (!) 24  Temp: 37.7 C 37.9 C  SpO2: 100% 99%    Last Pain:  Vitals:   07/30/2020 1600  TempSrc: Bladder  PainSc:                  Smera Guyette,E. Mitch Arquette

## 2020-08-18 NOTE — Progress Notes (Signed)
NAMEEmmery Lavan, MRN:  [id number], DOB:  [birth year], LOS: 32 ADMISSION DATE:  07/14/2020, CONSULTATION DATE: 07/28/2018 REFERRING MD: Dr. Orvan Seen, CHIEF COMPLAINT: Ventilator dependent  History of Present Illness:  Danny Carpenter is a 70 year old male with past medical history significant for hypertension, hyperlipidemia, type 2 diabetes, CVA, AAA, left foot amputation s/p fem pop bypass and heart failure who presented to the emergency department 4/21 after undergoing outpatient stress test that revealed EF of 20% and acute ischemia.  Since admission patient has been evaluated by cardiology and cardiothoracic surgery and patient underwent TEE and left heart cath.  LHC/RHC revealed severe three-vessel coronary artery disease with moderately elevated left heart and pulmonary artery pressures.  At that time patient was placed on goal-directed medical therapy for acute systolic congestive heart failure and CTS surgery was consulted for CABG potential.  Patient underwent CABG x5 with Dr. Orvan Seen 4/26, post CABG CODE BLUE called due to low flow on Impella, CTS notified.  Patient underwent bedside exploratory postoperative open heart where Dr. Julien Girt reopened the chest incision and hematoma was removed, patient's hemodynamics improved upon opening sternotomy.  PCCM consulted morning of 4/28 due to difficulty weaning ventilator  Pertinent  Medical History  Hypertension, hyperlipidemia, type 2 diabetes, CVA, AAA, left foot amputation s/p fem pop bypass and heart failure   Significant Hospital Events: Including procedures, antibiotic start and stop dates in addition to other pertinent events   . 4/21 presented to the ED for evaluation of abnormal stress test . 4/22 left and right heart cath severe multivessel disease . 4/26 CABG x5 with hemodynamic instability post requiring brief CPR with eventual return back to the OR anastomosis bleeding was seen . 4/28 remains ventilator dependent, slowly weaning  PEEP . 5/1 remains on vent, diuresing, no ready to come off  . 5/3 started on CRRT. . 5/5 requiring sedation for vent  . 5/6 CT scan shows multiple areas of posterior circulation infarction. . 5/8 tolerating CRRT with fluid removal.  Decreasing vasopressor requirements. . 5/12 TCT today . 5/13 VT event, self converted, febrile . 5/14 spent ~30 mins in Torsades until paced out, code status reversed by Dr. Julien Girt, patient spent 30 mins with no pulsatility and no flow except for the impella.  . 5/16 starting eraxis for persistent fevers . 5/17 remains febrile; planning to pan-CT . 5/18 perc bili drain placed for acalculous cholecystitis. Eraxis stopped by ID. Marland Kitchen 5/19 fentanyl turned off. . 5/20 Impella removed  Interim History / Subjective:   Impella removed yesterday.  Continues on heparin and dobutamine  Objective   Blood pressure 140/68, pulse 81, temperature 100.22 F (37.9 C), resp. rate (!) 23, height '6\' 1"'$  (1.854 m), weight 108.9 kg, SpO2 99 %. CVP:  [9 mmHg] 9 mmHg  Vent Mode: PRVC FiO2 (%):  [40 %] 40 % Set Rate:  [20 bmp] 20 bmp Vt Set:  [630 mL] 630 mL PEEP:  [5 cmH20] 5 cmH20 Pressure Support:  [5 cmH20] 5 cmH20 Plateau Pressure:  [15 cmH20-22 cmH20] 22 cmH20   Intake/Output Summary (Last 24 hours) at 08/21/2020 1033 Last data filed at 08/20/2020 [id number] Gross per 24 hour  Intake 2015.91 ml  Output 3335 ml  Net -1319.09 ml   Filed Weights   08/16/20 0500 08/17/20 0500 08/20/2020 0500  Weight: 114.3 kg 114.1 kg 108.9 kg    Examination: Gen:      No acute distress HEENT:  EOMI, sclera anicteric Neck:     No masses; no thyromegaly, trach  Lungs:    Clear to auscultation bilaterally; normal respiratory effort CV:         Regular rate and rhythm; no murmurs Abd:      + bowel sounds; soft, non-tender; no palpable masses, no distension Ext:    No edema; adequate peripheral perfusion Skin:      Warm and dry; no rash Neuro: Sedated, unresponsive  Labs/imaging that I have  personally reviewed    5/16 blood cultures> NGTD 5/14 blood cx> NGTD Biliary fluid culture 5/18> abundant WBC 5/16 & 5/14 blood cx> NGTD  Creatinine 3.68, AST 45, 48 CBC 9, hemoglobin 8.3  No new imaging  Resolved Hospital Problem list     Assessment & Plan:   Acute hypoxic vent- dependent  respiratory failure  Tracheostomy dependent R pleural effusion, mild pulmonary edema  Plan: Vent weans as tolerated Intermittent chest x-ray Routine trach care  Cardiogenic shock due to ischemic cardiomyopathy CAD s/p CABG x5 Impella removed Continue inotropes per pharmacy  Acute Encephalopathy: multifactorial in setting of critical illness. Prolonged icu stay, posterior circulation CVA. Concern for anoxic brain injury during low flow state -Unable to get brain MRI due to pacing wires for further evaluation. Head CT today. Monitor neuro status  Ongoing fevers- improving - HD catheter and Foley exchanged previously. Con't dialysis catheter as he is still at risk of requiring CRRT --con't biliary drain.  He was previously on Eraxis.  On Zosyn.  Follow cultures  AKI Oliguria improved Monitor urine output and creatinine  Colonic ileus, resolved -Increasing TF towards goal, monitor for intolerance. Acute blood loss anemia due to operative blood loss and critical illness Thrombocytopenia -resolved Follow CBC  AAA -Supportive care; not a candidate for intervention  Hyperglycemia- controlled.  History of poorly controlled diabetes PTA. Levemir, SSI coverage  PAD s/p partial L foot amputation  -monitor peripheral circulation-- has been stable this week -Aspirin and statin daily  Best practice   Diet:  Tube Feed  - on hold for procedures Pain/Anxiety/Delirium protocol (if indicated): Yes (RASS goal 0)  VAP protocol (if indicated): Yes DVT prophylaxis: Systemic AC - Heparin  GI prophylaxis: PPI   Glucose control:  SSI Yes - SSI + basal  Central venous access:  Yes, and it is  still needed, Arterial line:  Yes, and it is still needed  Foley:  Yes, and it is still needed  Mobility:  bed rest  PT consulted: N/A Last date of multidisciplinary goals of care discussion: ongoing  Code Status:  full code Disposition: ICU  The patient is critically ill with multiple organ system failure and requires high complexity decision making for assessment and support, frequent evaluation and titration of therapies, advanced monitoring, review of radiographic studies and interpretation of complex data.   Critical Care Time devoted to patient care services, exclusive of separately billable procedures, described in this note is 35 minutes.   Marshell Garfinkel MD  Pulmonary & Critical care See Amion for pager  If no response to pager , please call [phone] until 7pm After 7:00 pm call [place]  [phone] 08/06/2020, 10:33 AM

## 2020-08-18 NOTE — Plan of Care (Signed)
  Problem: Respiratory: Goal: Ability to maintain a clear airway and adequate ventilation will improve Outcome: Progressing   Problem: Respiratory: Goal: Respiratory status will improve Outcome: Progressing   Problem: Cardiac: Goal: Will achieve and/or maintain hemodynamic stability Outcome: Progressing   Problem: Nutrition: Goal: Adequate nutrition will be maintained Outcome: Progressing   Problem: Elimination: Goal: Will not experience complications related to bowel motility Outcome: Progressing

## 2020-08-19 DIAGNOSIS — R7401 Elevation of levels of liver transaminase levels: Secondary | ICD-10-CM | POA: Diagnosis not present

## 2020-08-19 DIAGNOSIS — R570 Cardiogenic shock: Secondary | ICD-10-CM | POA: Diagnosis not present

## 2020-08-19 DIAGNOSIS — N179 Acute kidney failure, unspecified: Secondary | ICD-10-CM | POA: Diagnosis not present

## 2020-08-19 DIAGNOSIS — K810 Acute cholecystitis: Secondary | ICD-10-CM | POA: Diagnosis not present

## 2020-08-19 DIAGNOSIS — R509 Fever, unspecified: Secondary | ICD-10-CM | POA: Diagnosis not present

## 2020-08-19 LAB — HEPATIC FUNCTION PANEL
ALT: 31 U/L (ref 0–44)
AST: 33 U/L (ref 15–41)
Albumin: 1.7 g/dL — ABNORMAL LOW (ref 3.5–5.0)
Alkaline Phosphatase: 305 U/L — ABNORMAL HIGH (ref 38–126)
Bilirubin, Direct: 0.4 mg/dL — ABNORMAL HIGH (ref 0.0–0.2)
Indirect Bilirubin: 0.6 mg/dL (ref 0.3–0.9)
Total Bilirubin: 1 mg/dL (ref 0.3–1.2)
Total Protein: 5.9 g/dL — ABNORMAL LOW (ref 6.5–8.1)

## 2020-08-19 LAB — CBC
HCT: 22.9 % — ABNORMAL LOW (ref 39.0–52.0)
Hemoglobin: 7 g/dL — ABNORMAL LOW (ref 13.0–17.0)
MCH: 29.2 pg (ref 26.0–34.0)
MCHC: 30.6 g/dL (ref 30.0–36.0)
MCV: 95.4 fL (ref 80.0–100.0)
Platelets: 323 10*3/uL (ref 150–400)
RBC: 2.4 MIL/uL — ABNORMAL LOW (ref 4.22–5.81)
RDW: 18.8 % — ABNORMAL HIGH (ref 11.5–15.5)
WBC: 7.9 10*3/uL (ref 4.0–10.5)
nRBC: 0 % (ref 0.0–0.2)

## 2020-08-19 LAB — POCT I-STAT 7, (LYTES, BLD GAS, ICA,H+H)
Acid-Base Excess: 1 mmol/L (ref 0.0–2.0)
Bicarbonate: 24.7 mmol/L (ref 20.0–28.0)
Calcium, Ion: 1.05 mmol/L — ABNORMAL LOW (ref 1.15–1.40)
HCT: 20 % — ABNORMAL LOW (ref 39.0–52.0)
Hemoglobin: 6.8 g/dL — CL (ref 13.0–17.0)
O2 Saturation: 99 %
Patient temperature: 38
Potassium: 3.7 mmol/L (ref 3.5–5.1)
Sodium: 146 mmol/L — ABNORMAL HIGH (ref 135–145)
TCO2: 26 mmol/L (ref 22–32)
pCO2 arterial: 37.9 mmHg (ref 32.0–48.0)
pH, Arterial: 7.425 (ref 7.350–7.450)
pO2, Arterial: 118 mmHg — ABNORMAL HIGH (ref 83.0–108.0)

## 2020-08-19 LAB — GLUCOSE, CAPILLARY
Glucose-Capillary: 159 mg/dL — ABNORMAL HIGH (ref 70–99)
Glucose-Capillary: 231 mg/dL — ABNORMAL HIGH (ref 70–99)
Glucose-Capillary: 232 mg/dL — ABNORMAL HIGH (ref 70–99)
Glucose-Capillary: 248 mg/dL — ABNORMAL HIGH (ref 70–99)
Glucose-Capillary: 251 mg/dL — ABNORMAL HIGH (ref 70–99)
Glucose-Capillary: 271 mg/dL — ABNORMAL HIGH (ref 70–99)
Glucose-Capillary: 274 mg/dL — ABNORMAL HIGH (ref 70–99)

## 2020-08-19 LAB — BASIC METABOLIC PANEL
Anion gap: 12 (ref 5–15)
BUN: 93 mg/dL — ABNORMAL HIGH (ref 8–23)
CO2: 23 mmol/L (ref 22–32)
Calcium: 7.7 mg/dL — ABNORMAL LOW (ref 8.9–10.3)
Chloride: 109 mmol/L (ref 98–111)
Creatinine, Ser: 3.52 mg/dL — ABNORMAL HIGH (ref 0.61–1.24)
GFR, Estimated: 18 mL/min — ABNORMAL LOW (ref >60)
Glucose, Bld: 253 mg/dL — ABNORMAL HIGH (ref 70–99)
Potassium: 3.7 mmol/L (ref 3.5–5.1)
Sodium: 144 mmol/L (ref 135–145)

## 2020-08-19 LAB — PREPARE RBC (CROSSMATCH)

## 2020-08-19 LAB — COOXEMETRY PANEL
Carboxyhemoglobin: 1.7 % — ABNORMAL HIGH (ref 0.5–1.5)
Methemoglobin: 0.7 % (ref 0.0–1.5)
O2 Saturation: 79.3 %
Total hemoglobin: 6.6 g/dL — CL (ref 12.0–16.0)

## 2020-08-19 LAB — LACTATE DEHYDROGENASE: LDH: 234 U/L — ABNORMAL HIGH (ref 98–192)

## 2020-08-19 LAB — HEPARIN LEVEL (UNFRACTIONATED)
Heparin Unfractionated: <0.1 IU/mL — ABNORMAL LOW (ref 0.30–0.70)
Heparin Unfractionated: 0.13 IU/mL — ABNORMAL LOW (ref 0.30–0.70)

## 2020-08-19 MED ORDER — INSULIN DETEMIR 100 UNIT/ML ~~LOC~~ SOLN
15.0000 [IU] | Freq: Two times a day (BID) | SUBCUTANEOUS | Status: DC
  Administered 2020-08-19 (×2): 15 [IU] via SUBCUTANEOUS
  Filled 2020-08-19 (×4): qty 0.15

## 2020-08-19 MED ORDER — FUROSEMIDE 10 MG/ML IJ SOLN
60.0000 mg | Freq: Once | INTRAMUSCULAR | Status: AC
  Administered 2020-08-19: 60 mg via INTRAVENOUS
  Filled 2020-08-19: qty 6

## 2020-08-19 MED ORDER — POTASSIUM CHLORIDE 20 MEQ PO PACK
40.0000 meq | PACK | Freq: Once | ORAL | Status: AC
  Administered 2020-08-19: 40 meq via ORAL
  Filled 2020-08-19: qty 2

## 2020-08-19 MED ORDER — SODIUM CHLORIDE 0.9% IV SOLUTION: Freq: Once | INTRAVENOUS | Status: AC

## 2020-08-19 NOTE — Progress Notes (Signed)
NAMETy Danny Carpenter, MRN:  [id number], DOB:  1950-09-08, LOS: 43 ADMISSION DATE:  07/06/2020, CONSULTATION DATE: 07/28/2018 REFERRING MD: Dr. Orvan Seen, CHIEF COMPLAINT: Ventilator dependent  History of Present Illness:  Danny Carpenter is a 70 year old male with past medical history significant for hypertension, hyperlipidemia, type 2 diabetes, CVA, AAA, left foot amputation s/p fem pop bypass and heart failure who presented to the emergency department 4/21 after undergoing outpatient stress test that revealed EF of 20% and acute ischemia.  Since admission patient has been evaluated by cardiology and cardiothoracic surgery and patient underwent TEE and left heart cath.  LHC/RHC revealed severe three-vessel coronary artery disease with moderately elevated left heart and pulmonary artery pressures.  At that time patient was placed on goal-directed medical therapy for acute systolic congestive heart failure and CTS surgery was consulted for CABG potential.  Patient underwent CABG x5 with Dr. Orvan Seen 4/26, post CABG CODE BLUE called due to low flow on Impella, CTS notified.  Patient underwent bedside exploratory postoperative open heart where Dr. Julien Girt reopened the chest incision and hematoma was removed, patient's hemodynamics improved upon opening sternotomy.  PCCM consulted morning of 4/28 due to difficulty weaning ventilator  Pertinent  Medical History  Hypertension, hyperlipidemia, type 2 diabetes, CVA, AAA, left foot amputation s/p fem pop bypass and heart failure   Significant Hospital Events: Including procedures, antibiotic start and stop dates in addition to other pertinent events   . 4/21 presented to the ED for evaluation of abnormal stress test . 4/22 left and right heart cath severe multivessel disease . 4/26 CABG x5 with hemodynamic instability post requiring brief CPR with eventual return back to the OR anastomosis bleeding was seen . 4/28 remains ventilator dependent, slowly weaning  PEEP . 5/1 remains on vent, diuresing, no ready to come off  . 5/3 started on CRRT. . 5/5 requiring sedation for vent  . 5/6 CT scan shows multiple areas of posterior circulation infarction. . 5/8 tolerating CRRT with fluid removal.  Decreasing vasopressor requirements. . 5/12 TCT today . 5/13 VT event, self converted, febrile . 5/14 spent ~30 mins in Torsades until paced out, code status reversed by Dr. Julien Girt, patient spent 30 mins with no pulsatility and no flow except for the impella.  . 5/16 starting eraxis for persistent fevers . 5/17 remains febrile; planning to pan-CT . 5/18 perc bili drain placed for acalculous cholecystitis. Eraxis stopped by ID. Marland Kitchen 5/19 fentanyl turned off. . 5/20 Impella removed  Interim History / Subjective:   Remains critically ill on epinephrine, milrinone drips.  Has adequate urine output  Objective   Blood pressure (!) 145/48, pulse 88, temperature (!) 100.4 F (38 C), resp. rate (!) 22, height '6\' 1"'$  (1.854 m), weight 108.9 kg, SpO2 99 %. CVP:  [9 mmHg] 9 mmHg  Vent Mode: PRVC FiO2 (%):  [40 %] 40 % Set Rate:  [20 bmp] 20 bmp Vt Set:  [630 mL] 630 mL PEEP:  [5 cmH20] 5 cmH20 Plateau Pressure:  [19 cmH20-22 cmH20] 22 cmH20   Intake/Output Summary (Last 24 hours) at 08/19/2020 0904 Last data filed at 08/19/2020 0830 Gross per 24 hour  Intake 1690.06 ml  Output 4100 ml  Net -2409.94 ml   Filed Weights   08/16/20 0500 08/17/20 0500 07/30/2020 0500  Weight: 114.3 kg 114.1 kg 108.9 kg    Examination: Blood pressure (!) 145/48, pulse 88, temperature (!) 100.4 F (38 C), resp. rate (!) 22, height '6\' 1"'$  (1.854 m), weight 108.9 kg, SpO2 99 %.  Gen:      No acute distress, critically ill HEENT:  EOMI, sclera anicteric, trach Neck:     No masses; no thyromegaly Lungs:    Clear to auscultation bilaterally; normal respiratory effort CV:         Regular rate and rhythm; no murmurs Abd:      + bowel sounds; soft, non-tender; no palpable masses, no  distension Ext:    No edema; adequate peripheral perfusion Skin:      Warm and dry; no rash Neuro: Sedated, unresponsive  Labs/imaging that I have personally reviewed    5/16 blood cultures> NGTD 5/14 blood cx> NGTD Biliary fluid culture 5/18> abundant WBC 5/16 & 5/14 blood cx> NGTD  Creatinine 3.52, hemoglobin 7 No new imaging  Resolved Hospital Problem list     Assessment & Plan:   Acute hypoxic vent- dependent  respiratory failure  Tracheostomy dependent R pleural effusion, mild pulmonary edema  Plan: Continue vent support.  Hold off weaning until his hemodynamics are stable Intermittent chest x-ray Routine trach care  Cardiogenic shock due to ischemic cardiomyopathy CAD s/p CABG x5 Impella removed Continue inotropes, epinephrine per cardiothoracic surgery  Acute Encephalopathy: multifactorial in setting of critical illness. Prolonged icu stay, posterior circulation CVA. Concern for anoxic brain injury during low flow state -Unable to get brain MRI due to pacing wires for further evaluation. Monitor neuro status  Ongoing fevers- improving - HD catheter and Foley exchanged previously. Con't dialysis catheter as he is still at risk of requiring CRRT --con't biliary drain.  He was previously on Eraxis.  On Zosyn.  Follow cultures -ID is consulted  AKI Oliguria improved Monitor urine output and creatinine  Colonic ileus, resolved -Increasing TF towards goal, monitor for intolerance. Acute blood loss anemia due to operative blood loss and critical illness Thrombocytopenia -resolved Follow CBC  AAA -Supportive care; not a candidate for intervention  Hyperglycemia- controlled.  History of poorly controlled diabetes PTA. SSI coverage, increase Levemir to 15 units twice a day  PAD s/p partial L foot amputation  -monitor peripheral circulation-- has been stable this week -Aspirin and statin daily  Best practice   Diet:  Tube Feed  - on hold for  procedures Pain/Anxiety/Delirium protocol (if indicated): Yes (RASS goal 0)  VAP protocol (if indicated): Yes DVT prophylaxis: Systemic AC - Heparin  GI prophylaxis: PPI   Glucose control:  SSI Yes - SSI + basal  Central venous access:  Yes, and it is still needed, Arterial line:  Yes, and it is still needed  Foley:  Yes, and it is still needed  Mobility:  bed rest  PT consulted: N/A Last date of multidisciplinary goals of care discussion: ongoing  Code Status:  full code Disposition: ICU  The patient is critically ill with multiple organ system failure and requires high complexity decision making for assessment and support, frequent evaluation and titration of therapies, advanced monitoring, review of radiographic studies and interpretation of complex data.   Critical Care Time devoted to patient care services, exclusive of separately billable procedures, described in this note is 35 minutes.   Marshell Garfinkel MD [facility] See Amion for pager  If no response to pager , please call [phone] until 7pm After 7:00 pm call Elink  [phone] 08/19/2020, 9:04 AM

## 2020-08-19 NOTE — Progress Notes (Signed)
Patient ID: Danny Carpenter, male   DOB: 06-17-1950, 70 y.o.   MRN: [id number]  TCTS Evening Rounds:   Hemodynamically stable   Remains on vent.  Urine output good    CBC    Component Value Date/Time   WBC 7.9 08/19/2020 0450   RBC 2.40 (L) 08/19/2020 0450   HGB 7.0 (L) 08/19/2020 0450   HCT 22.9 (L) 08/19/2020 0450   PLT 323 08/19/2020 0450   MCV 95.4 08/19/2020 0450   MCH 29.2 08/19/2020 0450   MCHC 30.6 08/19/2020 0450   RDW 18.8 (H) 08/19/2020 0450   LYMPHSABS 1.2 08/13/2020 1526   MONOABS 0.9 08/13/2020 1526   EOSABS 0.5 08/13/2020 1526   BASOSABS 0.1 08/13/2020 1526     BMET    Component Value Date/Time   NA 144 08/19/2020 0450   K 3.7 08/19/2020 0450   CL 109 08/19/2020 0450   CO2 23 08/19/2020 0450   GLUCOSE 253 (H) 08/19/2020 0450   BUN 93 (H) 08/19/2020 0450   CREATININE 3.52 (H) 08/19/2020 0450   CALCIUM 7.7 (L) 08/19/2020 0450   GFRNONAA 18 (L) 08/19/2020 0450   GFRAA >60 08/08/2019 0352     A/P:  Stable day.

## 2020-08-19 NOTE — Progress Notes (Signed)
Assisted tele visit to patient with family member.  Emiliano Welshans M, RN  

## 2020-08-19 NOTE — Progress Notes (Addendum)
ANTICOAGULATION CONSULT NOTE  Pharmacy Consult for Heparin Indication: stroke  No Known Allergies  Patient Measurements: Height: '6\' 1"'$  (185.4 cm) Weight: 108.9 kg (240 lb 1.3 oz) IBW/kg (Calculated) : 79.9 Heparin Dosing Weight: 102.8 kg  Vital Signs: Temp: 100.4 F (38 C) (05/21 0600) BP: 145/48 (05/21 0025) Pulse Rate: 119 (05/21 0600)  Labs: Recent Labs    08/16/20 1327 08/16/20 1736 08/17/20 0412 08/17/20 1552 08/27/2020 0528 08/26/2020 2008 08/19/20 0447 08/19/20 0450  HGB  --    < > 8.5*  --  8.3*  --  6.8* 7.0*  HCT  --    < > 27.2*  --  26.5*  --  20.0* 22.9*  PLT  --   --  270  --  312  --   --  323  LABPROT 15.4*  --   --   --   --   --   --   --   INR 1.2  --   --   --   --   --   --   --   HEPARINUNFRC  --    < > 0.15* 0.14* 0.11* <0.10*  --  <0.10*  CREATININE  --    < > 3.75* 3.76* 3.68*  --   --  3.52*   < > = values in this interval not displayed.    Estimated Creatinine Clearance: 26 mL/min (A) (by C-G formula based on SCr of 3.52 mg/dL (H)).   Assessment: 70 yo M presents with NSTEMI and multivessel CAD, now s/p CABG with Impella support. Pharmacy asked to manage systemic heparin. Heparin was held following IR guided drain placement and this morning had removal of Impella 5.5. Anticoagulation to be continued for stroke.   Heparin level last evening remains undetectable after heparin rate increased to 1500 units/hr last PM.  Hgb down from 8.3 to 7.0 , plt 323, LDH 234 No bleeding reported.   Goal of Therapy:  Heparin level 0.3-0.5 units/ml Monitor platelets by anticoagulation protocol: Yes   Plan:  Increase heparin infusion to 1700 units / hr  Order heparin level in 6 hours  F/u s/sx bleeding, CBC, HL daily   Nicole Cella, RPh Clinical Pharmacist Please check AMION for all [place] phone numbers After 10:00 PM, call [place] [phone] 08/19/2020 6:47 AM  Please check AMION for all [place] phone numbers After 10:00 PM, call [place] [phone]

## 2020-08-19 NOTE — Progress Notes (Signed)
Patient ID: Danny Carpenter, male   DOB: [birth year], 70 y.o.   MRN: [id number]     Advanced Heart Failure Rounding Note  PCP-Cardiologist: None   Subjective:    - 4/26 S/P CABG - 5/4 Swan removed with ongoing fevers, CVVH started - CT head on 5/6 with multiple posterior circulation infarcts.  - 5/10 tracheostomy. CT head similar to prior with multiple small infarcts.  - 5/14 Torsades -> paced out by Dr. Orvan Seen. Impella turned back up from P3 -> P7  after code. Amio gtt switched to lidocaine.   - 5/16 Eraxis added for persistent fevers. Cultures resent>>pending  - 5/17 CT chest/abdomen/pelvis and abdominal US suggestive of acalculous cholecystitis - 5/18 cholecystostomy tube - 5/20 Return to the OR today for Impella removal. TEE with EF 30%-->started on epi 2 mcg with improved EF to 50%.   Continues on mexiletine, in AF but no VT.   CVP 10-11 this morning, I/O net negative 1670 yesterday with 1 dose IV Lasix.  Co-ox 79%, now on milrinone 0.125 and epinephrine1.   Tm 100.7, remains on Zosyn.  Has biliary drain. WBCs 7.9.  Hgb down to 7.    Objective:   Weight Range: 108.9 kg Body mass index is 31.67 kg/m.   Vital Signs:   Temp:  [99.14 F (37.3 C)-100.76 F (38.2 C)] 100.4 F (38 C) (05/21 0800) Pulse Rate:  [73-119] 88 (05/21 0800) Resp:  [13-32] 22 (05/21 0800) BP: (97-146)/(48-76) 145/48 (05/21 0025) SpO2:  [95 %-100 %] 99 % (05/21 0805) Arterial Line BP: (101-164)/(39-52) 140/48 (05/21 0800) FiO2 (%):  [40 %] 40 % (05/21 0805) Last BM Date: 08/07/2020  Weight change: Filed Weights   08/16/20 0500 08/17/20 0500 08/07/2020 0500  Weight: 114.3 kg 114.1 kg 108.9 kg    Intake/Output:   Intake/Output Summary (Last 24 hours) at 08/19/2020 0910 Last data filed at 08/19/2020 0830 Gross per 24 hour  Intake 1690.06 ml  Output 4100 ml  Net -2409.94 ml      Physical Exam  CVP 10-11 General: on vent via trach Neck: Trach, JVP 10 cm, no thyromegaly or thyroid nodule.   Lungs: Decreased at bases. CV: Sternotomy.  Heart irregular S1/S2, no S3/S4, no murmur.  1+ ankle edema.  Abdomen: Soft, nontender, no hepatosplenomegaly, no distention.  Skin: Intact without lesions or rashes.  Neurologic: Opens eyes, withdraws from pain Extremities: No clubbing or cyanosis.  HEENT: Normal.    Telemetry    A Fib 70s (personally reviewed)  Labs    CBC Recent Labs    08/04/2020 0528 08/19/20 0447 08/19/20 0450  WBC 9.0  --  7.9  HGB 8.3* 6.8* 7.0*  HCT 26.5* 20.0* 22.9*  MCV 93.0  --  95.4  PLT 312  --  110   Basic Metabolic Panel Recent Labs    08/17/20 0412 08/17/20 1552 08/05/2020 0528 08/19/20 0447 08/19/20 0450  NA 140 143 141 146* 144  K 3.7 4.4 4.1 3.7 3.7  CL 105 107 107  --  109  CO2 _0 --  23  GLUCOSE 190* 164* 141*  --  253*  BUN 91* 93* 94*  --  93*  CREATININE 3.75* 3.76* 3.68*  --  3.52*  CALCIUM 7.7* 7.7* 7.9*  --  7.7*  MG 2.4 2.3  --   --   --    Liver Function Tests Recent Labs    08/12/2020 0528 08/19/20 0450  AST 45* 33  ALT 48* 31  ALKPHOS 418*  305*  BILITOT 1.2 1.0  PROT 6.2* 5.9*  ALBUMIN 1.9* 1.7*   No results for input(s): LIPASE, AMYLASE in the last 72 hours. Cardiac Enzymes No results for input(s): CKTOTAL, CKMB, CKMBINDEX, TROPONINI in the last 72 hours.  BNP: BNP (last 3 results) Recent Labs    07/27/2020 1211  BNP 465.5*    ProBNP (last 3 results) No results for input(s): PROBNP in the last 8760 hours.   D-Dimer No results for input(s): DDIMER in the last 72 hours. Hemoglobin A1C No results for input(s): HGBA1C in the last 72 hours. Fasting Lipid Panel No results for input(s): CHOL, HDL, LDLCALC, TRIG, CHOLHDL, LDLDIRECT in the last 72 hours. Thyroid Function Tests No results for input(s): TSH, T4TOTAL, T3FREE, THYROIDAB in the last 72 hours.  Invalid input(s): FREET3  Other results:   Imaging    No results found.   Medications:     Scheduled Medications: . aspirin EC  325  mg Oral Daily   Or  . aspirin  324 mg Per Tube Daily  . chlorhexidine gluconate (MEDLINE KIT)  15 mL Mouth Rinse BID  . Chlorhexidine Gluconate Cloth  6 each Topical Daily  . darbepoetin (ARANESP) injection - NON-DIALYSIS  150 mcg Subcutaneous Q Wed-1800  . feeding supplement (PROSource TF)  45 mL Per Tube 5 X Daily  . ferrous [id number] C-folic acid  1 capsule Oral BID PC  . fiber  1 packet Per Tube BID  . furosemide  60 mg Intravenous Once  . Gerhardt's butt cream   Topical QID  . insulin aspart  0-15 Units Subcutaneous Q4H  . insulin detemir  15 Units Subcutaneous BID  . levalbuterol  0.63 mg Nebulization TID  . mouth rinse  15 mL Mouth Rinse 10 times per day  . mexiletine  200 mg Per Tube Q12H  . midodrine  10 mg Per Tube TID WC  . pantoprazole sodium  40 mg Per Tube Daily  . potassium chloride  40 mEq Oral Once  . rosuvastatin  10 mg Per Tube Daily  . sodium chloride flush  10-40 mL Intracatheter Q12H  . sodium chloride flush  5 mL Intracatheter Q8H    Infusions: . sodium chloride Stopped (08/19/2020 0734)  . sodium chloride    . sodium chloride    . sodium chloride 20 mL/hr at 08/19/20 0700  . dextrose Stopped (08/17/20 1116)  . epinephrine 1 mcg/min (08/19/20 0700)  . feeding supplement (VITAL 1.5 CAL) 55 mL/hr at 08/19/20 0600  . heparin 1,500 Units/hr (08/19/20 0700)  . milrinone 0.125 mcg/kg/min (08/19/20 0700)  . piperacillin-tazobactam (ZOSYN)  IV 3.375 g (08/19/20 0823)    PRN Medications: sodium chloride, Place/Maintain arterial line **AND** sodium chloride, acetaminophen (TYLENOL) oral liquid 160 mg/5 mL, fentaNYL (SUBLIMAZE) injection, polyvinyl alcohol, sodium chloride flush  Assessment/Plan   1. Cardiogenic shock: Ischemic cardiomyopathy, post-CABG on 4/26.  He has Impella 5.5 in place, down to P4 with no alarms and stable LDH.  Limited echo 4/29 with EF< 20%, the RV appears normal in size with severe systolic dysfunction.  Echo with some  improvement, EF in 30-35% range on 5/9 echo. Suspect component of septic/distributive shock. Impella out 5/20. EF 30% intraop TEE 5/20.  Today, on milrinone 0.125 and epinephrine 1, CVP 10 with co-ox 79%.  - Stop epinephrine.  - Continue milrinone 0.125.  - Continue midodrine.  - Lasix 60 mg IV x 1 today.  2. CAD: s/p CABG x 5 with LIMA-LAD, seq SVG-D1/ramus, seq SVG-PDA/PLV.   -  ASA  - Crestor 3. Anemia: Post-op bleeding, back to OR with multiple products given on 4/26 post-CABG.  Hgb 7, no overt bleeding.  - Will give 1 unit today.  4. Thrombocytopenia: Resolved. suspect low post-op/post-surgical bleeding and multiple blood products as well as sepsis.  5. PAD: Extensive history.  6. AAA: Monitoring as outpatient. 4.5 cm on CT.  7. Type 2 DM: Insulin.  Hgb A1C 11.6, poor control.  8. Atrial fibrillation: Rate controlled.  Off amio with torsades and possible QT prolongation.  - Heparin gtt.  9. Neuro: intubated and sedated, not following commands with sedation wean.  Head CT 5/6 with multiple posterior circulation infarcts. Head CT 5/10 no change.  Awake and withdraws from pain. NH3 was not elevated.  - Neuro has seen.  - Palliative Care following.  - Eventually needs MRI head.  10. ID:  PCT 7.5 on 4/29. Cultures so far negative.  Now with cholecystostomy drain for acalculous cholecystitis. Off Eraxis now and on Zosyn for biliary coverage. ID following.  Persistent fevers, Tm 100.7, WBCs 7.9.  Lines all have been changed out.  - Continue Zosyn.  - ?Neurogenic fevers - ID following.  11. Acute hypoxemic respiratory failure: S/p tracheostomy on 5/10.  - trach management per PCCM  12. AKI:  CVVH started with marked volume overload and worsening renal function.  Now off CVVH with good UOP and creatinine gradually decreasing.  - Lasix 60 mg IV x 1 today.  13. Ileus: Improved 14. Torsades: Paced out by Dr Orvan Seen on 5/14. Amiodarone stopped.  - continue Mexiletine  15. GOC: was limited code  and now changed back to Full Code 16. Acute acalculous cholecystitis: S/p biliary drain placement.   - Zosyn for biliary coverage.   CRITICAL CARE Performed by: Loralie Champagne  Total critical care time: 35 minutes  Critical care time was exclusive of separately billable procedures and treating other patients.  Critical care was necessary to treat or prevent imminent or life-threatening deterioration.  Critical care was time spent personally by me on the following activities: development of treatment plan with patient and/or surrogate as well as nursing, discussions with consultants, evaluation of patient's response to treatment, examination of patient, obtaining history from patient or surrogate, ordering and performing treatments and interventions, ordering and review of laboratory studies, ordering and review of radiographic studies, pulse oximetry and re-evaluation of patient's condition.  Loralie Champagne 08/19/2020 9:10 AM

## 2020-08-19 NOTE — Progress Notes (Signed)
1 Day Post-Op Procedure(s) (LRB): REMOVAL OF IMPELLA LEFT VENTRICULAR ASSIST DEVICE (N/A) TRANSESOPHAGEAL ECHOCARDIOGRAM (TEE) (N/A) Subjective: trached on vent.  Objective: Vital signs in last 24 hours: Temp:  [99.14 F (37.3 C)-100.76 F (38.2 C)] 100.4 F (38 C) (05/21 0800) Pulse Rate:  [73-119] 88 (05/21 0800) Cardiac Rhythm: Atrial fibrillation (05/20 2000) Resp:  [13-32] 22 (05/21 0800) BP: (97-146)/(48-76) 145/48 (05/21 0025) SpO2:  [95 %-100 %] 99 % (05/21 0805) Arterial Line BP: (101-164)/(39-52) 140/48 (05/21 0800) FiO2 (%):  [40 %] 40 % (05/21 0805)  Hemodynamic parameters for last 24 hours: CVP:  [9 mmHg] 9 mmHg  Intake/Output from previous day: 05/20 0701 - 05/21 0700 In: 2040.1 [I.V.:1181.4; NG/GT:722.8; IV Piggyback:99.9] Out: 3710 [Urine:3000; Drains:460; Stool:250] Intake/Output this shift: Total I/O In: -  Out: 540 [Urine:300; Emesis/NG output:40; Drains:200]  General appearance: trached on vent Neurologic: opens eyes to voice, does not follow commands Heart: regular rate and rhythm, S1, S2 normal, no murmur Lungs: clear to auscultation bilaterally Abdomen: soft, non-tender; bowel sounds normal Extremities: moderate anasarca Wound: chest incision well-healed No JP output from Impella site.  Lab Results: Recent Labs    08/03/2020 0528 08/19/20 0447 08/19/20 0450  WBC 9.0  --  7.9  HGB 8.3* 6.8* 7.0*  HCT 26.5* 20.0* 22.9*  PLT 312  --  323   BMET:  Recent Labs    08/25/2020 0528 08/19/20 0447 08/19/20 0450  NA 141 146* 144  K 4.1 3.7 3.7  CL 107  --  109  CO2 22  --  23  GLUCOSE 141*  --  253*  BUN 94*  --  93*  CREATININE 3.68*  --  3.52*  CALCIUM 7.9*  --  7.7*    PT/INR:  Recent Labs    08/16/20 1327  LABPROT 15.4*  INR 1.2   ABG    Component Value Date/Time   PHART 7.425 08/19/2020 0447   HCO3 24.7 08/19/2020 0447   TCO2 26 08/19/2020 0447   ACIDBASEDEF 2.0 08/05/2020 0400   O2SAT 79.3 08/19/2020 0450   CBG (last 3)   Recent Labs    08/19/20 0036 08/19/20 0333 08/19/20 0738  GLUCAP 271* 248* 274*    Assessment/Plan: S/P Procedure(s) (LRB): REMOVAL OF IMPELLA LEFT VENTRICULAR ASSIST DEVICE (N/A) TRANSESOPHAGEAL ECHOCARDIOGRAM (TEE) (N/A)  Hemodynamically stable on milrinone 0.125 and epi 1 after Impella removal yesterday. Epi can be turned off. Continue milrinone.  VDRF: weaning per CCM  AKI: improving. Good urine output and creat decreasing. Receiving lasix today.  Anemia: transfuse 1 unit PRBC's today.  Continues to have low grade fevers with normal WBC ct. Has GB drain for acalculous cholecystitis and Zosyn. ID following.    LOS: 30 days    Danny Carpenter 08/19/2020

## 2020-08-19 NOTE — Progress Notes (Signed)
Admit: 07/17/2020 LOS: 74  78M progressive AKI after CABG with prolonged shock (cardiogenic and ? Vasodilatory/septic), failure to achieve target volume status despite aggressive diuretics.    Catheter: right IJ Temp IJ CCM placed 5/14  S: And epinephrine.  Fevers persistent.  Creatinine improving slowly.  Urine output adequate   O: 05/20 0701 - 05/21 0700 In: 2040.1 [I.V.:1181.4; NG/GT:722.8; IV Piggyback:99.9] Out: 3710 [Urine:3000; Drains:460; Stool:250]  Vitals:   08/19/20 0800 08/19/20 0805  BP:    Pulse: 88   Resp: (!) 22   Temp: (!) 100.4 F (38 C)   SpO2: 99% 99%     PE Eyes/General lying in bed in no distress Lungs-bilateral chest rise, coarse upper airway sounds CV-normal rate, sternotomy scar Abd-no pain with palpation, soft Ext-minimal pitting edema  Filed Weights   08/16/20 0500 08/17/20 0500 08/19/2020 0500  Weight: 114.3 kg 114.1 kg 108.9 kg    Recent Labs  Lab 08/12/20 1521 08/13/20 0326 08/13/20 1526 08/17/20 1552 08/11/2020 0528 08/19/20 0447 08/19/20 0450  NA 138  139  139 140   < > 143 141 146* 144  K 3.8  3.8  3.8 3.3*   < > 4.4 4.1 3.7 3.7  CL 89*  90*  90* 90*   < > 107 107  --  109  CO2 34*  34*  35* 33*   < > 24 22  --  23  GLUCOSE 225*  224*  225* 125*   < > 164* 141*  --  253*  BUN 53*  52*  53* 62*   < > 93* 94*  --  93*  CREATININE 4.00*  3.88*  3.94* 4.33*   < > 3.76* 3.68*  --  3.52*  CALCIUM 8.0*  8.1*  8.1* 8.3*   < > 7.7* 7.9*  --  7.7*  PHOS 5.0* 5.0*  --   --   --   --   --    < > = values in this interval not displayed.   Recent Labs  Lab 08/13/20 1526 08/14/20 0428 08/17/20 0412 07/31/2020 0528 08/19/20 0447 08/19/20 0450  WBC 8.6   < > 8.7 9.0  --  7.9  NEUTROABS 5.9  --   --   --   --   --   HGB 9.2*   < > 8.5* 8.3* 6.8* 7.0*  HCT 28.9*   < > 27.2* 26.5* 20.0* 22.9*  MCV 89.5   < > 93.2 93.0  --  95.4  PLT 303   < > 270 312  --  323   < > = values in this interval not displayed.    Scheduled  Meds: . aspirin EC  325 mg Oral Daily   Or  . aspirin  324 mg Per Tube Daily  . chlorhexidine gluconate (MEDLINE KIT)  15 mL Mouth Rinse BID  . Chlorhexidine Gluconate Cloth  6 each Topical Daily  . darbepoetin (ARANESP) injection - NON-DIALYSIS  150 mcg Subcutaneous Q Wed-1800  . feeding supplement (PROSource TF)  45 mL Per Tube 5 X Daily  . ferrous [id number] C-folic acid  1 capsule Oral BID PC  . fiber  1 packet Per Tube BID  . Gerhardt's butt cream   Topical QID  . insulin aspart  0-15 Units Subcutaneous Q4H  . insulin detemir  12 Units Subcutaneous BID  . levalbuterol  0.63 mg Nebulization TID  . mouth rinse  15 mL Mouth Rinse 10 times per day  . mexiletine  200 mg Per Tube Q12H  . midodrine  10 mg Per Tube TID WC  . pantoprazole sodium  40 mg Per Tube Daily  . rosuvastatin  10 mg Per Tube Daily  . sodium chloride flush  10-40 mL Intracatheter Q12H  . sodium chloride flush  5 mL Intracatheter Q8H   Continuous Infusions: . sodium chloride Stopped (08/21/2020 0734)  . sodium chloride    . sodium chloride    . sodium chloride 20 mL/hr at 08/19/20 0700  . dextrose Stopped (08/17/20 1116)  . epinephrine 1 mcg/min (08/19/20 0700)  . feeding supplement (VITAL 1.5 CAL) 55 mL/hr at 08/19/20 0600  . heparin 1,500 Units/hr (08/19/20 0700)  . milrinone 0.125 mcg/kg/min (08/19/20 0700)  . piperacillin-tazobactam (ZOSYN)  IV 3.375 g (08/19/20 0823)   PRN Meds:.sodium chloride, Place/Maintain arterial line **AND** sodium chloride, acetaminophen (TYLENOL) oral liquid 160 mg/5 mL, fentaNYL (SUBLIMAZE) injection, polyvinyl alcohol, sodium chloride flush  ABG    Component Value Date/Time   PHART 7.425 08/19/2020 0447   PCO2ART 37.9 08/19/2020 0447   PO2ART 118 (H) 08/19/2020 0447   HCO3 24.7 08/19/2020 0447   TCO2 26 08/19/2020 0447   ACIDBASEDEF 2.0 08/05/2020 0400   O2SAT 79.3 08/19/2020 0450    A/P  1. Dialysis dependent AKI  secondary to  cardiorenal syndrome probably  some ATN s/p CABG.  Required CRRT from 5/3 to 5/13.  BL Crt <2.  Renal parameters including creatinine slowly improving. Likely multifactorial but overall stable/reassuring trend. Volume status acceptable. Would remove dialysis catheter PRN. No strong indication for RRT and his candidacy is poor so would need extensive [place] conversation before restarting. Hopeful will not need given recover. Given his steady, but slow, recovery we will sign off at this time.  Do not hesitate to contact nephrology for further assistance. 2. Acute systolic CHF / ICM; impella now removed. Currently on milrinone and epinephrine hopefully will continue to wean.  Management per primary.  Urine output adequate.  Could consider Lasix if needed 3. CAD s/p  5V CABG 4/26 4. Anemia-ESA on board-  Transfuse PRN-  Given one unit on 5/12 5. DM2 6. PAD hx/o Fem Pop Bypass 7. VDRF -remains ventilated 8. Numerous ischemic bilateral posterior CVAs, neurology following-- this is the major issue 9. Fevers: Extensive fevers.  Initially improved but now persistent.  Antibiotics and treatment per primary team   agree with goals of care conversation, grim prognosis given his prolonged neurological insult.  Partial code-fortunately has not needed further renal replacement therapy   Reesa Chew, MD  [facility]

## 2020-08-19 NOTE — Progress Notes (Signed)
ANTICOAGULATION CONSULT NOTE  Pharmacy Consult for Heparin Indication: stroke  No Known Allergies  Patient Measurements: Height: '6\' 1"'$  (185.4 cm) Weight: 108.9 kg (240 lb 1.3 oz) IBW/kg (Calculated) : 79.9 Heparin Dosing Weight: 102.8 kg  Vital Signs: Temp: 99.5 F (37.5 C) (05/21 1400) Temp Source: Bladder (05/21 1328) BP: 145/57 (05/21 1400) Pulse Rate: 78 (05/21 1400)  Labs: Recent Labs    08/17/20 0412 08/17/20 1552 08/14/2020 0528 08/29/2020 2008 08/19/20 0447 08/19/20 0450 08/19/20 1322  HGB 8.5*  --  8.3*  --  6.8* 7.0*  --   HCT 27.2*  --  26.5*  --  20.0* 22.9*  --   PLT 270  --  312  --   --  323  --   HEPARINUNFRC 0.15* 0.14* 0.11* <0.10*  --  <0.10* 0.13*  CREATININE 3.75* 3.76* 3.68*  --   --  3.52*  --     Estimated Creatinine Clearance: 26 mL/min (A) (by C-G formula based on SCr of 3.52 mg/dL (H)).   Assessment: 70 yo M presents with NSTEMI and multivessel CAD, now s/p CABG with Impella support. Pharmacy asked to manage systemic heparin. Heparin was held following IR guided drain placement and this morning had removal of Impella 5.5. Anticoagulation to be continued for stroke.   Heparin level last evening remains low after heparin rate increased to 1700 units/hr early this AM.  Hgb down from 8.3 to 7.0 , plt 323, LDH 234 No bleeding reported.   Goal of Therapy:  Heparin level 0.3-0.5 units/ml Monitor platelets by anticoagulation protocol: Yes   Plan:  Increase heparin infusion to 1900 units / hr  Order heparin level in 8 hours  F/u s/sx bleeding, CBC, HL daily   [place] Crane, Roylene Reason, [facility] Clinical Pharmacist  08/19/2020 3:33 PM   [facility] pharmacy phone numbers are listed on [email or web address]

## 2020-08-19 NOTE — Progress Notes (Signed)
[facility] for Infectious Disease   Reason for visit: Follow up on fever  Interval History: no acute events; WBC 7.9, Tmax 100.7.   Piperacillin/tazobactam day 4 Total antibiotics day 12  Physical Exam: Constitutional:  Vitals:   08/19/20 1147 08/19/20 1203  BP:    Pulse: 81 75  Resp: (!) 22 (!) 21  Temp: 100.22 F (37.9 C) 100.04 F (37.8 C)  SpO2: 99% 100%  sedated, no response Eyes: anicteric, right eye covered HENT: + trach and vent Respiratory: respiratory effort on vent; + diffuse rhonchi Cardiovascular: Tachy RR GI: soft, nt, nd MS: + boots  Review of Systems: Unable to be assessed due to patient factors  Lab Results  Component Value Date   WBC 7.9 08/19/2020   HGB 7.0 (L) 08/19/2020   HCT 22.9 (L) 08/19/2020   MCV 95.4 08/19/2020   PLT 323 08/19/2020    Lab Results  Component Value Date   CREATININE 3.52 (H) 08/19/2020   BUN 93 (H) 08/19/2020   NA 144 08/19/2020   K 3.7 08/19/2020   CL 109 08/19/2020   CO2 23 08/19/2020    Lab Results  Component Value Date   ALT 31 08/19/2020   AST 33 08/19/2020   ALKPHOS 305 (H) 08/19/2020     Microbiology: Recent Results (from the past 240 hour(s))  Culture, blood (routine x 2)     Status: None   Collection Time: 08/12/20  3:20 PM   Specimen: BLOOD RIGHT HAND  Result Value Ref Range Status   Specimen Description BLOOD RIGHT HAND  Final   Special Requests   Final    BOTTLES DRAWN AEROBIC AND ANAEROBIC Blood Culture adequate volume   Culture   Final    NO GROWTH 5 DAYS Performed at [facility], [address] [place], [place] [address]    Report Status 08/17/2020 FINAL  Final  Culture, blood (routine x 2)     Status: None   Collection Time: 08/12/20  3:20 PM   Specimen: BLOOD LEFT HAND  Result Value Ref Range Status   Specimen Description BLOOD LEFT HAND  Final   Special Requests   Final    BOTTLES DRAWN AEROBIC AND ANAEROBIC Blood Culture adequate volume   Culture   Final    NO  GROWTH 5 DAYS Performed at [facility], [place] [address], [place], [place] [address]    Report Status 08/17/2020 FINAL  Final  Culture, blood (routine x 2)     Status: None (Preliminary result)   Collection Time: 08/14/20  8:55 AM   Specimen: BLOOD  Result Value Ref Range Status   Specimen Description BLOOD RIGHT ANTECUBITAL  Final   Special Requests   Final    BOTTLES DRAWN AEROBIC AND ANAEROBIC Blood Culture adequate volume   Culture   Final    NO GROWTH 4 DAYS Performed at [facility], [place] [address] [place], [place] [address]    Report Status PENDING  Incomplete  Culture, blood (routine x 2)     Status: None (Preliminary result)   Collection Time: 08/14/20  8:55 AM   Specimen: BLOOD  Result Value Ref Range Status   Specimen Description BLOOD LEFT ANTECUBITAL  Final   Special Requests   Final    BOTTLES DRAWN AEROBIC AND ANAEROBIC Blood Culture adequate volume   Culture   Final    NO GROWTH 4 DAYS Performed at [facility], [place],  [place] [address]    Report Status PENDING  Incomplete  Aerobic/Anaerobic Culture w Gram Stain (surgical/deep wound)     Status: None (Preliminary result)   Collection Time: 08/16/20  4:30 PM   Specimen: PATH Gallbladder; Tissue  Result Value Ref Range Status   Specimen Description FLUID GALLBLADDER  Final   Special Requests NONE  Final   Gram Stain   Final    ABUNDANT WBC PRESENT,BOTH PMN AND MONONUCLEAR NO ORGANISMS SEEN    Culture   Final    NO GROWTH 2 DAYS NO ANAEROBES ISOLATED; CULTURE IN PROGRESS FOR 5 DAYS Performed at [facility], [place] [address], [place], [place] [address]    Report Status PENDING  Incomplete  Aerobic/Anaerobic Culture w Gram Stain (surgical/deep wound)     Status: None (Preliminary result)   Collection Time: 08/20/2020  8:14 AM   Specimen: Wound  Result Value Ref Range Status   Specimen Description WOUND AXILLARY GRAFT FROM IMPELLA [place] A  Final   Special Requests NONE   Final   Gram Stain   Final    RARE WBC PRESENT, PREDOMINANTLY MONONUCLEAR NO ORGANISMS SEEN    Culture   Final    NO GROWTH 1 DAY Performed at [facility], [place] [address] [place], [place] [address]    Report Status PENDING  Incomplete    Impression/Plan:  1. Fever - still remains febrile at a low level and no obvious source.  He remains on piperacillin/tazobactam with concern for acalculous cholecystitis and drain placed by IR.  No growth from the cultures.   Lines all changed out.  At this point, fevers mild, I suspect related to a non-infectious process but will continue with antibiotics for a 7 day course, now day 4.    2.  transaminitis - this is improving with AST and ALT now wnl.  Will continue to monitor.   3.  Cardiogenic shock - Impella removed and on milrinone.

## 2020-08-20 ENCOUNTER — Encounter (HOSPITAL_COMMUNITY): Payer: Self-pay | Admitting: Cardiothoracic Surgery

## 2020-08-20 DIAGNOSIS — I5043 Acute on chronic combined systolic (congestive) and diastolic (congestive) heart failure: Secondary | ICD-10-CM | POA: Diagnosis not present

## 2020-08-20 DIAGNOSIS — N179 Acute kidney failure, unspecified: Secondary | ICD-10-CM | POA: Diagnosis not present

## 2020-08-20 DIAGNOSIS — K810 Acute cholecystitis: Secondary | ICD-10-CM | POA: Diagnosis not present

## 2020-08-20 LAB — COOXEMETRY PANEL
Carboxyhemoglobin: 1.7 % — ABNORMAL HIGH (ref 0.5–1.5)
Methemoglobin: 0.8 % (ref 0.0–1.5)
O2 Saturation: 79.6 %
Total hemoglobin: 7.9 g/dL — ABNORMAL LOW (ref 12.0–16.0)

## 2020-08-20 LAB — CBC
HCT: 26.4 % — ABNORMAL LOW (ref 39.0–52.0)
Hemoglobin: 8 g/dL — ABNORMAL LOW (ref 13.0–17.0)
MCH: 28.7 pg (ref 26.0–34.0)
MCHC: 30.3 g/dL (ref 30.0–36.0)
MCV: 94.6 fL (ref 80.0–100.0)
Platelets: 370 10*3/uL (ref 150–400)
RBC: 2.79 MIL/uL — ABNORMAL LOW (ref 4.22–5.81)
RDW: 18.9 % — ABNORMAL HIGH (ref 11.5–15.5)
WBC: 8 10*3/uL (ref 4.0–10.5)
nRBC: 0 % (ref 0.0–0.2)

## 2020-08-20 LAB — GLUCOSE, CAPILLARY
Glucose-Capillary: 204 mg/dL — ABNORMAL HIGH (ref 70–99)
Glucose-Capillary: 247 mg/dL — ABNORMAL HIGH (ref 70–99)
Glucose-Capillary: 304 mg/dL — ABNORMAL HIGH (ref 70–99)
Glucose-Capillary: 250 mg/dL — ABNORMAL HIGH (ref 70–99)

## 2020-08-20 LAB — BASIC METABOLIC PANEL
Anion gap: 9 (ref 5–15)
Anion gap: 10 (ref 5–15)
BUN: 83 mg/dL — ABNORMAL HIGH (ref 8–23)
BUN: 78 mg/dL — ABNORMAL HIGH (ref 8–23)
CO2: 25 mmol/L (ref 22–32)
CO2: 23 mmol/L (ref 22–32)
Calcium: 8 mg/dL — ABNORMAL LOW (ref 8.9–10.3)
Calcium: 8.3 mg/dL — ABNORMAL LOW (ref 8.9–10.3)
Chloride: 115 mmol/L — ABNORMAL HIGH (ref 98–111)
Chloride: 116 mmol/L — ABNORMAL HIGH (ref 98–111)
Creatinine, Ser: 2.82 mg/dL — ABNORMAL HIGH (ref 0.61–1.24)
Creatinine, Ser: 2.61 mg/dL — ABNORMAL HIGH (ref 0.61–1.24)
GFR, Estimated: 24 mL/min — ABNORMAL LOW (ref >60)
GFR, Estimated: 26 mL/min — ABNORMAL LOW (ref >60)
Glucose, Bld: 217 mg/dL — ABNORMAL HIGH (ref 70–99)
Glucose, Bld: 266 mg/dL — ABNORMAL HIGH (ref 70–99)
Potassium: 4.1 mmol/L (ref 3.5–5.1)
Potassium: 4.1 mmol/L (ref 3.5–5.1)
Sodium: 149 mmol/L — ABNORMAL HIGH (ref 135–145)
Sodium: 149 mmol/L — ABNORMAL HIGH (ref 135–145)

## 2020-08-20 LAB — CULTURE, BLOOD (ROUTINE X 2)
Culture: NO GROWTH
Culture: NO GROWTH
Special Requests: ADEQUATE
Special Requests: ADEQUATE

## 2020-08-20 LAB — TYPE AND SCREEN
ABO/RH(D): A POS
Antibody Screen: NEGATIVE
Unit division: 0

## 2020-08-20 LAB — POCT I-STAT 7, (LYTES, BLD GAS, ICA,H+H)
Acid-Base Excess: 2 mmol/L (ref 0.0–2.0)
Bicarbonate: 25.8 mmol/L (ref 20.0–28.0)
Calcium, Ion: 1.13 mmol/L — ABNORMAL LOW (ref 1.15–1.40)
HCT: 23 % — ABNORMAL LOW (ref 39.0–52.0)
Hemoglobin: 7.8 g/dL — ABNORMAL LOW (ref 13.0–17.0)
O2 Saturation: 99 %
Patient temperature: 37.9
Potassium: 4 mmol/L (ref 3.5–5.1)
Sodium: 149 mmol/L — ABNORMAL HIGH (ref 135–145)
TCO2: 27 mmol/L (ref 22–32)
pCO2 arterial: 39 mmHg (ref 32.0–48.0)
pH, Arterial: 7.432 (ref 7.350–7.450)
pO2, Arterial: 134 mmHg — ABNORMAL HIGH (ref 83.0–108.0)

## 2020-08-20 LAB — HEPATIC FUNCTION PANEL
ALT: 44 U/L (ref 0–44)
AST: 61 U/L — ABNORMAL HIGH (ref 15–41)
Albumin: 1.7 g/dL — ABNORMAL LOW (ref 3.5–5.0)
Alkaline Phosphatase: 291 U/L — ABNORMAL HIGH (ref 38–126)
Bilirubin, Direct: 0.6 mg/dL — ABNORMAL HIGH (ref 0.0–0.2)
Indirect Bilirubin: 0.7 mg/dL (ref 0.3–0.9)
Total Bilirubin: 1.3 mg/dL — ABNORMAL HIGH (ref 0.3–1.2)
Total Protein: 6 g/dL — ABNORMAL LOW (ref 6.5–8.1)

## 2020-08-20 LAB — HEPARIN LEVEL (UNFRACTIONATED)
Heparin Unfractionated: 0.12 IU/mL — ABNORMAL LOW (ref 0.30–0.70)
Heparin Unfractionated: 0.17 IU/mL — ABNORMAL LOW (ref 0.30–0.70)

## 2020-08-20 LAB — BPAM RBC
Blood Product Expiration Date: 202206052359
ISSUE DATE / TIME: 202205211140
Unit Type and Rh: 6200

## 2020-08-20 LAB — LACTATE DEHYDROGENASE: LDH: 260 U/L — ABNORMAL HIGH (ref 98–192)

## 2020-08-20 MED ORDER — ISOSORBIDE DINITRATE 10 MG PO TABS
10.0000 mg | ORAL_TABLET | Freq: Three times a day (TID) | ORAL | Status: DC
  Administered 2020-08-20 (×3): 10 mg
  Filled 2020-08-20 (×3): qty 1

## 2020-08-20 MED ORDER — HYDRALAZINE HCL 25 MG PO TABS
12.5000 mg | ORAL_TABLET | Freq: Three times a day (TID) | ORAL | Status: DC
  Administered 2020-08-20 – 2020-08-21 (×4): 12.5 mg
  Filled 2020-08-20 (×4): qty 1

## 2020-08-20 MED ORDER — INSULIN DETEMIR 100 UNIT/ML ~~LOC~~ SOLN
20.0000 [IU] | Freq: Two times a day (BID) | SUBCUTANEOUS | Status: DC
  Administered 2020-08-20 – 2020-08-22 (×5): 20 [IU] via SUBCUTANEOUS
  Filled 2020-08-20 (×6): qty 0.2

## 2020-08-20 MED ORDER — FREE WATER
200.0000 mL | Freq: Four times a day (QID) | Status: DC
  Administered 2020-08-20 – 2020-08-22 (×8): 200 mL

## 2020-08-20 NOTE — Progress Notes (Addendum)
Patient ID: Danny Carpenter, male   DOB: 08/12/1950, 70 y.o.   MRN: [id number]     Advanced Heart Failure Rounding Note  PCP-Cardiologist: None   Subjective:    - 4/26 S/P CABG - 5/4 Swan removed with ongoing fevers, CVVH started - CT head on 5/6 with multiple posterior circulation infarcts.  - 5/10 tracheostomy. CT head similar to prior with multiple small infarcts.  - 5/14 Torsades -> paced out by Dr. Orvan Seen. Impella turned back up from P3 -> P7  after code. Amio gtt switched to lidocaine.   - 5/16 Eraxis added for persistent fevers. Cultures resent>>pending  - 5/17 CT chest/abdomen/pelvis and abdominal US suggestive of acalculous cholecystitis - 5/18 cholecystostomy tube - 5/20 Return to the OR today for Impella removal. TEE with EF 30%-->started on epi 2 mcg with improved EF to 50%.   Continues on mexiletine, in AF but no VT.   CVP 5 this morning, I/O net negative 2840 yesterday with 1 dose IV Lasix.  Co-ox 80%, now on milrinone 0.125 and off epinephrine.  Creatinine lower at 2.82.   Tm 100.7, remains on Zosyn.  Has biliary drain. WBCs 8.    1 unit PRBCs yesterday with hgb up to 8 today.    Objective:   Weight Range: 108.9 kg Body mass index is 31.67 kg/m.   Vital Signs:   Temp:  [99.32 F (37.4 C)-100.76 F (38.2 C)] 100.4 F (38 C) (05/22 0800) Pulse Rate:  [72-117] 84 (05/22 0800) Resp:  [0-28] 26 (05/22 0800) BP: (130-167)/(53-80) 161/77 (05/22 0800) SpO2:  [99 %-100 %] 99 % (05/22 0800) Arterial Line BP: (132-183)/(43-106) 164/63 (05/22 0800) FiO2 (%):  [40 %] 40 % (05/22 0759) Last BM Date: 08/20/20  Weight change: Filed Weights   08/16/20 0500 08/17/20 0500 08/21/2020 0500  Weight: 114.3 kg 114.1 kg 108.9 kg    Intake/Output:   Intake/Output Summary (Last 24 hours) at 08/20/2020 0847 Last data filed at 08/20/2020 0800 Gross per 24 hour  Intake 3673.53 ml  Output 4080 ml  Net -406.47 ml      Physical Exam  CVP 5 General: On vent via trach Neck: No  JVD, no thyromegaly or thyroid nodule.  Lungs: Clear to auscultation bilaterally with normal respiratory effort. CV: Nondisplaced PMI.  Heart irregular S1/S2, no S3/S4, no murmur.  Trace LE edema. Abdomen: Soft, nontender, no hepatosplenomegaly, no distention.  Skin: Intact without lesions or rashes.  Neurologic: Eyes open, not responsive.  Extremities: No clubbing or cyanosis.  HEENT: Right eye red with yellow-green drainage.    Telemetry    A Fib 70s (personally reviewed)  Labs    CBC Recent Labs    08/19/20 0450 08/20/20 0420 08/20/20 0425  WBC 7.9  --  8.0  HGB 7.0* 7.8* 8.0*  HCT 22.9* 23.0* 26.4*  MCV 95.4  --  94.6  PLT 323  --  741   Basic Metabolic Panel Recent Labs    08/17/20 1552 08/15/2020 0528 08/19/20 0450 08/20/20 0420 08/20/20 0425  NA 143   < > 144 149* 149*  K 4.4   < > 3.7 4.0 4.1  CL 107   < > 109  --  115*  CO2 24   < > 23  --  25  GLUCOSE 164*   < > 253*  --  217*  BUN 93*   < > 93*  --  83*  CREATININE 3.76*   < > 3.52*  --  2.82*  CALCIUM 7.7*   < >  7.7*  --  8.0*  MG 2.3  --   --   --   --    < > = values in this interval not displayed.   Liver Function Tests Recent Labs    08/19/20 0450 08/20/20 0425  AST 33 61*  ALT 31 44  ALKPHOS 305* 291*  BILITOT 1.0 1.3*  PROT 5.9* 6.0*  ALBUMIN 1.7* 1.7*   No results for input(s): LIPASE, AMYLASE in the last 72 hours. Cardiac Enzymes No results for input(s): CKTOTAL, CKMB, CKMBINDEX, TROPONINI in the last 72 hours.  BNP: BNP (last 3 results) Recent Labs    07/29/2020 1211  BNP 465.5*    ProBNP (last 3 results) No results for input(s): PROBNP in the last 8760 hours.   D-Dimer No results for input(s): DDIMER in the last 72 hours. Hemoglobin A1C No results for input(s): HGBA1C in the last 72 hours. Fasting Lipid Panel No results for input(s): CHOL, HDL, LDLCALC, TRIG, CHOLHDL, LDLDIRECT in the last 72 hours. Thyroid Function Tests No results for input(s): TSH, T4TOTAL, T3FREE,  THYROIDAB in the last 72 hours.  Invalid input(s): FREET3  Other results:   Imaging    No results found.   Medications:     Scheduled Medications: . aspirin EC  325 mg Oral Daily   Or  . aspirin  324 mg Per Tube Daily  . chlorhexidine gluconate (MEDLINE KIT)  15 mL Mouth Rinse BID  . Chlorhexidine Gluconate Cloth  6 each Topical Daily  . darbepoetin (ARANESP) injection - NON-DIALYSIS  150 mcg Subcutaneous Q Wed-1800  . feeding supplement (PROSource TF)  45 mL Per Tube 5 X Daily  . ferrous [id number] C-folic acid  1 capsule Oral BID PC  . fiber  1 packet Per Tube BID  . Gerhardt's butt cream   Topical QID  . insulin aspart  0-15 Units Subcutaneous Q4H  . insulin detemir  15 Units Subcutaneous BID  . levalbuterol  0.63 mg Nebulization TID  . mouth rinse  15 mL Mouth Rinse 10 times per day  . mexiletine  200 mg Per Tube Q12H  . midodrine  10 mg Per Tube TID WC  . pantoprazole sodium  40 mg Per Tube Daily  . rosuvastatin  10 mg Per Tube Daily  . sodium chloride flush  10-40 mL Intracatheter Q12H  . sodium chloride flush  5 mL Intracatheter Q8H    Infusions: . sodium chloride Stopped (08/06/2020 0734)  . sodium chloride    . sodium chloride    . sodium chloride Stopped (08/20/20 0746)  . dextrose Stopped (08/17/20 1116)  . feeding supplement (VITAL 1.5 CAL) 55 mL/hr at 08/20/20 0600  . heparin 2,100 Units/hr (08/20/20 0800)  . milrinone 0.125 mcg/kg/min (08/20/20 0800)  . piperacillin-tazobactam (ZOSYN)  IV 12.5 mL/hr at 08/20/20 0800    PRN Medications: sodium chloride, Place/Maintain arterial line **AND** sodium chloride, acetaminophen (TYLENOL) oral liquid 160 mg/5 mL, fentaNYL (SUBLIMAZE) injection, polyvinyl alcohol, sodium chloride flush  Assessment/Plan   1. Cardiogenic shock: Ischemic cardiomyopathy, post-CABG on 4/26.  He has Impella 5.5 in place, down to P4 with no alarms and stable LDH.  Limited echo 4/29 with EF< 20%, the RV appears normal in  size with severe systolic dysfunction.  Echo with some improvement, EF in 30-35% range on 5/9 echo. Suspect component of septic/distributive shock. Impella out 5/20. EF 30% intraop TEE 5/20.  Today, on milrinone 0.125, CVP 5 with co-ox 80%.  SBP 140s-170s.  - Continue milrinone 0.125  for now, may stop tomorrow.  - Stop midodrine and add hydralazine 12.5 tid + isordil 10 tid.   - No diuretic today.  2. CAD: s/p CABG x 5 with LIMA-LAD, seq SVG-D1/ramus, seq SVG-PDA/PLV.   - ASA  - Crestor 3. Anemia: Post-op bleeding, back to OR with multiple products given on 4/26 post-CABG.  Hgb 8, no overt bleeding. Got 1 unit PRBCs on 5/21.  4. Thrombocytopenia: Resolved. suspect low post-op/post-surgical bleeding and multiple blood products as well as sepsis.  5. PAD: Extensive history.  6. AAA: Monitoring as outpatient. 4.5 cm on CT.  7. Type 2 DM: Insulin.  Hgb A1C 11.6, poor control.  8. Atrial fibrillation: Rate controlled.  Off amio with torsades and possible QT prolongation.  - Heparin gtt.  9. Neuro: intubated and sedated, not following commands with sedation wean.  Head CT 5/6 with multiple posterior circulation infarcts. Head CT 5/10 no change. Eyes open and withdraws from pain. NH3 was not elevated.  - Neuro has seen.  - Palliative Care following.  - Eventually needs MRI head when pacing wires out.  10. ID:  PCT 7.5 on 4/29. Cultures so far negative.  Now with cholecystostomy drain for acalculous cholecystitis. Off Eraxis now and on Zosyn for biliary coverage. ID following.  Persistent fevers, Tm 100.7, WBCs 8.  Lines all have been changed out.  - Continue Zosyn.  - ?Noninfectious fever  - ID following.  11. Acute hypoxemic respiratory failure: S/p tracheostomy on 5/10.  - trach management per PCCM  12. AKI:  CVVH started with marked volume overload and worsening renal function.  Now off CVVH with good UOP and creatinine gradually decreasing.  13. FEN: Tube feeds, will need PEG.  14. Torsades:  Paced out by Dr Orvan Seen on 5/14. Amiodarone stopped.  - continue Mexiletine  15. GOC: was limited code and now changed back to Full Code 16. Acute acalculous cholecystitis: S/p biliary drain placement.   - Zosyn for biliary coverage.  17. Red eye: Right eye red with copious yellow-green drainage.   - Not improving, think we will need ophthalmology to take a look.  Consult in am.  18. Hypernatremia: Na 149.  - Free water boluses 200 cc q 6hrs.   CRITICAL CARE Performed by: Loralie Champagne  Total critical care time: 35 minutes  Critical care time was exclusive of separately billable procedures and treating other patients.  Critical care was necessary to treat or prevent imminent or life-threatening deterioration.  Critical care was time spent personally by me on the following activities: development of treatment plan with patient and/or surrogate as well as nursing, discussions with consultants, evaluation of patient's response to treatment, examination of patient, obtaining history from patient or surrogate, ordering and performing treatments and interventions, ordering and review of laboratory studies, ordering and review of radiographic studies, pulse oximetry and re-evaluation of patient's condition.  Loralie Champagne 08/20/2020 8:47 AM

## 2020-08-20 NOTE — Progress Notes (Signed)
2 Days Post-Op Procedure(s) (LRB): REMOVAL OF IMPELLA LEFT VENTRICULAR ASSIST DEVICE (N/A) TRANSESOPHAGEAL ECHOCARDIOGRAM (TEE) (N/A) Subjective: trached on vent on CPAP/PS.  Opens eyes and tracks but not following commands for me.  Objective: Vital signs in last 24 hours: Temp:  [99.32 F (37.4 C)-100.76 F (38.2 C)] 100.4 F (38 C) (05/22 0800) Pulse Rate:  [72-117] 84 (05/22 0800) Cardiac Rhythm: Atrial fibrillation (05/22 0800) Resp:  [0-28] 26 (05/22 0800) BP: (130-167)/(53-80) 161/77 (05/22 0800) SpO2:  [99 %-100 %] 99 % (05/22 0800) Arterial Line BP: (132-183)/(43-106) 164/63 (05/22 0800) FiO2 (%):  [40 %] 40 % (05/22 0759)  Hemodynamic parameters for last 24 hours: CVP:  [5 mmHg] 5 mmHg  Intake/Output from previous day: 05/21 0701 - 05/22 0700 In: 1505.2 [I.V.:730.4; Blood:455; NG/GT:170; IV Piggyback:149.8] Out: [id number] [Urine:3530; Emesis/NG output:40; Drains:425; Stool:350] Intake/Output this shift: Total I/O In: 2168.3 [I.V.:40.6; Other:35; NG/GT:2090; IV Piggyback:2.7] Out: 275 [Urine:275]  Heart: regular rate and rhythm, S1, S2 normal, no murmur Lungs: clear to auscultation bilaterally Abdomen: soft, non-tender; bowel sounds normal Extremities: edema moderate Wound: incisions ok  Lab Results: Recent Labs    08/19/20 0450 08/20/20 0420 08/20/20 0425  WBC 7.9  --  8.0  HGB 7.0* 7.8* 8.0*  HCT 22.9* 23.0* 26.4*  PLT 323  --  370   BMET:  Recent Labs    08/19/20 0450 08/20/20 0420 08/20/20 0425  NA 144 149* 149*  K 3.7 4.0 4.1  CL 109  --  115*  CO2 23  --  25  GLUCOSE 253*  --  217*  BUN 93*  --  83*  CREATININE 3.52*  --  2.82*  CALCIUM 7.7*  --  8.0*    PT/INR: No results for input(s): LABPROT, INR in the last 72 hours. ABG    Component Value Date/Time   PHART 7.432 08/20/2020 0420   HCO3 25.8 08/20/2020 0420   TCO2 27 08/20/2020 0420   ACIDBASEDEF 2.0 08/05/2020 0400   O2SAT 79.6 08/20/2020 0425   CBG (last 3)  Recent Labs     08/19/20 1945 08/19/20 2329 08/20/20 0418  GLUCAP 251* 231* 204*    Assessment/Plan: S/P Procedure(s) (LRB): REMOVAL OF IMPELLA LEFT VENTRICULAR ASSIST DEVICE (N/A) TRANSESOPHAGEAL ECHOCARDIOGRAM (TEE) (N/A)  Hemodynamically stable on milrinone 0.125.  VDRF: weaning per CCM  AKI: improving. Good urine output and creat decreasing.   Anemia: Hgb improved after transfusion.   Continues to have low grade fevers with normal WBC ct. Has GB drain for acalculous cholecystitis and Zosyn. ID following.   Glucose > 200. levemir increased to 20 bid.  LOS: 31 days    Gaye Pollack 08/20/2020

## 2020-08-20 NOTE — Progress Notes (Signed)
ANTICOAGULATION CONSULT NOTE  Pharmacy Consult for Heparin Indication: stroke  No Known Allergies  Patient Measurements: Height: '6\' 1"'$  (185.4 cm) Weight: 108.9 kg (240 lb 1.3 oz) IBW/kg (Calculated) : 79.9 Heparin Dosing Weight: 102.8 kg  Vital Signs: Temp: 100.76 F (38.2 C) (05/22 0000) Temp Source: Bladder (05/21 1600) BP: 144/65 (05/22 0026) Pulse Rate: 88 (05/22 0026)  Labs: Recent Labs    08/17/20 0412 08/17/20 1552 08/22/2020 0528 08/27/2020 2008 08/19/20 0447 08/19/20 0450 08/19/20 1322 08/19/20 2351  HGB 8.5*  --  8.3*  --  6.8* 7.0*  --   --   HCT 27.2*  --  26.5*  --  20.0* 22.9*  --   --   PLT 270  --  312  --   --  323  --   --   HEPARINUNFRC 0.15* 0.14* 0.11*   < >  --  <0.10* 0.13* 0.17*  CREATININE 3.75* 3.76* 3.68*  --   --  3.52*  --   --    < > = values in this interval not displayed.    Estimated Creatinine Clearance: 26 mL/min (A) (by C-G formula based on SCr of 3.52 mg/dL (H)).   Assessment: 70 yo M presents with NSTEMI and multivessel CAD, now s/p CABG with Impella support. Pharmacy asked to manage systemic heparin. Heparin was held following IR guided drain placement and  removal of Impella 5.5. Anticoagulation to be continued for stroke.   Heparin level remains subtherapeutic (0.17) on gtt at 1900 units/hr. No issues with line or bleeding reported per RN.  Goal of Therapy:  Heparin level 0.3-0.5 units/ml Monitor platelets by anticoagulation protocol: Yes   Plan:  Increase heparin infusion to 2100 units / hr  Order heparin level in 8 hours  F/u s/sx bleeding, CBC, HL daily  Sherlon Handing, PharmD, BCPS Please see amion for complete clinical pharmacist phone list  08/20/2020 12:37 AM

## 2020-08-20 NOTE — Progress Notes (Signed)
Patient ID: Danny Carpenter, male   DOB: 27-Dec-1950, 70 y.o.   MRN: [id number]  TCTS Evening Rounds:   Hemodynamically stable. Milrinone 0.125  Low grade fever most of the day, 100.9 max this am. On Zosyn for acalculous cholecystitis.  Stable on vent  Urine output good    CBC    Component Value Date/Time   WBC 8.0 08/20/2020 0425   RBC 2.79 (L) 08/20/2020 0425   HGB 8.0 (L) 08/20/2020 0425   HCT 26.4 (L) 08/20/2020 0425   PLT 370 08/20/2020 0425   MCV 94.6 08/20/2020 0425   MCH 28.7 08/20/2020 0425   MCHC 30.3 08/20/2020 0425   RDW 18.9 (H) 08/20/2020 0425   LYMPHSABS 1.2 08/13/2020 1526   MONOABS 0.9 08/13/2020 1526   EOSABS 0.5 08/13/2020 1526   BASOSABS 0.1 08/13/2020 1526     BMET    Component Value Date/Time   NA 149 (H) 08/20/2020 1545   K 4.1 08/20/2020 1545   CL 116 (H) 08/20/2020 1545   CO2 23 08/20/2020 1545   GLUCOSE 266 (H) 08/20/2020 1545   BUN 78 (H) 08/20/2020 1545   CREATININE 2.61 (H) 08/20/2020 1545   CALCIUM 8.3 (L) 08/20/2020 1545   GFRNONAA 26 (L) 08/20/2020 1545   GFRAA >60 08/08/2019 0352     A/P:  Stable day. Continue present care.

## 2020-08-20 NOTE — Progress Notes (Signed)
Assisted tele visit to patient with family member.  Creola Krotz M, RN  

## 2020-08-20 NOTE — Progress Notes (Addendum)
Assisted tele visit to patient with family member. Family never joined the call, bedside RN aware. elink will try again if family calls back Skeet Simmer, Lowella Dell, RN

## 2020-08-20 NOTE — Progress Notes (Signed)
ANTICOAGULATION CONSULT NOTE  Pharmacy Consult for Heparin Indication: stroke  No Known Allergies  Patient Measurements: Height: '6\' 1"'$  (185.4 cm) Weight: 108.9 kg (240 lb 1.3 oz) IBW/kg (Calculated) : 79.9 Heparin Dosing Weight: 102.8 kg  Vital Signs: Temp: 100.58 F (38.1 C) (05/22 1200) Temp Source: Bladder (05/22 1200) BP: 150/65 (05/22 1200) Pulse Rate: 97 (05/22 1200)  Labs: Recent Labs    08/12/2020 0528 08/19/2020 2008 08/19/20 0450 08/19/20 1322 08/19/20 2351 08/20/20 0420 08/20/20 0425 08/20/20 1102  HGB 8.3*   < > 7.0*  --   --  7.8* 8.0*  --   HCT 26.5*   < > 22.9*  --   --  23.0* 26.4*  --   PLT 312  --  323  --   --   --  370  --   HEPARINUNFRC 0.11*   < > <0.10* 0.13* 0.17*  --   --  0.12*  CREATININE 3.68*  --  3.52*  --   --   --  2.82*  --    < > = values in this interval not displayed.    Estimated Creatinine Clearance: 32.4 mL/min (A) (by C-G formula based on SCr of 2.82 mg/dL (H)).   Assessment: 70 yo M presents with NSTEMI and multivessel CAD, now s/p CABG with Impella support. Pharmacy asked to manage systemic heparin. Heparin was held following IR guided drain placement and  removal of Impella 5.5. Anticoagulation to be continued for stroke.   Heparin level remains subtherapeutic (0.12) on gtt at 2100 units/hr. No issues with line or bleeding reported per RN.  Goal of Therapy:  Heparin level 0.3-0.5 units/ml Monitor platelets by anticoagulation protocol: Yes   Plan:  Increase heparin infusion to 2250 units/hr. Order heparin level in 8 hours  F/u s/sx bleeding, CBC, HL daily   [place] Crane, Roylene Reason, [facility] Clinical Pharmacist  08/20/2020 12:34 PM   [facility] pharmacy phone numbers are listed on [email or web address]

## 2020-08-20 NOTE — Progress Notes (Signed)
NAMEJamyson Carpenter, MRN:  [id number], DOB:  1950/06/03, LOS: 51 ADMISSION DATE:  07/16/2020, CONSULTATION DATE: 07/28/2018 REFERRING MD: Dr. Orvan Seen, CHIEF COMPLAINT: Ventilator dependent  History of Present Illness:  Danny Carpenter is a 70 year old male with past medical history significant for hypertension, hyperlipidemia, type 2 diabetes, CVA, AAA, left foot amputation s/p fem pop bypass and heart failure who presented to the emergency department 4/21 after undergoing outpatient stress test that revealed EF of 20% and acute ischemia.  Since admission patient has been evaluated by cardiology and cardiothoracic surgery and patient underwent TEE and left heart cath.  LHC/RHC revealed severe three-vessel coronary artery disease with moderately elevated left heart and pulmonary artery pressures.  At that time patient was placed on goal-directed medical therapy for acute systolic congestive heart failure and CTS surgery was consulted for CABG potential.  Patient underwent CABG x5 with Dr. Orvan Seen 4/26, post CABG CODE BLUE called due to low flow on Impella, CTS notified.  Patient underwent bedside exploratory postoperative open heart where Dr. Julien Girt reopened the chest incision and hematoma was removed, patient's hemodynamics improved upon opening sternotomy.  PCCM consulted morning of 4/28 due to difficulty weaning ventilator  Pertinent  Medical History  Hypertension, hyperlipidemia, type 2 diabetes, CVA, AAA, left foot amputation s/p fem pop bypass and heart failure   Significant Hospital Events: Including procedures, antibiotic start and stop dates in addition to other pertinent events   . 4/21 presented to the ED for evaluation of abnormal stress test . 4/22 left and right heart cath severe multivessel disease . 4/26 CABG x5 with hemodynamic instability post requiring brief CPR with eventual return back to the OR anastomosis bleeding was seen . 4/28 remains ventilator dependent, slowly weaning  PEEP . 5/1 remains on vent, diuresing, no ready to come off  . 5/3 started on CRRT. . 5/5 requiring sedation for vent  . 5/6 CT scan shows multiple areas of posterior circulation infarction. . 5/8 tolerating CRRT with fluid removal.  Decreasing vasopressor requirements. . 5/12 TCT today . 5/13 VT event, self converted, febrile . 5/14 spent ~30 mins in Torsades until paced out, code status reversed by Dr. Julien Girt, patient spent 30 mins with no pulsatility and no flow except for the impella.  . 5/16 starting eraxis for persistent fevers . 5/17 remains febrile; planning to pan-CT . 5/18 perc bili drain placed for acalculous cholecystitis. Eraxis stopped by ID. Marland Kitchen 5/19 fentanyl turned off. . 5/20 Impella removed . 5/22 off epinephrine drip, on pressure support weans  Interim History / Subjective:   Off epinephrine, continues on milrinone, heparin drips Tolerating pressure support weans on vent today morning  Objective   Blood pressure (!) 161/77, pulse 84, temperature (!) 100.4 F (38 C), temperature source Bladder, resp. rate (!) 26, height '6\' 1"'$  (1.854 m), weight 108.9 kg, SpO2 99 %. CVP:  [5 mmHg] 5 mmHg  Vent Mode: PSV;CPAP FiO2 (%):  [40 %] 40 % Set Rate:  [20 bmp] 20 bmp Vt Set:  [630 mL] 630 mL PEEP:  [5 cmH20] 5 cmH20 Pressure Support:  [10 cmH20] 10 cmH20 Plateau Pressure:  [19 cmH20-24 cmH20] 19 cmH20   Intake/Output Summary (Last 24 hours) at 08/20/2020 1019 Last data filed at 08/20/2020 0800 Gross per 24 hour  Intake 3673.53 ml  Output 4080 ml  Net -406.47 ml   Filed Weights   08/16/20 0500 08/17/20 0500 08/25/2020 0500  Weight: 114.3 kg 114.1 kg 108.9 kg    Examination: Gen:  No acute distress, chronically ill-appearing HEENT:  EOMI, sclera anicteric Neck:     No masses; no thyromegaly, trach Lungs:    Clear to auscultation bilaterally; normal respiratory effort CV:         Regular rate and rhythm; no murmurs Abd:      + bowel sounds; soft, non-tender; no  palpable masses, no distension Ext:    No edema; adequate peripheral perfusion Skin:      Warm and dry; no rash Neuro: Somnolent, arousable  Labs/imaging that I have personally reviewed    Sodium 149, creatinine 2.82, WBC 8, hemoglobin 8, platelets 370 No new imaging  Resolved Hospital Problem list     Assessment & Plan:   Acute hypoxic vent- dependent  respiratory failure  Tracheostomy dependent R pleural effusion, mild pulmonary edema  Plan: Starting pressure support wean Intermittent chest x-ray Routine trach care  Cardiogenic shock due to ischemic cardiomyopathy CAD s/p CABG x5 Impella removed Continue milrinone, off epinephrine  Acute Encephalopathy: multifactorial in setting of critical illness. Prolonged icu stay, posterior circulation CVA. Concern for anoxic brain injury during low flow state -Unable to get brain MRI due to pacing wires for further evaluation. Monitor neuro status  Ongoing fevers- improving - HD catheter and Foley exchanged previously. Con't dialysis catheter as he is still at risk of requiring CRRT --con't biliary drain.  He was previously on Eraxis.  On Zosyn.  Follow cultures ID is on board  AKI Oliguria improved Monitor urine output and creatinine  Colonic ileus, resolved -Increasing TF towards goal, monitor for intolerance. Acute blood loss anemia due to operative blood loss and critical illness Thrombocytopenia -resolved Follow CBC  AAA Supportive care  Hyperglycemia- controlled.  History of poorly controlled diabetes PTA. SSI coverage, Levemir  PAD s/p partial L foot amputation  -monitor peripheral circulation-- has been stable this week -Aspirin and statin daily  Best practice   Diet:  Tube Feed  - on hold for procedures Pain/Anxiety/Delirium protocol (if indicated): Yes (RASS goal -1)  VAP protocol (if indicated): Yes DVT prophylaxis: Systemic AC - Heparin  GI prophylaxis: PPI   Glucose control:  SSI Yes - SSI + basal   Central venous access:  Yes, and it is still needed, Arterial line:  Yes, and it is still needed  Foley:  Yes, and it is still needed  Mobility:  bed rest  PT consulted: N/A Last date of multidisciplinary goals of care discussion: ongoing  Code Status:  full code Disposition: ICU  The patient is critically ill with multiple organ system failure and requires high complexity decision making for assessment and support, frequent evaluation and titration of therapies, advanced monitoring, review of radiographic studies and interpretation of complex data.   Critical Care Time devoted to patient care services, exclusive of separately billable procedures, described in this note is 35 minutes.   Marshell Garfinkel MD [facility] See Amion for pager  If no response to pager , please call [phone] until 7pm After 7:00 pm call Elink  [phone] 08/20/2020, 10:19 AM

## 2020-08-20 NOTE — Progress Notes (Signed)
Assisted tele visit to patient with family member.  Raheem Kolbe M, RN  

## 2020-08-21 ENCOUNTER — Inpatient Hospital Stay (HOSPITAL_COMMUNITY): Payer: No Typology Code available for payment source

## 2020-08-21 DIAGNOSIS — Z9911 Dependence on respirator [ventilator] status: Secondary | ICD-10-CM

## 2020-08-21 DIAGNOSIS — R570 Cardiogenic shock: Secondary | ICD-10-CM | POA: Diagnosis not present

## 2020-08-21 DIAGNOSIS — I5043 Acute on chronic combined systolic (congestive) and diastolic (congestive) heart failure: Secondary | ICD-10-CM | POA: Diagnosis not present

## 2020-08-21 DIAGNOSIS — K810 Acute cholecystitis: Secondary | ICD-10-CM | POA: Diagnosis not present

## 2020-08-21 DIAGNOSIS — Z7189 Other specified counseling: Secondary | ICD-10-CM | POA: Diagnosis not present

## 2020-08-21 DIAGNOSIS — Z515 Encounter for palliative care: Secondary | ICD-10-CM | POA: Diagnosis not present

## 2020-08-21 DIAGNOSIS — Z951 Presence of aortocoronary bypass graft: Secondary | ICD-10-CM | POA: Diagnosis not present

## 2020-08-21 LAB — CBC
HCT: 29.3 % — ABNORMAL LOW (ref 39.0–52.0)
Hemoglobin: 9 g/dL — ABNORMAL LOW (ref 13.0–17.0)
MCH: 29.2 pg (ref 26.0–34.0)
MCHC: 30.7 g/dL (ref 30.0–36.0)
MCV: 95.1 fL (ref 80.0–100.0)
Platelets: 408 10*3/uL — ABNORMAL HIGH (ref 150–400)
RBC: 3.08 MIL/uL — ABNORMAL LOW (ref 4.22–5.81)
RDW: 18.8 % — ABNORMAL HIGH (ref 11.5–15.5)
WBC: 8.5 10*3/uL (ref 4.0–10.5)
nRBC: 0 % (ref 0.0–0.2)

## 2020-08-21 LAB — BASIC METABOLIC PANEL
Anion gap: 9 (ref 5–15)
BUN: 74 mg/dL — ABNORMAL HIGH (ref 8–23)
CO2: 24 mmol/L (ref 22–32)
Calcium: 8.3 mg/dL — ABNORMAL LOW (ref 8.9–10.3)
Chloride: 116 mmol/L — ABNORMAL HIGH (ref 98–111)
Creatinine, Ser: 2.45 mg/dL — ABNORMAL HIGH (ref 0.61–1.24)
GFR, Estimated: 28 mL/min — ABNORMAL LOW (ref >60)
Glucose, Bld: 267 mg/dL — ABNORMAL HIGH (ref 70–99)
Potassium: 4.1 mmol/L (ref 3.5–5.1)
Sodium: 149 mmol/L — ABNORMAL HIGH (ref 135–145)

## 2020-08-21 LAB — HEPARIN LEVEL (UNFRACTIONATED): Heparin Unfractionated: 0.38 IU/mL (ref 0.30–0.70)

## 2020-08-21 LAB — AEROBIC/ANAEROBIC CULTURE W GRAM STAIN (SURGICAL/DEEP WOUND): Culture: NO GROWTH

## 2020-08-21 LAB — GLUCOSE, CAPILLARY
Glucose-Capillary: 203 mg/dL — ABNORMAL HIGH (ref 70–99)
Glucose-Capillary: 230 mg/dL — ABNORMAL HIGH (ref 70–99)
Glucose-Capillary: 231 mg/dL — ABNORMAL HIGH (ref 70–99)
Glucose-Capillary: 237 mg/dL — ABNORMAL HIGH (ref 70–99)
Glucose-Capillary: 240 mg/dL — ABNORMAL HIGH (ref 70–99)
Glucose-Capillary: 241 mg/dL — ABNORMAL HIGH (ref 70–99)
Glucose-Capillary: 247 mg/dL — ABNORMAL HIGH (ref 70–99)
Glucose-Capillary: 248 mg/dL — ABNORMAL HIGH (ref 70–99)

## 2020-08-21 LAB — COOXEMETRY PANEL
Carboxyhemoglobin: 1.5 % (ref 0.5–1.5)
Methemoglobin: 1.1 % (ref 0.0–1.5)
O2 Saturation: 74.3 %
Total hemoglobin: 8.1 g/dL — ABNORMAL LOW (ref 12.0–16.0)

## 2020-08-21 LAB — LACTATE DEHYDROGENASE: LDH: 257 U/L — ABNORMAL HIGH (ref 98–192)

## 2020-08-21 MED ORDER — ERYTHROMYCIN 5 MG/GM OP OINT
TOPICAL_OINTMENT | OPHTHALMIC | Status: AC
  Administered 2020-08-21 – 2020-08-24 (×11): 1 via OPHTHALMIC
  Filled 2020-08-21 (×5): qty 3.5

## 2020-08-21 MED ORDER — CHLORHEXIDINE GLUCONATE 0.12 % MT SOLN
OROMUCOSAL | Status: AC
  Administered 2020-08-21: 15 mL via OROMUCOSAL
  Filled 2020-08-21: qty 15

## 2020-08-21 MED ORDER — ARTIFICIAL TEARS OPHTHALMIC OINT: TOPICAL_OINTMENT | OPHTHALMIC | Status: DC

## 2020-08-21 MED ORDER — JUVEN PO PACK
1.0000 | PACK | Freq: Two times a day (BID) | ORAL | Status: DC
  Administered 2020-08-22: 1
  Filled 2020-08-21 (×2): qty 1

## 2020-08-21 MED ORDER — ISOSORB DINITRATE-HYDRALAZINE 20-37.5 MG PO TABS
1.0000 | ORAL_TABLET | Freq: Three times a day (TID) | ORAL | Status: DC
  Administered 2020-08-21 (×2): 1 via ORAL
  Filled 2020-08-21 (×3): qty 1

## 2020-08-21 MED ORDER — ISOSORB DINITRATE-HYDRALAZINE 20-37.5 MG PO TABS
1.0000 | ORAL_TABLET | Freq: Three times a day (TID) | ORAL | Status: DC
  Administered 2020-08-21: 1

## 2020-08-21 MED ORDER — FUROSEMIDE 10 MG/ML IJ SOLN
60.0000 mg | Freq: Once | INTRAMUSCULAR | Status: AC
  Administered 2020-08-21: 60 mg via INTRAVENOUS
  Filled 2020-08-21: qty 6

## 2020-08-21 MED ORDER — ARTIFICIAL TEARS OPHTHALMIC OINT
TOPICAL_OINTMENT | OPHTHALMIC | Status: DC
  Filled 2020-08-21: qty 3.5

## 2020-08-21 MED ORDER — ARTIFICIAL TEARS OPHTHALMIC OINT: TOPICAL_OINTMENT | OPHTHALMIC | Status: DC | PRN

## 2020-08-21 NOTE — Progress Notes (Signed)
NAMEIssa Carpenter, MRN:  [id number], DOB:  12-19-1950, LOS: 34 ADMISSION DATE:  07/24/2020, CONSULTATION DATE: 07/28/2018 REFERRING MD: Dr. Orvan Seen, CHIEF COMPLAINT: Ventilator dependent  History of Present Illness:  Danny Carpenter is a 70 year old male with past medical history significant for hypertension, hyperlipidemia, type 2 diabetes, CVA, AAA, left foot amputation s/p fem pop bypass and heart failure who presented to the emergency department 4/21 after undergoing outpatient stress test that revealed EF of 20% and acute ischemia.  Since admission patient has been evaluated by cardiology and cardiothoracic surgery and patient underwent TEE and left heart cath.  LHC/RHC revealed severe three-vessel coronary artery disease with moderately elevated left heart and pulmonary artery pressures.  At that time patient was placed on goal-directed medical therapy for acute systolic congestive heart failure and CTS surgery was consulted for CABG potential.  Patient underwent CABG x5 with Dr. Orvan Seen 4/26, post CABG CODE BLUE called due to low flow on Impella, CTS notified.  Patient underwent bedside exploratory postoperative open heart where Dr. Julien Girt reopened the chest incision and hematoma was removed, patient's hemodynamics improved upon opening sternotomy.  PCCM consulted morning of 4/28 due to difficulty weaning ventilator  Pertinent  Medical History  Hypertension, hyperlipidemia, type 2 diabetes, CVA, AAA, left foot amputation s/p fem pop bypass and heart failure   Significant Hospital Events: Including procedures, antibiotic start and stop dates in addition to other pertinent events   . 4/21 presented to the ED for evaluation of abnormal stress test . 4/22 left and right heart cath severe multivessel disease . 4/26 CABG x5 with hemodynamic instability post requiring brief CPR with eventual return back to the OR anastomosis bleeding was seen . 4/28 remains ventilator dependent, slowly weaning  PEEP . 5/1 remains on vent, diuresing, no ready to come off  . 5/3 started on CRRT. . 5/5 requiring sedation for vent  . 5/6 CT scan shows multiple areas of posterior circulation infarction. . 5/8 tolerating CRRT with fluid removal.  Decreasing vasopressor requirements. . 5/12 TCT today . 5/13 VT event, self converted, febrile . 5/14 spent ~30 mins in Torsades until paced out, code status reversed by Dr. Julien Girt, patient spent 30 mins with no pulsatility and no flow except for the impella.  . 5/16 starting eraxis for persistent fevers . 5/17 remains febrile; planning to pan-CT . 5/18 perc bili drain placed for acalculous cholecystitis. Eraxis stopped by ID. Marland Kitchen 5/19 fentanyl turned off. . 5/20 Impella removed . 5/22 off epinephrine drip, on pressure support weans . 5/23, tracheostomy tube in place, Impella removed, off pressors.  Interim History / Subjective:   Tracheostomy tube in place on mechanical support.  No fevers  Objective   Blood pressure (!) 186/74, pulse 89, temperature 99.9 F (37.7 C), temperature source Bladder, resp. rate (!) 21, height '6\' 1"'$  (1.854 m), weight 103.2 kg, SpO2 100 %. CVP:  [12 mmHg-19 mmHg] 15 mmHg  Vent Mode: PRVC FiO2 (%):  [40 %] 40 % Set Rate:  [20 bmp] 20 bmp Vt Set:  [630 mL] 630 mL PEEP:  [5 cmH20] 5 cmH20 Pressure Support:  [10 cmH20] 10 cmH20 Plateau Pressure:  [21 cmH20-28 cmH20] 21 cmH20   Intake/Output Summary (Last 24 hours) at 08/21/2020 0817 Last data filed at 08/21/2020 0700 Gross per 24 hour  Intake 2105.45 ml  Output 3400 ml  Net -1294.55 ml   Filed Weights   08/17/20 0500 08/20/2020 0500 08/21/20 0600  Weight: 114.1 kg 108.9 kg 103.2 kg  Examination: Gen:   Obese male, chronically ill-appearing, tracheostomy tube in place, not following any purposeful commands HEENT: Right eye with patch on it, there is concern of corneal abrasion, ophthalmology consult pending, tracheostomy tube in place, CDI Lungs:   Bilateral mechanically  ventilated breath sounds CV:        regular rate rhythm, S1-S2 Abd:   Bowel sounds present, soft nontender, cholecystostomy tube in place Ext: Trace edema Skin:     warm, no significant rash Neuro: He opens eyes to voice and to noxious stimuli.  He does not follow commands  Labs/imaging that I have personally reviewed    Sodium 149 Serum creatinine 2.45 Potassium 4.1 White blood cell count of 8.5 Hemoglobin 9 Platelets 408   Resolved Hospital Problem list     Assessment & Plan:   Acute hypoxic vent- dependent  respiratory failure  Tracheostomy dependent R pleural effusion, mild pulmonary edema  Plan: Remains on pressure support ventilation Continue to wean Chest x-ray as needed Continue routine trach care  Cardiogenic shock due to ischemic cardiomyopathy CAD s/p CABG x5 Recurrent episodes of VT Wean from vasopressor support per heart failure service Continue mexiletine, off lidocaine  Acute Encephalopathy: multifactorial in setting of critical illness. Prolonged icu stay, posterior circulation CVA. Concern for anoxic brain injury during low flow state -Unable to get brain MRI due to pacing wires for further evaluation. Plan: Supportive care  Ongoing fevers- improving - HD catheter and Foley exchanged previously. Con't dialysis catheter as he is still at risk of requiring CRRT --con't biliary drain.  He was previously on Eraxis.  On Zosyn.  Follow cultures Plan: Appreciate infectious disease input Continue Zosyn Off Eraxis  AKI Oliguria improved Monitor urine output and creatinine  Colonic ileus, resolved -Increasing TF towards goal, monitor for intolerance. Acute blood loss anemia due to operative blood loss and critical illness Thrombocytopenia -resolved Plan: Observe for any acute sign of bleeding Follow CBC  AAA Supportive care  Hyperglycemia- controlled.  History of poorly controlled diabetes PTA. Plan: SSI plus Levemir  PAD s/p partial L foot  amputation  -monitor peripheral circulation-- has been stable this week Plan: Aspirin plus statin  Best practice   Diet:  Tube Feed  Pain/Anxiety/Delirium protocol (if indicated): Yes (RASS goal -1)  VAP protocol (if indicated): Yes DVT prophylaxis: Systemic AC - Heparin  GI prophylaxis: PPI   Glucose control:  SSI Yes - SSI + basal  Central venous access:  Yes, and it is still needed, Arterial line:  N/A  Foley:  Yes, and it is still needed  Mobility:  bed rest  PT consulted: N/A Last date of multidisciplinary goals of care discussion: ongoing  Code Status:  full code Disposition: ICU  This patient is critically ill with multiple organ system failure; which, requires frequent high complexity decision making, assessment, support, evaluation, and titration of therapies. This was completed through the application of advanced monitoring technologies and extensive interpretation of multiple databases. During this encounter critical care time was devoted to patient care services described in this note for 33 minutes.  Garner Nash, DO [facility] 08/21/2020 8:17 AM

## 2020-08-21 NOTE — Progress Notes (Signed)
Assisted tele visit to patient with family member.  Lorell Thibodaux R, RN  

## 2020-08-21 NOTE — Progress Notes (Signed)
Nutrition Follow-up  DOCUMENTATION CODES:   Not applicable  INTERVENTION:   Tube Feeding via  Goal rate:Vital 1.5 at 55 ml/hr Pro-source TF45 mL5 times daily Provides 2180 kcals, 144g of protein and 1003 mL of free water  Add Juven BID, each packet provides 80 calories, 8 grams of carbohydrate, 2.5  grams of protein (collagen), 7 grams of L-arginine and 7 grams of L-glutamine; supplement contains CaHMB, Vitamins C, E, B12 and Zinc to promote wound healing   NUTRITION DIAGNOSIS:   Inadequate oral intake related to acute illness as evidenced by NPO status.  Being addressed via TF   GOAL:   Patient will meet greater than or equal to 90% of their needs  Progressing  MONITOR:   Vent status,TF tolerance,Labs,Weight trends  REASON FOR ASSESSMENT:   Ventilator    ASSESSMENT:   70 yo male admitted with abnormal stress test with chest pain, SOB, elevated troponin with acute on chronic CHF. PMH includes HTN, HLD, DM, CVA, AAA, left food amputation, CHF   4/26 CABG x 5, brief CPR, Intubated 4/29 Cortrak inserted, gastric 5/03 CRRT initiated 5/06 CT head: multiple posterior circulation infarcts 5/10 Trach placed 5/13 CRRT discontinued 5/14 Torsades 5/18 Transhepatic Cholecystostomy placed 5/20 Impella removed 5/23 Off pressors  Pt remains on vent support via trach  Vital 1.5 at 55 ml/hr, Pro-Source TF 45 mL 5 times daily via Cortrak tube. Free water 200 mL q 6 hours  CBGs consistently in 230-240s range; likely need to increase levemir  Hypernatremic, currently on free water flush of 200 mL q 6 hours and D5 at 50 ml/hr  Current wt 103.2 ; admit wt 107.6 kg. Net negative.   Biliary drain with 375 mL  Labs:  Sodium 149 (H), CBGs 231-247 Meds: ss novolog, levemir    Diet Order:   Diet Order            Diet NPO time specified  Diet effective midnight                 EDUCATION NEEDS:   Not appropriate for education at this time  Skin:  Skin  Assessment: Skin Integrity Issues: Skin Integrity Issues:: DTI,Other (Comment) DTI: buttocks Other: skin tear to anus  Last BM:  5/23 rectal tube  Height:   Ht Readings from Last 1 Encounters:  08/03/20 '6\' 1"'$  (1.854 m)    Weight:   Wt Readings from Last 1 Encounters:  08/21/20 103.2 kg    BMI:  Body mass index is 30.02 kg/m.  Estimated Nutritional Needs:   Kcal:  2100-2300 kcals  Protein:  130-150 g  Fluid:  >/= 2 L    Kerman Passey MS, RDN, LDN, CNSC Registered Dietitian III Clinical Nutrition RD Pager and On-Call Pager Number Located in [place]

## 2020-08-21 NOTE — Progress Notes (Signed)
[facility] Infectious Diseases Follow Up Note  Patient Identification: Patient Name: Danny Carpenter MRN: [id number] [place] Date: 07/28/2020 11:11 AM Age: 70 y.o.Today's Date: 08/21/2020  Reason for Visit: Fever  Principal Problem:   Acute acalculous cholecystitis Active Problems:   Acute on chronic heart failure (HCC)   Non-ST elevation (NSTEMI) myocardial infarction [facility])   Essential hypertension   History of open heart surgery   Cardiogenic shock ([place])   Coronary artery disease involving coronary bypass graft of native heart with unstable angina pectoris (HCC)   Pleural effusion on right   AKI (acute kidney injury) ([place])   Cerebral embolism with cerebral infarction   Pressure injury of skin   Fever   Palliative care by specialist   Antibiotics: Meropenem 5/10-5/16                   anidulafungin 5/16-5/17                    Zosyn 5/18 -current  Lines/Tubes: Rt IJ hemodialysis catheter, cholecystostomy tube, left arm PICC line, NG tube  Interval Events: Continues to have low-grade fever without obvious infective cause, WBC has remained normal throughout.  Hemodynamically stable   Assessment Fevers - multifactorial, concern for non infective etiology  Acute acalculous cholecystitis status post cholecystostomy tube AKI Multiple Posterior circulation infarcts   Recommendations Continue Zosyn as is Doppler of upper extremities to r/o DVT  Ophthalmology consult to evaluate rt eye ? Conjunctivitis vs corneal abrasion  Would recommend to evaluate for noninfective causes of fever  Monitor CBC and BMP on antibiotics  Rest of the management as per the primary team. Thank you for the consult. Please page with pertinent questions or concerns.  ______________________________________________________________________ Subjective patient seen and examined at the bedside.  Right is bandaged  Vitals BP (!) 186/74   Pulse 93    Temp 99.9 F (37.7 C) (Bladder)   Resp (!) 23   Ht '6\' 1"'$  (1.854 m)   Wt 103.2 kg   SpO2 98%   BMI 30.02 kg/m     Physical Exam Constitutional:  Not in acute distress    Comments: Rt eye is bandaged, rt eye is pink   Cardiovascular:     Rate and Rhythm: Normal rate and regular rhythm.     Heart sounds:   Pulmonary:     Effort: Pulmonary effort is normal.     Comments: s/p tracheostomy tube   Abdominal:     Palpations: Abdomen is soft, distended , cholecystostomy tube, flexi-seal+    Tenderness: non tender   Musculoskeletal:        General: No swelling or tenderness of joints/extremities   Skin:    Comments: has a stage 1 ulcer at the lower back - doesn't look infected ( skin breakdown)  Neurological:     General: unable to assess  Psychiatric:        Mood and Affect: could not assess   Pertinent Microbiology Results for orders placed or performed during the hospital encounter of 07/10/2020  Resp Panel by RT-PCR (Flu A&B, Covid) Nasopharyngeal Swab     Status: None   Collection Time: 07/29/2020  1:11 PM   Specimen: Nasopharyngeal Swab; Nasopharyngeal(NP) swabs in vial transport medium  Result Value Ref Range Status   SARS Coronavirus 2 by RT PCR NEGATIVE NEGATIVE Final    Comment: (NOTE) SARS-CoV-2 target nucleic acids are NOT DETECTED.  The SARS-CoV-2 RNA is generally detectable in upper respiratory specimens during the acute phase  of infection. The lowest concentration of SARS-CoV-2 viral copies this assay can detect is 138 copies/mL. A negative result does not preclude SARS-Cov-2 infection and should not be used as the sole basis for treatment or other patient management decisions. A negative result may occur with  improper specimen collection/handling, submission of specimen other than nasopharyngeal swab, presence of viral mutation(s) within the areas targeted by this assay, and inadequate number of viral copies(<138 copies/mL). A negative result must be  combined with clinical observations, patient history, and epidemiological information. The expected result is Negative.  Fact Sheet for Patients:  EntrepreneurPulse.com.au  Fact Sheet for Healthcare Providers:  IncredibleEmployment.be  This test is no t yet approved or cleared by the [place] FDA and  has been authorized for detection and/or diagnosis of SARS-CoV-2 by FDA under an Emergency Use Authorization (EUA). This EUA will remain  in effect (meaning this test can be used) for the duration of the COVID-19 declaration under Section 564(b)(1) of the Act, 21 U.S.C.section 360bbb-3(b)(1), unless the authorization is terminated  or revoked sooner.       Influenza A by PCR NEGATIVE NEGATIVE Final   Influenza B by PCR NEGATIVE NEGATIVE Final    Comment: (NOTE) The Xpert Xpress SARS-CoV-2/FLU/RSV plus assay is intended as an aid in the diagnosis of influenza from Nasopharyngeal swab specimens and should not be used as a sole basis for treatment. Nasal washings and aspirates are unacceptable for Xpert Xpress SARS-CoV-2/FLU/RSV testing.  Fact Sheet for Patients: EntrepreneurPulse.com.au  Fact Sheet for Healthcare Providers: IncredibleEmployment.be  This test is not yet approved or cleared by the [place] FDA and has been authorized for detection and/or diagnosis of SARS-CoV-2 by FDA under an Emergency Use Authorization (EUA). This EUA will remain in effect (meaning this test can be used) for the duration of the COVID-19 declaration under Section 564(b)(1) of the Act, 21 U.S.C. section 360bbb-3(b)(1), unless the authorization is terminated or revoked.  Performed at [facility], [place] [address] [place], [place] [address]   Surgical pcr screen     Status: None   Collection Time: 07/24/20  7:58 PM   Specimen: Nasal Mucosa; Nasal Swab  Result Value Ref Range Status   MRSA, PCR NEGATIVE  NEGATIVE Final   Staphylococcus aureus NEGATIVE NEGATIVE Final    Comment: (NOTE) The Xpert SA Assay (FDA approved for NASAL specimens in patients 14 years of age and older), is one component of a comprehensive surveillance program. It is not intended to diagnose infection nor to guide or monitor treatment. Performed at [facility], [place] [address], [place], [place] [address]   Culture, blood (routine x 2)     Status: None   Collection Time: 07/27/20  8:18 AM   Specimen: BLOOD  Result Value Ref Range Status   Specimen Description BLOOD RIGHT ANTECUBITAL  Final   Special Requests   Final    BOTTLES DRAWN AEROBIC AND ANAEROBIC Blood Culture adequate volume   Culture   Final    NO GROWTH 5 DAYS Performed at [facility], [place] [address], [place], [place] [address]    Report Status 08/01/2020 FINAL  Final  Culture, blood (routine x 2)     Status: None   Collection Time: 07/27/20  8:25 AM   Specimen: BLOOD RIGHT HAND  Result Value Ref Range Status   Specimen Description BLOOD RIGHT HAND  Final   Special Requests   Final    BOTTLES DRAWN AEROBIC AND ANAEROBIC Blood  Culture adequate volume   Culture   Final    NO GROWTH 5 DAYS Performed at [facility], [place] [address], [place], [place] [address]    Report Status 08/01/2020 FINAL  Final  Culture, Respiratory w Gram Stain     Status: None   Collection Time: 07/27/20  2:42 PM   Specimen: Tracheal Aspirate; Respiratory  Result Value Ref Range Status   Specimen Description TRACHEAL ASPIRATE  Final   Special Requests NONE  Final   Gram Stain   Final    RARE WBC PRESENT, PREDOMINANTLY PMN RARE GRAM NEGATIVE RODS    Culture   Final    RARE Normal respiratory flora-no Staph aureus or Pseudomonas seen Performed at [facility], [address], [place], [place] [address]    Report Status 07/30/2020 FINAL  Final  Culture, Respiratory w Gram Stain     Status: None   Collection Time: 08/06/20  5:13 PM   Specimen:  Tracheal Aspirate; Respiratory  Result Value Ref Range Status   Specimen Description TRACHEAL ASPIRATE  Final   Special Requests NONE  Final   Gram Stain   Final    ABUNDANT WBC PRESENT,BOTH PMN AND MONONUCLEAR FEW GRAM POSITIVE COCCI IN PAIRS MODERATE GRAM NEGATIVE RODS    Culture   Final    MODERATE Normal respiratory flora-no Staph aureus or Pseudomonas seen Performed at [facility], [address], [place], [place] [address]    Report Status 08/06/2020 FINAL  Final  Culture, blood (routine x 2)     Status: None   Collection Time: 08/12/20  3:20 PM   Specimen: BLOOD RIGHT HAND  Result Value Ref Range Status   Specimen Description BLOOD RIGHT HAND  Final   Special Requests   Final    BOTTLES DRAWN AEROBIC AND ANAEROBIC Blood Culture adequate volume   Culture   Final    NO GROWTH 5 DAYS Performed at [facility], [place] [address], [place], [place] [address]    Report Status 08/17/2020 FINAL  Final  Culture, blood (routine x 2)     Status: None   Collection Time: 08/12/20  3:20 PM   Specimen: BLOOD LEFT HAND  Result Value Ref Range Status   Specimen Description BLOOD LEFT HAND  Final   Special Requests   Final    BOTTLES DRAWN AEROBIC AND ANAEROBIC Blood Culture adequate volume   Culture   Final    NO GROWTH 5 DAYS Performed at [facility], [place] [address], [place], [place] [address]    Report Status 08/17/2020 FINAL  Final  Culture, blood (routine x 2)     Status: None   Collection Time: 08/14/20  8:55 AM   Specimen: BLOOD  Result Value Ref Range Status   Specimen Description BLOOD RIGHT ANTECUBITAL  Final   Special Requests   Final    BOTTLES DRAWN AEROBIC AND ANAEROBIC Blood Culture adequate volume   Culture   Final    NO GROWTH 6 DAYS Performed at [facility], [place] [address] [place], [place] [address]    Report Status 08/20/2020 FINAL  Final  Culture, blood (routine x 2)     Status: None   Collection Time: 08/14/20  8:55 AM   Specimen:  BLOOD  Result Value Ref Range Status   Specimen Description BLOOD LEFT ANTECUBITAL  Final   Special Requests   Final    BOTTLES DRAWN AEROBIC AND ANAEROBIC Blood Culture  adequate volume   Culture   Final    NO GROWTH 6 DAYS Performed at [facility], [place] [address], [place], [place] [address]    Report Status 08/20/2020 FINAL  Final  Aerobic/Anaerobic Culture w Gram Stain (surgical/deep wound)     Status: None (Preliminary result)   Collection Time: 08/16/20  4:30 PM   Specimen: PATH Gallbladder; Tissue  Result Value Ref Range Status   Specimen Description FLUID GALLBLADDER  Final   Special Requests NONE  Final   Gram Stain   Final    ABUNDANT WBC PRESENT,BOTH PMN AND MONONUCLEAR NO ORGANISMS SEEN    Culture   Final    NO GROWTH 4 DAYS NO ANAEROBES ISOLATED; CULTURE IN PROGRESS FOR 5 DAYS Performed at [facility], [place] [address], [place], [place] [address]    Report Status PENDING  Incomplete  Aerobic/Anaerobic Culture w Gram Stain (surgical/deep wound)     Status: None (Preliminary result)   Collection Time: 08/16/2020  8:14 AM   Specimen: Wound  Result Value Ref Range Status   Specimen Description WOUND AXILLARY GRAFT FROM IMPELLA [place] A  Final   Special Requests NONE  Final   Gram Stain   Final    RARE WBC PRESENT, PREDOMINANTLY MONONUCLEAR NO ORGANISMS SEEN    Culture   Final    NO GROWTH 2 DAYS NO ANAEROBES ISOLATED; CULTURE IN PROGRESS FOR 5 DAYS Performed at [facility], [place] [address] [place], [place] [address]    Report Status PENDING  Incomplete    Pertinent Lab. CBC Latest Ref Rng & Units 08/21/2020 08/20/2020 08/20/2020  WBC 4.0 - 10.5 K/uL 8.5 8.0 -  Hemoglobin 13.0 - 17.0 g/dL 9.0(L) 8.0(L) 7.8(L)  Hematocrit 39.0 - 52.0 % 29.3(L) 26.4(L) 23.0(L)  Platelets 150 - 400 K/uL 408(H) 370 -   CMP Latest Ref Rng & Units 08/21/2020 08/20/2020 08/20/2020  Glucose 70 - 99 mg/dL 267(H) 266(H) 217(H)  BUN 8 - 23 mg/dL 74(H) 78(H) 83(H)  Creatinine  0.61 - 1.24 mg/dL 2.45(H) 2.61(H) 2.82(H)  Sodium 135 - 145 mmol/L 149(H) 149(H) 149(H)  Potassium 3.5 - 5.1 mmol/L 4.1 4.1 4.1  Chloride 98 - 111 mmol/L 116(H) 116(H) 115(H)  CO2 22 - 32 mmol/L '24 23 25  '$ Calcium 8.9 - 10.3 mg/dL 8.3(L) 8.3(L) 8.0(L)  Total Protein 6.5 - 8.1 g/dL - - 6.0(L)  Total Bilirubin 0.3 - 1.2 mg/dL - - 1.3(H)  Alkaline Phos 38 - 126 U/L - - 291(H)  AST 15 - 41 U/L - - 61(H)  ALT 0 - 44 U/L - - 44     Pertinent Imaging today Plain films and CT images have been personally visualized and interpreted; radiology reports have been reviewed. Decision making incorporated into the Impression / Recommendations.  Chest Xray 08/21/20  FINDINGS: Tracheostomy tube appears to be appropriately positioned. Impella left ventricular assist device has been removed. Right jugular central line is in the upper SVC region and stable. Left arm PICC line tip in the SVC and stable. Left subclavian central line has been removed. Heart remains enlarged. There is a feeding tube that extends into the abdomen but the tip is beyond the image. Slightly prominent interstitial lung densities bilaterally. Old left rib fractures. Again noted are multiple metallic densities overlying the left lower chest.  IMPRESSION: 1. Slightly increased interstitial lung densities. Findings could represent mild edema. 2. Support apparatuses as described.  I have spent more than 35 minutes for this patient  encounter including review of prior medical records, coordination of care  with greater than 50% of time being face to face/counseling and discussing diagnostics/treatment plan with the patient/family.  Electronically signed by:   Rosiland Oz, MD Infectious Disease Physician [facility] for Infectious Disease Pager: [phone]

## 2020-08-21 NOTE — Progress Notes (Signed)
3 Days Post-Op Procedure(s) (LRB): REMOVAL OF IMPELLA LEFT VENTRICULAR ASSIST DEVICE (N/A) TRANSESOPHAGEAL ECHOCARDIOGRAM (TEE) (N/A) Subjective:unable to state clearly  Objective: Vital signs in last 24 hours: Temp:  [99.68 F (37.6 C)-100.94 F (38.3 C)] 99.9 F (37.7 C) (05/23 0751) Pulse Rate:  [79-107] 93 (05/23 0746) Cardiac Rhythm: Atrial fibrillation (05/22 2000) Resp:  [12-34] 23 (05/23 0746) BP: (138-186)/(65-98) 186/74 (05/23 0700) SpO2:  [98 %-100 %] 98 % (05/23 0746) Arterial Line BP: (109-193)/(53-107) 111/107 (05/22 1700) FiO2 (%):  [40 %] 40 % (05/23 0746) Weight:  [103.2 kg] 103.2 kg (05/23 0600)  Hemodynamic parameters for last 24 hours: CVP:  [12 mmHg-19 mmHg] 15 mmHg  Intake/Output from previous day: 05/22 0701 - 05/23 0700 In: 4273.8 [I.V.:868.9; NG/GT:3220; IV Piggyback:149.9] Out: [id number] [Urine:2975; Drains:375; Stool:325] Intake/Output this shift: No intake/output data recorded.  General appearance: no distress Neurologic:  MAE Heart: regular rate and rhythm, S1, S2 normal, no murmur, click, rub or gallop Lungs: clear to auscultation bilaterally Abdomen: soft, non-tender; bowel sounds normal; no masses,  no organomegaly Extremities: edema mild Wound: intact  Lab Results: Recent Labs    08/20/20 0425 08/21/20 0025  WBC 8.0 8.5  HGB 8.0* 9.0*  HCT 26.4* 29.3*  PLT 370 408*   BMET:  Recent Labs    08/20/20 1545 08/21/20 0025  NA 149* 149*  K 4.1 4.1  CL 116* 116*  CO2 23 24  GLUCOSE 266* 267*  BUN 78* 74*  CREATININE 2.61* 2.45*  CALCIUM 8.3* 8.3*    PT/INR: No results for input(s): LABPROT, INR in the last 72 hours. ABG    Component Value Date/Time   PHART 7.432 08/20/2020 0420   HCO3 25.8 08/20/2020 0420   TCO2 27 08/20/2020 0420   ACIDBASEDEF 2.0 08/05/2020 0400   O2SAT 74.3 08/21/2020 0334   CBG (last 3)  Recent Labs    08/21/20 0005 08/21/20 0333 08/21/20 0749  GLUCAP 248* 231* 241*    Assessment/Plan: S/P  Procedure(s) (LRB): REMOVAL OF IMPELLA LEFT VENTRICULAR ASSIST DEVICE (N/A) TRANSESOPHAGEAL ECHOCARDIOGRAM (TEE) (N/A) consider LTAC referral   LOS: 32 days    Wonda Olds 08/21/2020

## 2020-08-21 NOTE — Progress Notes (Signed)
Patient transported from MRI to 2H13 without incidence.

## 2020-08-21 NOTE — Progress Notes (Signed)
    Progress Note from the Palliative Medicine Team at [facility]   Patient Name: Danny Carpenter        Date: 08/21/2020 DOB: [birth year]  Age: 70 y.o. MRN#: [id number] Attending Physician: Wonda Olds, MD Primary Care Physician: Serita Grammes, MD Admit Date: 06/30/2020   Medical records reviewed   70 y.o. male  admitted on 07/10/2020 with  past medical history significant for hypertension, hyperlipidemia, type 2 diabetes, CVA, AAA, left foot amputation s/p fem pop bypass and heart failure who presented to the emergency department 4/21 after undergoing outpatient stress test that revealed EF of 20% and acute ischemia.  Since admission patient was evaluated by cardiology and cardiothoracic surgery and patient underwent TEE and left heart cath. LHC/RHC revealed severe three-vessel coronary artery disease with moderately elevated left heart and pulmonary artery pressures. At that time patient was placed on goal-directed medical therapy for acute systolic congestive heart failure and CTS surgery was consulted for CABG potential.  Patient underwent CABG x5 with Dr. Orvan Seen 4/26, post CABG CODE BLUE called due to low flow on Impella, CTS notified. Patient underwent bedside exploratory postoperative open heart where Dr. Julien Girt reopened the chest incision and hematoma was removed, patient's hemodynamics improved upon opening sternotomy.  PCCM consulted 4/28 due to difficulty weaning ventilator.  He was trached  08-07-20  CRRT initiated on 08/01/2020, currently on hold.       Head CT on 08/04/2020 significant for multiple areas of posterior circulation infarct.  08-12-20 30 minutes in [place] until paced out, code status reversed  08-14-20 persistent fevers.  ID consulted, pan CT.    08-17-20-creatinine improved, still making urine but slowly   HD on hold for now.  08-21-20-removal of Impella  Today is day 31 of this hospitalization.  Patient is off sedation and unable to follow commands.   He remains vent dependent.  Biliary drain in place.  Family remain hopeful for  Improvement.  Returned call today to Kimberly/daughter-in-law.  Updated on current medical situation and answered questions to the best of my ability.  Family continue to face ongoing treatment option decisions, advanced directive decisions and anticipatory care needs decisions.  Family is understandably concerned with patient's neurologic state.  They are hoping that an MRI will give him the information they need regarding decisions.     Education offered on the significance of poor neurologic examination,  patient has been off sedation, continues to be unable to follow commands  I spoke with Levonne Spiller, RN with cardiothoracic.  Plan is to remove pacer wires and obtain MRI, neurology reconsulted.  I left a phone message for Joelene Millin regarding the above information.  I will call her tomorrow morning to hopefully schedule a time for family to gather and readdress goals of care.  PMT will continue to support holistically.    Total time spent on the unit was  45 minutes  Greater than 50% of the time was spent in counseling and coordination of care  Wadie Lessen NP  Palliative Medicine Team Team Phone # [phone] Pager [phone]

## 2020-08-21 NOTE — Progress Notes (Signed)
EVENING ROUNDS NOTE :     [place]       [place] [address]             [phone]                 3 Days Post-Op Procedure(s) (LRB): REMOVAL OF IMPELLA LEFT VENTRICULAR ASSIST DEVICE (N/A) TRANSESOPHAGEAL ECHOCARDIOGRAM (TEE) (N/A)   Total Length of Stay:  LOS: 32 days  Events:   No events     BP (!) 185/86   Pulse 96   Temp (!) 100.4 F (38 C)   Resp (!) 23   Ht '6\' 1"'$  (1.854 m)   Wt 103.2 kg   SpO2 100%   BMI 30.02 kg/m   CVP:  [12 mmHg-19 mmHg] 15 mmHg  Vent Mode: PRVC FiO2 (%):  [40 %] 40 % Set Rate:  [20 bmp] 20 bmp Vt Set:  [630 mL] 630 mL PEEP:  [5 cmH20] 5 cmH20 Plateau Pressure:  [15 cmH20-28 cmH20] 22 cmH20  . sodium chloride Stopped (08/04/2020 0734)  . sodium chloride    . sodium chloride    . sodium chloride 20 mL/hr at 08/21/20 0700  . dextrose Stopped (08/17/20 1116)  . feeding supplement (VITAL 1.5 CAL) 55 mL/hr at 08/20/20 1900  . heparin 2,250 Units/hr (08/21/20 1309)  . piperacillin-tazobactam (ZOSYN)  IV 3.375 g (08/21/20 1541)    I/O last 3 completed shifts: In: 4820.1 [I.V.:1286.1; Other:35; NG/GT:3280; IV Piggyback:218.9] Out: [id number] [Urine:4475; Drains:550; Stool:475]   CBC Latest Ref Rng & Units 08/21/2020 08/20/2020 08/20/2020  WBC 4.0 - 10.5 K/uL 8.5 8.0 -  Hemoglobin 13.0 - 17.0 g/dL 9.0(L) 8.0(L) 7.8(L)  Hematocrit 39.0 - 52.0 % 29.3(L) 26.4(L) 23.0(L)  Platelets 150 - 400 K/uL 408(H) 370 -    BMP Latest Ref Rng & Units 08/21/2020 08/20/2020 08/20/2020  Glucose 70 - 99 mg/dL 267(H) 266(H) 217(H)  BUN 8 - 23 mg/dL 74(H) 78(H) 83(H)  Creatinine 0.61 - 1.24 mg/dL 2.45(H) 2.61(H) 2.82(H)  Sodium 135 - 145 mmol/L 149(H) 149(H) 149(H)  Potassium 3.5 - 5.1 mmol/L 4.1 4.1 4.1  Chloride 98 - 111 mmol/L 116(H) 116(H) 115(H)  CO2 22 - 32 mmol/L '24 23 25  '$ Calcium 8.9 - 10.3 mg/dL 8.3(L) 8.3(L) 8.0(L)    ABG    Component Value Date/Time   PHART 7.432 08/20/2020 0420   PCO2ART 39.0 08/20/2020 0420   PO2ART 134  (H) 08/20/2020 0420   HCO3 25.8 08/20/2020 0420   TCO2 27 08/20/2020 0420   ACIDBASEDEF 2.0 08/05/2020 0400   O2SAT 74.3 08/21/2020 0334       Melodie Bouillon, MD 08/21/2020 5:41 PM

## 2020-08-21 NOTE — Progress Notes (Addendum)
Patient ID: Danny Carpenter, male   DOB: Nov 22, 1950, 70 y.o.   MRN: [id number]     Advanced Heart Failure Rounding Note  PCP-Cardiologist: None   Subjective:    - 4/26 S/P CABG - 5/4 Swan removed with ongoing fevers, CVVH started - CT head on 5/6 with multiple posterior circulation infarcts.  - 5/10 tracheostomy. CT head similar to prior with multiple small infarcts.  - 5/14 Torsades -> paced out by Dr. Orvan Seen. Impella turned back up from P3 -> P7  after code. Amio gtt switched to lidocaine.   - 5/16 Eraxis added for persistent fevers. Cultures resent>>pending  - 5/17 CT chest/abdomen/pelvis and abdominal US suggestive of acalculous cholecystitis - 5/18 cholecystostomy tube - 5/20 Return to the OR today for Impella removal. TEE with EF 30%-->started on epi 2 mcg with improved EF to 50%.   Continues on mexiletine, remains in AF but no VT.   CO-OX 74% on milrinone 0.125 mcg.    Tm 100.9 , remains on Zosyn.  Has biliary drain. WBCs 8. 5 .    Hgb 9.   Remains on vent--trach. No purposeful movement.    Objective:   Weight Range: 103.2 kg Body mass index is 30.02 kg/m.   Vital Signs:   Temp:  [99.68 F (37.6 C)-100.94 F (38.3 C)] 99.9 F (37.7 C) (05/23 0751) Pulse Rate:  [79-107] 89 (05/23 0745) Resp:  [12-34] 21 (05/23 0745) BP: (138-186)/(65-98) 186/74 (05/23 0700) SpO2:  [98 %-100 %] 100 % (05/23 0745) Arterial Line BP: (109-193)/(53-107) 111/107 (05/22 1700) FiO2 (%):  [40 %] 40 % (05/23 0745) Weight:  [103.2 kg] 103.2 kg (05/23 0600) Last BM Date: 08/20/20  Weight change: Filed Weights   08/17/20 0500 08/05/2020 0500 08/21/20 0600  Weight: 114.1 kg 108.9 kg 103.2 kg    Intake/Output:   Intake/Output Summary (Last 24 hours) at 08/21/2020 0800 Last data filed at 08/21/2020 0700 Gross per 24 hour  Intake 2105.45 ml  Output 3400 ml  Net -1294.55 ml      Physical Exam  CVP 9-10  General:  On vent /trach HEENT: R eye patch. + Cortrak Neck: Trach, supple. JVP  difficult to assess. . Carotids 2+ bilat; no bruits. No lymphadenopathy or thryomegaly appreciated. Cor: PMI nondisplaced. Irregular rate & rhythm. No rubs, gallops or murmurs. Lungs: Coarse throughout.  Abdomen: soft, nontender, nondistended. No hepatosplenomegaly. No bruits or masses. Good bowel sounds. Biliary drain Extremities: no cyanosis, clubbing, rash, edema. LUE PICC  Neuro: Opens eyes no purposeful movement.   Telemetry  A fib 70-100s   Labs    CBC Recent Labs    08/20/20 0425 08/21/20 0025  WBC 8.0 8.5  HGB 8.0* 9.0*  HCT 26.4* 29.3*  MCV 94.6 95.1  PLT 370 264*   Basic Metabolic Panel Recent Labs    08/20/20 1545 08/21/20 0025  NA 149* 149*  K 4.1 4.1  CL 116* 116*  CO2 23 24  GLUCOSE 266* 267*  BUN 78* 74*  CREATININE 2.61* 2.45*  CALCIUM 8.3* 8.3*   Liver Function Tests Recent Labs    08/19/20 0450 08/20/20 0425  AST 33 61*  ALT 31 44  ALKPHOS 305* 291*  BILITOT 1.0 1.3*  PROT 5.9* 6.0*  ALBUMIN 1.7* 1.7*   No results for input(s): LIPASE, AMYLASE in the last 72 hours. Cardiac Enzymes No results for input(s): CKTOTAL, CKMB, CKMBINDEX, TROPONINI in the last 72 hours.  BNP: BNP (last 3 results) Recent Labs    07/26/2020 1211  BNP  465.5*    ProBNP (last 3 results) No results for input(s): PROBNP in the last 8760 hours.   D-Dimer No results for input(s): DDIMER in the last 72 hours. Hemoglobin A1C No results for input(s): HGBA1C in the last 72 hours. Fasting Lipid Panel No results for input(s): CHOL, HDL, LDLCALC, TRIG, CHOLHDL, LDLDIRECT in the last 72 hours. Thyroid Function Tests No results for input(s): TSH, T4TOTAL, T3FREE, THYROIDAB in the last 72 hours.  Invalid input(s): FREET3  Other results:   Imaging    No results found.   Medications:     Scheduled Medications: . aspirin EC  325 mg Oral Daily   Or  . aspirin  324 mg Per Tube Daily  . chlorhexidine gluconate (MEDLINE KIT)  15 mL Mouth Rinse BID  .  Chlorhexidine Gluconate Cloth  6 each Topical Daily  . darbepoetin (ARANESP) injection - NON-DIALYSIS  150 mcg Subcutaneous Q Wed-1800  . feeding supplement (PROSource TF)  45 mL Per Tube 5 X Daily  . ferrous [id number] C-folic acid  1 capsule Oral BID PC  . fiber  1 packet Per Tube BID  . free water  200 mL Per Tube Q6H  . Gerhardt's butt cream   Topical QID  . hydrALAZINE  12.5 mg Per Tube Q8H  . insulin aspart  0-15 Units Subcutaneous Q4H  . insulin detemir  20 Units Subcutaneous BID  . isosorbide dinitrate  10 mg Per Tube TID  . levalbuterol  0.63 mg Nebulization TID  . mouth rinse  15 mL Mouth Rinse 10 times per day  . mexiletine  200 mg Per Tube Q12H  . pantoprazole sodium  40 mg Per Tube Daily  . rosuvastatin  10 mg Per Tube Daily  . sodium chloride flush  10-40 mL Intracatheter Q12H  . sodium chloride flush  5 mL Intracatheter Q8H    Infusions: . sodium chloride Stopped (08/07/2020 0734)  . sodium chloride    . sodium chloride    . sodium chloride 20 mL/hr at 08/21/20 0700  . dextrose Stopped (08/17/20 1116)  . feeding supplement (VITAL 1.5 CAL) 55 mL/hr at 08/20/20 1900  . heparin 2,250 Units/hr (08/21/20 0700)  . milrinone 0.125 mcg/kg/min (08/21/20 0700)  . piperacillin-tazobactam (ZOSYN)  IV Stopped (08/21/20 0318)    PRN Medications: sodium chloride, Place/Maintain arterial line **AND** sodium chloride, acetaminophen (TYLENOL) oral liquid 160 mg/5 mL, fentaNYL (SUBLIMAZE) injection, polyvinyl alcohol, sodium chloride flush  Assessment/Plan   1. Cardiogenic shock: Ischemic cardiomyopathy, post-CABG on 4/26.  He has Impella 5.5 in place, down to P4 with no alarms and stable LDH.  Limited echo 4/29 with EF< 20%, the RV appears normal in size with severe systolic dysfunction.  Echo with some improvement, EF in 30-35% range on 5/9 echo. Suspect component of septic/distributive shock. Impella out 5/20. EF 30% intraop TEE 5/20.   - CO-OX 74%. Stop milrinone.  -  Stop hydralazine/isordil.  - Start bidil 1 tab tid.  - No diuretic today.  2. CAD: s/p CABG x 5 with LIMA-LAD, seq SVG-D1/ramus, seq SVG-PDA/PLV.   - ASA  - Crestor 3. Anemia: Post-op bleeding, back to OR with multiple products given on 4/26 post-CABG.  Hgb 8, no overt bleeding. Got 1 unit PRBCs on 5/21.  4. Thrombocytopenia: Resolved. suspect low post-op/post-surgical bleeding and multiple blood products as well as sepsis.  5. PAD: Extensive history.  6. AAA: Monitoring as outpatient. 4.5 cm on CT.  7. Type 2 DM: Insulin.  Hgb A1C 11.6, poor  control. On SSI  8. Atrial fibrillation: Rate controlled.  Off amio with torsades and possible QT prolongation.  - Heparin gtt.  9. Neuro: intubated and sedated, not following commands with sedation wean.  Head CT 5/6 with multiple posterior circulation infarcts. Head CT 5/10 no change. Eyes open and withdraws from pain. NH3 was not elevated.  - Neuro has seen.  - Palliative Care following.  - Eventually needs MRI head when pacing wires out.  10. ID:  PCT 7.5 on 4/29. Cultures so far negative.  Now with cholecystostomy drain for acalculous cholecystitis. Off Eraxis now and on Zosyn for biliary coverage. ID following.  Persistent fevers, Tm 100.8 , WBCs 8.  Lines all have been changed out.  - Continue Zosyn.  - ?Noninfectious fever  - ID following.  11. Acute hypoxemic respiratory failure: S/p tracheostomy on 5/10.  - trach management per PCCM  12. AKI:  CVVH started with marked volume overload and worsening renal function.  Now off CVVH with good UOP and creatinine gradually decreasing.  Creatinine 2.45.  13. FEN: Tube feeds, will need PEG.  14. Torsades: Paced out by Dr Orvan Seen on 5/14. Amiodarone stopped.  - continue Mexiletine  15. GOC: was limited code and now changed back to Full Code 16. Acute acalculous cholecystitis: S/p biliary drain placement.   - Zosyn for biliary coverage.  17. Red eye: Right eye red with copious yellow-green drainage.    May need opthamology per primary team.  18. Hypernatremia: Na 149.  - Free water boluses 200 cc q 6hrs.    Amy Clegg  NP-C  8:00 AM  Patient seen with NP, agree with the above note.   Co-ox 74%, CVP 10-11. BP elevated, on hydralazine/Imdur at low dose. I/Os positive yesterday.   Eyes open, appears to track some.    Creatinine lower at 2.45.   General: NAD Neck: Trach. JVP 9-10, no thyromegaly or thyroid nodule.  Lungs: Clear to auscultation bilaterally with normal respiratory effort. CV: Nondisplaced PMI.  Heart regular S1/S2, no S3/S4, no murmur.  No peripheral edema.  Abdomen: Soft, nontender, no hepatosplenomegaly, no distention.  Skin: Intact without lesions or rashes.  Neurologic: Eyes open, appears to track.  Extremities: No clubbing or cyanosis.  HEENT: Normal.   Still with periodic low grade fevers, on Zosyn with biliary drain.  ?Noninfectious.  ID following.   Stop milrinone today.  Change hydralazine/isordil to Bidil 1 tab tid.   CVP 10-11, will give 1 dose of Lasix 60 mg IV x 1.  Creatinine lower at 2.45.    CRITICAL CARE Performed by: Loralie Champagne  Total critical care time: 35 minutes  Critical care time was exclusive of separately billable procedures and treating other patients.  Critical care was necessary to treat or prevent imminent or life-threatening deterioration.  Critical care was time spent personally by me on the following activities: development of treatment plan with patient and/or surrogate as well as nursing, discussions with consultants, evaluation of patient's response to treatment, examination of patient, obtaining history from patient or surrogate, ordering and performing treatments and interventions, ordering and review of laboratory studies, ordering and review of radiographic studies, pulse oximetry and re-evaluation of patient's condition.  Loralie Champagne 08/21/2020 10:50 AM

## 2020-08-21 NOTE — Progress Notes (Signed)
ANTICOAGULATION CONSULT NOTE  Pharmacy Consult for Heparin Indication: Atrial fibrillation  No Known Allergies  Patient Measurements: Height: '6\' 1"'$  (185.4 cm) Weight: 108.9 kg (240 lb 1.3 oz) IBW/kg (Calculated) : 79.9 Heparin Dosing Weight: 102.8 kg  Vital Signs: Temp: 100.4 F (38 C) (05/23 0000) Temp Source: Bladder (05/22 1600) BP: 173/72 (05/23 0100) Pulse Rate: 96 (05/23 0100)  Labs: Recent Labs    08/19/20 0450 08/19/20 1322 08/19/20 2351 08/20/20 0420 08/20/20 0425 08/20/20 1102 08/20/20 1545 08/21/20 0025  HGB 7.0*  --   --  7.8* 8.0*  --   --  9.0*  HCT 22.9*  --   --  23.0* 26.4*  --   --  29.3*  PLT 323  --   --   --  370  --   --  408*  HEPARINUNFRC <0.10*   < > 0.17*  --   --  0.12*  --  0.38  CREATININE 3.52*  --   --   --  2.82*  --  2.61* 2.45*   < > = values in this interval not displayed.    Estimated Creatinine Clearance: 37.3 mL/min (A) (by C-G formula based on SCr of 2.45 mg/dL (H)).   Assessment: 70 yo male s/p CABG with Impella support,with Afib for heparin  Goal of Therapy:  Heparin level 0.3-0.5 units/ml Monitor platelets by anticoagulation protocol: Yes   Plan:  Continue Heparin at current rate   Phillis Knack, PharmD, BCPS   08/21/2020 1:58 AM

## 2020-08-21 NOTE — Progress Notes (Signed)
PCCM progress note  Asked by attending physician to help coordinate ophthalmology consult for right eye irritation and redness.  Per chart review it appears patient has had issues with red irritated for the last several days.  We have attempted to cover and instil lubricating drops but right eye continues to remain red with possible drainage concerning for corneal abrasion and or infection versus severe irritation in the setting of significant dryness.  On assessment patient's right eye is covered with dry dressing.  On removal of dressing eye is partially open with thick film covering pupil and sclera.  Difficult to assess pupillary constriction given presence of film.  See image below    Continue to cover to prevent any further dryness.  We will also add lubricating eye ointment to further combat dryness.  Eye precautions also ordered.  Ophthalmology will also be consulted.  Johnsie Cancel, NP-C [facility] Personal contact information can be found on Amion  08/21/2020, 12:16 PM

## 2020-08-21 NOTE — Progress Notes (Signed)
Inpatient Diabetes Program Recommendations  AACE/ADA: New Consensus Statement on Inpatient Glycemic Control (2015)  Target Ranges:  Prepandial:   less than 140 mg/dL      Peak postprandial:   less than 180 mg/dL (1-2 hours)      Critically ill patients:  140 - 180 mg/dL   Lab Results  Component Value Date   GLUCAP 247 (H) 08/21/2020   HGBA1C 11.6 (H) 06/30/2020    Review of Glycemic Control Results for XAIDEN, LEARN (MRN [id number]) as of 08/21/2020 18:25  Ref. Range 08/21/2020 00:05 08/21/2020 03:33 08/21/2020 07:49 08/21/2020 11:28 08/21/2020 15:21  Glucose-Capillary Latest Ref Range: 70 - 99 mg/dL 248 (H) 231 (H) 241 (H) 240 (H) 247 (H)    Inpatient Diabetes Program Recommendations:    Levemir 23 units BID  Will continue to follow while inpatient.  Thank you, Reche Dixon, RN, BSN Diabetes Coordinator Inpatient Diabetes Program [phone] (team pager from 8a-5p)

## 2020-08-21 NOTE — Progress Notes (Signed)
Patient transported to MRI 3 without incidence.

## 2020-08-21 NOTE — Progress Notes (Signed)
PCCM progress note  Called and spoke to Dr. Valetta Close, Ophthalmologic regarding further recommendations for eye care. Per description and clinical data he feels that the right eye is irrigated from excessive dryness. He stated that a dry gaze should NOT be applied directly to the eye as it will worsen the irritation. If the concern for dryness due to inability to affectly close the lid he recommends the lid be tape closed directly with no gauze. Additionally he recommended Erythromycin ointment every 2 hours for 3 days. If the redness and irritation isn't improved a repeat consult should be called.   Appropriate orders placed and bedside nursing updated.   Danny Cancel, NP-C [facility] Personal contact information can be found on Amion  08/21/2020, 2:14 PM

## 2020-08-22 DIAGNOSIS — Z9911 Dependence on respirator [ventilator] status: Secondary | ICD-10-CM | POA: Diagnosis not present

## 2020-08-22 DIAGNOSIS — Z7189 Other specified counseling: Secondary | ICD-10-CM | POA: Diagnosis not present

## 2020-08-22 DIAGNOSIS — Z515 Encounter for palliative care: Secondary | ICD-10-CM | POA: Diagnosis not present

## 2020-08-22 DIAGNOSIS — R570 Cardiogenic shock: Secondary | ICD-10-CM | POA: Diagnosis not present

## 2020-08-22 DIAGNOSIS — K810 Acute cholecystitis: Secondary | ICD-10-CM

## 2020-08-22 DIAGNOSIS — Z951 Presence of aortocoronary bypass graft: Secondary | ICD-10-CM | POA: Diagnosis not present

## 2020-08-22 DIAGNOSIS — I5043 Acute on chronic combined systolic (congestive) and diastolic (congestive) heart failure: Secondary | ICD-10-CM | POA: Diagnosis not present

## 2020-08-22 LAB — BASIC METABOLIC PANEL
Anion gap: 15 (ref 5–15)
BUN: 65 mg/dL — ABNORMAL HIGH (ref 8–23)
CO2: 23 mmol/L (ref 22–32)
Calcium: 7.8 mg/dL — ABNORMAL LOW (ref 8.9–10.3)
Chloride: 115 mmol/L — ABNORMAL HIGH (ref 98–111)
Creatinine, Ser: 2.19 mg/dL — ABNORMAL HIGH (ref 0.61–1.24)
GFR, Estimated: 32 mL/min — ABNORMAL LOW (ref >60)
Glucose, Bld: 220 mg/dL — ABNORMAL HIGH (ref 70–99)
Potassium: 3.6 mmol/L (ref 3.5–5.1)
Sodium: 153 mmol/L — ABNORMAL HIGH (ref 135–145)

## 2020-08-22 LAB — HEPARIN LEVEL (UNFRACTIONATED)
Heparin Unfractionated: >1.1 IU/mL — ABNORMAL HIGH (ref 0.30–0.70)
Heparin Unfractionated: 0.55 IU/mL (ref 0.30–0.70)

## 2020-08-22 LAB — GLUCOSE, CAPILLARY
Glucose-Capillary: 151 mg/dL — ABNORMAL HIGH (ref 70–99)
Glucose-Capillary: 178 mg/dL — ABNORMAL HIGH (ref 70–99)
Glucose-Capillary: 178 mg/dL — ABNORMAL HIGH (ref 70–99)
Glucose-Capillary: 185 mg/dL — ABNORMAL HIGH (ref 70–99)
Glucose-Capillary: 210 mg/dL — ABNORMAL HIGH (ref 70–99)
Glucose-Capillary: 252 mg/dL — ABNORMAL HIGH (ref 70–99)

## 2020-08-22 LAB — COOXEMETRY PANEL
Carboxyhemoglobin: 1.6 % — ABNORMAL HIGH (ref 0.5–1.5)
Methemoglobin: 1.1 % (ref 0.0–1.5)
O2 Saturation: 63.5 %
Total hemoglobin: 7.2 g/dL — ABNORMAL LOW (ref 12.0–16.0)

## 2020-08-22 LAB — CBC
HCT: 26.3 % — ABNORMAL LOW (ref 39.0–52.0)
Hemoglobin: 7.8 g/dL — ABNORMAL LOW (ref 13.0–17.0)
MCH: 29.7 pg (ref 26.0–34.0)
MCHC: 29.7 g/dL — ABNORMAL LOW (ref 30.0–36.0)
MCV: 100 fL (ref 80.0–100.0)
Platelets: 351 10*3/uL (ref 150–400)
RBC: 2.63 MIL/uL — ABNORMAL LOW (ref 4.22–5.81)
RDW: 19.1 % — ABNORMAL HIGH (ref 11.5–15.5)
WBC: 8.1 10*3/uL (ref 4.0–10.5)
nRBC: 0 % (ref 0.0–0.2)

## 2020-08-22 MED ORDER — PIPERACILLIN-TAZOBACTAM 3.375 G IVPB
3.3750 g | Freq: Three times a day (TID) | INTRAVENOUS | Status: DC
  Administered 2020-08-22 – 2020-08-23 (×3): 3.375 g via INTRAVENOUS
  Filled 2020-08-22 (×3): qty 50

## 2020-08-22 MED ORDER — ROSUVASTATIN CALCIUM 20 MG PO TABS: 40.0000 mg | ORAL_TABLET | Freq: Every day | ORAL | Status: DC

## 2020-08-22 MED ORDER — FUROSEMIDE 10 MG/ML IJ SOLN
60.0000 mg | Freq: Once | INTRAMUSCULAR | Status: AC
  Administered 2020-08-22: 60 mg via INTRAVENOUS
  Filled 2020-08-22: qty 6

## 2020-08-22 MED ORDER — FREE WATER
100.0000 mL | Status: DC
  Administered 2020-08-22 – 2020-08-23 (×5): 100 mL

## 2020-08-22 MED ORDER — INSULIN DETEMIR 100 UNIT/ML ~~LOC~~ SOLN
23.0000 [IU] | Freq: Two times a day (BID) | SUBCUTANEOUS | Status: DC
  Administered 2020-08-22 – 2020-08-23 (×2): 23 [IU] via SUBCUTANEOUS
  Filled 2020-08-22 (×3): qty 0.23

## 2020-08-22 MED ORDER — ISOSORB DINITRATE-HYDRALAZINE 20-37.5 MG PO TABS
2.0000 | ORAL_TABLET | Freq: Three times a day (TID) | ORAL | Status: DC
  Administered 2020-08-22 – 2020-08-23 (×4): 2
  Filled 2020-08-22 (×5): qty 2

## 2020-08-22 MED ORDER — LEVALBUTEROL HCL 0.63 MG/3ML IN NEBU
0.6300 mg | INHALATION_SOLUTION | Freq: Two times a day (BID) | RESPIRATORY_TRACT | Status: DC
  Administered 2020-08-22: 0.63 mg via RESPIRATORY_TRACT
  Filled 2020-08-22: qty 3

## 2020-08-22 MED ORDER — VITAL 1.5 CAL PO LIQD: 1000.0000 mL | ORAL | Status: DC

## 2020-08-22 MED ORDER — POTASSIUM CHLORIDE 20 MEQ PO PACK
40.0000 meq | PACK | Freq: Once | ORAL | Status: AC
  Administered 2020-08-22: 40 meq
  Filled 2020-08-22: qty 2

## 2020-08-22 MED ORDER — FREE WATER
300.0000 mL | Status: DC
  Administered 2020-08-22 (×2): 300 mL

## 2020-08-22 NOTE — Progress Notes (Signed)
NAMEZahi Carpenter, MRN:  [id number], DOB:  01-03-1951, LOS: 33 ADMISSION DATE:  07/02/2020, CONSULTATION DATE: 07/28/2018 REFERRING MD: Dr. Orvan Seen, CHIEF COMPLAINT: Ventilator dependent  History of Present Illness:  Danny Carpenter is a 70 year old male with past medical history significant for hypertension, hyperlipidemia, type 2 diabetes, CVA, AAA, left foot amputation s/p fem pop bypass and heart failure who presented to the emergency department 4/21 after undergoing outpatient stress test that revealed EF of 20% and acute ischemia.  Since admission patient has been evaluated by cardiology and cardiothoracic surgery and patient underwent TEE and left heart cath.  LHC/RHC revealed severe three-vessel coronary artery disease with moderately elevated left heart and pulmonary artery pressures.  At that time patient was placed on goal-directed medical therapy for acute systolic congestive heart failure and CTS surgery was consulted for CABG potential.  Patient underwent CABG x5 with Dr. Orvan Seen 4/26, post CABG CODE BLUE called due to low flow on Impella, CTS notified.  Patient underwent bedside exploratory postoperative open heart where Dr. Julien Girt reopened the chest incision and hematoma was removed, patient's hemodynamics improved upon opening sternotomy.  PCCM consulted morning of 4/28 due to difficulty weaning ventilator  Pertinent  Medical History  Hypertension, hyperlipidemia, type 2 diabetes, CVA, AAA, left foot amputation s/p fem pop bypass and heart failure   Significant Hospital Events: Including procedures, antibiotic start and stop dates in addition to other pertinent events   . 4/21 presented to the ED for evaluation of abnormal stress test . 4/22 left and right heart cath severe multivessel disease . 4/26 CABG x5 with hemodynamic instability post requiring brief CPR with eventual return back to the OR anastomosis bleeding was seen . 4/28 remains ventilator dependent, slowly weaning  PEEP . 5/1 remains on vent, diuresing, no ready to come off  . 5/3 started on CRRT. . 5/5 requiring sedation for vent  . 5/6 CT scan shows multiple areas of posterior circulation infarction. . 5/8 tolerating CRRT with fluid removal.  Decreasing vasopressor requirements. . 5/12 TCT today . 5/13 VT event, self converted, febrile . 5/14 spent ~30 mins in Torsades until paced out, code status reversed by Dr. Julien Girt, patient spent 30 mins with no pulsatility and no flow except for the impella.  . 5/16 starting eraxis for persistent fevers . 5/17 remains febrile; planning to pan-CT . 5/18 perc bili drain placed for acalculous cholecystitis. Eraxis stopped by ID. Marland Kitchen 5/19 fentanyl turned off. . 5/20 Impella removed . 5/22 off epinephrine drip, on pressure support weans . 5/23, tracheostomy tube in place, Impella removed, off pressors.  Interim History / Subjective:   Tracheostomy tube in place, off ventilator this morning on trach collar trial, remains unresponsive  Objective   Blood pressure 138/71, pulse 82, temperature 100.04 F (37.8 C), resp. rate (!) 21, height '6\' 1"'$  (1.854 m), weight 102.6 kg, SpO2 100 %. CVP:  [6 mmHg-10 mmHg] 9 mmHg  Vent Mode: PRVC FiO2 (%):  [40 %] 40 % Set Rate:  [20 bmp] 20 bmp Vt Set:  [630 mL] 630 mL PEEP:  [5 cmH20] 5 cmH20 Plateau Pressure:  [21 cmH20-26 cmH20] 22 cmH20   Intake/Output Summary (Last 24 hours) at 08/22/2020 1032 Last data filed at 08/22/2020 0857 Gross per 24 hour  Intake 1248.49 ml  Output 3885 ml  Net -2636.51 ml   Filed Weights   08/29/2020 0500 08/21/20 0600 08/22/20 0436  Weight: 108.9 kg 103.2 kg 102.6 kg    Examination: Gen:   Obese male, chronically  ill-appearing, tracheostomy tube in place HEENT: Right eye taped close, tracheostomy tube in place Lungs:   Clear to auscultation bilaterally CV:        Regular rate rhythm, S1-S2 Abd:   Bowel sounds present, soft nontender nondistended cholecystostomy tube in place Ext: Trace  bilateral lower extremity edema Skin:     Warm, no rash Neuro:  Opens eyes to voice and noxious stimuli.  Labs/imaging that I have personally reviewed    Sodium 153 Potassium 3.6 BUN 65 Serum creatinine 2.1 White blood cell count 8.1 Hemoglobin 7.8  Resolved Hospital Problem list     Assessment & Plan:   Acute hypoxic vent- dependent  respiratory failure  Tracheostomy dependent R pleural effusion, mild pulmonary edema  Plan: Routine trach care Trach collar trial Patient is now off all continuous vasopressor needs and tolerating trach collar trial. Consider transfer for long-term care facility.  Cardiogenic shock due to ischemic cardiomyopathy CAD s/p CABG x5 Recurrent episodes of VT Off vasopressors per heart failure service Supportive care with heart failure regimen  Acute Encephalopathy: multifactorial in setting of critical illness. Prolonged icu stay, posterior circulation CVA. Concern for anoxic brain injury during low flow state -Unable to get brain MRI due to pacing wires for further evaluation. Plan: Supportive care  Ongoing fevers- improving - HD catheter and Foley exchanged previously. Con't dialysis catheter as he is still at risk of requiring CRRT --con't biliary drain.  He was previously on Eraxis.  On Zosyn.  Follow cultures Plan: Appreciate infectious disease input Continue Zosyn  AKI Oliguria improved Follow urine output and serum creatinine  Colonic ileus, resolved -Increasing TF towards goal, monitor for intolerance. Acute blood loss anemia due to operative blood loss and critical illness Thrombocytopenia -resolved Plan: Observe for any acute sign of bleeding Follow CBC  AAA Supportive care  Hyperglycemia- controlled.  History of poorly controlled diabetes PTA. Plan: SSI plus Levemir  PAD s/p partial L foot amputation  -monitor peripheral circulation-- has been stable this week Plan: Aspirin plus statin  Best practice   Diet:   Tube Feed  Pain/Anxiety/Delirium protocol (if indicated): Yes (RASS goal -1)  VAP protocol (if indicated): Yes DVT prophylaxis: Systemic AC - Heparin  GI prophylaxis: PPI   Glucose control:  SSI Yes - SSI + basal  Central venous access:  Yes, and it is still needed, Arterial line:  N/A  Foley:  Yes, and it is still needed  Mobility:  bed rest  PT consulted: N/A Last date of multidisciplinary goals of care discussion: ongoing  Code Status:  full code Disposition: ICU   Garner Nash, DO [facility] 08/22/2020 10:32 AM

## 2020-08-22 NOTE — Progress Notes (Signed)
[facility] Progress Note Patient Name: Danny Carpenter DOB: 02-Aug-1950 MRN: [id number]   Date of Service  08/22/2020  HPI/Events of Note  K+ = 3.6 and Creatinine = 2.19.  eICU Interventions  Will replace K+.      Intervention Category Major Interventions: Electrolyte abnormality - evaluation and management  [name] Cornelia Copa 08/22/2020, 5:41 AM

## 2020-08-22 NOTE — Progress Notes (Signed)
    Progress Note from the Palliative Medicine Team at [facility]   Patient Name: Danny Carpenter        Date: 08/22/2020 DOB: 1950-06-30  Age: 70 y.o. MRN#: [id number] Attending Physician: Danny Olds, MD Primary Care Physician: Danny Grammes, MD Admit Date: 07/24/2020   Medical records reviewed   70 y.o. male  admitted on 07/23/2020 with  past medical history significant for hypertension, hyperlipidemia, type 2 diabetes, CVA, AAA, left foot amputation s/p fem pop bypass and heart failure who presented to the emergency department 4/21 after undergoing outpatient stress test that revealed EF of 20% and acute ischemia.  Since admission patient was evaluated by cardiology and cardiothoracic surgery and patient underwent TEE and left heart cath. LHC/RHC revealed severe three-vessel coronary artery disease with moderately elevated left heart and pulmonary artery pressures. At that time patient was placed on goal-directed medical therapy for acute systolic congestive heart failure and CTS surgery was consulted for CABG potential.  Patient underwent CABG x5 with Dr. Orvan Carpenter 4/26, post CABG CODE BLUE called due to low flow on Impella, CTS notified. Patient underwent bedside exploratory postoperative open heart where Dr. Julien Carpenter reopened the chest incision and hematoma was removed, patient's hemodynamics improved upon opening sternotomy.  PCCM consulted 4/28 due to difficulty weaning ventilator.  He was trached  08-07-20  CRRT initiated on 08/01/2020, currently on hold.       Head CT on 08/04/2020 significant for multiple areas of posterior circulation infarct.  08-12-20 30 minutes in Torsades until paced out, code status reversed  08-14-20 persistent fevers.  ID consulted, pan CT.    08-17-20-creatinine improved, still making urine but slowly   HD on hold for now.  08-21-20-removal of Impella      Brain MRI  08-22-20 family was able to talk with neurology today   Patient is off sedation  and unable to follow commands.  Previous EEG showed severe encephalopathy.  MRI completed yesterday shows multifocal subacute infarcts.  Long-term prognosis significant for severe disability with likely prolonged supportive care given his multiple medical comorbidities.  I was able to meet again with family at their request.  Created space and opportunity for his wife, daughter/her HPOA/Danny Carpenter and his other daughter to explore the thoughts and feelings regarding patient's current medical situation.  At this time they see prolong suffering.  They do not believe that [name] would "want to continue on like this".  Once family have the opportunity to all gathered tomorrow plan is to shift from full scope treatment to a comfort and dignity path..  Plan of care -No compression no shock in the event of cardiac arrest. -Continue with ventilator until tomorrow when family has chance to gather -Begin to minimize medications, decrease tube feeds and free water -Utilization of as needed meds to enhance comfort -Meet tomorrow with family at 12:00 for full shift to comfort approach.   Education offered on the natural trajectory and expectations at end-of-life. Prepared family that anything can happen at any time    Notified/discussed with CCM, cardiothoracic and bedside nursing.  PMT will continue to support holistically.    Total time spent on the unit was  45 minutes  Greater than 50% of the time was spent in counseling and coordination of care  Wadie Lessen NP  Palliative Medicine Team Team Phone # [phone] Pager [phone]

## 2020-08-22 NOTE — Progress Notes (Signed)
[facility] Infectious Diseases Follow Up Note  Patient Identification: Patient Name: Arnel Eben MRN: [id number] [place] Date: 07/26/2020 11:11 AM Age: 70 y.o.Today's Date: 08/22/2020   Reason for Visit: Fevers and critically ill   Principal Problem:   Acute acalculous cholecystitis Active Problems:   Acute on chronic heart failure (HCC)   Non-ST elevation (NSTEMI) myocardial infarction [facility])   Essential hypertension   History of open heart surgery   Cardiogenic shock ([place])   Coronary artery disease involving coronary bypass graft of native heart with unstable angina pectoris (HCC)   Pleural effusion on right   AKI (acute kidney injury) ([place])   Cerebral embolism with cerebral infarction   Pressure injury of skin   Fever   Palliative care by specialist  Antibiotics: Meropenem 5/10-5/16                    anidulafungin 5/16-5/17                    Zosyn 5/18 -current  Lines/Tubes: Rt IJ hemodialysis catheter, cholecystostomy tube, left arm PICC line, NG tube   Interval Events: low grade fevers and leukocytosis. MRI brain with bilateral acute and subacute infarcts    Assessment/Plan  Given MRI brain findings with acute and subacute infarcts. Neurology and palliative team had meeting with family, who has leaned for comfort care and will be shifted to full comfort care tomorrow at 12:00.  ID will sign off   Rest of the management as per the primary team. Thank you for the consult. Please page with pertinent questions or concerns. ______________________________________________________________________ Subjective patient seen and examined at the bedside. Intubated and sedated   Vitals BP (!) 153/71   Pulse 94   Temp (!) 100.76 F (38.2 C)   Resp (!) 28   Ht '6\' 1"'$  (1.854 m)   Wt 102.6 kg   SpO2 100%   BMI 29.84 kg/m     Physical Exam Constitutional:  Intubated and sedated     Comments: Rt eye conjunctivitis and  rt eye bandaged   Cardiovascular:     Rate and Rhythm: Normal rate and regular rhythm.     Heart sounds:   Pulmonary:     Effort: Loud ventilator sounds     Comments:   Abdominal:     Palpations: Abdomen is soft.     Tenderness: non-distended and non tender  Skin:    Comments: No lesions or rashes   Neurological:     General: unable to assess  Psychiatric:        Mood and Affect: unable to assess  Pertinent Microbiology Results for orders placed or performed during the hospital encounter of 07/13/2020  Resp Panel by RT-PCR (Flu A&B, Covid) Nasopharyngeal Swab     Status: None   Collection Time: 07/24/2020  1:11 PM   Specimen: Nasopharyngeal Swab; Nasopharyngeal(NP) swabs in vial transport medium  Result Value Ref Range Status   SARS Coronavirus 2 by RT PCR NEGATIVE NEGATIVE Final    Comment: (NOTE) SARS-CoV-2 target nucleic acids are NOT DETECTED.  The SARS-CoV-2 RNA is generally detectable in upper respiratory specimens during the acute phase of infection. The lowest concentration of SARS-CoV-2 viral copies this assay can detect is 138 copies/mL. A negative result does not preclude SARS-Cov-2 infection and should not be used as the sole basis for treatment or other patient management decisions. A negative result may occur with  improper specimen collection/handling, submission of specimen other than nasopharyngeal swab, presence  of viral mutation(s) within the areas targeted by this assay, and inadequate number of viral copies(<138 copies/mL). A negative result must be combined with clinical observations, patient history, and epidemiological information. The expected result is Negative.  Fact Sheet for Patients:  EntrepreneurPulse.com.au  Fact Sheet for Healthcare Providers:  IncredibleEmployment.be  This test is no t yet approved or cleared by the [place] FDA and  has been authorized for detection and/or diagnosis of  SARS-CoV-2 by FDA under an Emergency Use Authorization (EUA). This EUA will remain  in effect (meaning this test can be used) for the duration of the COVID-19 declaration under Section 564(b)(1) of the Act, 21 U.S.C.section 360bbb-3(b)(1), unless the authorization is terminated  or revoked sooner.       Influenza A by PCR NEGATIVE NEGATIVE Final   Influenza B by PCR NEGATIVE NEGATIVE Final    Comment: (NOTE) The Xpert Xpress SARS-CoV-2/FLU/RSV plus assay is intended as an aid in the diagnosis of influenza from Nasopharyngeal swab specimens and should not be used as a sole basis for treatment. Nasal washings and aspirates are unacceptable for Xpert Xpress SARS-CoV-2/FLU/RSV testing.  Fact Sheet for Patients: EntrepreneurPulse.com.au  Fact Sheet for Healthcare Providers: IncredibleEmployment.be  This test is not yet approved or cleared by the [place] FDA and has been authorized for detection and/or diagnosis of SARS-CoV-2 by FDA under an Emergency Use Authorization (EUA). This EUA will remain in effect (meaning this test can be used) for the duration of the COVID-19 declaration under Section 564(b)(1) of the Act, 21 U.S.C. section 360bbb-3(b)(1), unless the authorization is terminated or revoked.  Performed at [facility], [place] [address] [place], [place] [address]   Surgical pcr screen     Status: None   Collection Time: 07/24/20  7:58 PM   Specimen: Nasal Mucosa; Nasal Swab  Result Value Ref Range Status   MRSA, PCR NEGATIVE NEGATIVE Final   Staphylococcus aureus NEGATIVE NEGATIVE Final    Comment: (NOTE) The Xpert SA Assay (FDA approved for NASAL specimens in patients 52 years of age and older), is one component of a comprehensive surveillance program. It is not intended to diagnose infection nor to guide or monitor treatment. Performed at [facility], [place] [address] [place], [place] [address]   Culture, blood  (routine x 2)     Status: None   Collection Time: 07/27/20  8:18 AM   Specimen: BLOOD  Result Value Ref Range Status   Specimen Description BLOOD RIGHT ANTECUBITAL  Final   Special Requests   Final    BOTTLES DRAWN AEROBIC AND ANAEROBIC Blood Culture adequate volume   Culture   Final    NO GROWTH 5 DAYS Performed at [facility], [place] [address], [place], [place] [address]    Report Status 08/01/2020 FINAL  Final  Culture, blood (routine x 2)     Status: None   Collection Time: 07/27/20  8:25 AM   Specimen: BLOOD RIGHT HAND  Result Value Ref Range Status   Specimen Description BLOOD RIGHT HAND  Final   Special Requests   Final    BOTTLES DRAWN AEROBIC AND ANAEROBIC Blood Culture adequate volume   Culture   Final    NO GROWTH 5 DAYS Performed at [facility], [place] [address] [place], [place] [address]    Report Status 08/01/2020 FINAL  Final  Culture, Respiratory w Gram Stain     Status: None   Collection Time: 07/27/20  2:42 PM  Specimen: Tracheal Aspirate; Respiratory  Result Value Ref Range Status   Specimen Description TRACHEAL ASPIRATE  Final   Special Requests NONE  Final   Gram Stain   Final    RARE WBC PRESENT, PREDOMINANTLY PMN RARE GRAM NEGATIVE RODS    Culture   Final    RARE Normal respiratory flora-no Staph aureus or Pseudomonas seen Performed at [facility], [address], [place], [place] [address]    Report Status 07/30/2020 FINAL  Final  Culture, Respiratory w Gram Stain     Status: None   Collection Time: 08/06/20  5:13 PM   Specimen: Tracheal Aspirate; Respiratory  Result Value Ref Range Status   Specimen Description TRACHEAL ASPIRATE  Final   Special Requests NONE  Final   Gram Stain   Final    ABUNDANT WBC PRESENT,BOTH PMN AND MONONUCLEAR FEW GRAM POSITIVE COCCI IN PAIRS MODERATE GRAM NEGATIVE RODS    Culture   Final    MODERATE Normal respiratory flora-no Staph aureus or Pseudomonas seen Performed at [facility],  [address] [place], [place] [address]    Report Status 08/03/2020 FINAL  Final  Culture, blood (routine x 2)     Status: None   Collection Time: 08/12/20  3:20 PM   Specimen: BLOOD RIGHT HAND  Result Value Ref Range Status   Specimen Description BLOOD RIGHT HAND  Final   Special Requests   Final    BOTTLES DRAWN AEROBIC AND ANAEROBIC Blood Culture adequate volume   Culture   Final    NO GROWTH 5 DAYS Performed at [facility], [place] [address] [place], [place] [address]    Report Status 08/17/2020 FINAL  Final  Culture, blood (routine x 2)     Status: None   Collection Time: 08/12/20  3:20 PM   Specimen: BLOOD LEFT HAND  Result Value Ref Range Status   Specimen Description BLOOD LEFT HAND  Final   Special Requests   Final    BOTTLES DRAWN AEROBIC AND ANAEROBIC Blood Culture adequate volume   Culture   Final    NO GROWTH 5 DAYS Performed at [facility], [place] [address], [place], [place] [address]    Report Status 08/17/2020 FINAL  Final  Culture, blood (routine x 2)     Status: None   Collection Time: 08/14/20  8:55 AM   Specimen: BLOOD  Result Value Ref Range Status   Specimen Description BLOOD RIGHT ANTECUBITAL  Final   Special Requests   Final    BOTTLES DRAWN AEROBIC AND ANAEROBIC Blood Culture adequate volume   Culture   Final    NO GROWTH 6 DAYS Performed at [facility], [place] [address], [place], [place] [address]    Report Status 08/20/2020 FINAL  Final  Culture, blood (routine x 2)     Status: None   Collection Time: 08/14/20  8:55 AM   Specimen: BLOOD  Result Value Ref Range Status   Specimen Description BLOOD LEFT ANTECUBITAL  Final   Special Requests   Final    BOTTLES DRAWN AEROBIC AND ANAEROBIC Blood Culture adequate volume   Culture   Final    NO GROWTH 6 DAYS Performed at [facility], [place] [address], [place], [place] [address]    Report Status 08/20/2020 FINAL  Final  Aerobic/Anaerobic Culture w Gram Stain (surgical/deep wound)      Status: None   Collection Time: 08/16/20  4:30 PM  Specimen: PATH Gallbladder; Tissue  Result Value Ref Range Status   Specimen Description FLUID GALLBLADDER  Final   Special Requests NONE  Final   Gram Stain   Final    ABUNDANT WBC PRESENT,BOTH PMN AND MONONUCLEAR NO ORGANISMS SEEN    Culture   Final    No growth aerobically or anaerobically. Performed at [facility], [place] [address], [place], [place] [address]    Report Status 08/21/2020 FINAL  Final  Aerobic/Anaerobic Culture w Gram Stain (surgical/deep wound)     Status: None (Preliminary result)   Collection Time: 08/26/2020  8:14 AM   Specimen: Wound  Result Value Ref Range Status   Specimen Description WOUND AXILLARY GRAFT FROM IMPELLA SPEC A  Final   Special Requests NONE  Final   Gram Stain   Final    RARE WBC PRESENT, PREDOMINANTLY MONONUCLEAR NO ORGANISMS SEEN    Culture   Final    NO GROWTH 4 DAYS NO ANAEROBES ISOLATED; CULTURE IN PROGRESS FOR 5 DAYS Performed at [facility], [place] [address] [place], [place] [address]    Report Status PENDING  Incomplete    Pertinent Lab. CBC Latest Ref Rng & Units 08/22/2020 08/21/2020 08/20/2020  WBC 4.0 - 10.5 K/uL 8.1 8.5 8.0  Hemoglobin 13.0 - 17.0 g/dL 7.8(L) 9.0(L) 8.0(L)  Hematocrit 39.0 - 52.0 % 26.3(L) 29.3(L) 26.4(L)  Platelets 150 - 400 K/uL 351 408(H) 370   CMP Latest Ref Rng & Units 08/22/2020 08/21/2020 08/20/2020  Glucose 70 - 99 mg/dL 220(H) 267(H) 266(H)  BUN 8 - 23 mg/dL 65(H) 74(H) 78(H)  Creatinine 0.61 - 1.24 mg/dL 2.19(H) 2.45(H) 2.61(H)  Sodium 135 - 145 mmol/L 153(H) 149(H) 149(H)  Potassium 3.5 - 5.1 mmol/L 3.6 4.1 4.1  Chloride 98 - 111 mmol/L 115(H) 116(H) 116(H)  CO2 22 - 32 mmol/L '23 24 23  '$ Calcium 8.9 - 10.3 mg/dL 7.8(L) 8.3(L) 8.3(L)  Total Protein 6.5 - 8.1 g/dL - - -  Total Bilirubin 0.3 - 1.2 mg/dL - - -  Alkaline Phos 38 - 126 U/L - - -  AST 15 - 41 U/L - - -  ALT 0 - 44 U/L - - -     Pertinent Imaging today Plain films  and CT images have been personally visualized and interpreted; radiology reports have been reviewed. Decision making incorporated into the Impression / Recommendations.  MRI brain 08/22/20 IMPRESSION: 1. Patchy multifocal acute to subacute ischemic infarcts involving the bilateral cerebral hemispheres, right greater than left, as well as the pons, right middle cerebellar peduncle, and cerebellum. No associated hemorrhage or significant mass effect. 2. Underlying age-related cerebral atrophy.   I have spent more than 35 minutes for this patient encounter including review of prior medical records, coordination of care  with greater than 50% of time being face to face/counseling and discussing diagnostics/treatment plan with the patient/family.  Electronically signed by:   Rosiland Oz, MD Infectious Disease Physician [facility] Pager: [phone]

## 2020-08-22 NOTE — Progress Notes (Signed)
Referring Physician(s): CCM Dr Valeta Harms  Supervising Physician: Corrie Mckusick  Patient Status:  [facility] - In-pt  Chief Complaint:  Sepsis; acalculous cholecytstitis  Subjective:  5/18 IR procedure: Successful placement of percutaneous, transhepatic cholecystostomy tube. On vent/trach No response Chole tube intact OP bloody  Allergies: Patient has no known allergies.  Medications: Prior to Admission medications   Medication Sig Start Date End Date Taking? Authorizing Provider  amLODipine (NORVASC) 10 MG tablet Take 1 tablet (10 mg total) by mouth daily. 01/21/19  Yes Masoudi, Elhamalsadat, MD  aspirin EC 81 MG tablet Take 81 mg by mouth daily. Swallow whole.   Yes [provider]  clopidogrel (PLAVIX) 75 MG tablet Take 75 mg by mouth daily.   Yes [provider]  DULoxetine (CYMBALTA) 30 MG capsule Take 30 mg by mouth daily.   Yes [provider]  gabapentin (NEURONTIN) 600 MG tablet Take 600 mg by mouth 3 (three) times daily.   Yes [provider]  Insulin Degludec (TRESIBA) 100 UNIT/ML SOLN Inject 62 Units into the skin in the morning. Every morning to control blood sugar   Yes [provider]  levocetirizine (XYZAL) 5 MG tablet Take 5 mg by mouth at bedtime.   Yes [provider]  lisinopril (ZESTRIL) 40 MG tablet Take 40 mg by mouth daily.   Yes [provider]  metFORMIN (GLUCOPHAGE) 1000 MG tablet Take 1,000 mg by mouth 2 (two) times daily with a meal.   Yes [provider]  nitroGLYCERIN (NITROSTAT) 0.4 MG SL tablet Place 0.4 mg under the tongue every 5 (five) minutes as needed for chest pain.   Yes [provider]  pantoprazole (PROTONIX) 40 MG tablet Take 40 mg by mouth daily before breakfast.   Yes [provider]  rosuvastatin (CRESTOR) 20 MG tablet Take 20 mg by mouth daily.   Yes [provider]  Vitamin D, Ergocalciferol, (DRISDOL) 1.25 MG (50000 UNIT) CAPS capsule Take  50,000 Units by mouth 2 (two) times a week.   Yes [provider]     Vital Signs: BP 138/71   Pulse 77   Temp 99.86 F (37.7 C)   Resp 13   Ht '6\' 1"'$  (1.854 m)   Wt 226 lb 3.1 oz (102.6 kg)   SpO2 100%   BMI 29.84 kg/m   Physical Exam Skin:    General: Skin is warm.     Comments: Site of chole drain c/d/i No bleeding OP of tube- bloody 375 cc yesterday- 50 cc today Flushes easily NGTD     Imaging: MR BRAIN WO CONTRAST  Result Date: 08/22/2020 CLINICAL DATA:  Initial evaluation for acute stroke. EXAM: MRI HEAD WITHOUT CONTRAST TECHNIQUE: Multiplanar, multiecho pulse sequences of the brain and surrounding structures were obtained without intravenous contrast. COMPARISON:  Prior CT from 08/15/2020. FINDINGS: Brain: Examination somewhat degraded by motion artifact. Generalized age-related cerebral atrophy. Patchy multifocal areas of diffusion abnormality seen involving the subcortical aspect of both cerebral hemispheres, right greater than left. For reference purposes, the largest focus is seen at the posterior right frontal centrum semi ovale and measures 9 mm (series 5, image 86). Patchy involvement of the corpus callosum including the splenium. Prominent involvement of the right occipital lobe (series 5, image 77). Additional patchy foci of diffusion abnormality seen involving the pons, right middle cerebellar peduncle, and cerebellum. Findings consistent with acute subacute ischemic infarcts. No associated hemorrhage or significant mass effect. Involvement at the right cerebellar peduncle is more subacute  in appearance, and corresponds with hyperdensity seen on prior CT. Gray-white matter differentiation otherwise maintained. Scattered foci of chronic hemosiderin staining noted involving the left cerebral hemisphere, suggesting prior subarachnoid hemorrhage. No acute intracranial hemorrhage. No mass lesion, midline shift or mass effect. No hydrocephalus or extra-axial fluid  collection. Pituitary gland and suprasellar region within normal limits. Midline structures intact. Vascular: Major intracranial vascular flow voids are maintained. Skull and upper cervical spine: Craniocervical junction within normal limits. Bone marrow signal intensity normal. No scalp soft tissue abnormality. Sinuses/Orbits: Patient status post bilateral ocular lens replacement. Globes and orbital soft tissues demonstrate no acute finding. Scattered mucosal thickening noted throughout the paranasal sinuses. Fluid seen layering within the nasopharynx. Bilateral mastoid effusions noted. Other: None. IMPRESSION: 1. Patchy multifocal acute to subacute ischemic infarcts involving the bilateral cerebral hemispheres, right greater than left, as well as the pons, right middle cerebellar peduncle, and cerebellum. No associated hemorrhage or significant mass effect. 2. Underlying age-related cerebral atrophy. Electronically Signed   By: Jeannine Boga M.D.   On: 08/22/2020 04:07   DG CHEST PORT 1 VIEW  Result Date: 08/21/2020 CLINICAL DATA:  Ventilator dependence. EXAM: PORTABLE CHEST 1 VIEW COMPARISON:  08/16/2020 FINDINGS: Tracheostomy tube appears to be appropriately positioned. Impella left ventricular assist device has been removed. Right jugular central line is in the upper SVC region and stable. Left arm PICC line tip in the SVC and stable. Left subclavian central line has been removed. Heart remains enlarged. There is a feeding tube that extends into the abdomen but the tip is beyond the image. Slightly prominent interstitial lung densities bilaterally. Old left rib fractures. Again noted are multiple metallic densities overlying the left lower chest. IMPRESSION: 1. Slightly increased interstitial lung densities. Findings could represent mild edema. 2. Support apparatuses as described. Electronically Signed   By: Markus Daft M.D.   On: 08/21/2020 08:04   ECHO INTRAOPERATIVE TEE  Result Date:  08/09/2020  *INTRAOPERATIVE TRANSESOPHAGEAL REPORT *  Patient Name:   [facility] Date of Exam: 08/13/2020 Medical Rec #:  [id number]     Height:       73.0 in Accession #:    [id number]    Weight:       240.1 lb Date of Birth:  1950-06-07    BSA:          2.33 m Patient Age:    41 years      BP:           169/106 mmHg Patient Gender: M             HR:           92 bpm. Exam Location:  Inpatient Transesophogeal exam was perform intraoperatively during surgical procedure. Patient was closely monitored under general anesthesia during the entirety of examination. Indications:     Impella device removal Performing Phys: Annye Asa MD Diagnosing Phys: Annye Asa MD Complications: No known complications during this procedure. POST-OP IMPRESSIONS Limited exam: post-Impella removal  - Left Ventricle: The ptient tolerated Impella removal well, with Epionephrine and continued Dobutamine support. The LV now has normal systolic function, with overall EF 56%. The septum is only mildly hypokinetic, with overall excellent recovery of contractility. The cavity size was only mildly dilated. - Right Ventricle: The RV also has improvement in contractility. it has only mildly reduced function in the apex. It is no longer dilated. - Aortic Valve: There has been interval removal of the Impella device. No aortic stenosis present, with peak  gradient 23 mmHg, mean gradient 8 mmHg. There is no regurgitation. - Mitral Valve: The mitral valve function appears unchanged from pre-bypass images. No stenosis present, with peak gradient 7 mmHg, mean gradient 3 mmHg. There is no mitral regurgitation. - Tricuspid Valve: The tricuspid valve function appears unchanged from pre-bypass images. There is no regurgitation. PRE-OP FINDINGS  Left Ventricle: The left ventricle has severely reduced systolic function, with an ejection fraction of 20-30%, measured 25%. The cavity size was moderately dilated. Left ventricular diffuse hypokinesis.  Severe hypokinesis of the entire septal wall. There is no left ventricular hypertrophy. Left ventricular diastolic function was not evaluated. The Impella assist device is in good position in the LV, through the aortic outflow tract. Right Ventricle: The right ventricle has severely reduced systolic function. The cavity was dilated. There is no increase in right ventricular wall thickness. The apex is extremely hypokinetic. Left Atrium: Left atrial size was normal in size. No left atrial/left atrial appendage thrombus was detected. Left atrial appendage velocity is normal at greater than 40 cm/s. Right Atrium: Right atrial size was normal in size. Interatrial Septum: No atrial level shunt detected by color flow Doppler. There is no evidence of a patent foramen ovale. Pericardium: There is no evidence of pericardial effusion. Mitral Valve: The mitral valve is normal in structure. Mitral valve regurgitation is not visualized by color flow Doppler. There is no evidence of mitral valve vegetation. There is no evidence of mitral stenosis. Tricuspid Valve: The tricuspid valve was normal in structure. Tricuspid valve regurgitation is trivial by color flow Doppler. No evidence of tricuspid stenosis is present. Aortic Valve: The aortic valve is tricuspid. Aortic valve regurgitation is trivial by color flow Doppler, around the Impella assist device. The Impella is well positioned through the aortic valve. There is no apparent stenosis of the aortic valve. There is mild thickening present on the aortic valve right coronary, left coronary and non-coronary cusps with normal mobility. Pulmonic Valve: The pulmonic valve was normal in structure, with normal leaflet excursion. No evidence of pulmonic stenosis. Pulmonic valve regurgitation is trivial by color flow Doppler. Aorta: There is evidence of scattered complex plaque in the ascending aorta, aortic arch and descending aorta; Grade III, measuring 3-41m in size. Pulmonary  Artery: The pulmonary artery is of normal size. Venous: The inferior vena cava is normal in size with less than 50% respiratory variability, suggesting right atrial pressure of 8 mmHg. Shunts: There is no evidence of an atrial septal defect.  CAnnye AsaMD Electronically signed by CAnnye AsaMD Signature Date/Time: 08/13/2020/5:17:40 PM    Final     Labs:  CBC: Recent Labs    08/19/20 0450 08/20/20 0420 08/20/20 0425 08/21/20 0025 08/22/20 0402  WBC 7.9  --  8.0 8.5 8.1  HGB 7.0* 7.8* 8.0* 9.0* 7.8*  HCT 22.9* 23.0* 26.4* 29.3* 26.3*  PLT 323  --  370 408* 351    COAGS: Recent Labs    07/24/2020 1121 07/14/2020 1617 06/30/2020 1933 08/16/20 1327  INR 1.0 1.4* 1.5* 1.2  APTT  --  99* 41*  --     BMP: Recent Labs    08/20/20 0425 08/20/20 1545 08/21/20 0025 08/22/20 0402  NA 149* 149* 149* 153*  K 4.1 4.1 4.1 3.6  CL 115* 116* 116* 115*  CO2 '25 23 24 23  '$ GLUCOSE 217* 266* 267* 220*  BUN 83* 78* 74* 65*  CALCIUM 8.0* 8.3* 8.3* 7.8*  CREATININE 2.82* 2.61* 2.45* 2.19*  GFRNONAA 24* 26*  28* 32*    LIVER FUNCTION TESTS: Recent Labs    08/16/20 0429 08/27/2020 0528 08/19/20 0450 08/20/20 0425  BILITOT 1.8* 1.2 1.0 1.3*  AST 303* 45* 33 61*  ALT 105* 48* 31 44  ALKPHOS 517* 418* 305* 291*  PROT 5.8* 6.2* 5.9* 6.0*  ALBUMIN 1.8* 1.9* 1.7* 1.7*    Assessment and Plan:  Perc chole drain intact Bloody OP NGTD Will follow This drain will stay in at least 6-8 weeks- unless goes to OR   Electronically Signed: Lavonia Drafts, PA-C 08/22/2020, 7:29 AM   I spent a total of 15 Minutes at the the patient's bedside AND on the patient's hospital floor or unit, greater than 50% of which was counseling/coordinating care for perc chole drain

## 2020-08-22 NOTE — Progress Notes (Signed)
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 +----------+--------+--------+--------+------------------------------+--------+  PSV cm/sEDV cm/sStenosisDescribe                      Comments +----------+--------+--------+--------+------------------------------+--------+ CCA Prox  79      16                                                      +----------+--------+--------+--------+------------------------------+--------+ CCA Distal69      16              focal, smooth and heterogenous         +----------+--------+--------+--------+------------------------------+--------+ ICA Prox  146     44      40-59%  heterogenous and calcific              +----------+--------+--------+--------+------------------------------+--------+ ICA Distal100     28                                                     +----------+--------+--------+--------+------------------------------+--------+ ECA       53      5               heterogenous and calcific              +----------+--------+--------+--------+------------------------------+--------+ +----------+--------+--------+----------------+------------+ SubclavianPSV cm/sEDV cm/sDescribe        Arm Pressure +----------+--------+--------+----------------+------------+           153             Multiphasic, WNL             +----------+--------+--------+----------------+------------+ +---------+--------+--+--------+--+---------+ VertebralPSV cm/s39EDV cm/s11Antegrade +---------+--------+--+--------+--+---------+  ABI Findings: +---------+------------------+-----+----------+--------+ Right    Rt Pressure (mmHg)IndexWaveform  Comment  +---------+------------------+-----+----------+--------+ Brachial 126                    triphasic          +---------+------------------+-----+----------+--------+ PTA      106               0.84 monophasic         +---------+------------------+-----+----------+--------+ DP       98                0.78 monophasic         +---------+------------------+-----+----------+--------+ [place] Campbell               1.09                    +---------+------------------+-----+----------+--------+ +---------+------------------+-----+----------+--------------------------------+ Left     Lt  Pressure (mmHg)IndexWaveform  Comment                          +---------+------------------+-----+----------+--------------------------------+ Brachial 120                    triphasic                                  +---------+------------------+-----+----------+--------------------------------+ PTA      128               1.02 monophasic                                 +---------+------------------+-----+----------+--------------------------------+  DP       112               0.89 monophasic                                 +---------+------------------+-----+----------+--------------------------------+ Great Toe                                 Unable to obtain pressure due to                                           amputation                       +---------+------------------+-----+----------+--------------------------------+ +-------+---------------+----------------+ ABI/TBIToday's ABI/TBIPrevious ABI/TBI +-------+---------------+----------------+ Right  0.84/1.09      0.73/0.69        +-------+---------------+----------------+ Left   1.02/-         0.89/0.38        +-------+---------------+----------------+  Right Doppler Findings: +-----------+--------+-----+---------+-----------------------------------------+ Site       PressureIndexDoppler  Comments                                  +-----------+--------+-----+---------+-----------------------------------------+ Brachial   126          triphasic                                          +-----------+--------+-----+---------+-----------------------------------------+ Radial                  triphasic                                          +-----------+--------+-----+---------+-----------------------------------------+ Ulnar                   triphasic                                          +-----------+--------+-----+---------+-----------------------------------------+ Palmar Arch                       Signal obliterates with radial                                             compression, is unaffected with ulnar                                      compression                               +-----------+--------+-----+---------+-----------------------------------------+  Left Doppler Findings: +-----------+--------+-----+---------+-----------------------------------------+ Site       PressureIndexDoppler  Comments                                  +-----------+--------+-----+---------+-----------------------------------------+  Brachial   120          triphasic                                          +-----------+--------+-----+---------+-----------------------------------------+ Radial                  triphasic                                          +-----------+--------+-----+---------+-----------------------------------------+ Ulnar                   triphasic                                          +-----------+--------+-----+---------+-----------------------------------------+ Palmar Arch                      Signal is unaffected with radial                                           compression, decreases 50% with ulnar                                      compression.                              +-----------+--------+-----+---------+-----------------------------------------+  Summary: Right Carotid: Velocities in the right ICA are consistent with a 1-39% stenosis. Left Carotid: Velocities in the left ICA are consistent with a 40-59% stenosis. Vertebrals:  Bilateral vertebral arteries demonstrate antegrade flow. Subclavians: Normal flow hemodynamics were seen in bilateral subclavian              arteries. Right ABI: Resting right ankle-brachial index indicates mild right lower extremity arterial disease. The right toe-brachial index is normal. Left ABI: Resting left ankle-brachial index is within normal range. No evidence of  significant left lower extremity arterial disease. Right Upper Extremity: No significant arterial obstruction detected in the right upper extremity. Left Upper Extremity: No significant arterial obstruction detected in the left upper extremity.  Electronically signed by Ruta Hinds MD on 07/24/2020 at 4:12:13 PM.    Final    VAS [place] LOWER EXTREMITY VENOUS (DVT)  Result Date: 08/16/2020  Lower Venous DVT Study Patient Name:  [facility] [place]  Date of Exam:   08/16/2020 Medical Rec #: [id number]      Accession #:    [id number] Date of Birth: 1950/07/13     Patient Gender: M Patient Age:   69Y Exam Location:  [facility] Procedure:      VAS [place] LOWER EXTREMITY VENOUS (DVT) Referring Phys: [id number] [place] --------------------------------------------------------------------------------  Indications: Edema.  Comparison Study: 08/11/20 previous Performing Technologist: Abram Sander RVS  Examination Guidelines: A complete evaluation includes B-mode imaging, spectral Doppler, color Doppler, and power Doppler as needed of all accessible portions of each vessel. Bilateral testing is considered an integral part of a complete examination. Limited examinations for  reoccurring indications may be performed as noted. The reflux portion of the exam is performed with the patient in reverse Trendelenburg.  +---------+---------------+---------+-----------+----------+-------------------+ RIGHT    CompressibilityPhasicitySpontaneityPropertiesThrombus Aging      +---------+---------------+---------+-----------+----------+-------------------+ CFV      Full           Yes      Yes                                      +---------+---------------+---------+-----------+----------+-------------------+ SFJ      Full                                                             +---------+---------------+---------+-----------+----------+-------------------+ FV Prox  Full                                                              +---------+---------------+---------+-----------+----------+-------------------+ FV Mid   Full                                                             +---------+---------------+---------+-----------+----------+-------------------+ FV DistalFull                                                             +---------+---------------+---------+-----------+----------+-------------------+ PFV      Full                                                             +---------+---------------+---------+-----------+----------+-------------------+ POP      Full           Yes      Yes                                      +---------+---------------+---------+-----------+----------+-------------------+ PTV      Full                                                             +---------+---------------+---------+-----------+----------+-------------------+ PERO  Not well visualized +---------+---------------+---------+-----------+----------+-------------------+   +---------+---------------+---------+-----------+----------+-------------------+ LEFT     CompressibilityPhasicitySpontaneityPropertiesThrombus Aging      +---------+---------------+---------+-----------+----------+-------------------+ CFV      Full           Yes      Yes                                      +---------+---------------+---------+-----------+----------+-------------------+ SFJ      Full                                                             +---------+---------------+---------+-----------+----------+-------------------+ FV Prox  Full                                                             +---------+---------------+---------+-----------+----------+-------------------+ FV Mid   Full                                                              +---------+---------------+---------+-----------+----------+-------------------+ FV DistalFull                                                             +---------+---------------+---------+-----------+----------+-------------------+ PFV      Full                                                             +---------+---------------+---------+-----------+----------+-------------------+ POP      Full           Yes      Yes                                      +---------+---------------+---------+-----------+----------+-------------------+ PTV      Full                                                             +---------+---------------+---------+-----------+----------+-------------------+ PERO                                                  Not well visualized +---------+---------------+---------+-----------+----------+-------------------+  Summary: BILATERAL: - No evidence of deep vein thrombosis seen in the lower extremities, bilaterally. -No evidence of popliteal cyst, bilaterally.   *See table(s) above for measurements and observations. Electronically signed by Curt Jews MD on 08/16/2020 at 8:37:02 PM.    Final    VAS [place] LOWER EXTREMITY VENOUS (DVT)  Result Date: 08/11/2020  Lower Venous DVT Study Patient Name:  [facility] [place]  Date of Exam:   08/11/2020 Medical Rec #: [id number]      Accession #:    [id number] Date of Birth: 06/29/1950     Patient Gender: M Patient Age:   69Y Exam Location:  [facility] Procedure:      VAS [place] LOWER EXTREMITY VENOUS (DVT) Referring Phys: [id number] [place] --------------------------------------------------------------------------------  Indications: Edema. Other Indications: Status post CABG 4/26 with right leg vein harvested. Limitations: Poor ultrasound/tissue interface. Comparison Study: No prior Performing Technologist: Oda Cogan RDMS, RVT  Examination Guidelines: A complete evaluation includes B-mode imaging,  spectral Doppler, color Doppler, and power Doppler as needed of all accessible portions of each vessel. Bilateral testing is considered an integral part of a complete examination. Limited examinations for reoccurring indications may be performed as noted. The reflux portion of the exam is performed with the patient in reverse Trendelenburg.  +---------+---------------+---------+-----------+----------+--------------+ RIGHT    CompressibilityPhasicitySpontaneityPropertiesThrombus Aging +---------+---------------+---------+-----------+----------+--------------+ CFV      Full           Yes      Yes                                 +---------+---------------+---------+-----------+----------+--------------+ SFJ      Full                                                        +---------+---------------+---------+-----------+----------+--------------+ FV Prox  Full                                                        +---------+---------------+---------+-----------+----------+--------------+ FV Mid   Full                                                        +---------+---------------+---------+-----------+----------+--------------+ FV DistalFull                                                        +---------+---------------+---------+-----------+----------+--------------+ PFV      Full                                                        +---------+---------------+---------+-----------+----------+--------------+ POP      Full  Yes      Yes                                 +---------+---------------+---------+-----------+----------+--------------+ PTV      Full                                                        +---------+---------------+---------+-----------+----------+--------------+ PERO     Full                                                        +---------+---------------+---------+-----------+----------+--------------+ Hematomas  are noted along the incision site in the thigh area.  +---------+---------------+---------+-----------+----------+--------------+ LEFT     CompressibilityPhasicitySpontaneityPropertiesThrombus Aging +---------+---------------+---------+-----------+----------+--------------+ CFV      Full           Yes      Yes                                 +---------+---------------+---------+-----------+----------+--------------+ SFJ      Full                                                        +---------+---------------+---------+-----------+----------+--------------+ FV Prox  Full                                                        +---------+---------------+---------+-----------+----------+--------------+ FV Mid   Full                                                        +---------+---------------+---------+-----------+----------+--------------+ FV DistalFull                                                        +---------+---------------+---------+-----------+----------+--------------+ PFV      Full                                                        +---------+---------------+---------+-----------+----------+--------------+ POP      Full           Yes      Yes                                 +---------+---------------+---------+-----------+----------+--------------+  PTV      Full                                                        +---------+---------------+---------+-----------+----------+--------------+ PERO     Full                                                        +---------+---------------+---------+-----------+----------+--------------+     Summary: BILATERAL: - No evidence of deep vein thrombosis seen in the lower extremities, bilaterally. -No evidence of popliteal cyst, bilaterally.   *See table(s) above for measurements and observations. Electronically signed by Servando Snare MD on 08/11/2020 at 4:56:30 PM.    Final     ECHOCARDIOGRAM LIMITED  Result Date: 08/15/2020    ECHOCARDIOGRAM LIMITED REPORT   Patient Name:   [facility] [place] Date of Exam: 08/15/2020 Medical Rec #:  [id number]     Height:       73.0 in Accession #:    [id number]    Weight:       243.4 lb Date of Birth:  09/22/1950    BSA:          2.339 m Patient Age:    28 years      BP:           130/67 mmHg Patient Gender: M             HR:           80 bpm. Exam Location:  Inpatient Procedure: Limited Echo Indications:    CHF-Acute Systolic K34.91  History:        Patient has prior history of Echocardiogram examinations, most                 recent 08/10/2020. Signs/Symptoms:Fever. Cardiogenic shock:                 Ischemic cardiomyopathy, post-CABG on 4/26. Impella 5.5 down to                 P4. Thrombocytopenia, Anemia, AAA, Acute kidney injury.  Sonographer:    Darlina Sicilian RDCS Referring Phys: [place]  Conclusion(s)/Recommendation(s): LIMITED ECHO for Impella cannula placement. Left Ventricle: Left ventricular ejection fraction, by estimation, is <20%. The left ventricle has severely decreased function. Compared to prior, Similar cannular placement (5.2 cm from the annulus). Rudean Haskell MD Electronically signed by Rudean Haskell MD Signature Date/Time: 08/15/2020/11:01:39 AM    Final    ECHOCARDIOGRAM LIMITED  Result Date: 08/12/2020    ECHOCARDIOGRAM LIMITED REPORT   Patient Name:   [facility] [place] Date of Exam: 08/07/2020 Medical Rec #:  [id number]     Height:       73.0 in Accession #:    [id number]    Weight:       259.5 lb Date of Birth:  08-21-1950    BSA:          2.403 m Patient Age:    42 years      BP:           138/63 mmHg Patient Gender: M  HR:           74 bpm. Exam Location:  Inpatient Procedure: Limited Echo Indications:    Impella positioning  History:        Patient has prior history of Echocardiogram examinations, most                 recent 08/03/2020. CHF and Cardiomyopathy, CAD, Prior CABG, PAD;                  Risk Factors:Hypertension. Cardiogenic shock.  Sonographer:    Dustin Flock Referring Phys: [id number] [place] ECHO for Impella cannula placement. Sanda Klein MD Electronically signed by Sanda Klein MD Signature Date/Time: 08/06/2020/9:06:09 AM    Final    ECHOCARDIOGRAM LIMITED  Result Date: 08/03/2020    ECHOCARDIOGRAM LIMITED REPORT   Patient Name:   [facility] [place] Date of Exam: 08/03/2020 Medical Rec #:  [id number]     Height:       73.0 in Accession #:    [id number]    Weight:       276.2 lb Date of Birth:  [birth year]    BSA:          2.468 m Patient Age:    37 years      BP:           118/79 mmHg Patient Gender: M             HR:           73 bpm. Exam Location:  Inpatient Procedure: Limited Echo and Color Doppler Indications:    I50.23 Acute on chronic systolic (congestive) heart failure  History:        Patient has prior history of Echocardiogram examinations, most                 recent 08/02/2020. COPD, Stroke and PAD; Risk                 Factors:Hypertension, Diabetes, Dyslipidemia and Sleep Apnea.                 Seizures. AAA. GERD.  Sonographer:    Jonelle Sidle Dance Referring Phys: [place]  1. Left ventricular ejection fraction, by estimation, is <20%. The left ventricle has severely decreased function. Limited echo for Impella positioning. Compared to prior study support device has been pulled out of the left ventircle. Measured at 4.75 cm from the annulus. FINDINGS  Left Ventricle: Left ventricular ejection fraction, by estimation, is <20%. The left ventricle has severely decreased function. Rudean Haskell MD Electronically signed by Rudean Haskell MD Signature Date/Time: 08/03/2020/5:58:54 PM    Final    ECHOCARDIOGRAM LIMITED  Result Date: 08/02/2020    ECHOCARDIOGRAM LIMITED REPORT   Patient Name:   [facility] [place] Date of Exam: 08/02/2020 Medical Rec #:  [id number]     Height:       73.0 in Accession #:    [id number]    Weight:       282.0  lb Date of Birth:  31-Jan-1951    BSA:          2.490 m Patient Age:    16 years      BP:           141/89 mmHg Patient Gender: M             HR:           92 bpm. Exam Location:  Inpatient Procedure: Limited  Echo Indications:    Impella positioning  History:        Patient has prior history of Echocardiogram examinations, most                 recent 07/31/2020. CHF and Cardiomyopathy, CAD, Prior CABG, PAD,                 Arrythmias:Atrial Fibrillation; Risk Factors:Diabetes.  Sonographer:    Dustin Flock Referring Phys: [place]  1. Limited echo for Impella positioning EF 20% no effusion Normal RV size Impella seems to be positioned too deep as far as 7 cm distal to aortic annulus with turbulance from outflow near aortic annulus. FINDINGS  Additional Comments: Limited echo for Impella positioning EF 20% no effusion Normal RV size Impella seems to be positioned too deep as far as 7 cm distal to aortic annulus with turbulance from outflow near aortic annulus. Jenkins Rouge MD Electronically signed by Jenkins Rouge MD Signature Date/Time: 08/02/2020/10:52:34 AM    Final    ECHOCARDIOGRAM LIMITED  Result Date: 07/31/2020    ECHOCARDIOGRAM LIMITED REPORT   Patient Name:   [facility] [place] Date of Exam: 07/31/2020 Medical Rec #:  [id number]     Height:       73.0 in Accession #:    [id number]    Weight:       280.9 lb Date of Birth:  February 02, 1951    BSA:          2.486 m Patient Age:    70 years      BP:           113/81 mmHg Patient Gender: M             HR:           89 bpm. Exam Location:  Inpatient Procedure: Limited Echo Indications:    M62.86 Acute systolic (congestive) heart failure  History:        Patient has prior history of Echocardiogram examinations, most                 recent 07/29/2020. Prior CABG; Risk Factors:Hypertension,                 Diabetes and Dyslipidemia.  Sonographer:    Merrie Roof RDCS Referring Phys: [place] Comments: This was a limited echo for  Impella position. The Impella device was not moved. IMPRESSIONS  1. Left ventricular ejection fraction, by estimation, is <20%. The left ventricle has severely decreased function with dyskinetic septum. The left ventricular internal cavity size was mildly dilated. Impella 5.5 catheter present with tip at 5.3 cm from the aortic valve.  2. Right ventricular systolic function is moderately reduced. The right ventricular size is normal. FINDINGS  Left Ventricle: Left ventricular ejection fraction, by estimation, is <20%. The left ventricle has severely decreased function. The left ventricular internal cavity size was mildly dilated. There is no left ventricular hypertrophy. Right Ventricle: The right ventricular size is normal. Right ventricular systolic function is moderately reduced. Loralie Champagne MD Electronically signed by Loralie Champagne MD Signature Date/Time: 07/31/2020/1:52:44 PM    Final    ECHOCARDIOGRAM LIMITED  Result Date: 07/29/2020    ECHOCARDIOGRAM LIMITED REPORT   Patient Name:   [facility] [place] Date of Exam: 07/29/2020 Medical Rec #:  [id number]     Height:       73.0 in Accession #:    [id number]    Weight:  272.5 lb Date of Birth:  1951/01/28    BSA:          2.454 m Patient Age:    64 years      BP:           98/75 mmHg Patient Gender: M             HR:           66 bpm. Exam Location:  Inpatient Procedure: Limited Echo                                STAT ECHO  Dr. Loralie Champagne present at bedside for Impella placement measurements. Indications:    CHF-Acute Systolic E08.14                 Impella placement  History:        Patient has prior history of Echocardiogram examinations, most                 recent 07/28/2028. CHF, CAD, Prior CABG; Risk                 Factors:Hypertension, Diabetes and Dyslipidemia. Stroke. Cardiac                 shock. Impella device.  Sonographer:    Clayton Lefort RDCS (AE) Referring Phys: [id number] [facility]  Sonographer Comments: Technically difficult study due to  poor echo windows, patient is morbidly obese and echo performed with patient supine and on artificial respirator. Image acquisition challenging due to patient body habitus. IMPRESSIONS  1. Impella placement - 2 images. Initial depth of 5.11 cm past the aortic valve appears adequate. FINDINGS  Left Ventricle: Impella placement - 2 images. Initial depth of 5.11 cm past the aortic valve appears adequate. Lyman Bishop MD Electronically signed by Lyman Bishop MD Signature Date/Time: 07/29/2020/11:55:05 AM    Final    ECHOCARDIOGRAM LIMITED  Result Date: 07/28/2020    ECHOCARDIOGRAM LIMITED REPORT   Patient [facility]:   [facility] [facility] Date of Exam: 07/28/2020 Medical Rec #:  [id number]     Height:       73.0 in Accession #:    [id number]    Weight:       272.5 lb Date of Birth:  [birth year]    BSA:          2.454 m Patient Age:    53 years      BP:           90/56 mmHg Patient Gender: M             HR:           86 bpm. Exam Location:  Inpatient Procedure: Limited Echo and Color Doppler Indications:    I50.40* Unspecified combined systolic (congestive) and diastolic                 (congestive) heart failure.Limited echo for Impella placemant.  History:        Patient has prior history of Echocardiogram examinations, most                 recent 07/26/2020. CHF, CAD, Prior CABG, Stroke; Risk                 Factors:Hypertension, Diabetes and Dyslipidemia. Cardiac shock.                 Impella device.  Sonographer:  Roseanna Rainbow RDCS Referring Phys: [id number] AMY D CLEGG  Sonographer Comments: Technically difficult study due to poor echo windows and echo performed with patient supine and on artificial respirator. Image acquisition challenging due to patient body habitus. IMPRESSIONS  1. Impella cathteter noted across the aortic valve and in the left ventricle. Initially 5.9 cm distal to the aortic valve annulus. The catheter was withdrawn to 4.8 cm. Left ventricular ejection fraction, by estimation, is <20%. The left ventricle has  severely decreased function. The left ventricle demonstrates global hypokinesis.  2. Right ventricular systolic function is severely reduced. The right ventricular size is normal.  3. The mitral valve is grossly normal.  4. The aortic valve was not well visualized. FINDINGS  Left Ventricle: Impella cathteter noted across the aortic valve and in the left ventricle. Initially 5.9 cm distal to the aortic valve annulus. The catheter was withdrawn to 4.8 cm. Left ventricular ejection fraction, by estimation, is <20%. The left ventricle has severely decreased function. The left ventricle demonstrates global hypokinesis. The left ventricular internal cavity size was normal in size. Right Ventricle: The right ventricular size is normal. No increase in right ventricular wall thickness. Right ventricular systolic function is severely reduced. Left Atrium: Left atrial size was not assessed. Right Atrium: Right atrial size was not assessed. Pericardium: There is no evidence of pericardial effusion. Mitral Valve: The mitral valve is grossly normal. Aortic Valve: The aortic valve was not well visualized. Skeet Latch MD Electronically signed by Skeet Latch MD Signature Date/Time: 07/28/2020/11:58:19 AM    Final    ECHOCARDIOGRAM LIMITED  Result Date: 07/26/2020    ECHOCARDIOGRAM LIMITED REPORT   Patient Name:   [facility] Date of Exam: 07/26/2020 Medical Rec #:  [id number]     Height:       73.0 in Accession #:    [id number]    Weight:       280.0 lb Date of Birth:  1951/03/14    BSA:          2.482 m Patient Age:    89 years      BP:           99/76 mmHg Patient Gender: M             HR:           92 bpm. Exam Location:  Inpatient Procedure: Limited Echo Indications:    I50.23 Acute on chronic systolic (congestive) heart failure  History:        Patient has prior history of Echocardiogram examinations, most                 recent 07/12/2020. COPD, Stroke and PAD; Risk                 Factors:Hypertension, Diabetes,  Dyslipidemia, Sleep Apnea and                 GERD. Abdominal Aortic Aneurysm.  Sonographer:    Tiffany Dance Referring Phys: [phone] AMY D CLEGG IMPRESSIONS  1. Limited echo to evaluate impella positioning. Impella measures 4.9 cm from aortic valve  2. Left ventricular ejection fraction, by estimation, is <20%. The left ventricle has severely decreased function.  3. Right ventricular systolic function is severely reduced. The right ventricular size is normal. FINDINGS  Left Ventricle: Left ventricular ejection fraction, by estimation, is <20%. The left ventricle has severely decreased function. Right Ventricle: The right ventricular size is normal. Right ventricular systolic function is severely reduced. Harrell Gave  Gardiner Rhyme MD Electronically signed by Oswaldo Milian MD Signature Date/Time: 07/26/2020/10:48:24 AM    Final    [place] EKG SITE RITE  Result Date: 08/15/2020 If Site Rite image not attached, placement could not be confirmed due to current cardiac rhythm.  [place] EKG SITE RITE  Result Date: 08/02/2020 If Site Rite image not attached, placement could not be confirmed due to current cardiac rhythm.  CT CHEST ABDOMEN PELVIS WO CONTRAST  Result Date: 08/15/2020 CLINICAL DATA:  Sepsis, recent CABG EXAM: CT CHEST, ABDOMEN AND PELVIS WITHOUT CONTRAST TECHNIQUE: Multidetector CT imaging of the chest, abdomen and pelvis was performed following the standard protocol without IV contrast. COMPARISON:  09/28/2018 FINDINGS: CT CHEST FINDINGS Cardiovascular: Aortic atherosclerosis. Cardiomegaly status post median sternotomy and CABG. Extensive 3 vessel coronary artery calcifications and/or stents. Right subclavian approach Impella device position within the left ventricle. Right internal jugular and left subclavian venous catheters. No pericardial effusion. Mediastinum/Nodes: No enlarged mediastinal, hilar, or axillary lymph nodes. Tracheostomy. Thyroid and esophagus demonstrate no significant findings.  Lungs/Pleura: Small, right greater than left bilateral pleural effusions and associated atelectasis or consolidation. Musculoskeletal: No chest wall mass or suspicious bone lesions identified. Status post median sternotomy and CABG with postoperative retrosternal stranding and fluid (series 3, image 26). There is no discrete fluid collection identified. Superficial metallic pellets in the left anterior chest wall. CT ABDOMEN PELVIS FINDINGS Hepatobiliary: No solid liver abnormality is seen. The gallbladder is mildly distended with some suggestion of wall thickening and dependent calcified sludge. No discrete gallstones identified. No biliary ductal dilatation. Pancreas: Unremarkable. No pancreatic ductal dilatation or surrounding inflammatory changes. Spleen: Normal in size without significant abnormality. Adrenals/Urinary Tract: Adrenal glands are unremarkable. Small nonobstructive calculus of the superior pole of the right kidney. No hydronephrosis. Bladder is decompressed by a Foley catheter. Thickening of the urinary bladder wall. Stomach/Bowel: Stomach is within normal limits. Enteric feeding tube with tip in the duodenal bulb. Appendix is not clearly visualized. No evidence of bowel wall thickening, distention, or inflammatory changes. Rectal tube. Vascular/Lymphatic: Aortic atherosclerosis. Redemonstrated calcified aneurysm of the infrarenal abdominal aorta measuring 4.5 x 4.3 cm (series 3, image 80). No enlarged abdominal or pelvic lymph nodes. Reproductive: No mass or other abnormality. Other: No abdominal wall hernia or abnormality. Anasarca. Trace ascites throughout the abdomen and pelvis. Musculoskeletal: No acute or significant osseous findings. There is a small hematoma within the left psoas muscle body, measuring approximately 5.5 x 3.6 x 3.1 cm (series 3, image 88, series 6, image 122). IMPRESSION: 1. Status post median sternotomy and CABG with postoperative retrosternal stranding and fluid. There is  no discrete fluid collection or other evidence of postoperative complication identified. 2. The gallbladder is mildly distended with some suggestion of wall thickening and dependent calcified sludge. No discrete gallstones identified. If there is clinical concern for acute cholecystitis, right upper quadrant ultrasound or HIDA may be useful to further evaluate. 3. There is a small hematoma within the left psoas muscle body, measuring approximately 5.5 x 3.6 x 3.1 cm. 4. Small, right greater than left bilateral pleural effusions and associated atelectasis or consolidation. 5. Trace ascites and anasarca. 6. Extensive support apparatus as detailed above including tracheostomy and Impella device. 7. Redemonstrated calcified aneurysm of the infrarenal abdominal aorta measuring 4.5 x 4.3 cm. Recommend follow-up CT/MR every 6 months and vascular consultation when clinically appropriate. This recommendation follows ACR consensus guidelines: White Paper of the ACR Incidental Findings Committee II on Vascular Findings. J Am Coll Radiol 2013; 10:789-794. Aortic Atherosclerosis (  ICD10-I70.0). Electronically Signed   By: Eddie Candle M.D.   On: 08/15/2020 11:39   US Abdomen Limited RUQ (LIVER/GB)  Result Date: 08/16/2020 CLINICAL DATA:  Fever EXAM: ULTRASOUND ABDOMEN LIMITED RIGHT UPPER QUADRANT COMPARISON:  CT Aug 15, 2020 FINDINGS: Gallbladder: Sludge in a dilated gallbladder with wall thickening measuring 3.5 mm and trace pericholecystic fluid. No cholelithiasis visualized. Common bile duct: Diameter: 3 mm Liver: No focal lesion identified. Within normal limits in parenchymal echogenicity. Portal vein is patent on color Doppler imaging with normal direction of blood flow towards the liver. Other: None. IMPRESSION: 1. Sludge in a dilated gallbladder with wall thickening and trace pericholecystic fluid. No cholelithiasis visualized. Findings are suspicious for acute acalculous cholecystitis. Electronically Signed   By:  Dahlia Bailiff MD   On: 08/16/2020 03:35    PHYSICAL EXAM  Temp:  [98.96 F (37.2 C)-101.12 F (38.4 C)] 100.22 F (37.9 C) (05/24 1200) Pulse Rate:  [68-98] 91 (05/24 1200) Resp:  [2-35] 35 (05/24 1200) BP: (126-198)/(59-98) 148/71 (05/24 1200) SpO2:  [100 %] 100 % (05/24 1200) FiO2 (%):  [40 %] 40 % (05/24 1200) Weight:  [102.6 kg] 102.6 kg (05/24 0436)  General -middle-aged Caucasian male on trach collar.  Ophthalmologic - fundi not visualized due to noncooperation.  Cardiovascular - Regular rhythm and rate.  No murmur Neuro -eyes are closed.  On passive eye opening right eye has significant erythema and is stable.  Left eye is also taped but on passive opening is roving side-to-side eye movements but will not follow commands.  Right pupil not examined.  Left pupil 3 mm reactive.  Left corneal is present.  Does not blink to threat on the left.  He is not following commands, nonverbal. eyes left gaze preference position,  . Right mild facial droop, right eye difficulty closure, enlarged palpebral fissure. Tongue protrusion not cooperative.  He is spontaneous left upper extremity movements from time to time against gravity.  Occasionally moves left lower extremity to noxious stimuli.  Minimal movement in the right upper and lower extremity to noxious stimuli.  Voluntary movements to command.  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  ASSESSMENT/PLAN 70 year old male with history of diabetes, hyperlipidemia, hypertension, diabetes, PVD, CAD/MI, CHF, OSA and stroke admitted on 07/05/2020 for severe cardiomyopathy with EF less than 20% and severe CAD status post CABG, put on LVAD.  Postop with multiple complications including respiratory failure, renal failure, heart failure and encephalopathy.  Had trach and PEG on 5/10.  CT head on 5/6 and 5/10 showed multifocal ischemic infarcts.  .  EEG showed severe encephalopathy, no seizure. Repeat CT on 5/17 again showed stable known  infarcts, no new intracranial pathology. He had fever and subsequently found to have acute cholecystitis s/p surgery.   MRI done yesterday shows multifocal subacute infarcts right greater than left including large on the right superior cerebellar peduncle and left paramedian pons.  Etiology for patient embolic strokes likely due to severe cardiomyopathy, recent cardio procedure and an LVAD.    I had a long discussion with the patient's sister-in-law over the phone initially and subsequently I returned and met at the bedside patient's wife and 2 sisters and reviewed imaging films with them and answered questions about his prognosis.  Patient clearly will have significant disability from his bilateral brainstem and multiple supratentorial infarcts may improve with prolonged supportive care given his multiple medical comorbidities severe cardiomyopathy, congestive heart failure, renal failure cholecystitis is process going to likely be  complicated with lots of roller coaster improvement and decline episodes.  His quality of life and best is going to be very poor requiring 24-hour care in a nursing home setting.  Patient's wife clearly states he would not want to live like that and had such a poor quality of life.  Family is going to meet with palliative care nurse practitioner and make decision about goals of care.  Discussed with palliative care NP and Dr. Orvan Seen This patient is critically ill and at significant risk of neurological worsening, death and care requires constant monitoring of vital signs, hemodynamics,respiratory and cardiac monitoring, extensive review of multiple databases, frequent neurological assessment, discussion with family, other specialists and medical decision making of high complexity.I have made any additions or clarifications directly to the above note.This critical care time does not reflect procedure time, or teaching time or supervisory time of PA/NP/Med Resident etc but could  involve care discussion time.  I spent 50 minutes of neurocritical care time  in the care of  this patient.     Antony Contras, MD Stroke Neurology 08/22/2020 1:38 PM

## 2020-08-22 NOTE — Progress Notes (Signed)
Assisted tele visit to patient with son.  Thomas, Anelisse Jacobson Renee, RN   

## 2020-08-22 NOTE — Progress Notes (Addendum)
Inpatient Diabetes Program Recommendations  AACE/ADA: New Consensus Statement on Inpatient Glycemic Control (2015)  Target Ranges:  Prepandial:   less than 140 mg/dL      Peak postprandial:   less than 180 mg/dL (1-2 hours)      Critically ill patients:  140 - 180 mg/dL   Lab Results  Component Value Date   GLUCAP 252 (H) 08/22/2020   HGBA1C 11.6 (H) 07/13/2020    Review of Glycemic Control  Results for Danny Carpenter, Danny Carpenter (MRN [id number]) as of 08/22/2020 13:52  Ref. Range 08/21/2020 20:40 08/21/2020 23:38 08/22/2020 03:30 08/22/2020 07:47 08/22/2020 11:45  Glucose-Capillary Latest Ref Range: 70 - 99 mg/dL 230 (H) 203 (H) 185 (H) 210 (H) 252 (H)   Current orders for Inpatient glycemic control: Levemir increased today to 23 units BID, Novolog 0-15 units Q4H  Inpatient Diabetes Program Recommendations:     If tube feeds continued, please consider:  Novolog 2 units Q4H.  Stop if feeds are held or discontinued.    Will continue to follow while inpatient.  Thank you, Reche Dixon, RN, BSN Diabetes Coordinator Inpatient Diabetes Program [phone] (team pager from 8a-5p)

## 2020-08-22 NOTE — Progress Notes (Signed)
Patient ID: Danny Carpenter, male   DOB: 05/21/1950, 70 y.o.   MRN: [id number]     Advanced Heart Failure Rounding Note  PCP-Cardiologist: None   Subjective:    - 4/26 S/P CABG - 5/4 Swan removed with ongoing fevers, CVVH started - CT head on 5/6 with multiple posterior circulation infarcts.  - 5/10 tracheostomy. CT head similar to prior with multiple small infarcts.  - 5/14 Torsades -> paced out by Dr. Orvan Seen. Impella turned back up from P3 -> P7  after code. Amio gtt switched to lidocaine.   - 5/16 Eraxis added for persistent fevers. Cultures resent>>pending  - 5/17 CT chest/abdomen/pelvis and abdominal US suggestive of acalculous cholecystitis - 5/18 cholecystostomy tube - 5/20 Return to the OR today for Impella removal. TEE with EF 30%-->started on epi 2 mcg with improved EF to 50%.   Continues on mexiletine, remains in AF but no VT.   CO-OX 64% on milrinone 0.125 mcg.  He had Lasix 60 mg IV x 1 yesterday, weight down. I/Os -2337. CVP 10.   Tm 100 , remains on Zosyn.  Has biliary drain.    Hgb 7.8.    Remains on vent--trach. No purposeful movement.    Objective:   Weight Range: 102.6 kg Body mass index is 29.84 kg/m.   Vital Signs:   Temp:  [98.96 F (37.2 C)-101.12 F (38.4 C)] 99.86 F (37.7 C) (05/24 0700) Pulse Rate:  [68-96] 77 (05/24 0700) Resp:  [2-28] 13 (05/24 0700) BP: (126-198)/(59-98) 138/71 (05/24 0700) SpO2:  [90 %-100 %] 100 % (05/24 0700) FiO2 (%):  [40 %] 40 % (05/24 0323) Weight:  [102.6 kg] 102.6 kg (05/24 0436) Last BM Date: 08/21/20  Weight change: Filed Weights   08/25/2020 0500 08/21/20 0600 08/22/20 0436  Weight: 108.9 kg 103.2 kg 102.6 kg    Intake/Output:   Intake/Output Summary (Last 24 hours) at 08/22/2020 0814 Last data filed at 08/22/2020 0600 Gross per 24 hour  Intake 1248.49 ml  Output 3585 ml  Net -2336.51 ml      Physical Exam  CVP 10 General: Intubated/sedated Neck: JVP 8-9 cm, no thyromegaly or thyroid nodule.   Lungs: Clear to auscultation bilaterally with normal respiratory effort. CV: Nondisplaced PMI.  Heart regular S1/S2, no S3/S4, no murmur.  No peripheral edema.    Abdomen: Soft, nontender, no hepatosplenomegaly, no distention.  Skin: Intact without lesions or rashes.  Neurologic: Eyes open, moves extremities. Extremities: No clubbing or cyanosis.  HEENT: Normal.   Telemetry  A fib 70-100s (personally reviewed)  Labs    CBC Recent Labs    08/21/20 0025 08/22/20 0402  WBC 8.5 8.1  HGB 9.0* 7.8*  HCT 29.3* 26.3*  MCV 95.1 100.0  PLT 408* 099   Basic Metabolic Panel Recent Labs    08/21/20 0025 08/22/20 0402  NA 149* 153*  K 4.1 3.6  CL 116* 115*  CO2 24 23  GLUCOSE 267* 220*  BUN 74* 65*  CREATININE 2.45* 2.19*  CALCIUM 8.3* 7.8*   Liver Function Tests Recent Labs    08/20/20 0425  AST 61*  ALT 44  ALKPHOS 291*  BILITOT 1.3*  PROT 6.0*  ALBUMIN 1.7*   No results for input(s): LIPASE, AMYLASE in the last 72 hours. Cardiac Enzymes No results for input(s): CKTOTAL, CKMB, CKMBINDEX, TROPONINI in the last 72 hours.  BNP: BNP (last 3 results) Recent Labs    07/07/2020 1211  BNP 465.5*    ProBNP (last 3 results) No results for  input(s): PROBNP in the last 8760 hours.   D-Dimer No results for input(s): DDIMER in the last 72 hours. Hemoglobin A1C No results for input(s): HGBA1C in the last 72 hours. Fasting Lipid Panel No results for input(s): CHOL, HDL, LDLCALC, TRIG, CHOLHDL, LDLDIRECT in the last 72 hours. Thyroid Function Tests No results for input(s): TSH, T4TOTAL, T3FREE, THYROIDAB in the last 72 hours.  Invalid input(s): FREET3  Other results:   Imaging    MR BRAIN WO CONTRAST  Result Date: 08/22/2020 CLINICAL DATA:  Initial evaluation for acute stroke. EXAM: MRI HEAD WITHOUT CONTRAST TECHNIQUE: Multiplanar, multiecho pulse sequences of the brain and surrounding structures were obtained without intravenous contrast. COMPARISON:  Prior CT  from 08/15/2020. FINDINGS: Brain: Examination somewhat degraded by motion artifact. Generalized age-related cerebral atrophy. Patchy multifocal areas of diffusion abnormality seen involving the subcortical aspect of both cerebral hemispheres, right greater than left. For reference purposes, the largest focus is seen at the posterior right frontal centrum semi ovale and measures 9 mm (series 5, image 86). Patchy involvement of the corpus callosum including the splenium. Prominent involvement of the right occipital lobe (series 5, image 77). Additional patchy foci of diffusion abnormality seen involving the pons, right middle cerebellar peduncle, and cerebellum. Findings consistent with acute subacute ischemic infarcts. No associated hemorrhage or significant mass effect. Involvement at the right cerebellar peduncle is more subacute in appearance, and corresponds with hyperdensity seen on prior CT. Gray-white matter differentiation otherwise maintained. Scattered foci of chronic hemosiderin staining noted involving the left cerebral hemisphere, suggesting prior subarachnoid hemorrhage. No acute intracranial hemorrhage. No mass lesion, midline shift or mass effect. No hydrocephalus or extra-axial fluid collection. Pituitary gland and suprasellar region within normal limits. Midline structures intact. Vascular: Major intracranial vascular flow voids are maintained. Skull and upper cervical spine: Craniocervical junction within normal limits. Bone marrow signal intensity normal. No scalp soft tissue abnormality. Sinuses/Orbits: Patient status post bilateral ocular lens replacement. Globes and orbital soft tissues demonstrate no acute finding. Scattered mucosal thickening noted throughout the paranasal sinuses. Fluid seen layering within the nasopharynx. Bilateral mastoid effusions noted. Other: None. IMPRESSION: 1. Patchy multifocal acute to subacute ischemic infarcts involving the bilateral cerebral hemispheres, right  greater than left, as well as the pons, right middle cerebellar peduncle, and cerebellum. No associated hemorrhage or significant mass effect. 2. Underlying age-related cerebral atrophy. Electronically Signed   By: Jeannine Boga M.D.   On: 08/22/2020 04:07     Medications:     Scheduled Medications: . aspirin EC  325 mg Oral Daily   Or  . aspirin  324 mg Per Tube Daily  . chlorhexidine gluconate (MEDLINE KIT)  15 mL Mouth Rinse BID  . Chlorhexidine Gluconate Cloth  6 each Topical Daily  . darbepoetin (ARANESP) injection - NON-DIALYSIS  150 mcg Subcutaneous Q Wed-1800  . erythromycin   Right Eye Q2H  . feeding supplement (PROSource TF)  45 mL Per Tube 5 X Daily  . ferrous [id number] C-folic acid  1 capsule Oral BID PC  . fiber  1 packet Per Tube BID  . free water  200 mL Per Tube Q6H  . furosemide  60 mg Intravenous Once  . Gerhardt's butt cream   Topical QID  . insulin aspart  0-15 Units Subcutaneous Q4H  . insulin detemir  20 Units Subcutaneous BID  . isosorbide-hydrALAZINE  2 tablet Per Tube TID  . levalbuterol  0.63 mg Nebulization TID  . mouth rinse  15 mL Mouth Rinse 10 times  per day  . mexiletine  200 mg Per Tube Q12H  . nutrition supplement (JUVEN)  1 packet Per Tube BID BM  . pantoprazole sodium  40 mg Per Tube Daily  . potassium chloride  40 mEq Oral Once  . rosuvastatin  10 mg Per Tube Daily  . sodium chloride flush  10-40 mL Intracatheter Q12H  . sodium chloride flush  5 mL Intracatheter Q8H    Infusions: . sodium chloride Stopped (08/17/2020 0734)  . sodium chloride    . sodium chloride    . sodium chloride Stopped (08/22/20 0457)  . dextrose Stopped (08/17/20 1116)  . feeding supplement (VITAL 1.5 CAL) 55 mL/hr at 08/22/20 0400  . heparin 2,150 Units/hr (08/22/20 0759)  . piperacillin-tazobactam (ZOSYN)  IV 3.375 g (08/22/20 0750)    PRN Medications: sodium chloride, Place/Maintain arterial line **AND** sodium chloride, acetaminophen  (TYLENOL) oral liquid 160 mg/5 mL, fentaNYL (SUBLIMAZE) injection, polyvinyl alcohol, sodium chloride flush  Assessment/Plan   1. Cardiogenic shock: Ischemic cardiomyopathy, post-CABG on 4/26.  He has Impella 5.5 in place, down to P4 with no alarms and stable LDH.  Limited echo 4/29 with EF< 20%, the RV appears normal in size with severe systolic dysfunction.  Echo with some improvement, EF in 30-35% range on 5/9 echo. Suspect component of septic/distributive shock. Impella out 5/20. EF 30% intraop TEE 5/20.  Co-ox 64%, off milrinone. CVP 10.  - Increase to Bidil 2 tab tid.  - Lasix 60 mg IV x 1 today 2. CAD: s/p CABG x 5 with LIMA-LAD, seq SVG-D1/ramus, seq SVG-PDA/PLV.   - ASA  - Crestor 3. Anemia: Post-op bleeding, back to OR with multiple products given on 4/26 post-CABG.  Hgb 7.8, no overt bleeding. Got 1 unit PRBCs on 5/21.  4. Thrombocytopenia: Resolved. suspect low post-op/post-surgical bleeding and multiple blood products as well as sepsis.  5. PAD: Extensive history.  6. AAA: Monitoring as outpatient. 4.5 cm on CT.  7. Type 2 DM: Insulin.  Hgb A1C 11.6, poor control. On SSI  8. Atrial fibrillation: Rate controlled.  Off amio with torsades and possible QT prolongation.  - Heparin gtt.  9. Neuro: intubated and sedated, not following commands with sedation wean.  Head CT 5/6 with multiple posterior circulation infarcts. Head CT 5/10 no change. Eyes open and withdraws from pain. NH3 was not elevated.  - Neuro has seen.  - Palliative Care following.  - Eventually needs MRI head when pacing wires out.  10. ID:  PCT 7.5 on 4/29. Cultures so far negative.  Now with cholecystostomy drain for acalculous cholecystitis. Off Eraxis now and on Zosyn for biliary coverage. ID following.  Persistent fevers, Tm 100.  Lines all have been changed out.  - Continue Zosyn.  - ?Noninfectious fever  - ID following.  11. Acute hypoxemic respiratory failure: S/p tracheostomy on 5/10.  - trach management  per PCCM  12. AKI:  CVVH started with marked volume overload and worsening renal function.  Now off CVVH with good UOP and creatinine gradually decreasing, 2.19 today.   13. FEN: Tube feeds, will need PEG.  14. Torsades: Paced out by Dr Orvan Seen on 5/14. Amiodarone stopped.  - continue Mexiletine  15. GOC: was limited code and now changed back to Full Code 16. Acute acalculous cholecystitis: S/p biliary drain placement.   - Zosyn for biliary coverage.  17. Red eye: Right eye red with copious yellow-green drainage.  Discussed with ophthalmology, recommendations given.  18. Hypernatremia: Na 153.  - Increase  free water boluses 300 cc q 4hrs.      CRITICAL CARE Performed by: Loralie Champagne  Total critical care time: 35 minutes  Critical care time was exclusive of separately billable procedures and treating other patients.  Critical care was necessary to treat or prevent imminent or life-threatening deterioration.  Critical care was time spent personally by me on the following activities: development of treatment plan with patient and/or surrogate as well as nursing, discussions with consultants, evaluation of patient's response to treatment, examination of patient, obtaining history from patient or surrogate, ordering and performing treatments and interventions, ordering and review of laboratory studies, ordering and review of radiographic studies, pulse oximetry and re-evaluation of patient's condition.  Loralie Champagne 08/22/2020 8:14 AM

## 2020-08-22 NOTE — Progress Notes (Signed)
ANTICOAGULATION CONSULT NOTE  Pharmacy Consult for Heparin Indication: stroke  No Known Allergies  Patient Measurements: Height: '6\' 1"'$  (185.4 cm) Weight: 102.6 kg (226 lb 3.1 oz) IBW/kg (Calculated) : 79.9 Heparin Dosing Weight: 102.8 kg  Vital Signs: Temp: 100.04 F (37.8 C) (05/24 0500) Temp Source: Axillary (05/24 0332) BP: 155/72 (05/24 0500) Pulse Rate: 75 (05/24 0500)  Labs: Recent Labs    08/20/20 0425 08/20/20 1102 08/20/20 1545 08/21/20 0025 08/22/20 0402 08/22/20 0516  HGB 8.0*  --   --  9.0* 7.8*  --   HCT 26.4*  --   --  29.3* 26.3*  --   PLT 370  --   --  408* 351  --   HEPARINUNFRC  --    < >  --  0.38 >1.10* 0.55  CREATININE 2.82*  --  2.61* 2.45* 2.19*  --    < > = values in this interval not displayed.    Estimated Creatinine Clearance: 40.6 mL/min (A) (by C-G formula based on SCr of 2.19 mg/dL (H)).   Assessment: 70 yo M presents with NSTEMI and multivessel CAD, now s/p CABG with Impella support. Pharmacy asked to manage systemic heparin. Heparin was held following IR guided drain placement and removal of Impella 5.5. Anticoagulation to be continued for stroke.   Heparin level supratherapeutic (0.55) on gtt at 2250 units/hr upon redraw from trialysis cath. No issues with line or bleeding reported per RN.  CBC Hgb 7.8, Plt 351  Goal of Therapy:  Heparin level 0.3-0.5 units/ml Monitor platelets by anticoagulation protocol: Yes   Plan:  Decrease heparin infusion to 2150 units / hr  Order heparin level in 8 hours  F/u s/sx bleeding, CBC, HL daily F/u long term anticoag plans  Wilson Singer, PharmD PGY1 Pharmacy Resident 08/22/2020 6:40 AM

## 2020-08-22 NOTE — Progress Notes (Signed)
4 Days Post-Op Procedure(s) (LRB): REMOVAL OF IMPELLA LEFT VENTRICULAR ASSIST DEVICE (N/A) TRANSESOPHAGEAL ECHOCARDIOGRAM (TEE) (N/A) Subjective: noncommunicative  Objective: Vital signs in last 24 hours: Temp:  [98.96 F (37.2 C)-101.12 F (38.4 C)] 100.22 F (37.9 C) (05/24 1400) Pulse Rate:  [74-98] 92 (05/24 1400) Cardiac Rhythm: Atrial fibrillation (05/24 0800) Resp:  [2-35] 23 (05/24 1400) BP: (126-198)/(59-98) 158/70 (05/24 1400) SpO2:  [100 %] 100 % (05/24 1400) FiO2 (%):  [40 %] 40 % (05/24 1200) Weight:  [102.6 kg] 102.6 kg (05/24 0436)  Hemodynamic parameters for last 24 hours: CVP:  [6 mmHg-10 mmHg] 7 mmHg  Intake/Output from previous day: 05/23 0701 - 05/24 0700 In: 1248.5 [I.V.:653.4; NG/GT:445; IV Piggyback:150.1] Out: [id number] [Urine:3435; Drains:50; Stool:100] Intake/Output this shift: Total I/O In: 221.6 [I.V.:171.6; IV Piggyback:50] Out: 1300 [Urine:1300]  General appearance: no distress Neurologic: nonfocal Heart: regular rate and rhythm, S1, S2 normal, no murmur, click, rub or gallop Wound: c/d/i  Lab Results: Recent Labs    08/21/20 0025 08/22/20 0402  WBC 8.5 8.1  HGB 9.0* 7.8*  HCT 29.3* 26.3*  PLT 408* 351   BMET:  Recent Labs    08/21/20 0025 08/22/20 0402  NA 149* 153*  K 4.1 3.6  CL 116* 115*  CO2 24 23  GLUCOSE 267* 220*  BUN 74* 65*  CREATININE 2.45* 2.19*  CALCIUM 8.3* 7.8*    PT/INR: No results for input(s): LABPROT, INR in the last 72 hours. ABG    Component Value Date/Time   PHART 7.432 08/20/2020 0420   HCO3 25.8 08/20/2020 0420   TCO2 27 08/20/2020 0420   ACIDBASEDEF 2.0 08/05/2020 0400   O2SAT 63.5 08/22/2020 0402   CBG (last 3)  Recent Labs    08/22/20 0330 08/22/20 0747 08/22/20 1145  GLUCAP 185* 210* 252*    Assessment/Plan: S/P Procedure(s) (LRB): REMOVAL OF IMPELLA LEFT VENTRICULAR ASSIST DEVICE (N/A) TRANSESOPHAGEAL ECHOCARDIOGRAM (TEE) (N/A) appreciate palliative care and neurology work with  family  Acknowledge decision for comfort   LOS: 33 days    Wonda Olds 08/22/2020

## 2020-08-22 NOTE — Consult Note (Signed)
OPHTHALMOLOGY CONSULT NOTE  Date: 08/22/20 Time: 8:17 AM  Patient Name: Danny Carpenter  DOB: 1950/04/03 MRN: [id number]  Reason for Consult: Right eye redness  HPI:  This is a 70 y.o. male admitted to [facility] bed 13 for whom ophthalmology was consulted for right eye redness. The patient is currently minimally responsive and restrained.    Prior to Admission medications   Medication Sig Start Date End Date Taking? Authorizing Provider  amLODipine (NORVASC) 10 MG tablet Take 1 tablet (10 mg total) by mouth daily. 01/21/19  Yes Masoudi, Elhamalsadat, MD  aspirin EC 81 MG tablet Take 81 mg by mouth daily. Swallow whole.   Yes [provider]  clopidogrel (PLAVIX) 75 MG tablet Take 75 mg by mouth daily.   Yes [provider]  DULoxetine (CYMBALTA) 30 MG capsule Take 30 mg by mouth daily.   Yes [provider]  gabapentin (NEURONTIN) 600 MG tablet Take 600 mg by mouth 3 (three) times daily.   Yes [provider]  Insulin Degludec (TRESIBA) 100 UNIT/ML SOLN Inject 62 Units into the skin in the morning. Every morning to control blood sugar   Yes [provider]  levocetirizine (XYZAL) 5 MG tablet Take 5 mg by mouth at bedtime.   Yes [provider]  lisinopril (ZESTRIL) 40 MG tablet Take 40 mg by mouth daily.   Yes [provider]  metFORMIN (GLUCOPHAGE) 1000 MG tablet Take 1,000 mg by mouth 2 (two) times daily with a meal.   Yes [provider]  nitroGLYCERIN (NITROSTAT) 0.4 MG SL tablet Place 0.4 mg under the tongue every 5 (five) minutes as needed for chest pain.   Yes [provider]  pantoprazole (PROTONIX) 40 MG tablet Take 40 mg by mouth daily before breakfast.   Yes [provider]  rosuvastatin (CRESTOR) 20 MG tablet Take 20 mg by mouth daily.   Yes [provider]  Vitamin D, Ergocalciferol, (DRISDOL) 1.25 MG (50000 UNIT) CAPS capsule Take 50,000 Units by mouth 2 (two) times a week.   Yes [provider]    Past Medical History:  Diagnosis Date  . AAA (abdominal aortic aneurysm) without rupture ([place]) 06/11/2019   infrarenal   Last Assessment & Plan:  Uptodate.  Stable at 4.1cm Repeat visit to vascular surg in 12/2015.  Marland Kitchen Anxiety   . Arthritis   . Chronic fatigue 01/09/2015   Last Assessment & Plan:  Chronic fatigue. Suspect multifactorial including OSA, hyperglycemia (patient not taking insulin as prescribed), pain medications. No other constitutional sx.  Will plan for repeat lab work.   - follow up in 1 mth after sleep study results.  . Chronic pain 04/05/2013   Last Assessment & Plan:   Known Degenerative disc disease and bilateral peripheral neuropathic leg pain   continue cymbalta '60mg'$   reports has trialed injections in past as well as  '5mg'$  hydrocodone with no success. Also tried tramadol in the past with no benefit.  Prescribed Norco 7.5/'325mg'$  Hydrocodone/ Acetaminophen 1-1.5 tabs every 8 hours prn #150. 1 months through 04/17/14  UTox 06/14/14 app  . COPD (chronic obstructive pulmonary disease) ([place])   . CVA (cerebral vascular accident) ([place])   . DDD (degenerative disc disease) 10/08/2004  . Depression   . Diabetic foot infection ([place]) 01/17/2019  . Fracture of rib    Left  . GERD (gastroesophageal reflux disease)   . HTN (hypertension)   . Hypercholesteremia   . Hyperlipidemia 06/11/2019   On Lipitor '80mg'$  per day  Last Assessment & Plan:  PLAN: HLD continue Lipitor '80mg'$  daily  . Osteomyelitis ([place]) 08/05/2019   left foot  . PAD (peripheral artery disease) ([place]) 01/27/2019  . Peripheral vascular disease ([place])   . PONV (postoperative nausea and vomiting)   . PVD (peripheral vascular disease) ([place])   . Seizure [facility])    one time in Oct. 2020  . Senile nuclear sclerosis 12/06/2011  . Sleep apnea    no cpap  . Subacute osteomyelitis of left foot ([place])   . Type 2 diabetes mellitus with other specified complication [facility])     family history includes Cancer in his  father and mother.  Social History   Occupational History  . Occupation: Disabled Administrator  Tobacco Use  . Smoking status: Former Smoker    Packs/day: 1.00    Years: 40.00    Pack years: 40.00    Types: Cigarettes    Quit date: 04/01/2009    Years since quitting: 11.4  . Smokeless tobacco: Never Used  Vaping Use  . Vaping Use: Never used  Substance and Sexual Activity  . Alcohol use: Not Currently  . Drug use: Never  . Sexual activity: Not on file    No Known Allergies  ROS: Positive as above, otherwise negative.  EXAM:  Mental Status: Reacts to discomfort  Base Exam: Right Eye Left Eye  Visual Acuity (At near) Responds to light 20/Responds to light  IOP ([place]) 21 17  Pupillary Exam No RAPD  No RAPD  Motility Appears full to gentle dolls head.  Appears full to gentle dolls head.   Confrontation VF     Anterior Segment Exam    Lids/Lashes WNL WNL  Conjuctiva 2+ injection Trace injection  Cornea Highly irregular but intact epithelium Inferior PEE  Anterior Chamber Deep and Quiet Deep and Quiet  Iris Round, Reactive Round, Reactive  Lens IOL IOL  Vitreous     Poster Segment Exam Good RR OU                        Radiographic Studies Reviewed:  MR BRAIN WO CONTRAST  Result Date: 08/22/2020 CLINICAL DATA:  Initial evaluation for acute stroke. EXAM: MRI HEAD WITHOUT CONTRAST TECHNIQUE: Multiplanar, multiecho pulse sequences of the brain and surrounding structures were obtained without intravenous contrast. COMPARISON:  Prior CT from 08/15/2020. FINDINGS: Brain: Examination somewhat degraded by motion artifact. Generalized age-related cerebral atrophy. Patchy multifocal areas of diffusion abnormality seen involving the subcortical aspect of both cerebral hemispheres, right greater than left. For reference purposes, the largest focus is seen at the posterior right frontal centrum semi ovale and measures 9 mm (series 5, image 86). Patchy involvement of the corpus  callosum including the splenium. Prominent involvement of the right occipital lobe (series 5, image 77). Additional patchy foci of diffusion abnormality seen involving the pons, right middle cerebellar peduncle, and cerebellum. Findings consistent with acute subacute ischemic infarcts. No associated hemorrhage or significant mass effect. Involvement at the right cerebellar peduncle is more subacute in appearance, and corresponds with hyperdensity seen on prior CT. Gray-white matter differentiation otherwise maintained. Scattered foci of chronic hemosiderin staining noted involving the left cerebral hemisphere, suggesting prior subarachnoid hemorrhage. No acute intracranial hemorrhage. No mass lesion, midline shift or mass effect. No hydrocephalus or extra-axial fluid collection. Pituitary gland and suprasellar region within normal limits. Midline structures intact. Vascular: Major intracranial vascular flow voids are maintained. Skull and upper cervical spine: Craniocervical junction within normal limits. Bone  marrow signal intensity normal. No scalp soft tissue abnormality. Sinuses/Orbits: Patient status post bilateral ocular lens replacement. Globes and orbital soft tissues demonstrate no acute finding. Scattered mucosal thickening noted throughout the paranasal sinuses. Fluid seen layering within the nasopharynx. Bilateral mastoid effusions noted. Other: None. IMPRESSION: 1. Patchy multifocal acute to subacute ischemic infarcts involving the bilateral cerebral hemispheres, right greater than left, as well as the pons, right middle cerebellar peduncle, and cerebellum. No associated hemorrhage or significant mass effect. 2. Underlying age-related cerebral atrophy. Electronically Signed   By: Jeannine Boga M.D.   On: 08/22/2020 04:07   DG CHEST PORT 1 VIEW  Result Date: 08/21/2020 CLINICAL DATA:  Ventilator dependence. EXAM: PORTABLE CHEST 1 VIEW COMPARISON:  08/16/2020 FINDINGS: Tracheostomy tube appears  to be appropriately positioned. Impella left ventricular assist device has been removed. Right jugular central line is in the upper SVC region and stable. Left arm PICC line tip in the SVC and stable. Left subclavian central line has been removed. Heart remains enlarged. There is a feeding tube that extends into the abdomen but the tip is beyond the image. Slightly prominent interstitial lung densities bilaterally. Old left rib fractures. Again noted are multiple metallic densities overlying the left lower chest. IMPRESSION: 1. Slightly increased interstitial lung densities. Findings could represent mild edema. 2. Support apparatuses as described. Electronically Signed   By: Markus Daft M.D.   On: 08/21/2020 08:04    Assessment and Recommendation: 1. Lagophthalmos OU: No signs of infectious keratitis at this point, however, patients eyes must remain closed (tapped) and lubricated Q2H erythromycin ointment both eyes until condition improves. Time spent with nursing staff this AM to review appropriate measures. Please contact ophthalmology if condition of the eyes fails to improve.   Please call with any questions.  Jola Schmidt MD [facility] Ophthalmology [phone]

## 2020-08-22 NOTE — Progress Notes (Signed)
SLP Cancellation Note  Patient Details Name: Danny Carpenter MRN: [id number] DOB: 05/22/1950   Cancelled treatment:       Reason Eval/Treat Not Completed: Other (comment) Pt on TC today, but still not responsive per RN. RN also shares that family is currently in the process of speaking with medical providers about [place] in light of MRI results. Palliative care is planning to come this afternoon - RN will reach out after this meeting and PMV can still be completed if indicated.     Osie Bond., M.A. [facility] Pager [phone] Office [phone]  08/22/2020, 2:01 PM

## 2020-08-23 DIAGNOSIS — R570 Cardiogenic shock: Secondary | ICD-10-CM | POA: Diagnosis not present

## 2020-08-23 DIAGNOSIS — K117 Disturbances of salivary secretion: Secondary | ICD-10-CM | POA: Diagnosis not present

## 2020-08-23 DIAGNOSIS — K810 Acute cholecystitis: Secondary | ICD-10-CM | POA: Diagnosis not present

## 2020-08-23 DIAGNOSIS — R0600 Dyspnea, unspecified: Secondary | ICD-10-CM | POA: Diagnosis not present

## 2020-08-23 DIAGNOSIS — I5043 Acute on chronic combined systolic (congestive) and diastolic (congestive) heart failure: Secondary | ICD-10-CM | POA: Diagnosis not present

## 2020-08-23 DIAGNOSIS — Z951 Presence of aortocoronary bypass graft: Secondary | ICD-10-CM | POA: Diagnosis not present

## 2020-08-23 DIAGNOSIS — Z66 Do not resuscitate: Secondary | ICD-10-CM | POA: Diagnosis not present

## 2020-08-23 DIAGNOSIS — Z515 Encounter for palliative care: Secondary | ICD-10-CM | POA: Diagnosis not present

## 2020-08-23 DIAGNOSIS — I5023 Acute on chronic systolic (congestive) heart failure: Secondary | ICD-10-CM | POA: Diagnosis not present

## 2020-08-23 LAB — CBC
HCT: 25.6 % — ABNORMAL LOW (ref 39.0–52.0)
Hemoglobin: 7.3 g/dL — ABNORMAL LOW (ref 13.0–17.0)
MCH: 29.2 pg (ref 26.0–34.0)
MCHC: 28.5 g/dL — ABNORMAL LOW (ref 30.0–36.0)
MCV: 102.4 fL — ABNORMAL HIGH (ref 80.0–100.0)
Platelets: 346 10*3/uL (ref 150–400)
RBC: 2.5 MIL/uL — ABNORMAL LOW (ref 4.22–5.81)
RDW: 19.5 % — ABNORMAL HIGH (ref 11.5–15.5)
WBC: 7.8 10*3/uL (ref 4.0–10.5)
nRBC: 0 % (ref 0.0–0.2)

## 2020-08-23 LAB — AEROBIC/ANAEROBIC CULTURE W GRAM STAIN (SURGICAL/DEEP WOUND): Culture: NO GROWTH

## 2020-08-23 LAB — COOXEMETRY PANEL
Carboxyhemoglobin: 1.7 % — ABNORMAL HIGH (ref 0.5–1.5)
Methemoglobin: 1.1 % (ref 0.0–1.5)
O2 Saturation: 65.2 %
Total hemoglobin: 7.7 g/dL — ABNORMAL LOW (ref 12.0–16.0)

## 2020-08-23 LAB — HEPARIN LEVEL (UNFRACTIONATED): Heparin Unfractionated: 0.59 IU/mL (ref 0.30–0.70)

## 2020-08-23 LAB — BASIC METABOLIC PANEL
Anion gap: 10 (ref 5–15)
BUN: 74 mg/dL — ABNORMAL HIGH (ref 8–23)
CO2: 25 mmol/L (ref 22–32)
Calcium: 8.3 mg/dL — ABNORMAL LOW (ref 8.9–10.3)
Chloride: 120 mmol/L — ABNORMAL HIGH (ref 98–111)
Creatinine, Ser: 2.24 mg/dL — ABNORMAL HIGH (ref 0.61–1.24)
GFR, Estimated: 31 mL/min — ABNORMAL LOW (ref >60)
Glucose, Bld: 170 mg/dL — ABNORMAL HIGH (ref 70–99)
Potassium: 4.3 mmol/L (ref 3.5–5.1)
Sodium: 155 mmol/L — ABNORMAL HIGH (ref 135–145)

## 2020-08-23 LAB — GLUCOSE, CAPILLARY
Glucose-Capillary: 168 mg/dL — ABNORMAL HIGH (ref 70–99)
Glucose-Capillary: 143 mg/dL — ABNORMAL HIGH (ref 70–99)

## 2020-08-23 MED ORDER — LORAZEPAM 2 MG/ML IJ SOLN
1.0000 mg | Freq: Four times a day (QID) | INTRAMUSCULAR | Status: DC
  Administered 2020-08-23 – 2020-08-24 (×6): 1 mg via INTRAVENOUS
  Filled 2020-08-23 (×6): qty 1

## 2020-08-23 MED ORDER — GLYCOPYRROLATE 0.2 MG/ML IJ SOLN
0.4000 mg | Freq: Three times a day (TID) | INTRAMUSCULAR | Status: DC
  Administered 2020-08-23 – 2020-08-24 (×5): 0.4 mg via INTRAVENOUS
  Filled 2020-08-23 (×5): qty 2

## 2020-08-23 MED ORDER — FENTANYL 2500MCG IN NS 250ML (10MCG/ML) PREMIX INFUSION
0.0000 ug/h | INTRAVENOUS | Status: DC
  Administered 2020-08-23 – 2020-08-24 (×6): 400 ug/h via INTRAVENOUS
  Filled 2020-08-23 (×10): qty 250

## 2020-08-23 MED ORDER — LEVALBUTEROL HCL 0.63 MG/3ML IN NEBU: 0.6300 mg | INHALATION_SOLUTION | Freq: Four times a day (QID) | RESPIRATORY_TRACT | Status: DC | PRN

## 2020-08-23 NOTE — Progress Notes (Addendum)
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 calcific              +----------+--------+--------+--------+------------------------------+--------+ ICA Distal100     28                                                      +----------+--------+--------+--------+------------------------------+--------+ ECA       53      5               heterogenous and calcific              +----------+--------+--------+--------+------------------------------+--------+ +----------+--------+--------+----------------+------------+ SubclavianPSV cm/sEDV cm/sDescribe        Arm Pressure +----------+--------+--------+----------------+------------+           153             Multiphasic, WNL             +----------+--------+--------+----------------+------------+ +---------+--------+--+--------+--+---------+ VertebralPSV cm/s39EDV cm/s11Antegrade +---------+--------+--+--------+--+---------+  ABI Findings: +---------+------------------+-----+----------+--------+ Right    Rt Pressure (mmHg)IndexWaveform  Comment  +---------+------------------+-----+----------+--------+ Brachial 126                    triphasic          +---------+------------------+-----+----------+--------+ PTA      106               0.84 monophasic         +---------+------------------+-----+----------+--------+ DP       98                0.78 monophasic         +---------+------------------+-----+----------+--------+ [place] Campbell               1.09                    +---------+------------------+-----+----------+--------+ +---------+------------------+-----+----------+--------------------------------+ Left     Lt Pressure (mmHg)IndexWaveform  Comment                          +---------+------------------+-----+----------+--------------------------------+ Brachial 120                    triphasic                                  +---------+------------------+-----+----------+--------------------------------+ PTA      128               1.02 monophasic                                 +---------+------------------+-----+----------+--------------------------------+ DP       112               0.89 monophasic                                  +---------+------------------+-----+----------+--------------------------------+ Great Toe                                 Unable to obtain pressure due to  amputation                       +---------+------------------+-----+----------+--------------------------------+ +-------+---------------+----------------+ ABI/TBIToday's ABI/TBIPrevious ABI/TBI +-------+---------------+----------------+ Right  0.84/1.09      0.73/0.69        +-------+---------------+----------------+ Left   1.02/-         0.89/0.38        +-------+---------------+----------------+  Right Doppler Findings: +-----------+--------+-----+---------+-----------------------------------------+ Site       PressureIndexDoppler  Comments                                  +-----------+--------+-----+---------+-----------------------------------------+ Brachial   126          triphasic                                          +-----------+--------+-----+---------+-----------------------------------------+ Radial                  triphasic                                          +-----------+--------+-----+---------+-----------------------------------------+ Ulnar                   triphasic                                          +-----------+--------+-----+---------+-----------------------------------------+ Palmar Arch                      Signal obliterates with radial                                             compression, is unaffected with ulnar                                      compression                               +-----------+--------+-----+---------+-----------------------------------------+  Left Doppler Findings: +-----------+--------+-----+---------+-----------------------------------------+ Site       PressureIndexDoppler  Comments                                   +-----------+--------+-----+---------+-----------------------------------------+ Brachial   120          triphasic                                          +-----------+--------+-----+---------+-----------------------------------------+ Radial                  triphasic                                          +-----------+--------+-----+---------+-----------------------------------------+  Ulnar                   triphasic                                          +-----------+--------+-----+---------+-----------------------------------------+ Palmar Arch                      Signal is unaffected with radial                                           compression, decreases 50% with ulnar                                      compression.                              +-----------+--------+-----+---------+-----------------------------------------+  Summary: Right Carotid: Velocities in the right ICA are consistent with a 1-39% stenosis. Left Carotid: Velocities in the left ICA are consistent with a 40-59% stenosis. Vertebrals:  Bilateral vertebral arteries demonstrate antegrade flow. Subclavians: Normal flow hemodynamics were seen in bilateral subclavian              arteries. Right ABI: Resting right ankle-brachial index indicates mild right lower extremity arterial disease. The right toe-brachial index is normal. Left ABI: Resting left ankle-brachial index is within normal range. No evidence of significant left lower extremity arterial disease. Right Upper Extremity: No significant arterial obstruction detected in the right upper extremity. Left Upper Extremity: No significant arterial obstruction detected in the left upper extremity.  Electronically signed by Ruta Hinds MD on 07/24/2020 at 4:12:13 PM.    Final    VAS [place] LOWER EXTREMITY VENOUS (DVT)  Result Date: 08/16/2020  Lower Venous DVT Study Patient Name:  [facility] [place]  Date of Exam:   08/16/2020 Medical Rec #: [id number]       Accession #:    [id number] Date of Birth: [birth year]     Patient Gender: M Patient Age:   69Y Exam Location:  [facility] Procedure:      VAS [place] LOWER EXTREMITY VENOUS (DVT) Referring Phys: [id number] [place] --------------------------------------------------------------------------------  Indications: Edema.  Comparison Study: 08/11/20 previous Performing Technologist: Abram Sander RVS  Examination Guidelines: A complete evaluation includes B-mode imaging, spectral Doppler, color Doppler, and power Doppler as needed of all accessible portions of each vessel. Bilateral testing is considered an integral part of a complete examination. Limited examinations for reoccurring indications may be performed as noted. The reflux portion of the exam is performed with the patient in reverse Trendelenburg.  +---------+---------------+---------+-----------+----------+-------------------+ RIGHT    CompressibilityPhasicitySpontaneityPropertiesThrombus Aging      +---------+---------------+---------+-----------+----------+-------------------+ CFV      Full           Yes      Yes                                      +---------+---------------+---------+-----------+----------+-------------------+ SFJ      Full                                                             +---------+---------------+---------+-----------+----------+-------------------+  FV Prox  Full                                                             +---------+---------------+---------+-----------+----------+-------------------+ FV Mid   Full                                                             +---------+---------------+---------+-----------+----------+-------------------+ FV DistalFull                                                             +---------+---------------+---------+-----------+----------+-------------------+ PFV      Full                                                              +---------+---------------+---------+-----------+----------+-------------------+ POP      Full           Yes      Yes                                      +---------+---------------+---------+-----------+----------+-------------------+ PTV      Full                                                             +---------+---------------+---------+-----------+----------+-------------------+ PERO                                                  Not well visualized +---------+---------------+---------+-----------+----------+-------------------+   +---------+---------------+---------+-----------+----------+-------------------+ LEFT     CompressibilityPhasicitySpontaneityPropertiesThrombus Aging      +---------+---------------+---------+-----------+----------+-------------------+ CFV      Full           Yes      Yes                                      +---------+---------------+---------+-----------+----------+-------------------+ SFJ      Full                                                             +---------+---------------+---------+-----------+----------+-------------------+ FV Prox  Full                                                             +---------+---------------+---------+-----------+----------+-------------------+  FV Mid   Full                                                             +---------+---------------+---------+-----------+----------+-------------------+ FV DistalFull                                                             +---------+---------------+---------+-----------+----------+-------------------+ PFV      Full                                                             +---------+---------------+---------+-----------+----------+-------------------+ POP      Full           Yes      Yes                                       +---------+---------------+---------+-----------+----------+-------------------+ PTV      Full                                                             +---------+---------------+---------+-----------+----------+-------------------+ PERO                                                  Not well visualized +---------+---------------+---------+-----------+----------+-------------------+     Summary: BILATERAL: - No evidence of deep vein thrombosis seen in the lower extremities, bilaterally. -No evidence of popliteal cyst, bilaterally.   *See table(s) above for measurements and observations. Electronically signed by Curt Jews MD on 08/16/2020 at 8:37:02 PM.    Final    VAS [place] LOWER EXTREMITY VENOUS (DVT)  Result Date: 08/11/2020  Lower Venous DVT Study Patient Name:  [facility] [place]  Date of Exam:   08/11/2020 Medical Rec #: [id number]      Accession #:    [id number] Date of Birth: 10/27/1950     Patient Gender: M Patient Age:   69Y Exam Location:  [facility] Procedure:      VAS [place] LOWER EXTREMITY VENOUS (DVT) Referring Phys: [id number] [place] --------------------------------------------------------------------------------  Indications: Edema. Other Indications: Status post CABG 4/26 with right leg vein harvested. Limitations: Poor ultrasound/tissue interface. Comparison Study: No prior Performing Technologist: Oda Cogan RDMS, RVT  Examination Guidelines: A complete evaluation includes B-mode imaging, spectral Doppler, color Doppler, and power Doppler as needed of all accessible portions of each vessel. Bilateral testing is considered an integral part of a complete examination. Limited examinations for reoccurring indications may be performed as noted. The reflux portion of the exam is performed with the  patient in reverse Trendelenburg.  +---------+---------------+---------+-----------+----------+--------------+ RIGHT    CompressibilityPhasicitySpontaneityPropertiesThrombus  Aging +---------+---------------+---------+-----------+----------+--------------+ CFV      Full           Yes      Yes                                 +---------+---------------+---------+-----------+----------+--------------+ SFJ      Full                                                        +---------+---------------+---------+-----------+----------+--------------+ FV Prox  Full                                                        +---------+---------------+---------+-----------+----------+--------------+ FV Mid   Full                                                        +---------+---------------+---------+-----------+----------+--------------+ FV DistalFull                                                        +---------+---------------+---------+-----------+----------+--------------+ PFV      Full                                                        +---------+---------------+---------+-----------+----------+--------------+ POP      Full           Yes      Yes                                 +---------+---------------+---------+-----------+----------+--------------+ PTV      Full                                                        +---------+---------------+---------+-----------+----------+--------------+ PERO     Full                                                        +---------+---------------+---------+-----------+----------+--------------+ Hematomas are noted along the incision site in the thigh area.  +---------+---------------+---------+-----------+----------+--------------+ LEFT     CompressibilityPhasicitySpontaneityPropertiesThrombus Aging +---------+---------------+---------+-----------+----------+--------------+ CFV      Full           Yes  Yes                                 +---------+---------------+---------+-----------+----------+--------------+ SFJ      Full                                                         +---------+---------------+---------+-----------+----------+--------------+ FV Prox  Full                                                        +---------+---------------+---------+-----------+----------+--------------+ FV Mid   Full                                                        +---------+---------------+---------+-----------+----------+--------------+ FV DistalFull                                                        +---------+---------------+---------+-----------+----------+--------------+ PFV      Full                                                        +---------+---------------+---------+-----------+----------+--------------+ POP      Full           Yes      Yes                                 +---------+---------------+---------+-----------+----------+--------------+ PTV      Full                                                        +---------+---------------+---------+-----------+----------+--------------+ PERO     Full                                                        +---------+---------------+---------+-----------+----------+--------------+     Summary: BILATERAL: - No evidence of deep vein thrombosis seen in the lower extremities, bilaterally. -No evidence of popliteal cyst, bilaterally.   *See table(s) above for measurements and observations. Electronically signed by Servando Snare MD on 08/11/2020 at 4:56:30 PM.    Final    ECHOCARDIOGRAM LIMITED  Result Date: 08/15/2020    ECHOCARDIOGRAM LIMITED REPORT   Patient Name:   [facility] [place] Date of Exam: 08/15/2020 Medical Rec #:  [id number]     Height:       73.0 in Accession #:    [id number]    Weight:       243.4 lb Date of Birth:  1950-08-17    BSA:          2.339 m Patient Age:    89 years      BP:           130/67 mmHg Patient Gender: M             HR:           80 bpm. Exam Location:  Inpatient Procedure: Limited Echo Indications:    CHF-Acute Systolic [id number]   History:        Patient has prior history of Echocardiogram examinations, most                 recent 08/10/2020. Signs/Symptoms:Fever. Cardiogenic shock:                 Ischemic cardiomyopathy, post-CABG on 4/26. Impella 5.5 down to                 P4. Thrombocytopenia, Anemia, AAA, Acute kidney injury.  Sonographer:    Darlina Sicilian RDCS Referring Phys: [place]  Conclusion(s)/Recommendation(s): LIMITED ECHO for Impella cannula placement. Left Ventricle: Left ventricular ejection fraction, by estimation, is <20%. The left ventricle has severely decreased function. Compared to prior, Similar cannular placement (5.2 cm from the annulus). Rudean Haskell MD Electronically signed by Rudean Haskell MD Signature Date/Time: 08/15/2020/11:01:39 AM    Final    ECHOCARDIOGRAM LIMITED  Result Date: 08/29/2020    ECHOCARDIOGRAM LIMITED REPORT   Patient Name:   [facility] Date of Exam: 08/07/2020 Medical Rec #:  [id number]     Height:       73.0 in Accession #:    [id number]    Weight:       259.5 lb Date of Birth:  Sep 17, 1950    BSA:          2.403 m Patient Age:    67 years      BP:           138/63 mmHg Patient Gender: M             HR:           74 bpm. Exam Location:  Inpatient Procedure: Limited Echo Indications:    Impella positioning  History:        Patient has prior history of Echocardiogram examinations, most                 recent 08/03/2020. CHF and Cardiomyopathy, CAD, Prior CABG, PAD;                 Risk Factors:Hypertension. Cardiogenic shock.  Sonographer:    Dustin Flock Referring Phys: [id number] [place] ECHO for Impella cannula placement. Sanda Klein MD Electronically signed by Sanda Klein MD Signature Date/Time: 08/09/2020/9:06:09 AM    Final    ECHOCARDIOGRAM LIMITED  Result Date: 08/03/2020    ECHOCARDIOGRAM LIMITED REPORT   Patient Name:   [facility] [place] Date of Exam: 08/03/2020 Medical Rec #:  [id number]     Height:       73.0 in Accession #:     [id number]    Weight:       276.2 lb Date of Birth:  1950/04/27    BSA:  2.468 m Patient Age:    17 years      BP:           118/79 mmHg Patient Gender: M             HR:           73 bpm. Exam Location:  Inpatient Procedure: Limited Echo and Color Doppler Indications:    I50.23 Acute on chronic systolic (congestive) heart failure  History:        Patient has prior history of Echocardiogram examinations, most                 recent 08/02/2020. COPD, Stroke and PAD; Risk                 Factors:Hypertension, Diabetes, Dyslipidemia and Sleep Apnea.                 Seizures. AAA. GERD.  Sonographer:    Jonelle Sidle Dance Referring Phys: [place]  1. Left ventricular ejection fraction, by estimation, is <20%. The left ventricle has severely decreased function. Limited echo for Impella positioning. Compared to prior study support device has been pulled out of the left ventircle. Measured at 4.75 cm from the annulus. FINDINGS  Left Ventricle: Left ventricular ejection fraction, by estimation, is <20%. The left ventricle has severely decreased function. Rudean Haskell MD Electronically signed by Rudean Haskell MD Signature Date/Time: 08/03/2020/5:58:54 PM    Final    ECHOCARDIOGRAM LIMITED  Result Date: 08/02/2020    ECHOCARDIOGRAM LIMITED REPORT   Patient Name:   [facility] [place] Date of Exam: 08/02/2020 Medical Rec #:  [id number]     Height:       73.0 in Accession #:    [id number]    Weight:       282.0 lb Date of Birth:  26-Nov-1950    BSA:          2.490 m Patient Age:    89 years      BP:           141/89 mmHg Patient Gender: M             HR:           92 bpm. Exam Location:  Inpatient Procedure: Limited Echo Indications:    Impella positioning  History:        Patient has prior history of Echocardiogram examinations, most                 recent 07/31/2020. CHF and Cardiomyopathy, CAD, Prior CABG, PAD,                 Arrythmias:Atrial Fibrillation; Risk Factors:Diabetes.  Sonographer:     Dustin Flock Referring Phys: [place]  1. Limited echo for Impella positioning EF 20% no effusion Normal RV size Impella seems to be positioned too deep as far as 7 cm distal to aortic annulus with turbulance from outflow near aortic annulus. FINDINGS  Additional Comments: Limited echo for Impella positioning EF 20% no effusion Normal RV size Impella seems to be positioned too deep as far as 7 cm distal to aortic annulus with turbulance from outflow near aortic annulus. Jenkins Rouge MD Electronically signed by Jenkins Rouge MD Signature Date/Time: 08/02/2020/10:52:34 AM    Final    ECHOCARDIOGRAM LIMITED  Result Date: 07/31/2020    ECHOCARDIOGRAM LIMITED REPORT   Patient Name:   [facility] [place] Date of Exam: 07/31/2020 Medical Rec #:  [id number]     Height:       73.0 in Accession #:    [id number]    Weight:       280.9 lb Date of Birth:  Nov 14, 1950    BSA:          2.486 m Patient Age:    30 years      BP:           113/81 mmHg Patient Gender: M             HR:           89 bpm. Exam Location:  Inpatient Procedure: Limited Echo Indications:    [id number] Acute systolic (congestive) heart failure  History:        Patient has prior history of Echocardiogram examinations, most                 recent 07/29/2020. Prior CABG; Risk Factors:Hypertension,                 Diabetes and Dyslipidemia.  Sonographer:    Merrie Roof RDCS Referring Phys: [place] Comments: This was a limited echo for Impella position. The Impella device was not moved. IMPRESSIONS  1. Left ventricular ejection fraction, by estimation, is <20%. The left ventricle has severely decreased function with dyskinetic septum. The left ventricular internal cavity size was mildly dilated. Impella 5.5 catheter present with tip at 5.3 cm from the aortic valve.  2. Right ventricular systolic function is moderately reduced. The right ventricular size is normal. FINDINGS  Left Ventricle: Left ventricular ejection  fraction, by estimation, is <20%. The left ventricle has severely decreased function. The left ventricular internal cavity size was mildly dilated. There is no left ventricular hypertrophy. Right Ventricle: The right ventricular size is normal. Right ventricular systolic function is moderately reduced. Loralie Champagne MD Electronically signed by Loralie Champagne MD Signature Date/Time: 07/31/2020/1:52:44 PM    Final    ECHOCARDIOGRAM LIMITED  Result Date: 07/29/2020    ECHOCARDIOGRAM LIMITED REPORT   Patient Name:   [facility] [place] Date of Exam: 07/29/2020 Medical Rec #:  [id number]     Height:       73.0 in Accession #:    [id number]    Weight:       272.5 lb Date of Birth:  1951/01/21    BSA:          2.454 m Patient Age:    74 years      BP:           98/75 mmHg Patient Gender: M             HR:           66 bpm. Exam Location:  Inpatient Procedure: Limited Echo                                STAT ECHO  Dr. Loralie Champagne present at bedside for Impella placement measurements. Indications:    CHF-Acute Systolic [id number]                 Impella placement  History:        Patient has prior history of Echocardiogram examinations, most                 recent 07/28/2028. CHF, CAD, Prior CABG; Risk  Factors:Hypertension, Diabetes and Dyslipidemia. Stroke. Cardiac                 shock. Impella device.  Sonographer:    Clayton Lefort RDCS (AE) Referring Phys: [id number] [facility]  Sonographer Comments: Technically difficult study due to poor echo windows, patient is morbidly obese and echo performed with patient supine and on artificial respirator. Image acquisition challenging due to patient body habitus. IMPRESSIONS  1. Impella placement - 2 images. Initial depth of 5.11 cm past the aortic valve appears adequate. FINDINGS  Left Ventricle: Impella placement - 2 images. Initial depth of 5.11 cm past the aortic valve appears adequate. Lyman Bishop MD Electronically signed by Lyman Bishop MD Signature Date/Time:  07/29/2020/11:55:05 AM    Final    ECHOCARDIOGRAM LIMITED  Result Date: 07/28/2020    ECHOCARDIOGRAM LIMITED REPORT   Patient Name:   [facility] [place] Date of Exam: 07/28/2020 Medical Rec #:  [id number]     Height:       73.0 in Accession #:    [id number]    Weight:       272.5 lb Date of Birth:  10/02/1950    BSA:          2.454 m Patient Age:    2 years      BP:           90/56 mmHg Patient Gender: M             HR:           86 bpm. Exam Location:  Inpatient Procedure: Limited Echo and Color Doppler Indications:    I50.40* Unspecified combined systolic (congestive) and diastolic                 (congestive) heart failure.Limited echo for Impella placemant.  History:        Patient has prior history of Echocardiogram examinations, most                 recent 07/26/2020. CHF, CAD, Prior CABG, Stroke; Risk                 Factors:Hypertension, Diabetes and Dyslipidemia. Cardiac shock.                 Impella device.  Sonographer:    Roseanna Rainbow RDCS Referring Phys: [id number] AMY D CLEGG  Sonographer Comments: Technically difficult study due to poor echo windows and echo performed with patient supine and on artificial respirator. Image acquisition challenging due to patient body habitus. IMPRESSIONS  1. Impella cathteter noted across the aortic valve and in the left ventricle. Initially 5.9 cm distal to the aortic valve annulus. The catheter was withdrawn to 4.8 cm. Left ventricular ejection fraction, by estimation, is <20%. The left ventricle has severely decreased function. The left ventricle demonstrates global hypokinesis.  2. Right ventricular systolic function is severely reduced. The right ventricular size is normal.  3. The mitral valve is grossly normal.  4. The aortic valve was not well visualized. FINDINGS  Left Ventricle: Impella cathteter noted across the aortic valve and in the left ventricle. Initially 5.9 cm distal to the aortic valve annulus. The catheter was withdrawn to 4.8 cm. Left ventricular ejection  fraction, by estimation, is <20%. The left ventricle has severely decreased function. The left ventricle demonstrates global hypokinesis. The left ventricular internal cavity size was normal in size. Right Ventricle: The right ventricular size is normal. No increase in right ventricular wall thickness. Right ventricular systolic  function is severely reduced. Left Atrium: Left atrial size was not assessed. Right Atrium: Right atrial size was not assessed. Pericardium: There is no evidence of pericardial effusion. Mitral Valve: The mitral valve is grossly normal. Aortic Valve: The aortic valve was not well visualized. Skeet Latch MD Electronically signed by Skeet Latch MD Signature Date/Time: 07/28/2020/11:58:19 AM    Final    ECHOCARDIOGRAM LIMITED  Result Date: 07/26/2020    ECHOCARDIOGRAM LIMITED REPORT   Patient Name:   [facility] Yutzy Date of Exam: 07/26/2020 Medical Rec #:  [id number]     Height:       73.0 in Accession #:    [id number]    Weight:       280.0 lb Date of Birth:  1950/07/21    BSA:          2.482 m Patient Age:    3 years      BP:           99/76 mmHg Patient Gender: M             HR:           92 bpm. Exam Location:  Inpatient Procedure: Limited Echo Indications:    I50.23 Acute on chronic systolic (congestive) heart failure  History:        Patient has prior history of Echocardiogram examinations, most                 recent 07/22/2020. COPD, Stroke and PAD; Risk                 Factors:Hypertension, Diabetes, Dyslipidemia, Sleep Apnea and                 GERD. Abdominal Aortic Aneurysm.  Sonographer:    Tiffany Dance Referring Phys: [id number] AMY D CLEGG IMPRESSIONS  1. Limited echo to evaluate impella positioning. Impella measures 4.9 cm from aortic valve  2. Left ventricular ejection fraction, by estimation, is <20%. The left ventricle has severely decreased function.  3. Right ventricular systolic function is severely reduced. The right ventricular size is normal. FINDINGS  Left  Ventricle: Left ventricular ejection fraction, by estimation, is <20%. The left ventricle has severely decreased function. Right Ventricle: The right ventricular size is normal. Right ventricular systolic function is severely reduced. Oswaldo Milian MD Electronically signed by Oswaldo Milian MD Signature Date/Time: 07/26/2020/10:48:24 AM    Final    [place] EKG SITE RITE  Result Date: 08/15/2020 If Site Rite image not attached, placement could not be confirmed due to current cardiac rhythm.  [place] EKG SITE RITE  Result Date: 08/02/2020 If Site Rite image not attached, placement could not be confirmed due to current cardiac rhythm.  CT CHEST ABDOMEN PELVIS WO CONTRAST  Result Date: 08/15/2020 CLINICAL DATA:  Sepsis, recent CABG EXAM: CT CHEST, ABDOMEN AND PELVIS WITHOUT CONTRAST TECHNIQUE: Multidetector CT imaging of the chest, abdomen and pelvis was performed following the standard protocol without IV contrast. COMPARISON:  09/28/2018 FINDINGS: CT CHEST FINDINGS Cardiovascular: Aortic atherosclerosis. Cardiomegaly status post median sternotomy and CABG. Extensive 3 vessel coronary artery calcifications and/or stents. Right subclavian approach Impella device position within the left ventricle. Right internal jugular and left subclavian venous catheters. No pericardial effusion. Mediastinum/Nodes: No enlarged mediastinal, hilar, or axillary lymph nodes. Tracheostomy. Thyroid and esophagus demonstrate no significant findings. Lungs/Pleura: Small, right greater than left bilateral pleural effusions and associated atelectasis or consolidation. Musculoskeletal: No chest wall mass or suspicious bone lesions identified. Status post median sternotomy and  CABG with postoperative retrosternal stranding and fluid (series 3, image 26). There is no discrete fluid collection identified. Superficial metallic pellets in the left anterior chest wall. CT ABDOMEN PELVIS FINDINGS Hepatobiliary: No solid liver abnormality  is seen. The gallbladder is mildly distended with some suggestion of wall thickening and dependent calcified sludge. No discrete gallstones identified. No biliary ductal dilatation. Pancreas: Unremarkable. No pancreatic ductal dilatation or surrounding inflammatory changes. Spleen: Normal in size without significant abnormality. Adrenals/Urinary Tract: Adrenal glands are unremarkable. Small nonobstructive calculus of the superior pole of the right kidney. No hydronephrosis. Bladder is decompressed by a Foley catheter. Thickening of the urinary bladder wall. Stomach/Bowel: Stomach is within normal limits. Enteric feeding tube with tip in the duodenal bulb. Appendix is not clearly visualized. No evidence of bowel wall thickening, distention, or inflammatory changes. Rectal tube. Vascular/Lymphatic: Aortic atherosclerosis. Redemonstrated calcified aneurysm of the infrarenal abdominal aorta measuring 4.5 x 4.3 cm (series 3, image 80). No enlarged abdominal or pelvic lymph nodes. Reproductive: No mass or other abnormality. Other: No abdominal wall hernia or abnormality. Anasarca. Trace ascites throughout the abdomen and pelvis. Musculoskeletal: No acute or significant osseous findings. There is a small hematoma within the left psoas muscle body, measuring approximately 5.5 x 3.6 x 3.1 cm (series 3, image 88, series 6, image 122). IMPRESSION: 1. Status post median sternotomy and CABG with postoperative retrosternal stranding and fluid. There is no discrete fluid collection or other evidence of postoperative complication identified. 2. The gallbladder is mildly distended with some suggestion of wall thickening and dependent calcified sludge. No discrete gallstones identified. If there is clinical concern for acute cholecystitis, right upper quadrant ultrasound or HIDA may be useful to further evaluate. 3. There is a small hematoma within the left psoas muscle body, measuring approximately 5.5 x 3.6 x 3.1 cm. 4. Small,  right greater than left bilateral pleural effusions and associated atelectasis or consolidation. 5. Trace ascites and anasarca. 6. Extensive support apparatus as detailed above including tracheostomy and Impella device. 7. Redemonstrated calcified aneurysm of the infrarenal abdominal aorta measuring 4.5 x 4.3 cm. Recommend follow-up CT/MR every 6 months and vascular consultation when clinically appropriate. This recommendation follows ACR consensus guidelines: White Paper of the ACR Incidental Findings Committee II on Vascular Findings. J Am Coll Radiol 2013; 10:789-794. Aortic Atherosclerosis (ICD10-I70.0). Electronically Signed   By: Eddie Candle M.D.   On: 08/15/2020 11:39   US Abdomen Limited RUQ (LIVER/GB)  Result Date: 08/16/2020 CLINICAL DATA:  Fever EXAM: ULTRASOUND ABDOMEN LIMITED RIGHT UPPER QUADRANT COMPARISON:  CT Aug 15, 2020 FINDINGS: Gallbladder: Sludge in a dilated gallbladder with wall thickening measuring 3.5 mm and trace pericholecystic fluid. No cholelithiasis visualized. Common bile duct: Diameter: 3 mm Liver: No focal lesion identified. Within normal limits in parenchymal echogenicity. Portal vein is patent on color Doppler imaging with normal direction of blood flow towards the liver. Other: None. IMPRESSION: 1. Sludge in a dilated gallbladder with wall thickening and trace pericholecystic fluid. No cholelithiasis visualized. Findings are suspicious for acute acalculous cholecystitis. Electronically Signed   By: Dahlia Bailiff MD   On: 08/16/2020 03:35    PHYSICAL EXAM  Temp:  [99.14 F (37.3 C)-101.66 F (38.7 C)] 100.04 F (37.8 C) (05/25 1200) Pulse Rate:  [30-116] 78 (05/25 1200) Resp:  [6-32] 10 (05/25 1200) BP: (85-174)/(47-96) 90/61 (05/25 1200) SpO2:  [98 %-100 %] 100 % (05/25 1200) FiO2 (%):  [40 %] 40 % (05/25 1200) Weight:  [103.6 kg] 103.6 kg (05/25 0326)  General -  middle-aged Caucasian male on trach collar.  Ophthalmologic - fundi not visualized due to  noncooperation.  Cardiovascular - Regular rhythm and rate.  No murmur Neuro -right eye stable.  Left eyes open..  . Has some roving side-to-side eye movements in the left thigh but will not follow commands.  Right pupil not examined.  Left pupil 3 mm reactive.  Left corneal is present.  Does not blink to threat on the left.  He is not following commands, nonverbal. eyes left gaze preference position,  . Right mild facial droop, right eye difficulty closure, enlarged palpebral fissure. Tongue protrusion not cooperative.  He has some spontaneous left upper extremity movements from time to time against gravity.  Occasionally moves left lower extremity to noxious stimuli.  Minimal movement in the right upper and lower extremity to noxious stimuli.  Voluntary movements to command.  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  ASSESSMENT/PLAN 70 year old male with history of diabetes, hyperlipidemia, hypertension, diabetes, PVD, CAD/MI, CHF, OSA and stroke admitted on 07/24/2020 for severe cardiomyopathy with EF less than 20% and severe CAD status post CABG, put on LVAD.  Postop with multiple complications including respiratory failure, renal failure, heart failure and encephalopathy.  Had trach and PEG on 5/10.  CT head on 5/6 and 5/10 showed multifocal ischemic infarcts.  .  EEG showed severe encephalopathy, no seizure. Repeat CT on 5/17 again showed stable known infarcts, no new intracranial pathology. He had fever and subsequently found to have acute cholecystitis s/p surgery.      Patient clearly will have significant disability from his bilateral brainstem and multiple supratentorial infarcts may improve with prolonged supportive care given his multiple medical comorbidities severe cardiomyopathy, congestive heart failure, renal failure cholecystitis and significant encephalopathy which in the future is likely to  be complicated with lots of roller coaster improvement and decline episodes.   His quality of life and best is going to be very poor requiring 24-hour care in a nursing home setting.  Patient's wife clearly stated he would not want to live like that and had such a poor quality of life.  Family has decided on palliative care   decision about his goals of care.  Stroke team will sign off.  Kindly call for questions this patient is critically ill and at significant risk of neurological worsening, death and care requires constant monitoring of vital signs, hemodynamics,respiratory and cardiac monitoring, extensive review of multiple databases, frequent neurological assessment, discussion with family, other specialists and medical decision making of high complexity.I have made any additions or clarifications directly to the above note.This critical care time does not reflect procedure time, or teaching time or supervisory time of PA/NP/Med Resident etc but could involve care discussion time.  I spent 30 minutes of neurocritical care time  in the care of  this patient.     Antony Contras, MD Stroke Neurology 08/23/2020 12:44 PM

## 2020-08-23 NOTE — Progress Notes (Signed)
ANTICOAGULATION CONSULT NOTE  Pharmacy Consult for Heparin Indication: stroke  No Known Allergies  Patient Measurements: Height: '6\' 1"'$  (185.4 cm) Weight: 103.6 kg (228 lb 6.3 oz) IBW/kg (Calculated) : 79.9 Heparin Dosing Weight: 102.8 kg  Vital Signs: Temp: 99.86 F (37.7 C) (05/25 0500) BP: 137/63 (05/25 0500) Pulse Rate: 87 (05/25 0500)  Labs: Recent Labs    08/21/20 0025 08/22/20 0402 08/22/20 0516 08/23/20 0511  HGB 9.0* 7.8*  --  7.3*  HCT 29.3* 26.3*  --  25.6*  PLT 408* 351  --  346  HEPARINUNFRC 0.38 >1.10* 0.55 0.59  CREATININE 2.45* 2.19*  --  2.24*    Estimated Creatinine Clearance: 39.9 mL/min (A) (by C-G formula based on SCr of 2.24 mg/dL (H)).   Assessment: 70 yo M presents with NSTEMI and multivessel CAD, now s/p CABG with Impella support. Pharmacy asked to manage systemic heparin. Heparin was held following IR guided drain placement and removal of Impella 5.5. Anticoagulation to be continued for stroke.   Heparin level supratherapeutic (0.59) on gtt at 2150 units/hr  CBC Hgb 7.3, Plt 346  Goal of Therapy:  Heparin level 0.3-0.5 units/ml Monitor platelets by anticoagulation protocol: Yes   Plan:  Decrease heparin infusion to 1850 units / hr  Transitioning to comfort oriented care at [place], PharmD PGY1 Pharmacy Resident 08/23/2020 6:36 AM

## 2020-08-23 NOTE — Progress Notes (Signed)
SLP Cancellation Note  Patient Details Name: Danny Carpenter MRN: [id number] DOB: 09-20-1950   Cancelled treatment:       Reason Eval/Treat Not Completed: Other (comment) Pt on vent this am and per review, family is planning to transition to comfort care today. Will s/o for now but please reorder if we can be of assistance.     Osie Bond., M.A. [facility] Pager [phone] Office [phone]  08/23/2020, 8:36 AM

## 2020-08-23 NOTE — Progress Notes (Signed)
Referring Physician(s): Dr. Carlis Abbott, L.   Supervising Physician: Corrie Mckusick  Patient Status:  [facility] - In-pt  Chief Complaint:  S/p perc cholecystostomy tube placement on 5/18 with Dr. Serafina Royals.   Subjective:  Patient laying bed, appears diaphoretic and critically ill, but not in acute distress.  ETT in place, RN states that patient is going for comfort care today.   Allergies: Patient has no known allergies.  Medications: Prior to Admission medications   Medication Sig Start Date End Date Taking? Authorizing Provider  amLODipine (NORVASC) 10 MG tablet Take 1 tablet (10 mg total) by mouth daily. 01/21/19  Yes Masoudi, Elhamalsadat, MD  aspirin EC 81 MG tablet Take 81 mg by mouth daily. Swallow whole.   Yes [provider]  clopidogrel (PLAVIX) 75 MG tablet Take 75 mg by mouth daily.   Yes [provider]  DULoxetine (CYMBALTA) 30 MG capsule Take 30 mg by mouth daily.   Yes [provider]  gabapentin (NEURONTIN) 600 MG tablet Take 600 mg by mouth 3 (three) times daily.   Yes [provider]  Insulin Degludec (TRESIBA) 100 UNIT/ML SOLN Inject 62 Units into the skin in the morning. Every morning to control blood sugar   Yes [provider]  levocetirizine (XYZAL) 5 MG tablet Take 5 mg by mouth at bedtime.   Yes [provider]  lisinopril (ZESTRIL) 40 MG tablet Take 40 mg by mouth daily.   Yes [provider]  metFORMIN (GLUCOPHAGE) 1000 MG tablet Take 1,000 mg by mouth 2 (two) times daily with a meal.   Yes [provider]  nitroGLYCERIN (NITROSTAT) 0.4 MG SL tablet Place 0.4 mg under the tongue every 5 (five) minutes as needed for chest pain.   Yes [provider]  pantoprazole (PROTONIX) 40 MG tablet Take 40 mg by mouth daily before breakfast.   Yes [provider]  rosuvastatin (CRESTOR) 20 MG tablet Take 20 mg by mouth daily.   Yes [provider]  Vitamin D, Ergocalciferol,  (DRISDOL) 1.25 MG (50000 UNIT) CAPS capsule Take 50,000 Units by mouth 2 (two) times a week.   Yes [provider]     Vital Signs: BP 135/61   Pulse 83   Temp 99.5 F (37.5 C)   Resp 20   Ht '6\' 1"'$  (1.854 m)   Wt 228 lb 6.3 oz (103.6 kg)   SpO2 100%   BMI 30.13 kg/m   Physical Exam Vitals reviewed.  Constitutional:      General: He is not in acute distress.    Appearance: He is obese. He is ill-appearing and diaphoretic.  HENT:     Head: Normocephalic and atraumatic.  Cardiovascular:     Rate and Rhythm: Normal rate.  Pulmonary:     Comments: Remains intubated.  Abdominal:     Comments: Positive RUQ drain to foley bag. Site is unremarkable with no erythema, edema, tenderness, bleeding or drainage. Suture and stat lock in place. Dressing is clean, dry, and intact. 100 ml of bloody fluid noted in the foley bag. Drain aspirates and flushes well.   Neurological:     Mental Status: He is disoriented.     Imaging: MR BRAIN WO CONTRAST  Result Date: 08/22/2020 CLINICAL DATA:  Initial evaluation for acute stroke. EXAM: MRI HEAD WITHOUT CONTRAST TECHNIQUE: Multiplanar, multiecho pulse sequences of the brain and surrounding structures were obtained without intravenous contrast. COMPARISON:  Prior CT from 08/15/2020. FINDINGS: Brain: Examination somewhat degraded by motion artifact.  Generalized age-related cerebral atrophy. Patchy multifocal areas of diffusion abnormality seen involving the subcortical aspect of both cerebral hemispheres, right greater than left. For reference purposes, the largest focus is seen at the posterior right frontal centrum semi ovale and measures 9 mm (series 5, image 86). Patchy involvement of the corpus callosum including the splenium. Prominent involvement of the right occipital lobe (series 5, image 77). Additional patchy foci of diffusion abnormality seen involving the pons, right middle cerebellar peduncle, and cerebellum. Findings consistent  with acute subacute ischemic infarcts. No associated hemorrhage or significant mass effect. Involvement at the right cerebellar peduncle is more subacute in appearance, and corresponds with hyperdensity seen on prior CT. Gray-white matter differentiation otherwise maintained. Scattered foci of chronic hemosiderin staining noted involving the left cerebral hemisphere, suggesting prior subarachnoid hemorrhage. No acute intracranial hemorrhage. No mass lesion, midline shift or mass effect. No hydrocephalus or extra-axial fluid collection. Pituitary gland and suprasellar region within normal limits. Midline structures intact. Vascular: Major intracranial vascular flow voids are maintained. Skull and upper cervical spine: Craniocervical junction within normal limits. Bone marrow signal intensity normal. No scalp soft tissue abnormality. Sinuses/Orbits: Patient status post bilateral ocular lens replacement. Globes and orbital soft tissues demonstrate no acute finding. Scattered mucosal thickening noted throughout the paranasal sinuses. Fluid seen layering within the nasopharynx. Bilateral mastoid effusions noted. Other: None. IMPRESSION: 1. Patchy multifocal acute to subacute ischemic infarcts involving the bilateral cerebral hemispheres, right greater than left, as well as the pons, right middle cerebellar peduncle, and cerebellum. No associated hemorrhage or significant mass effect. 2. Underlying age-related cerebral atrophy. Electronically Signed   By: Jeannine Boga M.D.   On: 08/22/2020 04:07   DG CHEST PORT 1 VIEW  Result Date: 08/21/2020 CLINICAL DATA:  Ventilator dependence. EXAM: PORTABLE CHEST 1 VIEW COMPARISON:  08/16/2020 FINDINGS: Tracheostomy tube appears to be appropriately positioned. Impella left ventricular assist device has been removed. Right jugular central line is in the upper SVC region and stable. Left arm PICC line tip in the SVC and stable. Left subclavian central line has been removed.  Heart remains enlarged. There is a feeding tube that extends into the abdomen but the tip is beyond the image. Slightly prominent interstitial lung densities bilaterally. Old left rib fractures. Again noted are multiple metallic densities overlying the left lower chest. IMPRESSION: 1. Slightly increased interstitial lung densities. Findings could represent mild edema. 2. Support apparatuses as described. Electronically Signed   By: Markus Daft M.D.   On: 08/21/2020 08:04    Labs:  CBC: Recent Labs    08/20/20 0425 08/21/20 0025 08/22/20 0402 08/23/20 0511  WBC 8.0 8.5 8.1 7.8  HGB 8.0* 9.0* 7.8* 7.3*  HCT 26.4* 29.3* 26.3* 25.6*  PLT 370 408* 351 346    COAGS: Recent Labs    07/15/2020 1121 07/18/2020 1617 07/03/2020 1933 08/16/20 1327  INR 1.0 1.4* 1.5* 1.2  APTT  --  99* 41*  --     BMP: Recent Labs    08/20/20 1545 08/21/20 0025 08/22/20 0402 08/23/20 0511  NA 149* 149* 153* 155*  K 4.1 4.1 3.6 4.3  CL 116* 116* 115* 120*  CO2 '23 24 23 25  '$ GLUCOSE 266* 267* 220* 170*  BUN 78* 74* 65* 74*  CALCIUM 8.3* 8.3* 7.8* 8.3*  CREATININE 2.61* 2.45* 2.19* 2.24*  GFRNONAA 26* 28* 32* 31*    LIVER FUNCTION TESTS: Recent Labs    08/16/20 0429 08/25/2020 0528 08/19/20 0450 08/20/20 0425  BILITOT 1.8* 1.2 1.0  1.3*  AST 303* 45* 33 61*  ALT 105* 48* 31 44  ALKPHOS 517* 418* 305* 291*  PROT 5.8* 6.2* 5.9* 6.0*  ALBUMIN 1.8* 1.9* 1.7* 1.7*    Assessment and Plan:  70 year old male admitted with sepsis and acute acalculous cholecystitis, s/p perc choley tube placement with Dr. Serafina Royals on 5/18.   Drain stable, flush/aspirates well.  Culture showed no growth  OP 100 cc  WBC normal, 7.8. Hgb dropped, 7.3  VSS, afebrile RN states that patient is going for comfort care today.   Continue with flushing TID, output recording q shift and dressing changes as needed. Would consider additional imaging when output is less than 10 ml for 24 hours not including flush material.     Further treatment plan per TRH/PCCM/ Cardiology/ ID/ CT surgery Appreciate and agree with the plan.  IR to follow.    Electronically Signed: Tera Mater, PA-C 08/23/2020, 9:59 AM   I spent a total of 15 Minutes at the the patient's bedside AND on the patient's hospital floor or unit, greater than 50% of which was counseling/coordinating care for perc choley tube

## 2020-08-23 NOTE — Progress Notes (Signed)
Pt taken off of the ventilator care to room air per palliative care withdrawal.

## 2020-08-23 NOTE — Progress Notes (Signed)
Progress Note from the Palliative Medicine Team at [facility]   Patient Name: Danny Carpenter        Date: 08/23/2020 DOB: 08/05/1950  Age: 69 y.o. MRN#: [id number] Attending Physician: Atkins, Broadus Z, MD Primary Care Physician: Burgart, Jennifer, MD Admit Date: 07/10/2020   Medical records reviewed   69 y.o. male  admitted on 07/09/2020 with  past medical history significant for hypertension, hyperlipidemia, type 2 diabetes, CVA, AAA, left foot amputation s/p fem pop bypass and heart failure who presented to the emergency department 4/21 after undergoing outpatient stress test that revealed EF of 20% and acute ischemia.  Since admission patient was evaluated by cardiology and cardiothoracic surgery and patient underwent TEE and left heart cath. LHC/RHC revealed severe three-vessel coronary artery disease with moderately elevated left heart and pulmonary artery pressures. At that time patient was placed on goal-directed medical therapy for acute systolic congestive heart failure and CTS surgery was consulted for CABG potential.  Patient underwent CABG x5 with Dr. Atkins 4/26, post CABG CODE BLUE called due to low flow on Impella, CTS notified. Patient underwent bedside exploratory postoperative open heart where Dr. Adkins reopened the chest incision and hematoma was removed, patient's hemodynamics improved upon opening sternotomy.  PCCM consulted 4/28 due to difficulty weaning ventilator.  He was trached  08-07-20  CRRT initiated on 08/01/2020, currently on hold.       Head CT on 08/04/2020 significant for multiple areas of posterior circulation infarct.  08-12-20 30 minutes in Torsades until paced out, code status reversed  08-14-20 persistent fevers.  ID consulted, pan CT.    08-17-20-creatinine improved, still making urine but slowly   HD on hold for now.  08-21-20-removal of Impella      Brain MRI  08-22-20 family was able to talk with neurology today   Decision made to shift to  full comfort tomorrow when family has chance to gather.    I met with large family and again had conversation regarding current medical situation.    Created space and opportunity for family to  explore the thoughts and feelings regarding patient's current medical situation. All agree that comfort is the priority of care allowing for a natural death.   All verbalize their love and appreciation for Danny Carpenter   Plan of care -DNR/ DNI- liberate from the ventilator  -no artifical feeding or hydration now or in the future - no diagnostics, no labs, no IV antibiotics --utilization of as needed meds to enhance comfort        - Fentanyl gtt with prn boluses         - Ativan 1 mg IV scheduled every 6 hrs          - Robinul 0.4 mg three times a day  - expect a hospital death   Education offered on the natural trajectory and expectations at end-of-life. Prepared family that anything can happen at any time, but prognosis may be days   Discussed with  bedside nursing.  PMT will continue to support holistically.   Total time spent on the unit was  45 minutes  Greater than 50% of the time was spent in counseling and coordination of care    NP  Palliative Medicine Team Team Phone # [phone] Pager [phone]   

## 2020-08-23 NOTE — Progress Notes (Signed)
NAMETraeson Carpenter, MRN:  [id number], DOB:  April 19, 1950, LOS: 71 ADMISSION DATE:  07/24/2020, CONSULTATION DATE: 07/28/2018 REFERRING MD: Dr. Orvan Carpenter, CHIEF COMPLAINT: Ventilator dependent  History of Present Illness:  Danny Carpenter is a 70 year old male with past medical history significant for hypertension, hyperlipidemia, type 2 diabetes, CVA, AAA, left foot amputation s/p fem pop bypass and heart failure who presented to the emergency department 4/21 after undergoing outpatient stress test that revealed EF of 20% and acute ischemia.  Since admission patient has been evaluated by cardiology and cardiothoracic surgery and patient underwent TEE and left heart cath.  LHC/RHC revealed severe three-vessel coronary artery disease with moderately elevated left heart and pulmonary artery pressures.  At that time patient was placed on goal-directed medical therapy for acute systolic congestive heart failure and CTS surgery was consulted for CABG potential.  Patient underwent CABG x5 with Dr. Orvan Carpenter 4/26, post CABG CODE BLUE called due to low flow on Impella, CTS notified.  Patient underwent bedside exploratory postoperative open heart where Dr. Julien Carpenter reopened the chest incision and hematoma was removed, patient's hemodynamics improved upon opening sternotomy.  PCCM consulted morning of 4/28 due to difficulty weaning ventilator  Pertinent  Medical History  Hypertension, hyperlipidemia, type 2 diabetes, CVA, AAA, left foot amputation s/p fem pop bypass and heart failure   Significant Hospital Events: Including procedures, antibiotic start and stop dates in addition to other pertinent events   . 4/21 presented to the ED for evaluation of abnormal stress test . 4/22 left and right heart cath severe multivessel disease . 4/26 CABG x5 with hemodynamic instability post requiring brief CPR with eventual return back to the OR anastomosis bleeding was Carpenter . 4/28 remains ventilator dependent, slowly weaning  PEEP . 5/1 remains on vent, diuresing, no ready to come off  . 5/3 started on CRRT. . 5/5 requiring sedation for vent  . 5/6 CT scan shows multiple areas of posterior circulation infarction. . 5/8 tolerating CRRT with fluid removal.  Decreasing vasopressor requirements. . 5/12 TCT today . 5/13 VT event, self converted, febrile . 5/14 spent ~30 mins in Torsades until paced out, code status reversed by Dr. Julien Carpenter, patient spent 30 mins with no pulsatility and no flow except for the impella.  . 5/16 starting eraxis for persistent fevers . 5/17 remains febrile; planning to pan-CT . 5/18 perc bili drain placed for acalculous cholecystitis. Eraxis stopped by ID. Marland Kitchen 5/19 fentanyl turned off. . 5/20 Impella removed . 5/22 off epinephrine drip, on pressure support weans . 5/23, tracheostomy tube in place, Impella removed, off pressors.  Interim History / Subjective:   Trach in place. Unresponsive to stimuli. Danny Carpenter discussions underway with family   Objective   Blood pressure (!) 143/76, pulse 77, temperature 99.14 F (37.3 C), temperature source Bladder, resp. rate 18, height '6\' 1"'$  (1.854 m), weight 103.6 kg, SpO2 100 %. CVP:  [3 mmHg-26 mmHg] 5 mmHg  Vent Mode: PRVC FiO2 (%):  [40 %] 40 % Set Rate:  [20 bmp] 20 bmp Vt Set:  [620 mL] 620 mL PEEP:  [5 cmH20] 5 cmH20 Plateau Pressure:  [17 cmH20-23 cmH20] 17 cmH20   Intake/Output Summary (Last 24 hours) at 08/23/2020 0838 Last data filed at 08/23/2020 0800 Gross per 24 hour  Intake 1049.87 ml  Output 3295 ml  Net -2245.13 ml   Filed Weights   08/21/20 0600 08/22/20 0436 08/23/20 0326  Weight: 103.2 kg 102.6 kg 103.6 kg    Examination: Gen:   Obese male,  chronically ill appearing  HEENT: right eye tapped close  Lungs:   Diminished BL breath sounds CV:       RRR, s1 s2  Abd:   BS present  Ext: No edema  Skin:     No rash  Neuro:  Opens left eye to painful stimuli   Labs/imaging that I have personally reviewed    Sodium  155 Potassium 4.3 BUN 74 Serum creatinine 2.2 White blood cell count 7.8 Hemoglobin 7.3  Resolved Hospital Problem list     Assessment & Plan:   Acute hypoxic vent- dependent  respiratory failure  Tracheostomy dependent R pleural effusion, mild pulmonary edema  Plan: Routine trach care  TCT as able  Plans for transition to comfort care measures today per palliative care team and TCTS   Cardiogenic shock due to ischemic cardiomyopathy CAD s/p CABG x5 Recurrent episodes of VT Supportive care at this time   Acute Encephalopathy: multifactorial in setting of critical illness. Prolonged icu stay, posterior circulation CVA. Concern for anoxic brain injury during low flow state -Unable to get brain MRI due to pacing wires for further evaluation. Plan: Supportive care   Ongoing fevers- improving - HD catheter and Foley exchanged previously. Con't dialysis catheter as he is still at risk of requiring CRRT --con't biliary drain.  He was previously on Eraxis.  On Zosyn.  Follow cultures Plan: Continue zosyn until family has made final decisions  AKI Follow UOP and kidney function   Colonic ileus, resolved -Increasing TF towards goal, monitor for intolerance. Acute blood loss anemia due to operative blood loss and critical illness Thrombocytopenia -resolved Plan: Observe for any acute sign of bleeding   AAA Supportive care  Hyperglycemia- controlled.  History of poorly controlled diabetes PTA. Plan: SSI plus Levemir  PAD s/p partial L foot amputation  -monitor peripheral circulation-- has been stable this week Plan: Aspirin plus statin  DNR   Best practice   Diet:  Tube Feed  Pain/Anxiety/Delirium protocol (if indicated): Yes (RASS goal -1)  VAP protocol (if indicated): Yes DVT prophylaxis: Systemic AC - Heparin  GI prophylaxis: PPI   Glucose control:  SSI Yes - SSI + basal  Central venous access:  Yes, and it is still needed, Arterial line:  N/A  Foley:  Yes,  and it is still needed  Mobility:  bed rest  PT consulted: N/A Last date of multidisciplinary goals of care discussion: possible comfort care transitions today  Code Status:  DNR Disposition: ICU   Danny Nash, DO [facility] 08/23/2020 8:38 AM

## 2020-08-23 NOTE — Progress Notes (Signed)
Spoke with Leanora Ivanoff from [facility], given update about withdrawal. RN to call [employer] with cardiac time of death.

## 2020-08-23 NOTE — Progress Notes (Addendum)
Patient ID: Danny Carpenter, male   DOB: November 16, 1950, 70 y.o.   MRN: [id number]     Advanced Heart Failure Rounding Note  PCP-Cardiologist: None   Subjective:    - 4/26 S/P CABG - 5/4 Swan removed with ongoing fevers, CVVH started - CT head on 5/6 with multiple posterior circulation infarcts.  - 5/10 tracheostomy. CT head similar to prior with multiple small infarcts.  - 5/14 Torsades -> paced out by Dr. Orvan Seen. Impella turned back up from P3 -> P7  after code. Amio gtt switched to lidocaine.   - 5/16 Eraxis added for persistent fevers. Cultures resent>>pending  - 5/17 CT chest/abdomen/pelvis and abdominal US suggestive of acalculous cholecystitis - 5/18 cholecystostomy tube - 5/20 Return to the OR today for Impella removal. TEE with EF 30%-->started on epi 2 mcg with improved EF to 50%.   Continues on mexiletine, remains in AF but no VT.   CO-OX 65 % off milrinone.   Tm 101 , remains on Zosyn.  Has biliary drain.    Hgb 7.3 .    Remains on vent--trach. No change.    Objective:   Weight Range: 103.6 kg Body mass index is 30.13 kg/m.   Vital Signs:   Temp:  [99.14 F (37.3 C)-101.66 F (38.7 C)] 99.14 F (37.3 C) (05/25 0800) Pulse Rate:  [30-116] 77 (05/25 0800) Resp:  [6-35] 18 (05/25 0800) BP: (91-174)/(52-96) 143/76 (05/25 0800) SpO2:  [98 %-100 %] 100 % (05/25 0800) FiO2 (%):  [40 %] 40 % (05/25 0800) Weight:  [103.6 kg] 103.6 kg (05/25 0326) Last BM Date: 08/23/20  Weight change: Filed Weights   08/21/20 0600 08/22/20 0436 08/23/20 0326  Weight: 103.2 kg 102.6 kg 103.6 kg    Intake/Output:   Intake/Output Summary (Last 24 hours) at 08/23/2020 0917 Last data filed at 08/23/2020 0800 Gross per 24 hour  Intake 1049.87 ml  Output 2995 ml  Net -1945.13 ml      Physical Exam  General:  On Vent  HEENT: R eye patch.  Neck: Trach,s upple. no JVD. Carotids 2+ bilat; no bruits. No lymphadenopathy or thryomegaly appreciated. Cor: PMI nondisplaced. Irregular  rate & rhythm. No rubs, gallops or murmurs. Lungs: clear Abdomen: soft, nontender, nondistended. No hepatosplenomegaly. No bruits or masses. Good bowel sounds. Extremities: no cyanosis, clubbing, rash, edema Neuro: No purposeful movement   Telemetry   A Fib 80-100s   Labs    CBC Recent Labs    08/22/20 0402 08/23/20 0511  WBC 8.1 7.8  HGB 7.8* 7.3*  HCT 26.3* 25.6*  MCV 100.0 102.4*  PLT 351 616   Basic Metabolic Panel Recent Labs    08/22/20 0402 08/23/20 0511  NA 153* 155*  K 3.6 4.3  CL 115* 120*  CO2 23 25  GLUCOSE 220* 170*  BUN 65* 74*  CREATININE 2.19* 2.24*  CALCIUM 7.8* 8.3*   Liver Function Tests No results for input(s): AST, ALT, ALKPHOS, BILITOT, PROT, ALBUMIN in the last 72 hours. No results for input(s): LIPASE, AMYLASE in the last 72 hours. Cardiac Enzymes No results for input(s): CKTOTAL, CKMB, CKMBINDEX, TROPONINI in the last 72 hours.  BNP: BNP (last 3 results) Recent Labs    06/30/2020 1211  BNP 465.5*    ProBNP (last 3 results) No results for input(s): PROBNP in the last 8760 hours.   D-Dimer No results for input(s): DDIMER in the last 72 hours. Hemoglobin A1C No results for input(s): HGBA1C in the last 72 hours. Fasting Lipid Panel No  results for input(s): CHOL, HDL, LDLCALC, TRIG, CHOLHDL, LDLDIRECT in the last 72 hours. Thyroid Function Tests No results for input(s): TSH, T4TOTAL, T3FREE, THYROIDAB in the last 72 hours.  Invalid input(s): FREET3  Other results:   Imaging    No results found.   Medications:     Scheduled Medications: . aspirin EC  325 mg Oral Daily   Or  . aspirin  324 mg Per Tube Daily  . chlorhexidine gluconate (MEDLINE KIT)  15 mL Mouth Rinse BID  . Chlorhexidine Gluconate Cloth  6 each Topical Daily  . erythromycin   Right Eye Q2H  . free water  100 mL Per Tube Q4H  . Gerhardt's butt cream   Topical QID  . insulin aspart  0-15 Units Subcutaneous Q4H  . insulin detemir  23 Units  Subcutaneous BID  . isosorbide-hydrALAZINE  2 tablet Per Tube TID  . mouth rinse  15 mL Mouth Rinse 10 times per day  . mexiletine  200 mg Per Tube Q12H  . pantoprazole sodium  40 mg Per Tube Daily  . sodium chloride flush  10-40 mL Intracatheter Q12H  . sodium chloride flush  5 mL Intracatheter Q8H    Infusions: . sodium chloride Stopped (08/28/2020 0734)  . sodium chloride    . sodium chloride    . sodium chloride Stopped (08/22/20 0457)  . dextrose Stopped (08/17/20 1116)  . feeding supplement (VITAL 1.5 CAL) 25 mL/hr at 08/22/20 1440  . heparin 1,850 Units/hr (08/23/20 0800)  . piperacillin-tazobactam (ZOSYN)  IV 3.375 g (08/23/20 0818)    PRN Medications: sodium chloride, Place/Maintain arterial line **AND** sodium chloride, acetaminophen (TYLENOL) oral liquid 160 mg/5 mL, fentaNYL (SUBLIMAZE) injection, levalbuterol, polyvinyl alcohol, sodium chloride flush  Assessment/Plan   1. Cardiogenic shock: Ischemic cardiomyopathy, post-CABG on 4/26.  He has Impella 5.5 in place, down to P4 with no alarms and stable LDH.  Limited echo 4/29 with EF< 20%, the RV appears normal in size with severe systolic dysfunction.  Echo with some improvement, EF in 30-35% range on 5/9 echo. Suspect component of septic/distributive shock. Impella out 5/20. EF 30% intraop TEE 5/20.  Co-ox 65%, off milrinone.  - BP stable. Continue  Bidil 2 tab tid.  2. CAD: s/p CABG x 5 with LIMA-LAD, seq SVG-D1/ramus, seq SVG-PDA/PLV.   - ASA  - Crestor 3. Anemia: Post-op bleeding, back to OR with multiple products given on 4/26 post-CABG.  Hgb 7.3 no overt bleeding. Got 1 unit PRBCs on 5/21.  4. Thrombocytopenia: Resolved. suspect low post-op/post-surgical bleeding and multiple blood products as well as sepsis.  5. PAD: Extensive history.  6. AAA: Monitoring as outpatient. 4.5 cm on CT.  7. Type 2 DM: Insulin.  Hgb A1C 11.6, poor control. On SSI  8. Atrial fibrillation: Rate controlled.  Off amio with torsades and  possible QT prolongation.  - Heparin gtt.  9. Neuro: intubated and sedated, not following commands with sedation wean.  Head CT 5/6 with multiple posterior circulation infarcts. Head CT 5/10 no change. Eyes open and withdraws from pain. NH3 was not elevated.  - Neuro has seen with evidence of multiple infarcts.  - Palliative Care following.  10. ID:  PCT 7.5 on 4/29. Cultures so far negative.  Now with cholecystostomy drain for acalculous cholecystitis. Off Eraxis now and on Zosyn for biliary coverage. ID following.  Persistent fevers, Tm 101.  Lines all have been changed out.  - Continue Zosyn.  - ?Noninfectious fever  - ID following.  11. Acute hypoxemic respiratory failure: S/p tracheostomy on 5/10.  - trach management per PCCM  12. AKI:  CVVH started with marked volume overload and worsening renal function.  Now off CVVH Creatinine 2.24.   13. FEN: Tube feeds, will need PEG.  14. Torsades: Paced out by Dr Orvan Seen on 5/14. Amiodarone stopped.  - continue Mexiletine  15. GOC: was limited code and now changed back to Full Code Family meeting ongoing. Now DNR.  16. Acute acalculous cholecystitis: S/p biliary drain placement.   - Zosyn for biliary coverage.  17. Red eye: Right eye red with copious yellow-green drainage.  Discussed with ophthalmology, recommendations given.  18. Hypernatremia: Na 155 .  - On free water.       Amy Clegg NP-C  08/23/2020 9:17 AM  Patient seen with NP, agree with the above note .  Clinically stable, now afebrile, off pressors and inotropes.  No purposeful movement.   General: intubated/eyes open Neck: No JVD, no thyromegaly or thyroid nodule.  Lungs: Clear to auscultation bilaterally with normal respiratory effort. CV: Nondisplaced PMI.  Heart regular S1/S2, no S3/S4, no murmur.  No peripheral edema.  No carotid bruit.  Normal pedal pulses.  Abdomen: Soft, nontender, no hepatosplenomegaly, no distention.  Skin: Intact without lesions or rashes.   Neurologic: Does not follow commands. Extremities: No clubbing or cyanosis.  HEENT: Normal.   MR brain yesterday with acute/subacute infarcts involving bilateral cerebrum, cerebellum, and pons.   Extensive discussion yesterday with family + palliative care + neurology.  Suspect prognosis for meaningful recovery is going to be poor.  Plan for transition to comfort care later today.    With transition to comfort care, cardiology will sign off but will be available for questions.   Loralie Champagne 08/23/2020 9:31 AM

## 2020-08-24 DIAGNOSIS — R0609 Other forms of dyspnea: Secondary | ICD-10-CM | POA: Diagnosis not present

## 2020-08-24 DIAGNOSIS — R52 Pain, unspecified: Secondary | ICD-10-CM

## 2020-08-24 DIAGNOSIS — Z515 Encounter for palliative care: Secondary | ICD-10-CM | POA: Diagnosis not present

## 2020-08-25 NOTE — Progress Notes (Addendum)
Patient expired at 09-10-20, 2355 with family members at bedside. On call Cardiothoracic Surgery MD, Dr. Cyndia Bent was made aware.  Wasted 9 mL of fentanyl 2,500 mcg (10 mcg/mL) in 250 mL NS with Guadalupe Dawn., RN in stericycle.  Family members are still inside the room grieving.  [address] of patient's death and HB staff, Colletta Maryland provided the referral no. [id number] from previous referral and potential donor evaluation is still pending per HB staff.

## 2020-08-30 NOTE — Progress Notes (Signed)
Nutrition Brief Note ? ?Chart reviewed. ?Pt now transitioning to comfort care.  ?No further nutrition interventions planned at this time.  ?Please re-consult as needed.  ? ? Kayliee Atienza MS, RDN, LDN, CNSC ?Registered Dietitian III ?Clinical Nutrition ?RD Pager and On-Call Pager Number Located in Amion  ? ? ?

## 2020-08-30 NOTE — Progress Notes (Signed)
RT note-assessed patient trach for patency, inner canula changed due to dried secretions obstruction. Family at the bedside.

## 2020-08-30 NOTE — Progress Notes (Signed)
    Progress Note from the Palliative Medicine Team at [facility]   Patient Name: Danny Carpenter        Date: 2020/09/09 DOB: Mar 29, 1951  Age: 70 y.o. MRN#: [id number] Attending Physician: Wonda Olds, MD Primary Care Physician: Serita Grammes, MD Admit Date: 07/27/2020   Medical records reviewed   70 y.o. male  admitted on 07/24/2020 with  past medical history significant for hypertension, hyperlipidemia, type 2 diabetes, CVA, AAA, left foot amputation s/p fem pop bypass and heart failure who presented to the emergency department 4/21 after undergoing outpatient stress test that revealed EF of 20% and acute ischemia.  Since admission patient was evaluated by cardiology and cardiothoracic surgery and patient underwent TEE and left heart cath. LHC/RHC revealed severe three-vessel coronary artery disease with moderately elevated left heart and pulmonary artery pressures. At that time patient was placed on goal-directed medical therapy for acute systolic congestive heart failure and CTS surgery was consulted for CABG potential.  Patient underwent CABG x5 with Dr. Orvan Seen 4/26, post CABG CODE BLUE called due to low flow on Impella, CTS notified. Patient underwent bedside exploratory postoperative open heart where Dr. Julien Girt reopened the chest incision and hematoma was removed, patient's hemodynamics improved upon opening sternotomy.  PCCM consulted 4/28 due to difficulty weaning ventilator.  He was trached  08-07-20  CRRT initiated on 08/01/2020, currently on hold.       Head CT on 08/04/2020 significant for multiple areas of posterior circulation infarct.  08-12-20 30 minutes in Torsades until paced out, code status reversed  08-14-20 persistent fevers.  ID consulted, pan CT.    08-17-20-creatinine improved, still making urine but slowly   HD on hold for now.  08-21-20-removal of Impella      Brain MRI  08-22-20 family spoke with neurology    Decision made to shift to full comfort  tomorrow when family has chance to gather.   Patient is unresponsive, appears comfortable.  Hands and feet cool.  Mottling at the knees  Prognosis is likely hours.    Plan of care  Focus of care is comfort and dignity  -DNR/ DNI -no artifical feeding or hydration now or in the future - no diagnostics, no labs, no IV antibiotics --utilization of as needed meds to enhance comfort        - Fentanyl gtt with prn boluses         - Ativan 1 mg IV scheduled every 6 hrs          - Robinul 0.4 mg three times a day  - expect a hospital death    Discussed with  bedside nursing.  PMT will continue to support holistically.   Total time spent on the unit was  25 minutes  Greater than 50% of the time was spent in counseling and coordination of care  Wadie Lessen NP  Palliative Medicine Team Team Phone # [phone] Pager [phone]

## 2020-08-30 NOTE — Progress Notes (Signed)
Patient has been transitioned to comfort care measures. Ophthalmology originally consulted for right eye redness due to lagophthalmos. Tuesday, instructions provided to nursing staff that no gauze pads were to be placed over the eye, appropriate lid taping and ointment application instructions were provided and demonstrated. Unfortunately, Gauze was noted over the right eye upon rounding this morning. Gauze was adherent to inferior conjunctiva and the lids were not taped.   On physical exam: Ointment was noted on both ocular surfaces, injection in OD was improved from last examination, though there is still epithelial irregularity without clear defect on bedside exam.    A/P: 1. Lagophtalmos (Eyes stay open): EYES SHOULD BE MAINTAINED IN A CLOSED POSITION USING TAPE. THERE IS TO BE NO GAUZE OF ANY KIND PLACED OVER THE EYES. It critically important that tape never come into contact with corneal surface. Aggressive ointment use every two hours is recommended.   Primary team please ensure that the above measures are adhered to. Given patients transfer to comfort care Ophthalmology will be signing off but is available by re-consultation if needed.

## 2020-08-30 DEATH — deceased

## 2020-09-29 NOTE — Discharge Summary (Signed)
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 Referring Physician/Provider: Dr Donnetta Simpers Date: 08/09/2020 Duration:  23.10 mins Patient history: 70 year old male with altered mental status. EEG to evaluate for seizures. Level of alertness:  lethargic AEDs during EEG study: Clonazepam Technical aspects: This EEG study was done with scalp electrodes positioned according to the 10-20 International system of electrode placement. Electrical activity was acquired at a sampling rate of '500Hz'$  and reviewed with a high frequency filter of '70Hz'$  and a low frequency filter of '1Hz'$ . EEG data were recorded continuously and digitally stored. Description: No clear posterior dominant rhythm was seen.  EEG showed continuous generalized 3 to 5 Hz theta-delta slowing.  Generalized periodic discharges with triphasic morphology at 1 Hz were also noted. Hyperventilation and photic stimulation were not performed.   ABNORMALITY - Continuous slow, generalized - Periodic discharges with triphasic morphology, generalized ( GPDs) IMPRESSION: This study showed generalized periodic discharges with triphasic morphology at 1 Hz which is on the ictal-interictal continuum.  However, the frequency and morphology is more commonly seen in toxic-metabolic encephalopathy. Additionally  there is evidence of suggestive of moderate diffuse encephalopathy, nonspecific etiology. No seizures or definite epileptiform discharges were seen throughout the recording. [name]   ECHO INTRAOPERATIVE TEE  Result Date: 08/17/2020  *INTRAOPERATIVE TRANSESOPHAGEAL REPORT *  Patient Name:   [facility] Date of Exam: 08/01/2020 Medical Rec #:  [id number]     Height:       73.0 in Accession #:    [id number]    Weight:       240.1 lb Date of Birth:  03/04/1951    BSA:          2.33 m Patient Age:    20 years      BP:           169/106 mmHg Patient Gender: M             HR:           92 bpm. Exam Location:  Inpatient Transesophogeal exam was perform intraoperatively during surgical procedure. Patient was closely monitored under general anesthesia during the entirety of examination. Indications:     Impella device removal Performing Phys: Annye Asa MD Diagnosing Phys: Annye Asa MD Complications: No known complications during this procedure. POST-OP IMPRESSIONS Limited exam: post-Impella removal  - Left Ventricle: The ptient tolerated Impella removal well, with Epionephrine and continued Dobutamine support. The LV now has normal systolic function, with overall EF 56%. The septum is only mildly hypokinetic, with overall excellent recovery of contractility. The cavity size was only mildly dilated. - Right Ventricle: The RV also has improvement in contractility. it has only mildly reduced function in the apex. It is no longer dilated. - Aortic Valve: There has been interval removal of the Impella device. No aortic stenosis present, with peak gradient 23 mmHg, mean gradient 8 mmHg. There is no regurgitation. - Mitral Valve: The mitral valve function appears unchanged from pre-bypass images. No stenosis present, with peak gradient 7 mmHg, mean gradient 3 mmHg. There is no mitral regurgitation. - Tricuspid Valve: The tricuspid valve function appears unchanged from pre-bypass images. There is no  regurgitation. PRE-OP FINDINGS  Left Ventricle: The left ventricle has severely reduced systolic function, with an ejection fraction of 20-30%, measured 25%. The cavity size was moderately dilated. Left ventricular diffuse hypokinesis. Severe hypokinesis of the entire septal wall. There is no left ventricular hypertrophy. Left ventricular diastolic function was not evaluated. The Impella assist device is in good position in the LV, through the aortic outflow tract. Right Ventricle: The right ventricle has  severely reduced systolic function. The cavity was dilated. There is no increase in right ventricular wall thickness. The apex is extremely hypokinetic. Left Atrium: Left atrial size was normal in size. No left atrial/left atrial appendage thrombus was detected. Left atrial appendage velocity is normal at greater than 40 cm/s. Right Atrium: Right atrial size was normal in size. Interatrial Septum: No atrial level shunt detected by color flow Doppler. There is no evidence of a patent foramen ovale. Pericardium: There is no evidence of pericardial effusion. Mitral Valve: The mitral valve is normal in structure. Mitral valve regurgitation is not visualized by color flow Doppler. There is no evidence of mitral valve vegetation. There is no evidence of mitral stenosis. Tricuspid Valve: The tricuspid valve was normal in structure. Tricuspid valve regurgitation is trivial by color flow Doppler. No evidence of tricuspid stenosis is present. Aortic Valve: The aortic valve is tricuspid. Aortic valve regurgitation is trivial by color flow Doppler, around the Impella assist device. The Impella is well positioned through the aortic valve. There is no apparent stenosis of the aortic valve. There is mild thickening present on the aortic valve right coronary, left coronary and non-coronary cusps with normal mobility. Pulmonic Valve: The pulmonic valve was normal in structure, with normal leaflet excursion. No evidence of pulmonic  stenosis. Pulmonic valve regurgitation is trivial by color flow Doppler. Aorta: There is evidence of scattered complex plaque in the ascending aorta, aortic arch and descending aorta; Grade III, measuring 3-59m in size. Pulmonary Artery: The pulmonary artery is of normal size. Venous: The inferior vena cava is normal in size with less than 50% respiratory variability, suggesting right atrial pressure of 8 mmHg. Shunts: There is no evidence of an atrial septal defect.  CAnnye AsaMD Electronically signed by CAnnye AsaMD Signature Date/Time: 08/25/2020/5:17:40 PM    Final    VAS [place]OWER EXTREMITY VENOUS (DVT)  Result Date: 08/16/2020  Lower Venous DVT Study [facility]  Date of Exam:   08/16/2020 Medical Rec #: [id number]     Accession #:    [id number] of Birth: [birth year]    Patient Gender: M Patient Age:   69Y [facility]:      VAS [place]OWER EXTREMITY VENOUS (DVT) Referring Phys: [id number]--------------------------------------------------------------------------------  Indications: Edema.  Comparison Study: 08/11/20 previous Performing Technologist: MAbram SanderRVS  Examination Guidelines: A complete evaluation includes B-mode imaging, spectral Doppler, color Doppler, and power Doppler as needed of all accessible portions of each vessel. Bilateral testing is considered an integral part of a complete examination. Limited examinations for reoccurring indications may be performed as noted. The reflux portion of the exam is performed with the patient in reverse Trendelenburg.  +---------+---------------+---------+-----------+----------+-------------------+ RIGHT    CompressibilityPhasicitySpontaneityPropertiesThrombus Aging      +---------+---------------+---------+-----------+----------+-------------------+ CFV      Full           Yes      Yes                                       +---------+---------------+---------+-----------+----------+-------------------+ SFJ      Full                                                             +---------+---------------+---------+-----------+----------+-------------------+  FV Prox  Full                                                             +---------+---------------+---------+-----------+----------+-------------------+ FV Mid   Full                                                             +---------+---------------+---------+-----------+----------+-------------------+ FV DistalFull                                                             +---------+---------------+---------+-----------+----------+-------------------+ PFV      Full                                                             +---------+---------------+---------+-----------+----------+-------------------+ POP      Full           Yes      Yes                                      +---------+---------------+---------+-----------+----------+-------------------+ PTV      Full                                                             +---------+---------------+---------+-----------+----------+-------------------+ PERO                                                  Not well visualized +---------+---------------+---------+-----------+----------+-------------------+   +---------+---------------+---------+-----------+----------+-------------------+ LEFT     CompressibilityPhasicitySpontaneityPropertiesThrombus Aging      +---------+---------------+---------+-----------+----------+-------------------+ CFV      Full           Yes      Yes                                      +---------+---------------+---------+-----------+----------+-------------------+ SFJ      Full                                                             +---------+---------------+---------+-----------+----------+-------------------+ FV  Prox  Full                                                             +---------+---------------+---------+-----------+----------+-------------------+  FV Mid   Full                                                             +---------+---------------+---------+-----------+----------+-------------------+ FV DistalFull                                                             +---------+---------------+---------+-----------+----------+-------------------+ PFV      Full                                                             +---------+---------------+---------+-----------+----------+-------------------+ POP      Full           Yes      Yes                                      +---------+---------------+---------+-----------+----------+-------------------+ PTV      Full                                                             +---------+---------------+---------+-----------+----------+-------------------+ PERO                                                  Not well visualized +---------+---------------+---------+-----------+----------+-------------------+     Summary: BILATERAL: - No evidence of deep vein thrombosis seen in the lower extremities, bilaterally. -No evidence of popliteal cyst, bilaterally.   *See table(s) above for measurements and observations. Electronically signed by Curt Jews MD on 08/16/2020 at 8:37:02 PM.    Final    VAS [place] LOWER EXTREMITY VENOUS (DVT)  Result Date: 08/11/2020  Lower Venous DVT Study Patient Name:  [facility]  Date of Exam:   08/11/2020 Medical Rec #: [id number]      Accession #:    [id number] Date of Birth: 11-04-1950     Patient Gender: M Patient Age:   69Y Exam Location:  [facility] Procedure:      VAS [place] LOWER EXTREMITY VENOUS (DVT) Referring Phys: [id number] [place] --------------------------------------------------------------------------------  Indications: Edema. Other Indications: Status post  CABG 4/26 with right leg vein harvested. Limitations: Poor ultrasound/tissue interface. Comparison Study: No prior Performing Technologist: Oda Cogan RDMS, RVT  Examination Guidelines: A complete evaluation includes B-mode imaging, spectral Doppler, color Doppler, and power Doppler as needed of all accessible portions of each vessel. Bilateral testing is considered an integral part of a complete examination. Limited examinations for reoccurring indications may be performed as noted. The reflux portion of the exam is performed with the  patient in reverse Trendelenburg.  +---------+---------------+---------+-----------+----------+--------------+ RIGHT    CompressibilityPhasicitySpontaneityPropertiesThrombus Aging +---------+---------------+---------+-----------+----------+--------------+ CFV      Full           Yes      Yes                                 +---------+---------------+---------+-----------+----------+--------------+ SFJ      Full                                                        +---------+---------------+---------+-----------+----------+--------------+ FV Prox  Full                                                        +---------+---------------+---------+-----------+----------+--------------+ FV Mid   Full                                                        +---------+---------------+---------+-----------+----------+--------------+ FV DistalFull                                                        +---------+---------------+---------+-----------+----------+--------------+ PFV      Full                                                        +---------+---------------+---------+-----------+----------+--------------+ POP      Full           Yes      Yes                                 +---------+---------------+---------+-----------+----------+--------------+ PTV      Full                                                         +---------+---------------+---------+-----------+----------+--------------+ PERO     Full                                                        +---------+---------------+---------+-----------+----------+--------------+ Hematomas are noted along the incision site in the thigh area.  +---------+---------------+---------+-----------+----------+--------------+ LEFT     CompressibilityPhasicitySpontaneityPropertiesThrombus Aging +---------+---------------+---------+-----------+----------+--------------+ CFV      Full           Yes  Yes                                 +---------+---------------+---------+-----------+----------+--------------+ SFJ      Full                                                        +---------+---------------+---------+-----------+----------+--------------+ FV Prox  Full                                                        +---------+---------------+---------+-----------+----------+--------------+ FV Mid   Full                                                        +---------+---------------+---------+-----------+----------+--------------+ FV DistalFull                                                        +---------+---------------+---------+-----------+----------+--------------+ PFV      Full                                                        +---------+---------------+---------+-----------+----------+--------------+ POP      Full           Yes      Yes                                 +---------+---------------+---------+-----------+----------+--------------+ PTV      Full                                                        +---------+---------------+---------+-----------+----------+--------------+ PERO     Full                                                        +---------+---------------+---------+-----------+----------+--------------+     Summary: BILATERAL: - No evidence of deep vein thrombosis  seen in the lower extremities, bilaterally. -No evidence of popliteal cyst, bilaterally.   *See table(s) above for measurements and observations. Electronically signed by Servando Snare MD on 08/11/2020 at 4:56:30 PM.    Final    ECHOCARDIOGRAM LIMITED  Result Date: 08/15/2020    ECHOCARDIOGRAM LIMITED REPORT   Patient Name:   [facility] Date of Exam: 08/15/2020 Medical Rec #:  [id number]     Height:       73.0 in Accession #:    [id number]    Weight:       243.4 lb Date of Birth:  1951/01/16    BSA:          2.339 m Patient Age:    87 years      BP:           130/67 mmHg Patient Gender: M             HR:           80 bpm. Exam Location:  Inpatient Procedure: Limited Echo Indications:    CHF-Acute Systolic [id number]  History:        Patient has prior history of Echocardiogram examinations, most                 recent 08/10/2020. Signs/Symptoms:Fever. Cardiogenic shock:                 Ischemic cardiomyopathy, post-CABG on 4/26. Impella 5.5 down to                 P4. Thrombocytopenia, Anemia, AAA, Acute kidney injury.  Sonographer:    Darlina Sicilian RDCS Referring Phys: [place]  Conclusion(s)/Recommendation(s): LIMITED ECHO for Impella cannula placement. Left Ventricle: Left ventricular ejection fraction, by estimation, is <20%. The left ventricle has severely decreased function. Compared to prior, Similar cannular placement (5.2 cm from the annulus). Rudean Haskell MD Electronically signed by Rudean Haskell MD Signature Date/Time: 08/15/2020/11:01:39 AM    Final    ECHOCARDIOGRAM LIMITED  Result Date: 08/13/2020    ECHOCARDIOGRAM LIMITED REPORT   Patient Name:   [facility] [place] Date of Exam: 08/07/2020 Medical Rec #:  [id number]     Height:       73.0 in Accession #:    [id number]    Weight:       259.5 lb Date of Birth:  1950/07/05    BSA:          2.403 m Patient Age:    60 years      BP:           138/63 mmHg Patient Gender: M             HR:           74 bpm. Exam Location:  Inpatient  Procedure: Limited Echo Indications:    Impella positioning  History:        Patient has prior history of Echocardiogram examinations, most                 recent 08/03/2020. CHF and Cardiomyopathy, CAD, Prior CABG, PAD;                 Risk Factors:Hypertension. Cardiogenic shock.  Sonographer:    Dustin Flock Referring Phys: [id number] [place] ECHO for Impella cannula placement. Sanda Klein MD Electronically signed by Sanda Klein MD Signature Date/Time: 07/31/2020/9:06:09 AM    Final    ECHOCARDIOGRAM LIMITED  Result Date: 08/03/2020    ECHOCARDIOGRAM LIMITED REPORT   Patient Name:   [facility] [place] Date of Exam: 08/03/2020 Medical Rec #:  [id number]     Height:       73.0 in Accession #:    [id number]    Weight:       276.2 lb Date of Birth:  [birth year]    BSA:  2.468 m Patient Age:    80 years      BP:           118/79 mmHg Patient Gender: M             HR:           73 bpm. Exam Location:  Inpatient Procedure: Limited Echo and Color Doppler Indications:    I50.23 Acute on chronic systolic (congestive) heart failure  History:        Patient has prior history of Echocardiogram examinations, most                 recent 08/02/2020. COPD, Stroke and PAD; Risk                 Factors:Hypertension, Diabetes, Dyslipidemia and Sleep Apnea.                 Seizures. AAA. GERD.  Sonographer:    Jonelle Sidle Dance Referring Phys: [place]  1. Left ventricular ejection fraction, by estimation, is <20%. The left ventricle has severely decreased function. Limited echo for Impella positioning. Compared to prior study support device has been pulled out of the left ventircle. Measured at 4.75 cm from the annulus. FINDINGS  Left Ventricle: Left ventricular ejection fraction, by estimation, is <20%. The left ventricle has severely decreased function. Rudean Haskell MD Electronically signed by Rudean Haskell MD Signature Date/Time: 08/03/2020/5:58:54 PM    Final    [place] EKG  SITE RITE  Result Date: 08/15/2020 If Site Rite image not attached, placement could not be confirmed due to current cardiac rhythm.  CT CHEST ABDOMEN PELVIS WO CONTRAST  Result Date: 08/15/2020 CLINICAL DATA:  Sepsis, recent CABG EXAM: CT CHEST, ABDOMEN AND PELVIS WITHOUT CONTRAST TECHNIQUE: Multidetector CT imaging of the chest, abdomen and pelvis was performed following the standard protocol without IV contrast. COMPARISON:  09/28/2018 FINDINGS: CT CHEST FINDINGS Cardiovascular: Aortic atherosclerosis. Cardiomegaly status post median sternotomy and CABG. Extensive 3 vessel coronary artery calcifications and/or stents. Right subclavian approach Impella device position within the left ventricle. Right internal jugular and left subclavian venous catheters. No pericardial effusion. Mediastinum/Nodes: No enlarged mediastinal, hilar, or axillary lymph nodes. Tracheostomy. Thyroid and esophagus demonstrate no significant findings. Lungs/Pleura: Small, right greater than left bilateral pleural effusions and associated atelectasis or consolidation. Musculoskeletal: No chest wall mass or suspicious bone lesions identified. Status post median sternotomy and CABG with postoperative retrosternal stranding and fluid (series 3, image 26). There is no discrete fluid collection identified. Superficial metallic pellets in the left anterior chest wall. CT ABDOMEN PELVIS FINDINGS Hepatobiliary: No solid liver abnormality is seen. The gallbladder is mildly distended with some suggestion of wall thickening and dependent calcified sludge. No discrete gallstones identified. No biliary ductal dilatation. Pancreas: Unremarkable. No pancreatic ductal dilatation or surrounding inflammatory changes. Spleen: Normal in size without significant abnormality. Adrenals/Urinary Tract: Adrenal glands are unremarkable. Small nonobstructive calculus of the superior pole of the right kidney. No hydronephrosis. Bladder is decompressed by a Foley  catheter. Thickening of the urinary bladder wall. Stomach/Bowel: Stomach is within normal limits. Enteric feeding tube with tip in the duodenal bulb. Appendix is not clearly visualized. No evidence of bowel wall thickening, distention, or inflammatory changes. Rectal tube. Vascular/Lymphatic: Aortic atherosclerosis. Redemonstrated calcified aneurysm of the infrarenal abdominal aorta measuring 4.5 x 4.3 cm (series 3, image 80). No enlarged abdominal or pelvic lymph nodes. Reproductive: No mass or other abnormality. Other: No abdominal wall hernia or abnormality. Anasarca. Trace  ascites throughout the abdomen and pelvis. Musculoskeletal: No acute or significant osseous findings. There is a small hematoma within the left psoas muscle body, measuring approximately 5.5 x 3.6 x 3.1 cm (series 3, image 88, series 6, image 122). IMPRESSION: 1. Status post median sternotomy and CABG with postoperative retrosternal stranding and fluid. There is no discrete fluid collection or other evidence of postoperative complication identified. 2. The gallbladder is mildly distended with some suggestion of wall thickening and dependent calcified sludge. No discrete gallstones identified. If there is clinical concern for acute cholecystitis, right upper quadrant ultrasound or HIDA may be useful to further evaluate. 3. There is a small hematoma within the left psoas muscle body, measuring approximately 5.5 x 3.6 x 3.1 cm. 4. Small, right greater than left bilateral pleural effusions and associated atelectasis or consolidation. 5. Trace ascites and anasarca. 6. Extensive support apparatus as detailed above including tracheostomy and Impella device. 7. Redemonstrated calcified aneurysm of the infrarenal abdominal aorta measuring 4.5 x 4.3 cm. Recommend follow-up CT/MR every 6 months and vascular consultation when clinically appropriate. This recommendation follows ACR consensus guidelines: White Paper of the ACR Incidental Findings Committee  II on Vascular Findings. J Am Coll Radiol 2013; 10:789-794. Aortic Atherosclerosis (ICD10-I70.0). Electronically Signed   By: Eddie Candle M.D.   On: 08/15/2020 11:39   US Abdomen Limited RUQ (LIVER/GB)  Result Date: 08/16/2020 CLINICAL DATA:  Fever EXAM: ULTRASOUND ABDOMEN LIMITED RIGHT UPPER QUADRANT COMPARISON:  CT Aug 15, 2020 FINDINGS: Gallbladder: Sludge in a dilated gallbladder with wall thickening measuring 3.5 mm and trace pericholecystic fluid. No cholelithiasis visualized. Common bile duct: Diameter: 3 mm Liver: No focal lesion identified. Within normal limits in parenchymal echogenicity. Portal vein is patent on color Doppler imaging with normal direction of blood flow towards the liver. Other: None. IMPRESSION: 1. Sludge in a dilated gallbladder with wall thickening and trace pericholecystic fluid. No cholelithiasis visualized. Findings are suspicious for acute acalculous cholecystitis. Electronically Signed   By: Dahlia Bailiff MD   On: 08/16/2020 03:35    Microbiology No results found for this or any previous visit (from the past 240 hour(s)).  Lab Basic Metabolic Panel: No results for input(s): NA, K, CL, CO2, GLUCOSE, BUN, CREATININE, CALCIUM, MG, PHOS in the last 168 hours. Liver Function Tests: No results for input(s): AST, ALT, ALKPHOS, BILITOT, PROT, ALBUMIN in the last 168 hours. No results for input(s): LIPASE, AMYLASE in the last 168 hours. No results for input(s): AMMONIA in the last 168 hours. CBC: No results for input(s): WBC, NEUTROABS, HGB, HCT, MCV, PLT in the last 168 hours. Cardiac Enzymes: No results for input(s): CKTOTAL, CKMB, CKMBINDEX, TROPONINI in the last 168 hours. Sepsis Labs: No results for input(s): PROCALCITON, WBC, LATICACIDVEN in the last 168 hours.  Procedures/Operations  Coronary artery bypass grafting on 07/24/2020 Surgical reexploration on 07/19/2020 Removal of Impella device on 08/25/2020   [facility] 09/01/2020, 1:28 PM

## 2022-07-03 IMAGING — CR DG CHEST 2V
2 series · 2 of 2 positions shown · non-contrast
Comparison: 01/17/2019.

CLINICAL DATA: Shortness of breath.

EXAM:
CHEST - 2 VIEW

[chest pa]
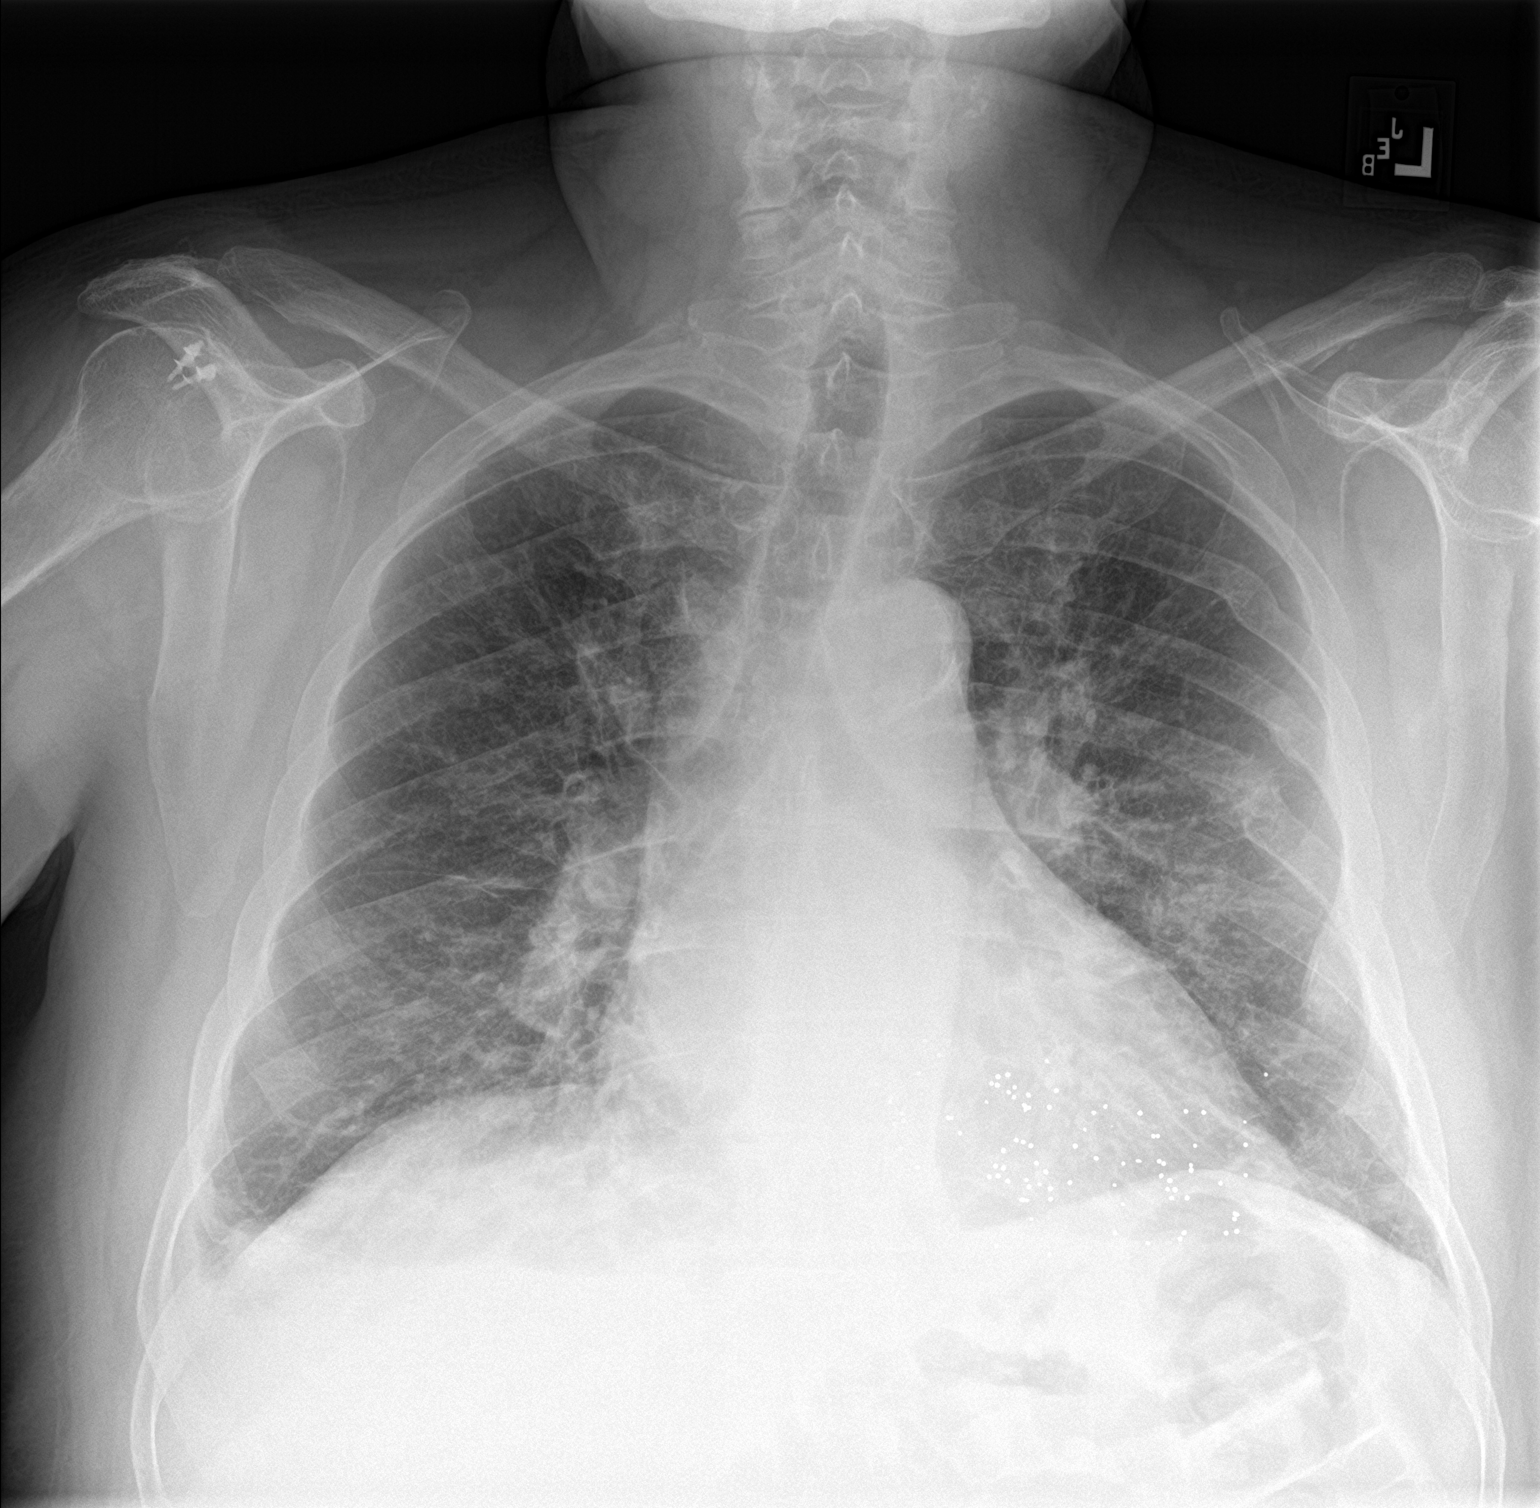

[chest lat]
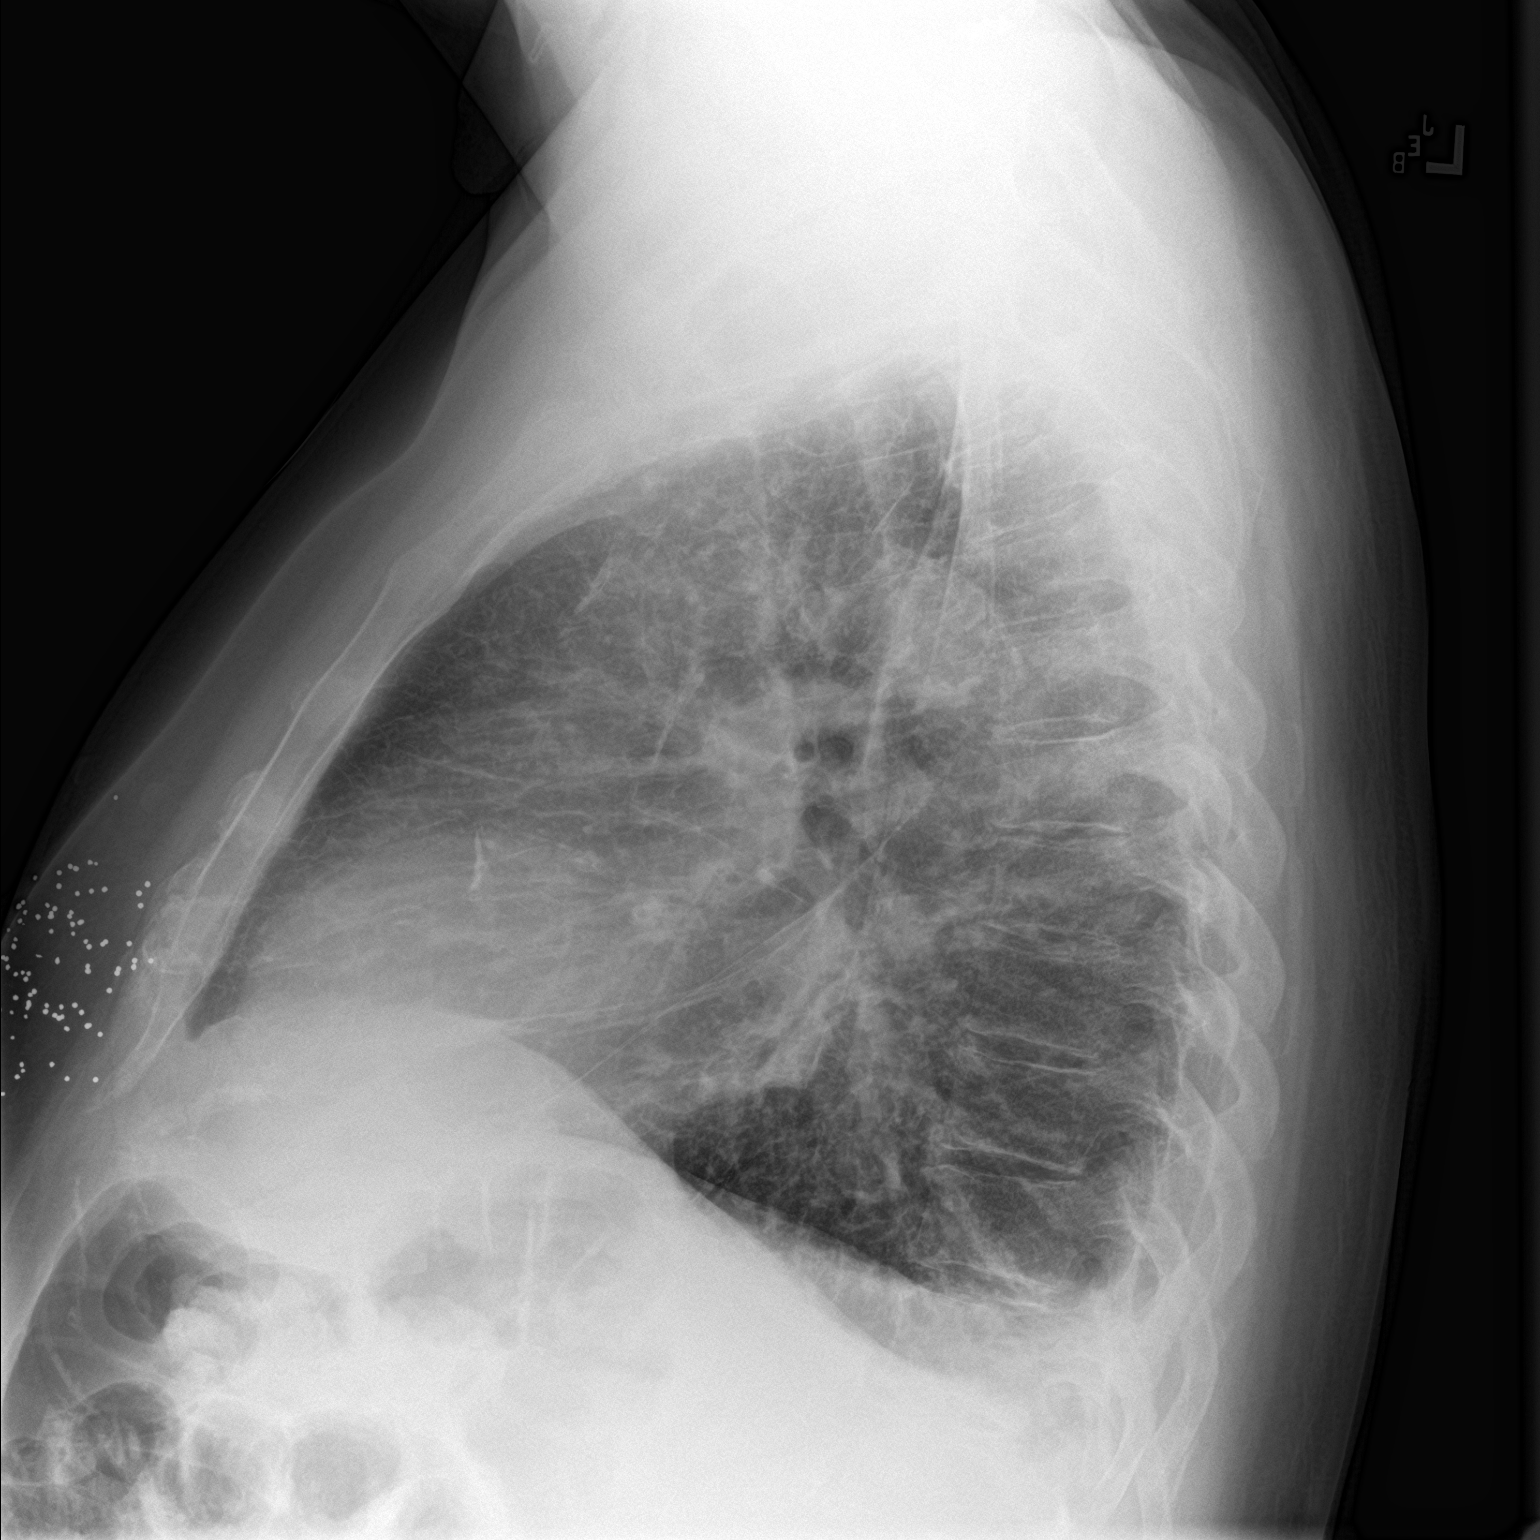

[2 of 2 positions shown; findings below may reference images not displayed]

FINDINGS: Cardiomegaly. Mild bilateral interstitial prominence suggesting
interstitial edema. Small right pleural effusion. No pneumothorax.
Metallic densities again noted the left chest. Postsurgical changes
right shoulder. Old left rib fractures again noted.
IMPRESSION: Cardiomegaly. Mild bilateral interstitial prominence suggesting
interstitial edema. Small right pleural effusion.

## 2022-07-04 IMAGING — CT CT CHEST W/O CM
2 of 3 series · 15 of 36 positions shown, 18 images · non-contrast
Comparison: 03/22/2019

CLINICAL DATA: Thoracic aortic Disease. Preoperative planning for
coronary artery bypass.

EXAM:
CT CHEST WITHOUT CONTRAST
TECHNIQUE: Multidetector CT imaging of the chest was performed following the
standard protocol without IV contrast.

[Series 3: chest w/o 2mm st · axial · non-contrast · 0.91mm/px · z∈[+1084,+1346]mm · 12 of 155 slices shown, 15 images]
[im 12/155  mediastinal]
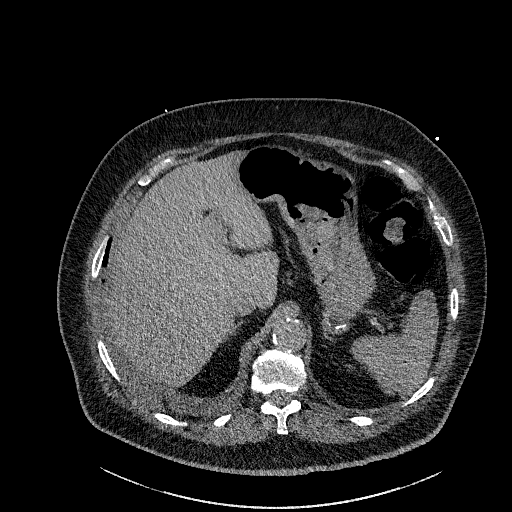
[im 12/155  lung]
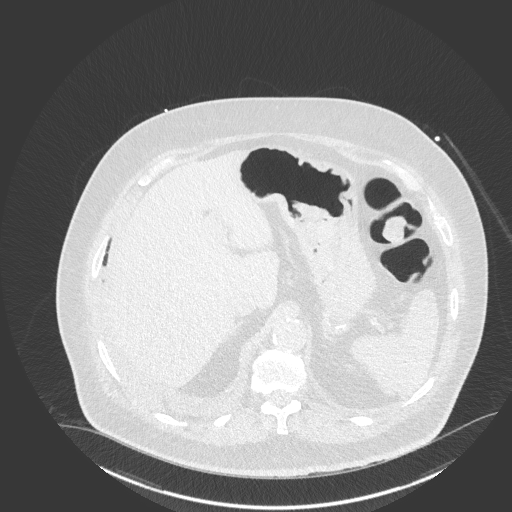
[im 23/155  lung]
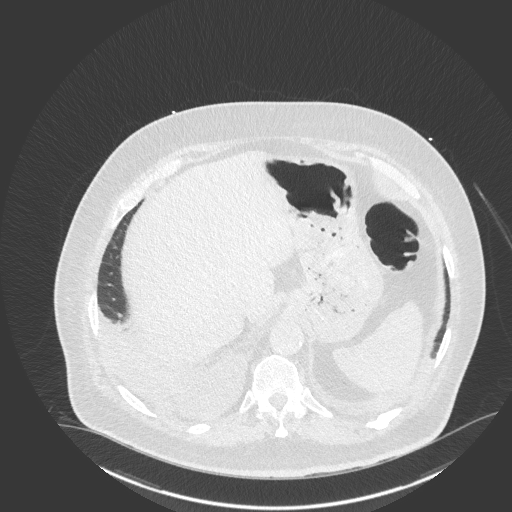
[im 35/155  lung]
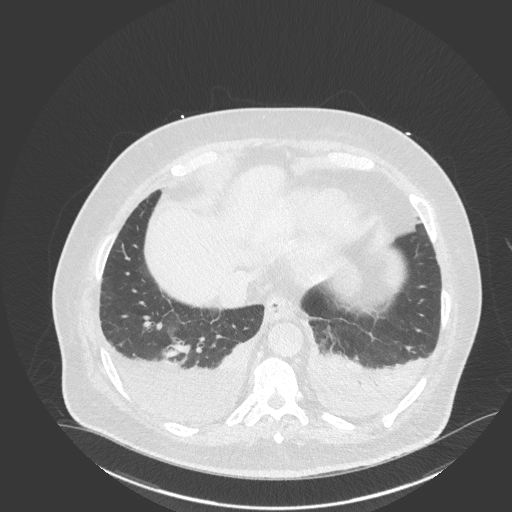
[im 46/155  lung]
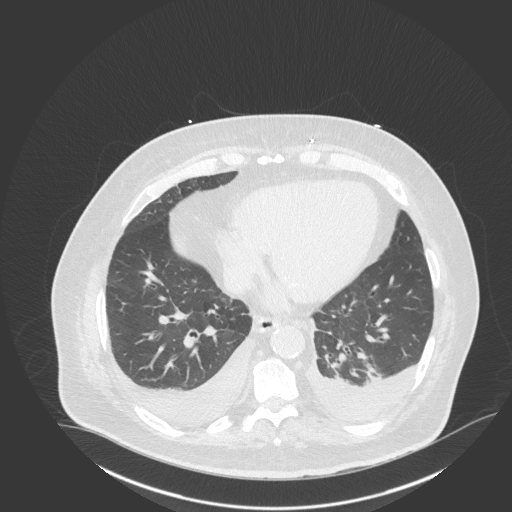
[im 58/155  mediastinal]
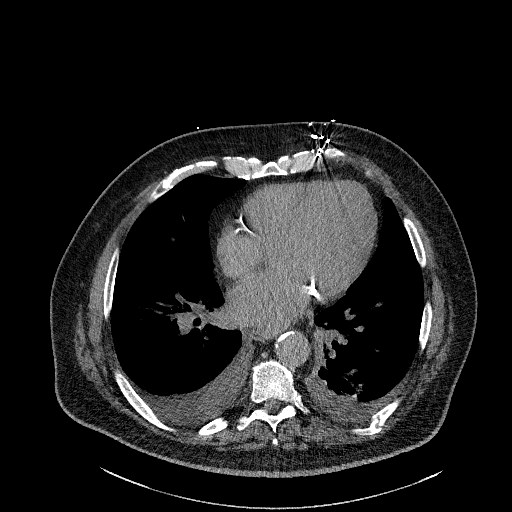
[im 58/155  lung]
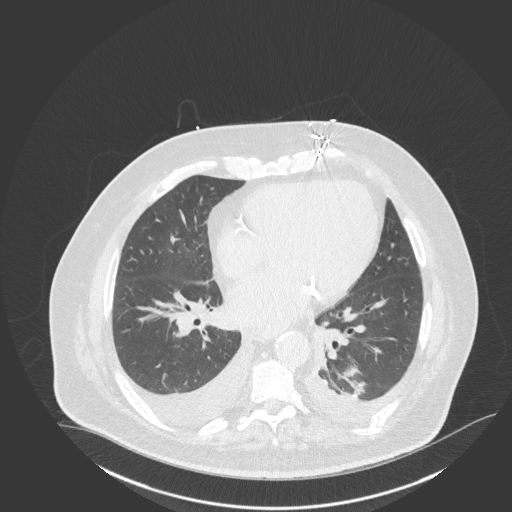
[im 69/155  lung]
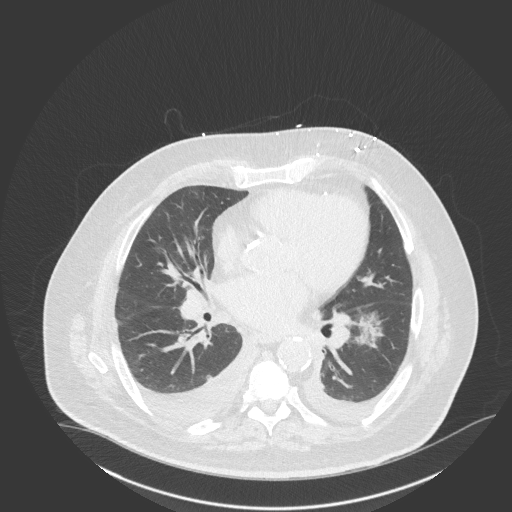
[im 86/155  lung]
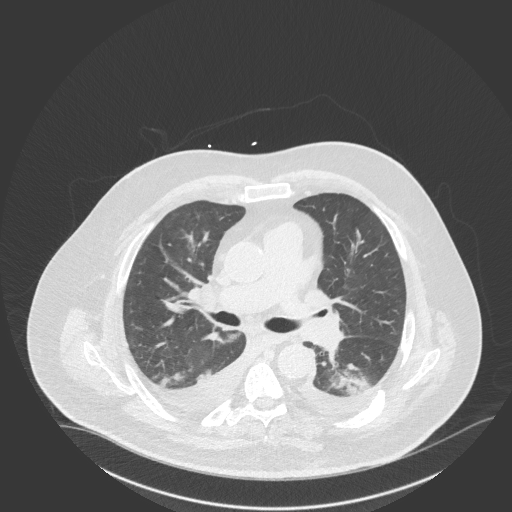
[im 97/155  lung]
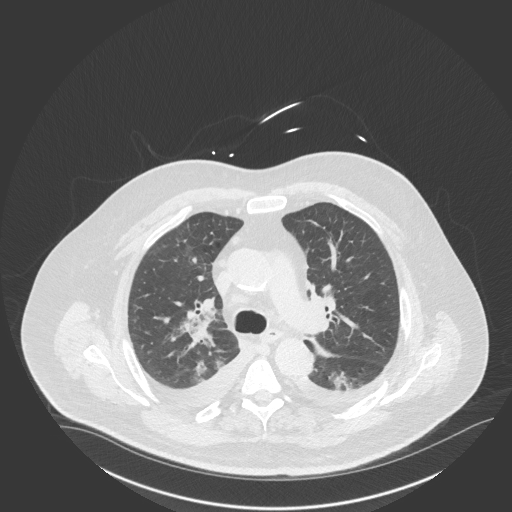
[im 109/155  mediastinal]
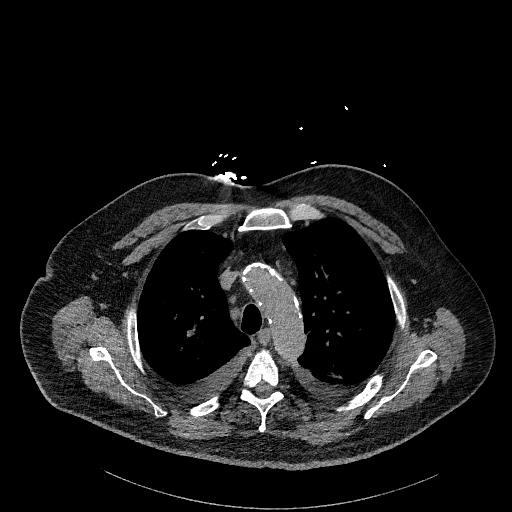
[im 109/155  lung]
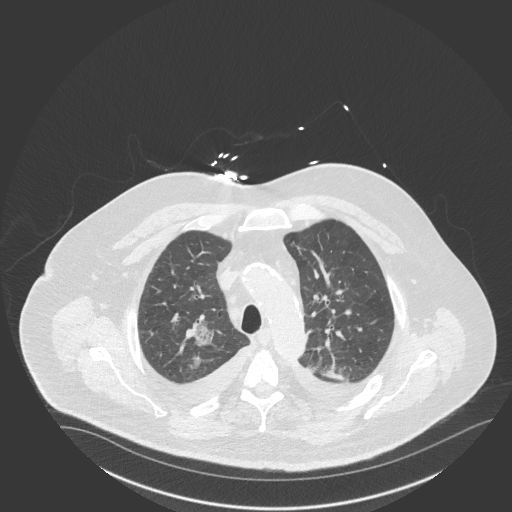
[im 120/155  lung]
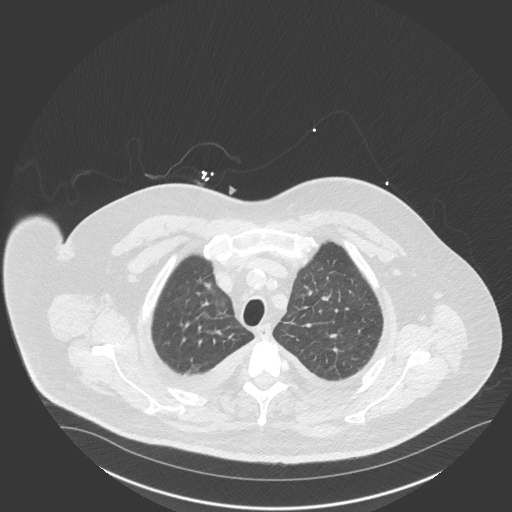
[im 132/155  lung]
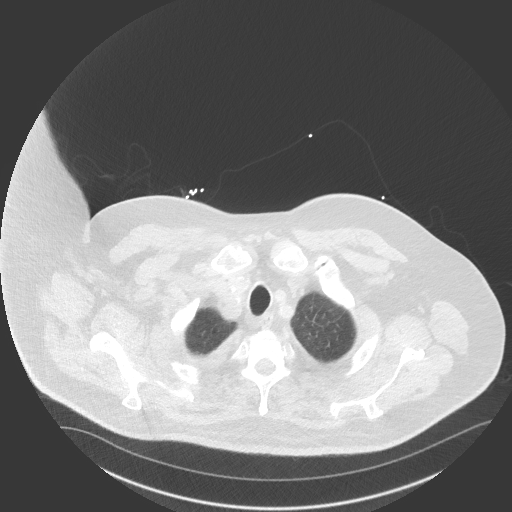
[im 143/155  lung]
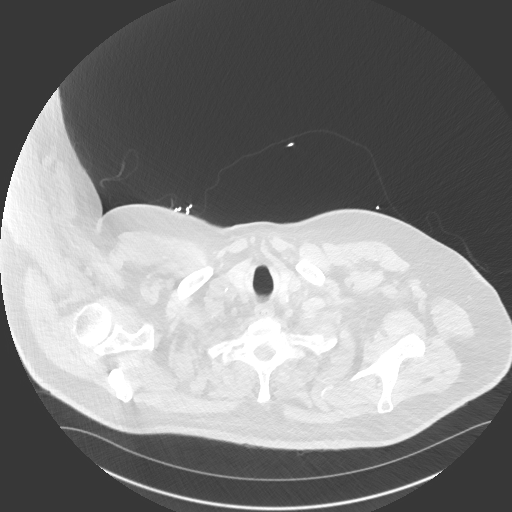

[Series 6: chest w/o 2mm st cor · coronal · non-contrast · 0.61mm/px · 3 of 183 slices shown]
[im 37/183  lung]
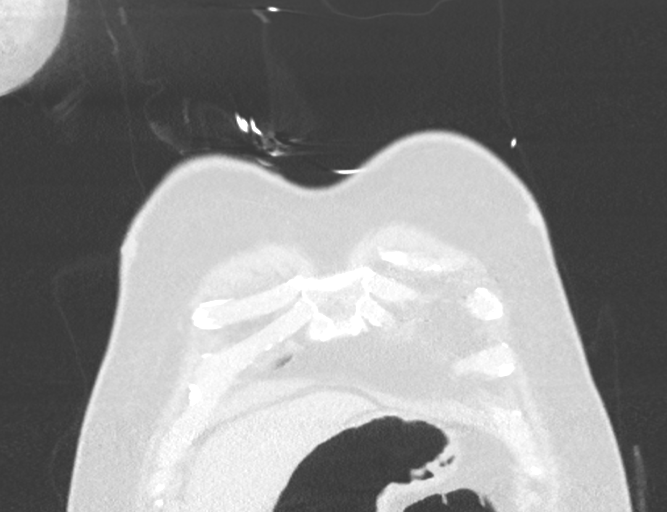
[im 73/183  lung]
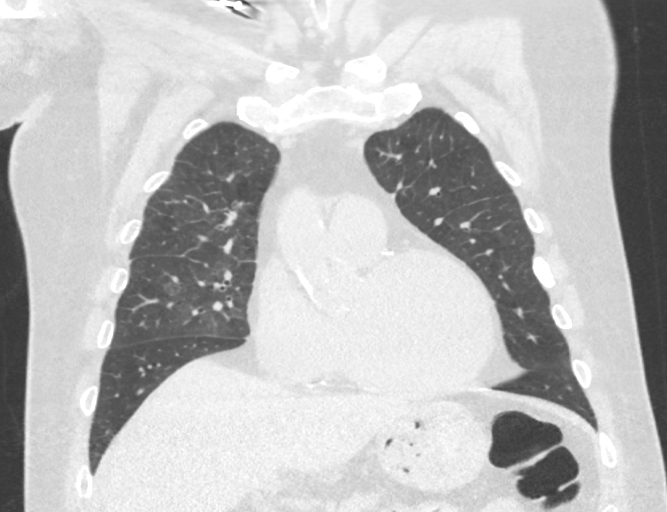
[im 110/183  lung]
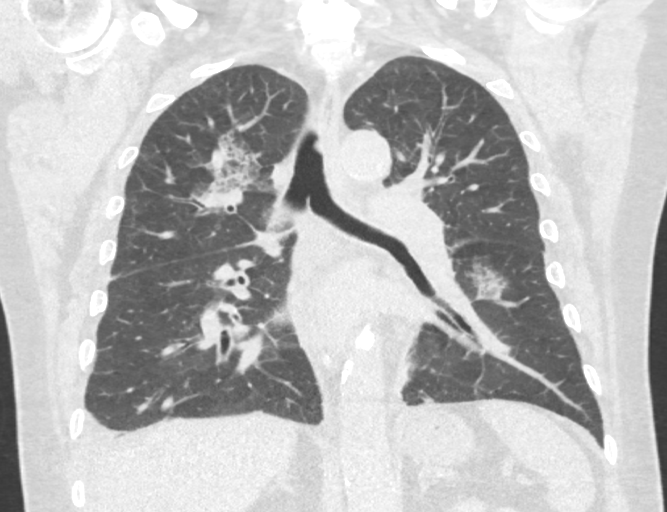

[15 of 36 positions shown; findings below may reference images not displayed]

FINDINGS: Cardiovascular: Mild cardiac enlargement. No pericardial effusions.
Calcification in the aorta, coronary arteries, mitral valve annulus.
Normal caliber thoracic aorta.

Mediastinum/Nodes: Prominent lymph nodes in the mediastinum without
pathologic enlargement, mildly increased since prior study.
Nonspecific but likely reactive. Esophagus is decompressed.

Lungs/Pleura: Small bilateral pleural effusions with basilar
atelectasis, new since prior study. Patchy airspace disease and
interstitial septal thickening throughout the lungs with a mostly
central distribution. This is new since prior study. Changes most
likely to represent multifocal pneumonia. No pneumothorax.

Upper Abdomen: No acute process demonstrated in the visualized upper
abdomen.

Musculoskeletal: Degenerative changes in the spine. Multiple old rib
fractures, some ununited.
IMPRESSION: 1. Mild cardiac enlargement.
2. Small bilateral pleural effusions with basilar atelectasis, new
since prior study.
3. Patchy airspace disease and interstitial septal thickening
throughout the lungs with a mostly central distribution. New since
prior study. Changes most likely to represent multifocal pneumonia.
Edema less likely.
4. Prominent lymph nodes in the mediastinum without pathologic
enlargement, mildly increased since prior study. Nonspecific but
likely reactive.
5. Aortic and coronary artery atherosclerosis.
6. Multiple old rib fractures, some ununited.

Aortic Atherosclerosis ([id number]).
